# Patient Record
Sex: Female | Born: 1945 | Race: Black or African American | Hispanic: Yes
Health system: Midwestern US, Community
[De-identification: ages and names within clinical notes are randomized; demographics above are authoritative.]

## PROBLEM LIST (undated history)

## (undated) DIAGNOSIS — I10 Essential (primary) hypertension: Secondary | ICD-10-CM

## (undated) DIAGNOSIS — R5381 Other malaise: Secondary | ICD-10-CM

## (undated) DIAGNOSIS — Z0000 Encounter for general adult medical examination without abnormal findings: Secondary | ICD-10-CM

## (undated) DIAGNOSIS — K219 Gastro-esophageal reflux disease without esophagitis: Secondary | ICD-10-CM

## (undated) DIAGNOSIS — T7840XA Allergy, unspecified, initial encounter: Secondary | ICD-10-CM

## (undated) DIAGNOSIS — N186 End stage renal disease: Secondary | ICD-10-CM

## (undated) DIAGNOSIS — I639 Cerebral infarction, unspecified: Secondary | ICD-10-CM

## (undated) DIAGNOSIS — Z9289 Personal history of other medical treatment: Secondary | ICD-10-CM

## (undated) DIAGNOSIS — I252 Old myocardial infarction: Secondary | ICD-10-CM

## (undated) DIAGNOSIS — E119 Type 2 diabetes mellitus without complications: Secondary | ICD-10-CM

## (undated) DIAGNOSIS — F0390 Unspecified dementia without behavioral disturbance: Secondary | ICD-10-CM

## (undated) DIAGNOSIS — E079 Disorder of thyroid, unspecified: Secondary | ICD-10-CM

## (undated) DIAGNOSIS — I509 Heart failure, unspecified: Secondary | ICD-10-CM

## (undated) DIAGNOSIS — N289 Disorder of kidney and ureter, unspecified: Secondary | ICD-10-CM

## (undated) DIAGNOSIS — N19 Unspecified kidney failure: Secondary | ICD-10-CM

## (undated) DIAGNOSIS — M1990 Unspecified osteoarthritis, unspecified site: Secondary | ICD-10-CM

## (undated) DIAGNOSIS — Z992 Dependence on renal dialysis: Secondary | ICD-10-CM

## (undated) DIAGNOSIS — Z931 Gastrostomy status: Secondary | ICD-10-CM

## (undated) DIAGNOSIS — D649 Anemia, unspecified: Secondary | ICD-10-CM

## (undated) DIAGNOSIS — M810 Age-related osteoporosis without current pathological fracture: Secondary | ICD-10-CM

## (undated) DIAGNOSIS — Z8673 Personal history of transient ischemic attack (TIA), and cerebral infarction without residual deficits: Secondary | ICD-10-CM

## (undated) HISTORY — DX: Heart failure, unspecified: I50.9

## (undated) HISTORY — DX: Type 2 diabetes mellitus without complications: E11.9

## (undated) HISTORY — DX: Unspecified kidney failure: N19

## (undated) HISTORY — DX: Personal history of transient ischemic attack (TIA), and cerebral infarction without residual deficits: Z86.73

## (undated) HISTORY — DX: Unspecified osteoarthritis, unspecified site: M19.90

## (undated) HISTORY — PX: ABDOMINAL HYSTERECTOMY: SHX81

## (undated) HISTORY — PX: GALLBLADDER SURGERY: SHX652

## (undated) HISTORY — DX: Anemia, unspecified: D64.9

## (undated) HISTORY — DX: Old myocardial infarction: I25.2

## (undated) HISTORY — DX: Allergy, unspecified, initial encounter: T78.40XA

## (undated) HISTORY — DX: Personal history of other medical treatment: Z92.89

## (undated) HISTORY — DX: Age-related osteoporosis without current pathological fracture: M81.0

---

## 2013-09-20 ENCOUNTER — Encounter (HOSPITAL_COMMUNITY): Payer: Self-pay | Admitting: Emergency Medicine

## 2013-09-20 ENCOUNTER — Emergency Department (HOSPITAL_COMMUNITY)
Admission: EM | Admit: 2013-09-20 | Discharge: 2013-09-20 | Disposition: A | Discharge status: Home / Self Care | Payer: Medicare Other | Attending: Emergency Medicine | Admitting: Emergency Medicine

## 2013-09-20 DIAGNOSIS — Z79899 Other long term (current) drug therapy: Secondary | ICD-10-CM | POA: Insufficient documentation

## 2013-09-20 DIAGNOSIS — E162 Hypoglycemia, unspecified: Secondary | ICD-10-CM

## 2013-09-20 DIAGNOSIS — E1169 Type 2 diabetes mellitus with other specified complication: Secondary | ICD-10-CM | POA: Insufficient documentation

## 2013-09-20 DIAGNOSIS — Z794 Long term (current) use of insulin: Secondary | ICD-10-CM | POA: Insufficient documentation

## 2013-09-20 HISTORY — DX: Type 2 diabetes mellitus without complications: E11.9

## 2013-09-20 LAB — GLUCOSE, CAPILLARY: Glucose-Capillary: 291 mg/dL — ABNORMAL HIGH (ref 70–99)

## 2013-09-20 NOTE — Discharge Instructions (Signed)

## 2013-09-20 NOTE — ED Notes (Signed)
Prior charting by Jorja Loaim , RN

## 2013-09-20 NOTE — ED Notes (Signed)
Her daughter found her mother's blood sugar to be low--called EMS.  They found her cbg to be 27 initially.  They gave her D50 IV and had her eat a peanut butter sandwich, which she was able to eat without issue.  She is awake, alert and orienteed x 4 and in no distress.

## 2013-09-20 NOTE — ED Notes (Signed)
Notified RN, Isaias CowmanAllan pt. CBG [phone].

## 2013-09-20 NOTE — ED Notes (Signed)
Bed: WA14 Expected date:  Expected time:  Means of arrival:  Comments: EMS 

## 2013-09-20 NOTE — ED Provider Notes (Signed)
CSN: [id number]     Arrival date & time 09/20/13  1859 History   First MD Initiated Contact with Patient 09/20/13 1921     Chief Complaint  Patient presents with  . Hypoglycemia   (Consider location/radiation/quality/duration/timing/severity/associated sxs/prior Treatment) HPI  Patient presents to the ER with complaints of hypoglycemia. She recently had her insulin increased because her sugars had been higher than normal. She is supposed to take 12 units before lunch and then 7 units before dinner. She took all 19 at once but did not eat very much today. The daughter came home and saw the she was sleepy and called EMS. When EMS arrived her glucose was was 27. She was given 1 amp of D50 and a peanut butter sandwich which she ate with no difficulties. She is alert and oriented without any complaints at this time.  Past Medical History  Diagnosis Date  . Diabetes mellitus without complication    History reviewed. No pertinent past surgical history. History reviewed. No pertinent family history. History  Substance Use Topics  . Smoking status: Never Smoker   . Smokeless tobacco: Not on file  . Alcohol Use: Not on file   OB History   Grav Para Term Preterm Abortions TAB SAB Ect Mult Living                 Review of Systems The patient denies anorexia, fever, weight loss,, vision loss, decreased hearing, hoarseness, chest pain, syncope, dyspnea on exertion, peripheral edema, balance deficits, hemoptysis, abdominal pain, melena, hematochezia, severe indigestion/heartburn, hematuria, incontinence, genital sores, muscle weakness, suspicious skin lesions, transient blindness, difficulty walking, depression, unusual weight change, abnormal bleeding, enlarged lymph nodes, angioedema, and breast masses.  Allergies  Review of patient's allergies indicates no known allergies.  Home Medications   Current Outpatient Rx  Name  Route  Sig  Dispense  Refill  . amLODipine (NORVASC) 10 MG tablet  Oral   Take 10-15.5 mg by mouth 2 (two) times daily. One in the morning and 1 and half tablets in the evening         . bisacodyl (DULCOLAX) 5 MG EC tablet   Oral   Take 5 mg by mouth daily as needed for moderate constipation.         . carvedilol (COREG) 25 MG tablet   Oral   Take 25 mg by mouth 2 (two) times daily with a meal.         . cholecalciferol (VITAMIN D) 1000 UNITS tablet   Oral   Take 1,000 Units by mouth daily.         Marland Kitchen. Cod Liver Oil 1000 MG CAPS   Oral   Take 1 capsule by mouth daily.         . insulin glargine (LANTUS) 100 UNIT/ML injection   Subcutaneous   Inject 25 Units into the skin at bedtime.         . insulin lispro (HUMALOG) 100 UNIT/ML injection   Subcutaneous   Inject 19 Units into the skin 2 (two) times daily. 12 units before lunch and 7 units before supper         . losartan-hydrochlorothiazide (HYZAAR) 100-25 MG per tablet   Oral   Take 1 tablet by mouth daily.          There were no vitals taken for this visit. Physical Exam  Nursing note and vitals reviewed. Constitutional: She is oriented to person, place, and time. She appears well-developed and well-nourished. No distress.  HENT:  Head: Normocephalic and atraumatic.  Eyes: Pupils are equal, round, and reactive to light.  Neck: Normal range of motion. Neck supple.  Cardiovascular: Normal rate and regular rhythm.   Pulmonary/Chest: Effort normal.  Abdominal: Soft.  Musculoskeletal: Normal range of motion.  Neurological: She is alert and oriented to person, place, and time. She has normal strength.  Skin: Skin is warm and dry.    ED Course  Procedures (including critical care time) Labs Review Labs Reviewed  GLUCOSE, CAPILLARY - Abnormal; Notable for the following:    Glucose-Capillary 291 (*)    All other components within normal limits   Imaging Review No results found.  EKG Interpretation   None       MDM   1. Hypoglycemia     Discussed case with  DR. Romeo Apple, we have a clear cause of why patient became hypoglycemic. She took a newer increased dose of insulin and did not eat today. She responded appropriately to therapy and now feels 100% fine with no complaints. Daughter and patient are both comfortable with patient going home. She has been monitored in the ED for 2 hours.  68 y.o.Angell Weninger's evaluation in the Emergency Department is complete. It has been determined that no acute conditions requiring further emergency intervention are present at this time. The patient/guardian have been advised of the diagnosis and plan. We have discussed signs and symptoms that warrant return to the ED, such as changes or worsening in symptoms.  Vital signs are stable at discharge. There were no vitals filed for this visit.  Patient/guardian has voiced understanding and agreed to follow-up with the PCP or specialist.     Dorthula Matas, PA-C 09/20/13 2041

## 2013-09-21 NOTE — ED Provider Notes (Signed)
Medical screening examination/treatment/procedure(s) were performed by non-physician practitioner and as supervising physician I was immediately available for consultation/collaboration.  EKG Interpretation   None         Junius ArgyleForrest S Marieelena Bartko, MD 09/21/13 1212

## 2013-11-08 ENCOUNTER — Emergency Department (HOSPITAL_COMMUNITY)
Admission: EM | Admit: 2013-11-08 | Discharge: 2013-11-08 | Disposition: A | Discharge status: Home / Self Care | Payer: Medicare Other | Attending: Emergency Medicine | Admitting: Emergency Medicine

## 2013-11-08 ENCOUNTER — Emergency Department (HOSPITAL_COMMUNITY): Payer: Medicare Other

## 2013-11-08 ENCOUNTER — Encounter (HOSPITAL_COMMUNITY): Payer: Self-pay | Admitting: Emergency Medicine

## 2013-11-08 DIAGNOSIS — M25539 Pain in unspecified wrist: Secondary | ICD-10-CM | POA: Insufficient documentation

## 2013-11-08 DIAGNOSIS — Z794 Long term (current) use of insulin: Secondary | ICD-10-CM | POA: Insufficient documentation

## 2013-11-08 DIAGNOSIS — J029 Acute pharyngitis, unspecified: Secondary | ICD-10-CM | POA: Insufficient documentation

## 2013-11-08 DIAGNOSIS — H9209 Otalgia, unspecified ear: Secondary | ICD-10-CM | POA: Insufficient documentation

## 2013-11-08 DIAGNOSIS — Z79899 Other long term (current) drug therapy: Secondary | ICD-10-CM | POA: Insufficient documentation

## 2013-11-08 DIAGNOSIS — E119 Type 2 diabetes mellitus without complications: Secondary | ICD-10-CM | POA: Insufficient documentation

## 2013-11-08 LAB — RAPID STREP SCREEN (MED CTR MEBANE ONLY): Streptococcus, Group A Screen (Direct): NEGATIVE

## 2013-11-08 MED ORDER — TRAMADOL HCL 50 MG PO TABS: 50.0000 mg | ORAL_TABLET | Freq: Four times a day (QID) | ORAL | Status: DC | PRN

## 2013-11-08 MED ORDER — TRAMADOL HCL 50 MG PO TABS
50.0000 mg | ORAL_TABLET | Freq: Once | ORAL | Status: AC
  Administered 2013-11-08: 50 mg via ORAL
  Filled 2013-11-08: qty 1

## 2013-11-08 NOTE — ED Provider Notes (Signed)
CSN: [id number]     Arrival date & time 11/08/13  1825 History  This chart was scribed for non-physician practitioner, Junious Silk, PA-C working with Laray Anger, DO by Luisa Dago, ED scribe. This patient was seen in room WTR8/WTR8 and the patient's care was started at 8:59 PM.      Chief Complaint  Patient presents with  . Wrist Pain    The history is provided by the patient. No language interpreter was used.   HPI Comments: Samantha [name] is a 68 y.o. female who presents to the Emergency Department complaining of right wrist pain that started 2 weeks ago. She describes the pain as achy and exacerbated with movement. Pt is currently in a splint that she bought herself. She denies any recent injury. Reports taking some arthritis medication (OTC-Equate). She is unsure which type of medication this is. Denies any similar episodes. Pt states that she thought that it may be arthritis. Denies any fever, chills, diaphoresis, abdominal pain, nausea, or emesis.  Pt is from [facility] but is in  during the week because she is taking care of her granddaughter.   Pt is requesting a strep test. She says that recently her granddaughter was diagnosed with Strep. She is complaining of a mild sore throat and mild left ear pain which started today. No fevers, abdominal pain, headache. Denies taking any OTC medication to reliever her symptoms.      Past Medical History  Diagnosis Date  . Diabetes mellitus without complication    No past surgical history on file. No family history on file. History  Substance Use Topics  . Smoking status: Never Smoker   . Smokeless tobacco: Not on file  . Alcohol Use: Not on file   OB History   Grav Para Term Preterm Abortions TAB SAB Ect Mult Living                 Review of Systems  Constitutional: Negative for fever, chills and diaphoresis.  HENT: Positive for ear pain and sore throat.   Respiratory: Negative for cough and shortness of breath.    Gastrointestinal: Negative for nausea, vomiting and abdominal pain.  Musculoskeletal: Positive for arthralgias (right wrist).  All other systems reviewed and are negative.      Allergies  Review of patient's allergies indicates no known allergies.  Home Medications   Current Outpatient Rx  Name  Route  Sig  Dispense  Refill  . amLODipine (NORVASC) 10 MG tablet   Oral   Take 10-15.5 mg by mouth 2 (two) times daily. One in the morning and 1 and half tablets in the evening         . bisacodyl (DULCOLAX) 5 MG EC tablet   Oral   Take 5 mg by mouth daily as needed for moderate constipation.         . carvedilol (COREG) 25 MG tablet   Oral   Take 25 mg by mouth 2 (two) times daily with a meal.         . cholecalciferol (VITAMIN D) 1000 UNITS tablet   Oral   Take 1,000 Units by mouth daily.         Marland Kitchen Cod Liver Oil 1000 MG CAPS   Oral   Take 1 capsule by mouth daily.         . insulin glargine (LANTUS) 100 UNIT/ML injection   Subcutaneous   Inject 25 Units into the skin at bedtime.         Marland Kitchen  insulin lispro (HUMALOG) 100 UNIT/ML injection   Subcutaneous   Inject 19 Units into the skin 2 (two) times daily. 12 units before lunch and 7 units before supper         . losartan-hydrochlorothiazide (HYZAAR) 100-25 MG per tablet   Oral   Take 1 tablet by mouth daily.          BP 191/100  Pulse 75  Temp(Src) 98.7 F (37.1 C) (Oral)  Resp 16  SpO2 97%  Physical Exam  Nursing note and vitals reviewed. Constitutional: She is oriented to person, place, and time. She appears well-developed and well-nourished. No distress.  HENT:  Head: Normocephalic and atraumatic.  Right Ear: Tympanic membrane, external ear and ear canal normal.  Left Ear: Tympanic membrane, external ear and ear canal normal.  Nose: Nose normal.  Mouth/Throat: Uvula is midline, oropharynx is clear and moist and mucous membranes are normal. No trismus in the jaw.  Eyes: Conjunctivae are  normal.  Neck: Normal range of motion.  Cardiovascular: Normal rate, regular rhythm and normal heart sounds.   Pulmonary/Chest: Effort normal and breath sounds normal. No stridor. No respiratory distress. She has no wheezes. She has no rales.  Abdominal: Soft. She exhibits no distension.  Musculoskeletal: Normal range of motion.  Tender to palpation over right distal radius. No snuff box tenderness.  Neurological: She is alert and oriented to person, place, and time. She has normal strength. No sensory deficit.  Reflex Scores:      Brachioradialis reflexes are 2+ on the right side and 2+ on the left side. Sensation intact to all fingers of the right hand. Grip strength 5/5 bilaterally.  Skin: Skin is warm and dry. She is not diaphoretic. No erythema.  Psychiatric: She has a normal mood and affect. Her behavior is normal.    ED Course  Procedures (including critical care time)  DIAGNOSTIC STUDIES: Oxygen Saturation is 97% on RA, normal by my interpretation.    COORDINATION OF CARE: 9:05 PM- Will give pt a referral to orthopedic.Pt advised of plan for treatment and pt agrees.  Labs Review Labs Reviewed  RAPID STREP SCREEN  CULTURE, GROUP A STREP   Imaging Review Dg Wrist Complete Right  11/08/2013   CLINICAL DATA:  Right wrist pain without known injury.  EXAM: RIGHT WRIST - COMPLETE 3+ VIEW  COMPARISON:  None.  FINDINGS: Spurring at the first carpometacarpal junction noted. Well corticated fragmented spur along the medial margin of the joint - the loose fragment within the joint not excluded.  Degenerative findings at the first metacarpophalangeal joint with some slight ulnar subluxation of the proximal phalanx with respect to the first metacarpal.  IMPRESSION: 1. Degenerative spurring at the first carpometacarpal joint, with a chronic appearing fragmented osteophyte medially which might be loose within the joint. 2. Degenerative findings at the first metacarpophalangeal joint.    Electronically Signed   By: Herbie Baltimore M.D.   On: 11/08/2013 19:24     EKG Interpretation None      MDM   Final diagnoses:  Wrist pain   Patient presents to ED with wrist pain. XR shows degenerative spurring at the first carpometacarpal joint, with chronic appearing fragmented osteophyte medially which might be loose within the joint. Also degenerative findings at the first metacarpophalangeal joint. This is likely contributing to her pain. Discussed importance of PCP follow up. Patient given a referral to hand.  Compartment soft. Neurovascularly intact. Return instructions given. Vital signs stable for discharge. Patient / Family /  Caregiver informed of clinical course, understand medical decision-making process, and agree with plan.  I personally performed the services described in this documentation, which was scribed in my presence. The recorded information has been reviewed and is accurate.    Mora BellmanHannah S Adison Reifsteck, PA-C 11/09/13 1523

## 2013-11-08 NOTE — Discharge Instructions (Signed)
Wrist Pain °A wrist sprain happens when the bands of tissue that hold the wrist joints together (ligament) stretch too much or tear. A wrist strain happens when muscles or bands of tissue that connect muscles to bones (tendons) are stretched or pulled. °HOME CARE °· Put ice on the injured area. °· Put ice in a plastic bag. °· Place a towel between your skin and the bag. °· Leave the ice on for 15-20 minutes, 03-04 times a day, for the first 2 days. °· Raise (elevate) the injured wrist to lessen puffiness (swelling). °· Rest the injured wrist for at least 48 hours or as told by your doctor. °· Wear a splint, cast, or an elastic wrap as told by your doctor. °· Only take medicine as told by your doctor. °· Follow up with your doctor as told. This is important. °GET HELP RIGHT AWAY IF:  °· The fingers are puffy, very red, white, or cold and blue. °· The fingers lose feeling (numb) or tingle. °· The pain gets worse. °· It is hard to move the fingers. °MAKE SURE YOU:  °· Understand these instructions. °· Will watch your condition. °· Will get help right away if you are not doing well or get worse. °Document Released: 01/27/2008 Document Revised: 11/02/2011 Document Reviewed: 10/01/2010 °Exit[employer]® Patient Information ©2014 [employer]. ° °

## 2013-11-08 NOTE — ED Notes (Signed)
Pt c/o rt wrist pain x 2 wks.  Denies injury.  Also wants to be checked for strep throat because her throat is starting to hurt and her grandchild was dx with strep.

## 2013-11-10 LAB — CULTURE, GROUP A STREP

## 2013-11-10 NOTE — ED Provider Notes (Signed)
Medical screening examination/treatment/procedure(s) were performed by non-physician practitioner and as supervising physician I was immediately available for consultation/collaboration.   EKG Interpretation None        Kunta Hilleary M Shivan Hodes, DO 11/10/13 1433 

## 2014-04-27 IMAGING — CR DG WRIST COMPLETE 3+V*R*
4 series · 4 of 4 positions shown · non-contrast
Comparison: None.

CLINICAL DATA: Right wrist pain without known injury.

EXAM:
RIGHT WRIST - COMPLETE 3+ VIEW

[x wrist pa right]
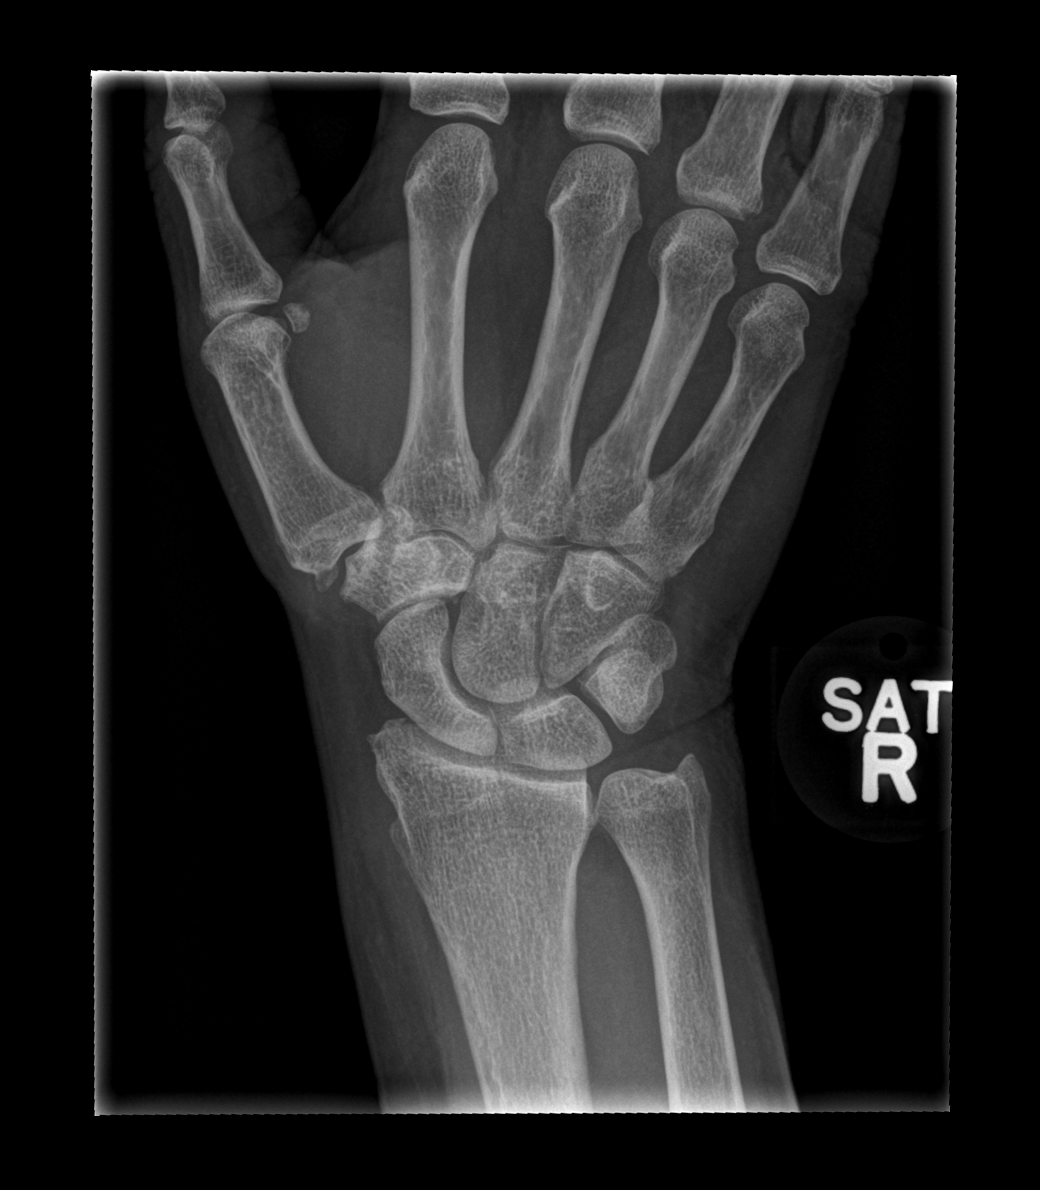

[x wrist obl right]
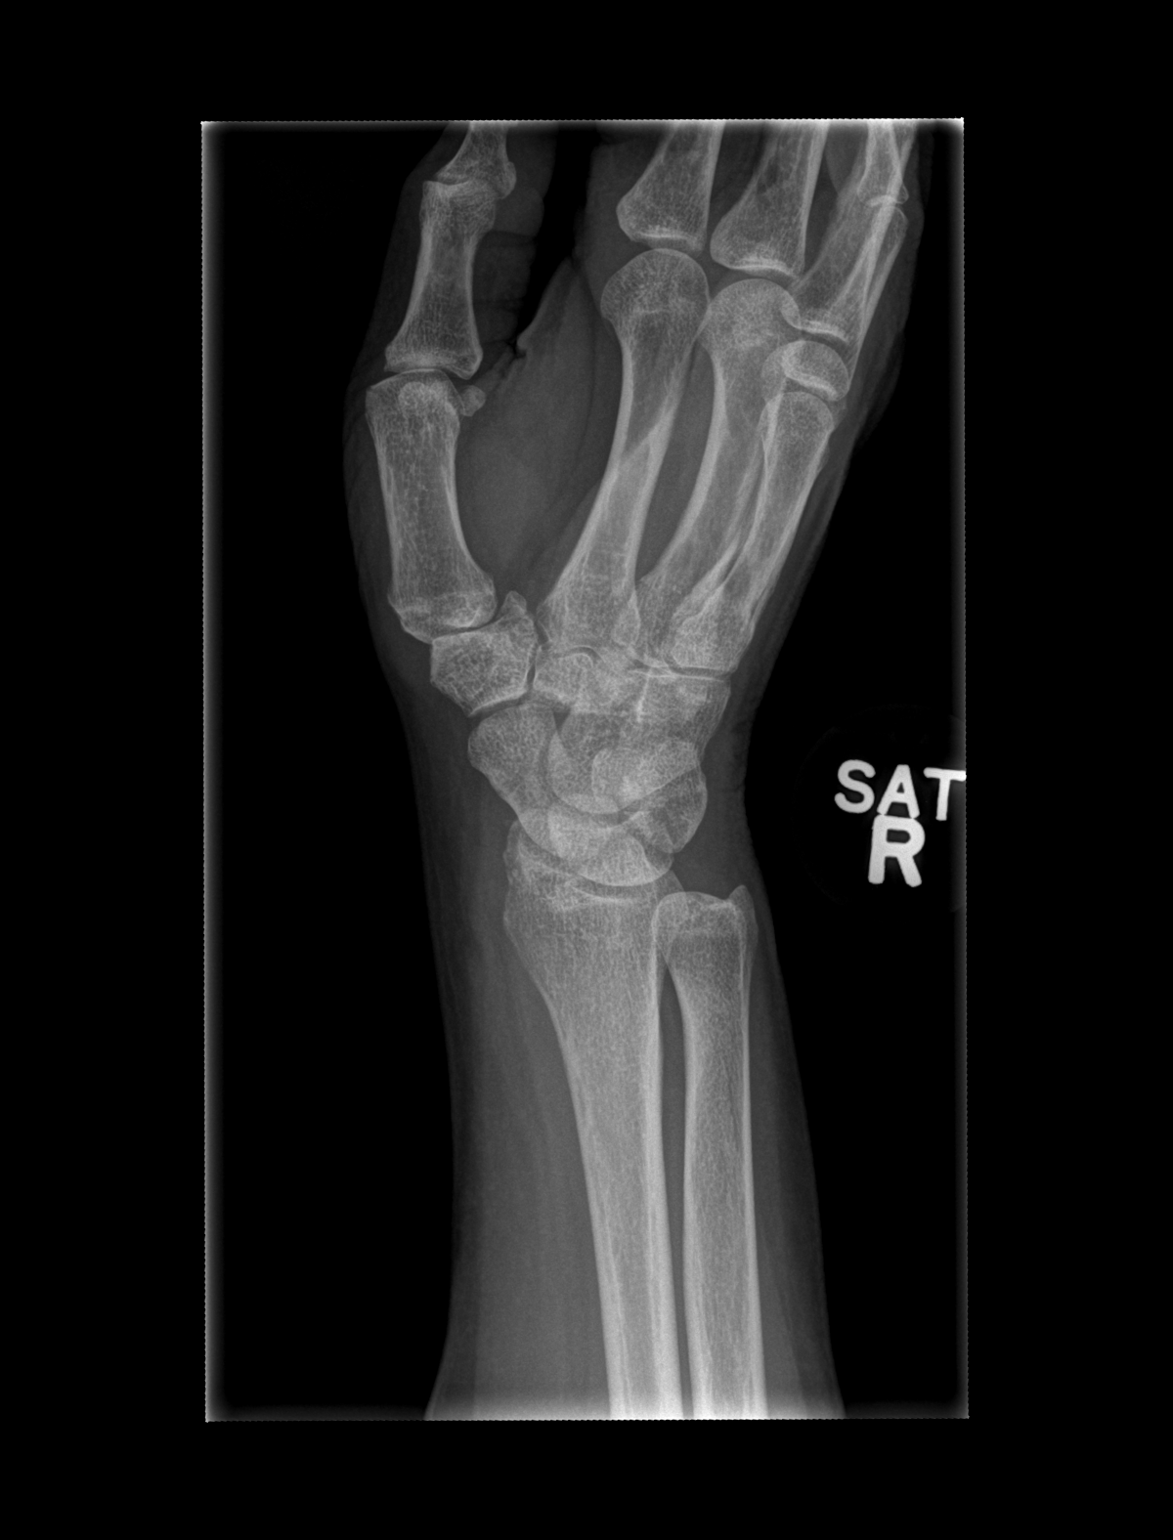

[x wrist lat right]
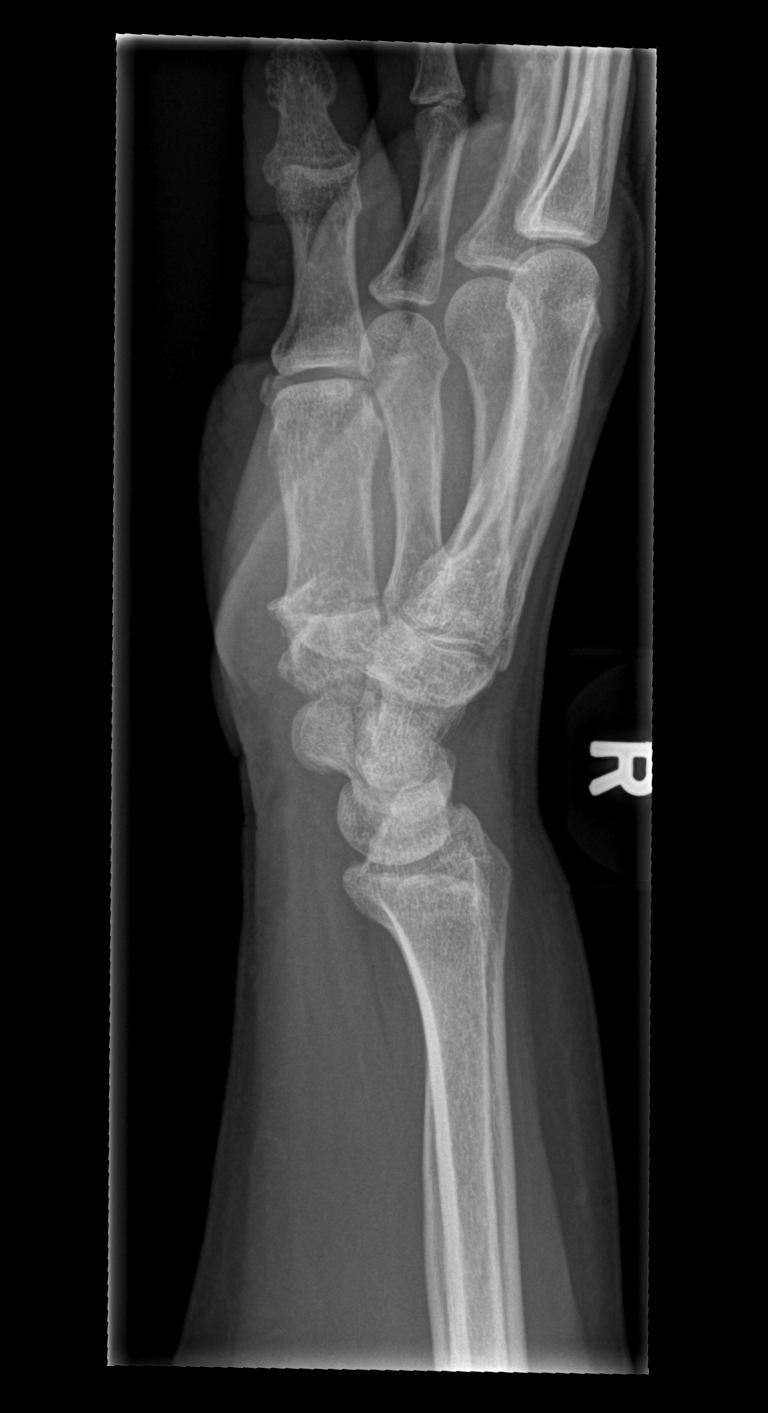

[x wrist navicular view right]
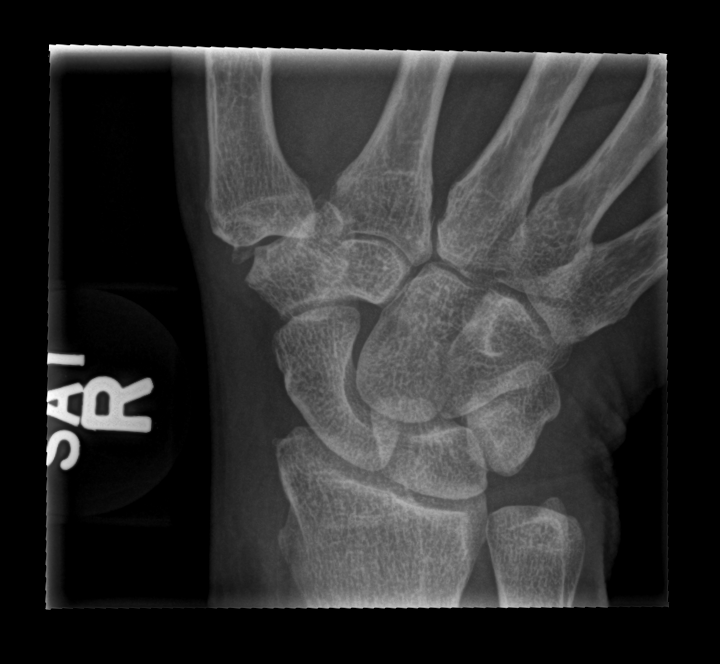

[4 of 4 positions shown; findings below may reference images not displayed]

FINDINGS: Spurring at the first carpometacarpal junction noted. Well
corticated fragmented spur along the medial margin of the joint -
the loose fragment within the joint not excluded.

Degenerative findings at the first metacarpophalangeal joint with
some slight ulnar subluxation of the proximal phalanx with respect
to the first metacarpal.
IMPRESSION: 1. Degenerative spurring at the first carpometacarpal joint, with a
chronic appearing fragmented osteophyte medially which might be
loose within the joint.
2. Degenerative findings at the first metacarpophalangeal joint.

## 2017-05-14 NOTE — Progress Notes (Signed)
L/M on Ashley Drake's phone to ask if she were new to us.  I am trying to work on a PA for her, not able to because I  Don't see any past history.  cbg

## 2017-05-18 ENCOUNTER — Ambulatory Visit: Admit: 2017-05-18 | Discharge: 2017-05-18 | Payer: MEDICARE | Attending: Family Medicine | Primary: Family Medicine

## 2017-05-18 DIAGNOSIS — I10 Essential (primary) hypertension: Secondary | ICD-10-CM

## 2017-05-18 LAB — AMB POC URINE, MICROALBUMIN, SEMIQUANT (3 RESULTS)
ALBUMIN, URINE POC: 150 mg/L
CREATININE, URINE POC: 100 mg/dL
Microalbumin/creat ratio (POC): >300 MG/G (ref <30)

## 2017-05-18 MED ORDER — ISOSORBIDE MONONITRATE SR 60 MG 24 HR TAB
60 mg | ORAL_TABLET | ORAL | 0 refills | Status: DC
Start: 2017-05-18 — End: 2017-10-04

## 2017-05-18 MED ORDER — CARVEDILOL 25 MG TAB
25 mg | ORAL_TABLET | Freq: Two times a day (BID) | ORAL | 0 refills | Status: DC
Start: 2017-05-18 — End: 2017-07-09

## 2017-05-18 MED ORDER — AMLODIPINE 10 MG TAB
10 mg | ORAL_TABLET | Freq: Every day | ORAL | 0 refills | Status: DC
Start: 2017-05-18 — End: 2017-07-27

## 2017-05-18 MED ORDER — PANTOPRAZOLE 40 MG TAB, DELAYED RELEASE
40 mg | ORAL_TABLET | Freq: Every day | ORAL | 0 refills | Status: DC
Start: 2017-05-18 — End: 2018-02-01

## 2017-05-18 NOTE — Progress Notes (Signed)
Subjective:      Ashley Drake is a 71 y.o. female with history of hypertension, DM, TTP, CVA who presents for initial visit.   Comes in with her daughter's today.   Issues discussed today     Hypertension   Is currently on Amlodipine 10 mg and Coreg 25 mg daily  Does measure BP at home - does not bring in log today.   Diet  Does try and follow a low salt diet   Exercise - does try and walk daily   Tolerating medications well, has not noticed any side effects.   Sees Dr Noland Fordyce, Cardiology       DM   Is currently on Novolog sliding scale   Doe not recall her last A1c   Is not on any oral medication for DM.     TTP   Followed by Dr Carmell Austria, Hematology   Is currently on Prednisone.   Last appointment - Sept 21st      [facility] discharged from [facility].   Had been admitted to [facility] for CVA.           Review of Systems   Constitutional: Negative for chills and fever.   HENT: Negative for ear discharge and ear pain.    Respiratory: Negative for cough and shortness of breath.    Cardiovascular: Negative for chest pain and palpitations.   Gastrointestinal: Negative for abdominal pain and vomiting.   Genitourinary: Negative for dysuria and frequency.   Skin: Negative for itching and rash.   Neurological: Negative for dizziness and headaches.         PMHx:  Past Medical History:   Diagnosis Date   ??? Diabetes (HCC)    ??? Hypertension    ??? T.T.P. syndrome (HCC)        Meds:       Allergies:   Not on File    Smoker:  History   Smoking Status   ??? Never Smoker   Smokeless Tobacco   ??? Never Used       ETOH:   History   Alcohol Use No       FH: No family history on file.      Objective:     Visit Vitals   ??? BP 115/66 (BP 1 Location: Left arm, BP Patient Position: Sitting)   ??? Pulse 75   ??? Temp 98.4 ??F (36.9 ??C) (Oral)   ??? Resp 18   ??? Ht  (1.499 m)   ??? Wt 108 lb 12.8 oz (49.4 kg)   ??? SpO2 99%   ??? BMI 21.97 kg/m2       Physical Examination:   GEN: No apparent distress. Alert and oriented and responds to all  questions appropriately. In wheelchair  OROPHYARYNX: No oral lesions or exudates.  NECK:  Supple; no masses; thyroid normal           LUNGS: Respirations unlabored; clear to auscultation bilaterally  CARDIOVASCULAR: Regular, rate, and rhythm without murmurs, gallops or rubs   ABDOMEN: Soft; nontender; nondistended;  SKIN: No obvious rashes.      Assessment:       ICD-10-CM ICD-9-CM    1. Essential hypertension I10 401.9 amLODIPine (NORVASC) 10 mg tablet      carvedilol (COREG) 25 mg tablet      CBC W/O DIFF      METABOLIC PANEL, COMPREHENSIVE      HEMOGLOBIN A1C WITH EAG      AMB POC URINE, MICROALBUMIN,  SEMIQUANT (3 RESULTS)      CANCELED: LIPID PANEL   2. Type 2 diabetes mellitus with complication, with long-term current use of insulin (HCC) E11.8 250.90 HEMOGLOBIN A1C WITH EAG    Z79.4 V58.67    3. Gastroesophageal reflux disease without esophagitis K21.9 530.81 pantoprazole (PROTONIX) 40 mg tablet      isosorbide mononitrate ER (IMDUR) 60 mg CR tablet           Plan:       - Follow up labs as above   - Continue Amlodipine and Coreg for BP   - Counseled on DASH diet and exercise   - Will obtain records from prior provider and recent hospital stay   - Continue sliding scale for DM - will make adjustment based on labs   - Continue Protonix, counseled on side effects of medication       Patient is counseled to return to the office if symptoms do not improve as expected.  Urgent consultation with the nearest Emergency Department is strongly recommended if condition worsens.  Patient is counseled to follow up as recommended and to inform the office if any changes in treatment are recommended.            Signed By:  Leanora Ivanoff, MD    Family Medicine Resident

## 2017-05-18 NOTE — Progress Notes (Signed)
I reviewed with the resident the medical history and the resident's findings on the physical examination.  I discussed with the resident the patient's diagnosis and concur with the plan.

## 2017-05-18 NOTE — Telephone Encounter (Signed)
As patient was leaving office today she and her daughter asked if their was any alternative to sticking her finger multiple times a day for her blood sugars. She states that patient used to wear a wrist band? that checked her sugars so her fingers wouldn't get stuck constantly. I told her I would have to ask Dr. Raleigh CallasSharma. Please let me know if I need to contact patient with anymore information.

## 2017-05-18 NOTE — Progress Notes (Signed)
Ashley Drake is a 71 y.o. female  Chief Complaint   Patient presents with   ??? Establish Care     states previously seeing a provider in [place]   ??? Medication Refill     Visit Vitals   ??? BP 115/66 (BP 1 Location: Left arm, BP Patient Position: Sitting)   ??? Pulse 75   ??? Temp 98.4 ??F (36.9 ??C) (Oral)   ??? Resp 18   ??? Ht 4\' 11"  (1.499 m)   ??? Wt 108 lb 12.8 oz (49.4 kg)   ??? SpO2 99%   ??? BMI 21.97 kg/m2     1. Have you been to the ER, urgent care clinic since your last visit?  Hospitalized since your last visit? No    2. Have you seen or consulted any other health care providers outside of the [facility] since your last visit?  Include any pap smears or colon screening. Seeing doctor for TTP.  Health Maintenance Due   Topic Date Due   ??? Hepatitis C Screening  12/22/1945   ??? DTaP/Tdap/Td series (1 - Tdap) 09/05/1967   ??? Shingrix Vaccine Age 62> (1 of 2) 09/04/1996   ??? BREAST CANCER SCRN MAMMOGRAM  09/04/1996   ??? FOBT Q 1 YEAR AGE 38-75  09/04/1996   ??? GLAUCOMA SCREENING Q2Y  09/05/2011   ??? Bone Densitometry (Dexa) Screening  09/05/2011   ??? Pneumococcal 65+ Low/Medium Risk (1 of 2 - PCV13) 09/05/2011   ??? Influenza Age 78 to Adult  03/24/2017

## 2017-05-19 LAB — METABOLIC PANEL, COMPREHENSIVE
A-G Ratio: 1.7 (ref 1.2–2.2)
ALT (SGPT): 12 IU/L (ref 0–32)
AST (SGOT): 27 IU/L (ref 0–40)
Albumin: 3.8 g/dL (ref 3.5–4.8)
Alk. phosphatase: 53 IU/L (ref 39–117)
BUN/Creatinine ratio: 16 (ref 12–28)
BUN: 39 mg/dL — ABNORMAL HIGH (ref 8–27)
Bilirubin, total: <0.2 mg/dL (ref 0.0–1.2)
CO2: 26 mmol/L (ref 20–29)
Calcium: 9.5 mg/dL (ref 8.7–10.3)
Chloride: 105 mmol/L (ref 96–106)
Creatinine: 2.43 mg/dL — ABNORMAL HIGH (ref 0.57–1.00)
GFR est AA: 23 mL/min/{1.73_m2} — ABNORMAL LOW (ref >59)
GFR est non-AA: 20 mL/min/{1.73_m2} — ABNORMAL LOW (ref >59)
GLOBULIN, TOTAL: 2.2 g/dL (ref 1.5–4.5)
Glucose: 123 mg/dL — ABNORMAL HIGH (ref 65–99)
Potassium: 4.4 mmol/L (ref 3.5–5.2)
Protein, total: 6 g/dL (ref 6.0–8.5)
Sodium: 145 mmol/L — ABNORMAL HIGH (ref 134–144)

## 2017-05-19 LAB — CBC W/O DIFF
HCT: 35.3 % (ref 34.0–46.6)
HGB: 11.2 g/dL (ref 11.1–15.9)
MCH: 26.4 pg — ABNORMAL LOW (ref 26.6–33.0)
MCHC: 31.7 g/dL (ref 31.5–35.7)
MCV: 83 fL (ref 79–97)
PLATELET: 152 10*3/uL (ref 150–379)
RBC: 4.24 x10E6/uL (ref 3.77–5.28)
RDW: 15.8 % — ABNORMAL HIGH (ref 12.3–15.4)
WBC: 4.8 10*3/uL (ref 3.4–10.8)

## 2017-05-19 LAB — CKD REPORT

## 2017-05-19 LAB — HEMOGLOBIN A1C WITH EAG
Estimated average glucose: 120 mg/dL
Hemoglobin A1c: 5.8 % — ABNORMAL HIGH (ref 4.8–5.6)

## 2017-05-19 LAB — DIABETES PATIENT EDUCATION

## 2017-05-23 NOTE — Telephone Encounter (Signed)
Daughter called in to say Mother BG reading was 434 in which she was given 18 units of insulin (Novolog Flex pen) rechecked an hour after down to 371 and rechecked again after two hours 178. Daughter Burman Blacksmith) then states that patient didn't have a prescription for her Novolog Pen, Reli-on Strips and needles, folic acid, Magoxid 400 mg twice daily, and was unable to get Isosorbide 60 mg. Called the pharmacy and Isosorbide is ready for pick up now.

## 2017-05-24 ENCOUNTER — Telehealth

## 2017-05-24 MED ORDER — MAGNESIUM OXIDE 400 MG TAB
400 mg | ORAL_TABLET | Freq: Two times a day (BID) | ORAL | 0 refills | Status: DC
Start: 2017-05-24 — End: 2017-10-04

## 2017-05-24 MED ORDER — VITAMIN B COMPLEX #3-FOLIC ACID-VIT C-BIOTIN 1 MG-60 MG-300 MCG TAB
1-60-300 mg-mg-mcg | ORAL_TABLET | Freq: Every day | ORAL | 0 refills | Status: AC
Start: 2017-05-24 — End: ?

## 2017-05-24 MED ORDER — NOVOLOG FLEXPEN U-100 INSULIN ASPART 100 UNIT/ML (3 ML) SUBCUTANEOUS
100 unit/mL (3 mL) | SUBCUTANEOUS | 1 refills | Status: DC
Start: 2017-05-24 — End: 2017-05-25

## 2017-05-24 MED ORDER — BLOOD SUGAR DIAGNOSTIC TEST STRIPS
ORAL_STRIP | 2 refills | Status: DC
Start: 2017-05-24 — End: 2017-05-25

## 2017-05-24 NOTE — Telephone Encounter (Signed)
05/19/2017 effective: 05/19/2017 expires: 05/18/2018] Carson Myrtle [phone]

## 2017-05-24 NOTE — Telephone Encounter (Signed)
-----   [facility] sent at 05/24/2017 12:12 PM EDT -----  Regarding: Dr. Charlynn GrimesSharma/Telephone  Pt daughter Renelda MomDori Kasper [phone] says that the pt [facility] for PT, ST, OT. She says that [facility] that she [facility] this for the new month.

## 2017-05-24 NOTE — Telephone Encounter (Signed)
Hi Dr. Raleigh Callas,  The insurance company that authorizes her Procrit has been rejected.  Please fax to [facility] something comparable.  Thx,  Dorean Daniello

## 2017-05-26 MED ORDER — NOVOLOG FLEXPEN U-100 INSULIN ASPART 100 UNIT/ML (3 ML) SUBCUTANEOUS
100 unit/mL (3 mL) | SUBCUTANEOUS | 1 refills | Status: DC
Start: 2017-05-26 — End: 2017-06-24

## 2017-05-26 MED ORDER — BLOOD SUGAR DIAGNOSTIC TEST STRIPS
ORAL_STRIP | 2 refills | Status: AC
Start: 2017-05-26 — End: ?

## 2017-05-26 NOTE — Telephone Encounter (Signed)
Called and discussed labs with daughter.   Will refer to Dr Westley Chandler, Nephrology for CKD. Daughter will call to make an appointment.   Follow up as scheduled.

## 2017-05-26 NOTE — Progress Notes (Signed)
Elevated Cr  A1c-5.8

## 2017-05-27 NOTE — Telephone Encounter (Signed)
The insurance company that authorizes her Procrit has been rejected.  [facility].  Thx,  Caelin Rayl   ??

## 2017-06-02 ENCOUNTER — Ambulatory Visit: Admit: 2017-06-02 | Discharge: 2017-06-02 | Payer: MEDICARE | Attending: Family Medicine | Primary: Family Medicine

## 2017-06-02 DIAGNOSIS — IMO0001 Reserved for inherently not codable concepts without codable children: Secondary | ICD-10-CM

## 2017-06-02 NOTE — Progress Notes (Signed)
I reviewed with the resident the medical history and the resident's findings on the physical examination.  I discussed with the resident the patient's diagnosis and concur with the plan.

## 2017-06-02 NOTE — Patient Instructions (Signed)
Learning About Diabetes Food Guidelines  Your Care Instructions    Meal planning is important to manage diabetes. It helps keep your blood sugar at a target level (which you set with your doctor). You don't have to eat special foods. You can eat what your family eats, including sweets once in a while. But you do have to pay attention to how often you eat and how much you eat of certain foods.  You may want to work with a dietitian or a certified diabetes educator (CDE) to help you plan meals and snacks. A dietitian or CDE can also help you lose weight if that is one of your goals.  What should you know about eating carbs?  Managing the amount of carbohydrate (carbs) you eat is an important part of healthy meals when you have diabetes. Carbohydrate is found in many foods.  ?? Learn which foods have carbs. And learn the amounts of carbs in different foods.  ?? Bread, cereal, pasta, and rice have about 15 grams of carbs in a serving. A serving is 1 slice of bread (1 ounce), ?? cup of cooked cereal, or 1/3 cup of cooked pasta or rice.  ?? Fruits have 15 grams of carbs in a serving. A serving is 1 small fresh fruit, such as an apple or orange; ?? of a banana; ?? cup of cooked or canned fruit; ?? cup of fruit juice; 1 cup of melon or raspberries; or 2 tablespoons of dried fruit.  ?? Milk and no-sugar-added yogurt have 15 grams of carbs in a serving. A serving is 1 cup of milk or 2/3 cup of no-sugar-added yogurt.  ?? Starchy vegetables have 15 grams of carbs in a serving. A serving is ?? cup of mashed potatoes or sweet potato; 1 cup winter squash; ?? of a small baked potato; ?? cup of cooked beans; or ?? cup cooked corn or green peas.  ?? Learn how much carbs to eat each day and at each meal. A dietitian or CDE can teach you how to keep track of the amount of carbs you eat. This is called carbohydrate counting.  ?? If you are not sure how to count carbohydrate grams, use the Plate  Method to plan meals. It is a good, quick way to make sure that you have a balanced meal. It also helps you spread carbs throughout the day.  ?? Divide your plate by types of foods. Put non-starchy vegetables on half the plate, meat or other protein food on one-quarter of the plate, and a grain or starchy vegetable in the final quarter of the plate. To this you can add a small piece of fruit and 1 cup of milk or yogurt, depending on how many carbs you are supposed to eat at a meal.  ?? Try to eat about the same amount of carbs at each meal. Do not "save up" your daily allowance of carbs to eat at one meal.  ?? Proteins have very little or no carbs per serving. Examples of proteins are beef, chicken, turkey, fish, eggs, tofu, cheese, cottage cheese, and peanut butter. A serving size of meat is 3 ounces, which is about the size of a deck of cards. Examples of meat substitute serving sizes (equal to 1 ounce of meat) are 1/4 cup of cottage cheese, 1 egg, 1 tablespoon of peanut butter, and ?? cup of tofu.  How can you eat out and still eat healthy?  ?? Learn to estimate the serving sizes of foods   that have carbohydrate. If you measure food at home, it will be easier to estimate the amount in a serving of restaurant food.  ?? If the meal you order has too much carbohydrate (such as potatoes, corn, or baked beans), ask to have a low-carbohydrate food instead. Ask for a salad or green vegetables.  ?? If you use insulin, check your blood sugar before and after eating out to help you plan how much to eat in the future.  ?? If you eat more carbohydrate at a meal than you had planned, take a walk or do other exercise. This will help lower your blood sugar.  What else should you know?  ?? Limit saturated fat, such as the fat from meat and dairy products. This is a healthy choice because people who have diabetes are at higher risk of heart disease. So choose lean cuts of meat and nonfat or low-fat dairy  products. Use olive or canola oil instead of butter or shortening when cooking.  ?? Don't skip meals. Your blood sugar may drop too low if you skip meals and take insulin or certain medicines for diabetes.  ?? Check with your doctor before you drink alcohol. Alcohol can cause your blood sugar to drop too low. Alcohol can also cause a bad reaction if you take certain diabetes medicines.  Follow-up care is a key part of your treatment and safety. Be sure to make and go to all appointments, and call your doctor if you are having problems. It's also a good idea to know your test results and keep a list of the medicines you take.  Where can you learn more?  Go to [employer].  Enter [phone] in the search box to learn more about "Learning About Diabetes Food Guidelines."  Current as of: July 30, 2016  Content Version: 11.8  ?? 2006-2018 [employer]. Care instructions adapted under license by [employer] (which disclaims liability or warranty for this information). If you have questions about a medical condition or this instruction, always ask your healthcare professional. [employer], Inc[employer] disclaims any warranty or liability for your use of this information.       Diabetes Blood Sugar Emergencies: Your Action Plan  How can you prevent a blood sugar emergency?  An important part of living with diabetes is keeping your blood sugar in your target range. You'll need to know what to do if it's too high or too low. Managing your blood sugar levels helps you avoid emergencies. This care sheet will teach you about the signs of high and low blood sugar. It will help you make an action plan with your doctor for when these signs occur.  Low blood sugar is more likely to happen if you take certain medicines for diabetes. It can also happen if you skip a meal, drink alcohol, or exercise more than usual.   You may get high blood sugar if you eat differently than you normally do. One example is eating more carbohydrate than usual. Having a cold, the flu, or other sudden illness can also cause high blood sugar levels. Levels can also rise if you miss a dose of medicine.  Any change in how you take your medicine may affect your blood sugar level. So it's important to work with your doctor before you make any changes.  Check your blood sugar  Work with your doctor to fill in the blank spaces below that apply to you.  Track your levels, know your target range, and write  down ways you can get your blood sugar back in your target range. A log book can help you track your levels. Take the book to all of your medical appointments.  ?? Check your blood sugar _____ times a day, at these times:________________________________________________.  (For example: Before meals, at bedtime, before exercise, during exercise, other.)  ?? Your blood sugar target range before a meal is ___________________. Your blood sugar target range after a meal is _______________________.  ?? Do this???___________________________________________________???to get your blood sugar back within your safe range if your blood sugar results are _________________________________________. (For example: Less than 70 or above 250 mg/dL.)  Call your doctor when your blood sugar results are ___________________________________.  (For example: Less than 70 or above 250 mg/dL.)  What are the symptoms of low and high blood sugar?  Common symptoms of low blood sugar are sweating and feeling shaky, weak, hungry, or confused. Symptoms can start quickly.  Common symptoms of high blood sugar are feeling very thirsty or very hungry. You may also pass urine more often than usual. You may have blurry vision and may lose weight without trying.  But some people may have high or low blood sugar without having any symptoms. That's a good reason to check your blood sugar on a regular  schedule.  What should you do if you have symptoms?  Work with your doctor to fill in the blank spaces below that apply to you.  Low blood sugar  If you have symptoms of low blood sugar, check your blood sugar. If it's below _____ ( for example, below 70), eat or drink a quick-sugar food that has about 15 grams of carbohydrate. Your goal is to get your level back to your safe range. Check your blood sugar again 15 minutes later. If it's still not in your target range, take another 15 grams of carbohydrate and check your blood sugar again in 15 minutes. Repeat this until you reach your target. Then go back to your regular testing schedule.  When you have low blood sugar, it's best to stop or reduce any physical activity until your blood sugar is back in your target range and is stable. If you must stay active, eat or drink 30 grams of carbohydrate. Then check your blood sugar again in 15 minutes. If it's not in your target range, take another 30 grams of carbohydrates. Check your blood sugar again in 15 minutes. Keep doing this until you reach your target. You can then go back to your regular testing schedule.  If your symptoms or blood sugar levels are getting worse or have not improved after 15 minutes, seek medical care right away.  Here are some examples of quick-sugar foods with 15 grams of carbohydrate:  ?? 3 or 4 glucose tablets  ?? 1 tube of glucose gel  ?? Hard candy (such as 3 Jolly Ranchers or 5 to [employer])  ?? ?? cup to ?? cup (4 to 6 ounces) of fruit juice or regular (not diet) soda  High blood sugar  If you have symptoms of high blood sugar, check your blood sugar. Your goal is to get your level back to your target range. If it's above ______ ( for example, above 250), follow these steps:  ?? If you missed a dose of your diabetes medicine, take it now. Take only the amount of medicine that you have been prescribed. Do not take more or less medicine.   ?? Give yourself insulin if your doctor has  prescribed it for high blood sugar.  ?? Test for ketones, if the doctor told you to do so. If the results of the ketone test show a moderate-to-large amount of ketones, call the doctor for advice.  ?? Wait 30 minutes after you take the extra insulin or the missed medicine. Check your blood sugar again.  If your symptoms or blood sugar levels are getting worse or have not improved after taking these steps, seek medical care right away.  Follow-up care is a key part of your treatment and safety. Be sure to make and go to all appointments, and call your doctor if you are having problems. It's also a good idea to know your test results and keep a list of the medicines you take.  Where can you learn more?  Go to [employer].  Enter [phone] in the search box to learn more about "Diabetes Blood Sugar Emergencies: Your Action Plan."  Current as of: July 30, 2016  Content Version: 11.8  ?? 2006-2018 [employer]. Care instructions adapted under license by [employer] (which disclaims liability or warranty for this information). If you have questions about a medical condition or this instruction, always ask your healthcare professional. [employer], [employer] disclaims any warranty or liability for your use of this information.

## 2017-06-02 NOTE — Progress Notes (Signed)
Chief Complaint   Patient presents with   ??? Follow-up     Diabetes

## 2017-06-02 NOTE — Progress Notes (Signed)
Subjective:      Ashley Drake is a 71 y.o. female who presents for follow up of diabetes.. Comes in with both daughter's today.  Is currently on sliding scale insulin. Was prescribed on discharge from hospital.   Not on any other medications for DM.   Does not bring in home glucose log today, has not had any hypoglycemic episodes.   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   Sess Dr Carmell AustriaSchunn, Hematology.   A1c- 5.8.    Due for eye exam.   CKD Stage 4 -[facility].      Review of Systems   Constitutional: Negative for chills and fever.   Eyes: Negative for blurred vision and double vision.   Respiratory: Negative for cough and shortness of breath.    Cardiovascular: Negative for chest pain and palpitations.   Gastrointestinal: Negative for nausea and vomiting.   Skin: Negative for itching and rash.   Neurological: Negative for dizziness and headaches.         PMHx:  Past Medical History:   Diagnosis Date   ??? Diabetes (HCC)    ??? Hypertension    ??? T.T.P. syndrome (HCC)        Meds:   Current Outpatient Prescriptions   Medication Sig Dispense Refill   ??? fluconazole (DIFLUCAN) 50 mg tablet      ??? predniSONE (DELTASONE) 5 mg tablet      ??? predniSONE (DELTASONE) 10 mg tablet      ??? NOVOLOG FLEXPEN U-100 INSULIN 100 unit/mL inpn Please use as instructed 3 Adjustable Dose Pre-filled Pen Syringe 1   ??? glucose blood VI test strips (RELION PRIME TEST STRIPS) strip Please check blood sugars 4 times a day 200 Strip 2   ??? magnesium oxide (MAG-OX) 400 mg (241.3 mg magnesium) tablet Take 1 Tab by mouth two (2) times a day. 180 Tab 0   ??? vit B Cmplx 3-FA-Vit C-Biotin (NEPHRO VITE RX) 1-60-300 mg-mg-mcg tablet Take 1 Tab by mouth daily. 90 Tab 0   ??? amLODIPine (NORVASC) 10 mg tablet Take 1 Tab by mouth daily. 90 Tab 0   ??? pantoprazole (PROTONIX) 40 mg tablet Take 1 Tab by mouth daily. 90 Tab 0   ??? carvedilol (COREG) 25 mg tablet Take 1 Tab by mouth two (2) times daily  (with meals). 90 Tab 0   ??? isosorbide mononitrate ER (IMDUR) 60 mg CR tablet Take 1 Tab by mouth every morning. 90 Tab 0   ??? bumetanide (BUMEX) 1 mg tablet      ??? bumetanide (BUMEX) 0.5 mg tablet          Allergies:   Not on File    Smoker:  History   Smoking Status   ??? Never Smoker   Smokeless Tobacco   ??? Never Used       ETOH:   History   Alcohol Use No       FH: No family history on file.      Objective:     Visit Vitals   ??? BP 133/77   ??? Pulse 70   ??? Ht 4\' 11"  (1.499 m)   ??? Wt 108 lb (49 kg)   ??? SpO2 99%   ??? BMI 21.81 kg/m2     Physical Exam   Constitutional: She is oriented to person, place, and time and well-developed, well-nourished, and in no distress. No distress.   HENT:   Head: Normocephalic  and atraumatic.   Cardiovascular: Normal rate, regular rhythm and normal heart sounds.    Pulmonary/Chest: Effort normal and breath sounds normal. No respiratory distress. She has no wheezes.   Neurological: She is alert and oriented to person, place, and time.   Skin: She is not diaphoretic.         Assessment:       ICD-10-CM ICD-9-CM    1. 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 Debility R53.81 799.3 REFERRAL TO PHYSICAL THERAPY      REFERRAL TO OCCUPATIONAL THERAPY      REFERRAL TO SPEECH THERAPY   3. Encounter for immunization Z23 V03.89 INFLUENZA VACCINE INACTIVATED (IIV), SUBUNIT, ADJUVANTED, IM      ADMIN INFLUENZA VIRUS VAC   4. Screening for breast cancer Z12.31 V76.10 MAM MAMMO BI SCREENING INCL CAD   5. Stage 4 chronic kidney disease (HCC) N18.4 585.4          Plan:     - Continue sliding scale insulin while on Prednisone   - Instructed to keep glucose log and bring to next vist   - Follow up for annual eye exam   - Referral to Dr Westley Chandler, Nephrology for CKD has appointment next week   - Follow up with Physical Therapy, order placed today   - Referral for screening mammogram   - Follow up for medicare physical in 2 weeks      Patient is counseled to return to the office if symptoms do not improve as expected.  Urgent consultation with the nearest Emergency Department is strongly recommended if condition worsens.  Patient is counseled to follow up as recommended and to inform the office if any changes in treatment are recommended.            Signed By:  Leanora Ivanoff, MD    Family Medicine Resident

## 2017-06-08 NOTE — Telephone Encounter (Signed)
Hi Becky,  Can you check if she has [employer], if so can you give me the [facility].  I'm working on a PA for her [employer].  Elsworth Ledin

## 2017-06-10 ENCOUNTER — Ambulatory Visit: Payer: MEDICARE | Primary: Family Medicine

## 2017-06-10 ENCOUNTER — Inpatient Hospital Stay: Payer: MEDICARE | Attending: Family Medicine | Primary: Family Medicine

## 2017-06-11 NOTE — Progress Notes (Signed)
[facility] placed on Dr. Ewell PoeAnderson's desk.

## 2017-06-24 ENCOUNTER — Ambulatory Visit: Admit: 2017-06-24 | Discharge: 2017-06-24 | Payer: MEDICARE | Attending: Family Medicine | Primary: Family Medicine

## 2017-06-24 DIAGNOSIS — Z0000 Encounter for general adult medical examination without abnormal findings: Secondary | ICD-10-CM

## 2017-06-24 MED ORDER — VARICELLA-ZOSTER GLYCOE VACC-AS01B ADJ(PF) 50 MCG/0.5 ML IM SUSPENSION
50 mcg/0.5 mL | INTRAMUSCULAR | 1 refills | Status: DC
Start: 2017-06-24 — End: 2017-10-04

## 2017-06-24 MED ORDER — NOVOLOG FLEXPEN U-100 INSULIN ASPART 100 UNIT/ML (3 ML) SUBCUTANEOUS
100 unit/mL (3 mL) | SUBCUTANEOUS | 1 refills | Status: AC
Start: 2017-06-24 — End: ?

## 2017-06-24 MED ORDER — PNEUMOCOCCAL 13-VAL CONJ VACCINE-DIP CRM (PF) 0.5 ML IM SYRINGE
0.5 mL | Freq: Once | INTRAMUSCULAR | 0 refills | Status: AC
Start: 2017-06-24 — End: 2017-06-24

## 2017-06-24 NOTE — Progress Notes (Signed)
Identified Patient with two Patient identifiers (Name and DOB). Two Patient Identifiers confirmed. Reviewed record in preparation for visit and have obtained necessary documentation.    Chief Complaint   Patient presents with   ??? Annual Wellness Visit     and follow up from 06/02/17       Visit Vitals  BP 129/74 (BP 1 Location: Left arm, BP Patient Position: Sitting)   Pulse 86   Temp 97.9 ??F (36.6 ??C) (Oral)   Resp 16   Ht 4\' 11"  (1.499 m)   Wt 107 lb (48.5 kg)   SpO2 99%   BMI 21.61 kg/m??       1. Have you been to the ER, urgent care clinic since your last visit?  Hospitalized since your last visit?No    2. Have you seen or consulted any other health care providers outside of the [facility] since your last visit?  Include any pap smears or colon screening. No

## 2017-06-24 NOTE — Telephone Encounter (Signed)
Spoke with Dr Raleigh CallasSharma and he stated max daily dose on sliding scale insulin is 30 units. Let pharmacist know this daily max.

## 2017-06-24 NOTE — Patient Instructions (Addendum)
Medicare Wellness Visit, Female     The best way to live healthy is to have a lifestyle where you eat a well-balanced diet, exercise regularly, limit alcohol use, and quit all forms of tobacco/nicotine, if applicable.   Regular preventive services are another way to keep healthy. Preventive services (vaccines, screening tests, monitoring & exams) can help personalize your care plan, which helps you manage your own care. Screening tests can find health problems at the earliest stages, when they are easiest to treat.   [name] follows the current, evidence-based guidelines published by the [place] [employer] (USPSTF) when recommending preventive services for our patients. Because we follow these guidelines, sometimes recommendations change over time as research supports it. (For example, mammograms used to be recommended annually. Even though Medicare will still pay for an annual mammogram, the newer guidelines recommend a mammogram every two years for women of average risk.)  Of course, you and your doctor may decide to screen more often for some diseases, based on your risk and your health status.   Preventive services for you include:  - Medicare offers their members a free annual wellness visit, which is time for you and your primary care provider to discuss and plan for your preventive service needs. Take advantage of this benefit every year!  -All adults over the age of 58 should receive the recommended pneumonia vaccines. Current USPSTF guidelines recommend a series of two vaccines for the best pneumonia protection.   -All adults should have a flu vaccine yearly and a tetanus vaccine every 10 years. All adults age 15 and older should receive a shingles vaccine once in their lifetime.    -A bone mass density test is recommended when a woman turns 65 to screen for osteoporosis. This test is only recommended one time, as a screening.  Some providers will use this same test as a disease monitoring tool if you already have osteoporosis.  -All adults age 44-70 who are overweight should have a diabetes screening test once every three years.   -Other screening tests and preventive services for persons with diabetes include: an eye exam to screen for diabetic retinopathy, a kidney function test, a foot exam, and stricter control over your cholesterol.   -Cardiovascular screening for adults with routine risk involves an electrocardiogram (ECG) at intervals determined by your doctor.   -Colorectal cancer screenings should be done for adults age 26-75 with no increased risk factors for colorectal cancer.  There are a number of acceptable methods of screening for this type of cancer. Each test has its own benefits and drawbacks. Discuss with your doctor what is most appropriate for you during your annual wellness visit. The different tests include: colonoscopy (considered the best screening method), a fecal occult blood test, a fecal DNA test, and sigmoidoscopy.  -Breast cancer screenings are recommended every other year for women of normal risk, age 52-74.  -Cervical cancer screenings for women over age 58 are only recommended with certain risk factors.   -All adults born between [birth year] and 1964 should be screened once for Hepatitis C.     Here is a list of your current Health Maintenance items (your personalized list of preventive services) with a due date:  Health Maintenance Due   Topic Date Due   ??? Hepatitis C Test  August 18, 1946   ??? DTaP/Tdap/Td  (1 - Tdap) 09/05/1967   ??? Shingles Vaccine (1 of 2) 09/04/1996   ??? Breast Cancer Screening  09/04/1996   ???  Stool testing for trace blood  09/04/1996   ??? Glaucoma Screening   09/05/2011   ??? Bone Mineral Density   09/05/2011   ??? Pneumococcal Vaccine (1 of 2 - PCV13) 09/05/2011   ??? Annual Well Visit  05/18/2017       Ashley Ablesamy V. Eid, MD  [facility]  [address]  [place], [place] [id number]   Office: [phone]    Ian MalkinAlfred Lee, MD  [facility]  [address]  [place], [place] [id number]  Phone: [phone]  Fax: [phone]

## 2017-06-24 NOTE — Progress Notes (Signed)
71 year old female here for Medicare Wellness Visit    I reviewed with the resident the medical history and the resident's findings on the physical examination.  I discussed with the resident the patient's diagnosis and concur with the plan.

## 2017-06-24 NOTE — Progress Notes (Signed)
Date of visit: 06/24/2017       I have reviewed the patient's medical history in detail and updated the computerized patient record.     Ashley Drake is a 71 y.o. female   History obtained from: the patient and daughter    Concerns today   (Patient understands that medical problems addressed today may incur additional cost as this is a preventive visit)  Would like refill of insulin pen.     History     Patient Active Problem List   Diagnosis Code   ??? Type 2 diabetes mellitus (HCC) E11.9   ??? Hypertension I10   ??? 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 GERD (gastroesophageal reflux disease) K21.9   ??? History of CVA (cerebrovascular accident) 24Z86.73   ??? 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 Diabetes (HCC)    ??? Hypertension    ??? T.T.P. syndrome (HCC)       No past surgical history on file.  No Known Allergies  Current Outpatient Medications   Medication Sig Dispense Refill   ??? pneumococcal 13 val conj dip (PREVNAR 13, PF,) 0.5 mL syrg injection 0.5 mL by IntraMUSCular route once for 1 dose. 0.5 mL 0   ??? varicella-zoster recombinant, PF, (SHINGRIX, PF,) 50 mcg/0.5 mL susr injection 0.285mL by IntraMUSCular route once now and then repeat in 2-6 months 0.5 mL 1   ??? NOVOLOG FLEXPEN U-100 INSULIN 100 unit/mL inpn Please use as instructed 3 Adjustable Dose Pre-filled Pen Syringe 1   ??? fluconazole (DIFLUCAN) 50 mg tablet      ??? predniSONE (DELTASONE) 5 mg tablet      ??? predniSONE (DELTASONE) 10 mg tablet      ??? glucose blood VI test strips (RELION PRIME TEST STRIPS) strip Please check blood sugars 4 times a day 200 Strip 2   ??? magnesium oxide (MAG-OX) 400 mg (241.3 mg magnesium) tablet Take 1 Tab by mouth two (2) times a day. 180 Tab 0   ??? vit B Cmplx 3-FA-Vit C-Biotin (NEPHRO VITE RX) 1-60-300 mg-mg-mcg tablet Take 1 Tab by mouth daily. 90 Tab 0   ??? amLODIPine (NORVASC) 10 mg tablet Take 1 Tab by mouth daily. 90 Tab 0    ??? pantoprazole (PROTONIX) 40 mg tablet Take 1 Tab by mouth daily. 90 Tab 0   ??? carvedilol (COREG) 25 mg tablet Take 1 Tab by mouth two (2) times daily (with meals). 90 Tab 0   ??? isosorbide mononitrate ER (IMDUR) 60 mg CR tablet Take 1 Tab by mouth every morning. 90 Tab 0   ??? bumetanide (BUMEX) 1 mg tablet      ??? bumetanide (BUMEX) 0.5 mg tablet        No family history on file.  Social History     Tobacco Use   ??? Smoking status: Never Smoker   ??? Smokeless tobacco: Never Used   Substance Use Topics   ??? Alcohol use: No       Specialists/Care Team   Alizeh D. Heying has established care with the following healthcare providers:  No care team member to display  Dr Noland FordyceLentz, Cardiology   Dr Westley ChandlerAcker, Nephrology   Dr Carmell AustriaSchunn,  Hematology   Dr Raleigh CallasSharma, PCP     Health Risk Assessment     Demographics   female  71 y.o.    General Health Questions   -During the past 4  weeks:   -how would you rate your health in general? Fair   -how often have you been bothered by feeling dizzy when standing up? occasionally   -how much have you been bothered by bodily pain? not   -Have you noticed any hearing difficulties? no   -has your physical and emotional health limited your social activities with family or friends? no    Emotional Health Questions   -Do you have a history of depression, anxiety, or emotional problems? no  -Over the past 2 weeks, have you felt down, depressed or hopeless? no  -Over the past 2 weeks, have you felt little interest or pleasure in doing things? no    Health Habits   Please describe your diet habits: Eat three meals a day plus snacks. High protein low carb diet.   Do you get 5 servings of fruits or vegetables daily? no  Do you exercise regularly? No  Works with PT     Activities of Daily Living and Functional Status   -Do you need help with eating, walking, dressing, bathing, toileting, the phone, transportation, shopping, preparing meals, housework, laundry, medications or managing money? yes   -In the past four weeks, was someone available to help you if you needed and wanted help with anything? yes  -Are you confident are you that you can control and manage most of your health problems? yes  -Have you been given information to help you keep track of your medications? yes  -How often do you have trouble taking your medications as prescribed? never    Fall Risk and Home Safety   Have you fallen 2 or more times in the past year? yes  Does your home have rugs in the hallway, lack grab bars in the bathroom, lack handrails on the stairs or have poor lighting? no  Do you have smoke detectors and check them regularly? yes  Do you have difficulties driving a car? no and NA  Do you always fasten your seat belt when you are in a car? yes    Review of Systems (if indicated for problems addressed today)   NA    Physical Examination     Vitals:    06/24/17 0822   BP: 129/74   Pulse: 86   Resp: 16   Temp: 97.9 ??F (36.6 ??C)   TempSrc: Oral   SpO2: 99%   Weight: 107 lb (48.5 kg)   Height: 4\' 11"  (1.499 m)     Body mass index is 21.61 kg/m??.   No exam data present      Evaluation of Cognitive Function   Mood/affect:  neutral  Orientation: Person, Place, Time and Situation  Appearance: age appropriate  Family member/caregiver input:Daughter reports that she is doing better.     Additional exam if indicated for problems addressed today:    Advice/Referrals/Counseling (as indicated)   Education and counseling provided for any problems identified above:     Preventive Services     Health Maintenance   Topic Date Due   ??? Hepatitis C Screening  03-Nov-1945   ??? DTaP/Tdap/Td series (1 - Tdap) 09/05/1967   ??? Shingrix Vaccine Age 55> (1 of 2) 09/04/1996   ??? BREAST CANCER SCRN MAMMOGRAM  09/04/1996   ??? FOBT Q 1 YEAR AGE 59-75  09/04/1996   ??? GLAUCOMA SCREENING Q2Y  09/05/2011   ??? Bone Densitometry (Dexa) Screening  09/05/2011   ??? Pneumococcal 65+ Low/Medium Risk (1 of 2 - PCV13) 09/05/2011   ???  MEDICARE YEARLY EXAM  05/18/2017    ??? Influenza Age 26 to Adult  Completed       (Preventive care checklist to be included in patient instructions)  Discussed today Done Previously Not Needed    X   Pneumococcal vaccines    X  Flu vaccine      Hepatitis B vaccine (if at risk)   X   Shingles vaccine   X   TDAP vaccine   x   Digital rectal exam     X PSA   X ~ 5 years ago in [facility], does not have reports    Colorectal cancer screening     X Low-dose CT for lung cancer screening   X   Bone density test    X - checked last week   Glaucoma screening    x  Cholesterol test    x  Diabetes screening test       Diabetes self-management class      Nutritionist referral for diabetes or renal disease     Discussion of Advance Directive   Discussed with Saniya D. Renwick her ability to prepare and advance directive in the case that an injury or illness causes her to be unable to make health care decisions.   Full code    Assessment/Plan       ICD-10-CM ICD-9-CM    1. Medicare annual wellness visit, subsequent Z00.00 V70.0 DEXA BONE DENSITY STUDY AXIAL      REFERRAL TO GASTROENTEROLOGY   2. Screening for depression Z13.31 V79.0 DEPRESSION SCREEN ANNUAL   3. Screen for colon cancer Z12.11 V76.51    4. Encounter for screening mammogram for malignant neoplasm of breast Z12.31 V76.12 MAM MAMMO BI SCREENING INCL CAD   5. Encounter for immunization Z23 V03.89 pneumococcal 13 val conj dip (PREVNAR 13, PF,) 0.5 mL syrg injection      TETANUS, DIPHTHERIA TOXOIDS AND ACELLULAR PERTUSSIS VACCINE (TDAP), IN INDIVIDS. >=7, IM   6. 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 Depression Screen Annual   ??? Bilateral Digital Screening Mammography    ??? Dexa Bone Density Study Axial ([id number])   ??? Tetanus, Diphtheria Toxoids and Acellular Pertussis Vaccine (TDAP)   ??? [facility]   ??? pneumococcal 13 val conj dip (PREVNAR 13, PF,) 0.5 mL syrg injection   ??? varicella-zoster recombinant, PF, (SHINGRIX, PF,) 50 mcg/0.5 mL susr injection   ??? NOVOLOG FLEXPEN U-100 INSULIN 100 unit/mL inpn       Follow-up Disposition: Not on File    Leanora Ivanoff, MD

## 2017-06-24 NOTE — Telephone Encounter (Signed)
[facility]    They need to know max daily unit per day for    inpn 3 Adjustable Dose Pre-filled Pen Syringe 1 06/24/2017     Sig: Please use as instructed    Sent to pharmacy as: NOVOLOG FLEXPEN U-100 INSULIN 100 unit/mL inpn    E-Prescribing Status: Receipt confirmed by pharmacy (06/24/2017 ??9:14 AM EDT)      Nurse Fleet Contrasachel D took call

## 2017-07-06 ENCOUNTER — Encounter

## 2017-07-07 ENCOUNTER — Inpatient Hospital Stay: Admit: 2017-07-07 | Payer: MEDICARE | Attending: Family Medicine | Primary: Family Medicine

## 2017-07-07 DIAGNOSIS — Z1231 Encounter for screening mammogram for malignant neoplasm of breast: Secondary | ICD-10-CM

## 2017-07-07 NOTE — Progress Notes (Signed)
Reviewed.

## 2017-07-09 ENCOUNTER — Encounter

## 2017-07-10 MED ORDER — CARVEDILOL 25 MG TAB
25 mg | ORAL_TABLET | ORAL | 1 refills | Status: DC
Start: 2017-07-10 — End: 2017-10-04

## 2017-07-27 ENCOUNTER — Encounter

## 2017-07-28 NOTE — Telephone Encounter (Signed)
Natalia LeatherwoodKatherine nurse with Dr. Radford PaxGisa Schunn/Oncology office calling, to speak with nurse about abnormal blood test and Rx management.  She states she will fax over labs in which she's referencing,    Call [phone]

## 2017-07-30 MED ORDER — AMLODIPINE 10 MG TAB
10 mg | ORAL_TABLET | ORAL | 0 refills | Status: DC
Start: 2017-07-30 — End: 2017-10-04

## 2017-10-04 ENCOUNTER — Encounter: Attending: Family Medicine | Primary: Family Medicine

## 2017-10-04 ENCOUNTER — Ambulatory Visit: Admit: 2017-10-04 | Discharge: 2017-10-04 | Payer: MEDICARE | Attending: Family Medicine | Primary: Family Medicine

## 2017-10-04 DIAGNOSIS — R232 Flushing: Secondary | ICD-10-CM

## 2017-10-04 MED ORDER — VENLAFAXINE SR 37.5 MG 24 HR CAP
37.5 mg | ORAL_CAPSULE | Freq: Every day | ORAL | 1 refills | Status: AC
Start: 2017-10-04 — End: ?

## 2017-10-04 MED ORDER — AMLODIPINE 10 MG TAB
10 mg | ORAL_TABLET | ORAL | 1 refills | Status: AC
Start: 2017-10-04 — End: ?

## 2017-10-04 MED ORDER — FLUCONAZOLE 50 MG TAB
50 mg | ORAL_TABLET | Freq: Every day | ORAL | 0 refills | Status: AC
Start: 2017-10-04 — End: ?

## 2017-10-04 MED ORDER — CARVEDILOL 25 MG TAB
25 mg | ORAL_TABLET | ORAL | 1 refills | Status: AC
Start: 2017-10-04 — End: ?

## 2017-10-04 NOTE — Progress Notes (Signed)
Lipid panel ok, LDL 71 almost at goal given DMII. Not currently on statin.   10 year ASCVD Risk 27.7%  CBC ok   Cr 2.56 (previously 2.43) - has stage 4 CKD, sees Dr. Westley ChandlerAcker     Has f/u visit on 2/25 with Dr. Raleigh CallasSharma (PCP)   - Consider statin medication vs lifestyle modifications (due to elevated ASCVD Risk even though lipid panel itself appears excellent)   - Will confirm that patient is still seeing nephrology for CKD4    Ashley ShieldsBeverly Marquette Piontek, Ashley Drake  10/07/17  3:02 PM

## 2017-10-04 NOTE — Patient Instructions (Addendum)
Call GI to schedule an appointment for the colonoscopy  Follow up with heme/onc and endocrinologist   High Blood Pressure: Care Instructions  Overview    It's normal for blood pressure to go up and down throughout the day. But if it stays up, you have high blood pressure. Another name for high blood pressure is hypertension.  Despite what a lot of people think, high blood pressure usually doesn't cause headaches or make you feel dizzy or lightheaded. It usually has no symptoms. But it does increase your risk of stroke, heart attack, and other problems. You and your doctor will talk about your risks of these problems based on your blood pressure.  Your doctor will give you a goal for your blood pressure. Your goal will be based on your health and your age.  Lifestyle changes, such as eating healthy and being active, are always important to help lower blood pressure. You might also take medicine to reach your blood pressure goal.  Follow-up care is a key part of your treatment and safety. Be sure to make and go to all appointments, and call your doctor if you are having problems. It's also a good idea to know your test results and keep a list of the medicines you take.  How can you care for yourself at home?  Medical treatment  ?? If you stop taking your medicine, your blood pressure will go back up. You may take one or more types of medicine to lower your blood pressure. Be safe with medicines. Take your medicine exactly as prescribed. Call your doctor if you think you are having a problem with your medicine.  ?? Talk to your doctor before you start taking aspirin every day. Aspirin can help certain people lower their risk of a heart attack or stroke. But taking aspirin isn't right for everyone, because it can cause serious bleeding.  ?? See your doctor regularly. You may need to see the doctor more often at first or until your blood pressure comes down.   ?? If you are taking blood pressure medicine, talk to your doctor before you take decongestants or anti-inflammatory medicine, such as ibuprofen. Some of these medicines can raise blood pressure.  ?? Learn how to check your blood pressure at home.  Lifestyle changes  ?? Stay at a healthy weight. This is especially important if you put on weight around the waist. Losing even 10 pounds can help you lower your blood pressure.  ?? If your doctor recommends it, get more exercise. Walking is a good choice. Bit by bit, increase the amount you walk every day. Try for at least 30 minutes on most days of the week. You also may want to swim, bike, or do other activities.  ?? Avoid or limit alcohol. Talk to your doctor about whether you can drink any alcohol.  ?? Try to limit how much sodium you eat to less than 2,300 milligrams (mg) a day. Your doctor may ask you to try to eat less than 1,500 mg a day.  ?? Eat plenty of fruits (such as bananas and oranges), vegetables, legumes, whole grains, and low-fat dairy products.  ?? Lower the amount of saturated fat in your diet. Saturated fat is found in animal products such as milk, cheese, and meat. Limiting these foods may help you lose weight and also lower your risk for heart disease.  ?? Do not smoke. Smoking increases your risk for heart attack and stroke. If you need help quitting, talk to  your doctor about stop-smoking programs and medicines. These can increase your chances of quitting for good.  When should you call for help?  Call 911 anytime you think you may need emergency care. This may mean having symptoms that suggest that your blood pressure is causing a serious heart or blood vessel problem. Your blood pressure may be over 180/120.  ??For example, call 911 if:  ?? ?? You have symptoms of a heart attack. These may include:  ? Chest pain or pressure, or a strange feeling in the chest.  ? Sweating.  ? Shortness of breath.  ? Nausea or vomiting.   ? Pain, pressure, or a strange feeling in the back, neck, jaw, or upper belly or in one or both shoulders or arms.  ? Lightheadedness or sudden weakness.  ? A fast or irregular heartbeat.   ?? ?? You have symptoms of a stroke. These may include:  ? Sudden numbness, tingling, weakness, or loss of movement in your face, arm, or leg, especially on only one side of your body.  ? Sudden vision changes.  ? Sudden trouble speaking.  ? Sudden confusion or trouble understanding simple statements.  ? Sudden problems with walking or balance.  ? A sudden, severe headache that is different from past headaches.   ?? ?? You have severe back or belly pain.   ??Do not wait until your blood pressure comes down on its own. Get help right away.  ??Call your doctor now or seek immediate care if:  ?? ?? Your blood pressure is much higher than normal (such as 180/120 or higher), but you don't have symptoms.   ?? ?? You think high blood pressure is causing symptoms, such as:  ? Severe headache.  ? Blurry vision.   ??Watch closely for changes in your health, and be sure to contact your doctor if:  ?? ?? Your blood pressure measures higher than your doctor recommends at least 2 times. That means the top number is higher or the bottom number is higher, or both.   ?? ?? You think you may be having side effects from your blood pressure medicine.   Where can you learn more?  Go to [employer].  Enter [phone] in the search box to learn more about "High Blood Pressure: Care Instructions."  Current as of: March 14, 2017  Content Version: 11.9  ?? 2006-2018 [employer]. Care instructions adapted under license by [employer] (which disclaims liability or warranty for this information). If you have questions about a medical condition or this instruction, always ask your healthcare professional. [employer] disclaims any warranty or liability for your use of this information.

## 2017-10-04 NOTE — Progress Notes (Signed)
Chief Complaint   Patient presents with   ??? Medication Refill

## 2017-10-04 NOTE — Progress Notes (Deleted)
Subjective:      Ashley Drake is a 72 y.o. female who presents for ***    ROS:  General/Constitutional:   No headache, fever, fatigue, weight loss or weight gain       Eyes:   No redness, pruritis, pain, visual changes, swelling, or discharge      Ears:    No pain, loss or changes in hearing     Neck:   No swelling, masses, stiffness, pain, or limited movement     Cardiac:    No chest pain      Respiratory:   No cough or shortness of breath     GI:   No nausea/vomiting, diarrhea, abdominal pain, bloody or dark stools       GU:   No dysuria or  hematuria    Neurological:   No loss of consciousness, dizziness, seizures, dysarthria, cognitive changes, memory changes,  problems with balance, or unilateral weakness     Skin: No rash     PMHx:  Past Medical History:   Diagnosis Date   ??? Diabetes (HCC)    ??? Hypertension    ??? T.T.P. syndrome (HCC)        Meds:   Current Outpatient Medications   Medication Sig Dispense Refill   ??? amLODIPine (NORVASC) 10 mg tablet TAKE 1 TABLET BY MOUTH ONCE DAILY 90 Tab 0   ??? carvedilol (COREG) 25 mg tablet TAKE 1 TABLET BY MOUTH TWICE DAILY WITH FOOD 180 Tab 1   ??? varicella-zoster recombinant, PF, (SHINGRIX, PF,) 50 mcg/0.5 mL susr injection 0.51mL by IntraMUSCular route once now and then repeat in 2-6 months 0.5 mL 1   ??? NOVOLOG FLEXPEN U-100 INSULIN 100 unit/mL inpn Please use as instructed 3 Adjustable Dose Pre-filled Pen Syringe 1   ??? bumetanide (BUMEX) 1 mg tablet      ??? bumetanide (BUMEX) 0.5 mg tablet      ??? fluconazole (DIFLUCAN) 50 mg tablet      ??? predniSONE (DELTASONE) 5 mg tablet      ??? predniSONE (DELTASONE) 10 mg tablet      ??? glucose blood VI test strips (RELION PRIME TEST STRIPS) strip Please check blood sugars 4 times a day 200 Strip 2   ??? magnesium oxide (MAG-OX) 400 mg (241.3 mg magnesium) tablet Take 1 Tab by mouth two (2) times a day. 180 Tab 0   ??? vit B Cmplx 3-FA-Vit C-Biotin (NEPHRO VITE RX) 1-60-300 mg-mg-mcg tablet Take 1 Tab by mouth daily. 90 Tab 0    ??? pantoprazole (PROTONIX) 40 mg tablet Take 1 Tab by mouth daily. 90 Tab 0   ??? isosorbide mononitrate ER (IMDUR) 60 mg CR tablet Take 1 Tab by mouth every morning. 90 Tab 0       Allergies:   No Known Allergies    Smoker:  Social History     Tobacco Use   Smoking Status Never Smoker   Smokeless Tobacco Never Used       ETOH:   Social History     Substance and Sexual Activity   Alcohol Use No       FH: No family history on file.      Objective:     There were no vitals taken for this visit.    Physical Examination:   GEN: No apparent distress. Alert and oriented and responds to all questions appropriately.  EYES:  Conjunctiva clear; pupils round and reactive to light; extraocular movements are intact.  EAR: External ears are normal.  Tympanic membranes are clear and without effusion.  NOSE: Turbinates are within normal limits.  No drainage  OROPHYARYNX: No oral lesions or exudates.  NECK:  Supple; no masses; thyroid normal           LUNGS: Respirations unlabored; clear to auscultation bilaterally  CARDIOVASCULAR: Regular, rate, and rhythm without murmurs, gallops or rubs   ABDOMEN: Soft; nontender; nondistended; normoactive bowel sounds; no masses or organomegaly  NEUROLOGIC:  No focal neurologic deficits. Strength and sensation grossly intact. Coordination and gait grossly intact.   EXT: Well perfused. No edema.  SKIN: No obvious rashes.      Assessment:   {No Diagnosis Found}    ***    Plan:     ***    Patient is counseled to return to the office if symptoms do not improve as expected.  Urgent consultation with the nearest Emergency Department is strongly recommended if condition worsens.  Patient is counseled to follow up as recommended and to inform the office if any changes in treatment are recommended.      Patient *** with Dr. Marland Kitchen***       Signed By:  Leanora IvanoffSandeep Marquis Diles, MD    Family Medicine Resident

## 2017-10-04 NOTE — Progress Notes (Signed)
HPI       Chief Complaint: DMII, HTN follow up     Ashley Drake is a 72 y.o. female who presents for a "checkup." Pt with dementia, has an Aid with her but it is their first day together. Daughter was called over the phone and additional history obtained.     DMII - tradjenta 5mg  daily and sliding scale insulin. Daughter checked sugar this AM, was 95. Insulin is given twice a day. Caretaker forgot glucose check book at home. Reports her DMII is now managed by Endocrinolgoist, Dr. Verdie MosherLiu (seen 07/2017). Next appt in March.     HTN - coreg 25 BID, amlodipine 10mg  daily, which they need refills for. Caretakers do not check home BPs. No dose changes since 03/2017.     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 Daughter states at last visit Dr. Carmell AustriaSchunn wanted PCP to change her blood thinner from ELiquis to coumadin and was going to send something over to Dr. Raleigh CallasSharma (PCP). Next appt with Dr. Carmell AustriaSchunn is in a few weeks. She also prescribes fluconazole as ppx for thrush.     Hot flashes - taking ffexor 37.5, which she needs refills on     GERD - takes protonix PRN     After last visit [facility] they were unable to have the colonoscopy done. Daughter states they went and were told they needed additional information from PCP. Unclear what exactly was required as referral is in the system. They have not been contacted or called GI to reschedule.      PMHx:  Past Medical History:   Diagnosis Date   ??? Diabetes (HCC)    ??? Hypertension    ??? T.T.P. syndrome (HCC)      Meds:   Current Outpatient Medications   Medication Sig Dispense Refill   ??? venlafaxine-SR (EFFEXOR-XR) 37.5 mg capsule Take 1 Cap by mouth daily. 90 Cap 1   ??? fluconazole (DIFLUCAN) 50 mg tablet Take 1 Tab by mouth daily. 30 Tab 0   ??? amLODIPine (NORVASC) 10 mg tablet TAKE 1 TABLET BY MOUTH ONCE DAILY 90 Tab 1   ??? carvedilol (COREG) 25 mg tablet TAKE 1 TABLET BY MOUTH TWICE DAILY WITH FOOD 180 Tab 1   ??? ELIQUIS 2.5 mg tablet       ??? TRADJENTA 5 mg tablet      ??? NANO PEN NEEDLE 32 gauge x 5/32" ndle      ??? NOVOLOG MIX 70-30FLEXPEN U-100 100 unit/mL (70-30) inpn      ??? isosorbide mononitrate ER (IMDUR) 60 mg CR tablet      ??? NOVOLOG FLEXPEN U-100 INSULIN 100 unit/mL inpn Please use as instructed 3 Adjustable Dose Pre-filled Pen Syringe 1   ??? predniSONE (DELTASONE) 5 mg tablet      ??? predniSONE (DELTASONE) 10 mg tablet      ??? glucose blood VI test strips (RELION PRIME TEST STRIPS) strip Please check blood sugars 4 times a day 200 Strip 2   ??? vit B Cmplx 3-FA-Vit C-Biotin (NEPHRO VITE RX) 1-60-300 mg-mg-mcg tablet Take 1 Tab by mouth daily. 90 Tab 0   ??? pantoprazole (PROTONIX) 40 mg tablet Take 1 Tab by mouth daily. 90 Tab 0     Allergies:   No Known Allergies    Smoker:  Social History     Tobacco Use   Smoking Status Never Smoker   Smokeless Tobacco Never Used     ETOH:  Social History     Substance and Sexual Activity   Alcohol Use No     FH:   No family history on file.    ROS  Review of Systems   Constitutional: Positive for weight loss. Negative for chills and fever.        Report about an 8 lb weight loss over the past few months   HENT: Negative for congestion, sinus pain and sore throat.    Respiratory: Negative for cough, shortness of breath and wheezing.    Cardiovascular: Negative for chest pain and palpitations.   Gastrointestinal: Negative for abdominal pain, constipation, diarrhea, nausea and vomiting.   Musculoskeletal: Negative for falls.   Skin: Negative for itching and rash.   Neurological: Negative for dizziness, weakness and headaches.       Physical Exam:  Visit Vitals  BP 145/79   Pulse 80   Temp 98.1 ??F (36.7 ??C)   Ht 4\' 11"  (1.499 m)   Wt 105 lb (47.6 kg)   SpO2 100%   BMI 21.21 kg/m??       Wt Readings from Last 3 Encounters:   10/04/17 105 lb (47.6 kg)   06/24/17 107 lb (48.5 kg)   06/02/17 108 lb (49 kg)     BP Readings from Last 3 Encounters:   10/04/17 145/79   06/24/17 129/74   06/02/17 133/77      Physical Exam    Constitutional: She appears well-developed and well-nourished.   HENT:   Head: Normocephalic and atraumatic.   Eyes: EOM are normal.   Neck: Normal range of motion. Neck supple. No thyromegaly present.   Cardiovascular: Normal rate and regular rhythm.   No murmur heard.  Pulmonary/Chest: Effort normal and breath sounds normal. No respiratory distress. She has no wheezes. She has no rales.   Abdominal: Soft. Bowel sounds are normal. She exhibits no distension. There is no tenderness.   Skin: Skin is warm and dry.   Psychiatric: She has a normal mood and affect.   Vitals reviewed.    I personally reviewed the following lab results:   No results found for this or any previous visit (from the past 12 hour(s)).          Assessment     72 y.o. female with:    ICD-10-CM ICD-9-CM    1. Hot flashes R23.2 782.62 venlafaxine-SR (EFFEXOR-XR) 37.5 mg capsule   2. Essential hypertension I10 401.9 amLODIPine (NORVASC) 10 mg tablet      carvedilol (COREG) 25 mg tablet      LIPID PANEL      CBC W/O DIFF      METABOLIC PANEL, COMPREHENSIVE   3. History of thrush Z86.19 V12.09 fluconazole (DIFLUCAN) 50 mg tablet   4. Type 2 diabetes mellitus without complication, with long-term current use of insulin (HCC) E11.9 250.00     Z79.4 V58.67               Plan       Orders Placed This Encounter   ??? LIPID PANEL   ??? CBC W/O DIFF   ??? METABOLIC PANEL, COMPREHENSIVE   ??? CVD REPORT   ??? CKD REPORT   ??? venlafaxine-SR (EFFEXOR-XR) 37.5 mg capsule   ??? fluconazole (DIFLUCAN) 50 mg tablet   ??? amLODIPine (NORVASC) 10 mg tablet   ??? carvedilol (COREG) 25 mg tablet     DMII - followed by Endocrinology (Dr. Verdie Mosher)    HTN - Continue coreg 25 BID, amlodipine 10mg   daily. Refills provided.     TTP - Followed by Dr. Carmell Austria   - Fluconazole refilled x 1 month until they are able to see Dr. Carmell Austria in a few weeks    Hot flashes - Continue effexor 37.5. Refills provided.     GERD - Continue protonix PRN.     Health Maintenance   - Colonoscopy referral is in, provided office information again and asked them to call to schedule appt.   - Mammogram negative  - DEXA showing osteopenia    Follow up with PCP (Dr. Raleigh Callas) on 10/18/17  Patient discussed with Dr. Valarie Merino     I have discussed the diagnosis with the patient and the intended plan as seen in the above orders. The patient has received an after-visit summary and questions were answered concerning future plans.  I have discussed medication side effects and warnings with the patient as well.    Lynnda Shields, MD  Family Medicine Resident  PGY-2

## 2017-10-04 NOTE — Progress Notes (Signed)
I saw and evaluated the patient, performing the key elements of the service.  I discussed the findings, assessment and plan with the resident and agree with the resident's findings and plan as documented in the resident's note.

## 2017-10-05 LAB — CBC W/O DIFF
HCT: 36.8 % (ref 34.0–46.6)
HGB: 12.1 g/dL (ref 11.1–15.9)
MCH: 27.8 pg (ref 26.6–33.0)
MCHC: 32.9 g/dL (ref 31.5–35.7)
MCV: 84 fL (ref 79–97)
PLATELET: 170 10*3/uL (ref 150–379)
RBC: 4.36 x10E6/uL (ref 3.77–5.28)
RDW: 15 % (ref 12.3–15.4)
WBC: 7.4 10*3/uL (ref 3.4–10.8)

## 2017-10-05 LAB — METABOLIC PANEL, COMPREHENSIVE
A-G Ratio: 2.1 (ref 1.2–2.2)
ALT (SGPT): 33 IU/L — ABNORMAL HIGH (ref 0–32)
AST (SGOT): 28 IU/L (ref 0–40)
Albumin: 4.6 g/dL (ref 3.5–4.8)
Alk. phosphatase: 59 IU/L (ref 39–117)
BUN/Creatinine ratio: 18 (ref 12–28)
BUN: 47 mg/dL — ABNORMAL HIGH (ref 8–27)
Bilirubin, total: <0.2 mg/dL (ref 0.0–1.2)
CO2: 20 mmol/L (ref 20–29)
Calcium: 9.7 mg/dL (ref 8.7–10.3)
Chloride: 106 mmol/L (ref 96–106)
Creatinine: 2.56 mg/dL — ABNORMAL HIGH (ref 0.57–1.00)
GFR est AA: 21 mL/min/{1.73_m2} — ABNORMAL LOW (ref >59)
GFR est non-AA: 18 mL/min/{1.73_m2} — ABNORMAL LOW (ref >59)
GLOBULIN, TOTAL: 2.2 g/dL (ref 1.5–4.5)
Glucose: 110 mg/dL — ABNORMAL HIGH (ref 65–99)
Potassium: 4.8 mmol/L (ref 3.5–5.2)
Protein, total: 6.8 g/dL (ref 6.0–8.5)
Sodium: 143 mmol/L (ref 134–144)

## 2017-10-05 LAB — CVD REPORT

## 2017-10-05 LAB — LIPID PANEL
Cholesterol, total: 154 mg/dL (ref 100–199)
HDL Cholesterol: 64 mg/dL (ref >39)
LDL, calculated: 71 mg/dL (ref 0–99)
Triglyceride: 93 mg/dL (ref 0–149)
VLDL, calculated: 19 mg/dL (ref 5–40)

## 2017-10-05 LAB — CKD REPORT

## 2017-10-18 ENCOUNTER — Encounter: Attending: Family Medicine | Primary: Family Medicine

## 2017-10-18 NOTE — Telephone Encounter (Signed)
Dr.Sharma,     Would you be able to call and speak with the daughter. Dr.Huang had seen them and suggested that they follow up with you in regards to the new medication. They never got the voicemail that you were taken out of clinic this afternoon.  Please let me know what your thoughts are on this.     Thanks, Pattricia BossAnnie

## 2017-10-18 NOTE — Telephone Encounter (Signed)
Patient had an appointment this afternoon with Dr. Raleigh CallasSharma that was cancelled by the provider due to not being in the office.  Patient's daughter, Burman BlacksmithDori states that patient Oncologist would like to start patient on a new blood thinner medication. Appointment for today was made to address the new medication. According to patient's daughter, paperwork was faxed over by Oncologist last week.  Please forward to Dr. Raleigh CallasSharma and call patient's daughter with a follow up plan.     Daughter's contact info:  SLM CorporationDori Munn  [phone]

## 2017-11-04 ENCOUNTER — Telehealth

## 2017-11-04 NOTE — Telephone Encounter (Signed)
Patient's daughter on hippa [place](Angela James-all [phone]      Calling and states per [facility] they need an okay from you for continuation of therapy. Patient has appt on tomorrow.    Asked for number to [facility] and she didn't have. [facility] [address] [facility] [address]

## 2017-11-06 NOTE — Telephone Encounter (Signed)
Called and spoke with daughter.   Will place referral for continuation of PT.     Leanora IvanoffSandeep Jessalynn Mccowan, MD

## 2017-11-11 ENCOUNTER — Ambulatory Visit: Admit: 2017-11-11 | Discharge: 2017-11-11 | Payer: MEDICARE | Attending: Family Medicine | Primary: Family Medicine

## 2017-11-11 DIAGNOSIS — F32A Depression, unspecified: Secondary | ICD-10-CM

## 2017-11-11 MED ORDER — CALCIUM CARBONATE 600 MG-VITAMIN D3 10 MCG (400 UNIT) CAPSULE
600 mg-10 mcg (400 unit) | ORAL_CAPSULE | Freq: Every day | ORAL | 1 refills | Status: AC
Start: 2017-11-11 — End: ?

## 2017-11-11 NOTE — Progress Notes (Signed)
Chief Complaint   Patient presents with   ??? Depression

## 2017-11-11 NOTE — Progress Notes (Signed)
Subjective:      Ashley Drake is a 72 y.o. female who presents for evaluation of depression. Comes in with daughter today.   Reports that she has had decreased appetite over past two months.   Also reports that her mood has been decreased and has loss of interest in regular activities over this time.    Ongoing symptoms include depressed mood, anhedonia, weight loss and insomnia.  She denies feelings of worthlessness/guilt, difficulty concentrating, recurrent thoughts of death and suicidal thoughts without plan.   She experiences the following side effects from the treatment: none.  Symptoms are  gradually worsening.  She denies current suicidal and homicidal plan or intent.    She is currently on Effexor 37.5mg  for hot flashes.       S leep changes: Yes   I nterest (loss): Yes   G uilt (worthless):  No  E nergy (lack):Yes  C ognition/C oncentration: No  A ppetite (wt. Loss): Yes  P sychomotor: agitation: No  S uicide/death preocp: No      3 most recent PHQ Screens 11/11/2017   Little interest or pleasure in doing things More than half the days   Feeling down, depressed, irritable, or hopeless Nearly every day   Total Score PHQ 2 5   Trouble falling or staying asleep, or sleeping too much Not at all   Feeling tired or having little energy Not at all   Poor appetite, weight loss, or overeating Nearly every day   Feeling bad about yourself - or that you are a failure or have let yourself or your family down Not at all   Trouble concentrating on things such as school, work, reading, or watching TV Not at all   Moving or speaking so slowly that other people could have noticed; or the opposite being so fidgety that others notice More than half the days   Thoughts of being better off dead, or hurting yourself in some way Not at all   PHQ 9 Score 10   How difficult have these problems made it for you to do your work, take care of your home and get along with others Somewhat difficult           Review of Systems    Constitutional: Negative for chills and fever.   Respiratory: Negative for cough and shortness of breath.    Cardiovascular: Negative for chest pain and palpitations.   Psychiatric/Behavioral: Positive for depression. Negative for suicidal ideas. The patient has insomnia. The patient is not nervous/anxious.          PMHx:  Past Medical History:   Diagnosis Date   ??? Diabetes (HCC)    ??? Hypertension    ??? T.T.P. syndrome (HCC)        Meds:   Current Outpatient Medications   Medication Sig Dispense Refill   ??? Calcium-Cholecalciferol, D3, (CALCIUM 600 WITH VITAMIN D3) 600 mg(1,500mg ) -400 unit cap Take 1 Tab by mouth daily. 90 Cap 1   ??? ELIQUIS 2.5 mg tablet      ??? TRADJENTA 5 mg tablet      ??? NANO PEN NEEDLE 32 gauge x 5/32" ndle      ??? NOVOLOG MIX 70-30FLEXPEN U-100 100 unit/mL (70-30) inpn      ??? isosorbide mononitrate ER (IMDUR) 60 mg CR tablet      ??? venlafaxine-SR (EFFEXOR-XR) 37.5 mg capsule Take 1 Cap by mouth daily. 90 Cap 1   ??? fluconazole (DIFLUCAN) 50 mg tablet  Take 1 Tab by mouth daily. 30 Tab 0   ??? amLODIPine (NORVASC) 10 mg tablet TAKE 1 TABLET BY MOUTH ONCE DAILY 90 Tab 1   ??? carvedilol (COREG) 25 mg tablet TAKE 1 TABLET BY MOUTH TWICE DAILY WITH FOOD 180 Tab 1   ??? NOVOLOG FLEXPEN U-100 INSULIN 100 unit/mL inpn Please use as instructed 3 Adjustable Dose Pre-filled Pen Syringe 1   ??? predniSONE (DELTASONE) 5 mg tablet      ??? predniSONE (DELTASONE) 10 mg tablet      ??? glucose blood VI test strips (RELION PRIME TEST STRIPS) strip Please check blood sugars 4 times a day 200 Strip 2   ??? vit B Cmplx 3-FA-Vit C-Biotin (NEPHRO VITE RX) 1-60-300 mg-mg-mcg tablet Take 1 Tab by mouth daily. 90 Tab 0   ??? pantoprazole (PROTONIX) 40 mg tablet Take 1 Tab by mouth daily. 90 Tab 0       Allergies:   No Known Allergies    Smoker:  Social History     Tobacco Use   Smoking Status Never Smoker   Smokeless Tobacco Never Used       ETOH:   Social History     Substance and Sexual Activity   Alcohol Use No        FH: No family history on file.      Objective:     Visit Vitals  BP 140/80   Pulse 85   Temp 98.3 ??F (36.8 ??C)   Ht 4\' 11"  (1.499 m)   Wt 102 lb (46.3 kg)   SpO2 100%   BMI 20.60 kg/m??       GEN: No apparent distress. Ambulates with walker.   LUNGS: Respirations unlabored; clear to auscultation bilaterally  CARDIOVASCULAR: Regular, ratewithout murmurs, gallops or rubs   ABDOMEN: Soft; nontender; nondistended;                           Coordination and gait grossly intact.   PSYCH: alert, oriented to person, place, and time, depressed mood    Assessment:       ICD-10-CM ICD-9-CM    1. Depression, unspecified depression type F32.9 311      PHQ9-10  GAD7-0  Plan:     - Increase Effexor to 75mg  daily  - Will have patient return to the clinic in 2 week  - Referral to Psychology for cognitive therapy   - Instructed patient to contact office or on-call physician promptly should condition worsen or any new symptoms appear and provided on-call telephone numbers. IF THE PATIENT HAS ANY SUICIDAL OR HOMICIDAL IDEATION, CALL THE OFFICE, DISCUSS WITH A SUPPORT MEMBER OR GO TO THE ER IMMEDIATELY. Patient was agreeable with this plan    Patient is counseled to return to the office if symptoms do not improve as expected.  Urgent consultation with the nearest Emergency Department is strongly recommended if condition worsens.  Patient is counseled to follow up as recommended and to inform the office if any changes in treatment are recommended.            Signed By:  Leanora IvanoffSandeep Marigene Erler, MD    Family Medicine Resident            Signed By:  Leanora IvanoffSandeep Marjo Grosvenor, MD    Family Medicine Resident

## 2017-11-11 NOTE — Patient Instructions (Signed)
[place] [facility]:[address] [place] [address]  24 hour crisis line: [phone]  Daytime number: [phone]  Psychiatry, counseling, case management, drug/alcohol addiction    [facility] (Dr. Saleem): [email or web address]  [address], [place] [address]  Phone: [phone]  Fax: [phone]  Office Hours:  Mon: 10:00 am to 4:00 pm  Tue: 8:00 am to 6:00 pm  Wed-Thur: 8:00 am to 7:00 pm    [facility]: [email or web address]  [place] ([phone]),  [place] ([phone])  [place] ([phone])  [place] ([phone])  [facility]  Addiction/Substance abuse   [place]: [phone]  [place]: [phone]    [facility]: [email or web address]  [address], [place] [address]  [address] [place] [address], [place] [address]  Ph. [phone] Fax [phone]    [facility]: [email or web address]  [address] ([phone])  [place] ([phone])    [facility]  [address]  [address], [address]  [place] [address]  Phone: [phone]  Fax: [phone]    [facility]  [address]   Medical Office Building, [address]  [place] [address]  Phone: [phone]  Fax: [phone]    Adam Kaul, M.D.  [place], [place]  [phone]    Martin Buxton, M.D.  [facility]  [phone]    [facility]  [address], [address]  [place], [place]  [phone]  [phone]

## 2017-11-11 NOTE — Progress Notes (Signed)
I reviewed with the resident the medical history and the resident's findings on the physical examination.  I discussed with the resident the patient's diagnosis and concur with the plan.

## 2017-11-19 ENCOUNTER — Encounter: Attending: Family Medicine | Primary: Family Medicine

## 2017-11-25 ENCOUNTER — Encounter: Attending: Family Medicine | Primary: Family Medicine

## 2017-12-15 ENCOUNTER — Encounter: Admit: 2017-12-15 | Primary: Family Medicine

## 2017-12-15 ENCOUNTER — Ambulatory Visit: Admit: 2017-12-15 | Payer: MEDICARE | Attending: Internal Medicine | Primary: Family Medicine

## 2017-12-15 DIAGNOSIS — M79672 Pain in left foot: Secondary | ICD-10-CM

## 2017-12-15 NOTE — Progress Notes (Signed)
Ashley Drake is a 72 y.o. female who presents with a chief complaint of left foot swelling since yesterday.  Patient notes that it stated up yesterday on the inside of the foot without any clear precipitating injury.  Patient walks with a walker, and notes that she's been more ambulatory than usual over the last few weeks.  Patient reports some pain on the inside part of the foot.  Denies any swelling up the calf, or pain above the area of the ankle.  Patient reports that she doesn't have a history of ankle issues.  Appointment was initially made for left hand swelling, although that went away without issue spontaneously.  No other joint pains.     Meds:    Current Outpatient Medications:   ???  Calcium-Cholecalciferol, D3, (CALCIUM 600 WITH VITAMIN D3) 600 mg(1,500mg ) -400 unit cap, Take 1 Tab by mouth daily., Disp: 90 Cap, Rfl: 1  ???  ELIQUIS 2.5 mg tablet, , Disp: , Rfl:   ???  TRADJENTA 5 mg tablet, , Disp: , Rfl:   ???  NANO PEN NEEDLE 32 gauge x 5/32" ndle, , Disp: , Rfl:   ???  NOVOLOG MIX 70-30FLEXPEN U-100 100 unit/mL (70-30) inpn, , Disp: , Rfl:   ???  isosorbide mononitrate ER (IMDUR) 60 mg CR tablet, , Disp: , Rfl:   ???  venlafaxine-SR (EFFEXOR-XR) 37.5 mg capsule, Take 1 Cap by mouth daily., Disp: 90 Cap, Rfl: 1  ???  fluconazole (DIFLUCAN) 50 mg tablet, Take 1 Tab by mouth daily., Disp: 30 Tab, Rfl: 0  ???  amLODIPine (NORVASC) 10 mg tablet, TAKE 1 TABLET BY MOUTH ONCE DAILY, Disp: 90 Tab, Rfl: 1  ???  carvedilol (COREG) 25 mg tablet, TAKE 1 TABLET BY MOUTH TWICE DAILY WITH FOOD, Disp: 180 Tab, Rfl: 1  ???  NOVOLOG FLEXPEN U-100 INSULIN 100 unit/mL inpn, Please use as instructed, Disp: 3 Adjustable Dose Pre-filled Pen Syringe, Rfl: 1  ???  predniSONE (DELTASONE) 5 mg tablet, , Disp: , Rfl:   ???  predniSONE (DELTASONE) 10 mg tablet, , Disp: , Rfl:   ???  glucose blood VI test strips (RELION PRIME TEST STRIPS) strip, Please check blood sugars 4 times a day, Disp: 200 Strip, Rfl: 2   ???  vit B Cmplx 3-FA-Vit C-Biotin (NEPHRO VITE RX) 1-60-300 mg-mg-mcg tablet, Take 1 Tab by mouth daily., Disp: 90 Tab, Rfl: 0  ???  pantoprazole (PROTONIX) 40 mg tablet, Take 1 Tab by mouth daily., Disp: 90 Tab, Rfl: 0   Allergies:  No Known Allergies  Smoker:   Social History     Tobacco Use   Smoking Status Never Smoker   Smokeless Tobacco Never Used     ETOH:   Social History     Substance and Sexual Activity   Alcohol Use No       FH:    No family history on file.    ROS:  General/Constitutional:  No headache, fever, fatigue  Eyes:  No redness, pruritis, pain, visual changes  Ears:  No pain, loss or changes in hearing  Neck:  No swelling, masses, stiffness, pain, or limited movement  Cardiac:   No chest pain, palpitations  Respiratory:  No cough or shortness of breath    GI:  No nausea/vomiting, diarrhea, abdominal pain, bloody or dark stools             Physical Exam:  Visit Vitals  BP 137/80   Pulse 83   Temp 98 ??F (36.7 ??C)  Resp 18   Ht 4\' 11"  (1.499 m)   Wt 104 lb (47.2 kg)   SpO2 98%   BMI 21.01 kg/m??       Ankle/Foot: left  Ankle Effusion: None  Great Toe MTP (normal/valgus/hallux rigidis/varus): Normal  No edema or calf pain noted.     ROM:  Full ROM    Dynamic Test:  Gait: With walker  Thompson test: Normal    Palpation:  ATFL tenderness: None  CFL tenderness: None  PTFL tenderness: None  Deltoid tenderness: None  Achilles insertion tenderness: None   Achilles tendon tenderness: None   Great Toe IP joint tenderness: None  Great Toe MTP join tenderness t: None  Lisfranc Joint tenderness: None  Medial Malleolus tenderness: None  Lateral Malleolus tenderness: None  5th Metatarsal tenderness: None  1st Metatarsal tenderness: Mild  FHL tenderness: Mild  Navicular tenderness: None  Plantar fascia heel tenderness: None    Instability:  Anterior Drawer: Normal  Talar Tilt: Normal  Syndesmosis Tenderness: None  External Rotation Test: Normal  Squeeze Test: Normal  Peroneal Subluxation: Normal     Strength (0-5/5):   Plantarflexion: 5/5  Dorsiflexion: 5/5  Inversion: 5/5  Eversion: 5/5  FHL: 5/5  EHL: 5/5    X-rays of the left foot were performed and personally reviewed by me, with the following notations: Some mild degenerative changes, particularly at the medial cuneform/1st metatarsal joint.  No fracture noted        Assessment/Plan:    ICD-10-CM ICD-9-CM    1. Left foot pain M79.672 729.5 XR FOOT LT MIN 3 V     Patient with a pain in the left foot, with some mild swelling around the base of the 1st metatarsal.  Likely related to the degenerative changes/arthritis noted on XR.  Discussed with patient/daughter tylenol for treatment, as well as RICE.  Patient and daughter comfortable with this.

## 2017-12-15 NOTE — Progress Notes (Signed)
I reviewed the patient's medical history, the resident's findings on physical examination, the patient's diagnoses, and treatment plan as documented in the resident note.  I concur with the treatment plan as documented.  Additional suggestions noted.

## 2017-12-15 NOTE — Progress Notes (Signed)
Chief Complaint   Patient presents with   ??? Foot Swelling     Left foot swelling since earlier today; appt was initially made due to left hand swelling but swelling has decreased

## 2018-02-01 ENCOUNTER — Encounter

## 2018-02-05 MED ORDER — PANTOPRAZOLE 40 MG TAB, DELAYED RELEASE
40 mg | ORAL_TABLET | ORAL | 0 refills | Status: AC
Start: 2018-02-05 — End: ?

## 2019-07-12 DIAGNOSIS — E1142 Type 2 diabetes mellitus with diabetic polyneuropathy: Secondary | ICD-10-CM | POA: Insufficient documentation

## 2019-07-12 DIAGNOSIS — I6350 Cerebral infarction due to unspecified occlusion or stenosis of unspecified cerebral artery: Secondary | ICD-10-CM | POA: Insufficient documentation

## 2019-07-12 DIAGNOSIS — F0280 Dementia in other diseases classified elsewhere without behavioral disturbance: Secondary | ICD-10-CM | POA: Insufficient documentation

## 2019-07-12 HISTORY — DX: Cerebral infarction due to unspecified occlusion or stenosis of unspecified cerebral artery: I63.50

## 2022-12-12 ENCOUNTER — Emergency Department (HOSPITAL_COMMUNITY): Payer: 59

## 2022-12-12 ENCOUNTER — Inpatient Hospital Stay (HOSPITAL_COMMUNITY)
Admission: EM | Admit: 2022-12-12 | Discharge: 2022-12-25 | DRG: 444 | Disposition: A | Discharge status: Skilled Nursing Facility | Payer: 59 | Attending: Internal Medicine | Admitting: Internal Medicine

## 2022-12-12 ENCOUNTER — Other Ambulatory Visit: Payer: Self-pay

## 2022-12-12 ENCOUNTER — Encounter (HOSPITAL_COMMUNITY): Payer: Self-pay | Admitting: Emergency Medicine

## 2022-12-12 DIAGNOSIS — D696 Thrombocytopenia, unspecified: Secondary | ICD-10-CM | POA: Diagnosis not present

## 2022-12-12 DIAGNOSIS — Z931 Gastrostomy status: Secondary | ICD-10-CM

## 2022-12-12 DIAGNOSIS — I2510 Atherosclerotic heart disease of native coronary artery without angina pectoris: Secondary | ICD-10-CM | POA: Diagnosis present

## 2022-12-12 DIAGNOSIS — I517 Cardiomegaly: Secondary | ICD-10-CM

## 2022-12-12 DIAGNOSIS — Z7401 Bed confinement status: Secondary | ICD-10-CM | POA: Diagnosis not present

## 2022-12-12 DIAGNOSIS — N250 Renal osteodystrophy: Secondary | ICD-10-CM | POA: Diagnosis not present

## 2022-12-12 DIAGNOSIS — R0609 Other forms of dyspnea: Secondary | ICD-10-CM | POA: Diagnosis not present

## 2022-12-12 DIAGNOSIS — I639 Cerebral infarction, unspecified: Secondary | ICD-10-CM | POA: Diagnosis not present

## 2022-12-12 DIAGNOSIS — N186 End stage renal disease: Secondary | ICD-10-CM | POA: Diagnosis not present

## 2022-12-12 DIAGNOSIS — I5022 Chronic systolic (congestive) heart failure: Secondary | ICD-10-CM | POA: Diagnosis present

## 2022-12-12 DIAGNOSIS — E871 Hypo-osmolality and hyponatremia: Secondary | ICD-10-CM | POA: Diagnosis not present

## 2022-12-12 DIAGNOSIS — Z9049 Acquired absence of other specified parts of digestive tract: Secondary | ICD-10-CM | POA: Diagnosis not present

## 2022-12-12 DIAGNOSIS — Z79899 Other long term (current) drug therapy: Secondary | ICD-10-CM

## 2022-12-12 DIAGNOSIS — I10 Essential (primary) hypertension: Secondary | ICD-10-CM | POA: Diagnosis present

## 2022-12-12 DIAGNOSIS — R0902 Hypoxemia: Secondary | ICD-10-CM | POA: Diagnosis not present

## 2022-12-12 DIAGNOSIS — E11649 Type 2 diabetes mellitus with hypoglycemia without coma: Secondary | ICD-10-CM | POA: Diagnosis not present

## 2022-12-12 DIAGNOSIS — Z6827 Body mass index (BMI) 27.0-27.9, adult: Secondary | ICD-10-CM

## 2022-12-12 DIAGNOSIS — Z993 Dependence on wheelchair: Secondary | ICD-10-CM

## 2022-12-12 DIAGNOSIS — Z515 Encounter for palliative care: Secondary | ICD-10-CM | POA: Diagnosis not present

## 2022-12-12 DIAGNOSIS — N2581 Secondary hyperparathyroidism of renal origin: Secondary | ICD-10-CM | POA: Diagnosis present

## 2022-12-12 DIAGNOSIS — E119 Type 2 diabetes mellitus without complications: Secondary | ICD-10-CM

## 2022-12-12 DIAGNOSIS — I132 Hypertensive heart and chronic kidney disease with heart failure and with stage 5 chronic kidney disease, or end stage renal disease: Secondary | ICD-10-CM | POA: Diagnosis present

## 2022-12-12 DIAGNOSIS — R0602 Shortness of breath: Secondary | ICD-10-CM | POA: Diagnosis present

## 2022-12-12 DIAGNOSIS — R627 Adult failure to thrive: Secondary | ICD-10-CM | POA: Diagnosis not present

## 2022-12-12 DIAGNOSIS — I953 Hypotension of hemodialysis: Secondary | ICD-10-CM | POA: Diagnosis not present

## 2022-12-12 DIAGNOSIS — R1013 Epigastric pain: Secondary | ICD-10-CM | POA: Diagnosis present

## 2022-12-12 DIAGNOSIS — K8001 Calculus of gallbladder with acute cholecystitis with obstruction: Principal | ICD-10-CM | POA: Diagnosis present

## 2022-12-12 DIAGNOSIS — Z95828 Presence of other vascular implants and grafts: Secondary | ICD-10-CM | POA: Diagnosis not present

## 2022-12-12 DIAGNOSIS — Z992 Dependence on renal dialysis: Secondary | ICD-10-CM | POA: Diagnosis not present

## 2022-12-12 DIAGNOSIS — R1310 Dysphagia, unspecified: Secondary | ICD-10-CM | POA: Diagnosis present

## 2022-12-12 DIAGNOSIS — D631 Anemia in chronic kidney disease: Secondary | ICD-10-CM | POA: Diagnosis not present

## 2022-12-12 DIAGNOSIS — I69354 Hemiplegia and hemiparesis following cerebral infarction affecting left non-dominant side: Secondary | ICD-10-CM

## 2022-12-12 DIAGNOSIS — K810 Acute cholecystitis: Secondary | ICD-10-CM | POA: Diagnosis not present

## 2022-12-12 DIAGNOSIS — F0390 Unspecified dementia without behavioral disturbance: Secondary | ICD-10-CM | POA: Diagnosis present

## 2022-12-12 DIAGNOSIS — E1122 Type 2 diabetes mellitus with diabetic chronic kidney disease: Secondary | ICD-10-CM | POA: Diagnosis present

## 2022-12-12 DIAGNOSIS — Z86718 Personal history of other venous thrombosis and embolism: Secondary | ICD-10-CM

## 2022-12-12 DIAGNOSIS — E876 Hypokalemia: Secondary | ICD-10-CM | POA: Diagnosis not present

## 2022-12-12 DIAGNOSIS — E118 Type 2 diabetes mellitus with unspecified complications: Secondary | ICD-10-CM | POA: Diagnosis not present

## 2022-12-12 DIAGNOSIS — I69921 Dysphasia following unspecified cerebrovascular disease: Secondary | ICD-10-CM | POA: Diagnosis not present

## 2022-12-12 DIAGNOSIS — Z7189 Other specified counseling: Secondary | ICD-10-CM | POA: Diagnosis not present

## 2022-12-12 DIAGNOSIS — I82729 Chronic embolism and thrombosis of deep veins of unspecified upper extremity: Secondary | ICD-10-CM | POA: Diagnosis not present

## 2022-12-12 DIAGNOSIS — Z743 Need for continuous supervision: Secondary | ICD-10-CM | POA: Diagnosis not present

## 2022-12-12 DIAGNOSIS — Z7901 Long term (current) use of anticoagulants: Secondary | ICD-10-CM

## 2022-12-12 DIAGNOSIS — R410 Disorientation, unspecified: Secondary | ICD-10-CM | POA: Diagnosis not present

## 2022-12-12 DIAGNOSIS — R109 Unspecified abdominal pain: Secondary | ICD-10-CM | POA: Diagnosis not present

## 2022-12-12 DIAGNOSIS — I120 Hypertensive chronic kidney disease with stage 5 chronic kidney disease or end stage renal disease: Secondary | ICD-10-CM | POA: Diagnosis not present

## 2022-12-12 DIAGNOSIS — Z794 Long term (current) use of insulin: Secondary | ICD-10-CM

## 2022-12-12 HISTORY — DX: Dependence on renal dialysis: Z99.2

## 2022-12-12 HISTORY — DX: Heart failure, unspecified: I50.9

## 2022-12-12 HISTORY — DX: Unspecified dementia, unspecified severity, without behavioral disturbance, psychotic disturbance, mood disturbance, and anxiety: F03.90

## 2022-12-12 HISTORY — DX: Disorder of kidney and ureter, unspecified: N28.9

## 2022-12-12 HISTORY — DX: Essential (primary) hypertension: I10

## 2022-12-12 HISTORY — DX: End stage renal disease: N18.6

## 2022-12-12 HISTORY — DX: Gastrostomy status: Z93.1

## 2022-12-12 HISTORY — DX: Disorder of thyroid, unspecified: E07.9

## 2022-12-12 HISTORY — DX: Cerebral infarction, unspecified: I63.9

## 2022-12-12 LAB — CBC WITH DIFFERENTIAL/PLATELET
Abs Immature Granulocytes: 0.13 10*3/uL — ABNORMAL HIGH (ref 0.00–0.07)
Basophils Absolute: 0 10*3/uL (ref 0.0–0.1)
Basophils Relative: 0 %
Eosinophils Absolute: 0.2 10*3/uL (ref 0.0–0.5)
Eosinophils Relative: 2 %
HCT: 35.1 % — ABNORMAL LOW (ref 36.0–46.0)
Hemoglobin: 11.1 g/dL — ABNORMAL LOW (ref 12.0–15.0)
Immature Granulocytes: 1 %
Lymphocytes Relative: 28 %
Lymphs Abs: 2.8 10*3/uL (ref 0.7–4.0)
MCH: 29.4 pg (ref 26.0–34.0)
MCHC: 31.6 g/dL (ref 30.0–36.0)
MCV: 92.9 fL (ref 80.0–100.0)
Monocytes Absolute: 0.9 10*3/uL (ref 0.1–1.0)
Monocytes Relative: 8 %
Neutro Abs: 6.1 10*3/uL (ref 1.7–7.7)
Neutrophils Relative %: 61 %
Platelets: 176 10*3/uL (ref 150–400)
RBC: 3.78 MIL/uL — ABNORMAL LOW (ref 3.87–5.11)
RDW: 17.9 % — ABNORMAL HIGH (ref 11.5–15.5)
WBC: 10.1 10*3/uL (ref 4.0–10.5)
nRBC: 0.2 % (ref 0.0–0.2)

## 2022-12-12 LAB — MAGNESIUM: Magnesium: 2.4 mg/dL (ref 1.7–2.4)

## 2022-12-12 LAB — COMPREHENSIVE METABOLIC PANEL
ALT: 26 U/L (ref 0–44)
AST: 27 U/L (ref 15–41)
Albumin: 4.2 g/dL (ref 3.5–5.0)
Alkaline Phosphatase: 63 U/L (ref 38–126)
Anion gap: 16 — ABNORMAL HIGH (ref 5–15)
BUN: 39 mg/dL — ABNORMAL HIGH (ref 8–23)
CO2: 23 mmol/L (ref 22–32)
Calcium: 9.7 mg/dL (ref 8.9–10.3)
Chloride: 95 mmol/L — ABNORMAL LOW (ref 98–111)
Creatinine, Ser: 2.6 mg/dL — ABNORMAL HIGH (ref 0.44–1.00)
GFR, Estimated: 19 mL/min — ABNORMAL LOW (ref >60)
Glucose, Bld: 166 mg/dL — ABNORMAL HIGH (ref 70–99)
Potassium: 3.6 mmol/L (ref 3.5–5.1)
Sodium: 134 mmol/L — ABNORMAL LOW (ref 135–145)
Total Bilirubin: 0.5 mg/dL (ref 0.3–1.2)
Total Protein: 8.1 g/dL (ref 6.5–8.1)

## 2022-12-12 LAB — CBG MONITORING, ED: Glucose-Capillary: 134 mg/dL — ABNORMAL HIGH (ref 70–99)

## 2022-12-12 MED ORDER — MELATONIN 3 MG PO TABS
3.0000 mg | ORAL_TABLET | Freq: Every evening | ORAL | Status: DC | PRN
  Administered 2022-12-13 – 2022-12-24 (×5): 3 mg via ORAL
  Filled 2022-12-12 (×6): qty 1

## 2022-12-12 MED ORDER — ACETAMINOPHEN 650 MG RE SUPP: 650.0000 mg | Freq: Four times a day (QID) | RECTAL | Status: DC | PRN

## 2022-12-12 MED ORDER — ACETAMINOPHEN 325 MG PO TABS
650.0000 mg | ORAL_TABLET | Freq: Four times a day (QID) | ORAL | Status: DC | PRN
  Administered 2022-12-13 – 2022-12-20 (×7): 650 mg via ORAL
  Filled 2022-12-12 (×8): qty 2

## 2022-12-12 MED ORDER — ONDANSETRON HCL 4 MG/2ML IJ SOLN
4.0000 mg | Freq: Four times a day (QID) | INTRAMUSCULAR | Status: DC | PRN
  Administered 2022-12-15 (×2): 4 mg via INTRAVENOUS
  Filled 2022-12-12 (×2): qty 2

## 2022-12-12 MED ORDER — FENTANYL CITRATE PF 50 MCG/ML IJ SOSY: 25.0000 ug | PREFILLED_SYRINGE | INTRAMUSCULAR | Status: DC | PRN

## 2022-12-12 MED ORDER — NALOXONE HCL 0.4 MG/ML IJ SOLN: 0.4000 mg | INTRAMUSCULAR | Status: DC | PRN

## 2022-12-12 NOTE — ED Provider Triage Note (Signed)
Emergency Medicine Provider Triage Evaluation Note  Samantha [name] , a 77 y.o. female  was evaluated in triage.  Pt complains of shortness of breath. She states that that same began when she woke up this morning at her home in [place]. She then flew down here and during the flight her shortness of breath resolved, however when she got here she told family she had been short of breath they insisted she come to the ER for evaluation. Patient is ESRD on hemodialysis and was last dialyzed yesterday. She currently denies any symptoms. States when she was symptomatic she did not have chest pain or cough, no nausea or vomiting. She denies leg pain or leg swelling.   Review of Systems  Positive:  Negative:   Physical Exam  BP (!) 135/53 (BP Location: Left Arm)   Pulse 90   Temp 99 F (37.2 C) (Oral)   Resp 20   Ht  (1.499 m)   Wt 63.5 kg   SpO2 99%   BMI 28.28 kg/m  Gen:   Awake, no distress   Resp:  Normal effort  MSK:   Moves extremities without difficulty  Other:    Medical Decision Making  Medically screening exam initiated at 11:47 AM.  Appropriate orders placed.  Rekisha Cabanilla was informed that the remainder of the evaluation will be completed by another provider, this initial triage assessment does not replace that evaluation, and the importance of remaining in the ED until their evaluation is complete.     Silva Bandy, PA-C 12/12/22 1150

## 2022-12-12 NOTE — ED Notes (Signed)
Two IV attempts on only available arm unsuccessful, consulting another RN

## 2022-12-12 NOTE — ED Provider Notes (Signed)
[place] EMERGENCY DEPARTMENT AT [facility] Provider Note   CSN: [id number] Arrival date & time: 12/12/22  1128     History  Chief Complaint  Patient presents with   Shortness of Breath    Samantha [name] is a 77 y.o. female with past medical history hypertension, diabetes, ESRD on HD, previous CVA with left-sided residual weakness, dementia, heart failure who presents to the ED for evaluation.  Per patient and daughters at bedside, patient is here for abdominal pain.  Patient tells me she has had this abdominal pain for the last week and notes it is in the upper abdomen.  She does have a history of PEG tube placement due to failure to thrive following previous stroke.  She takes minimal food by mouth but is sustained with nutrition through the PEG tube.  Patient denies recent nausea, vomiting, diarrhea, or fever.  Daughters state that patient was evaluated for her abdominal pain in [place] but it has been sometime ago and was not within the last week since this episode began.  Patient has not had any previous abdominal surgeries.  Patient denies chest pain or shortness of breath.  Patient and daughters confirmed that patient last dialyzed yesterday.  Her typical schedule is Monday Wednesday Friday.  She has arranged for nephrology follow-up locally due to now moving to the area.      Home Medications Prior to Admission medications   Medication Sig Start Date End Date Taking? Authorizing Provider  amLODipine (NORVASC) 10 MG tablet Take 10-15.5 mg by mouth 2 (two) times daily. One in the morning and 1 and half tablets in the evening    [provider]  bisacodyl (DULCOLAX) 5 MG EC tablet Take 5 mg by mouth daily as needed for moderate constipation.    [provider]  carvedilol (COREG) 25 MG tablet Take 25 mg by mouth 2 (two) times daily with a meal.    [provider]  cholecalciferol (VITAMIN D) 1000 UNITS tablet Take 1,000 Units by mouth daily.     [provider]  Cod Liver Oil 1000 MG CAPS Take 1 capsule by mouth daily.    [provider]  insulin glargine (LANTUS) 100 UNIT/ML injection Inject 25 Units into the skin at bedtime.    [provider]  insulin lispro (HUMALOG) 100 UNIT/ML injection Inject 19 Units into the skin 2 (two) times daily. 12 units before lunch and 7 units before supper    [provider]  losartan-hydrochlorothiazide (HYZAAR) 100-25 MG per tablet Take 1 tablet by mouth daily.    [provider]  traMADol (ULTRAM) 50 MG tablet Take 1 tablet (50 mg total) by mouth every 6 (six) hours as needed. 11/08/13   Junious Silk, PA-C      Allergies    Patient has no known allergies.    Review of Systems   Review of Systems  Unable to perform ROS: Dementia    Physical Exam Updated Vital Signs BP (!) 135/53 (BP Location: Left Arm)   Pulse 90   Temp 99 F (37.2 C) (Oral)   Resp 20   Ht 4\' 11"  (1.499 m)   Wt 63.5 kg   SpO2 99%   BMI 28.28 kg/m  Physical Exam Vitals and nursing note reviewed.  Constitutional:      General: She is not in acute distress.    Appearance: She is not ill-appearing, toxic-appearing or diaphoretic.     Interventions: She is not intubated. HENT:  Head: Normocephalic and atraumatic.     Mouth/Throat:     Mouth: Mucous membranes are moist.     Pharynx: Oropharynx is clear.  Eyes:     Extraocular Movements: Extraocular movements intact.     Pupils: Pupils are equal, round, and reactive to light.  Cardiovascular:     Rate and Rhythm: Normal rate and regular rhythm.  Pulmonary:     Effort: Pulmonary effort is normal. No tachypnea, bradypnea, accessory muscle usage or respiratory distress. She is not intubated.     Breath sounds: Normal breath sounds. No stridor. No decreased breath sounds, wheezing, rhonchi or rales.  Chest:     Chest wall: No mass, deformity, tenderness, crepitus or edema.  Abdominal:     Palpations: Abdomen is soft.  There is no shifting dullness, fluid wave, mass or pulsatile mass.     Tenderness: There is generalized abdominal tenderness. There is no right CVA tenderness, left CVA tenderness, guarding or rebound. Negative signs include Murphy's sign, Rovsing's sign and McBurney's sign.     Comments: PEG tube in place, no surrounding signs of infection  Musculoskeletal:     Cervical back: Normal range of motion and neck supple.     Right lower leg: No tenderness. No edema.     Left lower leg: No tenderness. No edema.  Skin:    General: Skin is warm and dry.     Capillary Refill: Capillary refill takes less than 2 seconds.     Findings: No rash.  Neurological:     Mental Status: She is alert. Mental status is at baseline.     GCS: GCS eye subscore is 4. GCS verbal subscore is 5. GCS motor subscore is 6.  Psychiatric:        Mood and Affect: Mood normal.        Behavior: Behavior normal.     ED Results / Procedures / Treatments   Labs (all labs ordered are listed, but only abnormal results are displayed) Labs Reviewed  COMPREHENSIVE METABOLIC PANEL  URINALYSIS, ROUTINE W REFLEX MICROSCOPIC  LACTIC ACID, PLASMA  LACTIC ACID, PLASMA  MAGNESIUM  CBC  CBC WITH DIFFERENTIAL/PLATELET  COMPREHENSIVE METABOLIC PANEL    EKG EKG Interpretation  Date/Time:  Saturday December 12 2022 11:42:31 EDT Ventricular Rate:  82 PR Interval:  174 QRS Duration: 86 QT Interval:  418 QTC Calculation: 488 R Axis:   243 Text Interpretation: Normal sinus rhythm Right superior axis deviation Abnormal ECG No previous ECGs available Confirmed by Kristine Royal [id number]) on 12/12/2022 12:13:15 PM  Radiology DG Chest 2 View  Result Date: 12/12/2022 CLINICAL DATA:  Shortness of breath. EXAM: CHEST - 2 VIEW COMPARISON:  None Available. FINDINGS: Heart is enlarged. A right subclavian dialysis catheter is in place. The tip turns medially and may be near the mitral valve. No edema or effusion is present. No focal airspace  disease is present. The lung volumes are low. IVC filter is noted. Degenerative changes are present in thoracic spine. Surgical clips are present in the left axilla. IMPRESSION: 1. Cardiomegaly without failure. 2. No acute cardiopulmonary disease. 3. Right subclavian dialysis catheter is in place. Electronically Signed   By: Marin Roberts M.D.   On: 12/12/2022 12:18    Procedures Procedures    Medications Ordered in ED Medications - No data to display  ED Course/ Medical Decision Making/ A&P  Medical Decision Making Amount and/or Complexity of Data Reviewed Labs: ordered. Decision-making details documented in ED Course. Radiology: ordered. Decision-making details documented in ED Course. ECG/medicine tests: ordered. Decision-making details documented in ED Course.   Medical Decision Making:   Samantha [name] is a 77 y.o. female who presented to the ED today with abdominal pain detailed above.    Additional history discussed with patient's family/caregivers.  Patient's presentation is complicated by their history of CVA, diabetes, ESRD, heart failure, hypertension, dementia.  Patient placed on continuous vitals and telemetry monitoring while in ED which was reviewed periodically.  Complete initial physical exam performed, notably the patient  was in no acute distress.  Someone answering questions appropriately but does have a history of dementia so history was verified with daughters.   Nonfocal neuroexam.  Abdomen soft, nontender, nondistended.  PEG tube in place.  No surrounding signs of infection.  No CVA tenderness.  Lungs clear to auscultation.  No significant lower extremity edema. Reviewed and confirmed nursing documentation for past medical history, family history, social history.    Initial Assessment:   With the patient's presentation of abdominal pain, differential diagnosis includes but is not limited to AAA, mesenteric ischemia, appendicitis,  diverticulitis, DKA, gastritis, gastroenteritis, AMI, nephrolithiasis, pancreatitis, peritonitis, adrenal insufficiency, intestinal ischemia, constipation, UTI, SBO/LBO, splenic rupture, biliary disease, IBD, IBS, PUD, hepatitis, STD, ovarian/testicular torsion, electrolyte disturbance, DKA, dehydration, acute kidney injury, renal failure, cholecystitis, cholelithiasis, choledocholithiasis, abdominal pain of  unknown etiology.    Initial Plan:  Screening labs including CBC and Metabolic panel to evaluate for infectious or metabolic etiology of disease.  Lipase to evaluate for pancreatitis Urinalysis with reflex culture ordered to evaluate for UTI or relevant urologic/nephrologic pathology.  CT abd/pelvis to evaluate for intra-abdominal pathology EKG to evaluate for cardiac pathology Symptomatic management Objective evaluation as reviewed   Initial Study Results:   Laboratory  Pending  EKG EKG was reviewed independently. ST segments without concerns for elevations.   EKG: normal sinus rhythm.   Radiology:  All images reviewed independently. Agree with radiology report at this time.   DG Chest 2 View  Result Date: 12/12/2022 CLINICAL DATA:  Shortness of breath. EXAM: CHEST - 2 VIEW COMPARISON:  None Available. FINDINGS: Heart is enlarged. A right subclavian dialysis catheter is in place. The tip turns medially and may be near the mitral valve. No edema or effusion is present. No focal airspace disease is present. The lung volumes are low. IVC filter is noted. Degenerative changes are present in thoracic spine. Surgical clips are present in the left axilla. IMPRESSION: 1. Cardiomegaly without failure. 2. No acute cardiopulmonary disease. 3. Right subclavian dialysis catheter is in place. Electronically Signed   By: Marin Roberts M.D.   On: 12/12/2022 12:18    Final Assessment and Plan:   77 year old female presents to the ED with daughters for evaluation of abdominal pain.  No  associated nausea, vomiting, diarrhea, or fever.  Patient denies chest pain or shortness of breath.  Daughters confirm that patient is here for evaluation of abdominal pain.  Patient reports evaluation in [place].  Unable to see these results through care everywhere and doctors also unaware of results.  Patient denies previous imaging.  History ESRD on HD with last session Friday.  No recent missed sessions.  On exam, patient does not appear volume overloaded.  No previous abdominal surgeries.  Patient does have PEG tube in place which she uses to supplement nutrition.  No surrounding signs of infection.  Mild diffuse abdominal tenderness.  No peritoneal signs.  Does not appear to be consistent with acute abdomen but difficult to assess in the setting of patient's dementia.  Workup obtained as above for further assessment.  Unfortunately, unable to obtain blood work with multiple nursing attempts including IV team consult.  At this time, we will proceed with CT abdomen pelvis without contrast for further evaluation.  Will have phlebotomy continued to attempt to obtain blood work.  Patient care transitioned at shift change to oncoming PA-C, Melton Alar, pending remainder of workup and disposition.  Suspect that if scan is negative and blood work reassuring that patient can be discharged home as family seems reliable and patient has established outpatient care locally.   Clinical Impression:  1. Generalized abdominal pain   2. ESRD (end stage renal disease) on dialysis   3. Cardiomegaly      Data Unavailable           Final Clinical Impression(s) / ED Diagnoses Final diagnoses:  Generalized abdominal pain  ESRD (end stage renal disease) on dialysis  Cardiomegaly    Rx / DC Orders ED Discharge Orders     None         Richardson Dopp 12/12/22 1535    Wynetta Fines, MD 12/12/22 1545

## 2022-12-12 NOTE — ED Triage Notes (Signed)
Per [facility] pt coming from air port- had dialysis yesterday back home. When landed began feeling short of breath. States symptoms have resolved however family still wanting patient to be evaluated.

## 2022-12-12 NOTE — ED Provider Notes (Signed)
Accepted handoff at shift change from [facility], PA-C. Please see prior provider note for more detail.   Briefly: Patient is 77 y.o. with history of ESRD on HD, CVA, dementia, CHF presents to ED complaining of abdominal pain.  Patient has had upper abdominal pain for the past week.  She has PEG tube in place for nutrition.  Daughters at bedside state patient has been evaluated in [place] previously for abdominal pain, but not most recent episode.  Patient is a M/W/F dialysis patient, was dialyzed yesterday.  She just moved down to [place], and has arranged for nephrology follow-up and dialysis in the area.     Results for orders placed or performed during the hospital encounter of 12/12/22  Magnesium  Result Value Ref Range   Magnesium 2.4 1.7 - 2.4 mg/dL  CBC with Differential  Result Value Ref Range   WBC 10.1 4.0 - 10.5 K/uL   RBC 3.78 (L) 3.87 - 5.11 MIL/uL   Hemoglobin 11.1 (L) 12.0 - 15.0 g/dL   HCT 62.1 (L) 30.8 - 65.7 %   MCV 92.9 80.0 - 100.0 fL   MCH 29.4 26.0 - 34.0 pg   MCHC 31.6 30.0 - 36.0 g/dL   RDW 84.6 (H) 96.2 - 95.2 %   Platelets 176 150 - 400 K/uL   nRBC 0.2 0.0 - 0.2 %   Neutrophils Relative % 61 %   Neutro Abs 6.1 1.7 - 7.7 K/uL   Lymphocytes Relative 28 %   Lymphs Abs 2.8 0.7 - 4.0 K/uL   Monocytes Relative 8 %   Monocytes Absolute 0.9 0.1 - 1.0 K/uL   Eosinophils Relative 2 %   Eosinophils Absolute 0.2 0.0 - 0.5 K/uL   Basophils Relative 0 %   Basophils Absolute 0.0 0.0 - 0.1 K/uL   Immature Granulocytes 1 %   Abs Immature Granulocytes 0.13 (H) 0.00 - 0.07 K/uL  Comprehensive metabolic panel  Result Value Ref Range   Sodium 134 (L) 135 - 145 mmol/L   Potassium 3.6 3.5 - 5.1 mmol/L   Chloride 95 (L) 98 - 111 mmol/L   CO2 23 22 - 32 mmol/L   Glucose, Bld 166 (H) 70 - 99 mg/dL   BUN 39 (H) 8 - 23 mg/dL   Creatinine, Ser 8.41 (H) 0.44 - 1.00 mg/dL   Calcium 9.7 8.9 - 32.4 mg/dL   Total Protein 8.1 6.5 - 8.1 g/dL   Albumin 4.2 3.5 - 5.0 g/dL   AST 27  15 - 41 U/L   ALT 26 0 - 44 U/L   Alkaline Phosphatase 63 38 - 126 U/L   Total Bilirubin 0.5 0.3 - 1.2 mg/dL   GFR, Estimated 19 (L) >60 mL/min   Anion gap 16 (H) 5 - 15   US Abdomen Limited RUQ (LIVER/GB)  Result Date: 12/12/2022 CLINICAL DATA:  Cholelithiasis. EXAM: ULTRASOUND ABDOMEN LIMITED RIGHT UPPER QUADRANT COMPARISON:  Same day CT abdomen pelvis. FINDINGS: Gallbladder: Gallstones are noted, measuring up to 2.7 cm in diameter. No gallbladder wall thickening visualized. A positive sonographic Eulah Pont sign was noted by sonographer. Common bile duct: Diameter: 4 mm Liver: No focal lesion identified. Within normal limits in parenchymal echogenicity. Portal vein is patent on color Doppler imaging with normal direction of blood flow towards the liver. Other: A 2.0 cm cyst is seen in the upper pole the right kidney. IMPRESSION: Cholelithiasis with a positive sonographic Murphy sign, but no gallbladder wall thickening or pericholecystic fluid. Findings are suggestive of acute cholecystitis.  Electronically Signed   By: Romona Curls M.D.   On: 12/12/2022 19:15   CT ABDOMEN PELVIS WO CONTRAST  Result Date: 12/12/2022 CLINICAL DATA:  Nonlocalized abdominal pain EXAM: CT ABDOMEN AND PELVIS WITHOUT CONTRAST TECHNIQUE: Multidetector CT imaging of the abdomen and pelvis was performed following the standard protocol without IV contrast. RADIATION DOSE REDUCTION: This exam was performed according to the departmental dose-optimization program which includes automated exposure control, adjustment of the mA and/or kV according to patient size and/or use of iterative reconstruction technique. COMPARISON:  None Available. FINDINGS: Lower chest: Heart is slightly enlarged. Large-bore catheter seen along the right side of the heart. Breathing motion at the lung bases. Mild basilar scar or atelectasis. Hepatobiliary: Dilated gallbladder with stones including a stone which may be impacted in the neck of the gallbladder.  Please correlate for other signs of acute cholecystitis recommend further evaluation such as ultrasound or HIDA scan. Liver is otherwise grossly preserved on this non IV contrast exam. Pancreas: Unremarkable. No pancreatic ductal dilatation or surrounding inflammatory changes. Spleen: Normal in size without focal abnormality. Adrenals/Urinary Tract: The adrenal glands are preserved. Mild bilateral renal atrophy. No abnormal calcification is seen within either kidney nor along the course of either ureter. Preserved contours of the urinary bladder. There is a cystic lesion along the upper pole of the right kidney measuring Hounsfield unit of less than 0 and diameter of 2.4 cm. Smaller focus more lateral as well in the right kidney measuring also Hounsfield unit of less than 0 and diameter approaching 1.5 cm. Likely both benign cysts. Specific imaging follow-up. Stomach/Bowel: Large bowel has a normal course and caliber with scattered stool. Few left-sided colonic diverticula. Normal retrocecal appendix. Small bowel is nondilated. G-tube in the stomach. Vascular/Lymphatic: IVC filter in place. Normal caliber aorta with slight atherosclerotic calcifications along the aorta and branch vessels. No specific abnormal lymph node enlargement seen in the abdomen and pelvis. Reproductive: Status post hysterectomy. No adnexal masses. Other: No free air or free fluid. Musculoskeletal: Scattered degenerative changes of the spine and pelvis. Trace retrolisthesis at T11 on T12 and anterolisthesis of L3 on L4. Degenerative changes of the pelvis. IMPRESSION: Dilated gallbladder with stones including a stone which may be impacted in the neck of the gallbladder. Acute cholecystitis is possible. Recommend further workup such as HIDA scan or ultrasound. Scattered colonic diverticula. Normal appendix. No bowel obstruction. IVC filter. Mild bilateral renal atrophy. No obstructing stones. Benign-appearing Bosniak 1 renal cysts. Enlarged  heart with a large-bore catheter. Electronically Signed   By: Karen Kays M.D.   On: 12/12/2022 16:17   DG Chest 2 View  Result Date: 12/12/2022 CLINICAL DATA:  Shortness of breath. EXAM: CHEST - 2 VIEW COMPARISON:  None Available. FINDINGS: Heart is enlarged. A right subclavian dialysis catheter is in place. The tip turns medially and may be near the mitral valve. No edema or effusion is present. No focal airspace disease is present. The lung volumes are low. IVC filter is noted. Degenerative changes are present in thoracic spine. Surgical clips are present in the left axilla. IMPRESSION: 1. Cardiomegaly without failure. 2. No acute cardiopulmonary disease. 3. Right subclavian dialysis catheter is in place. Electronically Signed   By: Marin Roberts M.D.   On: 12/12/2022 12:18    Physical Exam  BP (!) 135/53 (BP Location: Left Arm)   Pulse 90   Temp 99 F (37.2 C) (Oral)   Resp 20   Ht 4\' 11"  (1.499 m)   Wt 63.5  kg   SpO2 99%   BMI 28.28 kg/m   Physical Exam  Procedures  Procedures  ED Course / MDM   Clinical Course as of 12/12/22 2019  Sat Dec 12, 2022  1847 Attempted L bicep [place] IV but difficult w/ contracture of LUE. Cannot use RUE given dialysis access. Multiple failed attempts by MD, RNs, and IV team. D/w daughters possibility of central line, who state that patient has had to have IV access in groin before.  [HN]  1945 IV obtained 22g in L arm by RN [HN]    Clinical Course User Index [HN] Loetta Rough, MD   Medical Decision Making Amount and/or Complexity of Data Reviewed Labs: ordered. Radiology: ordered.  Risk Decision regarding hospitalization.   This patient presents to the ED with chief complaint(s) of generalized abdominal pain with pertinent past medical history of ESRD on dialysis, PEG tube, CVA, dementia, diabetes, hypertension, heart failure.  The complaint involves an extensive differential diagnosis and also carries with it a high risk of  complications and morbidity.    The differential diagnosis includes acute cholecystitis, appendicitis, gastritis, gastroenteritis, pancreatitis, peritonitis, SBO/LBO, biliary disease, PUD.  This list is not exhaustive.     The plan is to obtain IV access, abdominal pain labs, and CT of abdomen/pelvis  Additional history obtained: Additional history obtained from family, daughter at bedside who state that patient has had similar abdominal pain in the past.  Patient has not had her abdominal pain evaluated since it began 1 week ago.    Assessment:   Patient is resting comfortably in bed with family at bedside.  She is not in acute distress.  She has no tenderness to palpation of the abdomen.    Independent ECG/labs interpretation:  The following labs were independently interpreted:  CBC with anemia.  No leukocytosis.  Metabolic panel with elevated creatinine, patient is ESRD on hemodialysis.  No major electrolyte disturbances.  Mildly elevated anion gap.  Magnesium normal.   Independent visualization and interpretation of imaging: I independently visualized the following imaging with scope of interpretation limited to determining acute life threatening conditions related to emergency care: CT abdomen/pelvis, which revealed gallstones and findings suggestive of acute cholecystitis.  I agree with radiologist interpretation.    RUQ [place] was ordered for further evaluation.  Patient with positive Murphy sign on [place] and evidence of acute cholecystitis.    Treatment and Reassessment: Patient has been comfortable and not currently complaining of pain at time of reassessment.  Patient is hungry and requesting food.  Will allow patient to eat and drink.   Consultations Obtained:   I requested consultation with on-call general surgeon and spoke with Kris Mouton.  General surgery will consult for acute cholecystitis with plan to remove gallbladder next week.  Advised to hold Eliquis.   Patient is  hemodialysis M/W/F.  Requested consultation with on-call nephrologist and spoke with Delano Metz who will arrange for patient to receive dialysis while admitted.   I requested consultation with on-call hospitalist provider and spoke with Newton Pigg who agreed with hospital admission.    Disposition:   Patient to be admitted to [facility] for acute cholecystitis.          Melton Alar R, PA-C 12/12/22 2148    Loetta Rough, MD 12/15/22 [phone]

## 2022-12-12 NOTE — Consult Note (Addendum)
Reason for Consult/Chief Complaint: acute cholecystitis Consultant: Chestine Spore, PA  Samantha [name] is an 77 y.o. female.   HPI: 11F with LUQ abdominal pain that began last week, relapsing and remitting since then. Denies nausea or vomiting. Last BM 4/19, described as normal. Reports prior colonoscopy, unsure. ESRD on MWF-HD, last full session 4/19. On a DOAC for "blood clot at her port (permcath)" in Feb 2024, last dose 4/19 PM, BID dosing. PEG placed in 2021, currently using.   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   Dementia    Diabetes mellitus without complication    ESRD (end stage renal disease) on dialysis    Heart failure    HTN (hypertension)    PEG (percutaneous endoscopic gastrostomy) status    Renal disorder    Thyroid disease     Past Surgical History:  Procedure Laterality Date   ABDOMINAL HYSTERECTOMY      History reviewed. No pertinent family history.  Social History:  reports that she has never smoked. She does not have any smokeless tobacco history on file. She reports that she does not currently use alcohol. She reports that she does not use drugs.  Allergies: No Known Allergies  Medications: I have reviewed the patient's current medications.  Results for orders placed or performed during the hospital encounter of 12/12/22 (from the past 48 hour(s))  Magnesium     Status: None   Collection Time: 12/12/22  7:17 PM  Result Value Ref Range   Magnesium 2.4 1.7 - 2.4 mg/dL    Comment: Performed at [facility], [address], [place], [place] [address]  CBC with Differential     Status: Abnormal   Collection Time: 12/12/22  7:17 PM  Result Value Ref Range   WBC 10.1 4.0 - 10.5 K/uL   RBC 3.78 (L) 3.87 - 5.11 MIL/uL   Hemoglobin 11.1 (L) 12.0 - 15.0 g/dL   HCT 19.1 (L) 47.8 - 29.5 %   MCV 92.9 80.0 - 100.0 fL   MCH 29.4 26.0 - 34.0 pg   MCHC 31.6 30.0 - 36.0 g/dL   RDW 62.1 (H)  30.8 - 15.5 %   Platelets 176 150 - 400 K/uL   nRBC 0.2 0.0 - 0.2 %   Neutrophils Relative % 61 %   Neutro Abs 6.1 1.7 - 7.7 K/uL   Lymphocytes Relative 28 %   Lymphs Abs 2.8 0.7 - 4.0 K/uL   Monocytes Relative 8 %   Monocytes Absolute 0.9 0.1 - 1.0 K/uL   Eosinophils Relative 2 %   Eosinophils Absolute 0.2 0.0 - 0.5 K/uL   Basophils Relative 0 %   Basophils Absolute 0.0 0.0 - 0.1 K/uL   Immature Granulocytes 1 %   Abs Immature Granulocytes 0.13 (H) 0.00 - 0.07 K/uL    Comment: Performed at [facility], [address], [place], [place] [address]  Comprehensive metabolic panel     Status: Abnormal   Collection Time: 12/12/22  7:17 PM  Result Value Ref Range   Sodium 134 (L) 135 - 145 mmol/L   Potassium 3.6 3.5 - 5.1 mmol/L   Chloride 95 (L) 98 - 111 mmol/L   CO2 23 22 - 32 mmol/L   Glucose, Bld 166 (H) 70 - 99 mg/dL    Comment: Glucose reference range applies only to samples taken after fasting for at least 8 hours.   BUN 39 (  H) 8 - 23 mg/dL   Creatinine, Ser 1.61 (H) 0.44 - 1.00 mg/dL   Calcium 9.7 8.9 - 09.6 mg/dL   Total Protein 8.1 6.5 - 8.1 g/dL   Albumin 4.2 3.5 - 5.0 g/dL   AST 27 15 - 41 U/L   ALT 26 0 - 44 U/L   Alkaline Phosphatase 63 38 - 126 U/L   Total Bilirubin 0.5 0.3 - 1.2 mg/dL   GFR, Estimated 19 (L) >60 mL/min    Comment: (NOTE) Calculated using the CKD-EPI Creatinine Equation (2021)    Anion gap 16 (H) 5 - 15    Comment: Performed at [facility], [address] [place], [place] [address]    US Abdomen Limited RUQ (LIVER/GB)  Result Date: 12/12/2022 CLINICAL DATA:  Cholelithiasis. EXAM: ULTRASOUND ABDOMEN LIMITED RIGHT UPPER QUADRANT COMPARISON:  Same day CT abdomen pelvis. FINDINGS: Gallbladder: Gallstones are noted, measuring up to 2.7 cm in diameter. No gallbladder wall thickening visualized. A positive sonographic Eulah Pont sign was noted by sonographer. Common bile duct: Diameter: 4 mm Liver: No focal lesion identified. Within normal  limits in parenchymal echogenicity. Portal vein is patent on color Doppler imaging with normal direction of blood flow towards the liver. Other: A 2.0 cm cyst is seen in the upper pole the right kidney. IMPRESSION: Cholelithiasis with a positive sonographic Murphy sign, but no gallbladder wall thickening or pericholecystic fluid. Findings are suggestive of acute cholecystitis. Electronically Signed   By: Romona Curls M.D.   On: 12/12/2022 19:15   CT ABDOMEN PELVIS WO CONTRAST  Result Date: 12/12/2022 CLINICAL DATA:  Nonlocalized abdominal pain EXAM: CT ABDOMEN AND PELVIS WITHOUT CONTRAST TECHNIQUE: Multidetector CT imaging of the abdomen and pelvis was performed following the standard protocol without IV contrast. RADIATION DOSE REDUCTION: This exam was performed according to the departmental dose-optimization program which includes automated exposure control, adjustment of the mA and/or kV according to patient size and/or use of iterative reconstruction technique. COMPARISON:  None Available. FINDINGS: Lower chest: Heart is slightly enlarged. Large-bore catheter seen along the right side of the heart. Breathing motion at the lung bases. Mild basilar scar or atelectasis. Hepatobiliary: Dilated gallbladder with stones including a stone which may be impacted in the neck of the gallbladder. Please correlate for other signs of acute cholecystitis recommend further evaluation such as ultrasound or HIDA scan. Liver is otherwise grossly preserved on this non IV contrast exam. Pancreas: Unremarkable. No pancreatic ductal dilatation or surrounding inflammatory changes. Spleen: Normal in size without focal abnormality. Adrenals/Urinary Tract: The adrenal glands are preserved. Mild bilateral renal atrophy. No abnormal calcification is seen within either kidney nor along the course of either ureter. Preserved contours of the urinary bladder. There is a cystic lesion along the upper pole of the right kidney measuring  Hounsfield unit of less than 0 and diameter of 2.4 cm. Smaller focus more lateral as well in the right kidney measuring also Hounsfield unit of less than 0 and diameter approaching 1.5 cm. Likely both benign cysts. Specific imaging follow-up. Stomach/Bowel: Large bowel has a normal course and caliber with scattered stool. Few left-sided colonic diverticula. Normal retrocecal appendix. Small bowel is nondilated. G-tube in the stomach. Vascular/Lymphatic: IVC filter in place. Normal caliber aorta with slight atherosclerotic calcifications along the aorta and branch vessels. No specific abnormal lymph node enlargement seen in the abdomen and pelvis. Reproductive: Status post hysterectomy. No adnexal masses. Other: No free air or free fluid. Musculoskeletal: Scattered degenerative changes of the spine and  pelvis. Trace retrolisthesis at T11 on T12 and anterolisthesis of L3 on L4. Degenerative changes of the pelvis. IMPRESSION: Dilated gallbladder with stones including a stone which may be impacted in the neck of the gallbladder. Acute cholecystitis is possible. Recommend further workup such as HIDA scan or ultrasound. Scattered colonic diverticula. Normal appendix. No bowel obstruction. IVC filter. Mild bilateral renal atrophy. No obstructing stones. Benign-appearing Bosniak 1 renal cysts. Enlarged heart with a large-bore catheter. Electronically Signed   By: Karen Kays M.D.   On: 12/12/2022 16:17   DG Chest 2 View  Result Date: 12/12/2022 CLINICAL DATA:  Shortness of breath. EXAM: CHEST - 2 VIEW COMPARISON:  None Available. FINDINGS: Heart is enlarged. A right subclavian dialysis catheter is in place. The tip turns medially and may be near the mitral valve. No edema or effusion is present. No focal airspace disease is present. The lung volumes are low. IVC filter is noted. Degenerative changes are present in thoracic spine. Surgical clips are present in the left axilla. IMPRESSION: 1. Cardiomegaly without  failure. 2. No acute cardiopulmonary disease. 3. Right subclavian dialysis catheter is in place. Electronically Signed   By: Marin Roberts M.D.   On: 12/12/2022 12:18    ROS 10 point review of systems is negative except as listed above in HPI.   Physical Exam Blood pressure 136/81, pulse 78, temperature 99 F (37.2 C), temperature source Oral, resp. rate 16, height  (1.499 m), weight 63.5 kg, SpO2 98 %. Constitutional: well-developed, well-nourished HEENT: pupils equal, round, reactive to light, 2mm b/l, moist conjunctiva, external inspection of ears and nose normal, hearing intact Oropharynx: normal oropharyngeal mucosa, poor dentition Neck: no thyromegaly, trachea midline, no midline cervical tenderness to palpation Chest: breath sounds equal bilaterally, normal respiratory effort, no midline or lateral chest wall tenderness to palpation/deformity, permcath R chest Abdomen: soft, obese, PEG in place, BUQ TTP, no bruising, no hepatosplenomegaly GU: normal female genitalia  Back: no wounds, no thoracic/lumbar spine tenderness to palpation, no thoracic/lumbar spine stepoffs Rectal: deferred Extremities: 2+ radial and pedal pulses bilaterally MSK: unable to assess gait/station Skin: warm, dry, no rashes Psych: normal memory, normal mood/affect     Assessment/Plan: 45F with equivocal [place] for cholecystitis. Recommend HIDA for further w/u. Continue to hold DOAC, okay for heparin drip if indicated. Okay for diet as tolerated as long as no interference with timing of HIDA.    Diamantina Monks, MD General and Trauma Surgery [facility]

## 2022-12-12 NOTE — H&P (Signed)
History and Physical      Samantha [name] WJX:[id number] DOB: May 07, 1946 DOA: 12/12/2022  PCP: Patient, No Pcp Per *** Patient coming from: home ***  I have personally briefly reviewed patient's old medical records in [facility]  Chief Complaint: ***  HPI: Samantha [name] is a 77 y.o. female with medical history significant for *** who is admitted to [facility] on 12/12/2022 with *** after presenting from home*** to [facility] ED complaining of ***.    ***       ***   ED Course:  Vital signs in the ED were notable for the following: ***  Labs were notable for the following: ***  Per my interpretation, EKG in ED demonstrated the following:  ***  Imaging and additional notable ED work-up: ***  While in the ED, the following were administered: ***  Subsequently, the patient was admitted  ***  ***red    Review of Systems: As per HPI otherwise 10 point review of systems negative.   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   Dementia    Diabetes mellitus without complication    ESRD (end stage renal disease) on dialysis    Heart failure    HTN (hypertension)    PEG (percutaneous endoscopic gastrostomy) status    Renal disorder    Thyroid disease     Past Surgical History:  Procedure Laterality Date   ABDOMINAL HYSTERECTOMY      Social History:  reports that she has never smoked. She does not have any smokeless tobacco history on file. No history on file for alcohol use and drug use.   No Known Allergies  History reviewed. No pertinent family history.  Family history reviewed and not pertinent ***   Prior to Admission medications   Medication Sig Start Date End Date Taking? Authorizing Provider  amLODipine (NORVASC) 10 MG tablet Take 10-15.5 mg by mouth 2 (two) times daily. One in the morning and 1 and half tablets in the evening    [provider]  bisacodyl  (DULCOLAX) 5 MG EC tablet Take 5 mg by mouth daily as needed for moderate constipation.    [provider]  carvedilol (COREG) 25 MG tablet Take 25 mg by mouth 2 (two) times daily with a meal.    [provider]  cholecalciferol (VITAMIN D) 1000 UNITS tablet Take 1,000 Units by mouth daily.    [provider]  Cod Liver Oil 1000 MG CAPS Take 1 capsule by mouth daily.    [provider]  insulin glargine (LANTUS) 100 UNIT/ML injection Inject 25 Units into the skin at bedtime.    [provider]  insulin lispro (HUMALOG) 100 UNIT/ML injection Inject 19 Units into the skin 2 (two) times daily. 12 units before lunch and 7 units before supper    [provider]  losartan-hydrochlorothiazide (HYZAAR) 100-25 MG per tablet Take 1 tablet by mouth daily.    [provider]  traMADol (ULTRAM) 50 MG tablet Take 1 tablet (50 mg total) by mouth every 6 (six) hours as needed. 11/08/13   Junious Silk, PA-C     Objective    Physical Exam: Vitals:   12/12/22 1936 12/12/22 2000 12/12/22 2030 12/12/22 2200  BP: 134/63 139/73 121/80 126/65  Pulse: 76 78 76 77  Resp: 20 (!) Temp:      TempSrc:      SpO2: 100%  99% 99% 98%  Weight:      Height:        General: appears to be stated age; alert, oriented Skin: warm, dry, no rash Head:  AT/[place] Mouth:  Oral mucosa membranes appear moist, normal dentition Neck: supple; trachea midline Heart:  RRR; did not appreciate any M/R/G Lungs: CTAB, did not appreciate any wheezes, rales, or rhonchi Abdomen: + BS; soft, ND, NT Vascular: 2+ pedal pulses b/l; 2+ radial pulses b/l Extremities: no peripheral edema, no muscle wasting Neuro: strength and sensation intact in upper and lower extremities b/l ***   *** Neuro: 5/5 strength of the proximal and distal flexors and extensors of the upper and lower extremities bilaterally; sensation intact in upper and lower extremities b/l; cranial nerves II  through XII grossly intact; no pronator drift; no evidence suggestive of slurred speech, dysarthria, or facial droop; Normal muscle tone. No tremors.  *** Neuro: In the setting of the patient's current mental status and associated inability to follow instructions, unable to perform full neurologic exam at this time.  As such, assessment of strength, sensation, and cranial nerves is limited at this time. Patient noted to spontaneously move all 4 extremities. No tremors.  ***    Labs on Admission: I have personally reviewed following labs and imaging studies  CBC: Recent Labs  Lab 12/12/22 1917  WBC 10.1  NEUTROABS 6.1  HGB 11.1*  HCT 35.1*  MCV 92.9  PLT 176   Basic Metabolic Panel: Recent Labs  Lab 12/12/22 1917  NA 134*  K 3.6  CL 95*  CO2 23  GLUCOSE 166*  BUN 39*  CREATININE 2.60*  CALCIUM 9.7  MG 2.4   GFR: Estimated Creatinine Clearance: 14.9 mL/min (A) (by C-G formula based on SCr of 2.6 mg/dL (H)). Liver Function Tests: Recent Labs  Lab 12/12/22 1917  AST 27  ALT 26  ALKPHOS 63  BILITOT 0.5  PROT 8.1  ALBUMIN 4.2   No results for input(s): "LIPASE", "AMYLASE" in the last 168 hours. No results for input(s): "AMMONIA" in the last 168 hours. Coagulation Profile: No results for input(s): "INR", "PROTIME" in the last 168 hours. Cardiac Enzymes: No results for input(s): "CKTOTAL", "CKMB", "CKMBINDEX", "TROPONINI" in the last 168 hours. BNP (last 3 results) No results for input(s): "PROBNP" in the last 8760 hours. HbA1C: No results for input(s): "HGBA1C" in the last 72 hours. CBG: No results for input(s): "GLUCAP" in the last 168 hours. Lipid Profile: No results for input(s): "CHOL", "HDL", "LDLCALC", "TRIG", "CHOLHDL", "LDLDIRECT" in the last 72 hours. Thyroid Function Tests: No results for input(s): "TSH", "T4TOTAL", "FREET4", "T3FREE", "THYROIDAB" in the last 72 hours. Anemia Panel: No results for input(s): "VITAMINB12", "FOLATE", "FERRITIN",  "TIBC", "IRON", "RETICCTPCT" in the last 72 hours. Urine analysis: No results found for: "COLORURINE", "APPEARANCEUR", "LABSPEC", "PHURINE", "GLUCOSEU", "HGBUR", "BILIRUBINUR", "KETONESUR", "PROTEINUR", "UROBILINOGEN", "NITRITE", "LEUKOCYTESUR"  Radiological Exams on Admission: US Abdomen Limited RUQ (LIVER/GB)  Result Date: 12/12/2022 CLINICAL DATA:  Cholelithiasis. EXAM: ULTRASOUND ABDOMEN LIMITED RIGHT UPPER QUADRANT COMPARISON:  Same day CT abdomen pelvis. FINDINGS: Gallbladder: Gallstones are noted, measuring up to 2.7 cm in diameter. No gallbladder wall thickening visualized. A positive sonographic Eulah Pont sign was noted by sonographer. Common bile duct: Diameter: 4 mm Liver: No focal lesion identified. Within normal limits in parenchymal echogenicity. Portal vein is patent on color Doppler imaging with normal direction of blood flow towards the liver. Other: A 2.0 cm cyst is seen in the upper pole the right kidney. IMPRESSION: Cholelithiasis with a  positive sonographic Murphy sign, but no gallbladder wall thickening or pericholecystic fluid. Findings are suggestive of acute cholecystitis. Electronically Signed   By: Romona Curls M.D.   On: 12/12/2022 19:15   CT ABDOMEN PELVIS WO CONTRAST  Result Date: 12/12/2022 CLINICAL DATA:  Nonlocalized abdominal pain EXAM: CT ABDOMEN AND PELVIS WITHOUT CONTRAST TECHNIQUE: Multidetector CT imaging of the abdomen and pelvis was performed following the standard protocol without IV contrast. RADIATION DOSE REDUCTION: This exam was performed according to the departmental dose-optimization program which includes automated exposure control, adjustment of the mA and/or kV according to patient size and/or use of iterative reconstruction technique. COMPARISON:  None Available. FINDINGS: Lower chest: Heart is slightly enlarged. Large-bore catheter seen along the right side of the heart. Breathing motion at the lung bases. Mild basilar scar or atelectasis. Hepatobiliary:  Dilated gallbladder with stones including a stone which may be impacted in the neck of the gallbladder. Please correlate for other signs of acute cholecystitis recommend further evaluation such as ultrasound or HIDA scan. Liver is otherwise grossly preserved on this non IV contrast exam. Pancreas: Unremarkable. No pancreatic ductal dilatation or surrounding inflammatory changes. Spleen: Normal in size without focal abnormality. Adrenals/Urinary Tract: The adrenal glands are preserved. Mild bilateral renal atrophy. No abnormal calcification is seen within either kidney nor along the course of either ureter. Preserved contours of the urinary bladder. There is a cystic lesion along the upper pole of the right kidney measuring Hounsfield unit of less than 0 and diameter of 2.4 cm. Smaller focus more lateral as well in the right kidney measuring also Hounsfield unit of less than 0 and diameter approaching 1.5 cm. Likely both benign cysts. Specific imaging follow-up. Stomach/Bowel: Large bowel has a normal course and caliber with scattered stool. Few left-sided colonic diverticula. Normal retrocecal appendix. Small bowel is nondilated. G-tube in the stomach. Vascular/Lymphatic: IVC filter in place. Normal caliber aorta with slight atherosclerotic calcifications along the aorta and branch vessels. No specific abnormal lymph node enlargement seen in the abdomen and pelvis. Reproductive: Status post hysterectomy. No adnexal masses. Other: No free air or free fluid. Musculoskeletal: Scattered degenerative changes of the spine and pelvis. Trace retrolisthesis at T11 on T12 and anterolisthesis of L3 on L4. Degenerative changes of the pelvis. IMPRESSION: Dilated gallbladder with stones including a stone which may be impacted in the neck of the gallbladder. Acute cholecystitis is possible. Recommend further workup such as HIDA scan or ultrasound. Scattered colonic diverticula. Normal appendix. No bowel obstruction. IVC filter.  Mild bilateral renal atrophy. No obstructing stones. Benign-appearing Bosniak 1 renal cysts. Enlarged heart with a large-bore catheter. Electronically Signed   By: Karen Kays M.D.   On: 12/12/2022 16:17   DG Chest 2 View  Result Date: 12/12/2022 CLINICAL DATA:  Shortness of breath. EXAM: CHEST - 2 VIEW COMPARISON:  None Available. FINDINGS: Heart is enlarged. A right subclavian dialysis catheter is in place. The tip turns medially and may be near the mitral valve. No edema or effusion is present. No focal airspace disease is present. The lung volumes are low. IVC filter is noted. Degenerative changes are present in thoracic spine. Surgical clips are present in the left axilla. IMPRESSION: 1. Cardiomegaly without failure. 2. No acute cardiopulmonary disease. 3. Right subclavian dialysis catheter is in place. Electronically Signed   By: Marin Roberts M.D.   On: 12/12/2022 12:18      Assessment/Plan   Principal Problem:   Acute cholecystitis Active Problems:   CVA (cerebral vascular accident)   ***       ***            ***             ***            ***            ***            ***             ***           ***           ***           ***           ***          ***          ***  DVT prophylaxis: SCD's ***  Code Status: Full code*** Family Communication: none*** Disposition Plan: Per Rounding Team Consults called: none***;  Admission status: ***     I SPENT GREATER THAN 75 *** MINUTES IN CLINICAL CARE TIME/MEDICAL DECISION-MAKING IN COMPLETING THIS ADMISSION.      Chaney Born Crystalynn Mcinerney DO [facility]  From 7PM - 7AM   12/12/2022, 10:09 PM   ***

## 2022-12-12 NOTE — ED Notes (Signed)
IV team unable to get blood or working line ED phlebotomist unable to get blood with butterfly No labs available at this time

## 2022-12-13 DIAGNOSIS — N186 End stage renal disease: Secondary | ICD-10-CM | POA: Diagnosis not present

## 2022-12-13 DIAGNOSIS — E119 Type 2 diabetes mellitus without complications: Secondary | ICD-10-CM

## 2022-12-13 DIAGNOSIS — Z86718 Personal history of other venous thrombosis and embolism: Secondary | ICD-10-CM

## 2022-12-13 DIAGNOSIS — R627 Adult failure to thrive: Secondary | ICD-10-CM | POA: Diagnosis present

## 2022-12-13 DIAGNOSIS — I10 Essential (primary) hypertension: Secondary | ICD-10-CM | POA: Diagnosis not present

## 2022-12-13 DIAGNOSIS — R1013 Epigastric pain: Secondary | ICD-10-CM | POA: Diagnosis present

## 2022-12-13 DIAGNOSIS — Z992 Dependence on renal dialysis: Secondary | ICD-10-CM

## 2022-12-13 LAB — URINALYSIS, ROUTINE W REFLEX MICROSCOPIC
Bilirubin Urine: NEGATIVE
Glucose, UA: 150 mg/dL — AB
Ketones, ur: NEGATIVE mg/dL
Leukocytes,Ua: NEGATIVE
Nitrite: NEGATIVE
Protein, ur: 100 mg/dL — AB
Specific Gravity, Urine: 1.011 (ref 1.005–1.030)
pH: 5 (ref 5.0–8.0)

## 2022-12-13 LAB — DIFFERENTIAL
Abs Immature Granulocytes: 0.09 10*3/uL — ABNORMAL HIGH (ref 0.00–0.07)
Basophils Absolute: 0.1 10*3/uL (ref 0.0–0.1)
Basophils Relative: 1 %
Eosinophils Absolute: 0.2 10*3/uL (ref 0.0–0.5)
Eosinophils Relative: 2 %
Immature Granulocytes: 1 %
Lymphocytes Relative: 29 %
Lymphs Abs: 2.6 10*3/uL (ref 0.7–4.0)
Monocytes Absolute: 0.8 10*3/uL (ref 0.1–1.0)
Monocytes Relative: 9 %
Neutro Abs: 5.2 10*3/uL (ref 1.7–7.7)
Neutrophils Relative %: 58 %

## 2022-12-13 LAB — APTT
aPTT: 118 seconds — ABNORMAL HIGH (ref 24–36)
aPTT: 60 seconds — ABNORMAL HIGH (ref 24–36)
aPTT: 141 seconds — ABNORMAL HIGH (ref 24–36)

## 2022-12-13 LAB — HEMOGLOBIN A1C
Hgb A1c MFr Bld: 7.3 % — ABNORMAL HIGH (ref 4.8–5.6)
Mean Plasma Glucose: 162.81 mg/dL

## 2022-12-13 LAB — HEPARIN LEVEL (UNFRACTIONATED)
Heparin Unfractionated: >1.1 IU/mL — ABNORMAL HIGH (ref 0.30–0.70)
Heparin Unfractionated: >1.1 IU/mL — ABNORMAL HIGH (ref 0.30–0.70)

## 2022-12-13 LAB — COMPREHENSIVE METABOLIC PANEL
ALT: 24 U/L (ref 0–44)
AST: 25 U/L (ref 15–41)
Albumin: 3.7 g/dL (ref 3.5–5.0)
Alkaline Phosphatase: 54 U/L (ref 38–126)
Anion gap: 12 (ref 5–15)
BUN: 43 mg/dL — ABNORMAL HIGH (ref 8–23)
CO2: 24 mmol/L (ref 22–32)
Calcium: 9.1 mg/dL (ref 8.9–10.3)
Chloride: 95 mmol/L — ABNORMAL LOW (ref 98–111)
Creatinine, Ser: 2.76 mg/dL — ABNORMAL HIGH (ref 0.44–1.00)
GFR, Estimated: 17 mL/min — ABNORMAL LOW (ref >60)
Glucose, Bld: 136 mg/dL — ABNORMAL HIGH (ref 70–99)
Potassium: 3.3 mmol/L — ABNORMAL LOW (ref 3.5–5.1)
Sodium: 131 mmol/L — ABNORMAL LOW (ref 135–145)
Total Bilirubin: 0.4 mg/dL (ref 0.3–1.2)
Total Protein: 7.1 g/dL (ref 6.5–8.1)

## 2022-12-13 LAB — CBC
HCT: 30.2 % — ABNORMAL LOW (ref 36.0–46.0)
Hemoglobin: 10.1 g/dL — ABNORMAL LOW (ref 12.0–15.0)
MCH: 30.2 pg (ref 26.0–34.0)
MCHC: 33.4 g/dL (ref 30.0–36.0)
MCV: 90.4 fL (ref 80.0–100.0)
Platelets: 147 10*3/uL — ABNORMAL LOW (ref 150–400)
RBC: 3.34 MIL/uL — ABNORMAL LOW (ref 3.87–5.11)
RDW: 18 % — ABNORMAL HIGH (ref 11.5–15.5)
WBC: 8.8 10*3/uL (ref 4.0–10.5)
nRBC: 0 % (ref 0.0–0.2)

## 2022-12-13 LAB — PROTIME-INR
INR: 1.5 — ABNORMAL HIGH (ref 0.8–1.2)
Prothrombin Time: 18.1 seconds — ABNORMAL HIGH (ref 11.4–15.2)

## 2022-12-13 LAB — CBG MONITORING, ED
Glucose-Capillary: 100 mg/dL — ABNORMAL HIGH (ref 70–99)
Glucose-Capillary: 130 mg/dL — ABNORMAL HIGH (ref 70–99)
Glucose-Capillary: 116 mg/dL — ABNORMAL HIGH (ref 70–99)

## 2022-12-13 LAB — MAGNESIUM: Magnesium: 2.2 mg/dL (ref 1.7–2.4)

## 2022-12-13 LAB — PHOSPHORUS: Phosphorus: 2.1 mg/dL — ABNORMAL LOW (ref 2.5–4.6)

## 2022-12-13 LAB — HEPATITIS B SURFACE ANTIGEN: Hepatitis B Surface Ag: NONREACTIVE

## 2022-12-13 LAB — LACTIC ACID, PLASMA: Lactic Acid, Venous: 1 mmol/L (ref 0.5–1.9)

## 2022-12-13 MED ORDER — CHLORHEXIDINE GLUCONATE CLOTH 2 % EX PADS
6.0000 | MEDICATED_PAD | Freq: Every day | CUTANEOUS | Status: DC
  Administered 2022-12-14 – 2022-12-24 (×10): 6 via TOPICAL

## 2022-12-13 MED ORDER — SODIUM CHLORIDE 0.9 % IV SOLN: INTRAVENOUS | Status: DC

## 2022-12-13 MED ORDER — METRONIDAZOLE 500 MG/100ML IV SOLN
500.0000 mg | Freq: Two times a day (BID) | INTRAVENOUS | Status: AC
  Administered 2022-12-13 – 2022-12-19 (×13): 500 mg via INTRAVENOUS
  Filled 2022-12-13 (×13): qty 100

## 2022-12-13 MED ORDER — HEPARIN (PORCINE) 25000 UT/250ML-% IV SOLN
850.0000 [IU]/h | INTRAVENOUS | Status: DC
  Administered 2022-12-13: 750 [IU]/h via INTRAVENOUS
  Filled 2022-12-13: qty 250

## 2022-12-13 MED ORDER — INSULIN GLARGINE-YFGN 100 UNIT/ML ~~LOC~~ SOLN
10.0000 [IU] | Freq: Every day | SUBCUTANEOUS | Status: DC
  Administered 2022-12-13 – 2022-12-17 (×5): 10 [IU] via SUBCUTANEOUS
  Filled 2022-12-13 (×6): qty 0.1

## 2022-12-13 MED ORDER — NEPRO/CARBSTEADY PO LIQD
1000.0000 mL | ORAL | Status: DC
  Administered 2022-12-13: 1000 mL
  Filled 2022-12-13: qty 1000

## 2022-12-13 MED ORDER — INSULIN ASPART 100 UNIT/ML IJ SOLN
0.0000 [IU] | Freq: Three times a day (TID) | INTRAMUSCULAR | Status: DC
  Administered 2022-12-14 – 2022-12-16 (×3): 1 [IU] via SUBCUTANEOUS
  Administered 2022-12-18: 5 [IU] via SUBCUTANEOUS

## 2022-12-13 MED ORDER — FREE WATER
50.0000 mL | Status: AC
  Administered 2022-12-13 – 2022-12-14 (×4): 50 mL

## 2022-12-13 MED ORDER — SODIUM CHLORIDE 0.9 % IV SOLN
2.0000 g | INTRAVENOUS | Status: AC
  Administered 2022-12-13 – 2022-12-19 (×6): 2 g via INTRAVENOUS
  Filled 2022-12-13 (×6): qty 20

## 2022-12-13 NOTE — Consult Note (Signed)
Renal Service Consult Note [facility]  Samantha [name] 12/13/2022 [name], MD Requesting Physician: Dr. Hanley Ben  Reason for Consult: ESRD pt with abdominal pain HPI: The patient is a 77 y.o. 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 Pain was epigastric/ RUQ, worse w/ attempting to eat. No f/c/s. No CP or SOB. Pt recently moved to [place] from [place]. In ED temp 99, HR 70s, SBP 130, RR 18, not hypoxic. Labs showed wbc 10.1, Hb 11, LA 1.0. CXR was negative. CT showed dilated GB w/ stones, no wall thickening of pericholecystic fluid. GB US showed +murphy's sign and again no wall edema or peri-fluid. Gen surg consulted and rec's HIDA scan. IV abx have been started. We are asked to see for ESRD.    Pt seen in ED room. No family available. Pt is very poor historian. She is alert and follows simple commands. She knows her dtr takes her to HD 3 x per week. She was they moved to [facility] from [place] recently. She says she had 3 HD sessions in [place] this past week. She doesn't know what her medical conditions are other than dialysis. She needs help at home. She is here mostly for stomach pains. She lives w/ her family. She denies any present SOB, orthopnea.   In ED CXR negative.   ROS - denies CP, no joint pain, no HA, no blurry vision, no rash, no diarrhea, no nausea/ vomiting, no dysuria, no difficulty voiding   Past Medical History  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   Dementia    Diabetes mellitus without complication    ESRD (end stage renal disease) on dialysis    Heart failure    HTN (hypertension)    PEG (percutaneous endoscopic gastrostomy) status    Renal disorder    Thyroid disease    Past Surgical History  Past Surgical History:  Procedure Laterality Date   ABDOMINAL HYSTERECTOMY     Family  History History reviewed. No pertinent family history. Social History  reports that she has never smoked. She does not have any smokeless tobacco history on file. She reports that she does not currently use alcohol. She reports that she does not use drugs. Allergies No Known Allergies Home medications Prior to Admission medications   Medication Sig Start Date End Date Taking? Authorizing Provider  amLODipine (NORVASC) 10 MG tablet Take 10 mg by mouth daily.   Yes [provider]  bisacodyl (DULCOLAX) 5 MG EC tablet Take 5 mg by mouth daily as needed for moderate constipation.   Yes [provider]  carvedilol (COREG) 25 MG tablet Take 25 mg by mouth 2 (two) times daily with a meal.   Yes [provider]  cholecalciferol (VITAMIN D) 1000 UNITS tablet Take 1,000 Units by mouth daily.   Yes [provider]  Cod Liver Oil 1000 MG CAPS Take 1 capsule by mouth daily.   Yes [provider]  insulin glargine (LANTUS) 100 UNIT/ML injection Inject 25 Units into the skin at bedtime.   Yes [provider]  insulin lispro (HUMALOG) 100 UNIT/ML injection Inject 19 Units into the skin 2 (two) times daily. 12 units before lunch and 7 units before supper   Yes [provider]  losartan-hydrochlorothiazide Mauri Reading)  100-25 MG per tablet Take 1 tablet by mouth daily.   Yes [provider]  traMADol (ULTRAM) 50 MG tablet Take 1 tablet (50 mg total) by mouth every 6 (six) hours as needed. 11/08/13  Yes Junious Silk, PA-C     Vitals:   12/13/22 0815 12/13/22 1145 12/13/22 1253 12/13/22 1300  BP:  128/69  (!) 138/42  Pulse:  76  80  Resp:  15  13  Temp: 98.1 F (36.7 C)  98.2 F (36.8 C)   TempSrc: Oral  Oral   SpO2:  99%  99%  Weight:      Height:       Exam Gen alert, no distress, pleasant, on RA No rash, cyanosis or gangrene Sclera anicteric, throat clear  No jvd or bruits Chest clear bilat to bases, no rales/ wheezing RRR no  RG Abd soft obese ntnd no mass or ascites +bs, PEG tube L side GU defer MS no joint effusions or deformity Ext no LE or UE edema, no wounds or ulcers Neuro is alert , nf, moves all extremities    RIJ TDC in place    Home meds include - norvasc 10, coreg 25 bid, insulin glargine/ lispro, losartan- hctz 100-25 qd, tramadol, prns     OP HD: SW MWF 3h   350/ 600   64kg  3K/2.5Ca bath  TDC   Heparin 1500 then - hectorol ? dose - no other meds ordered yet - no recent HD sessions are recorded  Assessment/ Plan: Abdominal pain - w/ some abnormal findings on GB imaging. HIDA ordered. IV abx per pmd.  ESRD - on HD MWF. The patient reported moved from [place] to [place] recently and there are orders in our App for her to be dialyzing at SW unit, but there aren't any records of HD last week. Pt is a poor historian, and I was unable to reach family, but pmd talked w/ family this am who said her last HD was this past Friday. Plan next HD tomorrow.  HTN/ volume - BP's wnl, taking HTN meds at home, holding here w/ acute GI issues. No vol excess on exam, at dry wt. Keep even. Anemia esrd - Hb 10-11, no esa needs MBD ckd - CCa in range and phos is low. Hold binders for now.  S/p CVA - w/ left sided hemiparesis Nutrition - has PEG tube, per pmd. Renal diet.  DM - on insulin      Vinson Moselle  MD [facility] 12/13/2022, 3:20 PM  Recent Labs  Lab 12/12/22 1917 12/13/22 0146  HGB 11.1* 10.1*  ALBUMIN 4.2 3.7  CALCIUM 9.7 9.1  PHOS  --  2.1*  CREATININE 2.60* 2.76*  K 3.6 3.3*   Inpatient medications:  insulin aspart  0-6 Units Subcutaneous TID WC   insulin glargine-yfgn  10 Units Subcutaneous QHS    sodium chloride 75 mL/hr at 12/13/22 1244   cefTRIAXone (ROCEPHIN)  IV Stopped (12/13/22 1324)   heparin 850 Units/hr (12/13/22 1324)   metronidazole 500 mg (12/13/22 1253)   acetaminophen **OR** acetaminophen, fentaNYL (SUBLIMAZE) injection, melatonin, naLOXone (NARCAN)  injection,  ondansetron (ZOFRAN) IV

## 2022-12-13 NOTE — Progress Notes (Signed)
PROGRESS NOTE    Samantha [name]  QQV:[id number] DOB: Nov 28, 1945 DOA: 12/12/2022 PCP: Patient, No Pcp Per   Brief Narrative:  77 year old female with history of end-stage renal disease on hemodialysis, failure to thrive on PEG tube feeds, ischemic CVA with residual left-sided weakness, diabetes mellitus type 2, DVT on chronic anticoagulation with Eliquis presented with worsening abdominal pain.  On presentation, LFTs were normal, WBC of 2100, lactic acid of 1.  Chest x-ray showed no acute cardiopulmonary process.  CT of abdomen/pelvis showed dilated gallbladder with stones including a stone which may be impacted in the neck of the gallbladder; acute cholecystitis is possible.  Right upper quadrant ultrasound showed findings suggestive of acute cholecystitis.  General surgery was consulted  Assessment & Plan:   Abdominal pain with concern for acute calculus cholecystitis -Imaging as above.  General surgery following and not that was convinced that patient has acute cholecystitis.  HIDA scan Pending.  General surgery is okay for diet to be resumed. -Start Rocephin and Flagyl.  End-stage renal disease on hemodialysis -Nephrology has been consulted.  Follow-up recommendations.  Failure to thrive History of ischemic CVA with residual left-sided weakness Dysphagia requiring PEG tube feedings Physical deconditioning -Dietitian consulted to resume PEG tube feeding.  Patient still is able to eat by mouth but oral intake is poor. -Consult palliative care for goals of care discussion -Mostly bedbound.  Family is hopeful that patient can work with physical therapy during the hospitalization.  Will consult physical therapy to work with her from tomorrow onwards.  History of DVT with chronic anticoagulation -Eliquis on hold.  Currently on heparin drip.  Diabetes mellitus type 2  -Continue long-acting insulin along with CBGs with SSI  Hyponatremia -mild.  Managed by  hemodialysis.  Hypokalemia -Mild.  Monitor  Anemia of chronic disease -From chronic renal failure.  Hemoglobin currently stable.  Monitor intermittently  Thrombocytopenia -Mild.  Monitor intermittently.  No signs of bleeding  Essential hypertension -Blood pressure on the lower side.  Amlodipine, Coreg, losartan, hydrochlorothiazide on hold    DVT prophylaxis: Heparin drip Code Status: Full Family Communication: Daughter is at bedside Disposition Plan: Status is: Inpatient Remains inpatient appropriate because: Of severity of illness    Consultants: General surgery.  Consult palliative  care  Procedures: None  Antimicrobials: None currently   Subjective: Patient seen and examined at bedside.  Extremely slow to respond.  Denies any current abdominal pain.  No fever, vomiting, seizures or agitation reported.  Daughters present at bedside.  Objective: Vitals:   12/13/22 0615 12/13/22 0630 12/13/22 0645 12/13/22 0700  BP:  (!) 117/56  109/60  Pulse: 76 75 74 77  Resp: Temp:      TempSrc:      SpO2: 100% 99% 100% 100%  Weight:      Height:        Intake/Output Summary (Last 24 hours) at 12/13/2022 0743 Last data filed at 12/13/2022 0718 Gross per 24 hour  Intake 48.77 ml  Output --  Net 48.77 ml   Filed Weights   12/12/22 1134  Weight: 63.5 kg    Examination:  General exam: Appears calm and comfortable.  Looks chronically ill and deconditioned.  Currently on room air. Respiratory system: Bilateral decreased breath sounds at bases with scattered crackles Cardiovascular system: S1 & S2 heard, Rate controlled Gastrointestinal system: Abdomen is distended, soft and mildly tender in the upper quadrant.  Normal bowel sounds heard.  PEG tube present. Extremities: No cyanosis, clubbing,  edema  Central nervous system: Awake, slow to respond.  Poor historian.  Left sided weakness present.   Skin: No rashes, lesions or ulcers Psychiatry: Flat affect.   Not agitated.    Data Reviewed: I have personally reviewed following labs and imaging studies  CBC: Recent Labs  Lab 12/12/22 1917 12/13/22 0146  WBC 10.1 8.8  NEUTROABS 6.1 5.2  HGB 11.1* 10.1*  HCT 35.1* 30.2*  MCV 92.9 90.4  PLT 176 147*   Basic Metabolic Panel: Recent Labs  Lab 12/12/22 1917 12/13/22 0146  NA 134* 131*  K 3.6 3.3*  CL 95* 95*  CO2 23 24  GLUCOSE 166* 136*  BUN 39* 43*  CREATININE 2.60* 2.76*  CALCIUM 9.7 9.1  MG 2.4 2.2  PHOS  --  2.1*   GFR: Estimated Creatinine Clearance: 14 mL/min (A) (by C-G formula based on SCr of 2.76 mg/dL (H)). Liver Function Tests: Recent Labs  Lab 12/12/22 1917 12/13/22 0146  AST 27 25  ALT 26 24  ALKPHOS 63 54  BILITOT 0.5 0.4  PROT 8.1 7.1  ALBUMIN 4.2 3.7   No results for input(s): "LIPASE", "AMYLASE" in the last 168 hours. No results for input(s): "AMMONIA" in the last 168 hours. Coagulation Profile: Recent Labs  Lab 12/13/22 0146  INR 1.5*   Cardiac Enzymes: No results for input(s): "CKTOTAL", "CKMB", "CKMBINDEX", "TROPONINI" in the last 168 hours. BNP (last 3 results) No results for input(s): "PROBNP" in the last 8760 hours. HbA1C: No results for input(s): "HGBA1C" in the last 72 hours. CBG: Recent Labs  Lab 12/12/22 2338 12/13/22 0726  GLUCAP 134* 100*   Lipid Profile: No results for input(s): "CHOL", "HDL", "LDLCALC", "TRIG", "CHOLHDL", "LDLDIRECT" in the last 72 hours. Thyroid Function Tests: No results for input(s): "TSH", "T4TOTAL", "FREET4", "T3FREE", "THYROIDAB" in the last 72 hours. Anemia Panel: No results for input(s): "VITAMINB12", "FOLATE", "FERRITIN", "TIBC", "IRON", "RETICCTPCT" in the last 72 hours. Sepsis Labs: Recent Labs  Lab 12/13/22 0146  LATICACIDVEN 1.0    No results found for this or any previous visit (from the past 240 hour(s)).       Radiology Studies: US Abdomen Limited RUQ (LIVER/GB)  Result Date: 12/12/2022 CLINICAL DATA:  Cholelithiasis. EXAM:  ULTRASOUND ABDOMEN LIMITED RIGHT UPPER QUADRANT COMPARISON:  Same day CT abdomen pelvis. FINDINGS: Gallbladder: Gallstones are noted, measuring up to 2.7 cm in diameter. No gallbladder wall thickening visualized. A positive sonographic Eulah Pont sign was noted by sonographer. Common bile duct: Diameter: 4 mm Liver: No focal lesion identified. Within normal limits in parenchymal echogenicity. Portal vein is patent on color Doppler imaging with normal direction of blood flow towards the liver. Other: A 2.0 cm cyst is seen in the upper pole the right kidney. IMPRESSION: Cholelithiasis with a positive sonographic Murphy sign, but no gallbladder wall thickening or pericholecystic fluid. Findings are suggestive of acute cholecystitis. Electronically Signed   By: Romona Curls M.D.   On: 12/12/2022 19:15   CT ABDOMEN PELVIS WO CONTRAST  Result Date: 12/12/2022 CLINICAL DATA:  Nonlocalized abdominal pain EXAM: CT ABDOMEN AND PELVIS WITHOUT CONTRAST TECHNIQUE: Multidetector CT imaging of the abdomen and pelvis was performed following the standard protocol without IV contrast. RADIATION DOSE REDUCTION: This exam was performed according to the departmental dose-optimization program which includes automated exposure control, adjustment of the mA and/or kV according to patient size and/or use of iterative reconstruction technique. COMPARISON:  None Available. FINDINGS: Lower chest: Heart is slightly enlarged. Large-bore catheter seen along the right side of  the heart. Breathing motion at the lung bases. Mild basilar scar or atelectasis. Hepatobiliary: Dilated gallbladder with stones including a stone which may be impacted in the neck of the gallbladder. Please correlate for other signs of acute cholecystitis recommend further evaluation such as ultrasound or HIDA scan. Liver is otherwise grossly preserved on this non IV contrast exam. Pancreas: Unremarkable. No pancreatic ductal dilatation or surrounding inflammatory changes.  Spleen: Normal in size without focal abnormality. Adrenals/Urinary Tract: The adrenal glands are preserved. Mild bilateral renal atrophy. No abnormal calcification is seen within either kidney nor along the course of either ureter. Preserved contours of the urinary bladder. There is a cystic lesion along the upper pole of the right kidney measuring Hounsfield unit of less than 0 and diameter of 2.4 cm. Smaller focus more lateral as well in the right kidney measuring also Hounsfield unit of less than 0 and diameter approaching 1.5 cm. Likely both benign cysts. Specific imaging follow-up. Stomach/Bowel: Large bowel has a normal course and caliber with scattered stool. Few left-sided colonic diverticula. Normal retrocecal appendix. Small bowel is nondilated. G-tube in the stomach. Vascular/Lymphatic: IVC filter in place. Normal caliber aorta with slight atherosclerotic calcifications along the aorta and branch vessels. No specific abnormal lymph node enlargement seen in the abdomen and pelvis. Reproductive: Status post hysterectomy. No adnexal masses. Other: No free air or free fluid. Musculoskeletal: Scattered degenerative changes of the spine and pelvis. Trace retrolisthesis at T11 on T12 and anterolisthesis of L3 on L4. Degenerative changes of the pelvis. IMPRESSION: Dilated gallbladder with stones including a stone which may be impacted in the neck of the gallbladder. Acute cholecystitis is possible. Recommend further workup such as HIDA scan or ultrasound. Scattered colonic diverticula. Normal appendix. No bowel obstruction. IVC filter. Mild bilateral renal atrophy. No obstructing stones. Benign-appearing Bosniak 1 renal cysts. Enlarged heart with a large-bore catheter. Electronically Signed   By: Karen Kays M.D.   On: 12/12/2022 16:17   DG Chest 2 View  Result Date: 12/12/2022 CLINICAL DATA:  Shortness of breath. EXAM: CHEST - 2 VIEW COMPARISON:  None Available. FINDINGS: Heart is enlarged. A right  subclavian dialysis catheter is in place. The tip turns medially and may be near the mitral valve. No edema or effusion is present. No focal airspace disease is present. The lung volumes are low. IVC filter is noted. Degenerative changes are present in thoracic spine. Surgical clips are present in the left axilla. IMPRESSION: 1. Cardiomegaly without failure. 2. No acute cardiopulmonary disease. 3. Right subclavian dialysis catheter is in place. Electronically Signed   By: Marin Roberts M.D.   On: 12/12/2022 12:18        Scheduled Meds:  insulin aspart  0-6 Units Subcutaneous TID WC   insulin glargine-yfgn  10 Units Subcutaneous QHS   Continuous Infusions:  heparin 750 Units/hr (12/13/22 0718)          Glade Lloyd, MD [facility] 12/13/2022, 7:43 AM

## 2022-12-13 NOTE — ED Notes (Signed)
IV team at bedside 

## 2022-12-13 NOTE — ED Notes (Signed)
Pt was found to be saturated in foul-smelling urine. Performed full linen change and bedside cleaning and applied purewick due to pt's bedbound status and chronic illness. RN assisted pt in setting up for breakfast, and pt states she is able to feed herself. No additional needs or concerns identified and pt is currently seated in high fowler's for meal.

## 2022-12-13 NOTE — ED Notes (Signed)
ED TO INPATIENT HANDOFF REPORT  ED Nurse Name and Phone #: Bambie Pizzolato  S Name/Age/Gender Samantha [name] 77 y.o. female Room/Bed: 020C/020C  Code Status   Code Status: Full Code  Home/SNF/Other Home Patient oriented to: self, place, time, and situation Is this baseline? Yes   Triage Complete: Triage complete  Chief Complaint Acute cholecystitis [K81.0]  Triage Note Per [facility] pt coming from air port- had dialysis yesterday back home. When landed began feeling short of breath. States symptoms have resolved however family still wanting patient to be evaluated.    Allergies No Known Allergies  Level of Care/Admitting Diagnosis ED Disposition     ED Disposition  Admit   Condition  --   Comment  Hospital Area: [facility] [[id number]]  Level of Care: Progressive [102]  Admit to Progressive based on following criteria: MULTISYSTEM THREATS such as stable sepsis, metabolic/electrolyte imbalance with or without encephalopathy that is responding to early treatment.  May admit patient to [facility] or [facility] if equivalent level of care is available:: No  Covid Evaluation: Asymptomatic - no recent exposure (last 10 days) testing not required  Diagnosis: Acute cholecystitis [575.0.ICD-9-CM]  Admitting Physician: Angie Fava [[id number]]  Attending Physician: Angie Fava [[id number]]  Certification:: I certify this patient will need inpatient services for at least 2 midnights  Estimated Length of Stay: 2          B Medical/Surgery History 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   Dementia    Diabetes mellitus without complication    ESRD (end stage renal disease) on dialysis    Heart failure    HTN (hypertension)    PEG (percutaneous endoscopic gastrostomy) status    Renal disorder    Thyroid disease    Past Surgical History:  Procedure Laterality Date   ABDOMINAL  HYSTERECTOMY       A IV Location/Drains/Wounds Patient Lines/Drains/Airways Status     Active Line/Drains/Airways     Name Placement date Placement time Site Days   Peripheral IV 12/12/22 22 G Anterior;Left;Upper Arm 12/12/22  1857  Arm  1            Intake/Output Last 24 hours No intake or output data in the 24 hours ending 12/13/22 0320  Labs/Imaging Results for orders placed or performed during the hospital encounter of 12/12/22 (from the past 48 hour(s))  Magnesium     Status: None   Collection Time: 12/12/22  7:17 PM  Result Value Ref Range   Magnesium 2.4 1.7 - 2.4 mg/dL    Comment: Performed at [facility], [address], [place], [place] [address]  CBC with Differential     Status: Abnormal   Collection Time: 12/12/22  7:17 PM  Result Value Ref Range   WBC 10.1 4.0 - 10.5 K/uL   RBC 3.78 (L) 3.87 - 5.11 MIL/uL   Hemoglobin 11.1 (L) 12.0 - 15.0 g/dL   HCT 95.2 (L) 84.1 - 32.4 %   MCV 92.9 80.0 - 100.0 fL   MCH 29.4 26.0 - 34.0 pg   MCHC 31.6 30.0 - 36.0 g/dL   RDW 40.1 (H) 02.7 - 25.3 %   Platelets 176 150 - 400 K/uL   nRBC 0.2 0.0 - 0.2 %   Neutrophils Relative % 61 %   Neutro Abs 6.1 1.7 - 7.7 K/uL   Lymphocytes Relative 28 %   Lymphs Abs 2.8  0.7 - 4.0 K/uL   Monocytes Relative 8 %   Monocytes Absolute 0.9 0.1 - 1.0 K/uL   Eosinophils Relative 2 %   Eosinophils Absolute 0.2 0.0 - 0.5 K/uL   Basophils Relative 0 %   Basophils Absolute 0.0 0.0 - 0.1 K/uL   Immature Granulocytes 1 %   Abs Immature Granulocytes 0.13 (H) 0.00 - 0.07 K/uL    Comment: Performed at [facility], [address], [place], [place] [address]  Comprehensive metabolic panel     Status: Abnormal   Collection Time: 12/12/22  7:17 PM  Result Value Ref Range   Sodium 134 (L) 135 - 145 mmol/L   Potassium 3.6 3.5 - 5.1 mmol/L   Chloride 95 (L) 98 - 111 mmol/L   CO2 23 22 - 32 mmol/L   Glucose, Bld 166 (H) 70 - 99 mg/dL    Comment: Glucose reference range applies only  to samples taken after fasting for at least 8 hours.   BUN 39 (H) 8 - 23 mg/dL   Creatinine, Ser 2.95 (H) 0.44 - 1.00 mg/dL   Calcium 9.7 8.9 - 62.1 mg/dL   Total Protein 8.1 6.5 - 8.1 g/dL   Albumin 4.2 3.5 - 5.0 g/dL   AST 27 15 - 41 U/L   ALT 26 0 - 44 U/L   Alkaline Phosphatase 63 38 - 126 U/L   Total Bilirubin 0.5 0.3 - 1.2 mg/dL   GFR, Estimated 19 (L) >60 mL/min    Comment: (NOTE) Calculated using the CKD-EPI Creatinine Equation (2021)    Anion gap 16 (H) 5 - 15    Comment: Performed at [facility], [address], [place], [place] [address]  CBG monitoring, ED     Status: Abnormal   Collection Time: 12/12/22 11:38 PM  Result Value Ref Range   Glucose-Capillary 134 (H) 70 - 99 mg/dL    Comment: Glucose reference range applies only to samples taken after fasting for at least 8 hours.   Comment 1 Notify RN    Comment 2 Document in Chart   Comprehensive metabolic panel     Status: Abnormal   Collection Time: 12/13/22  1:46 AM  Result Value Ref Range   Sodium 131 (L) 135 - 145 mmol/L   Potassium 3.3 (L) 3.5 - 5.1 mmol/L   Chloride 95 (L) 98 - 111 mmol/L   CO2 24 22 - 32 mmol/L   Glucose, Bld 136 (H) 70 - 99 mg/dL    Comment: Glucose reference range applies only to samples taken after fasting for at least 8 hours.   BUN 43 (H) 8 - 23 mg/dL   Creatinine, Ser 7.84 (H) 0.44 - 1.00 mg/dL   Calcium 9.1 8.9 - 69.6 mg/dL   Total Protein 7.1 6.5 - 8.1 g/dL   Albumin 3.7 3.5 - 5.0 g/dL   AST 25 15 - 41 U/L   ALT 24 0 - 44 U/L   Alkaline Phosphatase 54 38 - 126 U/L   Total Bilirubin 0.4 0.3 - 1.2 mg/dL   GFR, Estimated 17 (L) >60 mL/min    Comment: (NOTE) Calculated using the CKD-EPI Creatinine Equation (2021)    Anion gap 12 5 - 15    Comment: Performed at [facility], [address] [place], [place] [address]  Lactic acid, plasma     Status: None   Collection Time: 12/13/22  1:46 AM  Result Value Ref Range   Lactic Acid, Venous 1.0  0.5 - 1.9 mmol/L     Comment: Performed at [facility], [address] [place], [place] [address]  CBC     Status: Abnormal   Collection Time: 12/13/22  1:46 AM  Result Value Ref Range   WBC 8.8 4.0 - 10.5 K/uL   RBC 3.34 (L) 3.87 - 5.11 MIL/uL   Hemoglobin 10.1 (L) 12.0 - 15.0 g/dL   HCT 11.9 (L) 14.7 - 82.9 %   MCV 90.4 80.0 - 100.0 fL   MCH 30.2 26.0 - 34.0 pg   MCHC 33.4 30.0 - 36.0 g/dL   RDW 56.2 (H) 13.0 - 86.5 %   Platelets 147 (L) 150 - 400 K/uL   nRBC 0.0 0.0 - 0.2 %    Comment: Performed at [facility], [address] [place], [place] [address]  Protime-INR     Status: Abnormal   Collection Time: 12/13/22  1:46 AM  Result Value Ref Range   Prothrombin Time 18.1 (H) 11.4 - 15.2 seconds   INR 1.5 (H) 0.8 - 1.2    Comment: (NOTE) INR goal varies based on device and disease states. Performed at [facility], [address] [place], [place] [address]   Magnesium     Status: None   Collection Time: 12/13/22  1:46 AM  Result Value Ref Range   Magnesium 2.2 1.7 - 2.4 mg/dL    Comment: Performed at [facility], [address], [place], [place] [address]  Phosphorus     Status: Abnormal   Collection Time: 12/13/22  1:46 AM  Result Value Ref Range   Phosphorus 2.1 (L) 2.5 - 4.6 mg/dL    Comment: Performed at [facility], [address] [place], [place] [address]  Differential     Status: Abnormal   Collection Time: 12/13/22  1:46 AM  Result Value Ref Range   Neutrophils Relative % 58 %   Neutro Abs 5.2 1.7 - 7.7 K/uL   Lymphocytes Relative 29 %   Lymphs Abs 2.6 0.7 - 4.0 K/uL   Monocytes Relative 9 %   Monocytes Absolute 0.8 0.1 - 1.0 K/uL   Eosinophils Relative 2 %   Eosinophils Absolute 0.2 0.0 - 0.5 K/uL   Basophils Relative 1 %   Basophils Absolute 0.1 0.0 - 0.1 K/uL   Immature Granulocytes 1 %   Abs Immature Granulocytes 0.09 (H) 0.00 - 0.07 K/uL    Comment: Performed at [facility], [address] [place], [place] [address]   US  Abdomen Limited RUQ (LIVER/GB)  Result Date: 12/12/2022 CLINICAL DATA:  Cholelithiasis. EXAM: ULTRASOUND ABDOMEN LIMITED RIGHT UPPER QUADRANT COMPARISON:  Same day CT abdomen pelvis. FINDINGS: Gallbladder: Gallstones are noted, measuring up to 2.7 cm in diameter. No gallbladder wall thickening visualized. A positive sonographic Eulah Pont sign was noted by sonographer. Common bile duct: Diameter: 4 mm Liver: No focal lesion identified. Within normal limits in parenchymal echogenicity. Portal vein is patent on color Doppler imaging with normal direction of blood flow towards the liver. Other: A 2.0 cm cyst is seen in the upper pole the right kidney. IMPRESSION: Cholelithiasis with a positive sonographic Murphy sign, but no gallbladder wall thickening or pericholecystic fluid. Findings are suggestive of acute cholecystitis. Electronically Signed   By: Romona Curls M.D.   On: 12/12/2022 19:15   CT ABDOMEN PELVIS WO CONTRAST  Result Date: 12/12/2022 CLINICAL DATA:  Nonlocalized abdominal pain EXAM: CT ABDOMEN AND PELVIS WITHOUT CONTRAST  TECHNIQUE: Multidetector CT imaging of the abdomen and pelvis was performed following the standard protocol without IV contrast. RADIATION DOSE REDUCTION: This exam was performed according to the departmental dose-optimization program which includes automated exposure control, adjustment of the mA and/or kV according to patient size and/or use of iterative reconstruction technique. COMPARISON:  None Available. FINDINGS: Lower chest: Heart is slightly enlarged. Large-bore catheter seen along the right side of the heart. Breathing motion at the lung bases. Mild basilar scar or atelectasis. Hepatobiliary: Dilated gallbladder with stones including a stone which may be impacted in the neck of the gallbladder. Please correlate for other signs of acute cholecystitis recommend further evaluation such as ultrasound or HIDA scan. Liver is otherwise grossly preserved on this non IV contrast  exam. Pancreas: Unremarkable. No pancreatic ductal dilatation or surrounding inflammatory changes. Spleen: Normal in size without focal abnormality. Adrenals/Urinary Tract: The adrenal glands are preserved. Mild bilateral renal atrophy. No abnormal calcification is seen within either kidney nor along the course of either ureter. Preserved contours of the urinary bladder. There is a cystic lesion along the upper pole of the right kidney measuring Hounsfield unit of less than 0 and diameter of 2.4 cm. Smaller focus more lateral as well in the right kidney measuring also Hounsfield unit of less than 0 and diameter approaching 1.5 cm. Likely both benign cysts. Specific imaging follow-up. Stomach/Bowel: Large bowel has a normal course and caliber with scattered stool. Few left-sided colonic diverticula. Normal retrocecal appendix. Small bowel is nondilated. G-tube in the stomach. Vascular/Lymphatic: IVC filter in place. Normal caliber aorta with slight atherosclerotic calcifications along the aorta and branch vessels. No specific abnormal lymph node enlargement seen in the abdomen and pelvis. Reproductive: Status post hysterectomy. No adnexal masses. Other: No free air or free fluid. Musculoskeletal: Scattered degenerative changes of the spine and pelvis. Trace retrolisthesis at T11 on T12 and anterolisthesis of L3 on L4. Degenerative changes of the pelvis. IMPRESSION: Dilated gallbladder with stones including a stone which may be impacted in the neck of the gallbladder. Acute cholecystitis is possible. Recommend further workup such as HIDA scan or ultrasound. Scattered colonic diverticula. Normal appendix. No bowel obstruction. IVC filter. Mild bilateral renal atrophy. No obstructing stones. Benign-appearing Bosniak 1 renal cysts. Enlarged heart with a large-bore catheter. Electronically Signed   By: Karen Kays M.D.   On: 12/12/2022 16:17   DG Chest 2 View  Result Date: 12/12/2022 CLINICAL DATA:  Shortness of  breath. EXAM: CHEST - 2 VIEW COMPARISON:  None Available. FINDINGS: Heart is enlarged. A right subclavian dialysis catheter is in place. The tip turns medially and may be near the mitral valve. No edema or effusion is present. No focal airspace disease is present. The lung volumes are low. IVC filter is noted. Degenerative changes are present in thoracic spine. Surgical clips are present in the left axilla. IMPRESSION: 1. Cardiomegaly without failure. 2. No acute cardiopulmonary disease. 3. Right subclavian dialysis catheter is in place. Electronically Signed   By: Marin Roberts M.D.   On: 12/12/2022 12:18    Pending Labs Unresulted Labs (From admission, onward)     Start     Ordered   12/14/22 0500  Heparin level (unfractionated)  Daily,   R      12/13/22 0004   12/14/22 0500  APTT  Daily,   R      12/13/22 0004   12/13/22 0900  Heparin level (unfractionated)  Once-Timed,   TIMED  12/13/22 0004   12/13/22 0900  APTT  Once-Timed,   TIMED        12/13/22 0004   12/13/22 0500  CBC with Differential/Platelet  Tomorrow morning,   R        12/12/22 2115   12/13/22 0200  CBC with Differential/Platelet  Once,   R        12/13/22 0200   12/12/22 1239  Lactic acid, plasma  Now then every 2 hours,   R      12/12/22 1238   12/12/22 1238  Urinalysis, Routine w reflex microscopic -Urine, Clean Catch  Once,   URGENT       Question:  Specimen Source  Answer:  Urine, Clean Catch   12/12/22 1238            Vitals/Pain Today's Vitals   12/13/22 0030 12/13/22 0200 12/13/22 0300 12/13/22 0319  BP: 122/70 106/85 (!) 99/52   Pulse: 79 84 80   Resp: 11 15 15    Temp:    98.8 F (37.1 C)  TempSrc:    Oral  SpO2: 100% 97% 99%   Weight:      Height:      PainSc:        Isolation Precautions No active isolations  Medications Medications  acetaminophen (TYLENOL) tablet 650 mg (has no administration in time range)    Or  acetaminophen (TYLENOL) suppository 650 mg (has no  administration in time range)  melatonin tablet 3 mg (has no administration in time range)  ondansetron (ZOFRAN) injection 4 mg (has no administration in time range)  naloxone (NARCAN) injection 0.4 mg (has no administration in time range)  fentaNYL (SUBLIMAZE) injection 25 mcg (has no administration in time range)  heparin ADULT infusion 100 units/mL (25000 units/239mL) (750 Units/hr Intravenous New Bag/Given 12/13/22 0044)    Mobility non-ambulatory     Focused Assessments Renal Assessment Handoff:  Hemodialysis Schedule: Hemodialysis Schedule: Monday/Wednesday/Friday Last Hemodialysis date and time: 12/12/22   Restricted appendage:    R Recommendations: See Admitting Provider Note  Report given to:   Additional Notes:

## 2022-12-13 NOTE — ED Notes (Signed)
Placed order for IV team consult to initiate 2nd IV due to heparin infusion with multiple abx ordered and RUE limb restriction.

## 2022-12-13 NOTE — Progress Notes (Signed)
ANTICOAGULATION CONSULT NOTE - Follow-up Consult  Pharmacy Consult for IV heparin Indication: atrial fibrillation  No Known Allergies  Patient Measurements: Height:  (149.9 cm) Weight: 63.5 kg (140 lb) IBW/kg (Calculated) : 43.2 Heparin Dosing Weight: 56 kg  Vital Signs: Temp: 98.2 F (36.8 C) (04/21 1253) Temp Source: Oral (04/21 1253) BP: 128/69 (04/21 1145) Pulse Rate: 76 (04/21 1145)  Labs: Recent Labs    12/12/22 1917 12/13/22 0146 12/13/22 0845 12/13/22 1103  HGB 11.1* 10.1*  --   --   HCT 35.1* 30.2*  --   --   PLT 176 147*  --   --   APTT  --   --  118* 60*  LABPROT  --  18.1*  --   --   INR  --  1.5*  --   --   HEPARINUNFRC  --   --  >1.10*  --   CREATININE 2.60* 2.76*  --   --      Estimated Creatinine Clearance: 14 mL/min (A) (by C-G formula based on SCr of 2.76 mg/dL (H)).   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  Last dose taken 4/19 in the PM per patient report.  Pharmacy asked to start IV heparin while Eliquis on hold for possible surgical need.   aPTT elevated at 118 on current rate of 750 units/hr. No bolus was given. HL elevated in setting of recent Eliquis. Heparin drawn from same site as infusion so suspect effecting level.   Re-draw aPTT slightly subtherapeutic at 60 seconds  Goal of Therapy:  Heparin level  0.3-0.7 units/ml aPTT 66-102 seconds Monitor platelets by anticoagulation protocol: Yes   Plan:  Increase heparin gtt to 850 units/hr F/u 8 hour aPTT/HL  [name], PharmD, [facility] Clinical Pharmacist ED Pharmacist Phone # [phone] 12/13/2022 1:11 PM

## 2022-12-13 NOTE — Progress Notes (Signed)
ANTICOAGULATION CONSULT NOTE - Initial Consult  Pharmacy Consult for IV heparin Indication: atrial fibrillation  No Known Allergies  Patient Measurements: Height:  (149.9 cm) Weight: 63.5 kg (140 lb) IBW/kg (Calculated) : 43.2 Heparin Dosing Weight: 56 kg  Vital Signs: Temp: 99 F (37.2 C) (04/20 1537) Temp Source: Oral (04/20 1537) BP: 144/82 (04/20 2330) Pulse Rate: 81 (04/20 2330)  Labs: Recent Labs    12/12/22 1917  HGB 11.1*  HCT 35.1*  PLT 176  CREATININE 2.60*    Estimated Creatinine Clearance: 14.9 mL/min (A) (by C-G formula based on SCr of 2.6 mg/dL (H)).   Medical History: 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   Dementia    Diabetes mellitus without complication    ESRD (end stage renal disease) on dialysis    Heart failure    HTN (hypertension)    PEG (percutaneous endoscopic gastrostomy) status    Renal disorder    Thyroid disease     Medications:  Infusions:   heparin      Assessment: 77 yo female on chronic Eliquis for "clot in port".  Last dose taken 4/19 in the PM per patient report.  Pharmacy asked to start IV heparin while Eliquis on hold for   Goal of Therapy:  Heparin level 0.3-0.7 units/ml Monitor platelets by anticoagulation protocol: Yes   Plan:  Start IV heparin without a bolus at 750 units/hr. Check heparin level 8 hrs after gtt starts Daily heparin level and CBC.  Reece Leader, Colon Flattery, [facility] Clinical Pharmacist  12/13/2022 12:07 AM   [facility] pharmacy phone numbers are listed on [email or web address]

## 2022-12-13 NOTE — ED Notes (Signed)
ED TO INPATIENT HANDOFF REPORT  ED Nurse Name and Phone #: Katheran James RN  S Name/Age/Gender Samantha [name] 77 y.o. female Room/Bed: 020C/020C  Code Status   Code Status: Full Code  Home/SNF/Other Home Patient oriented to: self, place, time, and situation Is this baseline? Yes   Triage Complete: Triage complete  Chief Complaint Acute cholecystitis [K81.0]  Triage Note Per [facility] pt coming from air port- had dialysis yesterday back home. When landed began feeling short of breath. States symptoms have resolved however family still wanting patient to be evaluated.    Allergies No Known Allergies  Level of Care/Admitting Diagnosis ED Disposition     ED Disposition  Admit   Condition  --   Comment  Hospital Area: [facility] [[id number]]  Level of Care: Progressive [102]  Admit to Progressive based on following criteria: MULTISYSTEM THREATS such as stable sepsis, metabolic/electrolyte imbalance with or without encephalopathy that is responding to early treatment.  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   Dementia    Diabetes mellitus without complication    ESRD (end stage renal disease) on dialysis    Heart failure    HTN (hypertension)    PEG (percutaneous endoscopic gastrostomy) status    Renal disorder    Thyroid disease    Past Surgical History:  Procedure Laterality Date   ABDOMINAL  HYSTERECTOMY       A IV Location/Drains/Wounds Patient Lines/Drains/Airways Status     Active Line/Drains/Airways     Name Placement date Placement time Site Days   Peripheral IV 12/12/22 22 G Anterior;Left;Upper Arm 12/12/22  1857  Arm  1   Peripheral IV 12/13/22 22 G 1" Anterior;Left Forearm 12/13/22  1238  Forearm  less than 1   External Urinary Catheter 12/13/22  0816  --  less than 1            Intake/Output Last 24 hours  Intake/Output Summary (Last 24 hours) at 12/13/2022 1712 Last data filed at 12/13/2022 1554 Gross per 24 hour  Intake 248.77 ml  Output --  Net 248.77 ml    Labs/Imaging Results for orders placed or performed during the hospital encounter of 12/12/22 (from the past 48 hour(s))  Magnesium     Status: None   Collection Time: 12/12/22  7:17 PM  Result Value Ref Range   Magnesium 2.4 1.7 - 2.4 mg/dL    Comment: Performed at [facility], [address], [place], [place] [address]  CBC with Differential     Status: Abnormal   Collection Time: 12/12/22  7:17 PM  Result Value Ref Range   WBC 10.1 4.0 - 10.5 K/uL   RBC 3.78 (L) 3.87 - 5.11 MIL/uL   Hemoglobin 11.1 (L) 12.0 - 15.0 g/dL   HCT 86.5 (L) 78.4 - 69.6 %   MCV 92.9 80.0 - 100.0 fL   MCH 29.4 26.0 - 34.0 pg   MCHC 31.6 30.0 -  36.0 g/dL   RDW 09.8 (H) 11.9 - 14.7 %   Platelets 176 150 - 400 K/uL   nRBC 0.2 0.0 - 0.2 %   Neutrophils Relative % 61 %   Neutro Abs 6.1 1.7 - 7.7 K/uL   Lymphocytes Relative 28 %   Lymphs Abs 2.8 0.7 - 4.0 K/uL   Monocytes Relative 8 %   Monocytes Absolute 0.9 0.1 - 1.0 K/uL   Eosinophils Relative 2 %   Eosinophils Absolute 0.2 0.0 - 0.5 K/uL   Basophils Relative 0 %   Basophils Absolute 0.0 0.0 - 0.1 K/uL   Immature Granulocytes 1 %   Abs Immature Granulocytes 0.13 (H) 0.00 - 0.07 K/uL    Comment: Performed at [facility], [address] [place], [place] [address]  Comprehensive metabolic panel     Status: Abnormal   Collection Time:  12/12/22  7:17 PM  Result Value Ref Range   Sodium 134 (L) 135 - 145 mmol/L   Potassium 3.6 3.5 - 5.1 mmol/L   Chloride 95 (L) 98 - 111 mmol/L   CO2 23 22 - 32 mmol/L   Glucose, Bld 166 (H) 70 - 99 mg/dL    Comment: Glucose reference range applies only to samples taken after fasting for at least 8 hours.   BUN 39 (H) 8 - 23 mg/dL   Creatinine, Ser 2.13 (H) 0.44 - 1.00 mg/dL   Calcium 9.7 8.9 - 08.6 mg/dL   Total Protein 8.1 6.5 - 8.1 g/dL   Albumin 4.2 3.5 - 5.0 g/dL   AST 27 15 - 41 U/L   ALT 26 0 - 44 U/L   Alkaline Phosphatase 63 38 - 126 U/L   Total Bilirubin 0.5 0.3 - 1.2 mg/dL   GFR, Estimated 19 (L) >60 mL/min    Comment: (NOTE) Calculated using the CKD-EPI Creatinine Equation (2021)    Anion gap 16 (H) 5 - 15    Comment: Performed at [facility], [address], [place], [place] [address]  CBG monitoring, ED     Status: Abnormal   Collection Time: 12/12/22 11:38 PM  Result Value Ref Range   Glucose-Capillary 134 (H) 70 - 99 mg/dL    Comment: Glucose reference range applies only to samples taken after fasting for at least 8 hours.   Comment 1 Notify RN    Comment 2 Document in Chart   Comprehensive metabolic panel     Status: Abnormal   Collection Time: 12/13/22  1:46 AM  Result Value Ref Range   Sodium 131 (L) 135 - 145 mmol/L   Potassium 3.3 (L) 3.5 - 5.1 mmol/L   Chloride 95 (L) 98 - 111 mmol/L   CO2 24 22 - 32 mmol/L   Glucose, Bld 136 (H) 70 - 99 mg/dL    Comment: Glucose reference range applies only to samples taken after fasting for at least 8 hours.   BUN 43 (H) 8 - 23 mg/dL   Creatinine, Ser 9.62 (H) 0.44 - 1.00 mg/dL   Calcium 9.1 8.9 - 95.2 mg/dL   Total Protein 7.1 6.5 - 8.1 g/dL   Albumin 3.7 3.5 - 5.0 g/dL   AST 25 15 - 41 U/L   ALT 24 0 - 44 U/L   Alkaline Phosphatase 54 38 - 126 U/L   Total Bilirubin 0.4 0.3 - 1.2 mg/dL   GFR, Estimated 17 (L) >60 mL/min    Comment: (NOTE) Calculated using the CKD-EPI Creatinine Equation (2021)  Anion gap 12 5 - 15    Comment: Performed at [facility], [address] [place], [place] [address]  Lactic acid, plasma     Status: None   Collection Time: 12/13/22  1:46 AM  Result Value Ref Range   Lactic Acid, Venous 1.0 0.5 - 1.9 mmol/L    Comment: Performed at [facility], [address] [place], [place] [address]  CBC     Status: Abnormal   Collection Time: 12/13/22  1:46 AM  Result Value Ref Range   WBC 8.8 4.0 - 10.5 K/uL   RBC 3.34 (L) 3.87 - 5.11 MIL/uL   Hemoglobin 10.1 (L) 12.0 - 15.0 g/dL   HCT 09.8 (L) 11.9 - 14.7 %   MCV 90.4 80.0 - 100.0 fL   MCH 30.2 26.0 - 34.0 pg   MCHC 33.4 30.0 - 36.0 g/dL   RDW 82.9 (H) 56.2 - 13.0 %   Platelets 147 (L) 150 - 400 K/uL   nRBC 0.0 0.0 - 0.2 %    Comment: Performed at [facility], [address] [place], [place] [address]  Protime-INR     Status: Abnormal   Collection Time: 12/13/22  1:46 AM  Result Value Ref Range   Prothrombin Time 18.1 (H) 11.4 - 15.2 seconds   INR 1.5 (H) 0.8 - 1.2    Comment: (NOTE) INR goal varies based on device and disease states. Performed at [facility], [address], [place], [place] [address]   Magnesium     Status: None   Collection Time: 12/13/22  1:46 AM  Result Value Ref Range   Magnesium 2.2 1.7 - 2.4 mg/dL    Comment: Performed at [facility], [address], [place], [place] [address]  Phosphorus     Status: Abnormal   Collection Time: 12/13/22  1:46 AM  Result Value Ref Range   Phosphorus 2.1 (L) 2.5 - 4.6 mg/dL    Comment: Performed at [facility], [address], [place], [place] [address]  Differential     Status: Abnormal   Collection Time: 12/13/22  1:46 AM  Result Value Ref Range   Neutrophils Relative % 58 %   Neutro Abs 5.2 1.7 - 7.7 K/uL   Lymphocytes Relative 29 %   Lymphs Abs 2.6 0.7 - 4.0 K/uL   Monocytes Relative 9 %   Monocytes Absolute 0.8 0.1 - 1.0 K/uL   Eosinophils Relative 2 %   Eosinophils Absolute 0.2 0.0 -  0.5 K/uL   Basophils Relative 1 %   Basophils Absolute 0.1 0.0 - 0.1 K/uL   Immature Granulocytes 1 %   Abs Immature Granulocytes 0.09 (H) 0.00 - 0.07 K/uL    Comment: Performed at [facility], [address], [place], [place] [address]  CBG monitoring, ED     Status: Abnormal   Collection Time: 12/13/22  7:26 AM  Result Value Ref Range   Glucose-Capillary 100 (H) 70 - 99 mg/dL    Comment: Glucose reference range applies only to samples taken after fasting for at least 8 hours.  Heparin level (unfractionated)     Status: Abnormal   Collection Time: 12/13/22  8:45 AM  Result Value Ref Range   Heparin Unfractionated >1.10 (H) 0.30 - 0.70 IU/mL    Comment: (NOTE) The clinical reportable range upper limit is being lowered to >1.10 to align with the FDA approved guidance for the current laboratory assay.  If heparin results are below expected values, and patient dosage has  been confirmed, suggest follow up testing of antithrombin III levels. Performed at [facility], [address], [place], [place] [address]   APTT     Status: Abnormal   Collection Time: 12/13/22  8:45 AM  Result Value Ref Range   aPTT 118 (H) 24 - 36 seconds    Comment:        IF BASELINE aPTT IS ELEVATED, SUGGEST PATIENT RISK ASSESSMENT BE USED TO DETERMINE APPROPRIATE ANTICOAGULANT THERAPY. Performed at [facility], [address] [place], [place] [address]   APTT     Status: Abnormal   Collection Time: 12/13/22 11:03 AM  Result Value Ref Range   aPTT 60 (H) 24 - 36 seconds    Comment:        IF BASELINE aPTT IS ELEVATED, SUGGEST PATIENT RISK ASSESSMENT BE USED TO DETERMINE APPROPRIATE ANTICOAGULANT THERAPY. Performed at [facility], [address], [place], [place] [address]   Urinalysis, Routine w reflex microscopic -Urine, Clean Catch     Status: Abnormal   Collection Time: 12/13/22 11:04 AM  Result Value Ref Range   Color, Urine YELLOW YELLOW   APPearance HAZY (A)  CLEAR   Specific Gravity, Urine 1.011 1.005 - 1.030   pH 5.0 5.0 - 8.0   Glucose, UA 150 (A) NEGATIVE mg/dL   Hgb urine dipstick MODERATE (A) NEGATIVE   Bilirubin Urine NEGATIVE NEGATIVE   Ketones, ur NEGATIVE NEGATIVE mg/dL   Protein, ur 914 (A) NEGATIVE mg/dL   Nitrite NEGATIVE NEGATIVE   Leukocytes,Ua NEGATIVE NEGATIVE   RBC / HPF 6-10 0 - 5 RBC/hpf   WBC, UA 0-5 0 - 5 WBC/hpf   Bacteria, UA RARE (A) NONE SEEN   Squamous Epithelial / HPF 0-5 0 - 5 /HPF   Mucus PRESENT     Comment: Performed at [facility], [address], [place], [place] [address]  CBG monitoring, ED     Status: Abnormal   Collection Time: 12/13/22 12:14 PM  Result Value Ref Range   Glucose-Capillary 130 (H) 70 - 99 mg/dL    Comment: Glucose reference range applies only to samples taken after fasting for at least 8 hours.   US Abdomen Limited RUQ (LIVER/GB)  Result Date: 12/12/2022 CLINICAL DATA:  Cholelithiasis. EXAM: ULTRASOUND ABDOMEN LIMITED RIGHT UPPER QUADRANT COMPARISON:  Same day CT abdomen pelvis. FINDINGS: Gallbladder: Gallstones are noted, measuring up to 2.7 cm in diameter. No gallbladder wall thickening visualized. A positive sonographic Eulah Pont sign was noted by sonographer. Common bile duct: Diameter: 4 mm Liver: No focal lesion identified. Within normal limits in parenchymal echogenicity. Portal vein is patent on color Doppler imaging with normal direction of blood flow towards the liver. Other: A 2.0 cm cyst is seen in the upper pole the right kidney. IMPRESSION: Cholelithiasis with a positive sonographic Murphy sign, but no gallbladder wall thickening or pericholecystic fluid. Findings are suggestive of acute cholecystitis. Electronically Signed   By: Romona Curls M.D.   On: 12/12/2022 19:15   CT ABDOMEN PELVIS WO CONTRAST  Result Date: 12/12/2022 CLINICAL DATA:  Nonlocalized abdominal pain EXAM: CT ABDOMEN AND PELVIS WITHOUT CONTRAST TECHNIQUE: Multidetector CT imaging of the abdomen and  pelvis was performed following the standard protocol without IV contrast. RADIATION DOSE REDUCTION: This exam was performed according to the departmental dose-optimization program which includes automated exposure control, adjustment of the mA and/or kV according to patient size  and/or use of iterative reconstruction technique. COMPARISON:  None Available. FINDINGS: Lower chest: Heart is slightly enlarged. Large-bore catheter seen along the right side of the heart. Breathing motion at the lung bases. Mild basilar scar or atelectasis. Hepatobiliary: Dilated gallbladder with stones including a stone which may be impacted in the neck of the gallbladder. Please correlate for other signs of acute cholecystitis recommend further evaluation such as ultrasound or HIDA scan. Liver is otherwise grossly preserved on this non IV contrast exam. Pancreas: Unremarkable. No pancreatic ductal dilatation or surrounding inflammatory changes. Spleen: Normal in size without focal abnormality. Adrenals/Urinary Tract: The adrenal glands are preserved. Mild bilateral renal atrophy. No abnormal calcification is seen within either kidney nor along the course of either ureter. Preserved contours of the urinary bladder. There is a cystic lesion along the upper pole of the right kidney measuring Hounsfield unit of less than 0 and diameter of 2.4 cm. Smaller focus more lateral as well in the right kidney measuring also Hounsfield unit of less than 0 and diameter approaching 1.5 cm. Likely both benign cysts. Specific imaging follow-up. Stomach/Bowel: Large bowel has a normal course and caliber with scattered stool. Few left-sided colonic diverticula. Normal retrocecal appendix. Small bowel is nondilated. G-tube in the stomach. Vascular/Lymphatic: IVC filter in place. Normal caliber aorta with slight atherosclerotic calcifications along the aorta and branch vessels. No specific abnormal lymph node enlargement seen in the abdomen and pelvis.  Reproductive: Status post hysterectomy. No adnexal masses. Other: No free air or free fluid. Musculoskeletal: Scattered degenerative changes of the spine and pelvis. Trace retrolisthesis at T11 on T12 and anterolisthesis of L3 on L4. Degenerative changes of the pelvis. IMPRESSION: Dilated gallbladder with stones including a stone which may be impacted in the neck of the gallbladder. Acute cholecystitis is possible. Recommend further workup such as HIDA scan or ultrasound. Scattered colonic diverticula. Normal appendix. No bowel obstruction. IVC filter. Mild bilateral renal atrophy. No obstructing stones. Benign-appearing Bosniak 1 renal cysts. Enlarged heart with a large-bore catheter. Electronically Signed   By: Karen Kays M.D.   On: 12/12/2022 16:17   DG Chest 2 View  Result Date: 12/12/2022 CLINICAL DATA:  Shortness of breath. EXAM: CHEST - 2 VIEW COMPARISON:  None Available. FINDINGS: Heart is enlarged. A right subclavian dialysis catheter is in place. The tip turns medially and may be near the mitral valve. No edema or effusion is present. No focal airspace disease is present. The lung volumes are low. IVC filter is noted. Degenerative changes are present in thoracic spine. Surgical clips are present in the left axilla. IMPRESSION: 1. Cardiomegaly without failure. 2. No acute cardiopulmonary disease. 3. Right subclavian dialysis catheter is in place. Electronically Signed   By: Marin Roberts M.D.   On: 12/12/2022 12:18    Pending Labs Unresulted Labs (From admission, onward)     Start     Ordered   12/14/22 0500  Heparin level (unfractionated)  Daily,   R      12/13/22 0004   12/14/22 0500  APTT  Daily,   R      12/13/22 0004   12/14/22 0500  Comprehensive metabolic panel  Tomorrow morning,   R        12/13/22 1105   12/14/22 0500  Magnesium  Tomorrow morning,   R        12/13/22 1105   12/13/22 2100  APTT  Once-Timed,   TIMED       See Hyperspace for full Linked Orders  Report.    12/13/22 1314   12/13/22 2100  Heparin level (unfractionated)  Once-Timed,   TIMED       See Hyperspace for full Linked Orders Report.   12/13/22 1314   12/13/22 0610  Hemoglobin A1c  Add-on,   AD       Comments: To assess prior glycemic control    12/13/22 0609   12/13/22 0500  CBC with Differential/Platelet  Tomorrow morning,   R        12/12/22 2115   12/13/22 0200  CBC with Differential/Platelet  Once,   R        12/13/22 0200   12/12/22 1239  Lactic acid, plasma  Now then every 2 hours,   R (with STAT occurrences)      12/12/22 1238            Vitals/Pain Today's Vitals   12/13/22 1145 12/13/22 1253 12/13/22 1300 12/13/22 1600  BP: 128/69  (!) 138/42 (!) 152/50  Pulse: 76  80 70  Resp: 15  13 15   Temp:  98.2 F (36.8 C)    TempSrc:  Oral    SpO2: 99%  99% 100%  Weight:      Height:      PainSc:  Asleep      Isolation Precautions No active isolations  Medications Medications  acetaminophen (TYLENOL) tablet 650 mg (has no administration in time range)    Or  acetaminophen (TYLENOL) suppository 650 mg (has no administration in time range)  melatonin tablet 3 mg (has no administration in time range)  ondansetron (ZOFRAN) injection 4 mg (has no administration in time range)  naloxone (NARCAN) injection 0.4 mg (has no administration in time range)  fentaNYL (SUBLIMAZE) injection 25 mcg (has no administration in time range)  heparin ADULT infusion 100 units/mL (25000 units/274mL) (850 Units/hr Intravenous Rate/Dose Change 12/13/22 1324)  insulin glargine-yfgn (SEMGLEE) injection 10 Units (has no administration in time range)  insulin aspart (novoLOG) injection 0-6 Units ( Subcutaneous Not Given 12/13/22 1218)  cefTRIAXone (ROCEPHIN) 2 g in sodium chloride 0.9 % 100 mL IVPB (0 g Intravenous Stopped 12/13/22 1324)  metroNIDAZOLE (FLAGYL) IVPB 500 mg (0 mg Intravenous Stopped 12/13/22 1554)  0.9 %  sodium chloride infusion ( Intravenous New Bag/Given 12/13/22 1244)     Mobility non-ambulatory     Focused Assessments Hemodialysis pt with RUQ abdominal pain found to have cholecystitis. RUE restricted, BP cuff currently on left lower leg . A/O x 4, uses hoyer lift at home and non-ambulatory.    R Recommendations: See Admitting Provider Note  Report given to:   Additional Notes: Hemodialysis pt with RUQ abdominal pain found to have cholecystitis. RUE restricted, BP cuff currently on left lower leg. A/O x 4, uses hoyer lift at home and non-ambulatory. Pt does make urine but is incontinent.

## 2022-12-14 DIAGNOSIS — N186 End stage renal disease: Secondary | ICD-10-CM | POA: Diagnosis not present

## 2022-12-14 DIAGNOSIS — K810 Acute cholecystitis: Secondary | ICD-10-CM | POA: Diagnosis not present

## 2022-12-14 DIAGNOSIS — Z86718 Personal history of other venous thrombosis and embolism: Secondary | ICD-10-CM | POA: Diagnosis not present

## 2022-12-14 DIAGNOSIS — Z992 Dependence on renal dialysis: Secondary | ICD-10-CM | POA: Diagnosis not present

## 2022-12-14 LAB — COMPREHENSIVE METABOLIC PANEL
ALT: 23 U/L (ref 0–44)
AST: 27 U/L (ref 15–41)
Albumin: 3.2 g/dL — ABNORMAL LOW (ref 3.5–5.0)
Alkaline Phosphatase: 47 U/L (ref 38–126)
Anion gap: 10 (ref 5–15)
BUN: 50 mg/dL — ABNORMAL HIGH (ref 8–23)
CO2: 22 mmol/L (ref 22–32)
Calcium: 8.4 mg/dL — ABNORMAL LOW (ref 8.9–10.3)
Chloride: 103 mmol/L (ref 98–111)
Creatinine, Ser: 3.05 mg/dL — ABNORMAL HIGH (ref 0.44–1.00)
GFR, Estimated: 15 mL/min — ABNORMAL LOW (ref >60)
Glucose, Bld: 134 mg/dL — ABNORMAL HIGH (ref 70–99)
Potassium: 3.4 mmol/L — ABNORMAL LOW (ref 3.5–5.1)
Sodium: 135 mmol/L (ref 135–145)
Total Bilirubin: 0.3 mg/dL (ref 0.3–1.2)
Total Protein: 6.2 g/dL — ABNORMAL LOW (ref 6.5–8.1)

## 2022-12-14 LAB — APTT
aPTT: >200 seconds (ref 24–36)
aPTT: 29 seconds (ref 24–36)
aPTT: 196 seconds (ref 24–36)

## 2022-12-14 LAB — CBC
HCT: 30.1 % — ABNORMAL LOW (ref 36.0–46.0)
Hemoglobin: 9.6 g/dL — ABNORMAL LOW (ref 12.0–15.0)
MCH: 30 pg (ref 26.0–34.0)
MCHC: 31.9 g/dL (ref 30.0–36.0)
MCV: 94.1 fL (ref 80.0–100.0)
Platelets: 148 10*3/uL — ABNORMAL LOW (ref 150–400)
RBC: 3.2 MIL/uL — ABNORMAL LOW (ref 3.87–5.11)
RDW: 18.2 % — ABNORMAL HIGH (ref 11.5–15.5)
WBC: 6.7 10*3/uL (ref 4.0–10.5)
nRBC: 0 % (ref 0.0–0.2)

## 2022-12-14 LAB — GLUCOSE, CAPILLARY
Glucose-Capillary: 124 mg/dL — ABNORMAL HIGH (ref 70–99)
Glucose-Capillary: 138 mg/dL — ABNORMAL HIGH (ref 70–99)
Glucose-Capillary: 160 mg/dL — ABNORMAL HIGH (ref 70–99)
Glucose-Capillary: 305 mg/dL — ABNORMAL HIGH (ref 70–99)
Glucose-Capillary: 80 mg/dL (ref 70–99)

## 2022-12-14 LAB — HEPARIN LEVEL (UNFRACTIONATED): Heparin Unfractionated: >1.1 IU/mL — ABNORMAL HIGH (ref 0.30–0.70)

## 2022-12-14 LAB — MAGNESIUM: Magnesium: 2.2 mg/dL (ref 1.7–2.4)

## 2022-12-14 MED ORDER — RENA-VITE PO TABS
1.0000 | ORAL_TABLET | Freq: Every day | ORAL | Status: DC
  Administered 2022-12-14 – 2022-12-24 (×11): 1 via ORAL
  Filled 2022-12-14 (×11): qty 1

## 2022-12-14 MED ORDER — DARBEPOETIN ALFA 60 MCG/0.3ML IJ SOSY
60.0000 ug | PREFILLED_SYRINGE | INTRAMUSCULAR | Status: DC
  Administered 2022-12-16 – 2022-12-23 (×2): 60 ug via SUBCUTANEOUS
  Filled 2022-12-14 (×2): qty 0.3

## 2022-12-14 MED ORDER — HEPARIN (PORCINE) 25000 UT/250ML-% IV SOLN
700.0000 [IU]/h | INTRAVENOUS | Status: DC
  Administered 2022-12-14 (×2): 700 [IU]/h via INTRAVENOUS
  Filled 2022-12-14 (×2): qty 250

## 2022-12-14 MED ORDER — OSMOLITE 1.5 CAL PO LIQD
1000.0000 mL | ORAL | Status: DC
  Administered 2022-12-14 – 2022-12-17 (×4): 1000 mL
  Administered 2022-12-18: 700 mL
  Administered 2022-12-18: 1000 mL
  Filled 2022-12-14 (×8): qty 1000

## 2022-12-14 MED ORDER — HEPARIN (PORCINE) 25000 UT/250ML-% IV SOLN
800.0000 [IU]/h | INTRAVENOUS | Status: DC
  Administered 2022-12-14: 500 [IU]/h via INTRAVENOUS

## 2022-12-14 MED ORDER — HEPARIN SODIUM (PORCINE) 1000 UNIT/ML DIALYSIS
1500.0000 [IU] | Freq: Once | INTRAMUSCULAR | Status: AC
  Administered 2022-12-14: 1500 [IU] via INTRAVENOUS_CENTRAL

## 2022-12-14 MED ORDER — VITAL HIGH PROTEIN PO LIQD
1000.0000 mL | ORAL | Status: DC
  Filled 2022-12-14: qty 1000

## 2022-12-14 MED ORDER — HEPARIN SODIUM (PORCINE) 1000 UNIT/ML DIALYSIS: 750.0000 [IU] | INTRAMUSCULAR | Status: DC | PRN

## 2022-12-14 MED ORDER — FREE WATER
40.0000 mL | Status: AC
  Administered 2022-12-15 (×2): 40 mL

## 2022-12-14 NOTE — Consult Note (Signed)
Consultation Note Date: 12/14/2022   Patient Name: Samantha [name]  DOB: 08/20/1946  MRN: [id number]  Age / Sex: 77 y.o., female  PCP: Patient, No Pcp Per Referring Physician: Starleen Arms, MD  Reason for Consultation: Establishing goals of care  HPI/Patient Profile: 77 y.o. 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 CT of abdomen/pelvis showed dilated gallbladder with stones including a stone which may be impacted in the neck of the gallbladder; acute cholecystitis is possible. Right upper quadrant ultrasound showed findings suggestive of acute cholecystitis. General surgery was consulted. Awaiting HIDA scan.  Family face treatment option decisions, advanced care planning decisions, and anticipatory care needs.  Clinical Assessment and Goals of Care:  Met with the patient and her daughters [place] and [name]. They are hopeful the HIDA will be completed tomorrow. We will have a family meeting tomorrow with all four children. Two children will be calling by phone.  I reviewed medical records, received report from team, assessed the patient and then meet at the patient's bedside to discuss the concept of Palliative Care as a specialized medical care for people and their families living with serious illness.  If focuses on providing relief from the symptoms and stress of a serious illness. The goal is to improve quality of life for both the patient and the family.   The children state they had advanced care directives when they were in [place].  NEXT OF KIN. Patient has four children.    SUMMARY OF RECOMMENDATIONS   Family meeting with all children tomorrow  12/15/22.  Code Status/Advance Care Planning: Full code  Prognosis:  Unable to determine  Discharge Planning: To Be Determined      Primary Diagnoses: Present on Admission:  Epigastric pain  Failure to thrive in adult  Essential hypertension   I have reviewed the medical record, interviewed the patient and family, and examined the patient. The following aspects are pertinent.  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   Dementia    Diabetes mellitus without complication    ESRD (end stage renal disease) on dialysis    Heart failure    HTN (hypertension)    PEG (percutaneous endoscopic gastrostomy) status    Renal disorder    Thyroid disease    Social History   Socioeconomic History   Marital status: Married    Spouse name: Not on file   Number of children: Not on file   Years of education: Not on file   Highest education level: Not on file  Occupational History   Not on file  Tobacco Use   Smoking status: Never   Smokeless tobacco: Not on file  Substance and Sexual Activity   Alcohol use: Not Currently   Drug use: Never  Sexual activity: Not on file  Other Topics Concern   Not on file  Social History Narrative   Not on file   Social Determinants of Health   Financial Resource Strain: Not on file  Food Insecurity: No Food Insecurity (12/12/2022)   Hunger Vital Sign    Worried About Running Out of Food in the Last Year: Never true    Ran Out of Food in the Last Year: Never true  Transportation Needs: No Transportation Needs (12/12/2022)   PRAPARE - Administrator, Civil Service (Medical): No    Lack of Transportation (Non-Medical): No  Physical Activity: Not on file  Stress: Not on file  Social Connections: Not on file   History reviewed. No pertinent family history. Scheduled Meds:  Chlorhexidine Gluconate Cloth  6 each Topical Q0600   [START ON 12/16/2022] darbepoetin  (ARANESP) injection - DIALYSIS  60 mcg Subcutaneous Q Wed-1800   insulin aspart  0-6 Units Subcutaneous TID WC   insulin glargine-yfgn  10 Units Subcutaneous QHS   Continuous Infusions:  cefTRIAXone (ROCEPHIN)  IV 2 g (12/14/22 1238)   feeding supplement (NEPRO CARB STEADY) 1,000 mL (12/13/22 2356)   heparin 700 Units/hr (12/14/22 1259)   metronidazole 500 mg (12/14/22 1229)   PRN Meds:.acetaminophen **OR** acetaminophen, fentaNYL (SUBLIMAZE) injection, melatonin, naLOXone (NARCAN)  injection, ondansetron (ZOFRAN) IV Medications Prior to Admission:  Prior to Admission medications   Medication Sig Start Date End Date Taking? Authorizing Provider  amLODipine (NORVASC) 10 MG tablet Take 10 mg by mouth daily.   Yes [provider]  bisacodyl (DULCOLAX) 5 MG EC tablet Take 5 mg by mouth daily as needed for moderate constipation.   Yes [provider]  carvedilol (COREG) 25 MG tablet Take 25 mg by mouth 2 (two) times daily with a meal.   Yes [provider]  cholecalciferol (VITAMIN D) 1000 UNITS tablet Take 1,000 Units by mouth daily.   Yes [provider]  Cod Liver Oil 1000 MG CAPS Take 1 capsule by mouth daily.   Yes [provider]  insulin glargine (LANTUS) 100 UNIT/ML injection Inject 25 Units into the skin at bedtime.   Yes [provider]  insulin lispro (HUMALOG) 100 UNIT/ML injection Inject 19 Units into the skin 2 (two) times daily. 12 units before lunch and 7 units before supper   Yes [provider]  losartan-hydrochlorothiazide (HYZAAR) 100-25 MG per tablet Take 1 tablet by mouth daily.   Yes [provider]  traMADol (ULTRAM) 50 MG tablet Take 1 tablet (50 mg total) by mouth every 6 (six) hours as needed. 11/08/13  Yes Junious Silk, PA-C   No Known Allergies Review of Systems  Unable to perform ROS   Physical Exam Pulmonary:     Effort: Pulmonary effort is normal.  Skin:    General: Skin is warm and  dry.  Neurological:     Mental Status: She is alert. She is disoriented.     Comments: Disoriented to situation  Psychiatric:        Mood and Affect: Mood normal.     Vital Signs: BP (!) 159/61 (BP Location: Left Leg)   Pulse 90   Temp 98.2 F (36.8 C) (Oral)   Resp 18   Ht  (1.499 m)   Wt 63.5 kg   SpO2 99%   BMI 28.27 kg/m  Pain Scale: 0-10   Pain Score: 0-No pain   SpO2: SpO2: 99 % O2 Device:SpO2: 99 % O2  Flow Rate: .   IO: Intake/output summary:  Intake/Output Summary (Last 24 hours) at 12/14/2022 1526 Last data filed at 12/14/2022 1155 Gross per 24 hour  Intake 100 ml  Output 0 ml  Net 100 ml    LBM: Last BM Date : 12/12/22 Baseline Weight: Weight: 63.5 kg Most recent weight: Weight: 63.5 kg     Palliative Assessment/Data:    Care plan was discussed with Rajita the bedside RN   Time  Signed by: Sherryll Burger, NP   Please contact Palliative Medicine Team phone at [phone] for questions and concerns.  For individual provider: See Loretha Stapler

## 2022-12-14 NOTE — Progress Notes (Signed)
Subjective:  Pt seen on HD-  she has actually never had HD at [facility]-  dont know where she received HD last week-  I guess at a hospital ?  She cannot answer any questions about this-  says her belly pain is better  Objective Vital signs in last 24 hours: Vitals:   12/14/22 1012 12/14/22 1042 12/14/22 1112 12/14/22 1142  BP: (!) 158/54 (!) 157/62 (!) 176/59   Pulse: 89 91 96   Resp: Temp:      TempSrc:      SpO2: 100% 99% 98%   Weight:      Height:       Weight change:   Intake/Output Summary (Last 24 hours) at 12/14/2022 1150 Last data filed at 12/13/2022 1554 Gross per 24 hour  Intake 200 ml  Output --  Net 200 ml   OP orders that have been entered for [facility]- 3 hours 15 minutes-  BFR 350 TDC-  heparin 1500 units then 750 mid run   Assessment/ Plan: Pt is a 77 y.o. yo female with many medical issues-  ESRD, s/p CVA, on tube feeds, recently relocated here but cannot provide any history who was admitted on 12/12/2022 with abd pain  Assessment/Plan: 1. Abd pain-  surgery involved-  has a PEG-  work up for cholecystitis is ongoing, HIDA pending-  likely a poor candidate for surgery 2. ESRD -  dont know how long has been on HD-  using TDC-  moved from [place]- has not officially started HD at [facility] but was slated to begin this week -  3 hours and 15 minutes will likely not give the clearance that she needs-  not sure why she never had an AVF placed ? Need to determine EDW-- per note-  mostly bed bound-  not sure how she was getting to HD ? I agree with palliative care discussion 3. Anemia-  initial hgb 11.1- now 9.6-  will check iron stores and use ESA as needed 4. Secondary hyperparathyroidism-  phos and calc low-  no binders or vit D noted-  phos low-  will check PTH 5. HTN/volume- no BP meds given here-  according to lost is on 4 meds including HCTZ-  will simplify upon discharge-   first order of business is to get to EDW then she may not need BP meds 6. Nutrition-  alb is not  low-  is a little lower of late -  has PEG-  not sure if has resdual swallowing issue from CVA ?  Pt did not know that she had  Cecille Aver    Labs: Basic Metabolic Panel: Recent Labs  Lab 12/12/22 1917 12/13/22 0146 12/14/22 0501  NA 134* 131* 135  K 3.6 3.3* 3.4*  CL 95* 95* 103  CO2 GLUCOSE 166* 136* 134*  BUN 39* 43* 50*  CREATININE 2.60* 2.76* 3.05*  CALCIUM 9.7 9.1 8.4*  PHOS  --  2.1*  --    Liver Function Tests: Recent Labs  Lab 12/12/22 1917 12/13/22 0146 12/14/22 0501  AST ALT ALKPHOS 63 54 47  BILITOT 0.5 0.4 0.3  PROT 8.1 7.1 6.2*  ALBUMIN 4.2 3.7 3.2*   No results for input(s): "LIPASE", "AMYLASE" in the last 168 hours. No results for input(s): "AMMONIA" in the last 168 hours. CBC: Recent Labs  Lab 12/12/22 1917 12/13/22 0146 12/14/22 0501  WBC 10.1  8.8 6.7  NEUTROABS 6.1 5.2  --   HGB 11.1* 10.1* 9.6*  HCT 35.1* 30.2* 30.1*  MCV 92.9 90.4 94.1  PLT 176 147* 148*   Cardiac Enzymes: No results for input(s): "CKTOTAL", "CKMB", "CKMBINDEX", "TROPONINI" in the last 168 hours. CBG: Recent Labs  Lab 12/13/22 0726 12/13/22 1214 12/13/22 1721 12/14/22 0016 12/14/22 0813  GLUCAP 100* 130* 116* 80 160*    Iron Studies: No results for input(s): "IRON", "TIBC", "TRANSFERRIN", "FERRITIN" in the last 72 hours. Studies/Results: US Abdomen Limited RUQ (LIVER/GB)  Result Date: 12/12/2022 CLINICAL DATA:  Cholelithiasis. EXAM: ULTRASOUND ABDOMEN LIMITED RIGHT UPPER QUADRANT COMPARISON:  Same day CT abdomen pelvis. FINDINGS: Gallbladder: Gallstones are noted, measuring up to 2.7 cm in diameter. No gallbladder wall thickening visualized. A positive sonographic Eulah Pont sign was noted by sonographer. Common bile duct: Diameter: 4 mm Liver: No focal lesion identified. Within normal limits in parenchymal echogenicity. Portal vein is patent on color Doppler imaging with normal direction of blood flow towards the liver.  Other: A 2.0 cm cyst is seen in the upper pole the right kidney. IMPRESSION: Cholelithiasis with a positive sonographic Murphy sign, but no gallbladder wall thickening or pericholecystic fluid. Findings are suggestive of acute cholecystitis. Electronically Signed   By: Romona Curls M.D.   On: 12/12/2022 19:15   CT ABDOMEN PELVIS WO CONTRAST  Result Date: 12/12/2022 CLINICAL DATA:  Nonlocalized abdominal pain EXAM: CT ABDOMEN AND PELVIS WITHOUT CONTRAST TECHNIQUE: Multidetector CT imaging of the abdomen and pelvis was performed following the standard protocol without IV contrast. RADIATION DOSE REDUCTION: This exam was performed according to the departmental dose-optimization program which includes automated exposure control, adjustment of the mA and/or kV according to patient size and/or use of iterative reconstruction technique. COMPARISON:  None Available. FINDINGS: Lower chest: Heart is slightly enlarged. Large-bore catheter seen along the right side of the heart. Breathing motion at the lung bases. Mild basilar scar or atelectasis. Hepatobiliary: Dilated gallbladder with stones including a stone which may be impacted in the neck of the gallbladder. Please correlate for other signs of acute cholecystitis recommend further evaluation such as ultrasound or HIDA scan. Liver is otherwise grossly preserved on this non IV contrast exam. Pancreas: Unremarkable. No pancreatic ductal dilatation or surrounding inflammatory changes. Spleen: Normal in size without focal abnormality. Adrenals/Urinary Tract: The adrenal glands are preserved. Mild bilateral renal atrophy. No abnormal calcification is seen within either kidney nor along the course of either ureter. Preserved contours of the urinary bladder. There is a cystic lesion along the upper pole of the right kidney measuring Hounsfield unit of less than 0 and diameter of 2.4 cm. Smaller focus more lateral as well in the right kidney measuring also Hounsfield unit of  less than 0 and diameter approaching 1.5 cm. Likely both benign cysts. Specific imaging follow-up. Stomach/Bowel: Large bowel has a normal course and caliber with scattered stool. Few left-sided colonic diverticula. Normal retrocecal appendix. Small bowel is nondilated. G-tube in the stomach. Vascular/Lymphatic: IVC filter in place. Normal caliber aorta with slight atherosclerotic calcifications along the aorta and branch vessels. No specific abnormal lymph node enlargement seen in the abdomen and pelvis. Reproductive: Status post hysterectomy. No adnexal masses. Other: No free air or free fluid. Musculoskeletal: Scattered degenerative changes of the spine and pelvis. Trace retrolisthesis at T11 on T12 and anterolisthesis of L3 on L4. Degenerative changes of the pelvis. IMPRESSION: Dilated gallbladder with stones including a stone which may be impacted in the neck of the gallbladder. Acute  cholecystitis is possible. Recommend further workup such as HIDA scan or ultrasound. Scattered colonic diverticula. Normal appendix. No bowel obstruction. IVC filter. Mild bilateral renal atrophy. No obstructing stones. Benign-appearing Bosniak 1 renal cysts. Enlarged heart with a large-bore catheter. Electronically Signed   By: Karen Kays M.D.   On: 12/12/2022 16:17   DG Chest 2 View  Result Date: 12/12/2022 CLINICAL DATA:  Shortness of breath. EXAM: CHEST - 2 VIEW COMPARISON:  None Available. FINDINGS: Heart is enlarged. A right subclavian dialysis catheter is in place. The tip turns medially and may be near the mitral valve. No edema or effusion is present. No focal airspace disease is present. The lung volumes are low. IVC filter is noted. Degenerative changes are present in thoracic spine. Surgical clips are present in the left axilla. IMPRESSION: 1. Cardiomegaly without failure. 2. No acute cardiopulmonary disease. 3. Right subclavian dialysis catheter is in place. Electronically Signed   By: Marin Roberts M.D.    On: 12/12/2022 12:18   Medications: Infusions:  sodium chloride 40 mL/hr at 12/14/22 0730   cefTRIAXone (ROCEPHIN)  IV Stopped (12/13/22 1324)   feeding supplement (NEPRO CARB STEADY) 1,000 mL (12/13/22 2356)   heparin 700 Units/hr (12/14/22 0728)   metronidazole 500 mg (12/13/22 2056)    Scheduled Medications:  Chlorhexidine Gluconate Cloth  6 each Topical Q0600   insulin aspart  0-6 Units Subcutaneous TID WC   insulin glargine-yfgn  10 Units Subcutaneous QHS    have reviewed scheduled and prn medications.  Physical Exam: General: friendly-  nad-  cannot answer simple questions-  denied having a PEG tube Heart: RRR Lungs: mostly clear Abdomen: obese-  PEG tube in left upper quadrant-   Extremities: min edema  Dialysis Access: TDC-  no evidence of previous AVF    12/14/2022,11:50 AM  LOS: 2 days

## 2022-12-14 NOTE — Progress Notes (Signed)
Initial Nutrition Assessment  DOCUMENTATION CODES:   Not applicable  INTERVENTION:   Enteral nutrition via PEG tube: Osmolite 1.5 at 83 mL/hr x 12 hours from 1800 to 0600 Tube feeds at goal provided 1500 kcal, 63 gm protein, and 762 mL free water daily.  Renal Multivitamin w/ minerals daily Meal ordering with assist Assistance with setting up meal tray   NUTRITION DIAGNOSIS:   Increased nutrient needs related to acute illness as evidenced by estimated needs.  GOAL:   Patient will meet greater than or equal to 90% of their needs  MONITOR:   PO intake, Labs, TF tolerance, I & O's  REASON FOR ASSESSMENT:   Consult Enteral/tube feeding initiation and management, Assessment of nutrition requirement/status  ASSESSMENT:   77 y.o. female presented to the ED with abdominal pain. PMH includes ESRD on HD (MWF), FTT w/ PEG tube, CVA w/ L-side residual weakness, T2DM, HTN, and DVT. Pt admitted with abdominal pain and possible cholecystitis.  Pt laying in bed at time of RD visit. Lunch tray had just arrived, RD assisted pt in setting up tray. Pt reports that she typically has three meals per day and will snack on chips as well. Will miss a meal on dialysis days as well. Does not drink any oral nutrition supplement. Reports that she does not use her PEG and is unsure how long she has had it for. No family at bedside and unable to reach daughter at [phone] when RD attempted to call.   Pt unsure of exact dry weight, reports "one hundred something." Denies any changes in weight status.   MD ok with RD adjusting tube feeds as needed.   Medications reviewed and include: NovoLog 0-6 units TID, Semglee, IV antibiotics  Labs reviewed: Sodium 135, Potassium 3.4, Magnesium 2.2, Hgb A1c 7.3%, 24 hr CBGs 80-160  HD on 4/22 EDW: 64 kg Net UF: 0 mL  NUTRITION - FOCUSED PHYSICAL EXAM:  Deferred to follow-up due to pt eating lunch.  Diet Order:   Diet Order             Diet regular  Room service appropriate? Yes; Fluid consistency: Thin  Diet effective now                   EDUCATION NEEDS:   Not appropriate for education at this time  Skin:  Skin Assessment: Reviewed RN Assessment  Last BM:  4/20  Height:  Ht Readings from Last 1 Encounters:  12/12/22  (1.499 m)   Weight:  Wt Readings from Last 1 Encounters:  12/14/22 63.5 kg   BMI:  Body mass index is 28.27 kg/m.  Estimated Nutritional Needs:  Kcal:  1700-1900 Protein:  85-100 grams Fluid:  1L + UOP   Kirby Crigler RD, LDN Clinical Dietitian See [facility] for contact information.

## 2022-12-14 NOTE — Progress Notes (Signed)
  Transition of Care (TOC) Screening Note   Patient Details  Name: Samantha [name] Date of Birth: Apr 12, 1946   Transition of Care [facility]) CM/SW Contact:    Mearl Latin, LCSW Phone Number: 12/14/2022, 8:52 AM    Transition of Care Department [facility]) has reviewed patient from home with chronic PEG feedings and dialysis. We will continue to monitor patient advancement through interdisciplinary progression rounds. If new patient transition needs arise, please place a TOC consult.

## 2022-12-14 NOTE — Plan of Care (Signed)

## 2022-12-14 NOTE — Progress Notes (Signed)
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  On presentation, LFTs were normal, WBC of 2100, lactic acid of 1.  Chest x-ray showed no acute cardiopulmonary process.  CT of abdomen/pelvis showed dilated gallbladder with stones including a stone which may be impacted in the neck of the gallbladder; acute cholecystitis is possible.  Right upper quadrant ultrasound showed findings suggestive of acute cholecystitis.  General surgery was consulted  Assessment & Plan:   Abdominal pain with concern for acute calculus cholecystitis -Imaging as above.  General surgery following and not that was convinced that patient has acute cholecystitis. -HIDA scan still pending further evaluation of acute cholecystitis diagnosis. -Continue with diet per general surgery recommendation -Start Rocephin and Flagyl.  End-stage renal disease on hemodialysis -Nephrology has been consulted.  HD per renal  Failure to thrive History of ischemic CVA with residual left-sided weakness Dysphagia requiring PEG tube feedings Physical deconditioning -Dietitian consulted to resume PEG tube feeding.  Patient still is able to eat by mouth but oral intake is poor. -Consult palliative care for goals of care discussion -PT/OT consulted  History of DVT with chronic anticoagulation -Eliquis on hold.  Currently on heparin drip.  Diabetes mellitus type 2  -Continue long-acting insulin along with CBGs with SSI  Hyponatremia -mild.  Managed by hemodialysis.  Hypokalemia -Mild.  Monitor  Anemia of chronic disease -From chronic renal failure.  Hemoglobin currently stable.  Monitor  intermittently  Thrombocytopenia -Mild.  Monitor intermittently.  No signs of bleeding  Essential hypertension -Blood pressure on the lower side.  Amlodipine, Coreg, losartan, hydrochlorothiazide on hold    DVT prophylaxis: Heparin drip Code Status: Full Family Communication: none at bedside Disposition Plan: Status is: Inpatient Remains inpatient appropriate because: Of severity of illness    Consultants: General surgery.  Renal,  palliative  care  Procedures: None  Antimicrobials: None currently   Subjective:  No significant events overnight as discussed with staff, she denies any complaints this morning.  Objective: Vitals:   12/14/22 1153 12/14/22 1155 12/14/22 1157 12/14/22 1250  BP:  (!) 164/52 (!) 160/61 (!) 159/61  Pulse:  83 83 90  Resp:  18 18 18   Temp:  98.5 F (36.9 C)  98.2 F (36.8 C)  TempSrc:  Oral  Oral  SpO2:  99% 99% 99%  Weight: 63.5 kg     Height:        Intake/Output Summary (Last 24 hours) at 12/14/2022 1446 Last data filed at 12/14/2022 1155 Gross per 24 hour  Intake 100 ml  Output 0 ml  Net 100 ml   Filed Weights   12/12/22 1134 12/14/22 0841 12/14/22 1153  Weight: 63.5 kg 63.5 kg 63.5 kg    Examination:  Awake Alert, pleasant, no apparent distress, chronically ill-appearing, deconditioned Symmetrical Chest wall movement, Good air movement bilaterally, CTAB RRR,No Gallops,Rubs or new Murmurs, No Parasternal Heave +ve B.Sounds, Abd Soft, No tenderness PEG present No Cyanosis, Clubbing or edema, No new Rash or bruise, chronic left-sided weakness    Data Reviewed: I have personally reviewed following labs and imaging studies  CBC: Recent Labs  Lab 12/12/22 1917 12/13/22 0146 12/14/22 0501  WBC 10.1 8.8 6.7  NEUTROABS 6.1  5.2  --   HGB 11.1* 10.1* 9.6*  HCT 35.1* 30.2* 30.1*  MCV 92.9 90.4 94.1  PLT 176 147* 148*   Basic Metabolic Panel: Recent Labs  Lab 12/12/22 1917 12/13/22 0146 12/14/22 0501  NA 134* 131*  135  K 3.6 3.3* 3.4*  CL 95* 95* 103  CO2 GLUCOSE 166* 136* 134*  BUN 39* 43* 50*  CREATININE 2.60* 2.76* 3.05*  CALCIUM 9.7 9.1 8.4*  MG 2.4 2.2 2.2  PHOS  --  2.1*  --    GFR: Estimated Creatinine Clearance: 12.7 mL/min (A) (by C-G formula based on SCr of 3.05 mg/dL (H)). Liver Function Tests: Recent Labs  Lab 12/12/22 1917 12/13/22 0146 12/14/22 0501  AST ALT ALKPHOS 63 54 47  BILITOT 0.5 0.4 0.3  PROT 8.1 7.1 6.2*  ALBUMIN 4.2 3.7 3.2*   No results for input(s): "LIPASE", "AMYLASE" in the last 168 hours. No results for input(s): "AMMONIA" in the last 168 hours. Coagulation Profile: Recent Labs  Lab 12/13/22 0146  INR 1.5*   Cardiac Enzymes: No results for input(s): "CKTOTAL", "CKMB", "CKMBINDEX", "TROPONINI" in the last 168 hours. BNP (last 3 results) No results for input(s): "PROBNP" in the last 8760 hours. HbA1C: Recent Labs    12/13/22 1922  HGBA1C 7.3*   CBG: Recent Labs  Lab 12/13/22 1214 12/13/22 1721 12/14/22 0016 12/14/22 0813 12/14/22 1240  GLUCAP 130* 116* 80 160* 138*   Lipid Profile: No results for input(s): "CHOL", "HDL", "LDLCALC", "TRIG", "CHOLHDL", "LDLDIRECT" in the last 72 hours. Thyroid Function Tests: No results for input(s): "TSH", "T4TOTAL", "FREET4", "T3FREE", "THYROIDAB" in the last 72 hours. Anemia Panel: No results for input(s): "VITAMINB12", "FOLATE", "FERRITIN", "TIBC", "IRON", "RETICCTPCT" in the last 72 hours. Sepsis Labs: Recent Labs  Lab 12/13/22 0146  LATICACIDVEN 1.0    No results found for this or any previous visit (from the past 240 hour(s)).       Radiology Studies: US Abdomen Limited RUQ (LIVER/GB)  Result Date: 12/12/2022 CLINICAL DATA:  Cholelithiasis. EXAM: ULTRASOUND ABDOMEN LIMITED RIGHT UPPER QUADRANT COMPARISON:  Same day CT abdomen pelvis. FINDINGS: Gallbladder: Gallstones are noted, measuring up to 2.7 cm in diameter. No gallbladder wall thickening visualized.  A positive sonographic Eulah Pont sign was noted by sonographer. Common bile duct: Diameter: 4 mm Liver: No focal lesion identified. Within normal limits in parenchymal echogenicity. Portal vein is patent on color Doppler imaging with normal direction of blood flow towards the liver. Other: A 2.0 cm cyst is seen in the upper pole the right kidney. IMPRESSION: Cholelithiasis with a positive sonographic Murphy sign, but no gallbladder wall thickening or pericholecystic fluid. Findings are suggestive of acute cholecystitis. Electronically Signed   By: Romona Curls M.D.   On: 12/12/2022 19:15   CT ABDOMEN PELVIS WO CONTRAST  Result Date: 12/12/2022 CLINICAL DATA:  Nonlocalized abdominal pain EXAM: CT ABDOMEN AND PELVIS WITHOUT CONTRAST TECHNIQUE: Multidetector CT imaging of the abdomen and pelvis was performed following the standard protocol without IV contrast. RADIATION DOSE REDUCTION: This exam was performed according to the departmental dose-optimization program which includes automated exposure control, adjustment of the mA and/or kV according to patient size and/or use of iterative reconstruction technique. COMPARISON:  None Available. FINDINGS: Lower chest: Heart is slightly enlarged. Large-bore catheter seen along the right side of the heart. Breathing motion at the lung bases. Mild basilar scar or atelectasis. Hepatobiliary: Dilated gallbladder with stones including a stone which  may be impacted in the neck of the gallbladder. Please correlate for other signs of acute cholecystitis recommend further evaluation such as ultrasound or HIDA scan. Liver is otherwise grossly preserved on this non IV contrast exam. Pancreas: Unremarkable. No pancreatic ductal dilatation or surrounding inflammatory changes. Spleen: Normal in size without focal abnormality. Adrenals/Urinary Tract: The adrenal glands are preserved. Mild bilateral renal atrophy. No abnormal calcification is seen within either kidney nor along the course  of either ureter. Preserved contours of the urinary bladder. There is a cystic lesion along the upper pole of the right kidney measuring Hounsfield unit of less than 0 and diameter of 2.4 cm. Smaller focus more lateral as well in the right kidney measuring also Hounsfield unit of less than 0 and diameter approaching 1.5 cm. Likely both benign cysts. Specific imaging follow-up. Stomach/Bowel: Large bowel has a normal course and caliber with scattered stool. Few left-sided colonic diverticula. Normal retrocecal appendix. Small bowel is nondilated. G-tube in the stomach. Vascular/Lymphatic: IVC filter in place. Normal caliber aorta with slight atherosclerotic calcifications along the aorta and branch vessels. No specific abnormal lymph node enlargement seen in the abdomen and pelvis. Reproductive: Status post hysterectomy. No adnexal masses. Other: No free air or free fluid. Musculoskeletal: Scattered degenerative changes of the spine and pelvis. Trace retrolisthesis at T11 on T12 and anterolisthesis of L3 on L4. Degenerative changes of the pelvis. IMPRESSION: Dilated gallbladder with stones including a stone which may be impacted in the neck of the gallbladder. Acute cholecystitis is possible. Recommend further workup such as HIDA scan or ultrasound. Scattered colonic diverticula. Normal appendix. No bowel obstruction. IVC filter. Mild bilateral renal atrophy. No obstructing stones. Benign-appearing Bosniak 1 renal cysts. Enlarged heart with a large-bore catheter. Electronically Signed   By: Karen Kays M.D.   On: 12/12/2022 16:17        Scheduled Meds:  Chlorhexidine Gluconate Cloth  6 each Topical Q0600   [START ON 12/16/2022] darbepoetin (ARANESP) injection - DIALYSIS  60 mcg Subcutaneous Q Wed-1800   insulin aspart  0-6 Units Subcutaneous TID WC   insulin glargine-yfgn  10 Units Subcutaneous QHS   Continuous Infusions:  sodium chloride 40 mL/hr at 12/14/22 0730   cefTRIAXone (ROCEPHIN)  IV 2 g  (12/14/22 1238)   feeding supplement (NEPRO CARB STEADY) 1,000 mL (12/13/22 2356)   heparin 700 Units/hr (12/14/22 1259)   metronidazole 500 mg (12/14/22 1229)          Huey Bienenstock, MD [facility] 12/14/2022, 2:46 PM

## 2022-12-14 NOTE — Progress Notes (Signed)
   12/14/22 1440  Provider Notification  Date Provider Notified 12/14/22  Time Provider Notified 1440  Method of Notification Face-to-face  Notification Reason Critical Result  Test performed and critical result [id number]  Date Critical Result Received 12/14/22  Time Critical Result Received 1435  Provider response See new orders  Date of Provider Response 12/14/22  Time of Provider Response 1440

## 2022-12-14 NOTE — Progress Notes (Signed)
ANTICOAGULATION CONSULT NOTE - Follow-up Consult  Pharmacy Consult for IV heparin Indication: atrial fibrillation  No Known Allergies  Patient Measurements: Height:  (149.9 cm) Weight: 63.5 kg (139 lb 15.9 oz) IBW/kg (Calculated) : 43.2 Heparin Dosing Weight: 56 kg  Vital Signs: Temp: 98.2 F (36.8 C) (04/22 1250) Temp Source: Oral (04/22 1250) BP: 159/61 (04/22 1250) Pulse Rate: 90 (04/22 1250)  Labs: Recent Labs    12/12/22 1917 12/13/22 0146 12/13/22 0845 12/13/22 1103 12/13/22 2100 12/13/22 2319 12/14/22 0501 12/14/22 1435  HGB 11.1* 10.1*  --   --   --   --  9.6*  --   HCT 35.1* 30.2*  --   --   --   --  30.1*  --   PLT 176 147*  --   --   --   --  148*  --   APTT  --   --  118*   < > 141* >200* 29 196*  LABPROT  --  18.1*  --   --   --   --   --   --   INR  --  1.5*  --   --   --   --   --   --   HEPARINUNFRC  --   --  >1.10*  --  >1.10*  --  >1.10*  --   CREATININE 2.60* 2.76*  --   --   --   --  3.05*  --    < > = values in this interval not displayed.     Estimated Creatinine Clearance: 12.7 mL/min (A) (by C-G formula based on SCr of 3.05 mg/dL (H)).   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  Last dose taken 4/19 in the PM per patient report.  Pharmacy asked to start IV heparin while Eliquis on hold for possible surgical need.   APTT elevated from footstick to >  200.  Confirmatory level took several hours to draw, now down to 29 off of heparin.  No overt bleeding or complications noted.  4/22 PM update: aPTT: 196 seconds- confirmed footstick with phlebotomist No signs of bleeding or issues with the heparin infusion per bedside nurse  Goal of Therapy:  Heparin level 0.3-0.7 units/ml aPTT 66-102 seconds Monitor platelets by anticoagulation protocol: Yes   Plan:  Hold heparin infusion x1 hour Restart IV heparin at 500 units/hr at 1700 Check aPTT in 8 hrs - would continue to collect via footstick. Daily heparin level and CBC.  Greta Doom BS, PharmD, BCPS Clinical Pharmacist 12/14/2022 3:45 PM  Contact: [phone] after 3 PM  "Be curious, not judgmental..." -Debbora Dus

## 2022-12-14 NOTE — Progress Notes (Signed)
Received patient in bed to unit.  Alert and oriented.  Informed consent signed and in chart.   TX duration: 3hrs  Patient tolerated well.  Transported back to the room. Alert, without acute distress.  Hand-off given to patient's nurse.   Access used: R IJ Access issues: None  Total UF removed: 0ml Medication(s) given: None  Post HD weight: 63.5kg   12/14/22 1155  Vitals  Temp 98.5 F (36.9 C)  Temp Source Oral  BP (!) 164/52  MAP (mmHg) 83  BP Location Left Leg  BP Method Automatic  Patient Position (if appropriate) Lying  Pulse Rate 83  Pulse Rate Source Monitor  ECG Heart Rate 83  Resp 18  Oxygen Therapy  SpO2 99 %  O2 Device Room Air  During Treatment Monitoring  Intra-Hemodialysis Comments Tx completed;Tolerated well  Post Treatment  Dialyzer Clearance Lightly streaked  Duration of HD Treatment -hour(s) 3 hour(s)  Liters Processed 63  Fluid Removed (mL) 0 mL  Tolerated HD Treatment Yes  Hemodialysis Catheter Right Internal jugular  No placement date or time found.   Placed prior to admission: Yes  Orientation: Right  Access Location: Internal jugular  Site Condition No complications  Blue Lumen Status Heparin locked;Dead end cap in place  Red Lumen Status Dead end cap in place;Heparin locked  Purple Lumen Status N/A  Catheter fill solution Heparin 1000 units/ml  Catheter fill volume (Arterial) 2.2 cc  Catheter fill volume (Venous) 2.3  Dressing Type Transparent  Dressing Status Antimicrobial disc in place;Clean, Dry, Intact  Interventions New dressing  Drainage Description None  Dressing Change Due 12/21/22  Post treatment catheter status Capped and Clamped    [facility]

## 2022-12-14 NOTE — Progress Notes (Signed)
ANTICOAGULATION CONSULT NOTE - Follow-up Consult  Pharmacy Consult for IV heparin Indication: atrial fibrillation  No Known Allergies  Patient Measurements: Height:  (149.9 cm) Weight: 63.5 kg (140 lb) IBW/kg (Calculated) : 43.2 Heparin Dosing Weight: 56 kg  Vital Signs: Temp: 97.6 F (36.4 C) (04/22 0400) Temp Source: Oral (04/22 0400) BP: 167/49 (04/22 0400) Pulse Rate: 77 (04/22 0400)  Labs: Recent Labs    12/12/22 1917 12/13/22 0146 12/13/22 0845 12/13/22 1103 12/13/22 2100 12/13/22 2319 12/14/22 0501  HGB 11.1* 10.1*  --   --   --   --   --   HCT 35.1* 30.2*  --   --   --   --   --   PLT 176 147*  --   --   --   --   --   APTT  --   --  118*   < > 141* >200* 29  LABPROT  --  18.1*  --   --   --   --   --   INR  --  1.5*  --   --   --   --   --   HEPARINUNFRC  --   --  >1.10*  --  >1.10*  --  >1.10*  CREATININE 2.60* 2.76*  --   --   --   --  3.05*   < > = values in this interval not displayed.     Estimated Creatinine Clearance: 12.7 mL/min (A) (by C-G formula based on SCr of 3.05 mg/dL (H)).   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  Last dose taken 4/19 in the PM per patient report.  Pharmacy asked to start IV heparin while Eliquis on hold for possible surgical need.   APTT  elevated from footstick to > 200.  Confirmatory level took several hours to draw, now down to 29 off of heparin.  No overt bleeding or complications noted.   Goal of Therapy:  Heparin level 0.3-0.7 units/ml aPTT 66-102 seconds Monitor platelets by anticoagulation protocol: Yes   Plan:  Resume IV heparin at 700 units/hr Check aPTT in 8 hrs - would continue to collect via footstick. Daily heparin level and CBC.  Reece Leader, Loura Back, BCPS, BCCP Clinical Pharmacist  12/14/2022 6:12 AM   [facility] pharmacy phone numbers are listed on [email or web address]

## 2022-12-15 ENCOUNTER — Inpatient Hospital Stay (HOSPITAL_COMMUNITY): Payer: 59

## 2022-12-15 DIAGNOSIS — Z86718 Personal history of other venous thrombosis and embolism: Secondary | ICD-10-CM | POA: Diagnosis not present

## 2022-12-15 DIAGNOSIS — Z992 Dependence on renal dialysis: Secondary | ICD-10-CM | POA: Diagnosis not present

## 2022-12-15 DIAGNOSIS — Z7189 Other specified counseling: Secondary | ICD-10-CM

## 2022-12-15 DIAGNOSIS — N186 End stage renal disease: Secondary | ICD-10-CM | POA: Diagnosis not present

## 2022-12-15 DIAGNOSIS — K810 Acute cholecystitis: Secondary | ICD-10-CM | POA: Diagnosis not present

## 2022-12-15 LAB — CBC
HCT: 30.6 % — ABNORMAL LOW (ref 36.0–46.0)
Hemoglobin: 10.3 g/dL — ABNORMAL LOW (ref 12.0–15.0)
MCH: 30.7 pg (ref 26.0–34.0)
MCHC: 33.7 g/dL (ref 30.0–36.0)
MCV: 91.3 fL (ref 80.0–100.0)
Platelets: 163 10*3/uL (ref 150–400)
RBC: 3.35 MIL/uL — ABNORMAL LOW (ref 3.87–5.11)
RDW: 18.6 % — ABNORMAL HIGH (ref 11.5–15.5)
WBC: 9.2 10*3/uL (ref 4.0–10.5)
nRBC: 0 % (ref 0.0–0.2)

## 2022-12-15 LAB — GLUCOSE, CAPILLARY
Glucose-Capillary: 148 mg/dL — ABNORMAL HIGH (ref 70–99)
Glucose-Capillary: 150 mg/dL — ABNORMAL HIGH (ref 70–99)
Glucose-Capillary: 169 mg/dL — ABNORMAL HIGH (ref 70–99)
Glucose-Capillary: 214 mg/dL — ABNORMAL HIGH (ref 70–99)
Glucose-Capillary: 158 mg/dL — ABNORMAL HIGH (ref 70–99)

## 2022-12-15 LAB — PHOSPHORUS: Phosphorus: <1 mg/dL — CL (ref 2.5–4.6)

## 2022-12-15 LAB — HEPARIN LEVEL (UNFRACTIONATED)
Heparin Unfractionated: 0.9 IU/mL — ABNORMAL HIGH (ref 0.30–0.70)
Heparin Unfractionated: 0.79 IU/mL — ABNORMAL HIGH (ref 0.30–0.70)

## 2022-12-15 LAB — MAGNESIUM: Magnesium: 1.8 mg/dL (ref 1.7–2.4)

## 2022-12-15 LAB — FERRITIN: Ferritin: 3281 ng/mL — ABNORMAL HIGH (ref 11–307)

## 2022-12-15 LAB — IRON AND TIBC
Iron: 177 ug/dL — ABNORMAL HIGH (ref 28–170)
Saturation Ratios: 90 % — ABNORMAL HIGH (ref 10.4–31.8)
TIBC: 197 ug/dL — ABNORMAL LOW (ref 250–450)
UIBC: 20 ug/dL

## 2022-12-15 LAB — RENAL FUNCTION PANEL
Albumin: 3.6 g/dL (ref 3.5–5.0)
Anion gap: 12 (ref 5–15)
BUN: 20 mg/dL (ref 8–23)
CO2: 25 mmol/L (ref 22–32)
Calcium: 8.6 mg/dL — ABNORMAL LOW (ref 8.9–10.3)
Chloride: 102 mmol/L (ref 98–111)
Creatinine, Ser: 2.05 mg/dL — ABNORMAL HIGH (ref 0.44–1.00)
GFR, Estimated: 25 mL/min — ABNORMAL LOW (ref >60)
Glucose, Bld: 147 mg/dL — ABNORMAL HIGH (ref 70–99)
Phosphorus: 3.1 mg/dL (ref 2.5–4.6)
Potassium: 3.6 mmol/L (ref 3.5–5.1)
Sodium: 139 mmol/L (ref 135–145)

## 2022-12-15 LAB — HEPATITIS B SURFACE ANTIBODY, QUANTITATIVE: Hep B S AB Quant (Post): 43.5 m[IU]/mL (ref >9.9)

## 2022-12-15 LAB — APTT
aPTT: 40 seconds — ABNORMAL HIGH (ref 24–36)
aPTT: 53 seconds — ABNORMAL HIGH (ref 24–36)
aPTT: 66 seconds — ABNORMAL HIGH (ref 24–36)

## 2022-12-15 MED ORDER — POTASSIUM PHOSPHATES 15 MMOLE/5ML IV SOLN
30.0000 mmol | INTRAVENOUS | Status: DC
  Administered 2022-12-15: 30 mmol via INTRAVENOUS
  Filled 2022-12-15 (×2): qty 10

## 2022-12-15 MED ORDER — SODIUM PHOSPHATES 45 MMOLE/15ML IV SOLN: 30.0000 mmol | Freq: Once | INTRAVENOUS | Status: DC

## 2022-12-15 MED ORDER — K PHOS MONO-SOD PHOS DI & MONO 155-852-130 MG PO TABS
250.0000 mg | ORAL_TABLET | Freq: Once | ORAL | Status: AC
  Administered 2022-12-15: 250 mg
  Filled 2022-12-15: qty 1

## 2022-12-15 MED ORDER — TECHNETIUM TC 99M MEBROFENIN IV KIT
5.4000 | PACK | Freq: Once | INTRAVENOUS | Status: AC | PRN
  Administered 2022-12-15: 5.4 via INTRAVENOUS

## 2022-12-15 MED ORDER — MORPHINE SULFATE (PF) 4 MG/ML IV SOLN
2.5000 mg | Freq: Once | INTRAVENOUS | Status: AC
  Administered 2022-12-15: 2.5 mg via INTRAVENOUS
  Filled 2022-12-15: qty 1

## 2022-12-15 NOTE — Progress Notes (Signed)
Got verbal order with read back for Morphine 2.5mg  IV one time dose for HIDA scan from Dr. Pecolia Ades.Cosign required

## 2022-12-15 NOTE — Progress Notes (Signed)
Daily Progress Note   Patient Name: Edgar Goettl       Date: 12/15/2022 DOB: 11-Dec-1945  Age: 77 y.o. MRN#: [id number] Attending Physician: Starleen Arms, MD Primary Care Physician: Patient, No Pcp Per Admit Date: 12/12/2022  Reason for Consultation/Follow-up: Establishing goals of care  Subjective: Patient was sitting in bed watching television. She says she feels good except a "medium pain" in her stomach.    Length of Stay: 3  Current Medications: Scheduled Meds:  . Chlorhexidine Gluconate Cloth  6 each Topical Q0600  . [START ON 12/16/2022] darbepoetin (ARANESP) injection - DIALYSIS  60 mcg Subcutaneous Q Wed-1800  . feeding supplement (OSMOLITE 1.5 CAL)  1,000 mL Per Tube Q24H  . free water  40 mL Per Tube Q2H  . insulin aspart  0-6 Units Subcutaneous TID WC  . insulin glargine-yfgn  10 Units Subcutaneous QHS  . multivitamin  1 tablet Oral QHS    Continuous Infusions: . cefTRIAXone (ROCEPHIN)  IV 2 g (12/15/22 1201)  . heparin 750 Units/hr (12/15/22 1358)  . metronidazole 500 mg (12/15/22 0719)    PRN Meds: acetaminophen **OR** acetaminophen, fentaNYL (SUBLIMAZE) injection, melatonin, naLOXone (NARCAN)  injection, ondansetron (ZOFRAN) IV  Physical Exam Pulmonary:     Effort: Pulmonary effort is normal.  Neurological:     Mental Status: She is alert.  Psychiatric:        Mood and Affect: Mood normal.             Vital Signs: BP (!) 159/66 (BP Location: Left Leg)   Pulse 92   Temp 97.9 F (36.6 C) (Oral)   Resp 18   Ht 4\' 11"  (1.499 m)   Wt 63.3 kg   SpO2 94%   BMI 28.19 kg/m  SpO2: SpO2: 94 % O2 Device: O2 Device: Room Air O2 Flow Rate:    Intake/output summary:  Intake/Output Summary (Last 24 hours) at 12/15/2022 1411 Last data filed at 12/15/2022  0500 Gross per 24 hour  Intake 50 ml  Output 600 ml  Net -550 ml   LBM: Last BM Date : 12/15/22 Baseline Weight: Weight: 63.5 kg Most recent weight: Weight: 63.3 kg       Palliative Assessment/Data: 30%      Patient Active Problem List   Diagnosis Date Noted  . Epigastric pain 12/13/2022  .  End-stage renal disease on hemodialysis 12/13/2022  . Failure to thrive in adult 12/13/2022  . DM2 (diabetes mellitus, type 2) 12/13/2022  . Essential hypertension 12/13/2022  . History of DVT (deep vein thrombosis) 12/13/2022  . CVA (cerebral vascular accident) 12/12/2022  . Acute cholecystitis 12/12/2022    Palliative Care Assessment & Plan   Patient Profile: 77 y.o. 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 CT of abdomen/pelvis showed dilated gallbladder with stones including a stone which may be impacted in the neck of the gallbladder; acute cholecystitis is possible. Right upper quadrant ultrasound showed findings suggestive of acute cholecystitis. General surgery was consulted. HIDA scan completed today awaiting results.  Assessment: Family meeting by phone with all 4 of the patient's daughters to discuss goals of care.   Social History: Patient just moved to [place] the day she was admitted to the hospital to live with her daughter [name] and be closer to family.   Functional and Nutritional State: The patient is bed bound. The family uses a hoyer lift to move her at home. She is able to move her arms and legs a little. In [place] she was receiving PT and OT in a facility. Her daughters said she participated in "bed therapy." The patient has a PEG tube. The patients daughter states she eats a little by mouth as well.  Advanced Directives: The patient  had advanced directives in another state. The daughters work together to make decisions for their mother with her goals in mind.  Code Status: Confirmed that the patient is a FULL Code.  Discussion: The patient's daughters are very concerned with anticipatory care needs. They are hopeful that their mother will be able to walk again. They believe she could attain this goal with intensive therapy. We discussed hoping for the best but preparing for the worst. We are unsure the patient would be eligible for rehab. We discussed potential discharge barriers such as the patient's chronic deconditioning and immobility. The patient  currently has a dialysis chair but does not have transportation to dialysis.   Discussed the importance of continued conversation with family and the medical providers regarding overall plan of care and treatment options, ensuring decisions are within the context of the patient's values and GOC. Family will need continued support as they navigate this difficult situation.  Questions and concerns were addressed. The family was encouraged to call with questions or concerns.  Recommendations/Plan: FULL Code Full Scope of Care Continued conversation with family around goals of care' Referral for PT, OT, and SLP evaluation    Code Status:    Code Status Orders  (From admission, onward)           Start     Ordered   12/12/22 2115  Full code  Continuous       Question:  By:  Answer:  Consent: discussion documented in EHR   12/12/22 2115           Code Status History     This patient has a current code status but no historical code status.       Prognosis:  Unable to determine  Discharge Planning: To Be Determined  Time 75 min  Care plan was discussed with Dr. Randol Kern, Matthew Folks, and bedside RN  Thank you for  allowing the Palliative Medicine Team to assist in the care of this patient.   Sherryll Burger, NP  Please contact Palliative Medicine  Team phone at [phone] for questions and concerns.

## 2022-12-15 NOTE — Evaluation (Signed)
Physical Therapy Evaluation Patient Details Name: Samantha [name] MRN: [id number] DOB: 1945-09-02 Today's Date: 12/15/2022  History of Present Illness  77 y.o. female admitted on 4/20 with abdominal pain and concern for acute cholecystitis.   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  Clinical Impression  Pt presents today with impaired functional mobility, limited by cognition, strength, and balance. Pt is a poor historian, difficult to fully know her PLOF as no family present during session, pt changing her story between walking to use of wheelchair to not being out of bed in a while. Pt required maxAx2 for all mobility today, performing rolling and supine<>sit, with maxA to maintain balance while seated EOB. Acute PT will follow up with pt to progress balance, strength, and overall mobility, recommend subacute PT upon discharge for full time care. Acute PT will follow as appropriate during admission.        Recommendations for follow up therapy are one component of a multi-disciplinary discharge planning process, led by the attending physician.  Recommendations may be updated based on patient status, additional functional criteria and insurance authorization.  Follow Up Recommendations Can patient physically be transported by private vehicle: No     Assistance Recommended at Discharge Frequent or constant Supervision/Assistance  Patient can return home with the following  Two people to help with walking and/or transfers;Assist for transportation;A lot of help with bathing/dressing/bathroom    Equipment Recommendations Other (comment) (defer to next level)  Recommendations for Other Services       Functional Status Assessment Patient has had a recent decline in their functional status and/or demonstrates limited ability to make significant improvements in function  in a reasonable and predictable amount of time     Precautions / Restrictions Precautions Precautions: Fall Restrictions Weight Bearing Restrictions: No      Mobility  Bed Mobility Overal bed mobility: Needs Assistance Bed Mobility: Rolling, Supine to Sit, Sit to Supine Rolling: Max assist, +2 for physical assistance   Supine to sit: Max assist, +2 for physical assistance, HOB elevated Sit to supine: Max assist, +2 for physical assistance, HOB elevated   General bed mobility comments: maxAx2 for rolling, increased practice due to pt soiled and for peri-care. MaxAx2 for bed mobility for trunk and BLE management    Transfers                   General transfer comment: deferred, pt requiring maxA to balance EOB    Ambulation/Gait               General Gait Details: deferred  Stairs            Wheelchair Mobility    Modified Rankin (Stroke Patients Only)       Balance Overall balance assessment: Needs assistance Sitting-balance support: Bilateral upper extremity supported, Feet supported Sitting balance-Leahy Scale: Poor Sitting balance - Comments: requiring external support for sitting balance Postural control: Right lateral lean                                   Pertinent Vitals/Pain Pain Assessment Pain Assessment: Faces Faces Pain Scale: No hurt    Home Living Family/patient expects to be discharged to:: Private residence Living Arrangements: Alone Available Help at Discharge: Family;Available PRN/intermittently Type of Home: Apartment Home Access: Elevator  Home Layout: One level Home Equipment: Agricultural consultant (2 wheels);Cane - single point;BSC/3in1;Shower seat;Wheelchair - manual Additional Comments: Pt is a questionable historian, home living is per pt report as no family able to verify or provide history at this time    Prior Function Prior Level of Function : Patient poor historian/Family not available              Mobility Comments: pt initially reports ambulating with RW in her home and SPC outside, later states she uses a wheelchair, then later reports not standing "in a while". Pt does report she drives and states she may have had a few falls       Hand Dominance   Dominant Hand: Left    Extremity/Trunk Assessment   Upper Extremity Assessment Upper Extremity Assessment: Defer to OT evaluation    Lower Extremity Assessment Lower Extremity Assessment: Generalized weakness (difficult achieving full movement against gravity while seated EOB for hip flexion and knee extension, unable to achieve neutral ankle DF on BLE with PROM)    Cervical / Trunk Assessment Cervical / Trunk Assessment: Kyphotic (right cervical rotation)  Communication   Communication: No difficulties  Cognition Arousal/Alertness: Awake/alert Behavior During Therapy: WFL for tasks assessed/performed Overall Cognitive Status: No family/caregiver present to determine baseline cognitive functioning                                 General Comments: Pt oriented to self and time, reports place as salon but able to [facility] with choices. Pt requiring increased time and cueing for command following        General Comments General comments (skin integrity, edema, etc.): VSS on room air    Exercises     Assessment/Plan    PT Assessment Patient needs continued PT services  PT Problem List Decreased strength;Decreased range of motion;Decreased activity tolerance;Decreased balance;Decreased mobility;Decreased coordination;Decreased cognition;Decreased knowledge of use of DME;Decreased safety awareness;Decreased knowledge of precautions       PT Treatment Interventions DME instruction;Gait training;Functional mobility training;Therapeutic activities;Therapeutic exercise;Balance training;Neuromuscular re-education;Cognitive remediation;Patient/family education;Wheelchair mobility training    PT  Goals (Current goals can be found in the Care Plan section)  Acute Rehab PT Goals Patient Stated Goal: get better PT Goal Formulation: With patient Time For Goal Achievement: 12/29/22 Potential to Achieve Goals: Fair    Frequency Min 2X/week     Co-evaluation               AM-PAC PT "6 Clicks" Mobility  Outcome Measure Help needed turning from your back to your side while in a flat bed without using bedrails?: Total Help needed moving from lying on your back to sitting on the side of a flat bed without using bedrails?: Total Help needed moving to and from a bed to a chair (including a wheelchair)?: Total Help needed standing up from a chair using your arms (e.g., wheelchair or bedside chair)?: Total Help needed to walk in hospital room?: Total Help needed climbing 3-5 steps with a railing? : Total 6 Click Score: 6    End of Session   Activity Tolerance: Patient tolerated treatment well Patient left: in bed;with call bell/phone within reach;with bed alarm set Nurse Communication: Mobility status PT Visit Diagnosis: Other abnormalities of gait and mobility (R26.89);Muscle weakness (generalized) (M62.81)    Time: 8119-1478 PT Time Calculation (min) (ACUTE ONLY): 33 min   Charges:   PT Evaluation $PT Eval Moderate Complexity: 1 Mod  Samantha [name], PT DPT Acute Rehabilitation Services Office [phone]   Samantha [name] 12/15/2022, 3:54 PM

## 2022-12-15 NOTE — Progress Notes (Signed)
ANTICOAGULATION CONSULT NOTE - Follow-up Consult  Pharmacy Consult for IV heparin Indication: atrial fibrillation  No Known Allergies  Patient Measurements: Height:  (149.9 cm) Weight: 63.5 kg (139 lb 15.9 oz) IBW/kg (Calculated) : 43.2 Heparin Dosing Weight: 56 kg  Vital Signs: Temp: 99 F (37.2 C) (04/23 0000) Temp Source: Oral (04/22 2201) BP: 151/47 (04/23 0000) Pulse Rate: 90 (04/23 0000)  Labs: Recent Labs    12/12/22 1917 12/13/22 0146 12/13/22 0845 12/13/22 2100 12/13/22 2319 12/14/22 0501 12/14/22 1435 12/15/22 0124  HGB 11.1* 10.1*  --   --   --  9.6*  --   --   HCT 35.1* 30.2*  --   --   --  30.1*  --   --   PLT 176 147*  --   --   --  148*  --   --   APTT  --   --    < > 141*   < > 29 196* 40*  LABPROT  --  18.1*  --   --   --   --   --   --   INR  --  1.5*  --   --   --   --   --   --   HEPARINUNFRC  --   --    < > >1.10*  --  >1.10*  --  0.90*  CREATININE 2.60* 2.76*  --   --   --  3.05*  --   --    < > = values in this interval not displayed.     Estimated Creatinine Clearance: 12.7 mL/min (A) (by C-G formula based on SCr of 3.05 mg/dL (H)).   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  Last dose taken 4/19 in the PM per patient report.  Pharmacy asked to start IV heparin while Eliquis on hold for  possible surgical need.   APTT elevated from footstick to > 200.  Confirmatory level took several hours to draw, now down to 29 off of heparin.  No overt bleeding or complications noted.  4/23 AM update:  aPTT low at 40  Goal of Therapy:  Heparin level 0.3-0.7 units/ml aPTT 66-102 seconds Monitor platelets by anticoagulation protocol: Yes   Plan:  Inc heparin to 600 units/hr 1200 heparin level and aPTT  Abran Duke, PharmD, BCPS Clinical Pharmacist Phone: [phone]

## 2022-12-15 NOTE — Evaluation (Signed)
Occupational Therapy Evaluation Patient Details Name: Samantha [name] MRN: [id number] DOB: September 09, 1945 Today's Date: 12/15/2022   History of Present Illness 77 y.o. female admitted on 4/20 with abdominal pain and concern for acute cholecystitis.   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   Clinical Impression   Pt seen today for acute skilled OT evaluation. Pt agreeable to participation and pleasant throughout session. Pt is a poor historian and it is difficult to determine her baseline PLOF function with ADLs, functional transfers, and functional mobility. No family/caregiver available this day for additional information regarding PLOF. Pt presents with B general UE weakness, L side coordination deficits (L side coordination deficits may be at baseline per chart review), impaired cognition, and the need for Mod to Max assist for UB ADLs and Total assist for LB ADLs. Pt currently requires Max assist +2 for bed mobility and Max assist to maintain balance sitting EOB. Pt will benefit from acute skilled OT services to address deficits and increase safety and independence with ADLs. Post acute discharge, pt will benefit from intensive inpatient skilled OT services <3 hours daily.    Recommendations for follow up therapy are one component of a multi-disciplinary discharge planning process, led by the attending physician.  Recommendations may be updated based on patient status, additional functional criteria and insurance authorization.   Assistance Recommended at Discharge Frequent or constant Supervision/Assistance  Patient can return home with the following Two people to help with walking and/or transfers;Two people to help with bathing/dressing/bathroom;Assistance with cooking/housework;Assistance with feeding;Direct supervision/assist for medications management;Direct  supervision/assist for financial management;Assist for transportation;Help with stairs or ramp for entrance    Functional Status Assessment  Patient has had a recent decline in their functional status and demonstrates the ability to make significant improvements in function in a reasonable and predictable amount of time.  Equipment Recommendations  Hospital bed;BSC/3in1    Recommendations for Other Services       Precautions / Restrictions Precautions Precautions: Fall Restrictions Weight Bearing Restrictions: No      Mobility Bed Mobility Overal bed mobility: Needs Assistance Bed Mobility: Rolling, Supine to Sit, Sit to Supine Rolling: Max assist, +2 for physical assistance   Supine to sit: Max assist, +2 for physical assistance, HOB elevated Sit to supine: Max assist, +2 for physical assistance, HOB elevated   General bed mobility comments: maxAx2 for rolling, increased practice due to pt soiled and for peri-care. MaxAx2 for bed mobility for trunk and BLE management    Transfers                   General transfer comment: deferred, pt requiring maxA to balance EOB      Balance Overall balance assessment: Needs assistance Sitting-balance support: Bilateral upper extremity supported, Feet supported Sitting balance-Leahy Scale: Poor Sitting balance - Comments: requiring external support for sitting balance Postural control: Right lateral lean                                 ADL either performed or assessed with clinical judgement   ADL Overall ADL's : Needs assistance/impaired   Eating/Feeding Details (indicate cue type and reason): Pt with PEG tube Grooming: Moderate assistance;Cueing for sequencing;Bed level   Upper Body Bathing: Maximal assistance;Cueing for sequencing;Bed level   Lower Body Bathing: Total assistance;+2 for physical assistance;Bed  level   Upper Body Dressing : Maximal assistance;Bed level   Lower Body Dressing: Total  assistance;+2 for physical assistance;Bed level     Toilet Transfer Details (indicate cue type and reason): Deferred, pt requiring Max assist to sit EOB Toileting- Clothing Manipulation and Hygiene: Total assistance;+2 for physical assistance;Bed level               Vision Baseline Vision/History: 0 No visual deficits (No change from baseline per pt report. Poor spatial awareness.) Ability to See in Adequate Light: 0 Adequate Patient Visual Report: No change from baseline (per pt report) Additional Comments: Pt vision at baseline per pt report. Vision difficult to assess secondary to pt cognitive status.     Perception Perception Perception Tested?: Yes Perception Deficits: Spatial orientation Spatial deficits: Overshoots targets when reaching to grasp an object, such as bed rail for bed mobility.   Praxis Praxis Praxis tested?: Deficits Deficits: Initiation;Motor Impersistence;Organization    Pertinent Vitals/Pain Pain Assessment Pain Assessment: Faces Faces Pain Scale: No hurt     Hand Dominance Left   Extremity/Trunk Assessment Upper Extremity Assessment Upper Extremity Assessment: Generalized weakness;RUE deficits/detail;LUE deficits/detail RUE Deficits / Details: Impaired shoulder flexion (Pt reports ROM is at baseline PLOF.) RUE Coordination: WNL LUE Deficits / Details: Impaired shoulder flexion (Pt reports ROM is at baseline PLOF.) LUE Coordination: decreased fine motor;decreased gross motor (Pt reports at baseline. Per chart review, pt has baseline L side deficits secondary to prior CVA.)   Lower Extremity Assessment Lower Extremity Assessment: Defer to PT evaluation   Cervical / Trunk Assessment Cervical / Trunk Assessment: Kyphotic   Communication Communication Communication: No difficulties   Cognition Arousal/Alertness: Awake/alert Behavior During Therapy: WFL for tasks assessed/performed Overall Cognitive Status: Impaired/Different from baseline  (Impaired, however, may be at baseline. No family/caregiver present to determine baseline cognitive functioning) Area of Impairment: Orientation, Attention, Memory, Following commands, Safety/judgement, Awareness, Problem solving                 Orientation Level: Disoriented to, Place, Situation Current Attention Level: Focused Memory: Decreased short-term memory Following Commands: Follows one step commands consistently, Follows one step commands with increased time (with multimodal cues) Safety/Judgement: Decreased awareness of safety, Decreased awareness of deficits Awareness: Intellectual Problem Solving: Slow processing, Decreased initiation, Difficulty sequencing, Requires verbal cues, Requires tactile cues General Comments: Pt oriented to self and time, reports place as salon but able to [facility] hospital with choices. Pt requiring increased time and min multimodal cueing for following 1 step instructions     General Comments  VSS on room air    Exercises     Shoulder Instructions      Home Living Family/patient expects to be discharged to:: Private residence Living Arrangements: Alone Available Help at Discharge: Family;Available PRN/intermittently Type of Home: Apartment Home Access: Elevator     Home Layout: One level     Bathroom Shower/Tub: Tub/shower unit         Home Equipment: Agricultural consultant (2 wheels);Cane - single point;BSC/3in1;Shower seat;Wheelchair - manual   Additional Comments: Pt is a questionable historian, home living is per pt report as no family able to verify or provide history at this time      Prior Functioning/Environment Prior Level of Function : Patient poor historian/Family not available             Mobility Comments: Pt initially reports ambulating with RW in her home and SPC outside, later states she uses a wheelchair, then later reports not standing "in  a while". Pt does report she drives and states she may have had a few  falls. ADLs Comments: Pt initially reports completing ADLs Independently then reports requiring assistance for LB ADLs. Prior assistance level is unclear. No family/caregiver available to confirm PLOF.        OT Problem List: Decreased strength;Decreased activity tolerance;Decreased coordination;Decreased cognition;Decreased safety awareness;Impaired UE functional use;Impaired balance (sitting and/or standing);Decreased knowledge of use of DME or AE;Decreased knowledge of precautions;Impaired vision/perception      OT Treatment/Interventions: Self-care/ADL training;Therapeutic exercise;Therapeutic activities;Cognitive remediation/compensation;Visual/perceptual remediation/compensation;Patient/family education;Balance training;Neuromuscular education;DME and/or AE instruction    OT Goals(Current goals can be found in the care plan section) Acute Rehab OT Goals Patient Stated Goal: Pt [facility] to return home [facility]. OT Goal Formulation: With patient Time For Goal Achievement: 12/23/22 Potential to Achieve Goals: Fair ADL Goals Pt Will Perform Grooming: with supervision;bed level Pt Will Perform Upper Body Bathing: with mod assist;sitting Pt Will Perform Upper Body Dressing: with min assist;sitting Additional ADL Goal #1: Pt will participate in 5 minutes of functional or therapeutic activity sitting edge of bed demonstrating Selective attention and Fair balance with Min guard assist to increase safety and independence with ADLs.  OT Frequency: Min 2X/week    Co-evaluation PT/OT/SLP Co-Evaluation/Treatment: Yes Reason for Co-Treatment: For patient/therapist safety;To address functional/ADL transfers   OT goals addressed during session: ADL's and self-care      AM-PAC OT "6 Clicks" Daily Activity     Outcome Measure Help from another person eating meals?: Total (Pt with PEG tube) Help from another person taking care of personal grooming?: A Lot Help from another person toileting, which  includes using toliet, bedpan, or urinal?: Total Help from another person bathing (including washing, rinsing, drying)?: Total Help from another person to put on and taking off regular upper body clothing?: A Lot Help from another person to put on and taking off regular lower body clothing?: Total 6 Click Score: 8   End of Session Nurse Communication: Mobility status  Activity Tolerance: Patient tolerated treatment well Patient left: in bed;with call bell/phone within reach;with bed alarm set  OT Visit Diagnosis: Muscle weakness (generalized) (M62.81);Hemiplegia and hemiparesis;Other symptoms and signs involving cognitive function;Apraxia (R48.2);Ataxia, unspecified (R27.0) Hemiplegia - Right/Left: Left Hemiplegia - dominant/non-dominant: Dominant Hemiplegia - caused by: Cerebral infarction (Prior CVA)                Time: 4696-2952 OT Time Calculation (min): 34 min Charges:  OT General Charges $OT Visit: 1 Visit OT Evaluation $OT Eval Moderate Complexity: 1 Mod OT Treatments $Self Care/Home Management : 8-22 mins  Samantha [name] "Orson Eva., OTR/L, MA Acute Rehab [phone]   Lendon Colonel 12/15/2022, 4:55 PM

## 2022-12-15 NOTE — Progress Notes (Signed)
ANTICOAGULATION CONSULT NOTE - Follow-up Consult  Pharmacy Consult for IV heparin Indication: atrial fibrillation  No Known Allergies  Patient Measurements: Height:  (149.9 cm) Weight: 63.3 kg (139 lb 8.8 oz) IBW/kg (Calculated) : 43.2 Heparin Dosing Weight: 56 kg  Vital Signs: Temp: 97.9 F (36.6 C) (04/23 1224) Temp Source: Oral (04/23 1224) BP: 159/66 (04/23 1224) Pulse Rate: 92 (04/23 1224)  Labs: Recent Labs    12/13/22 0146 12/13/22 0845 12/14/22 0501 12/14/22 1435 12/15/22 0124 12/15/22 1204 12/15/22 1233  HGB 10.1*  --  9.6*  --   --  10.3*  --   HCT 30.2*  --  30.1*  --   --  30.6*  --   PLT 147*  --  148*  --   --  163  --   APTT  --    < > 29 196* 40*  --  53*  LABPROT 18.1*  --   --   --   --   --   --   INR 1.5*  --   --   --   --   --   --   HEPARINUNFRC  --    < > >1.10*  --  0.90*  --  0.79*  CREATININE 2.76*  --  3.05*  --   --  2.05*  --    < > = values in this interval not displayed.     Estimated Creatinine Clearance: 18.9 mL/min (A) (by C-G formula based on SCr of 2.05 mg/dL (H)).   Medical History: 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   Dementia    Diabetes mellitus without complication    ESRD (end stage renal disease) on dialysis    Heart failure    HTN (hypertension)    PEG (percutaneous endoscopic gastrostomy) status    Renal disorder    Thyroid disease     Medications:  Infusions:   cefTRIAXone (ROCEPHIN)  IV 2 g (12/15/22 1201)   heparin 600 Units/hr (12/15/22 0435)   metronidazole 500 mg (12/15/22 0719)    Assessment: 77 yo female admitted with acute cholecystitis on chronic Eliquis for "clot in port" in Feb 2024.  Last dose taken 4/19 in the PM per patient report.  Pharmacy asked to start IV heparin while Eliquis on hold for possible surgical need.   4/23 PM update: aPTT: 53 seconds- Heparin level 0.79-not correlating No signs of bleeding or  issues with the heparin infusion   Goal of Therapy:  Heparin level 0.3-0.7 units/ml aPTT 66-102 seconds Monitor platelets by anticoagulation protocol: Yes   Plan:  Increase IV heparin at 750 units/hr at 1400 Check aPTT in 8 hrs - would continue to collect via footstick. Daily heparin level and CBC.  Nelda Luckey A. Jeanella Craze, PharmD, BCPS, FNKF Clinical Pharmacist [place] Please utilize Amion for appropriate phone number to reach the unit pharmacist [facility])

## 2022-12-15 NOTE — Progress Notes (Signed)
Contacted [facility] to inquire about pt's out-pt HD schedule. Awaiting a response.   Olivia Canter Renal Navigator [phone]

## 2022-12-15 NOTE — Progress Notes (Signed)
ANTICOAGULATION CONSULT NOTE - Follow-up Consult  Pharmacy Consult for IV heparin Indication: atrial fibrillation  No Known Allergies  Patient Measurements: Height:  (149.9 cm) Weight: 63.3 kg (139 lb 8.8 oz) IBW/kg (Calculated) : 43.2 Heparin Dosing Weight: 56 kg  Vital Signs: Temp: 97.6 F (36.4 C) (04/23 1940) Temp Source: Oral (04/23 1940) BP: 132/50 (04/23 1940) Pulse Rate: 81 (04/23 1940)  Labs: Recent Labs    12/13/22 0146 12/13/22 0845 12/14/22 0501 12/14/22 1435 12/15/22 0124 12/15/22 1204 12/15/22 1233 12/15/22 2135  HGB 10.1*  --  9.6*  --   --  10.3*  --   --   HCT 30.2*  --  30.1*  --   --  30.6*  --   --   PLT 147*  --  148*  --   --  163  --   --   APTT  --    < > 29   < > 40*  --  53* 66*  LABPROT 18.1*  --   --   --   --   --   --   --   INR 1.5*  --   --   --   --   --   --   --   HEPARINUNFRC  --    < > >1.10*  --  0.90*  --  0.79*  --   CREATININE 2.76*  --  3.05*  --   --  2.05*  --   --    < > = values in this interval not displayed.     Estimated Creatinine Clearance: 18.9 mL/min (A) (by C-G formula based on SCr of 2.05 mg/dL (H)).   Medical History: 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   Dementia    Diabetes mellitus without complication    ESRD (end stage renal disease) on dialysis    Heart failure    HTN (hypertension)    PEG (percutaneous endoscopic gastrostomy) status    Renal disorder    Thyroid disease     Medications:  Infusions:   cefTRIAXone (ROCEPHIN)  IV 2 g (12/15/22 1201)   heparin 750 Units/hr (12/15/22 1358)   metronidazole 500 mg (12/15/22 0719)    Assessment: 77 yo female admitted with acute cholecystitis on chronic Eliquis for "clot in port" in Feb 2024.  Last dose taken 4/19 in the PM per patient report.  Pharmacy asked to start IV heparin while Eliquis on hold for possible surgical need.   aPTT low end therapeutic s/p rate  increase to 750 units/hr  Goal of Therapy:  Heparin level 0.3-0.7 units/ml aPTT 66-102 seconds Monitor platelets by anticoagulation protocol: Yes   Plan:  Increase heparin gtt to 800 units/hr F/u aPTT with AM labs Daily heparin level, aPTT, CBC, s/s bleeding  [name], PharmD, [facility] Clinical Pharmacist ED Pharmacist Phone # [phone] 12/15/2022 10:32 PM

## 2022-12-15 NOTE — Progress Notes (Signed)
CSW received request to contact patient's daughter to answer questions about long term care. CSW left voicemail for Joclyn. Palliative meeting today.   Joaquin Courts, MSW, [facility]

## 2022-12-15 NOTE — Progress Notes (Signed)
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  On presentation, LFTs were normal, WBC of 2100, lactic acid of 1.  Chest x-ray showed no acute cardiopulmonary process.  CT of abdomen/pelvis showed dilated gallbladder with stones including a stone which may be impacted in the neck of the gallbladder; acute cholecystitis is possible.  Right upper quadrant ultrasound showed findings suggestive of acute cholecystitis.  General surgery was consulted  Assessment & Plan:   Abdominal pain with concern for acute calculus cholecystitis -Imaging as above.  General surgery following and not that was convinced that patient has acute cholecystitis. -HIDA scan still pending further evaluation of acute cholecystitis diagnosis.  The scan has been done, reading is still pending -Continue with diet per general surgery recommendation -Continue with Rocephin and Flagyl.  End-stage renal disease on hemodialysis -Nephrology has been consulted.  HD per renal  Failure to thrive History of ischemic CVA with residual left-sided weakness Dysphagia requiring PEG tube feedings Physical deconditioning -Dietitian consulted to resume PEG tube feeding.  Patient still is able to eat by mouth but oral intake is poor. -Consult palliative care for goals of care discussion -PT/OT consulted  History of DVT with chronic anticoagulation Daughter report patient had DVT at previous [facility] site which has been discontinued by February -Eliquis on hold.  Currently on heparin drip.  She will need total anticoagulation for 3 months.  Diabetes mellitus type 2  -Continue long-acting insulin  along with CBGs with SSI  Hyponatremia -mild.  Managed by hemodialysis.  Hypokalemia -Mild.  Monitor  Anemia of chronic disease -From chronic renal failure.  Hemoglobin currently stable.  Monitor intermittently  Thrombocytopenia -Mild.  Monitor intermittently.  No signs of bleeding  Essential hypertension -Blood pressure on the lower side.  Amlodipine, Coreg, losartan, hydrochlorothiazide on hold    DVT prophylaxis: Heparin drip Code Status: Full Family Communication: Cussed with both daughter at bedside/22, and daughter by phone on 4/22. Disposition Plan: Status is: Inpatient Remains inpatient appropriate because: Of severity of illness    Consultants: General surgery.  Renal,  palliative  care  Procedures: None  Antimicrobials: None currently   Subjective:  Significant events overnight as discussed with staff, she denies any nausea, vomiting.  Objective: Vitals:   12/15/22 0500 12/15/22 0510 12/15/22 0523 12/15/22 1224  BP:    (!) 159/66  Pulse:  (!) 161  92  Resp:  17  18  Temp:   99.3 F (37.4 C) 97.9 F (36.6 C)  TempSrc:   Oral Oral  SpO2:  100%  94%  Weight: 63.3 kg     Height:        Intake/Output Summary (Last 24 hours) at 12/15/2022 1354 Last data filed at 12/15/2022 0500 Gross per 24 hour  Intake 50 ml  Output 600 ml  Net -550 ml   Filed Weights   12/14/22 0841 12/14/22 1153 12/15/22 0500  Weight: 63.5 kg 63.5 kg 63.3 kg    Examination:  Awake Alert, pleasant, no apparent distress, chronically ill-appearing, deconditioned Symmetrical Chest wall movement, Good air movement bilaterally, CTAB RRR,No Gallops,Rubs or new Murmurs, No Parasternal Heave +ve B.Sounds, Abd Soft, PEG + no Cyanosis, Clubbing or edema, No new Rash  or bruise       Data Reviewed: I have personally reviewed following labs and imaging studies  CBC: Recent Labs  Lab 12/12/22 1917 12/13/22 0146 12/14/22 0501 12/15/22 1204  WBC 10.1 8.8 6.7 9.2  NEUTROABS  6.1 5.2  --   --   HGB 11.1* 10.1* 9.6* 10.3*  HCT 35.1* 30.2* 30.1* 30.6*  MCV 92.9 90.4 94.1 91.3  PLT 176 147* 148* 163   Basic Metabolic Panel: Recent Labs  Lab 12/12/22 1917 12/13/22 0146 12/14/22 0501 12/15/22 0124 12/15/22 1204  NA 134* 131* 135  --  139  K 3.6 3.3* 3.4*  --  3.6  CL 95* 95* 103  --  102  CO2 --  25  GLUCOSE 166* 136* 134*  --  147*  BUN 39* 43* 50*  --  20  CREATININE 2.60* 2.76* 3.05*  --  2.05*  CALCIUM 9.7 9.1 8.4*  --  8.6*  MG 2.4 2.2 2.2 1.8  --   PHOS  --  2.1*  --  <1.0* 3.1   GFR: Estimated Creatinine Clearance: 18.9 mL/min (A) (by C-G formula based on SCr of 2.05 mg/dL (H)). Liver Function Tests: Recent Labs  Lab 12/12/22 1917 12/13/22 0146 12/14/22 0501 12/15/22 1204  AST --   ALT --   ALKPHOS 63 54 47  --   BILITOT 0.5 0.4 0.3  --   PROT 8.1 7.1 6.2*  --   ALBUMIN 4.2 3.7 3.2* 3.6   No results for input(s): "LIPASE", "AMYLASE" in the last 168 hours. No results for input(s): "AMMONIA" in the last 168 hours. Coagulation Profile: Recent Labs  Lab 12/13/22 0146  INR 1.5*   Cardiac Enzymes: No results for input(s): "CKTOTAL", "CKMB", "CKMBINDEX", "TROPONINI" in the last 168 hours. BNP (last 3 results) No results for input(s): "PROBNP" in the last 8760 hours. HbA1C: Recent Labs    12/13/22 1922  HGBA1C 7.3*   CBG: Recent Labs  Lab 12/14/22 1240 12/14/22 1806 12/14/22 2248 12/15/22 0322 12/15/22 1222  GLUCAP 138* 124* 305* 148* 150*   Lipid Profile: No results for input(s): "CHOL", "HDL", "LDLCALC", "TRIG", "CHOLHDL", "LDLDIRECT" in the last 72 hours. Thyroid Function Tests: No results for input(s): "TSH", "T4TOTAL", "FREET4", "T3FREE", "THYROIDAB" in the last 72 hours. Anemia Panel: Recent Labs    12/15/22 0124  FERRITIN 3,281*  TIBC 197*  IRON 177*   Sepsis Labs: Recent Labs  Lab 12/13/22 0146  LATICACIDVEN 1.0    No results found for this or any previous visit (from  the past 240 hour(s)).       Radiology Studies: No results found.      Scheduled Meds:  Chlorhexidine Gluconate Cloth  6 each Topical Q0600   [START ON 12/16/2022] darbepoetin (ARANESP) injection - DIALYSIS  60 mcg Subcutaneous Q Wed-1800   feeding supplement (OSMOLITE 1.5 CAL)  1,000 mL Per Tube Q24H   free water  40 mL Per Tube Q2H   insulin aspart  0-6 Units Subcutaneous TID WC   insulin glargine-yfgn  10 Units Subcutaneous QHS   multivitamin  1 tablet Oral QHS   Continuous Infusions:  cefTRIAXone (ROCEPHIN)  IV 2 g (12/15/22 1201)   heparin 600 Units/hr (12/15/22 0435)   metronidazole 500 mg (12/15/22 0719)          Huey Bienenstock, MD [facility] 12/15/2022, 1:54 PM

## 2022-12-15 NOTE — Evaluation (Signed)
Clinical/Bedside Swallow Evaluation Patient Details  Name: Samantha [name] MRN: [id number] Date of Birth: 23-Jan-1946  Today's Date: 12/15/2022 Time: SLP Start Time (ACUTE ONLY): 1340 SLP Stop Time (ACUTE ONLY): 1401 SLP Time Calculation (min) (ACUTE ONLY): 21 min  Past Medical History:  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   Dementia    Diabetes mellitus without complication    ESRD (end stage renal disease) on dialysis    Heart failure    HTN (hypertension)    PEG (percutaneous endoscopic gastrostomy) status    Renal disorder    Thyroid disease    Past Surgical History:  Past Surgical History:  Procedure Laterality Date   ABDOMINAL HYSTERECTOMY     HPI:  77 y.o. female admitted on 12/12/2022 with abdominal pain and concern for acute cholecystitis. 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 Per RD notes, pt does not use PEG; eats three meals/day.    Assessment / Plan / Recommendation  Clinical Impression  Pt participated in clinical swallow assessment.  Oral mechanism exam - mild right asymmetry tongue; no other focal CN deficits.  Pt presents with thorough but slow mastication, no oral residue post-swallow; drinks thin liquids with no s/s of aspiration.  There is no indication of an ongoing dysphagia.  Recommend continuing current diet (regular solids/thin liquids).  Pt/family meeting with Palliative care today.  No further SLP f/u is needed. SLP Visit Diagnosis: Dysphagia, unspecified (R13.10)    Aspiration Risk  No limitations    Diet Recommendation   Regular solids, thin liquids  Medication Administration: Whole meds with liquid    Other  Recommendations Oral Care Recommendations: Oral care BID    Recommendations for follow  up therapy are one component of a multi-disciplinary discharge planning process, led by the attending physician.  Recommendations may be updated based on patient status, additional functional criteria and insurance authorization.  Follow up Recommendations No SLP follow up        Swallow Study   General HPI: 77 y.o. female admitted on 12/12/2022 with abdominal pain and concern for acute cholecystitis. 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 Per RD notes, pt does not use PEG; eats three meals/day. Type of Study: Bedside Swallow Evaluation Previous Swallow Assessment: none per EMR Diet Prior to this Study: Regular;Thin liquids (Level 0) Temperature Spikes Noted: No Respiratory Status: Room air History of Recent Intubation: No Behavior/Cognition: Alert;Cooperative Oral Cavity Assessment: Within Functional Limits Oral Care Completed by SLP: No Oral Cavity - Dentition: Missing dentition Vision: Functional for self-feeding Self-Feeding Abilities: Able to feed self Patient Positioning: Upright in bed Baseline Vocal Quality: Hoarse Volitional Cough: Strong Volitional Swallow: Able to elicit    Oral/Motor/Sensory Function Overall Oral Motor/Sensory Function: Mild impairment Facial ROM: Within Functional Limits Facial Symmetry: Within Functional Limits Facial Sensation: Within Functional Limits Lingual Symmetry: Abnormal symmetry right   Ice Chips Ice chips: Within functional limits   Thin Liquid Thin Liquid: Within functional limits    Nectar Thick Nectar Thick Liquid: Not tested   Honey Thick Honey Thick Liquid: Not tested   Puree Puree: Within functional limits  Solid     Solid: Within functional limits      Samantha [name] 12/15/2022,2:03 PM Samantha Folks L. [name], MA CCC/SLP Clinical  Specialist - Acute Care SLP Acute Rehabilitation Services Office number [phone]

## 2022-12-15 NOTE — Progress Notes (Addendum)
Subjective: Seen in Rm just arrived back from nuclear med study, no current complaints said tolerated dialysis yesterday.  Now is going to be working with PT/OT-  pleasant- no c/o's     Objective Vital signs in last 24 hours: Vitals:   12/15/22 0000 12/15/22 0500 12/15/22 0510 12/15/22 0523  BP: (!) 151/47     Pulse: 90  (!) 161   Resp:   17   Temp: 99 F (37.2 C)   99.3 F (37.4 C)  TempSrc:    Oral  SpO2: 97%  100%   Weight:  63.3 kg    Height:       Weight change:   Physical Exam: General: Alert elderly female NAD pleasant Heart: RRR no MRG Lungs: CTA anteriorly nonlabored breathing Abdomen: NABS, soft NTND PEG tube left quadrant Extremities: No pedal edema  dialysis Access: TDC cath dry intact  OP HD = orders that were entered for Adm  Farm/SW=3 hours 15 minutes- BFR 350 TDC- heparin 1500 units then 750 mid run   Problem/Plan: Abdominal pain workup for cholestasis= surgery involved, has PEG , just back from HIDA scan-  results pending ESRD -HD in hospital MWF (moved from [place]- has not officially started HD at [facility]) prior 3 hours 15 minutes as outpatient order will use 3.5 hrs  for now follow-up labs/noted palliative care seeing with family meeting with 4 children today  appreciate help /?not sure why she never had an AVF placed hold any scheduling until further palliative talks  HTN/volume -mild volume on exam, no BP meds given here- according to list  on 4 meds including HCTZ- to simplify upon discharge- first order of business is to get to EDW then she may not need BP meds, hopefully can have standing weights at dialysis, attempt 2 to 2.5 L UF tomorrow HD Anemia -Hgb 9.6<11.1  /Sat. Ratio 90/Ferritin 3281 / Aranesp 60,mcg  given Mon 4/22  Secondary hyperparathyroidism -phosphorus was less than 1 getting supplement, calcium corrected stable, PTH pending Nutrition-ALB 3.2, on Osmolite supplement /and noted on regular diet needs fluid restriction with ESRD, also has PEG  with history of CVA?  Swallowing issue/plan per admit  Lenny Pastel, PA-C [facility] Beeper [phone] 12/15/2022,11:55 AM  LOS: 3 days   Patient seen and examined, agree with above note with above modifications. Terribly debilitated woman it seems that moved here from [place]-  had been on HD there but not really managed well-  short HD time-  too many BP meds that likely aren't needed-  no permanent access-  need to get to know her here-  appreciatge palliative so we can see what the family perceptions are and try to tune her up as best we can-  HD tomorrow  Annie Sable, MD 12/15/2022    Labs: Basic Metabolic Panel: Recent Labs  Lab 12/12/22 1917 12/13/22 0146 12/14/22 0501 12/15/22 0124  NA 134* 131* 135  --   K 3.6 3.3* 3.4*  --   CL 95* 95* 103  --   CO2 --   GLUCOSE 166* 136* 134*  --   BUN 39* 43* 50*  --   CREATININE 2.60* 2.76* 3.05*  --   CALCIUM 9.7 9.1 8.4*  --   PHOS  --  2.1*  --  <1.0*   Liver Function Tests: Recent Labs  Lab 12/12/22 1917 12/13/22 0146 12/14/22 0501  AST ALT ALKPHOS 63 54 47  BILITOT 0.5 0.4 0.3  PROT 8.1 7.1 6.2*  ALBUMIN 4.2 3.7 3.2*   No results for input(s): "LIPASE", "AMYLASE" in the last 168 hours. No results for input(s): "AMMONIA" in the last 168 hours. CBC: Recent Labs  Lab 12/12/22 1917 12/13/22 0146 12/14/22 0501  WBC 10.1 8.8 6.7  NEUTROABS 6.1 5.2  --   HGB 11.1* 10.1* 9.6*  HCT 35.1* 30.2* 30.1*  MCV 92.9 90.4 94.1  PLT 176 147* 148*   Cardiac Enzymes: No results for input(s): "CKTOTAL", "CKMB", "CKMBINDEX", "TROPONINI" in the last 168 hours. CBG: Recent Labs  Lab 12/14/22 0813 12/14/22 1240 12/14/22 1806 12/14/22 2248 12/15/22 0322  GLUCAP 160* 138* 124* 305* 148*    Studies/Results: No results found. Medications:  cefTRIAXone (ROCEPHIN)  IV 2 g (12/14/22 1238)   heparin 600 Units/hr (12/15/22 0435)   metronidazole 500 mg (12/15/22 0719)     Chlorhexidine Gluconate Cloth  6 each Topical Q0600   [START ON 12/16/2022] darbepoetin (ARANESP) injection - DIALYSIS  60 mcg Subcutaneous Q Wed-1800   feeding supplement (OSMOLITE 1.5 CAL)  1,000 mL Per Tube Q24H   free water  40 mL Per Tube Q2H   insulin aspart  0-6 Units Subcutaneous TID WC   insulin glargine-yfgn  10 Units Subcutaneous QHS   multivitamin  1 tablet Oral QHS   phosphorus  250 mg Per Tube Once

## 2022-12-15 NOTE — Care Management Important Message (Signed)
Important Message  Patient Details  Name: Samantha [name] MRN: [id number] Date of Birth: 1945-10-27   Medicare Important Message Given:  Yes     Shreeya Recendiz Stefan Church 12/15/2022, 8:48 AM

## 2022-12-16 ENCOUNTER — Inpatient Hospital Stay (HOSPITAL_COMMUNITY): Payer: 59

## 2022-12-16 DIAGNOSIS — R1013 Epigastric pain: Secondary | ICD-10-CM | POA: Diagnosis not present

## 2022-12-16 DIAGNOSIS — K810 Acute cholecystitis: Secondary | ICD-10-CM | POA: Diagnosis not present

## 2022-12-16 DIAGNOSIS — N186 End stage renal disease: Secondary | ICD-10-CM | POA: Diagnosis not present

## 2022-12-16 DIAGNOSIS — E119 Type 2 diabetes mellitus without complications: Secondary | ICD-10-CM | POA: Diagnosis not present

## 2022-12-16 HISTORY — PX: IR PERC CHOLECYSTOSTOMY: IMG2326

## 2022-12-16 LAB — GLUCOSE, CAPILLARY
Glucose-Capillary: 139 mg/dL — ABNORMAL HIGH (ref 70–99)
Glucose-Capillary: 154 mg/dL — ABNORMAL HIGH (ref 70–99)
Glucose-Capillary: 156 mg/dL — ABNORMAL HIGH (ref 70–99)
Glucose-Capillary: 168 mg/dL — ABNORMAL HIGH (ref 70–99)

## 2022-12-16 LAB — APTT: aPTT: 97 seconds — ABNORMAL HIGH (ref 24–36)

## 2022-12-16 LAB — PTH, INTACT AND CALCIUM
Calcium, Total (PTH): 8.7 mg/dL (ref 8.7–10.3)
PTH: 321 pg/mL — ABNORMAL HIGH (ref 15–65)

## 2022-12-16 LAB — HEPARIN LEVEL (UNFRACTIONATED): Heparin Unfractionated: 0.84 IU/mL — ABNORMAL HIGH (ref 0.30–0.70)

## 2022-12-16 LAB — AEROBIC/ANAEROBIC CULTURE W GRAM STAIN (SURGICAL/DEEP WOUND): Gram Stain: NONE SEEN

## 2022-12-16 MED ORDER — MIDAZOLAM HCL 2 MG/2ML IJ SOLN
INTRAMUSCULAR | Status: AC | PRN
  Administered 2022-12-16: 1 mg via INTRAVENOUS

## 2022-12-16 MED ORDER — IOHEXOL 300 MG/ML  SOLN
50.0000 mL | Freq: Once | INTRAMUSCULAR | Status: AC | PRN
  Administered 2022-12-16: 10 mL

## 2022-12-16 MED ORDER — HEPARIN SODIUM (PORCINE) 1000 UNIT/ML IJ SOLN
INTRAMUSCULAR | Status: AC
  Administered 2022-12-16: 1000 [IU]
  Filled 2022-12-16: qty 5

## 2022-12-16 MED ORDER — FENTANYL CITRATE (PF) 100 MCG/2ML IJ SOLN
INTRAMUSCULAR | Status: AC | PRN
  Administered 2022-12-16: 50 ug via INTRAVENOUS
  Administered 2022-12-16: 25 ug via INTRAVENOUS

## 2022-12-16 MED ORDER — SODIUM CHLORIDE 0.9 % IV SOLN
INTRAVENOUS | Status: AC | PRN
  Administered 2022-12-16: 10 mL/h via INTRAVENOUS

## 2022-12-16 MED ORDER — HEPARIN (PORCINE) 25000 UT/250ML-% IV SOLN
800.0000 [IU]/h | INTRAVENOUS | Status: DC
  Administered 2022-12-19: 800 [IU]/h via INTRAVENOUS
  Filled 2022-12-16 (×2): qty 250

## 2022-12-16 MED ORDER — SODIUM CHLORIDE 0.9 % IV SOLN
2.0000 g | Freq: Once | INTRAVENOUS | Status: AC
  Administered 2022-12-16: 2 g via INTRAVENOUS
  Filled 2022-12-16: qty 2

## 2022-12-16 MED ORDER — HEPARIN SODIUM (PORCINE) 1000 UNIT/ML IJ SOLN
INTRAMUSCULAR | Status: AC
  Administered 2022-12-16: 1000 [IU]
  Filled 2022-12-16: qty 2

## 2022-12-16 MED ORDER — LIDOCAINE HCL 1 % IJ SOLN
INTRAMUSCULAR | Status: AC
  Filled 2022-12-16: qty 20

## 2022-12-16 MED ORDER — MIDAZOLAM HCL 2 MG/2ML IJ SOLN
INTRAMUSCULAR | Status: AC
  Filled 2022-12-16: qty 2

## 2022-12-16 MED ORDER — SODIUM CHLORIDE 0.9% FLUSH
5.0000 mL | Freq: Three times a day (TID) | INTRAVENOUS | Status: DC
  Administered 2022-12-16 – 2022-12-24 (×22): 5 mL

## 2022-12-16 MED ORDER — FENTANYL CITRATE (PF) 100 MCG/2ML IJ SOLN
INTRAMUSCULAR | Status: AC
  Filled 2022-12-16: qty 2

## 2022-12-16 NOTE — Progress Notes (Signed)
Patient daughter asking to fill FL2 form in order to add this patient to medicaid.

## 2022-12-16 NOTE — Progress Notes (Signed)
PROGRESS NOTE        PATIENT DETAILS Name: Samantha [name] Age: 77 y.o. Sex: female Date of Birth: 1946-02-05 Admit Date: 12/12/2022 Admitting Physician Angie Fava, DO ZOX:[id number], No Pcp Per  Brief Summary: Patient is a 77 y.o.  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  Significant events: 4/20>> admit to [facility]  Significant studies: 4/20>> CXR: No acute cardiopulmonary disease. 4/20>> CT abdomen/pelvis: Dilated gallbladder-with stones-including a stone which may be impacted in the neck of the gallbladder.  IVC filter in place. 4/20>> RUQ ultrasound: Cholelithiasis with positive Murphy sign. 4/23>> HIDA scan: Nonvisualization of gallbladder consistent with cystic duct obstruction.  Significant microbiology data: None  Procedures: None  Consults: General Surgery  Subjective: Lying comfortably in bed-continues to complain of some RUQ pain.  Seems like this is better than the past few days.  Objective: Vitals: Blood pressure (!) 152/82, pulse 98, temperature 98.3 F (36.8 C), temperature source Oral, resp. rate 16, height  (1.499 m), weight 63.3 kg, SpO2 100 %.   Exam: Gen Exam:Alert awake-not in any distress HEENT:atraumatic, normocephalic Chest: B/L clear to auscultation anteriorly CVS:S1S2 regular Abdomen: Soft-mildly tender in the RUQ area. Extremities:no edema Neurology: Left-sided hemiparesis at baseline. Skin: no rash  Pertinent Labs/Radiology:    Latest Ref Rng & Units 12/15/2022   12:04 PM 12/14/2022    5:01 AM 12/13/2022    1:46 AM  CBC  WBC 4.0 - 10.5 K/uL 9.2  6.7  8.8   Hemoglobin 12.0 - 15.0 g/dL 09.8  9.6  11.9   Hematocrit 36.0 - 46.0 % 30.6  30.1  30.2   Platelets 150 - 400 K/uL 163  148  147     Lab Results  Component  Value Date   NA 139 12/15/2022   K 3.6 12/15/2022   CL 102 12/15/2022   CO2 25 12/15/2022      Assessment/Plan: Acute calculus cholecystitis Initial imaging studies were equivocal-however HIDA scan in 4/23 is +ve Keep n.p.o.-hold tube feeds-hold IV heparin Continue Rocephin/Flagyl Spoke with [facility] team this morning-await recommendations.    ESRD on HD MWF Nephrology following.  Normocytic anemia Secondary to ESRD Iron/Aranesp defer to nephrology service  DVT Per CT-IVC filter in place Eliquis on hold-on IV heparin For prior chart review-DVT had previous TDC site-this past February.  Plan anticoagulation x 3 months.  History of CVA with left residual hemiparesis Dysphagia with PEG tube in place Failure to thrive syndrome Tube feeds on hold for possible cholecystectomy/cholecystostomy Supportive care PT/OT/palliative care/nutrition services all following.  HTN BP relatively stable without any antihypertensives Amlodipine/Coreg/losartan/HCTZ on hold  DM-2 (A1c 7.3 on 4/21) CBG stable Semglee 10 units daily-SSI  Recent Labs    12/15/22 1941 12/15/22 2322 12/16/22 0333  GLUCAP 214* 158* 154*    Nutrition Status: Nutrition Problem: Increased nutrient needs Etiology: acute illness Signs/Symptoms: estimated needs Interventions: MVI, Tube feeding  BMI: Estimated body mass index is 28.19 kg/m as calculated from the following:   Height as of this encounter:  (1.499 m).   Weight as of this encounter: 63.3 kg.   Code status:   Code Status: Full Code   DVT Prophylaxis: SCDs Start: 12/12/22 2115   Family  Communication: None at bedside   Disposition Plan: Status is: Inpatient Remains inpatient appropriate because: Severity of illness   Planned Discharge Destination:Home   Diet: Diet Order             Diet NPO time specified  Diet effective now                     Antimicrobial agents: Anti-infectives (From admission, onward)    Start      Dose/Rate Route Frequency Ordered Stop   12/13/22 0800  cefTRIAXone (ROCEPHIN) 2 g in sodium chloride 0.9 % 100 mL IVPB        2 g 200 mL/hr over 30 Minutes Intravenous Every 24 hours 12/13/22 0753     12/13/22 0800  metroNIDAZOLE (FLAGYL) IVPB 500 mg        500 mg 100 mL/hr over 60 Minutes Intravenous Every 12 hours 12/13/22 0753          MEDICATIONS: Scheduled Meds:  Chlorhexidine Gluconate Cloth  6 each Topical Q0600   darbepoetin (ARANESP) injection - DIALYSIS  60 mcg Subcutaneous Q Wed-1800   feeding supplement (OSMOLITE 1.5 CAL)  1,000 mL Per Tube Q24H   insulin aspart  0-6 Units Subcutaneous TID WC   insulin glargine-yfgn  10 Units Subcutaneous QHS   multivitamin  1 tablet Oral QHS   Continuous Infusions:  cefTRIAXone (ROCEPHIN)  IV 2 g (12/15/22 1201)   heparin Stopped (12/16/22 0754)   metronidazole 500 mg (12/15/22 2247)   PRN Meds:.acetaminophen **OR** acetaminophen, fentaNYL (SUBLIMAZE) injection, melatonin, naLOXone (NARCAN)  injection, ondansetron (ZOFRAN) IV   I have personally reviewed following labs and imaging studies  LABORATORY DATA: CBC: Recent Labs  Lab 12/12/22 1917 12/13/22 0146 12/14/22 0501 12/15/22 1204  WBC 10.1 8.8 6.7 9.2  NEUTROABS 6.1 5.2  --   --   HGB 11.1* 10.1* 9.6* 10.3*  HCT 35.1* 30.2* 30.1* 30.6*  MCV 92.9 90.4 94.1 91.3  PLT 176 147* 148* 163    Basic Metabolic Panel: Recent Labs  Lab 12/12/22 1917 12/13/22 0146 12/14/22 0501 12/15/22 0124 12/15/22 1204  NA 134* 131* 135  --  139  K 3.6 3.3* 3.4*  --  3.6  CL 95* 95* 103  --  102  CO2 --  25  GLUCOSE 166* 136* 134*  --  147*  BUN 39* 43* 50*  --  20  CREATININE 2.60* 2.76* 3.05*  --  2.05*  CALCIUM 9.7 9.1 8.4*  --  8.6*  MG 2.4 2.2 2.2 1.8  --   PHOS  --  2.1*  --  <1.0* 3.1    GFR: Estimated Creatinine Clearance: 18.9 mL/min (A) (by C-G formula based on SCr of 2.05 mg/dL (H)).  Liver Function Tests: Recent Labs  Lab 12/12/22 1917  12/13/22 0146 12/14/22 0501 12/15/22 1204  AST --   ALT --   ALKPHOS 63 54 47  --   BILITOT 0.5 0.4 0.3  --   PROT 8.1 7.1 6.2*  --   ALBUMIN 4.2 3.7 3.2* 3.6   No results for input(s): "LIPASE", "AMYLASE" in the last 168 hours. No results for input(s): "AMMONIA" in the last 168 hours.  Coagulation Profile: Recent Labs  Lab 12/13/22 0146  INR 1.5*    Cardiac Enzymes: No results for input(s): "CKTOTAL", "CKMB", "CKMBINDEX", "TROPONINI" in the last 168 hours.  BNP (last 3 results) No results for input(s): "PROBNP" in  the last 8760 hours.  Lipid Profile: No results for input(s): "CHOL", "HDL", "LDLCALC", "TRIG", "CHOLHDL", "LDLDIRECT" in the last 72 hours.  Thyroid Function Tests: No results for input(s): "TSH", "T4TOTAL", "FREET4", "T3FREE", "THYROIDAB" in the last 72 hours.  Anemia Panel: Recent Labs    12/15/22 0124  FERRITIN 3,281*  TIBC 197*  IRON 177*    Urine analysis:    Component Value Date/Time   COLORURINE YELLOW 12/13/2022 1104   APPEARANCEUR HAZY (A) 12/13/2022 1104   LABSPEC 1.011 12/13/2022 1104   PHURINE 5.0 12/13/2022 1104   GLUCOSEU 150 (A) 12/13/2022 1104   HGBUR MODERATE (A) 12/13/2022 1104   BILIRUBINUR NEGATIVE 12/13/2022 1104   KETONESUR NEGATIVE 12/13/2022 1104   PROTEINUR 100 (A) 12/13/2022 1104   NITRITE NEGATIVE 12/13/2022 1104   LEUKOCYTESUR NEGATIVE 12/13/2022 1104    Sepsis Labs: Lactic Acid, Venous    Component Value Date/Time   LATICACIDVEN 1.0 12/13/2022 0146    MICROBIOLOGY: No results found for this or any previous visit (from the past 240 hour(s)).  RADIOLOGY STUDIES/RESULTS: NM Hepatobiliary Liver Func  Result Date: 12/15/2022 CLINICAL DATA:  Right upper quadrant abdominal pain. Abnormal ultrasound. EXAM: NUCLEAR MEDICINE HEPATOBILIARY IMAGING TECHNIQUE: Sequential images of the abdomen were obtained out to 60 minutes following intravenous administration of radiopharmaceutical.  RADIOPHARMACEUTICALS:  5.4 mCi Tc-73m  Choletec IV COMPARISON:  CT and ultrasound examinations 12/12/2022 FINDINGS: Prompt symmetric uptake in the liver and prompt excretion into the biliary tree. Activity is seen in the small early during the study. No activity was seen in the gallbladder after 1 hour of imaging. Patient received 2.5 mg morphine sulfate IV and additional 1 hour imaging was performed. The gallbladder was not visualized. Findings consistent with cystic duct obstruction. IMPRESSION: Nonvisualization of the gallbladder consistent with cystic duct obstruction. Electronically Signed   By: Rudie Meyer M.D.   On: 12/15/2022 13:21     LOS: 4 days   Jeoffrey Massed, MD  [facility]    To contact the attending provider between 7A-7P or the covering provider during after hours 7P-7A, please log into the web site [email or web address] and access using universal [place] password for that web site. If you do not have the password, please call the hospital operator.  12/16/2022, 8:05 AM

## 2022-12-16 NOTE — [place] FL2 (Signed)
[place] MEDICAID [id number] LEVEL OF CARE FORM     IDENTIFICATION  Patient Name: Samantha [name] Birthdate: 21-May-1946 Sex: female Admission Date (Current Location): 12/12/2022  [facility] and [place] Number:  Producer, television/film/video and Address:  [facility] [place], [address], [place], [place] [address]      Provider Number: [id number]  Attending Physician Name and Address:  Maretta Bees, MD  Relative Name and Phone Number:       Current Level of Care: Hospital Recommended Level of Care: Skilled Nursing Facility Prior Approval Number:    Date Approved/Denied:   PASRR Number: [id number]  Discharge Plan: SNF    Current Diagnoses: Patient Active Problem List   Diagnosis Date Noted   Epigastric pain 12/13/2022   End-stage renal disease on hemodialysis 12/13/2022   Failure to thrive in adult 12/13/2022   DM2 (diabetes mellitus, type 2) 12/13/2022   Essential hypertension 12/13/2022   History of DVT (deep vein thrombosis) 12/13/2022   CVA (cerebral vascular accident) 12/12/2022   Acute cholecystitis 12/12/2022    Orientation RESPIRATION BLADDER Height & Weight     Self, Situation  Normal Incontinent, External catheter Weight: 139 lb 8.8 oz (63.3 kg) Height:   (149.9 cm)  BEHAVIORAL SYMPTOMS/MOOD NEUROLOGICAL BOWEL NUTRITION STATUS      Incontinent Feeding tube (OSMOLITE (1.5 CAL) liquid 1,000 mL)  AMBULATORY STATUS COMMUNICATION OF NEEDS Skin   Extensive Assist Verbally Normal                       Personal Care Assistance Level of Assistance  Bathing, Feeding, Dressing Bathing Assistance: Maximum assistance Feeding assistance: Maximum assistance Dressing Assistance: Maximum assistance     Functional Limitations Info             SPECIAL CARE FACTORS FREQUENCY                       Contractures Contractures Info: Not present    Additional Factors Info  Code Status, Allergies, Insulin Sliding Scale Code Status  Info: Full Allergies Info: NKA   Insulin Sliding Scale Info: See dc summary       Current Medications (12/16/2022):  This is the current hospital active medication list Current Facility-Administered Medications  Medication Dose Route Frequency Provider Last Rate Last Admin   acetaminophen (TYLENOL) tablet 650 mg  650 mg Oral Q6H PRN Howerter, Justin B, DO   650 mg at 12/15/22 2248   Or   acetaminophen (TYLENOL) suppository 650 mg  650 mg Rectal Q6H PRN Howerter, Justin B, DO       cefTRIAXone (ROCEPHIN) 2 g in sodium chloride 0.9 % 100 mL IVPB  2 g Intravenous Q24H Alekh, Kshitiz, MD 200 mL/hr at 12/15/22 1201 2 g at 12/15/22 1201   Chlorhexidine Gluconate Cloth 2 % PADS 6 each  6 each Topical Q0600 Delano Metz, MD   6 each at 12/16/22 5621   Darbepoetin Alfa (ARANESP) injection 60 mcg  60 mcg Subcutaneous Q Wed-1800 Annie Sable, MD       feeding supplement (OSMOLITE 1.5 CAL) liquid 1,000 mL  1,000 mL Per Tube Q24H Elgergawy, Leana Roe, MD   1,000 mL at 12/15/22 1745   fentaNYL (SUBLIMAZE) injection 25 mcg  25 mcg Intravenous Q2H PRN Howerter, Justin B, DO       heparin ADULT infusion 100 units/mL (25000 units/225mL)  800 Units/hr Intravenous Continuous [name], RPH   Stopped at 12/16/22  0754   insulin aspart (novoLOG) injection 0-6 Units  0-6 Units Subcutaneous TID WC Howerter, Justin B, DO   1 Units at 12/15/22 1730   insulin glargine-yfgn (SEMGLEE) injection 10 Units  10 Units Subcutaneous QHS Howerter, Justin B, DO   10 Units at 12/15/22 2247   melatonin tablet 3 mg  3 mg Oral QHS PRN Howerter, Justin B, DO   3 mg at 12/15/22 2248   metroNIDAZOLE (FLAGYL) IVPB 500 mg  500 mg Intravenous Q12H Alekh, Kshitiz, MD 100 mL/hr at 12/15/22 2247 500 mg at 12/15/22 2247   multivitamin (RENA-VIT) tablet 1 tablet  1 tablet Oral QHS Elgergawy, Leana Roe, MD   1 tablet at 12/15/22 2248   naloxone [facility]) injection 0.4 mg  0.4 mg Intravenous PRN Howerter, Justin B, DO        ondansetron (ZOFRAN) injection 4 mg  4 mg Intravenous Q6H PRN Howerter, Justin B, DO   4 mg at 12/15/22 1851     Discharge Medications: Please see discharge summary for a list of discharge medications.  Relevant Imaging Results:  Relevant Lab Results:   Additional Information SSN: [id number]. Requires outpatient transport to Dialysis. Currently set up at [facility]). Days TBD. Will need hoyer lift for transfers.   Mearl Latin, LCSW

## 2022-12-16 NOTE — Consult Note (Signed)
Chief Complaint: Patient was seen in consultation today for percutaneous cholecystostomy drain placement Chief Complaint  Patient presents with   Shortness of Breath   at the request of CCS  Supervising Physician: Gilmer Mor  Patient Status: [facility] - In-pt  History of Present Illness: Samantha [name] is a 77 y.o. female   Abd pain Nausea ESRD on HD MWF DVT s/p IVC filter on DOAC at home  Hx CVA with L hemiparesis, dysphagia, and failure to thrive  DM2  HTN Normocytic anemia  Secondary hyperparathyroidis  Code status: full code  CT 4/20: IMPRESSION: Dilated gallbladder with stones including a stone which may be impacted in the neck of the gallbladder. Acute cholecystitis is possible.  Hida yesterday:  IMPRESSION: Nonvisualization of the gallbladder consistent with cystic duct obstruction.  CCS note today:  Acute cholecystitis  Discussed plan of care with attending MD, recommend perc chole tube given multiple co-morbid conditions listed below.   Imaging reviewed with Dr Loreta Ave: He approves procedure--percutaneous cholecystostomy drain placement   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   Dementia    Diabetes mellitus without complication    ESRD (end stage renal disease) on dialysis    Heart failure    HTN (hypertension)    PEG (percutaneous endoscopic gastrostomy) status    Renal disorder    Thyroid disease     Past Surgical History:  Procedure Laterality Date   ABDOMINAL HYSTERECTOMY      Allergies: Patient has no known allergies.  Medications: Prior to Admission medications   Medication Sig Start Date End Date Taking? Authorizing Provider  amLODipine (NORVASC) 10 MG tablet Take 10 mg by mouth daily.   Yes [provider]  bisacodyl (DULCOLAX) 5 MG EC tablet Take 5 mg by mouth daily as needed for moderate constipation.   Yes [provider]  carvedilol (COREG)  25 MG tablet Take 25 mg by mouth 2 (two) times daily with a meal.   Yes [provider]  cholecalciferol (VITAMIN D) 1000 UNITS tablet Take 1,000 Units by mouth daily.   Yes [provider]  Cod Liver Oil 1000 MG CAPS Take 1 capsule by mouth daily.   Yes [provider]  insulin glargine (LANTUS) 100 UNIT/ML injection Inject 25 Units into the skin at bedtime.   Yes [provider]  insulin lispro (HUMALOG) 100 UNIT/ML injection Inject 19 Units into the skin 2 (two) times daily. 12 units before lunch and 7 units before supper   Yes [provider]  losartan-hydrochlorothiazide (HYZAAR) 100-25 MG per tablet Take 1 tablet by mouth daily.   Yes [provider]  traMADol (ULTRAM) 50 MG tablet Take 1 tablet (50 mg total) by mouth every 6 (six) hours as needed. 11/08/13  Yes Junious Silk, PA-C     History reviewed. No pertinent family history.  Social History   Socioeconomic History   Marital status: Married    Spouse name: Not on file   Number of children: Not on file   Years of education: Not on file   Highest education level: Not on file  Occupational History   Not on file  Tobacco Use   Smoking status: Never   Smokeless tobacco: Not on file  Substance and Sexual Activity   Alcohol use: Not Currently   Drug use: Never   Sexual activity: Not on file  Other Topics Concern   Not on file  Social  History Narrative   Not on file   Social Determinants of Health   Financial Resource Strain: Not on file  Food Insecurity: No Food Insecurity (12/12/2022)   Hunger Vital Sign    Worried About Running Out of Food in the Last Year: Never true    Ran Out of Food in the Last Year: Never true  Transportation Needs: No Transportation Needs (12/12/2022)   PRAPARE - Administrator, Civil Service (Medical): No    Lack of Transportation (Non-Medical): No  Physical Activity: Not on file  Stress: Not on file  Social Connections: Not on  file    Review of Systems: A 12 point ROS discussed and pertinent positives are indicated in the HPI above.  All other systems are negative.    Vital Signs: BP 131/68   Pulse 96   Temp 98.4 F (36.9 C)   Resp (!) 22   Ht  (1.499 m)   Wt 137 lb 2 oz (62.2 kg)   SpO2 98%   BMI 27.70 kg/m     Physical Exam Vitals reviewed.  Constitutional:      Appearance: She is obese. She is ill-appearing.  Cardiovascular:     Rate and Rhythm: Normal rate and regular rhythm.  Pulmonary:     Effort: Pulmonary effort is normal.     Breath sounds: Wheezing present.  Abdominal:     Palpations: Abdomen is soft.     Tenderness: There is abdominal tenderness.  Skin:    General: Skin is warm.  Neurological:     Comments: Spoke to [employer]--- she consents to prcedure  Psychiatric:        Behavior: Behavior normal.     Imaging: NM Hepatobiliary Liver Func  Result Date: 12/15/2022 CLINICAL DATA:  Right upper quadrant abdominal pain. Abnormal ultrasound. EXAM: NUCLEAR MEDICINE HEPATOBILIARY IMAGING TECHNIQUE: Sequential images of the abdomen were obtained out to 60 minutes following intravenous administration of radiopharmaceutical. RADIOPHARMACEUTICALS:  5.4 mCi Tc-35m  Choletec IV COMPARISON:  CT and ultrasound examinations 12/12/2022 FINDINGS: Prompt symmetric uptake in the liver and prompt excretion into the biliary tree. Activity is seen in the small early during the study. No activity was seen in the gallbladder after 1 hour of imaging. Patient received 2.5 mg morphine sulfate IV and additional 1 hour imaging was performed. The gallbladder was not visualized. Findings consistent with cystic duct obstruction. IMPRESSION: Nonvisualization of the gallbladder consistent with cystic duct obstruction. Electronically Signed   By: Rudie Meyer M.D.   On: 12/15/2022 13:21   US Abdomen Limited RUQ (LIVER/GB)  Result Date: 12/12/2022 CLINICAL DATA:  Cholelithiasis. EXAM: ULTRASOUND ABDOMEN  LIMITED RIGHT UPPER QUADRANT COMPARISON:  Same day CT abdomen pelvis. FINDINGS: Gallbladder: Gallstones are noted, measuring up to 2.7 cm in diameter. No gallbladder wall thickening visualized. A positive sonographic Eulah Pont sign was noted by sonographer. Common bile duct: Diameter: 4 mm Liver: No focal lesion identified. Within normal limits in parenchymal echogenicity. Portal vein is patent on color Doppler imaging with normal direction of blood flow towards the liver. Other: A 2.0 cm cyst is seen in the upper pole the right kidney. IMPRESSION: Cholelithiasis with a positive sonographic Murphy sign, but no gallbladder wall thickening or pericholecystic fluid. Findings are suggestive of acute cholecystitis. Electronically Signed   By: Romona Curls M.D.   On: 12/12/2022 19:15   CT ABDOMEN PELVIS WO CONTRAST  Result Date: 12/12/2022 CLINICAL DATA:  Nonlocalized abdominal pain EXAM: CT ABDOMEN AND PELVIS WITHOUT CONTRAST  TECHNIQUE: Multidetector CT imaging of the abdomen and pelvis was performed following the standard protocol without IV contrast. RADIATION DOSE REDUCTION: This exam was performed according to the departmental dose-optimization program which includes automated exposure control, adjustment of the mA and/or kV according to patient size and/or use of iterative reconstruction technique. COMPARISON:  None Available. FINDINGS: Lower chest: Heart is slightly enlarged. Large-bore catheter seen along the right side of the heart. Breathing motion at the lung bases. Mild basilar scar or atelectasis. Hepatobiliary: Dilated gallbladder with stones including a stone which may be impacted in the neck of the gallbladder. Please correlate for other signs of acute cholecystitis recommend further evaluation such as ultrasound or HIDA scan. Liver is otherwise grossly preserved on this non IV contrast exam. Pancreas: Unremarkable. No pancreatic ductal dilatation or surrounding inflammatory changes. Spleen: Normal in  size without focal abnormality. Adrenals/Urinary Tract: The adrenal glands are preserved. Mild bilateral renal atrophy. No abnormal calcification is seen within either kidney nor along the course of either ureter. Preserved contours of the urinary bladder. There is a cystic lesion along the upper pole of the right kidney measuring Hounsfield unit of less than 0 and diameter of 2.4 cm. Smaller focus more lateral as well in the right kidney measuring also Hounsfield unit of less than 0 and diameter approaching 1.5 cm. Likely both benign cysts. Specific imaging follow-up. Stomach/Bowel: Large bowel has a normal course and caliber with scattered stool. Few left-sided colonic diverticula. Normal retrocecal appendix. Small bowel is nondilated. G-tube in the stomach. Vascular/Lymphatic: IVC filter in place. Normal caliber aorta with slight atherosclerotic calcifications along the aorta and branch vessels. No specific abnormal lymph node enlargement seen in the abdomen and pelvis. Reproductive: Status post hysterectomy. No adnexal masses. Other: No free air or free fluid. Musculoskeletal: Scattered degenerative changes of the spine and pelvis. Trace retrolisthesis at T11 on T12 and anterolisthesis of L3 on L4. Degenerative changes of the pelvis. IMPRESSION: Dilated gallbladder with stones including a stone which may be impacted in the neck of the gallbladder. Acute cholecystitis is possible. Recommend further workup such as HIDA scan or ultrasound. Scattered colonic diverticula. Normal appendix. No bowel obstruction. IVC filter. Mild bilateral renal atrophy. No obstructing stones. Benign-appearing Bosniak 1 renal cysts. Enlarged heart with a large-bore catheter. Electronically Signed   By: Karen Kays M.D.   On: 12/12/2022 16:17   DG Chest 2 View  Result Date: 12/12/2022 CLINICAL DATA:  Shortness of breath. EXAM: CHEST - 2 VIEW COMPARISON:  None Available. FINDINGS: Heart is enlarged. A right subclavian dialysis  catheter is in place. The tip turns medially and may be near the mitral valve. No edema or effusion is present. No focal airspace disease is present. The lung volumes are low. IVC filter is noted. Degenerative changes are present in thoracic spine. Surgical clips are present in the left axilla. IMPRESSION: 1. Cardiomegaly without failure. 2. No acute cardiopulmonary disease. 3. Right subclavian dialysis catheter is in place. Electronically Signed   By: Marin Roberts M.D.   On: 12/12/2022 12:18    Labs:  CBC: Recent Labs    12/12/22 1917 12/13/22 0146 12/14/22 0501 12/15/22 1204  WBC 10.1 8.8 6.7 9.2  HGB 11.1* 10.1* 9.6* 10.3*  HCT 35.1* 30.2* 30.1* 30.6*  PLT 176 147* 148* 163    COAGS: Recent Labs    12/13/22 0146 12/13/22 0845 12/15/22 0124 12/15/22 1233 12/15/22 2135 12/16/22 0249  INR 1.5*  --   --   --   --   --  APTT  --    < > 40* 53* 66* 97*   < > = values in this interval not displayed.    BMP: Recent Labs    12/12/22 1917 12/13/22 0146 12/14/22 0501 12/15/22 0124 12/15/22 1204  NA 134* 131* 135  --  139  K 3.6 3.3* 3.4*  --  3.6  CL 95* 95* 103  --  102  CO2 23 24 22   --  25  GLUCOSE 166* 136* 134*  --  147*  BUN 39* 43* 50*  --  20  CALCIUM 9.7 9.1 8.4* 8.7 8.6*  CREATININE 2.60* 2.76* 3.05*  --  2.05*  GFRNONAA 19* 17* 15*  --  25*    LIVER FUNCTION TESTS: Recent Labs    12/12/22 1917 12/13/22 0146 12/14/22 0501 12/15/22 1204  BILITOT 0.5 0.4 0.3  --   AST 27 25 27   --   ALT 26 24 23   --   ALKPHOS 63 54 47  --   PROT 8.1 7.1 6.2*  --   ALBUMIN 4.2 3.7 3.2* 3.6    TUMOR MARKERS: No results for input(s): "AFPTM", "CEA", "CA199", "CHROMGRNA" in the last 8760 hours.  Assessment and Plan:  Scheduled for percutaneous cholecystomy drain placement in IR Risks and benefits discussed with the patient's daughter [name] via phone including, but not limited to bleeding, infection, gallbladder perforation, bile leak, sepsis or even  death.  All of the patient's questions were answered, patient is agreeable to proceed. Consent signed and in chart.  Thank you for this interesting consult.  I greatly enjoyed meeting Samantha [name] and look forward to participating in their care.  A copy of this report was sent to the requesting provider on this date.  Electronically Signed: Robet Leu, PA-C 12/16/2022, 1:10 PM   I spent a total of 20 Minutes    in face to face in clinical consultation, greater than 50% of which was counseling/coordinating care for perc chole drain

## 2022-12-16 NOTE — Progress Notes (Signed)
Subjective: Seen in dialysis -  no c/o's-  BP has dropped so UF had to be modified   Objective Vital signs in last 24 hours: Vitals:   12/16/22 0900 12/16/22 0930 12/16/22 0957 12/16/22 1035  BP: (!) 143/56 (!) 99/57 116/82 (!) 76/52  Pulse:  81 94 86  Resp: 16 (!) 23 (!) 21 15  Temp:      TempSrc:      SpO2: 98%  98% (!) 88%  Weight:      Height:       Weight change:   Physical Exam: General: Alert elderly female NAD pleasant Heart: RRR no MRG Lungs: CTA anteriorly nonlabored breathing Abdomen: NABS, soft NTND PEG tube left quadrant Extremities: No pedal edema  dialysis Access: TDC cath dry intact  OP HD = orders that were entered for Adm  Farm/SW=3 hours 15 minutes- BFR 350 TDC- heparin 1500 units then 750 mid run   Problem/Plan: Abdominal pain workup for cholestasis= surgery involved, has PEG , HIDA positive-  awaiting surgical plan ESRD -HD in hospital MWF (moved from [place]- has not officially started HD at AF/SW) prior 3 hours 15 minutes as outpatient order will use 3.5 hrs  for now follow-up labs/noted palliative care seeing with family meeting with 4 children today  appreciate help /?not sure why she never had an AVF placed hold any scheduling until further palliative talks.  So family optimistic even though she was bedbound and in SNF in [place]-  will need to talk to them about access-  if we are going to do this we need to do it right  -  when sending out would do 3:45 HTN/volume -mild volume on exam, no BP meds given here- according to list  on 4 meds including HCTZ- to simplify upon discharge- first order of business is to get to EDW then she may not need BP meds, hopefully can have standing weights at dialysis, attempt 2 to 2.5 L UF -  BP dropped so likely have hit EDW Anemia -Hgb 9.6<11.1  /Sat. Ratio 90/Ferritin 3281 / Aranesp 60,mcg  given Mon 4/22  Secondary hyperparathyroidism -phosphorus was less than 1 getting supplement, calcium corrected stable, PTH  pending Nutrition-ALB 3.2, on Osmolite supplement /and noted on regular diet needs fluid restriction with ESRD, also has PEG with history of CVA?  Swallowing issue/plan per admit  Annie Sable, MD 12/16/2022    Labs: Basic Metabolic Panel: Recent Labs  Lab 12/13/22 0146 12/14/22 0501 12/15/22 0124 12/15/22 1204  NA 131* 135  --  139  K 3.3* 3.4*  --  3.6  CL 95* 103  --  102  CO2 24 22  --  25  GLUCOSE 136* 134*  --  147*  BUN 43* 50*  --  20  CREATININE 2.76* 3.05*  --  2.05*  CALCIUM 9.1 8.4*  --  8.6*  PHOS 2.1*  --  <1.0* 3.1   Liver Function Tests: Recent Labs  Lab 12/12/22 1917 12/13/22 0146 12/14/22 0501 12/15/22 1204  AST --   ALT --   ALKPHOS 63 54 47  --   BILITOT 0.5 0.4 0.3  --   PROT 8.1 7.1 6.2*  --   ALBUMIN 4.2 3.7 3.2* 3.6   No results for input(s): "LIPASE", "AMYLASE" in the last 168 hours. No results for input(s): "AMMONIA" in the last 168 hours. CBC: Recent Labs  Lab 12/12/22 1917 12/13/22 0146 12/14/22 0501 12/15/22 1204  WBC 10.1 8.8 6.7 9.2  NEUTROABS 6.1 5.2  --   --   HGB 11.1* 10.1* 9.6* 10.3*  HCT 35.1* 30.2* 30.1* 30.6*  MCV 92.9 90.4 94.1 91.3  PLT 176 147* 148* 163   Cardiac Enzymes: No results for input(s): "CKTOTAL", "CKMB", "CKMBINDEX", "TROPONINI" in the last 168 hours. CBG: Recent Labs  Lab 12/15/22 1222 12/15/22 1729 12/15/22 1941 12/15/22 2322 12/16/22 0333  GLUCAP 150* 169* 214* 158* 154*    Studies/Results: NM Hepatobiliary Liver Func  Result Date: 12/15/2022 CLINICAL DATA:  Right upper quadrant abdominal pain. Abnormal ultrasound. EXAM: NUCLEAR MEDICINE HEPATOBILIARY IMAGING TECHNIQUE: Sequential images of the abdomen were obtained out to 60 minutes following intravenous administration of radiopharmaceutical. RADIOPHARMACEUTICALS:  5.4 mCi Tc-11m  Choletec IV COMPARISON:  CT and ultrasound examinations 12/12/2022 FINDINGS: Prompt symmetric uptake in the liver and prompt excretion  into the biliary tree. Activity is seen in the small early during the study. No activity was seen in the gallbladder after 1 hour of imaging. Patient received 2.5 mg morphine sulfate IV and additional 1 hour imaging was performed. The gallbladder was not visualized. Findings consistent with cystic duct obstruction. IMPRESSION: Nonvisualization of the gallbladder consistent with cystic duct obstruction. Electronically Signed   By: Rudie Meyer M.D.   On: 12/15/2022 13:21   Medications:  cefTRIAXone (ROCEPHIN)  IV 2 g (12/15/22 1201)   heparin Stopped (12/16/22 0754)   metronidazole 500 mg (12/15/22 2247)    Chlorhexidine Gluconate Cloth  6 each Topical Q0600   darbepoetin (ARANESP) injection - DIALYSIS  60 mcg Subcutaneous Q Wed-1800   feeding supplement (OSMOLITE 1.5 CAL)  1,000 mL Per Tube Q24H   insulin aspart  0-6 Units Subcutaneous TID WC   insulin glargine-yfgn  10 Units Subcutaneous QHS   multivitamin  1 tablet Oral QHS

## 2022-12-16 NOTE — Procedures (Signed)
Patient was seen on dialysis and the procedure was supervised.  BFR 400  Via TDC BP is  103/61.   Patient appears to be tolerating treatment well  Cecille Aver 12/16/2022

## 2022-12-16 NOTE — Progress Notes (Signed)
Per MD order, feeding started at rate 20 ml/h, will increase by 10 ml/h every shift until it reach the goal.

## 2022-12-16 NOTE — Procedures (Addendum)
Interventional Radiology Procedure Note  Procedure: Image guided drain placement, perc chole.  72F pigtail drain.  Complications: None  EBL: None Sample: Culture sent  Recommendations: - Routine drain care, record output - follow up Cx - routine wound care - ok to advance diet per primary order - ok to restart AC as needed  Signed,  Yvone Neu. Loreta Ave, DO

## 2022-12-16 NOTE — Progress Notes (Signed)
[facility] Progress Note     Subjective: CC:  Seen in HD. Denies abdominal pain currently. Denies emesis. Denies a history of abdominal surgery.  Objective: Vital signs in last 24 hours: Temp:  [97.6 F (36.4 C)-98.3 F (36.8 C)] 98.3 F (36.8 C) (04/24 0745) Pulse Rate:  [79-100] 90 (04/24 1135) Resp:  [15-23] 21 (04/24 1135) BP: (76-159)/(45-105) 105/45 (04/24 1135) SpO2:  [88 %-100 %] 88 % (04/24 1035) Last BM Date : 12/15/22  Intake/Output from previous day: No intake/output data recorded. Intake/Output this shift: No intake/output data recorded.  PE: Gen:  Alert, NAD, pleasant Card:  Regular rate and rhythm, pedal pulses 2+ BL Pulm:  Normal effort, clear to auscultation bilaterally Abd: Soft, non-tender, mild distention, no palpable hernias. Skin: warm and dry, no rashes  Psych: A&Ox2   Lab Results:  Recent Labs    12/14/22 0501 12/15/22 1204  WBC 6.7 9.2  HGB 9.6* 10.3*  HCT 30.1* 30.6*  PLT 148* 163   BMET Recent Labs    12/14/22 0501 12/15/22 1204  NA 135 139  K 3.4* 3.6  CL 103 102  CO2 22 25  GLUCOSE 134* 147*  BUN 50* 20  CREATININE 3.05* 2.05*  CALCIUM 8.4* 8.6*   PT/INR No results for input(s): "LABPROT", "INR" in the last 72 hours. CMP     Component Value Date/Time   NA 139 12/15/2022 1204   K 3.6 12/15/2022 1204   CL 102 12/15/2022 1204   CO2 25 12/15/2022 1204   GLUCOSE 147 (H) 12/15/2022 1204   BUN 20 12/15/2022 1204   CREATININE 2.05 (H) 12/15/2022 1204   CALCIUM 8.6 (L) 12/15/2022 1204   PROT 6.2 (L) 12/14/2022 0501   ALBUMIN 3.6 12/15/2022 1204   AST 27 12/14/2022 0501   ALT 23 12/14/2022 0501   ALKPHOS 47 12/14/2022 0501   BILITOT 0.3 12/14/2022 0501   GFRNONAA 25 (L) 12/15/2022 1204   Lipase  No results found for: "LIPASE"     Studies/Results: NM Hepatobiliary Liver Func  Result Date: 12/15/2022 CLINICAL DATA:  Right upper quadrant abdominal pain. Abnormal ultrasound. EXAM: NUCLEAR MEDICINE  HEPATOBILIARY IMAGING TECHNIQUE: Sequential images of the abdomen were obtained out to 60 minutes following intravenous administration of radiopharmaceutical. RADIOPHARMACEUTICALS:  5.4 mCi Tc-84m  Choletec IV COMPARISON:  CT and ultrasound examinations 12/12/2022 FINDINGS: Prompt symmetric uptake in the liver and prompt excretion into the biliary tree. Activity is seen in the small early during the study. No activity was seen in the gallbladder after 1 hour of imaging. Patient received 2.5 mg morphine sulfate IV and additional 1 hour imaging was performed. The gallbladder was not visualized. Findings consistent with cystic duct obstruction. IMPRESSION: Nonvisualization of the gallbladder consistent with cystic duct obstruction. Electronically Signed   By: Rudie Meyer M.D.   On: 12/15/2022 13:21    Anti-infectives: Anti-infectives (From admission, onward)    Start     Dose/Rate Route Frequency Ordered Stop   12/13/22 0800  cefTRIAXone (ROCEPHIN) 2 g in sodium chloride 0.9 % 100 mL IVPB        2 g 200 mL/hr over 30 Minutes Intravenous Every 24 hours 12/13/22 0753     12/13/22 0800  metroNIDAZOLE (FLAGYL) IVPB 500 mg        500 mg 100 mL/hr over 60 Minutes Intravenous Every 12 hours 12/13/22 0753          Assessment/Plan Acute cholecystitis  Discussed plan of care with attending MD, recommend perc chole tube  given multiple co-morbid conditions listed below.    ESRD on HD MWF DVT s/p IVC filter on DOAC at home  Hx CVA with L hemiparesis, dysphagia, and failure to thrive  DM2  HTN Normocytic anemia  Secondary hyperparathyroidis  Code status: full code   LOS: 4 days   I reviewed nursing notes, Consultant nephrology, palliative notes, hospitalist notes, last 24 h vitals and pain scores, last 48 h intake and output, last 24 h labs and trends, and last 24 h imaging results.  This care required moderate level of medical decision making.   Hosie Spangle, PA-C [facility] Please see Amion for pager number during day hours 7:00am-4:30pm

## 2022-12-16 NOTE — Progress Notes (Signed)
   12/16/22 1255  Vitals  Temp 98.4 F (36.9 C)  Pulse Rate 96  Resp (!) 22  BP 131/68  SpO2 98 %  O2 Device Room Air  Weight 62.2 kg  Type of Weight Post-Dialysis  During Treatment Monitoring  Intra-Hemodialysis Comments Tx completed   Received patient in bed to unit.  Alert and oriented.  Informed consent signed and in chart.   TX duration:  Patient tolerated well.  Transported back to the room  Alert, without acute distress.  Hand-off given to patient's nurse.   Access used: Yes Access issues: No  Total UF removed: 1300 Medication(s) given: Heparin 1500 units, and Heparin 4500 units per HD catheter Post HD VS: See Above Grid Post HD weight: 62.2 kg   [facility]

## 2022-12-16 NOTE — Progress Notes (Signed)
ANTICOAGULATION CONSULT NOTE - Follow Up Consult  Pharmacy Consult for Heparin Indication: atrial fibrillation, hx DVT at previous [facility] site in Feb 2024  No Known Allergies  Patient Measurements: Height:  (149.9 cm) Weight: 62.2 kg (137 lb 2 oz) IBW/kg (Calculated) : 43.2 Heparin Dosing Weight: 56 kg  Vital Signs: Temp: 98.4 F (36.9 C) (04/24 1255) Temp Source: Oral (04/24 0745) BP: 131/68 (04/24 1255) Pulse Rate: 96 (04/24 1255)  Labs: Recent Labs    12/14/22 0501 12/14/22 1435 12/15/22 0124 12/15/22 1204 12/15/22 1233 12/15/22 2135 12/16/22 0249  HGB 9.6*  --   --  10.3*  --   --   --   HCT 30.1*  --   --  30.6*  --   --   --   PLT 148*  --   --  163  --   --   --   APTT 29   < > 40*  --  53* 66* 97*  HEPARINUNFRC >1.10*  --  0.90*  --  0.79*  --  0.84*  CREATININE 3.05*  --   --  2.05*  --   --   --    < > = values in this interval not displayed.    Estimated Creatinine Clearance: 18.7 mL/min (A) (by C-G formula based on SCr of 2.05 mg/dL (H)).   Assessment: 77 yo female admitted with acute cholecystitis on chronic Eliquis for hx DVT in previous [facility] site in Feb 2024. Last dose taken 4/19 in the PM per patient report. Pharmacy asked to start IV heparin while Eliquis on hold for possible need for surgery/procedure.  aPTT therapeutic (97 seconds) at ~3am on 800 units/hr.  Heparin level remains falsely elevated (0.84) due to recent Eliquis doses. Heparin drip held at ~8am today with plans for percutaneous chole tube placement.  No CBC today.  Goal of Therapy:  Heparin level 0.3-0.7 units/ml aPTT 66-102 seconds Monitor platelets by anticoagulation protocol: Yes   Plan:  Heparin drip at 800 units/hr held ~8am today for procedure. Will follow up post-procedure for anticoagulation plans. Daily aPTT, heparin level and CBC while on heparin. Eliquis on hold.  Dennie Fetters, RPh 12/16/2022,1:17 PM

## 2022-12-16 NOTE — TOC Initial Note (Signed)
Transition of Care [facility]) - Initial/Assessment Note    Patient Details  Name: Samantha [name] MRN: [id number] Date of Birth: 10-08-1945  Transition of Care [facility]) CM/SW Contact:    Mearl Latin, LCSW Phone Number: 12/16/2022, 8:56 AM  Clinical Narrative:                 CSW received return call from patient's daughter. She reported that they just brought patient to [place] over the weekend and she ended up at the hospital right away. She is requesting facility placement as patient was in a facility in [place]. She is in touch with a Medicaid worker who explained she will need to apply for [place] Medicaid in order for it to get switched over from [place]. CSW discussed insurance authorization process and will provide Medicare SNF ratings list. CSW will send out referrals for review and provide bed offers as available though barriers include need for long term care and dialysis transportation. If insurance does not approve rehab for patient, family will need to pay privately until her [place] Medicaid is approved.   Skilled Nursing Rehab Facilities-   [email or web address]   Ratings out of 5 stars (5 the highest)   Name Address  Phone # Quality Care Staffing Health Inspection Overall  [facility] [address] [place] [phone] 5 2 3   [facility]  [address], [place] [phone] 4 2 5 5   [facility] [address] [place], [place] [phone] 1 3 1 1   [facility] [address] [phone] 2 2 4 4   [facility] [address], [place] [phone] 2 1 2 1   [facility] [address], [place] [phone] 3 3 4 4   [facility] [address], [place] [phone] 4 1 3 2   [facility] [address], [place] [phone] 4 1 3 2   [address] ([facility]) [address], [place] [phone] 3 1 2 1   [facility] [phone] [address], [place] Otto [phone] 3 1 1 1    [facility] [address] [facility] [phone] 3 2 2 2   [facility] [address], [place] [phone] 3 1 1 1   [facility]igh[facility] [address] [place] Shi [phone] 4 2 4 4           [facility] [address] [place] [phone]      [facility] [address] [place] [phone] 4 1 3 2   [facility] [address] [place] Ditto [phone] 3 1 5 4   [facility], [place] [address], [place] [phone] 1 1 2 1   [facility] [address], [employer] [phone] 2 2 4 4           [address] (no [facility]) [address], [place] [phone] 5 5 5 5   [facility] (No Hum[employer]) [address] [place] [address] [place] [phone] 4 1 4 3   [facility] (No UHC) [address], [place] [phone] 5 5 5 5   [facility] [address], [employer] [phone] 2 3 5 5   [facility] [address], [place] [phone] 1 1 2 1   [facility] [address] [place] [phone] 3 1 1 1   [place] [address] Liliane Shi [phone] 5 2 5 5   [facility]  [address] [place] [phone] 2 2 1 1   [facility] [address] [place] [phone] 3 2 1 1   [facility] [address], [place] [phone] [facility] [address] [place] [phone] [facility] [address] Evlyn Clines  [phone] [facility] [address], [place]al[place] Levan [phone] [facility] [address] [place] [phone] [facility] (No Humana) [address] [place] [phone] [facility]) [address], [place]alita Levan [phone] [facility] [address] [place], [place] [phone] [facility])  [address] [place] [phone] [facility]) [address] [place] [phone] [facility] [address], [place] [phone] [address] [place], [place] [phone] [facility]) [address] [place] [phone] Expected Discharge Plan: Skilled Nursing Facility Barriers to Discharge: Continued Medical Work up, SNF Pending bed offer, Insurance Authorization (Does not have [place] Medicaid yet and likely needs long term)   Patient Goals and CMS Choice Patient states their goals for this hospitalization and ongoing recovery are:: Facility placement CMS Medicare.gov Compare Post Acute Care list provided to:: Patient Represenative (must comment) Choice offered to / list presented to : Adult Children [place] ownership interest in [facility].provided to:: Adult Children    Expected Discharge Plan and Services In-house Referral: Clinical Social Work   Post Acute Care Choice: Skilled Nursing Facility Living arrangements for the past 2 months: Skilled Nursing Facility                                      Prior Living Arrangements/Services Living arrangements for the past 2 months: Skilled Nursing Facility Lives with:: Facility Resident Patient language and need for interpreter reviewed:: Yes Do you feel safe going back to the place where you live?: Yes      Need for Family Participation in Patient Care: Yes (Comment) Care giver support system in place?: Yes (comment)   Criminal Activity/Legal Involvement Pertinent to Current Situation/Hospitalization: No - Comment as needed  Activities of Daily Living Home Assistive Devices/Equipment: Wheelchair ADL Screening (condition at time of admission) Patient's cognitive ability adequate to safely complete daily activities?: Yes Is the patient deaf or have difficulty hearing?: No Does the patient have difficulty seeing, even when wearing glasses/contacts?: No Does the  patient have difficulty concentrating, remembering, or making decisions?: No Patient able to express need for assistance with ADLs?: Yes Does the patient have difficulty dressing or bathing?: Yes Independently performs ADLs?: No Communication: Independent Dressing (OT): Needs assistance Is this a change from baseline?: Pre-admission baseline Grooming: Needs assistance Is this a change from baseline?: Pre-admission baseline Feeding: Independent Bathing: Needs assistance Is this a change from baseline?: Pre-admission baseline Toileting: Needs assistance Is this a change from baseline?: Pre-admission baseline In/Out Bed: Needs assistance Is this a change from baseline?: Pre-admission baseline Walks in Home: Needs assistance Is this a change from baseline?: Pre-admission baseline Does the patient have difficulty walking or climbing stairs?: Yes Weakness of Legs: None Weakness of Arms/Hands: None  Permission Sought/Granted Permission sought to  share information with : Facility Medical sales representative, Family Supports Permission granted to share information with : No  Share Information with NAME: Joclyn  Permission granted to share info w AGENCY: SNFs  Permission granted to share info w Relationship: Daughter  Permission granted to share info w Contact Information: [phone]  Emotional Assessment Appearance:: Appears stated age Attitude/Demeanor/Rapport: Unable to Assess Affect (typically observed): Unable to Assess Orientation: : Oriented to Self, Oriented to Place Alcohol / Substance Use: Not Applicable Psych Involvement: No (comment)  Admission diagnosis:  Cardiomegaly [I51.7] Acute cholecystitis [K81.0] ESRD (end stage renal disease) on dialysis [N18.6, Z99.2] Patient Active Problem List   Diagnosis Date Noted   Epigastric pain 12/13/2022   End-stage renal disease on hemodialysis 12/13/2022   Failure to thrive in adult 12/13/2022   DM2 (diabetes mellitus, type 2)  12/13/2022   Essential hypertension 12/13/2022   History of DVT (deep vein thrombosis) 12/13/2022   CVA (cerebral vascular accident) 12/12/2022   Acute cholecystitis 12/12/2022   PCP:  Patient, No Pcp Per Pharmacy:   [facility] [address], [place] - [address] [place] [address] Phone: [phone] Fax: [phone]     Social Determinants of Health (SDOH) Social History: SDOH Screenings   Food Insecurity: No Food Insecurity (12/12/2022)  Housing: Low Risk  (12/12/2022)  Transportation Needs: No Transportation Needs (12/12/2022)  Utilities: Not At Risk (12/12/2022)  Tobacco Use: Unknown (12/12/2022)   SDOH Interventions:     Readmission Risk Interventions     No data to display

## 2022-12-17 DIAGNOSIS — N186 End stage renal disease: Secondary | ICD-10-CM | POA: Diagnosis not present

## 2022-12-17 DIAGNOSIS — R1013 Epigastric pain: Secondary | ICD-10-CM | POA: Diagnosis not present

## 2022-12-17 DIAGNOSIS — K810 Acute cholecystitis: Secondary | ICD-10-CM | POA: Diagnosis not present

## 2022-12-17 DIAGNOSIS — E119 Type 2 diabetes mellitus without complications: Secondary | ICD-10-CM | POA: Diagnosis not present

## 2022-12-17 LAB — RENAL FUNCTION PANEL
Albumin: 3.1 g/dL — ABNORMAL LOW (ref 3.5–5.0)
Anion gap: 12 (ref 5–15)
BUN: 16 mg/dL (ref 8–23)
CO2: 28 mmol/L (ref 22–32)
Calcium: 8.5 mg/dL — ABNORMAL LOW (ref 8.9–10.3)
Chloride: 97 mmol/L — ABNORMAL LOW (ref 98–111)
Creatinine, Ser: 2.34 mg/dL — ABNORMAL HIGH (ref 0.44–1.00)
GFR, Estimated: 21 mL/min — ABNORMAL LOW (ref >60)
Glucose, Bld: 141 mg/dL — ABNORMAL HIGH (ref 70–99)
Phosphorus: 2.5 mg/dL (ref 2.5–4.6)
Potassium: 3.8 mmol/L (ref 3.5–5.1)
Sodium: 137 mmol/L (ref 135–145)

## 2022-12-17 LAB — CBC
HCT: 26.1 % — ABNORMAL LOW (ref 36.0–46.0)
Hemoglobin: 8.6 g/dL — ABNORMAL LOW (ref 12.0–15.0)
MCH: 30.8 pg (ref 26.0–34.0)
MCHC: 33 g/dL (ref 30.0–36.0)
MCV: 93.5 fL (ref 80.0–100.0)
Platelets: 133 10*3/uL — ABNORMAL LOW (ref 150–400)
RBC: 2.79 MIL/uL — ABNORMAL LOW (ref 3.87–5.11)
RDW: 19.4 % — ABNORMAL HIGH (ref 11.5–15.5)
WBC: 9.6 10*3/uL (ref 4.0–10.5)
nRBC: 0.3 % — ABNORMAL HIGH (ref 0.0–0.2)

## 2022-12-17 LAB — GLUCOSE, CAPILLARY
Glucose-Capillary: 143 mg/dL — ABNORMAL HIGH (ref 70–99)
Glucose-Capillary: 132 mg/dL — ABNORMAL HIGH (ref 70–99)
Glucose-Capillary: 120 mg/dL — ABNORMAL HIGH (ref 70–99)
Glucose-Capillary: 112 mg/dL — ABNORMAL HIGH (ref 70–99)
Glucose-Capillary: 209 mg/dL — ABNORMAL HIGH (ref 70–99)

## 2022-12-17 LAB — APTT: aPTT: 69 seconds — ABNORMAL HIGH (ref 24–36)

## 2022-12-17 LAB — HEPARIN LEVEL (UNFRACTIONATED): Heparin Unfractionated: 0.51 IU/mL (ref 0.30–0.70)

## 2022-12-17 NOTE — Progress Notes (Signed)
Referring Physician(s): Hosie Spangle  Supervising Physician: Pernell Dupre  Patient Status:  [facility] - In[facility]pt  Chief Complaint:  1 day s/p perc chole drain placement   Subjective:  Pt resting in bed. She states she is feeling better today. She denies abd pain, N/V. She is eating/drinking normally. She endorses mild tenderness to insertion site.   Allergies: Patient has no known allergies.  Medications: Prior to Admission medications   Medication Sig Start Date End Date Taking? Authorizing Provider  amLODipine (NORVASC) 10 MG tablet Take 10 mg by mouth daily.   Yes [provider]  bisacodyl (DULCOLAX) 5 MG EC tablet Take 5 mg by mouth daily as needed for moderate constipation.   Yes [provider]  carvedilol (COREG) 25 MG tablet Take 25 mg by mouth 2 (two) times daily with a meal.   Yes [provider]  cholecalciferol (VITAMIN D) 1000 UNITS tablet Take 1,000 Units by mouth daily.   Yes [provider]  Cod Liver Oil 1000 MG CAPS Take 1 capsule by mouth daily.   Yes [provider]  insulin glargine (LANTUS) 100 UNIT/ML injection Inject 25 Units into the skin at bedtime.   Yes [provider]  insulin lispro (HUMALOG) 100 UNIT/ML injection Inject 19 Units into the skin 2 (two) times daily. 12 units before lunch and 7 units before supper   Yes [provider]  losartan-hydrochlorothiazide (HYZAAR) 100-25 MG per tablet Take 1 tablet by mouth daily.   Yes [provider]  traMADol (ULTRAM) 50 MG tablet Take 1 tablet (50 mg total) by mouth every 6 (six) hours as needed. 11/08/13  Yes Junious Silk, PA-C     Vital Signs: BP (!) 132/48 (BP Location: Left Leg)   Pulse 100   Temp 99.6 F (37.6 C) (Oral)   Resp 18   Ht  (1.499 m)   Wt 144 lb 6.4 oz (65.5 kg)   SpO2 100%   BMI 29.17 kg/m   Physical Exam Vitals reviewed.  Constitutional:      General: She is not in acute distress.     Appearance: She is ill-appearing.  HENT:     Head: Normocephalic and atraumatic.  Pulmonary:     Effort: Pulmonary effort is normal. No respiratory distress.  Abdominal:     Comments: Drain flushes/aspirates easily. Site unremarkable with sutures/statlock in place. ~ 100 cc clear,yellow OP in gravity bag. Dressing C/D/I.    Skin:    General: Skin is warm and dry.  Neurological:     Mental Status: She is alert.  Psychiatric:        Mood and Affect: Mood normal.        Behavior: Behavior normal.        Thought Content: Thought content normal.        Judgment: Judgment normal.     Imaging: IR Perc Cholecystostomy  Result Date: 12/16/2022 INDICATION: 77 year old female presents for cholecystostomy EXAM: CHOLECYSTOSTOMY MEDICATIONS: 2 g Mefoxin; The antibiotic was administered within an appropriate time frame prior to the initiation of the procedure. ANESTHESIA/SEDATION: Moderate (conscious) sedation was employed during this procedure. A total of Versed 0.0 mg and Fentanyl 70 mcg was administered intravenously. Moderate Sedation Time: 10 minutes. The patient's level of consciousness and vital signs were monitored continuously by radiology nursing throughout the procedure under my direct supervision. FLUOROSCOPY TIME:  Fluoroscopy Time: 0 minutes 6 seconds (4 mGy). COMPLICATIONS: None PROCEDURE: Informed written consent was obtained from  the patient and the patient's family after a thorough discussion of the procedural risks, benefits and alternatives. All questions were addressed. Maximal Sterile Barrier Technique was utilized including caps, mask, sterile gowns, sterile gloves, sterile drape, hand hygiene and skin antiseptic. A timeout was performed prior to the initiation of the procedure. Ultrasound survey of the right upper quadrant was performed for planning purposes. Once the patient is prepped and draped in the usual sterile fashion, the skin and subcutaneous tissues overlying the  gallbladder were generously infiltrated 1% lidocaine for local anesthesia. A coaxial needle was advanced under ultrasound guidance through the skin subcutaneous tissues and a small segment of liver into the gallbladder lumen. With removal of the stylet, spontaneous dark bile drainage occurred. Using modified Seldinger technique, a 10 French drain was placed into the gallbladder fossa, with aspiration of the sample for the lab. Contrast injection confirmed position of the tube within the gallbladder lumen. Drainage catheter was attached to gravity drain with a suture retention placed. Patient tolerated the procedure well and remained hemodynamically stable throughout. No complications were encountered and no significant blood loss encountered. IMPRESSION: Status post image guided percutaneous cholecystostomy Signed, Yvone Neu. Miachel Roux, RPVI [facility] Electronically Signed   By: Gilmer Mor D.O.   On: 12/16/2022 17:29   NM Hepatobiliary Liver Func  Result Date: 12/15/2022 CLINICAL DATA:  Right upper quadrant abdominal pain. Abnormal ultrasound. EXAM: NUCLEAR MEDICINE HEPATOBILIARY IMAGING TECHNIQUE: Sequential images of the abdomen were obtained out to 60 minutes following intravenous administration of radiopharmaceutical. RADIOPHARMACEUTICALS:  5.4 mCi Tc-9m  Choletec IV COMPARISON:  CT and ultrasound examinations 12/12/2022 FINDINGS: Prompt symmetric uptake in the liver and prompt excretion into the biliary tree. Activity is seen in the small early during the study. No activity was seen in the gallbladder after 1 hour of imaging. Patient received 2.5 mg morphine sulfate IV and additional 1 hour imaging was performed. The gallbladder was not visualized. Findings consistent with cystic duct obstruction. IMPRESSION: Nonvisualization of the gallbladder consistent with cystic duct obstruction. Electronically Signed   By: Rudie Meyer M.D.   On:  12/15/2022 13:21    Labs:  CBC: Recent Labs    12/13/22 0146 12/14/22 0501 12/15/22 1204 12/17/22 0428  WBC 8.8 6.7 9.2 9.6  HGB 10.1* 9.6* 10.3* 8.6*  HCT 30.2* 30.1* 30.6* 26.1*  PLT 147* 148* 163 133*    COAGS: Recent Labs    12/13/22 0146 12/13/22 0845 12/15/22 1233 12/15/22 2135 12/16/22 0249 12/17/22 0428  INR 1.5*  --   --   --   --   --   APTT  --    < > 53* 66* 97* 69*   < > = values in this interval not displayed.    BMP: Recent Labs    12/13/22 0146 12/14/22 0501 12/15/22 0124 12/15/22 1204 12/17/22 0428  NA 131* 135  --  139 137  K 3.3* 3.4*  --  3.6 3.8  CL 95* 103  --  102 97*  CO2 24 22  --  25 28  GLUCOSE 136* 134*  --  147* 141*  BUN 43* 50*  --  20 16  CALCIUM 9.1 8.4* 8.7 8.6* 8.5*  CREATININE 2.76* 3.05*  --  2.05* 2.34*  GFRNONAA 17* 15*  --  25* 21*    LIVER FUNCTION TESTS: Recent Labs    12/12/22 1917 12/13/22 0146 12/14/22 0501 12/15/22 1204 12/17/22 0428  BILITOT 0.5 0.4 0.3  --   --  AST 27 25 27   --   --   ALT 26 24 23   --   --   ALKPHOS 63 54 47  --   --   PROT 8.1 7.1 6.2*  --   --   ALBUMIN 4.2 3.7 3.2* 3.6 3.1*    Assessment and Plan:  Pt 1 day s/p perc chole drain placement with Dr. Loreta Ave, IR.  Pt resting in bed. She is A&O, calm and pleasant.  She is in no distress.    WBC WNL  VSS, Tmax 99.6  Culture pending   Continue TID flushes with 5 cc NS. Record output Q shift. Dressing changes QD or PRN if soiled.  Call IR APP or on call IR MD if difficulty flushing or sudden change in drain output.  Repeat imaging/possible drain injection once output < 10 mL/QD (excluding flush material.)   Discharge planning: Please contact IR APP or on call IR MD prior to patient d/c to ensure appropriate follow up plans are in place. Typically patient will follow up with IR clinic 10-14 days post d/c for repeat imaging/possible drain injection. IR scheduler will contact patient with date/time of appointment. Patient will  need to flush drain QD with 5 cc NS, record output QD, dressing changes every 2-3 days or earlier if soiled.    IR will continue to follow - please call with questions or concerns.     Electronically Signed: Shon Hough, NP 12/17/2022, 2:46 PM   I spent a total of 15 Minutes at the the patient's bedside AND on the patient's hospital floor or unit, greater than 50% of which was counseling/coordinating care for acute cholecystitis.

## 2022-12-17 NOTE — Progress Notes (Addendum)
Nutrition Follow-up  DOCUMENTATION CODES:   Not applicable  INTERVENTION:   Enteral nutrition via PEG tube: Osmolite 1.5 at 83 mL/hr x 12 hours from 1800 to 0600 Tube feeds at goal provided 1500 kcal, 63 gm protein, and 762 mL free water daily.  Renal Multivitamin w/ minerals daily Meal ordering with assist Assistance with setting up meal tray  Liberalize pt diet to regular due to pt on standard TF formula and electrolytes within normal range.  Continue 1200 mL fluid restriction Continue to monitor labs   NUTRITION DIAGNOSIS:   Increased nutrient needs related to acute illness as evidenced by estimated needs. - Ongoing  GOAL:   Patient will meet greater than or equal to 90% of their needs - Ongoing  MONITOR:   PO intake, Labs, TF tolerance, I & O's  REASON FOR ASSESSMENT:   Consult Enteral/tube feeding initiation and management, Assessment of nutrition requirement/status  ASSESSMENT:   77 y.o. female presented to the ED with abdominal pain. PMH includes ESRD on HD (MWF), FTT w/ PEG tube, CVA w/ L-side residual weakness, T2DM, HTN, and DVT. Pt admitted with abdominal pain and possible cholecystitis.  4/24 - NPO; cholecystostomy drain placed; diet advanced to full liquids 4/25 - diet advanced to renal with 1200 mL FR  Pt laying in bed, no family at bedside. Pt reports that she is eating well. Denies any nausea or vomiting. Pt told me she had dialysis today.  Per notes, pt has been bedbound for > 2 years.   MD was able to speak with family after RD visit. Home tube feeds includes Nepro overnight. MD would like to continue with nocturnal feeds. Was able to receive feeds last night.   Pt continues on standard TF formula and electrolytes remain within normal range.   Medications reviewed and include: NovoLog 0-6 units TID, Semglee, Rena-vit, IV antibiotics  Labs reviewed: Sodium 137, Potassium 3.8, Phosphorus 2.5, Magnesium 2.2, 24 hr CBGs 120-168  HD on 4/24 EDW: 64  kg Net UF: 1300 mL  Diet Order:   Diet Order             Diet regular Room service appropriate? Yes with Assist; Fluid consistency: Thin; Fluid restriction: 1200 mL Fluid  Diet effective now                   EDUCATION NEEDS:   Not appropriate for education at this time  Skin:  Skin Assessment: Reviewed RN Assessment  Last BM:  4/23  Height:  Ht Readings from Last 1 Encounters:  12/12/22  (1.499 m)   Weight:  Wt Readings from Last 1 Encounters:  12/17/22 65.5 kg   BMI:  Body mass index is 29.17 kg/m.  Estimated Nutritional Needs:  Kcal:  1700-1900 Protein:  85-100 grams Fluid:  1L + UOP   Kirby Crigler RD, LDN Clinical Dietitian See [facility] for contact information.

## 2022-12-17 NOTE — Progress Notes (Signed)
ANTICOAGULATION CONSULT NOTE - Follow Up Consult  Pharmacy Consult for Heparin Indication: atrial fibrillation, hx DVT at previous [facility] site in Feb 2024  No Known Allergies  Patient Measurements: Height:  (149.9 cm) Weight: 65.5 kg (144 lb 6.4 oz) IBW/kg (Calculated) : 43.2 Heparin Dosing Weight: 56 kg  Vital Signs: Temp: 99.6 F (37.6 C) (04/25 1139) Temp Source: Oral (04/25 1139) BP: 132/48 (04/25 1139) Pulse Rate: 100 (04/25 1139)  Labs: Recent Labs    12/15/22 1204 12/15/22 1233 12/15/22 2135 12/16/22 0249 12/17/22 0428  HGB 10.3*  --   --   --  8.6*  HCT 30.6*  --   --   --  26.1*  PLT 163  --   --   --  133*  APTT  --  53* 66* 97* 69*  HEPARINUNFRC  --  0.79*  --  0.84* 0.51  CREATININE 2.05*  --   --   --  2.34*     Estimated Creatinine Clearance: 16.8 mL/min (A) (by C-G formula based on SCr of 2.34 mg/dL (H)).   Assessment: 77 yo female admitted with acute cholecystitis on chronic Eliquis for hx DVT in previous [facility] site in Feb 2024. Last dose taken 4/19 in the PM per patient report. Pharmacy asked to start IV heparin while Eliquis on hold for possible need for surgery/procedure.  Heparin drip held at 4/24 am for percutaneous chole tube placement and resumed in pm ~5 hours post-procedure.  aPTT is therapeutic (69 seconds) and heparin level now also therapeutic (0.51) on 800 units/hr. Heparin levels previously falsely elevated due to recent Eliquis doses.  CBC has trended down. No bleeding reported. Using [facility] for hemodialysis. Permanent access pending.  Goal of Therapy:  Heparin level 0.3-0.7 units/ml aPTT 66-102 seconds Monitor platelets by anticoagulation protocol: Yes   Plan:  Continue Heparin drip at 800 units/hr. Daily aPTT and heparin level one more day. Daily CBC while on heparin. Monitor for signs/symptoms of bleeding. Eliquis on hold until all procedures completed.  Dennie Fetters, RPh 12/17/2022,1:26 PM

## 2022-12-17 NOTE — Progress Notes (Signed)
Physical Therapy Treatment Patient Details Name: Samantha [name] MRN: [id number] DOB: 01/21/1946 Today's Date: 12/17/2022   History of Present Illness 77 y.o. female admitted on 4/20 with abdominal pain and concern for acute cholecystitis.   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    PT Comments    Pt tolerated today's session well but remains limited by activity tolerance, balance, and strength. Pt's balance improved while seated EOB, utilizing back of straight leg chair for BUE support, able to maintain balance with close guard. Pt also able to maintain sitting with 1UE support while reaching for targets with BUE, returning to center prior to reaching again. Pt only tolerating ~3 minutes of EOB balance before reporting fatigue and requesting to return to supine. Pt tolerating brief BLE TherEx with AAROM required for LLE. Pt will continue to benefit from skilled acute PT to address deficits and progress mobility, discharge recommendations remain appropriate.     Recommendations for follow up therapy are one component of a multi-disciplinary discharge planning process, led by the attending physician.  Recommendations may be updated based on patient status, additional functional criteria and insurance authorization.  Follow Up Recommendations  Can patient physically be transported by private vehicle: No    Assistance Recommended at Discharge Frequent or constant Supervision/Assistance  Patient can return home with the following Two people to help with walking and/or transfers;Assist for transportation;A lot of help with bathing/dressing/bathroom   Equipment Recommendations  Other (comment) (defer to next level)    Recommendations for Other Services       Precautions / Restrictions Precautions Precautions: Fall Restrictions Weight Bearing Restrictions: No      Mobility  Bed Mobility Overal bed mobility: Needs Assistance Bed Mobility: Supine to Sit, Sit to Supine     Supine to sit: Max assist, HOB elevated Sit to supine: Max assist, HOB elevated   General bed mobility comments: maxA for BLE management and to pull into sitting, assist for trunk and BLE to return to supine and reposition. Assist required to pivot hips to EOB with bed pad    Transfers                   General transfer comment: pt fatiguing while seated EOB    Ambulation/Gait               General Gait Details: unable at this time   Stairs             Wheelchair Mobility    Modified Rankin (Stroke Patients Only)       Balance Overall balance assessment: Needs assistance Sitting-balance support: Bilateral upper extremity supported, Feet supported Sitting balance-Leahy Scale: Poor Sitting balance - Comments: reliant on 1-2 UE support to maintain sitting balance, utilizing back of chair. Pt able to release 1UE to perform reaching but unable to maintain sitting balance with UE support                                    Cognition Arousal/Alertness: Awake/alert Behavior During Therapy: WFL for tasks assessed/performed Overall Cognitive Status: No family/caregiver present to determine baseline cognitive functioning                                 General  Comments: pt with increased time for command following, pleasant throughout session but cueing needed for mobility and exercises        Exercises General Exercises - Lower Extremity Ankle Circles/Pumps: AROM, Right, AAROM, Left, 5 reps, Supine Short Arc Quad: AROM, Right, AAROM, Left, 5 reps, Supine Hip ABduction/ADduction: AROM, Right, AAROM, Left, 5 reps, Supine    General Comments General comments (skin integrity, edema, etc.): VSS on room air      Pertinent Vitals/Pain Pain Assessment Pain Assessment: Faces Faces Pain Scale: No hurt    Home Living                           Prior Function            PT Goals (current goals can now be found in the care plan section) Acute Rehab PT Goals Patient Stated Goal: get better PT Goal Formulation: With patient Time For Goal Achievement: 12/29/22 Potential to Achieve Goals: Fair Progress towards PT goals: Progressing toward goals    Frequency    Min 2X/week      PT Plan Current plan remains appropriate    Co-evaluation              AM-PAC PT "6 Clicks" Mobility   Outcome Measure  Help needed turning from your back to your side while in a flat bed without using bedrails?: A Lot Help needed moving from lying on your back to sitting on the side of a flat bed without using bedrails?: A Lot Help needed moving to and from a bed to a chair (including a wheelchair)?: Total Help needed standing up from a chair using your arms (e.g., wheelchair or bedside chair)?: Total Help needed to walk in hospital room?: Total Help needed climbing 3-5 steps with a railing? : Total 6 Click Score: 8    End of Session   Activity Tolerance: Patient limited by fatigue Patient left: in bed;with call bell/phone within reach;with bed alarm set Nurse Communication: Mobility status PT Visit Diagnosis: Other abnormalities of gait and mobility (R26.89);Muscle weakness (generalized) (M62.81)     Time: 1610-9604 PT Time Calculation (min) (ACUTE ONLY): 13 min  Charges:  $Therapeutic Activity: 8-22 mins                     Lindalou Hose, PT DPT Acute Rehabilitation Services Office [phone]    Leonie Man 12/17/2022, 1:54 PM

## 2022-12-17 NOTE — Progress Notes (Addendum)
Subjective: No complaints or current needs, discussed with her need for HD in recliner tomorrow she agrees.  Was able to talk to her daughter.  Patient has been on dialysis for 3 years.  She has been bedbound for 2-1/2 years.  She was living in a facility but the ultimate hope would be if she could live with family here.  She has had a fistula in the distant past that failed but they are willing to do it again  Objective Vital signs in last 24 hours: Vitals:   12/17/22 0418 12/17/22 0751 12/17/22 0800 12/17/22 1139  BP:   (!) 148/79 (!) 132/48  Pulse:  100  100  Resp:   15 18  Temp:    99.6 F (37.6 C)  TempSrc:  Oral  Oral  SpO2:    100%  Weight: 65.5 kg     Height:       Weight change:   Physical Exam: General: Alert elderly female NAD pleasant Heart: RRR no MRG Lungs: CTA anteriorly nonlabored breathing Abdomen: NABS, soft NTND PEG tube left quadrant Extremities: No pedal edema  dialysis Access: [facility] cath dry intact   OP HD = orders that were entered for Adm  Farm/SW=3 hours 15 minutes- BFR 350 TDC- heparin 1500 units then 750 mid run    Problem/Plan: Abdominal pain workup for cholestasis= surgery involved, status post cystostomy tube placement by IR /has PEG ,  ESRD -HD in hospital MWF (moved from [place]- has not officially started HD at [facility]) prior 3 hours 15 minutes as outpatient order will use 3.5 hrs  for now follow-up labs/ /?not sure why she never had an AVF placed hold any scheduling until further palliative talks.  So family optimistic even though she was bedbound and in SNF in [place]-  will need to talk to them about access-  if we are going to do this we need to do it right  -  when sending out would do 3:45.  Will consult [facility] tomorrow to look for options as far as permanent access placement HTN/volume -mild volume on exam, no BP meds given here- according to list  on 4 meds including HCTZ- to simplify upon discharge- first order of business is to get to EDW then she may  not need BP meds, hopefully can have standing weights at dialysis, attempt 2 to 2.5 L UF -  BP dropped so likely have hit EDW Anemia -Hgb 8.6 <9.6<11.1  /Sat. Ratio 90/Ferritin 3281 / Aranesp 60,mcg  given Mon 4/22  Secondary hyperparathyroidism -low phosphorus corrected with supplement to 2.5  calcium corrected stable, PTH 321 .  No vitamin D currently Nutrition-ALB 3.1, on Osmolite supplement /renal diet  Deconditioning/history of CVA= for nursing home placement/needing recliner HD tomorrow//noted palliative care seeing with family meeting with 4 children  appreciate help discussing goals of care full code currently   Lenny Pastel, PA-C [facility] Beeper [phone] 12/17/2022,12:04 PM  LOS: 5 days   Patient seen and examined, agree with above note with above modifications.  Patient is pleasant and without complaint.  As above I was able to talk with her daughter.  Even though patient has been bedbound for 2-1/2 years and was chronically living in a facility in [place] they are hoping that they may be eventually can take her home.  They are willing to place permanent access so will call [facility] tomorrow.  Otherwise continue with appropriate titration of dialysis related medications Annie Sable, MD 12/17/2022  Labs: Basic Metabolic Panel: Recent Labs  Lab 12/14/22 0501 12/15/22 0124 12/15/22 1204 12/17/22 0428  NA 135  --  139 137  K 3.4*  --  3.6 3.8  CL 103  --  102 97*  CO2 22  --  25 28  GLUCOSE 134*  --  147* 141*  BUN 50*  --  20 16  CREATININE 3.05*  --  2.05* 2.34*  CALCIUM 8.4* 8.7 8.6* 8.5*  PHOS  --  <1.0* 3.1 2.5   Liver Function Tests: Recent Labs  Lab 12/12/22 1917 12/13/22 0146 12/14/22 0501 12/15/22 1204 12/17/22 0428  AST --   --   ALT --   --   ALKPHOS 63 54 47  --   --   BILITOT 0.5 0.4 0.3  --   --   PROT 8.1 7.1 6.2*  --   --   ALBUMIN 4.2 3.7 3.2* 3.6 3.1*   No results for input(s): "LIPASE",  "AMYLASE" in the last 168 hours. No results for input(s): "AMMONIA" in the last 168 hours. CBC: Recent Labs  Lab 12/12/22 1917 12/13/22 0146 12/14/22 0501 12/15/22 1204 12/17/22 0428  WBC 10.1 8.8 6.7 9.2 9.6  NEUTROABS 6.1 5.2  --   --   --   HGB 11.1* 10.1* 9.6* 10.3* 8.6*  HCT 35.1* 30.2* 30.1* 30.6* 26.1*  MCV 92.9 90.4 94.1 91.3 93.5  PLT 176 147* 148* 163 133*   Cardiac Enzymes: No results for input(s): "CKTOTAL", "CKMB", "CKMBINDEX", "TROPONINI" in the last 168 hours. CBG: Recent Labs  Lab 12/16/22 1939 12/16/22 2352 12/17/22 0416 12/17/22 0758 12/17/22 1138  GLUCAP 139* 156* 143* 132* 120*    cefTRIAXone (ROCEPHIN)  IV 2 g (12/17/22 0929)   heparin 800 Units/hr (12/16/22 2022)   metronidazole 500 mg (12/17/22 1009)    Chlorhexidine Gluconate Cloth  6 each Topical Q0600   darbepoetin (ARANESP) injection - DIALYSIS  60 mcg Subcutaneous Q Wed-1800   feeding supplement (OSMOLITE 1.5 CAL)  1,000 mL Per Tube Q24H   insulin aspart  0-6 Units Subcutaneous TID WC   insulin glargine-yfgn  10 Units Subcutaneous QHS   multivitamin  1 tablet Oral QHS   sodium chloride flush  5 mL Intracatheter Q8H

## 2022-12-17 NOTE — Progress Notes (Signed)
Pt has been set-up to receive out-pt HD at [facility] SW on MWF 1:00 chair time. Pt will need to arrive at 12:30 for first appt. Pt did not start at clinic prior to hospital admission. Pt's 1st treatment will be post hospital d/c. Clinic info provided to CSW for snf placement. Contacted nephrologist and renal PA to inquire if there are concerns for pt being able to sit for HD and if a trial of HD in chair is needed prior to d/c. Nephrologist confirms pt will need to trial HD in chair prior to d/c. Will assist as needed.   Olivia Canter Renal Navigator [phone]

## 2022-12-17 NOTE — TOC Progression Note (Addendum)
Transition of Care [facility]) - Progression Note    Patient Details  Name: Samantha [name] MRN: [id number] Date of Birth: 1946/05/26  [facility] CM/SW Contact  Mearl Latin, LCSW Phone Number: 12/17/2022, 3:45 PM  Clinical Narrative:    CSW spoke with patient's daughter, Kennith Maes. CSW shared SNF bed offers with her. She stated that family has been working with [facility] in [place] when patient was at [facility] in [place] to try to get her transferred there. CSW went over potential private pay costs if insurance denies rehab. Daughter stated that the [facility] worker is requesting an FL2. CSW requested her contact info to follow up on how to send it to DSS.   CSW contacted [facility] and they can accept patient if she can be switched to a local dialysis center. Renal Navigator assisting in finding out if [facility] is in network with patient's insurance.   FL2 faxed to DSS Medicaid worker, Judson Roch (Case# [id number], fax # [phone]).   Expected Discharge Plan: Skilled Nursing Facility Barriers to Discharge: Continued Medical Work up, SNF Pending bed offer, English as a second language teacher (Does not have [place] Medicaid yet and likely needs long term)  Expected Discharge Plan and Services In-house Referral: Clinical Social Work   Post Acute Care Choice: Skilled Nursing Facility Living arrangements for the past 2 months: Skilled Nursing Facility                                       Social Determinants of Health (SDOH) Interventions SDOH Screenings   Food Insecurity: No Food Insecurity (12/12/2022)  Housing: Low Risk  (12/12/2022)  Transportation Needs: No Transportation Needs (12/12/2022)  Utilities: Not At Risk (12/12/2022)  Tobacco Use: Unknown (12/16/2022)    Readmission Risk Interventions     No data to display

## 2022-12-17 NOTE — Progress Notes (Signed)
PROGRESS NOTE        PATIENT DETAILS Name: Samantha [name] Age: 77 y.o. Sex: female Date of Birth: 03/26/1946 Admit Date: 12/12/2022 Admitting Physician Angie Fava, DO BMW:[id number], No Pcp Per  Brief Summary: Patient is a 77 y.o.  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  Significant events: 4/20>> admit to [facility] 4/23>> HIDA scan positive 4/24>> surgery reevaluation-cholecystostomy drain recommended  Significant studies: 4/20>> CXR: No acute cardiopulmonary disease. 4/20>> CT abdomen/pelvis: Dilated gallbladder-with stones-including a stone which may be impacted in the neck of the gallbladder.  IVC filter in place. 4/20>> RUQ ultrasound: Cholelithiasis with positive Murphy sign. 4/23>> HIDA scan: Nonvisualization of gallbladder consistent with cystic duct obstruction.  Significant microbiology data: None  Procedures: 4/24>> cholecystostomy drain placement by IR  Consults: General Surgery IR  Subjective: Lying comfortably bed.  RUQ pain is better.  Objective: Vitals: Blood pressure (!) 148/79, pulse 100, temperature 98.7 F (37.1 C), temperature source Oral, resp. rate 15, height 4\' 11"  (1.499 m), weight 65.5 kg, SpO2 94 %.   Exam: Gen Exam:Alert awake-not in any distress HEENT:atraumatic, normocephalic Chest: B/L clear to auscultation anteriorly CVS:S1S2 regular Abdomen:soft non tender, non distended.  PEG tube in place. Extremities:no edema Neurology: Chronic left hemiparesis at baseline Skin: no rash  Pertinent Labs/Radiology:    Latest Ref Rng & Units 12/17/2022    4:28 AM 12/15/2022   12:04 PM 12/14/2022    5:01 AM  CBC  WBC 4.0 - 10.5 K/uL 9.6  9.2  6.7   Hemoglobin 12.0 - 15.0 g/dL 8.6  10.2  9.6   Hematocrit 36.0 - 46.0 % 26.1  30.6   30.1   Platelets 150 - 400 K/uL 133  163  148     Lab Results  Component Value Date   NA 137 12/17/2022   K 3.8 12/17/2022   CL 97 (L) 12/17/2022   CO2 28 12/17/2022      Assessment/Plan: Acute calculus cholecystitis Initial imaging studies were equivocal-however HIDA scan in 4/23 wass +ve Reevaluated general surgery on 4/24-and underwent cholecystostomy tube placement Continue antibiotics x 1 week Advance diet Resume tube feeds as previous IR/CCS following  ESRD on HD MWF Nephrology following. Arrangements being made for outpatient HD by nephrology team  Normocytic anemia Secondary to ESRD Iron/Aranesp defer to nephrology service  DVT Per CT-IVC filter in place If no further procedures including permanent HD access is planned-we can resume Eliquis-for now continue with IV heparin.   For prior chart review-DVT had previous TDC site-this past February.  Plan anticoagulation x 3 months.  History of CVA with left residual hemiparesis Dysphagia with PEG tube in place Failure to thrive syndrome Unclear what her baseline is-per patient-she apparently was able to get around with the help of a walker-no family at bedside to corroborate-tried calling [employer] forces training and education officer.  PT/OT/palliative care/nutrition services all following.  HTN BP relatively stable without the use of any antihypertensive-however creeping up-will need to follow closely and resume some of her antihypertensives when able.  Amlodipine/Coreg/losartan/HCTZ remain on hold  DM-2 (A1c 7.3 on 4/21) CBG stable Semglee 10 units daily-SSI  Recent Labs    12/16/22 2352 12/17/22 0416 12/17/22 0758  GLUCAP 156* 143* 132*  Nutrition Status: Nutrition Problem: Increased nutrient needs Etiology: acute illness Signs/Symptoms: estimated needs Interventions: MVI, Tube feeding  BMI: Estimated body mass index is 29.17 kg/m as calculated from the following:   Height as of this encounter:  (1.499  m).   Weight as of this encounter: 65.5 kg.   Code status:   Code Status: Full Code   DVT Prophylaxis: SCDs Start: 12/12/22 2115   Family Communication: Called daughter-Joclyn-[phone]-left voicemail 4/25.   Disposition Plan: Status is: Inpatient Remains inpatient appropriate because: Severity of illness   Planned Discharge Destination:Home versus SNF-TOC team following.   Diet: Diet Order             Diet full liquid Room service appropriate? Yes; Fluid consistency: Thin  Diet effective now                     Antimicrobial agents: Anti-infectives (From admission, onward)    Start     Dose/Rate Route Frequency Ordered Stop   12/16/22 1500  cefOXitin (MEFOXIN) 2 g in sodium chloride 0.9 % 100 mL IVPB        2 g 200 mL/hr over 30 Minutes Intravenous  Once 12/16/22 1421 12/16/22 1536   12/13/22 0800  cefTRIAXone (ROCEPHIN) 2 g in sodium chloride 0.9 % 100 mL IVPB        2 g 200 mL/hr over 30 Minutes Intravenous Every 24 hours 12/13/22 0753     12/13/22 0800  metroNIDAZOLE (FLAGYL) IVPB 500 mg        500 mg 100 mL/hr over 60 Minutes Intravenous Every 12 hours 12/13/22 0753          MEDICATIONS: Scheduled Meds:  Chlorhexidine Gluconate Cloth  6 each Topical Q0600   darbepoetin (ARANESP) injection - DIALYSIS  60 mcg Subcutaneous Q Wed-1800   feeding supplement (OSMOLITE 1.5 CAL)  1,000 mL Per Tube Q24H   insulin aspart  0-6 Units Subcutaneous TID WC   insulin glargine-yfgn  10 Units Subcutaneous QHS   multivitamin  1 tablet Oral QHS   sodium chloride flush  5 mL Intracatheter Q8H   Continuous Infusions:  cefTRIAXone (ROCEPHIN)  IV 2 g (12/17/22 0929)   heparin 800 Units/hr (12/16/22 2022)   metronidazole 500 mg (12/17/22 1009)   PRN Meds:.acetaminophen **OR** acetaminophen, fentaNYL (SUBLIMAZE) injection, melatonin, naLOXone (NARCAN)  injection, ondansetron (ZOFRAN) IV   I have personally reviewed following labs and imaging studies  LABORATORY  DATA: CBC: Recent Labs  Lab 12/12/22 1917 12/13/22 0146 12/14/22 0501 12/15/22 1204 12/17/22 0428  WBC 10.1 8.8 6.7 9.2 9.6  NEUTROABS 6.1 5.2  --   --   --   HGB 11.1* 10.1* 9.6* 10.3* 8.6*  HCT 35.1* 30.2* 30.1* 30.6* 26.1*  MCV 92.9 90.4 94.1 91.3 93.5  PLT 176 147* 148* 163 133*     Basic Metabolic Panel: Recent Labs  Lab 12/12/22 1917 12/13/22 0146 12/14/22 0501 12/15/22 0124 12/15/22 1204 12/17/22 0428  NA 134* 131* 135  --  139 137  K 3.6 3.3* 3.4*  --  3.6 3.8  CL 95* 95* 103  --  102 97*  CO2 --  25 28  GLUCOSE 166* 136* 134*  --  147* 141*  BUN 39* 43* 50*  --  20 16  CREATININE 2.60* 2.76* 3.05*  --  2.05* 2.34*  CALCIUM 9.7 9.1 8.4* 8.7 8.6* 8.5*  MG 2.4 2.2 2.2 1.8  --   --   PHOS  --  2.1*  --  <1.0* 3.1 2.5     GFR: Estimated Creatinine Clearance: 16.8 mL/min (A) (by C-G formula based on SCr of 2.34 mg/dL (H)).  Liver Function Tests: Recent Labs  Lab 12/12/22 1917 12/13/22 0146 12/14/22 0501 12/15/22 1204 12/17/22 0428  AST --   --   ALT --   --   ALKPHOS 63 54 47  --   --   BILITOT 0.5 0.4 0.3  --   --   PROT 8.1 7.1 6.2*  --   --   ALBUMIN 4.2 3.7 3.2* 3.6 3.1*    No results for input(s): "LIPASE", "AMYLASE" in the last 168 hours. No results for input(s): "AMMONIA" in the last 168 hours.  Coagulation Profile: Recent Labs  Lab 12/13/22 0146  INR 1.5*     Cardiac Enzymes: No results for input(s): "CKTOTAL", "CKMB", "CKMBINDEX", "TROPONINI" in the last 168 hours.  BNP (last 3 results) No results for input(s): "PROBNP" in the last 8760 hours.  Lipid Profile: No results for input(s): "CHOL", "HDL", "LDLCALC", "TRIG", "CHOLHDL", "LDLDIRECT" in the last 72 hours.  Thyroid Function Tests: No results for input(s): "TSH", "T4TOTAL", "FREET4", "T3FREE", "THYROIDAB" in the last 72 hours.  Anemia Panel: Recent Labs    12/15/22 0124  FERRITIN 3,281*  TIBC 197*  IRON 177*     Urine analysis:     Component Value Date/Time   COLORURINE YELLOW 12/13/2022 1104   APPEARANCEUR HAZY (A) 12/13/2022 1104   LABSPEC 1.011 12/13/2022 1104   PHURINE 5.0 12/13/2022 1104   GLUCOSEU 150 (A) 12/13/2022 1104   HGBUR MODERATE (A) 12/13/2022 1104   BILIRUBINUR NEGATIVE 12/13/2022 1104   KETONESUR NEGATIVE 12/13/2022 1104   PROTEINUR 100 (A) 12/13/2022 1104   NITRITE NEGATIVE 12/13/2022 1104   LEUKOCYTESUR NEGATIVE 12/13/2022 1104    Sepsis Labs: Lactic Acid, Venous    Component Value Date/Time   LATICACIDVEN 1.0 12/13/2022 0146    MICROBIOLOGY: Recent Results (from the past 240 hour(s))  Aerobic/Anaerobic Culture w Gram Stain (surgical/deep wound)     Status: None (Preliminary result)   Collection Time: 12/16/22  3:19 PM   Specimen: Gallbladder; Abscess  Result Value Ref Range Status   Specimen Description GALL BLADDER  Final   Special Requests NONE  Final   Gram Stain NO WBC SEEN NO ORGANISMS SEEN   Final   Culture   Final    NO GROWTH < 24 HOURS Performed at [facility], [address] [place], [place] [address]    Report Status PENDING  Incomplete    RADIOLOGY STUDIES/RESULTS: IR Perc Cholecystostomy  Result Date: 12/16/2022 INDICATION: 77 year old female presents for cholecystostomy EXAM: CHOLECYSTOSTOMY MEDICATIONS: 2 g Mefoxin; The antibiotic was administered within an appropriate time frame prior to the initiation of the procedure. ANESTHESIA/SEDATION: Moderate (conscious) sedation was employed during this procedure. A total of Versed 0.0 mg and Fentanyl 70 mcg was administered intravenously. Moderate Sedation Time: 10 minutes. The patient's level of consciousness and vital signs were monitored continuously by radiology nursing throughout the procedure under my direct supervision. FLUOROSCOPY TIME:  Fluoroscopy Time: 0 minutes 6 seconds (4 mGy). COMPLICATIONS: None PROCEDURE: Informed written consent was obtained from the patient and the patient's family after a  thorough discussion of the procedural risks, benefits and alternatives. All questions were addressed. Maximal Sterile Barrier Technique was utilized including caps, mask, sterile gowns, sterile gloves, sterile drape, hand hygiene and skin antiseptic. A timeout was  performed prior to the initiation of the procedure. Ultrasound survey of the right upper quadrant was performed for planning purposes. Once the patient is prepped and draped in the usual sterile fashion, the skin and subcutaneous tissues overlying the gallbladder were generously infiltrated 1% lidocaine for local anesthesia. A coaxial needle was advanced under ultrasound guidance through the skin subcutaneous tissues and a small segment of liver into the gallbladder lumen. With removal of the stylet, spontaneous dark bile drainage occurred. Using modified Seldinger technique, a 10 French drain was placed into the gallbladder fossa, with aspiration of the sample for the lab. Contrast injection confirmed position of the tube within the gallbladder lumen. Drainage catheter was attached to gravity drain with a suture retention placed. Patient tolerated the procedure well and remained hemodynamically stable throughout. No complications were encountered and no significant blood loss encountered. IMPRESSION: Status post image guided percutaneous cholecystostomy Signed, Yvone Neu. Miachel Roux, RPVI [facility] Radiology Electronically Signed   By: Gilmer Mor D.O.   On: 12/16/2022 17:29   NM Hepatobiliary Liver Func  Result Date: 12/15/2022 CLINICAL DATA:  Right upper quadrant abdominal pain. Abnormal ultrasound. EXAM: NUCLEAR MEDICINE HEPATOBILIARY IMAGING TECHNIQUE: Sequential images of the abdomen were obtained out to 60 minutes following intravenous administration of radiopharmaceutical. RADIOPHARMACEUTICALS:  5.4 mCi Tc-25m  Choletec IV COMPARISON:  CT and ultrasound examinations 12/12/2022 FINDINGS:  Prompt symmetric uptake in the liver and prompt excretion into the biliary tree. Activity is seen in the small early during the study. No activity was seen in the gallbladder after 1 hour of imaging. Patient received 2.5 mg morphine sulfate IV and additional 1 hour imaging was performed. The gallbladder was not visualized. Findings consistent with cystic duct obstruction. IMPRESSION: Nonvisualization of the gallbladder consistent with cystic duct obstruction. Electronically Signed   By: Rudie Meyer M.D.   On: 12/15/2022 13:21     LOS: 5 days   Jeoffrey Massed, MD  [facility]    To contact the attending provider between 7A-7P or the covering provider during after hours 7P-7A, please log into the web site [email or web address] and access using universal Kimberly password for that web site. If you do not have the password, please call the hospital operator.  12/17/2022, 10:14 AM

## 2022-12-18 ENCOUNTER — Inpatient Hospital Stay (HOSPITAL_COMMUNITY): Payer: 59

## 2022-12-18 ENCOUNTER — Other Ambulatory Visit (HOSPITAL_COMMUNITY): Payer: Medicare Other

## 2022-12-18 DIAGNOSIS — I10 Essential (primary) hypertension: Secondary | ICD-10-CM | POA: Diagnosis not present

## 2022-12-18 DIAGNOSIS — K810 Acute cholecystitis: Secondary | ICD-10-CM | POA: Diagnosis not present

## 2022-12-18 DIAGNOSIS — R1013 Epigastric pain: Secondary | ICD-10-CM | POA: Diagnosis not present

## 2022-12-18 DIAGNOSIS — E119 Type 2 diabetes mellitus without complications: Secondary | ICD-10-CM | POA: Diagnosis not present

## 2022-12-18 LAB — GLUCOSE, CAPILLARY
Glucose-Capillary: 144 mg/dL — ABNORMAL HIGH (ref 70–99)
Glucose-Capillary: 196 mg/dL — ABNORMAL HIGH (ref 70–99)
Glucose-Capillary: 254 mg/dL — ABNORMAL HIGH (ref 70–99)
Glucose-Capillary: 289 mg/dL — ABNORMAL HIGH (ref 70–99)
Glucose-Capillary: 299 mg/dL — ABNORMAL HIGH (ref 70–99)
Glucose-Capillary: 375 mg/dL — ABNORMAL HIGH (ref 70–99)

## 2022-12-18 LAB — APTT: aPTT: 65 seconds — ABNORMAL HIGH (ref 24–36)

## 2022-12-18 LAB — CBC
HCT: 27.3 % — ABNORMAL LOW (ref 36.0–46.0)
Hemoglobin: 8.7 g/dL — ABNORMAL LOW (ref 12.0–15.0)
MCH: 30.4 pg (ref 26.0–34.0)
MCHC: 31.9 g/dL (ref 30.0–36.0)
MCV: 95.5 fL (ref 80.0–100.0)
Platelets: 129 10*3/uL — ABNORMAL LOW (ref 150–400)
RBC: 2.86 MIL/uL — ABNORMAL LOW (ref 3.87–5.11)
RDW: 19.9 % — ABNORMAL HIGH (ref 11.5–15.5)
WBC: 9 10*3/uL (ref 4.0–10.5)
nRBC: 0.2 % (ref 0.0–0.2)

## 2022-12-18 LAB — RENAL FUNCTION PANEL
Albumin: 3 g/dL — ABNORMAL LOW (ref 3.5–5.0)
Anion gap: 10 (ref 5–15)
BUN: 29 mg/dL — ABNORMAL HIGH (ref 8–23)
CO2: 27 mmol/L (ref 22–32)
Calcium: 8.4 mg/dL — ABNORMAL LOW (ref 8.9–10.3)
Chloride: 99 mmol/L (ref 98–111)
Creatinine, Ser: 3.11 mg/dL — ABNORMAL HIGH (ref 0.44–1.00)
GFR, Estimated: 15 mL/min — ABNORMAL LOW (ref >60)
Glucose, Bld: 260 mg/dL — ABNORMAL HIGH (ref 70–99)
Phosphorus: 2.7 mg/dL (ref 2.5–4.6)
Potassium: 4.3 mmol/L (ref 3.5–5.1)
Sodium: 136 mmol/L (ref 135–145)

## 2022-12-18 LAB — HEPATITIS B CORE ANTIBODY, TOTAL: Hep B Core Total Ab: NONREACTIVE

## 2022-12-18 LAB — HEPARIN LEVEL (UNFRACTIONATED): Heparin Unfractionated: 0.47 IU/mL (ref 0.30–0.70)

## 2022-12-18 LAB — AEROBIC/ANAEROBIC CULTURE W GRAM STAIN (SURGICAL/DEEP WOUND)

## 2022-12-18 MED ORDER — INSULIN GLARGINE-YFGN 100 UNIT/ML ~~LOC~~ SOLN
14.0000 [IU] | Freq: Every day | SUBCUTANEOUS | Status: DC
  Administered 2022-12-18: 14 [IU] via SUBCUTANEOUS
  Filled 2022-12-18 (×2): qty 0.14

## 2022-12-18 MED ORDER — CARVEDILOL 6.25 MG PO TABS
6.2500 mg | ORAL_TABLET | Freq: Two times a day (BID) | ORAL | Status: DC
  Administered 2022-12-19 – 2022-12-23 (×9): 6.25 mg via ORAL
  Filled 2022-12-18 (×8): qty 1

## 2022-12-18 MED ORDER — HEPARIN SODIUM (PORCINE) 1000 UNIT/ML IJ SOLN
INTRAMUSCULAR | Status: AC
  Filled 2022-12-18: qty 5

## 2022-12-18 MED ORDER — INSULIN ASPART 100 UNIT/ML IJ SOLN
0.0000 [IU] | Freq: Three times a day (TID) | INTRAMUSCULAR | Status: DC
  Administered 2022-12-18: 1 [IU] via SUBCUTANEOUS
  Administered 2022-12-18: 5 [IU] via SUBCUTANEOUS
  Administered 2022-12-19: 1 [IU] via SUBCUTANEOUS
  Administered 2022-12-19: 3 [IU] via SUBCUTANEOUS
  Administered 2022-12-20: 1 [IU] via SUBCUTANEOUS
  Administered 2022-12-20: 3 [IU] via SUBCUTANEOUS
  Administered 2022-12-21: 1 [IU] via SUBCUTANEOUS
  Administered 2022-12-21 – 2022-12-22 (×2): 2 [IU] via SUBCUTANEOUS
  Administered 2022-12-22: 1 [IU] via SUBCUTANEOUS
  Administered 2022-12-24: 3 [IU] via SUBCUTANEOUS
  Administered 2022-12-24: 2 [IU] via SUBCUTANEOUS
  Administered 2022-12-24: 5 [IU] via SUBCUTANEOUS

## 2022-12-18 NOTE — Progress Notes (Signed)
Patient to hemo via chair per md request for patient to get hemo up in the chair.

## 2022-12-18 NOTE — Procedures (Signed)
HD Note:  Some information was entered later than the data was gathered due to patient care needs. The stated time with the data is accurate.  Received patient in bed to unit.  Alert and responsive. Oriented to self and cooperative. Did not answer some of the questions asked of her.  Informed consent signed and in chart.   TX duration: 3 hours  Patient had a sudden drop in BP, was placed with her head lower than her feet in the dialysis chair and given 200 ml bolus of NS.  She was diaphoretic and not responsive for the first 3 min.  After bolus, change of position, and stimulation; she became responsive and her BP stabilized.  Another sudden drop occurred and another 200 ml bolus of NS was given.  [name], PA notified. No new orders.  Transported back to the room  Alert, without acute distress.  Hand-off given to patient's nurse.   Access used: Right upper chest HD catheter Access issues: None  Total UF removed: 1400 ml   [facility]

## 2022-12-18 NOTE — Progress Notes (Signed)
ANTICOAGULATION CONSULT NOTE - Follow Up Consult  Pharmacy Consult for Heparin Indication: atrial fibrillation, hx DVT at previous [facility] site in Feb 2024  No Known Allergies  Patient Measurements: Height: 4\' 11"  (149.9 cm) Weight: 71.4 kg (157 lb 6.5 oz) IBW/kg (Calculated) : 43.2 Heparin Dosing Weight: 56 kg  Vital Signs: Temp: 97.9 F (36.6 C) (04/26 0740) Temp Source: Oral (04/26 0740) BP: 145/64 (04/26 1200) Pulse Rate: 96 (04/26 1200)  Labs: Recent Labs    12/16/22 0249 12/17/22 0428 12/18/22 0602  HGB  --  8.6* 8.7*  HCT  --  26.1* 27.3*  PLT  --  133* 129*  APTT 97* 69* 65*  HEPARINUNFRC 0.84* 0.51 0.47  CREATININE  --  2.34*  --      Estimated Creatinine Clearance: 17.6 mL/min (A) (by C-G formula based on SCr of 2.34 mg/dL (H)).   Assessment: 77 yo female admitted with acute cholecystitis on chronic Eliquis for hx DVT in previous [facility] site in Feb 2024. Last dose taken 4/19 in the PM per patient report. Pharmacy asked to start IV heparin while Eliquis on hold for possible need for surgery/procedure.  Heparin drip held at 4/24 am for percutaneous chole tube placement and resumed in pm ~5 hours post-procedure.  aPTT is just below target range (65 seconds) and heparin level therapeutic (0.47) on 800 units/hr. Heparin levels previously falsely elevated due to recent Eliquis doses, now correlating with aPTTs. CBC has trended down. No bleeding reported. Using [facility] for hemodialysis. Permanent access pending.  Goal of Therapy:  Heparin level 0.3-0.7 units/ml aPTT 66-102 seconds Monitor platelets by anticoagulation protocol: Yes   Plan:  Continue Heparin drip at 800 units/hr. Cancel daily aPTTs Daily heparin level and CBC while on heparin. Monitor for signs/symptoms of bleeding. Eliquis on hold until all procedures completed.  Dennie Fetters, RPh 12/18/2022,1:16 PM

## 2022-12-18 NOTE — Progress Notes (Signed)
Upper extremity venous mapping attempted. Per RN, patient going to hemodialysis within the hour, requested to hold off on exam until after. Will attempt again as schedule and patient availability permits.   Jean Rosenthal, RDMS, RVT

## 2022-12-18 NOTE — Progress Notes (Signed)
Patient remains of the floor in hemo.

## 2022-12-18 NOTE — Progress Notes (Addendum)
[facility] Progress Note   Subjective:   Patient seen and examined at bedside.  Vomiting x1 this AM after getting antibiotics.  Per nurse it looked like Osmolite tube feeds.  States she has not eaten anything. No additional vomiting since.  Denies abdominal pain and nausea.  Plan for dialysis today in the chair - patient agreed.  Denies CP and SOB. A little sleepy this AM-  not eating-  got high dose TF overnight-  sugar is up  Objective Vitals:   12/18/22 0300 12/18/22 0400 12/18/22 0449 12/18/22 0740  BP:  (!) 147/54  (!) 156/68  Pulse:    99  Resp: 17 17    Temp:  97.7 F (36.5 C)  97.9 F (36.6 C)  TempSrc:  Oral  Oral  SpO2:  96%    Weight:   71.4 kg   Height:       Physical Exam General:chronically ill appearing female in NAD Heart:RRR, no mrg Lungs:CTAB, nml WOB on RA Abdomen:soft, mildly distended, +PEG, +RUQ drain Extremities:no LE edema  Dialysis Access: [facility] in place   [facility] Weights   12/16/22 1255 12/17/22 0418 12/18/22 0449  Weight: 62.2 kg 65.5 kg 71.4 kg    Intake/Output Summary (Last 24 hours) at 12/18/2022 1047 Last data filed at 12/18/2022 0500 Gross per 24 hour  Intake 1612.95 ml  Output 100 ml  Net 1512.95 ml    Additional Objective Labs: Basic Metabolic Panel: Recent Labs  Lab 12/14/22 0501 12/15/22 0124 12/15/22 1204 12/17/22 0428  NA 135  --  139 137  K 3.4*  --  3.6 3.8  CL 103  --  102 97*  CO2 22  --  25 28  GLUCOSE 134*  --  147* 141*  BUN 50*  --  20 16  CREATININE 3.05*  --  2.05* 2.34*  CALCIUM 8.4* 8.7 8.6* 8.5*  PHOS  --  <1.0* 3.1 2.5   Liver Function Tests: Recent Labs  Lab 12/12/22 1917 12/13/22 0146 12/14/22 0501 12/15/22 1204 12/17/22 0428  AST 27 25 27   --   --   ALT 26 24 23   --   --   ALKPHOS 63 54 47  --   --   BILITOT 0.5 0.4 0.3  --   --   PROT 8.1 7.1 6.2*  --   --   ALBUMIN 4.2 3.7 3.2* 3.6 3.1*   CBC: Recent Labs  Lab 12/12/22 1917 12/13/22 0146 12/14/22 0501 12/15/22 1204  12/17/22 0428 12/18/22 0602  WBC 10.1 8.8 6.7 9.2 9.6 9.0  NEUTROABS 6.1 5.2  --   --   --   --   HGB 11.1* 10.1* 9.6* 10.3* 8.6* 8.7*  HCT 35.1* 30.2* 30.1* 30.6* 26.1* 27.3*  MCV 92.9 90.4 94.1 91.3 93.5 95.5  PLT 176 147* 148* 163 133* 129*   CBG: Recent Labs  Lab 12/17/22 1600 12/17/22 2048 12/18/22 0008 12/18/22 0451 12/18/22 0743  GLUCAP 112* 209* 254* 299* 375*   Studies/Results: IR Perc Cholecystostomy  Result Date: 12/16/2022 INDICATION: 77 year old female presents for cholecystostomy EXAM: CHOLECYSTOSTOMY MEDICATIONS: 2 g Mefoxin; The antibiotic was administered within an appropriate time frame prior to the initiation of the procedure. ANESTHESIA/SEDATION: Moderate (conscious) sedation was employed during this procedure. A total of Versed 0.0 mg and Fentanyl 70 mcg was administered intravenously. Moderate Sedation Time: 10 minutes. The patient's level of consciousness and vital signs were monitored continuously by radiology nursing throughout the procedure under my direct supervision.  FLUOROSCOPY TIME:  Fluoroscopy Time: 0 minutes 6 seconds (4 mGy). COMPLICATIONS: None PROCEDURE: Informed written consent was obtained from the patient and the patient's family after a thorough discussion of the procedural risks, benefits and alternatives. All questions were addressed. Maximal Sterile Barrier Technique was utilized including caps, mask, sterile gowns, sterile gloves, sterile drape, hand hygiene and skin antiseptic. A timeout was performed prior to the initiation of the procedure. Ultrasound survey of the right upper quadrant was performed for planning purposes. Once the patient is prepped and draped in the usual sterile fashion, the skin and subcutaneous tissues overlying the gallbladder were generously infiltrated 1% lidocaine for local anesthesia. A coaxial needle was advanced under ultrasound guidance through the skin subcutaneous tissues and a small segment of liver into the  gallbladder lumen. With removal of the stylet, spontaneous dark bile drainage occurred. Using modified Seldinger technique, a 10 French drain was placed into the gallbladder fossa, with aspiration of the sample for the lab. Contrast injection confirmed position of the tube within the gallbladder lumen. Drainage catheter was attached to gravity drain with a suture retention placed. Patient tolerated the procedure well and remained hemodynamically stable throughout. No complications were encountered and no significant blood loss encountered. IMPRESSION: Status post image guided percutaneous cholecystostomy Signed, Yvone Neu. Miachel Roux, RPVI [facility] Radiology Electronically Signed   By: Gilmer Mor D.O.   On: 12/16/2022 17:29    Medications:  cefTRIAXone (ROCEPHIN)  IV 2 g (12/18/22 1610)   heparin 800 Units/hr (12/16/22 2022)   metronidazole 500 mg (12/18/22 1037)    carvedilol  6.25 mg Oral BID WC   Chlorhexidine Gluconate Cloth  6 each Topical Q0600   darbepoetin (ARANESP) injection - DIALYSIS  60 mcg Subcutaneous Q Wed-1800   feeding supplement (OSMOLITE 1.5 CAL)  1,000 mL Per Tube Q24H   insulin aspart  0-9 Units Subcutaneous TID WC   insulin glargine-yfgn  14 Units Subcutaneous QHS   multivitamin  1 tablet Oral QHS   sodium chloride flush  5 mL Intracatheter Q8H    Dialysis Orders: OP HD = orders that were entered for Adm  Farm/SW=3 hours 15 minutes- BFR 350 TDC- heparin 1500 units then 750 mid run    Problem/Plan: Abdominal pain workup for cholestasis- surgery involved, status post cystostomy tube placement by IR /has PEG ,  ESRD -HD in hospital MWF (moved from [place]- has not officially started HD at AF/SW) prior 3 hours 15 minutes as outpatient order will use 3.5 hrs  for now, likely needs to be 3h66min on d/c.  Agreed to permanent access placement - VVS consulted.  Appreciate their assistance.  Renal navigator working on  outpatient HD placement near SNF. HTN/volume - BP variable, elevated today. On coreg 6.25mg  BID only- apparently on 4 meds PTA including HCTZ at home - does not appear to require these medications now that close to dry. Does not appear grossly overloaded.  Dry weight likely around 63kg per weights  UF as tolerated.  Anemia - Hgb trending down. tsat 90%/Ferritin 3281 - no IV iron.Continue Aranesp qwk, started 4/22.  Secondary hyperparathyroidism -low phosphorus corrected with supplement to 2.5  calcium corrected stable, PTH 321 .  No vitamin D currently Nutrition-ALB 3.1, on Osmolite supplement /renal diet  Deconditioning/history of CVA- for nursing home placement/needing recliner HD tomorrow//noted palliative care seeing with family meeting with 4 children  appreciate help discussing goals of care full code currently  Liz [employer], PA-C  [facility] 12/18/2022,10:47 AM  LOS: 6 days   Patient seen and examined, agree with above note with above modifications. Pt pleasant but not aware-  got overnight TF at a high rate-  may need to readress as I felt like they were to supplement her eating-  HD today in a chair-  working on dispo-  likely to SNF-  appreciate VVS to help with permanent access in the future  Annie Sable, MD 12/18/2022

## 2022-12-18 NOTE — Progress Notes (Addendum)
PROGRESS NOTE        PATIENT DETAILS Name: Samantha [name] Age: 77 y.o. Sex: female Date of Birth: [birth year] Admit Date: 12/12/2022 Admitting Physician Angie Fava, DO ZOX:[id number], No Pcp Per  Brief Summary: Patient is a 77 y.o.  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  Significant events: 4/20>> admit to [facility] 4/23>> HIDA scan positive 4/24>> surgery reevaluation-cholecystostomy drain recommended  Significant studies: 4/20>> CXR: No acute cardiopulmonary disease. 4/20>> CT abdomen/pelvis: Dilated gallbladder-with stones-including a stone which may be impacted in the neck of the gallbladder.  IVC filter in place. 4/20>> RUQ ultrasound: Cholelithiasis with positive Murphy sign. 4/23>> HIDA scan: Nonvisualization of gallbladder consistent with cystic duct obstruction.  Significant microbiology data: None  Procedures: 4/24>> cholecystostomy drain placement by IR  Consults: General Surgery IR  Subjective: No further acute pain.  Lying comfortably in bed.  No major issues overnight.  Objective: Vitals: Blood pressure (!) 156/68, pulse 99, temperature 97.9 F (36.6 C), temperature source Oral, resp. rate 17, height 4\' 11"  (1.499 m), weight 71.4 kg, SpO2 96 %.   Exam: Gen Exam:Alert awake-not in any distress HEENT:atraumatic, normocephalic Chest: B/L clear to auscultation anteriorly CVS:S1S2 regular Abdomen:soft non tender, non distended.  PEG tube in place. Extremities:no edema Neurology: Chronic left hemiparesis-stable Skin: no rash  Pertinent Labs/Radiology:    Latest Ref Rng & Units 12/18/2022    6:02 AM 12/17/2022    4:28 AM 12/15/2022   12:04 PM  CBC  WBC 4.0 - 10.5 K/uL 9.0  9.6  9.2   Hemoglobin 12.0 - 15.0 g/dL 8.7  8.6  09.8   Hematocrit  36.0 - 46.0 % 27.3  26.1  30.6   Platelets 150 - 400 K/uL 129  133  163     Lab Results  Component Value Date   NA 137 12/17/2022   K 3.8 12/17/2022   CL 97 (L) 12/17/2022   CO2 28 12/17/2022      Assessment/Plan: Acute calculus cholecystitis Initial imaging studies were equivocal-however HIDA scan in 4/23 wass +ve Reevaluated general surgery on 4/24-and underwent cholecystostomy tube placement Continue antibiotics x 1 week Tolerating advancement in diet and resumption of tube feeds. CCS recommending outpatient follow-up with Dr. Festus Barren weeks IR following  ESRD on HD MWF Nephrology following. Arrangements being made for outpatient HD by nephrology team  Normocytic anemia Secondary to ESRD Iron/Aranesp defer to nephrology service  DVT Per CT-IVC filter in place Continue IV heparin-permanent HD access being contemplated by nephrology service-once all procedures are complete-we will transition back to Eliquis.    For prior chart review-DVT had previous TDC site-this past February.  Plan anticoagulation x 3 months.  History of CVA with left residual hemiparesis Dysphagia with PEG tube in place Failure to thrive syndrome Discussed with patient's daughter-Samantha [name] on 4/25-at baseline patient is bedbound to wheelchair-bound.  She gets nocturnal feeds via PEG tube.    PT/OT/palliative care/nutrition services all following.  SNF planned.  HTN BP creeping up resume Coreg Amlodipine/losartan/HCTZ on hold.    DM-2 (A1c 7.3 on 4/21) CBG creeping up Increase Semglee to 14 units, change SSI to sensitive scale.  Recent Labs    12/18/22 0008 12/18/22 0451 12/18/22 0743  GLUCAP  254* 299* 375*     Nutrition Status: Nutrition Problem: Increased nutrient needs Etiology: acute illness Signs/Symptoms: estimated needs Interventions: MVI, Tube feeding  BMI: Estimated body mass index is 31.79 kg/m as calculated from the following:   Height as of this encounter: 4\' 11"  (1.499  m).   Weight as of this encounter: 71.4 kg.   Code status:   Code Status: Full Code   DVT Prophylaxis: SCDs Start: 12/12/22 2115   Family Communication: Called daughter-Joclyn-[phone]-left voicemail 4/25.   Disposition Plan: Status is: Inpatient Remains inpatient appropriate because: Severity of illness   Planned Discharge Destination:Home versus SNF-TOC team following.   Diet: Diet Order             Diet regular Room service appropriate? Yes with Assist; Fluid consistency: Thin; Fluid restriction: 1200 mL Fluid  Diet effective now                     Antimicrobial agents: Anti-infectives (From admission, onward)    Start     Dose/Rate Route Frequency Ordered Stop   12/16/22 1500  cefOXitin (MEFOXIN) 2 g in sodium chloride 0.9 % 100 mL IVPB        2 g 200 mL/hr over 30 Minutes Intravenous  Once 12/16/22 1421 12/16/22 1536   12/13/22 0800  cefTRIAXone (ROCEPHIN) 2 g in sodium chloride 0.9 % 100 mL IVPB        2 g 200 mL/hr over 30 Minutes Intravenous Every 24 hours 12/13/22 0753 12/19/22 1200   12/13/22 0800  metroNIDAZOLE (FLAGYL) IVPB 500 mg        500 mg 100 mL/hr over 60 Minutes Intravenous Every 12 hours 12/13/22 0753 12/19/22 2300        MEDICATIONS: Scheduled Meds:  Chlorhexidine Gluconate Cloth  6 each Topical Q0600   darbepoetin (ARANESP) injection - DIALYSIS  60 mcg Subcutaneous Q Wed-1800   feeding supplement (OSMOLITE 1.5 CAL)  1,000 mL Per Tube Q24H   insulin aspart  0-6 Units Subcutaneous TID WC   insulin glargine-yfgn  10 Units Subcutaneous QHS   multivitamin  1 tablet Oral QHS   sodium chloride flush  5 mL Intracatheter Q8H   Continuous Infusions:  cefTRIAXone (ROCEPHIN)  IV 2 g (12/18/22 0808)   heparin 800 Units/hr (12/16/22 2022)   metronidazole 500 mg (12/17/22 1946)   PRN Meds:.acetaminophen **OR** acetaminophen, fentaNYL (SUBLIMAZE) injection, melatonin, naLOXone (NARCAN)  injection, ondansetron (ZOFRAN) IV   I have  personally reviewed following labs and imaging studies  LABORATORY DATA: CBC: Recent Labs  Lab 12/12/22 1917 12/13/22 0146 12/14/22 0501 12/15/22 1204 12/17/22 0428 12/18/22 0602  WBC 10.1 8.8 6.7 9.2 9.6 9.0  NEUTROABS 6.1 5.2  --   --   --   --   HGB 11.1* 10.1* 9.6* 10.3* 8.6* 8.7*  HCT 35.1* 30.2* 30.1* 30.6* 26.1* 27.3*  MCV 92.9 90.4 94.1 91.3 93.5 95.5  PLT 176 147* 148* 163 133* 129*     Basic Metabolic Panel: Recent Labs  Lab 12/12/22 1917 12/13/22 0146 12/14/22 0501 12/15/22 0124 12/15/22 1204 12/17/22 0428  NA 134* 131* 135  --  139 137  K 3.6 3.3* 3.4*  --  3.6 3.8  CL 95* 95* 103  --  102 97*  CO2 23 24 22   --  25 28  GLUCOSE 166* 136* 134*  --  147* 141*  BUN 39* 43* 50*  --  20 16  CREATININE 2.60* 2.76* 3.05*  --  2.05* 2.34*  CALCIUM 9.7 9.1 8.4* 8.7 8.6* 8.5*  MG 2.4 2.2 2.2 1.8  --   --   PHOS  --  2.1*  --  <1.0* 3.1 2.5     GFR: Estimated Creatinine Clearance: 17.6 mL/min (A) (by C-G formula based on SCr of 2.34 mg/dL (H)).  Liver Function Tests: Recent Labs  Lab 12/12/22 1917 12/13/22 0146 12/14/22 0501 12/15/22 1204 12/17/22 0428  AST 27 25 27   --   --   ALT 26 24 23   --   --   ALKPHOS 63 54 47  --   --   BILITOT 0.5 0.4 0.3  --   --   PROT 8.1 7.1 6.2*  --   --   ALBUMIN 4.2 3.7 3.2* 3.6 3.1*    No results for input(s): "LIPASE", "AMYLASE" in the last 168 hours. No results for input(s): "AMMONIA" in the last 168 hours.  Coagulation Profile: Recent Labs  Lab 12/13/22 0146  INR 1.5*     Cardiac Enzymes: No results for input(s): "CKTOTAL", "CKMB", "CKMBINDEX", "TROPONINI" in the last 168 hours.  BNP (last 3 results) No results for input(s): "PROBNP" in the last 8760 hours.  Lipid Profile: No results for input(s): "CHOL", "HDL", "LDLCALC", "TRIG", "CHOLHDL", "LDLDIRECT" in the last 72 hours.  Thyroid Function Tests: No results for input(s): "TSH", "T4TOTAL", "FREET4", "T3FREE", "THYROIDAB" in the last 72  hours.  Anemia Panel: No results for input(s): "VITAMINB12", "FOLATE", "FERRITIN", "TIBC", "IRON", "RETICCTPCT" in the last 72 hours.   Urine analysis:    Component Value Date/Time   COLORURINE YELLOW 12/13/2022 1104   APPEARANCEUR HAZY (A) 12/13/2022 1104   LABSPEC 1.011 12/13/2022 1104   PHURINE 5.0 12/13/2022 1104   GLUCOSEU 150 (A) 12/13/2022 1104   HGBUR MODERATE (A) 12/13/2022 1104   BILIRUBINUR NEGATIVE 12/13/2022 1104   KETONESUR NEGATIVE 12/13/2022 1104   PROTEINUR 100 (A) 12/13/2022 1104   NITRITE NEGATIVE 12/13/2022 1104   LEUKOCYTESUR NEGATIVE 12/13/2022 1104    Sepsis Labs: Lactic Acid, Venous    Component Value Date/Time   LATICACIDVEN 1.0 12/13/2022 0146    MICROBIOLOGY: Recent Results (from the past 240 hour(s))  Aerobic/Anaerobic Culture w Gram Stain (surgical/deep wound)     Status: None (Preliminary result)   Collection Time: 12/16/22  3:19 PM   Specimen: Gallbladder; Abscess  Result Value Ref Range Status   Specimen Description GALL BLADDER  Final   Special Requests NONE  Final   Gram Stain NO WBC SEEN NO ORGANISMS SEEN   Final   Culture   Final    NO GROWTH < 24 HOURS Performed at [facility], [address] [place], [place] [address]    Report Status PENDING  Incomplete    RADIOLOGY STUDIES/RESULTS: IR Perc Cholecystostomy  Result Date: 12/16/2022 INDICATION: 77 year old female presents for cholecystostomy EXAM: CHOLECYSTOSTOMY MEDICATIONS: 2 g Mefoxin; The antibiotic was administered within an appropriate time frame prior to the initiation of the procedure. ANESTHESIA/SEDATION: Moderate (conscious) sedation was employed during this procedure. A total of Versed 0.0 mg and Fentanyl 70 mcg was administered intravenously. Moderate Sedation Time: 10 minutes. The patient's level of consciousness and vital signs were monitored continuously by radiology nursing throughout the procedure under my direct supervision. FLUOROSCOPY TIME:  Fluoroscopy  Time: 0 minutes 6 seconds (4 mGy). COMPLICATIONS: None PROCEDURE: Informed written consent was obtained from the patient and the patient's family after a thorough discussion of the procedural risks, benefits and alternatives. All questions were addressed. Maximal Sterile  Barrier Technique was utilized including caps, mask, sterile gowns, sterile gloves, sterile drape, hand hygiene and skin antiseptic. A timeout was performed prior to the initiation of the procedure. Ultrasound survey of the right upper quadrant was performed for planning purposes. Once the patient is prepped and draped in the usual sterile fashion, the skin and subcutaneous tissues overlying the gallbladder were generously infiltrated 1% lidocaine for local anesthesia. A coaxial needle was advanced under ultrasound guidance through the skin subcutaneous tissues and a small segment of liver into the gallbladder lumen. With removal of the stylet, spontaneous dark bile drainage occurred. Using modified Seldinger technique, a 10 French drain was placed into the gallbladder fossa, with aspiration of the sample for the lab. Contrast injection confirmed position of the tube within the gallbladder lumen. Drainage catheter was attached to gravity drain with a suture retention placed. Patient tolerated the procedure well and remained hemodynamically stable throughout. No complications were encountered and no significant blood loss encountered. IMPRESSION: Status post image guided percutaneous cholecystostomy Signed, Yvone Neu. Miachel Roux, RPVI [facility] Radiology Electronically Signed   By: Gilmer Mor D.O.   On: 12/16/2022 17:29     LOS: 6 days   Jeoffrey Massed, MD  [facility]    To contact the attending provider between 7A-7P or the covering provider during after hours 7P-7A, please log into the web site [email or web address] and access using universal [place] password for that web site.  If you do not have the password, please call the hospital operator.  12/18/2022, 10:27 AM

## 2022-12-18 NOTE — Progress Notes (Signed)
Pt's case discussed with CSW late yesterday afternoon. Pt for possible snf placement in [place]. Referral submitted to [facility] this morning for review for [facility] HD clinic. [facility] hopeful that they can work something out with pt's insurance in order to accept pt. Pt's insurance is out-of-network with clinic but staff state that if pt's family is willing and able to complete a [place] medicaid app then hopefully pt can be accepted. CSW states that family is in the process of completing medicaid application. Will assist as needed.   Olivia Canter Renal Navigator [phone]

## 2022-12-18 NOTE — Progress Notes (Signed)
OT Cancellation Note  Patient Details: Name: Samantha [name] MRN: [id number] DOB: 05/18/1946  Cancelled treatment: Patient at procedure or test/ unavailable; Pt off the floor for HD. OT will continue to follow and will attempt to see pt later this day as time allows.   Felicity Penix "Orson Eva., OTR/L, MA Acute Rehab [phone]  Lendon Colonel 12/18/2022, 1:57 PM

## 2022-12-18 NOTE — Progress Notes (Signed)
Tube feed rate decreased to 92ml/hr per family request. Family stated rate too high and patient will get to full and get nausea and vomiting.

## 2022-12-18 NOTE — Consult Note (Addendum)
Hospital Consult    Reason for Consult:  Permanent dialysis access Requesting Physician:  Otilio Saber MRN #:  [id number]  History of Present Illness: This is a 77 y.o. female with multiple medical co morbidities including ESRD on HD MWF who recently relocated from [facility] to [facility]. She previously has been residing at a SNF and has a right IJ TDC. Family not at beside and patient is not the best historian. Unsure as to why she has previously not had other access except for right IJ TDC. Nephrology has had discussion with family about recommendation for more permanent access. Vascular surgery has been consulted for evaluation of permanent dialysis access. She is left hand dominant. She has no prior access, central lines, or pacemaker. No upper extremity limitations  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   Dementia (HCC)    Diabetes mellitus without complication (HCC)    ESRD (end stage renal disease) on dialysis (HCC)    Heart failure (HCC)    HTN (hypertension)    PEG (percutaneous endoscopic gastrostomy) status (HCC)    Renal disorder    Thyroid disease     Past Surgical History:  Procedure Laterality Date   ABDOMINAL HYSTERECTOMY     IR PERC CHOLECYSTOSTOMY  12/16/2022    No Known Allergies  Prior to Admission medications   Medication Sig Start Date End Date Taking? Authorizing Provider  amLODipine (NORVASC) 10 MG tablet Take 10 mg by mouth daily.   Yes [provider]  bisacodyl (DULCOLAX) 5 MG EC tablet Take 5 mg by mouth daily as needed for moderate constipation.   Yes [provider]  carvedilol (COREG) 25 MG tablet Take 25 mg by mouth 2 (two) times daily with a meal.   Yes [provider]  cholecalciferol (VITAMIN D) 1000 UNITS tablet Take 1,000 Units by mouth daily.   Yes [provider]  Cod Liver Oil 1000 MG CAPS Take 1 capsule by mouth daily.   Yes  [provider]  insulin glargine (LANTUS) 100 UNIT/ML injection Inject 25 Units into the skin at bedtime.   Yes [provider]  insulin lispro (HUMALOG) 100 UNIT/ML injection Inject 19 Units into the skin 2 (two) times daily. 12 units before lunch and 7 units before supper   Yes [provider]  losartan-hydrochlorothiazide (HYZAAR) 100-25 MG per tablet Take 1 tablet by mouth daily.   Yes [provider]  traMADol (ULTRAM) 50 MG tablet Take 1 tablet (50 mg total) by mouth every 6 (six) hours as needed. 11/08/13  Yes Junious Silk, PA-C    Social History   Socioeconomic History   Marital status: Married    Spouse name: Not on file   Number of children: Not on file   Years of education: Not on file   Highest education level: Not on file  Occupational History   Not on file  Tobacco Use   Smoking status: Never   Smokeless tobacco: Not on file  Substance and Sexual Activity   Alcohol use: Not Currently   Drug use: Never   Sexual activity: Not on file  Other Topics Concern   Not on file  Social History Narrative   Not on file   Social Determinants of Health   Financial Resource Strain: Not on file  Food Insecurity: No Food Insecurity (12/12/2022)   Hunger Vital Sign    Worried About Running Out of Food  in the Last Year: Never true    Ran Out of Food in the Last Year: Never true  Transportation Needs: No Transportation Needs (12/12/2022)   PRAPARE - Administrator, Civil Service (Medical): No    Lack of Transportation (Non-Medical): No  Physical Activity: Not on file  Stress: Not on file  Social Connections: Not on file  Intimate Partner Violence: Not At Risk (12/12/2022)   Humiliation, Afraid, Rape, and Kick questionnaire    Fear of Current or Ex-Partner: No    Emotionally Abused: No    Physically Abused: No    Sexually Abused: No     History reviewed. No pertinent family history.  ROS: Otherwise negative unless mentioned  in HPI  Physical Examination  Vitals:   12/18/22 0400 12/18/22 0740  BP: (!) 147/54 (!) 156/68  Pulse:  99  Resp: 17   Temp: 97.7 F (36.5 C) 97.9 F (36.6 C)  SpO2: 96%    Body mass index is 31.79 kg/m.  General:  WDWN in NAD Gait: Not observed HENT: WNL, normocephalic Pulmonary: normal non-labored breathing, without wheezing Cardiac: regular rate and rhythm Vascular Exam/Pulses: 2+ radial pulses bilaterally Extremities: without ischemic changes, without Gangrene , without cellulitis; without open wounds;  Musculoskeletal: no muscle wasting or atrophy  Neurologic: A&O X 3;  No focal weakness or paresthesias are detected; speech is fluent/normal Psychiatric:  The pt has Normal affect.  CBC    Component Value Date/Time   WBC 9.0 12/18/2022 0602   RBC 2.86 (L) 12/18/2022 0602   HGB 8.7 (L) 12/18/2022 0602   HCT 27.3 (L) 12/18/2022 0602   PLT 129 (L) 12/18/2022 0602   MCV 95.5 12/18/2022 0602   MCH 30.4 12/18/2022 0602   MCHC 31.9 12/18/2022 0602   RDW 19.9 (H) 12/18/2022 0602   LYMPHSABS 2.6 12/13/2022 0146   MONOABS 0.8 12/13/2022 0146   EOSABS 0.2 12/13/2022 0146   BASOSABS 0.1 12/13/2022 0146    BMET    Component Value Date/Time   NA 137 12/17/2022 0428   K 3.8 12/17/2022 0428   CL 97 (L) 12/17/2022 0428   CO2 28 12/17/2022 0428   GLUCOSE 141 (H) 12/17/2022 0428   BUN 16 12/17/2022 0428   CREATININE 2.34 (H) 12/17/2022 0428   CALCIUM 8.5 (L) 12/17/2022 0428   CALCIUM 8.7 12/15/2022 0124   GFRNONAA 21 (L) 12/17/2022 0428    COAGS: Lab Results  Component Value Date   INR 1.5 (H) 12/13/2022     Non-Invasive Vascular Imaging:   Bilateral upper extremity vein mapping pending  Statin:  No. Beta Blocker:  Yes.   Aspirin:  No. ACEI:  No. ARB:  Yes.   CCB use:  Yes Other antiplatelets/anticoagulants:  Heparin gtt    ASSESSMENT/PLAN: This is a 77 y.o. female with multiple medical co morbidities including ESRD on HD MWF who recently relocated from  [facility] to [facility]. She previously has been residing at a SNF and has a right IJ TDC. She is left hand dominant. Bilateral upper extremity vein mapping order placed. I discussed AV Fistula vs AV graft with patient. She is agreeable to proceed. She has history of DVT on chronic anticoagulation with Eliquis now on IV heparin. Plan will be for left AVF vs AVG likely next week pending vein mapping. This can be arranged as an outpatient if patient is otherwise able to d/c. The on call vascular surgeon, Dr. Lenell Antu will see patient later today to provide further surgical management plans  Graceann Congress PA-C [facility] [phone] 12/18/2022  10:14 AM  VASCULAR STAFF ADDENDUM: I have independently interviewed and examined the patient. I agree with the above.  Awaiting vein mapping. Minimally interactive to me while on dialysis machine. Will re-evaluate tomorrow.  Rande Brunt. Lenell Antu, MD [facility] Phone Number: [phone] 12/18/2022 4:14 PM

## 2022-12-18 NOTE — Progress Notes (Signed)
[facility] Progress Note     Subjective: CC:  States her pain is better after the perc chole tube. Mild soreness around drain. Denies nausea or vomiting.   Objective: Vital signs in last 24 hours: Temp:  [97.7 F (36.5 C)-99.6 F (37.6 C)] 97.9 F (36.6 C) (04/26 0740) Pulse Rate:  [95-100] 99 (04/26 0740) Resp:  [11-31] 17 (04/26 0400) BP: (126-156)/(48-68) 156/68 (04/26 0740) SpO2:  [96 %-100 %] 96 % (04/26 0400) Weight:  [71.4 kg] 71.4 kg (04/26 0449) Last BM Date : 12/17/22  Intake/Output from previous day: 04/25 0701 - 04/26 0700 In: 1833 [P.O.:400; UX/LK:4401; IV Piggyback:200] Out: 100 [Drains:100] Intake/Output this shift: No intake/output data recorded.  PE: Gen:  Alert, NAD, pleasant Pulm:  Normal effort ORA Abd: Soft, non-tender, non-distended, RUQ drain to gravity, recently emptied. (176mL/24h documented) Skin: warm and dry, no rashes  Psych: A&Ox3   Lab Results:  Recent Labs    12/17/22 0428 12/18/22 0602  WBC 9.6 9.0  HGB 8.6* 8.7*  HCT 26.1* 27.3*  PLT 133* 129*   BMET Recent Labs    12/15/22 1204 12/17/22 0428  NA 139 137  K 3.6 3.8  CL 102 97*  CO2 25 28  GLUCOSE 147* 141*  BUN 20 16  CREATININE 2.05* 2.34*  CALCIUM 8.6* 8.5*   PT/INR No results for input(s): "LABPROT", "INR" in the last 72 hours. CMP     Component Value Date/Time   NA 137 12/17/2022 0428   K 3.8 12/17/2022 0428   CL 97 (L) 12/17/2022 0428   CO2 28 12/17/2022 0428   GLUCOSE 141 (H) 12/17/2022 0428   BUN 16 12/17/2022 0428   CREATININE 2.34 (H) 12/17/2022 0428   CALCIUM 8.5 (L) 12/17/2022 0428   CALCIUM 8.7 12/15/2022 0124   PROT 6.2 (L) 12/14/2022 0501   ALBUMIN 3.1 (L) 12/17/2022 0428   AST 27 12/14/2022 0501   ALT 23 12/14/2022 0501   ALKPHOS 47 12/14/2022 0501   BILITOT 0.3 12/14/2022 0501   GFRNONAA 21 (L) 12/17/2022 0428   Lipase  No results found for: "LIPASE"     Studies/Results: IR Perc Cholecystostomy  Result Date:  12/16/2022 INDICATION: 77 year old female presents for cholecystostomy EXAM: CHOLECYSTOSTOMY MEDICATIONS: 2 g Mefoxin; The antibiotic was administered within an appropriate time frame prior to the initiation of the procedure. ANESTHESIA/SEDATION: Moderate (conscious) sedation was employed during this procedure. A total of Versed 0.0 mg and Fentanyl 70 mcg was administered intravenously. Moderate Sedation Time: 10 minutes. The patient's level of consciousness and vital signs were monitored continuously by radiology nursing throughout the procedure under my direct supervision. FLUOROSCOPY TIME:  Fluoroscopy Time: 0 minutes 6 seconds (4 mGy). COMPLICATIONS: None PROCEDURE: Informed written consent was obtained from the patient and the patient's family after a thorough discussion of the procedural risks, benefits and alternatives. All questions were addressed. Maximal Sterile Barrier Technique was utilized including caps, mask, sterile gowns, sterile gloves, sterile drape, hand hygiene and skin antiseptic. A timeout was performed prior to the initiation of the procedure. Ultrasound survey of the right upper quadrant was performed for planning purposes. Once the patient is prepped and draped in the usual sterile fashion, the skin and subcutaneous tissues overlying the gallbladder were generously infiltrated 1% lidocaine for local anesthesia. A coaxial needle was advanced under ultrasound guidance through the skin subcutaneous tissues and a small segment of liver into the gallbladder lumen. With removal of the stylet, spontaneous dark bile drainage occurred. Using modified Seldinger  technique, a 10 French drain was placed into the gallbladder fossa, with aspiration of the sample for the lab. Contrast injection confirmed position of the tube within the gallbladder lumen. Drainage catheter was attached to gravity drain with a suture retention placed. Patient tolerated the procedure well and remained hemodynamically stable  throughout. No complications were encountered and no significant blood loss encountered. IMPRESSION: Status post image guided percutaneous cholecystostomy Signed, Yvone Neu. Miachel Roux, RPVI [facility] Radiology Electronically Signed   By: Gilmer Mor D.O.   On: 12/16/2022 17:29    Anti-infectives: Anti-infectives (From admission, onward)    Start     Dose/Rate Route Frequency Ordered Stop   12/16/22 1500  cefOXitin (MEFOXIN) 2 g in sodium chloride 0.9 % 100 mL IVPB        2 g 200 mL/hr over 30 Minutes Intravenous  Once 12/16/22 1421 12/16/22 1536   12/13/22 0800  cefTRIAXone (ROCEPHIN) 2 g in sodium chloride 0.9 % 100 mL IVPB        2 g 200 mL/hr over 30 Minutes Intravenous Every 24 hours 12/13/22 0753 12/19/22 1200   12/13/22 0800  metroNIDAZOLE (FLAGYL) IVPB 500 mg        500 mg 100 mL/hr over 60 Minutes Intravenous Every 12 hours 12/13/22 0753 12/19/22 2300        Assessment/Plan Acute cholecystitis S/p percutaneous cholecystostomy tube 12/16/22 Dr. Ardelle Anton, drain care per IR, outpatient follow up with IR  Cx NGTD  No acute surgical needs. Surgery will sign off. Will arrange outpatient follow up with Dr. Cliffton Asters in 4-5 weeks to discuss appropriateness of interval cholecystectomy.      LOS: 6 days   I reviewed nursing notes, Consultant nephrology, IR notes, hospitalist notes, last 24 h vitals and pain scores, last 48 h intake and output, last 24 h labs and trends, and last 24 h imaging results.    Hosie Spangle, PA-C [facility] Please see Amion for pager number during day hours 7:00am-4:30pm

## 2022-12-18 NOTE — Progress Notes (Signed)
Patient back from hemo and cleaned up daughters at bedside.

## 2022-12-19 ENCOUNTER — Inpatient Hospital Stay (HOSPITAL_COMMUNITY): Payer: 59

## 2022-12-19 DIAGNOSIS — K810 Acute cholecystitis: Secondary | ICD-10-CM | POA: Diagnosis not present

## 2022-12-19 DIAGNOSIS — I10 Essential (primary) hypertension: Secondary | ICD-10-CM | POA: Diagnosis not present

## 2022-12-19 DIAGNOSIS — R0609 Other forms of dyspnea: Secondary | ICD-10-CM

## 2022-12-19 DIAGNOSIS — N186 End stage renal disease: Secondary | ICD-10-CM

## 2022-12-19 DIAGNOSIS — R1013 Epigastric pain: Secondary | ICD-10-CM | POA: Diagnosis not present

## 2022-12-19 DIAGNOSIS — E119 Type 2 diabetes mellitus without complications: Secondary | ICD-10-CM | POA: Diagnosis not present

## 2022-12-19 LAB — GLUCOSE, CAPILLARY
Glucose-Capillary: 122 mg/dL — ABNORMAL HIGH (ref 70–99)
Glucose-Capillary: 142 mg/dL — ABNORMAL HIGH (ref 70–99)
Glucose-Capillary: 219 mg/dL — ABNORMAL HIGH (ref 70–99)
Glucose-Capillary: 251 mg/dL — ABNORMAL HIGH (ref 70–99)
Glucose-Capillary: 300 mg/dL — ABNORMAL HIGH (ref 70–99)
Glucose-Capillary: 91 mg/dL (ref 70–99)

## 2022-12-19 LAB — ECHOCARDIOGRAM COMPLETE
Area-P 1/2: 6.27 cm2
Calc EF: 45.2 %
Height: 59 in
S' Lateral: 4 cm
Single Plane A2C EF: 50.2 %
Single Plane A4C EF: 39.9 %
Weight: 2405.66 oz

## 2022-12-19 LAB — HEPARIN LEVEL (UNFRACTIONATED): Heparin Unfractionated: 0.52 IU/mL (ref 0.30–0.70)

## 2022-12-19 LAB — CBC
HCT: 28 % — ABNORMAL LOW (ref 36.0–46.0)
Hemoglobin: 8.7 g/dL — ABNORMAL LOW (ref 12.0–15.0)
MCH: 30.4 pg (ref 26.0–34.0)
MCHC: 31.1 g/dL (ref 30.0–36.0)
MCV: 97.9 fL (ref 80.0–100.0)
Platelets: 130 10*3/uL — ABNORMAL LOW (ref 150–400)
RBC: 2.86 MIL/uL — ABNORMAL LOW (ref 3.87–5.11)
RDW: 20 % — ABNORMAL HIGH (ref 11.5–15.5)
WBC: 11.2 10*3/uL — ABNORMAL HIGH (ref 4.0–10.5)
nRBC: 0.4 % — ABNORMAL HIGH (ref 0.0–0.2)

## 2022-12-19 LAB — AEROBIC/ANAEROBIC CULTURE W GRAM STAIN (SURGICAL/DEEP WOUND)

## 2022-12-19 MED ORDER — INSULIN GLARGINE-YFGN 100 UNIT/ML ~~LOC~~ SOLN
18.0000 [IU] | Freq: Every day | SUBCUTANEOUS | Status: DC
  Administered 2022-12-19 – 2022-12-20 (×2): 18 [IU] via SUBCUTANEOUS
  Filled 2022-12-19 (×3): qty 0.18

## 2022-12-19 MED ORDER — PERFLUTREN LIPID MICROSPHERE
1.0000 mL | INTRAVENOUS | Status: AC | PRN
  Administered 2022-12-19: 4 mL via INTRAVENOUS

## 2022-12-19 MED ORDER — OSMOLITE 1.5 CAL PO LIQD
480.0000 mL | ORAL | Status: DC
  Administered 2022-12-19 – 2022-12-24 (×6): 480 mL
  Filled 2022-12-19 (×3): qty 711
  Filled 2022-12-19 (×2): qty 1000
  Filled 2022-12-19 (×5): qty 711

## 2022-12-19 NOTE — Progress Notes (Signed)
[facility] Progress Note   Subjective:   Patient seen and examined at bedside.  No family present.  Eating breakfast - states she has had a few bites.  Denies n/v/d, abdominal pain, CP and SOB.  Symptomatic hypotension in HD yesterday limiting UF.    Objective Vitals:   12/18/22 2019 12/18/22 2353 12/19/22 0300 12/19/22 0745  BP: (!) 145/60 (!) 156/70  (!) 146/44  Pulse: (!) 104 (!) 113  100  Resp:    19  Temp: 97.9 F (36.6 C)   97.6 F (36.4 C)  TempSrc:    Axillary  SpO2:      Weight:   68.2 kg   Height:       Physical Exam General:chronically ill appearing female in NAD Heart:RRR, no mRG Lungs:mostly CTAB, nml WOb on RA Abdomen:soft, NTND, +PEG, +perchole drain Extremities:trace LE edema Dialysis Access: [facility]   Filed Weights   12/17/22 0418 12/18/22 0449 12/19/22 0300  Weight: 65.5 kg 71.4 kg 68.2 kg    Intake/Output Summary (Last 24 hours) at 12/19/2022 0933 Last data filed at 12/19/2022 0419 Gross per 24 hour  Intake 949.02 ml  Output 1646 ml  Net -696.98 ml    Additional Objective Labs: Basic Metabolic Panel: Recent Labs  Lab 12/15/22 1204 12/17/22 0428 12/18/22 1337  NA 139 137 136  K 3.6 3.8 4.3  CL 102 97* 99  CO2 25 28 27   GLUCOSE 147* 141* 260*  BUN 20 16 29*  CREATININE 2.05* 2.34* 3.11*  CALCIUM 8.6* 8.5* 8.4*  PHOS 3.1 2.5 2.7   Liver Function Tests: Recent Labs  Lab 12/12/22 1917 12/13/22 0146 12/14/22 0501 12/15/22 1204 12/17/22 0428 12/18/22 1337  AST 27 25 27   --   --   --   ALT 26 24 23   --   --   --   ALKPHOS 63 54 47  --   --   --   BILITOT 0.5 0.4 0.3  --   --   --   PROT 8.1 7.1 6.2*  --   --   --   ALBUMIN 4.2 3.7 3.2* 3.6 3.1* 3.0*   CBC: Recent Labs  Lab 12/12/22 1917 12/13/22 0146 12/14/22 0501 12/15/22 1204 12/17/22 0428 12/18/22 0602 12/19/22 0247  WBC 10.1 8.8 6.7 9.2 9.6 9.0 11.2*  NEUTROABS 6.1 5.2  --   --   --   --   --   HGB 11.1* 10.1* 9.6* 10.3* 8.6* 8.7* 8.7*  HCT 35.1* 30.2*  30.1* 30.6* 26.1* 27.3* 28.0*  MCV 92.9 90.4 94.1 91.3 93.5 95.5 97.9  PLT 176 147* 148* 163 133* 129* 130*   Blood Culture    Component Value Date/Time   SDES GALL BLADDER 12/16/2022 1519   SPECREQUEST NONE 12/16/2022 1519   CULT  12/16/2022 1519    NO GROWTH 2 DAYS Performed at [facility], [address], [place], [place] [address]    REPTSTATUS PENDING 12/16/2022 1519    CBG: Recent Labs  Lab 12/18/22 1804 12/18/22 2029 12/19/22 0007 12/19/22 0335 12/19/22 0746  GLUCAP 144* 196* 251* 300* 219*    Medications:  cefTRIAXone (ROCEPHIN)  IV 2 g (12/19/22 0906)   heparin 800 Units/hr (12/16/22 2022)   metronidazole 500 mg (12/18/22 2043)    carvedilol  6.25 mg Oral BID WC   Chlorhexidine Gluconate Cloth  6 each Topical Q0600   darbepoetin (ARANESP) injection - DIALYSIS  60 mcg Subcutaneous Q Wed-1800   feeding supplement (OSMOLITE  1.5 CAL)  1,000 mL Per Tube Q24H   insulin aspart  0-9 Units Subcutaneous TID WC   insulin glargine-yfgn  14 Units Subcutaneous QHS   multivitamin  1 tablet Oral QHS   sodium chloride flush  5 mL Intracatheter Q8H    Dialysis Orders: OP HD = orders that were entered for Adm  Farm/SW=3 hours 15 minutes- BFR 350 TDC- heparin 1500 units then 750 mid run    Problem/Plan: Abdominal pain workup for cholestasis- surgery involved, status post cystostomy tube placement by IR /has PEG ,  ESRD -HD in hospital MWF (moved from [place]- has not officially started HD at AF/SW) prior 3 hours 15 minutes as outpatient order will use 3.5 hrs  for now, likely needs to be 3h26min on d/c.  Agreed to permanent access placement - VVS consulted, considered marginal surgical candidate, may be better suited with perm cath per notes.  Appreciate their assistance.  Renal navigator working on outpatient HD placement near SNF. HTN/volume - BP variable, a little better today, hypotension on HD. On coreg 6.25mg  BID only- apparently on 4 meds PTA including HCTZ at home  - does not appear to require these medications now that close to dry. Does not appear grossly overloaded.  Dry weight likely around 63kg per weights  UF as tolerated.  Anemia - Hgb trending down, now stablized. tsat 90%/Ferritin 3281 - no IV iron.Continue Aranesp qwk, started 4/22.  Secondary hyperparathyroidism -low phosphorus corrected with supplement to 2.5  calcium corrected stable, PTH 321 .  No vitamin D currently Nutrition-ALB 3.1, on Osmolite supplement /renal diet  Deconditioning/history of CVA- for nursing home placement/needing recliner HD tomorrow//noted palliative care seeing with family meeting with 4 children  appreciate help discussing goals of care full code currently    Virgina Norfolk, PA-C [facility] 12/19/2022,9:33 AM  LOS: 7 days

## 2022-12-19 NOTE — Progress Notes (Signed)
ANTICOAGULATION CONSULT NOTE - Follow Up Consult  Pharmacy Consult for Heparin Indication: atrial fibrillation, hx DVT at previous [facility] site in Feb 2024  No Known Allergies  Patient Measurements: Height: 4\' 11"  (149.9 cm) Weight: 68.2 kg (150 lb 5.7 oz) IBW/kg (Calculated) : 43.2 Heparin Dosing Weight: 56 kg  Vital Signs: Temp: 97.9 F (36.6 C) (04/26 2019) BP: 156/70 (04/26 2353) Pulse Rate: 113 (04/26 2353)  Labs: Recent Labs    12/17/22 0428 12/18/22 0602 12/18/22 1337 12/19/22 0247  HGB 8.6* 8.7*  --  8.7*  HCT 26.1* 27.3*  --  28.0*  PLT 133* 129*  --  130*  APTT 69* 65*  --   --   HEPARINUNFRC 0.51 0.47  --  0.52  CREATININE 2.34*  --  3.11*  --      Estimated Creatinine Clearance: 12.9 mL/min (A) (by C-G formula based on SCr of 3.11 mg/dL (H)).   Assessment: 77 yo female admitted with acute cholecystitis on chronic Eliquis for hx DVT in previous [facility] site in Feb 2024. Last dose taken 4/19 in the PM per patient report. Pharmacy asked to start IV heparin while Eliquis on hold for possible need for surgery/procedure.  4/27 AM update: HL 0.52 No signs of bleeding No issues with heparin gtt  Goal of Therapy:  Heparin level 0.3-0.7 units/ml aPTT 66-102 seconds Monitor platelets by anticoagulation protocol: Yes   Plan:  Continue Heparin drip at 800 units/hr. Daily heparin level and CBC while on heparin. Monitor for signs/symptoms of bleeding. Eliquis on hold until all procedures completed.  Greta Doom BS, PharmD, BCPS Clinical Pharmacist 12/19/2022 7:22 AM  Contact: [phone] after 3 PM  "Be curious, not judgmental..." -Debbora Dus

## 2022-12-19 NOTE — Progress Notes (Signed)
PROGRESS NOTE        PATIENT DETAILS Name: Samantha [name] Age: 77 y.o. Sex: female Date of Birth: [birth year] Admit Date: 12/12/2022 Admitting Physician Angie Fava, DO ZOX:[id number], No Pcp Per  Brief Summary: Patient is a 77 y.o.  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  Significant events: 4/20>> admit to [facility] 4/23>> HIDA scan positive 4/24>> surgery reevaluation-cholecystostomy drain recommended  Significant studies: 4/20>> CXR: No acute cardiopulmonary disease. 4/20>> CT abdomen/pelvis: Dilated gallbladder-with stones-including a stone which may be impacted in the neck of the gallbladder.  IVC filter in place. 4/20>> RUQ ultrasound: Cholelithiasis with positive Murphy sign. 4/23>> HIDA scan: Nonvisualization of gallbladder consistent with cystic duct obstruction.  Significant microbiology data: None  Procedures: 4/24>> cholecystostomy drain placement by IR  Consults: General Surgery IR  Subjective: No major issues overnight-lying comfortably in bed.  RUQ pain has essentially resolved.  Objective: Vitals: Blood pressure (!) 146/44, pulse 100, temperature 97.6 F (36.4 C), temperature source Axillary, resp. rate 19, height 4\' 11"  (1.499 m), weight 68.2 kg, SpO2 98 %.   Exam: Gen Exam:Alert awake-not in any distress HEENT:atraumatic, normocephalic Chest: B/L clear to auscultation anteriorly CVS:S1S2 regular Abdomen:soft non tender, non distended-PEG tube in place. Extremities:no edema Neurology: Chronic left-sided hemiparesis. Skin: no rash  Pertinent Labs/Radiology:    Latest Ref Rng & Units 12/19/2022    2:47 AM 12/18/2022    6:02 AM 12/17/2022    4:28 AM  CBC  WBC 4.0 - 10.5 K/uL 11.2  9.0  9.6   Hemoglobin 12.0 - 15.0 g/dL 8.7  8.7  8.6    Hematocrit 36.0 - 46.0 % 28.0  27.3  26.1   Platelets 150 - 400 K/uL 130  129  133     Lab Results  Component Value Date   NA 136 12/18/2022   K 4.3 12/18/2022   CL 99 12/18/2022   CO2 27 12/18/2022      Assessment/Plan: Acute calculus cholecystitis Initial imaging studies were equivocal-however HIDA scan in 4/23 wass +ve Reevaluated general surgery on 4/24-and underwent cholecystostomy tube placement Completed Rocephin/Flagyl x 1 week on 4/27 Tolerating advancement in diet and resumption of tube feeds. CCS recommending outpatient follow-up with Dr. Festus Barren weeks IR following  ESRD on HD MWF Nephrology following. Arrangements being made for outpatient HD by nephrology team  Normocytic anemia Secondary to ESRD Iron/Aranesp defer to nephrology service  DVT Per CT-IVC filter in place Continue IV heparin-permanent HD access being contemplated by nephrology service-once all procedures are complete-we will transition back to Eliquis.    For prior chart review-DVT had previous TDC site-this past February.  Plan anticoagulation x 3 months.  History of CVA with left residual hemiparesis Dysphagia with PEG tube in place Failure to thrive syndrome Discussed with patient's daughter-Samantha [name] on 4/25-at baseline patient is bedbound to wheelchair-bound.  She gets nocturnal feeds via PEG tube.    PT/OT/palliative care/nutrition services all following.  SNF planned.  HTN BP relatively stable with Coreg-this was resumed on 4/26-reassess over the next several days. Amlodipine/losartan/HCTZ on hold.    DM-2 (A1c 7.3 on 4/21) CBG continues to slowly creep up Increase Semglee to 18 units-continue SSI Follow/optimize  Recent Labs  12/19/22 0007 12/19/22 0335 12/19/22 0746  GLUCAP 251* 300* 219*     Nutrition Status: Nutrition Problem: Increased nutrient needs Etiology: acute illness Signs/Symptoms: estimated needs Interventions: MVI, Tube feeding  BMI: Estimated body  mass index is 30.37 kg/m as calculated from the following:   Height as of this encounter: 4\' 11"  (1.499 m).   Weight as of this encounter: 68.2 kg.   Code status:   Code Status: Full Code   DVT Prophylaxis: SCDs Start: 12/12/22 2115   Family Communication: Called daughter-Samantha [name]-[phone]-left voicemail 4/25.   Disposition Plan: Status is: Inpatient Remains inpatient appropriate because: Severity of illness   Planned Discharge Destination:Home versus SNF-TOC team following.   Diet: Diet Order             Diet regular Room service appropriate? Yes with Assist; Fluid consistency: Thin; Fluid restriction: 1200 mL Fluid  Diet effective now                     Antimicrobial agents: Anti-infectives (From admission, onward)    Start     Dose/Rate Route Frequency Ordered Stop   12/16/22 1500  cefOXitin (MEFOXIN) 2 g in sodium chloride 0.9 % 100 mL IVPB        2 g 200 mL/hr over 30 Minutes Intravenous  Once 12/16/22 1421 12/16/22 1536   12/13/22 0800  cefTRIAXone (ROCEPHIN) 2 g in sodium chloride 0.9 % 100 mL IVPB        2 g 200 mL/hr over 30 Minutes Intravenous Every 24 hours 12/13/22 0753 12/19/22 0942   12/13/22 0800  metroNIDAZOLE (FLAGYL) IVPB 500 mg        500 mg 100 mL/hr over 60 Minutes Intravenous Every 12 hours 12/13/22 0753 12/19/22 2300        MEDICATIONS: Scheduled Meds:  carvedilol  6.25 mg Oral BID WC   Chlorhexidine Gluconate Cloth  6 each Topical Q0600   darbepoetin (ARANESP) injection - DIALYSIS  60 mcg Subcutaneous Q Wed-1800   feeding supplement (OSMOLITE 1.5 CAL)  1,000 mL Per Tube Q24H   insulin aspart  0-9 Units Subcutaneous TID WC   insulin glargine-yfgn  14 Units Subcutaneous QHS   multivitamin  1 tablet Oral QHS   sodium chloride flush  5 mL Intracatheter Q8H   Continuous Infusions:  heparin 800 Units/hr (12/16/22 2022)   metronidazole 500 mg (12/19/22 0951)   PRN Meds:.acetaminophen **OR** acetaminophen, fentaNYL (SUBLIMAZE)  injection, melatonin, naLOXone (NARCAN)  injection, ondansetron (ZOFRAN) IV   I have personally reviewed following labs and imaging studies  LABORATORY DATA: CBC: Recent Labs  Lab 12/12/22 1917 12/13/22 0146 12/14/22 0501 12/15/22 1204 12/17/22 0428 12/18/22 0602 12/19/22 0247  WBC 10.1 8.8 6.7 9.2 9.6 9.0 11.2*  NEUTROABS 6.1 5.2  --   --   --   --   --   HGB 11.1* 10.1* 9.6* 10.3* 8.6* 8.7* 8.7*  HCT 35.1* 30.2* 30.1* 30.6* 26.1* 27.3* 28.0*  MCV 92.9 90.4 94.1 91.3 93.5 95.5 97.9  PLT 176 147* 148* 163 133* 129* 130*     Basic Metabolic Panel: Recent Labs  Lab 12/12/22 1917 12/13/22 0146 12/14/22 0501 12/15/22 0124 12/15/22 1204 12/17/22 0428 12/18/22 1337  NA 134* 131* 135  --  139 137 136  K 3.6 3.3* 3.4*  --  3.6 3.8 4.3  CL 95* 95* 103  --  102 97* 99  CO2 23 24 22   --  25 28 27   GLUCOSE 166* 136* 134*  --  147* 141* 260*  BUN 39* 43* 50*  --  20 16 29*  CREATININE 2.60* 2.76* 3.05*  --  2.05* 2.34* 3.11*  CALCIUM 9.7 9.1 8.4* 8.7 8.6* 8.5* 8.4*  MG 2.4 2.2 2.2 1.8  --   --   --   PHOS  --  2.1*  --  <1.0* 3.1 2.5 2.7     GFR: Estimated Creatinine Clearance: 12.9 mL/min (A) (by C-G formula based on SCr of 3.11 mg/dL (H)).  Liver Function Tests: Recent Labs  Lab 12/12/22 1917 12/13/22 0146 12/14/22 0501 12/15/22 1204 12/17/22 0428 12/18/22 1337  AST 27 25 27   --   --   --   ALT 26 24 23   --   --   --   ALKPHOS 63 54 47  --   --   --   BILITOT 0.5 0.4 0.3  --   --   --   PROT 8.1 7.1 6.2*  --   --   --   ALBUMIN 4.2 3.7 3.2* 3.6 3.1* 3.0*    No results for input(s): "LIPASE", "AMYLASE" in the last 168 hours. No results for input(s): "AMMONIA" in the last 168 hours.  Coagulation Profile: Recent Labs  Lab 12/13/22 0146  INR 1.5*     Cardiac Enzymes: No results for input(s): "CKTOTAL", "CKMB", "CKMBINDEX", "TROPONINI" in the last 168 hours.  BNP (last 3 results) No results for input(s): "PROBNP" in the last 8760 hours.  Lipid  Profile: No results for input(s): "CHOL", "HDL", "LDLCALC", "TRIG", "CHOLHDL", "LDLDIRECT" in the last 72 hours.  Thyroid Function Tests: No results for input(s): "TSH", "T4TOTAL", "FREET4", "T3FREE", "THYROIDAB" in the last 72 hours.  Anemia Panel: No results for input(s): "VITAMINB12", "FOLATE", "FERRITIN", "TIBC", "IRON", "RETICCTPCT" in the last 72 hours.   Urine analysis:    Component Value Date/Time   COLORURINE YELLOW 12/13/2022 1104   APPEARANCEUR HAZY (A) 12/13/2022 1104   LABSPEC 1.011 12/13/2022 1104   PHURINE 5.0 12/13/2022 1104   GLUCOSEU 150 (A) 12/13/2022 1104   HGBUR MODERATE (A) 12/13/2022 1104   BILIRUBINUR NEGATIVE 12/13/2022 1104   KETONESUR NEGATIVE 12/13/2022 1104   PROTEINUR 100 (A) 12/13/2022 1104   NITRITE NEGATIVE 12/13/2022 1104   LEUKOCYTESUR NEGATIVE 12/13/2022 1104    Sepsis Labs: Lactic Acid, Venous    Component Value Date/Time   LATICACIDVEN 1.0 12/13/2022 0146    MICROBIOLOGY: Recent Results (from the past 240 hour(s))  Aerobic/Anaerobic Culture w Gram Stain (surgical/deep wound)     Status: None (Preliminary result)   Collection Time: 12/16/22  3:19 PM   Specimen: Gallbladder; Abscess  Result Value Ref Range Status   Specimen Description GALL BLADDER  Final   Special Requests NONE  Final   Gram Stain NO WBC SEEN NO ORGANISMS SEEN   Final   Culture   Final    NO GROWTH 2 DAYS Performed at [facility], [address], [place], [place] [address]    Report Status PENDING  Incomplete    RADIOLOGY STUDIES/RESULTS: No results found.   LOS: 7 days   Jeoffrey Massed, MD  [facility]    To contact the attending provider between 7A-7P or the covering provider during after hours 7P-7A, please log into the web site [email or web address] and access using universal [place] password for that web site. If you do not have the password, please call the hospital operator.  12/19/2022, 11:17 AM

## 2022-12-19 NOTE — Progress Notes (Signed)
Echocardiogram 2D Echocardiogram has been performed.  Toni Amend 12/19/2022, 8:45 AM

## 2022-12-19 NOTE — Progress Notes (Signed)
VASCULAR LAB    Upper extremity vein mapping has been performed.  See CV proc for preliminary results.   Christa Fasig, RVT 12/19/2022, 10:29 AM

## 2022-12-19 NOTE — Progress Notes (Signed)
Nutrition Brief Note   RD received secure chat from RN and MD about current TF regimen. Family concerned that pt is receiving too much and would like to go back to home regimen of 40 mL/hr. RN shared the family reports N/V and decreased PO intake secondary to tube feeds. Current TF regimen is Osmolite 1.5 at 83 mL/hr. Labs and medications reviewed.   New TF regimen: - Osmolite 1.5 at 40 mL/hr x 12 hours (480 mL per day) -Tube feed at goal provides 720 kcal, 30 gm protein, and 366 mL free water daily. (Meets 42% calorie and 35% protein of estimated needs)   RD will continue to follow during admission.   Kirby Crigler RD, LDN Clinical Dietitian See Loretha Stapler for contact information.

## 2022-12-19 NOTE — Progress Notes (Signed)
Patient ID: Samantha [name], female   DOB: 02-26-1946, 77 y.o.   MRN: [id number]  Seen on morning rounds.  Echocardiogram in progress.  Patient more interactive with me.  She reports she is left-handed.  She has left hemiplegia with some spasticity.  Overall she appears a marginal surgical candidate.  I encouraged a palliative care evaluation and goals of care discussion.  Catheter dependence may be best for this patient.  I will continue to follow along.  Rande Brunt. Lenell Antu, MD [facility] Phone Number: [phone] 12/19/2022 9:03 AM

## 2022-12-20 DIAGNOSIS — I10 Essential (primary) hypertension: Secondary | ICD-10-CM | POA: Diagnosis not present

## 2022-12-20 DIAGNOSIS — R1013 Epigastric pain: Secondary | ICD-10-CM | POA: Diagnosis not present

## 2022-12-20 DIAGNOSIS — K810 Acute cholecystitis: Secondary | ICD-10-CM | POA: Diagnosis not present

## 2022-12-20 DIAGNOSIS — E119 Type 2 diabetes mellitus without complications: Secondary | ICD-10-CM | POA: Diagnosis not present

## 2022-12-20 LAB — CBC
HCT: 28.4 % — ABNORMAL LOW (ref 36.0–46.0)
Hemoglobin: 9.3 g/dL — ABNORMAL LOW (ref 12.0–15.0)
MCH: 31.2 pg (ref 26.0–34.0)
MCHC: 32.7 g/dL (ref 30.0–36.0)
MCV: 95.3 fL (ref 80.0–100.0)
Platelets: 146 10*3/uL — ABNORMAL LOW (ref 150–400)
RBC: 2.98 MIL/uL — ABNORMAL LOW (ref 3.87–5.11)
RDW: 20.5 % — ABNORMAL HIGH (ref 11.5–15.5)
WBC: 8.4 10*3/uL (ref 4.0–10.5)
nRBC: 0.4 % — ABNORMAL HIGH (ref 0.0–0.2)

## 2022-12-20 LAB — HEPARIN LEVEL (UNFRACTIONATED): Heparin Unfractionated: 0.46 IU/mL (ref 0.30–0.70)

## 2022-12-20 LAB — GLUCOSE, CAPILLARY
Glucose-Capillary: 102 mg/dL — ABNORMAL HIGH (ref 70–99)
Glucose-Capillary: 143 mg/dL — ABNORMAL HIGH (ref 70–99)
Glucose-Capillary: 147 mg/dL — ABNORMAL HIGH (ref 70–99)
Glucose-Capillary: 232 mg/dL — ABNORMAL HIGH (ref 70–99)
Glucose-Capillary: 77 mg/dL (ref 70–99)
Glucose-Capillary: 88 mg/dL (ref 70–99)

## 2022-12-20 MED ORDER — CHLORHEXIDINE GLUCONATE CLOTH 2 % EX PADS
6.0000 | MEDICATED_PAD | Freq: Every day | CUTANEOUS | Status: DC
  Administered 2022-12-20 – 2022-12-24 (×5): 6 via TOPICAL

## 2022-12-20 MED ORDER — APIXABAN 5 MG PO TABS
5.0000 mg | ORAL_TABLET | Freq: Two times a day (BID) | ORAL | Status: DC
  Administered 2022-12-20 – 2022-12-25 (×10): 5 mg via ORAL
  Filled 2022-12-20 (×10): qty 1

## 2022-12-20 MED ORDER — LOSARTAN POTASSIUM 50 MG PO TABS
25.0000 mg | ORAL_TABLET | Freq: Every day | ORAL | Status: DC
  Administered 2022-12-20 – 2022-12-24 (×4): 25 mg via ORAL
  Filled 2022-12-20 (×4): qty 1

## 2022-12-20 NOTE — Progress Notes (Signed)
ANTICOAGULATION CONSULT NOTE - Follow Up Consult  Pharmacy Consult for Heparin Indication: atrial fibrillation, hx DVT at previous [facility] site in Feb 2024  No Known Allergies  Patient Measurements: Height: 4\' 11"  (149.9 cm) Weight: 64.4 kg (141 lb 15.6 oz) IBW/kg (Calculated) : 43.2 Heparin Dosing Weight: 56 kg  Vital Signs: Temp: 98.2 F (36.8 C) (04/28 0010) Temp Source: Oral (04/28 0010) BP: 166/76 (04/28 0010) Pulse Rate: 98 (04/28 0010)  Labs: Recent Labs    12/18/22 0602 12/18/22 1337 12/19/22 0247 12/20/22 0228  HGB 8.7*  --  8.7* 9.3*  HCT 27.3*  --  28.0* 28.4*  PLT 129*  --  130* 146*  APTT 65*  --   --   --   HEPARINUNFRC 0.47  --  0.52 0.46  CREATININE  --  3.11*  --   --      Estimated Creatinine Clearance: 12.6 mL/min (A) (by C-G formula based on SCr of 3.11 mg/dL (H)).   Assessment: 77 yo female admitted with acute cholecystitis on chronic Eliquis for hx DVT in previous [facility] site in Feb 2024. Last dose taken 4/19 in the PM per patient report. Pharmacy asked to start IV heparin while Eliquis on hold for possible need for surgery/procedure.  4/28 AM update: HL 0.46 No signs of bleeding No issues with heparin gtt  Goal of Therapy:  Heparin level 0.3-0.7 units/ml aPTT 66-102 seconds Monitor platelets by anticoagulation protocol: Yes   Plan:  Continue Heparin drip at 800 units/hr. Daily heparin level and CBC while on heparin. Monitor for signs/symptoms of bleeding. Eliquis on hold until all procedures completed.  Greta Doom BS, PharmD, BCPS Clinical Pharmacist 12/20/2022 6:55 AM  Contact: [phone] after 3 PM  "Be curious, not judgmental..." -Debbora Dus

## 2022-12-20 NOTE — Progress Notes (Signed)
Patient ID: Samantha [name], female   DOB: 07-22-1946, 77 y.o.   MRN: [id number]  Samantha [name] is a 77 y.o. female with ESRD dialyzing via right internal jugular tunneled dialysis catheter.  I had a long discussion with the patient's daughter via telephone today.  She reviewed her mother's clinical course over the past years.  She had a early postoperative infection from dialysis access surgery in her left arm at [facility].  She has been dialyzing successfully through a catheter since.  Her daughter and I are both concerned about her deconditioning and her ability to undergo another procedure.  I think it is best that we wait.  I will see her again in 6 weeks in the clinic to reevaluate.  At that time if she is feeling better, we can pursue a right arm arteriovenous graft creation.  Please call for any questions.  Rande Brunt. Lenell Antu, MD [facility] Phone Number: [phone] 12/20/2022 9:42 AM

## 2022-12-20 NOTE — Progress Notes (Signed)
PROGRESS NOTE        PATIENT DETAILS Name: Samantha [name] Age: 77 y.o. Sex: female Date of Birth: 03-28-1946 Admit Date: 12/12/2022 Admitting Physician Angie Fava, DO ZOX:[id number], No Pcp Per  Brief Summary: Patient is a 77 y.o.  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  Significant events: 4/20>> admit to [facility] 4/23>> HIDA scan positive 4/24>> surgery reevaluation-cholecystostomy drain recommended  Significant studies: 4/20>> CXR: No acute cardiopulmonary disease. 4/20>> CT abdomen/pelvis: Dilated gallbladder-with stones-including a stone which may be impacted in the neck of the gallbladder.  IVC filter in place. 4/20>> RUQ ultrasound: Cholelithiasis with positive Murphy sign. 4/23>> HIDA scan: Nonvisualization of gallbladder consistent with cystic duct obstruction. 4/27>> echo: EF 40-45%, regional wall motion abnormalities.  Significant microbiology data: None  Procedures: 4/24>> cholecystostomy drain placement by IR  Consults: General Surgery IR Vascular surgery.  Subjective: No major issues overnight.  Lying comfortably in bed.  Acknowledges she did eat some meals yesterday.  Objective: Vitals: Blood pressure (!) 157/59, pulse 100, temperature 98.3 F (36.8 C), temperature source Oral, resp. rate 15, height 4\' 11"  (1.499 m), weight 64.4 kg, SpO2 100 %.   Exam: Gen Exam:Alert awake-not in any distress HEENT:atraumatic, normocephalic Chest: B/L clear to auscultation anteriorly CVS:S1S2 regular Abdomen:soft non tender, non distended-PEG tube in place. Extremities:no edema Neurology: Chronic left-sided hemiparesis. Skin: no rash  Pertinent Labs/Radiology:    Latest Ref Rng & Units 12/20/2022    2:28 AM 12/19/2022    2:47 AM 12/18/2022    6:02  AM  CBC  WBC 4.0 - 10.5 K/uL 8.4  11.2  9.0   Hemoglobin 12.0 - 15.0 g/dL 9.3  8.7  8.7   Hematocrit 36.0 - 46.0 % 28.4  28.0  27.3   Platelets 150 - 400 K/uL 146  130  129     Lab Results  Component Value Date   NA 136 12/18/2022   K 4.3 12/18/2022   CL 99 12/18/2022   CO2 27 12/18/2022     Assessment/Plan: Acute calculus cholecystitis Initial imaging studies were equivocal-however HIDA scan in 4/23 wass +ve Reevaluated general surgery on 4/24-and underwent cholecystostomy tube placement Completed Rocephin/Flagyl x 1 week on 4/27 Tolerating advancement in diet and resumption of tube feeds. CCS recommending outpatient follow-up with Dr. Festus Barren weeks IR following  ESRD on HD MWF Nephrology following. Arrangements being made for outpatient HD by nephrology team Per vascular surgery-no plans to place permanent HD access this admission.  Normocytic anemia Secondary to ESRD Iron/Aranesp defer to nephrology service  DVT Per CT-IVC filter in place On IV heparin-since vascular surgery no longer planning on HD HD access in this admission-switch back to Eliquis 4/28.  For prior chart review-DVT had previous [facility] site-this past February.  Plan anticoagulation x 3 months.  History of CVA with left residual hemiparesis Dysphagia with PEG tube in place Failure to thrive syndrome Discussed with patient's daughter-[name] on 4/25-at baseline patient is bedbound to wheelchair-bound.  She gets nocturnal feeds via PEG tube.    PT/OT/palliative care/nutrition services all following.  SNF planned.  Chronic HFrEF Euvolemic Volume removal HD Coreg/losartan  History of CAD Per daughter-patient needs to follow-up with cardiology in the outpatient setting Echo  with slightly reduced EF and numerous wall motion abnormalities Currently without any anginal symptoms Will need to establish with cardiology in the outpatient setting.  HTN BP fluctuating-continue Coreg-add losartan.    DM-2  (A1c 7.3 on 4/21) CBG continues to slowly creep up Increase Semglee to 18 units-continue SSI Follow/optimize  Recent Labs    12/19/22 2042 12/20/22 0011 12/20/22 0833  GLUCAP 142* 143* 232*     Nutrition Status: Nutrition Problem: Increased nutrient needs Etiology: acute illness Signs/Symptoms: estimated needs Interventions: MVI, Tube feeding  BMI: Estimated body mass index is 28.68 kg/m as calculated from the following:   Height as of this encounter: 4\' 11"  (1.499 m).   Weight as of this encounter: 64.4 kg.   Code status:   Code Status: Full Code   DVT Prophylaxis: SCDs Start: 12/12/22 2115   Family Communication: Called daughter-[phone]-updated 4/28   Disposition Plan: Status is: Inpatient Remains inpatient appropriate because: Severity of illness   Planned Discharge Destination:Home versus SNF-TOC team following.   Diet: Diet Order             Diet regular Room service appropriate? Yes with Assist; Fluid consistency: Thin; Fluid restriction: 1200 mL Fluid  Diet effective now                     Antimicrobial agents: Anti-infectives (From admission, onward)    Start     Dose/Rate Route Frequency Ordered Stop   12/16/22 1500  cefOXitin (MEFOXIN) 2 g in sodium chloride 0.9 % 100 mL IVPB        2 g 200 mL/hr over 30 Minutes Intravenous  Once 12/16/22 1421 12/16/22 1536   12/13/22 0800  cefTRIAXone (ROCEPHIN) 2 g in sodium chloride 0.9 % 100 mL IVPB        2 g 200 mL/hr over 30 Minutes Intravenous Every 24 hours 12/13/22 0753 12/19/22 0942   12/13/22 0800  metroNIDAZOLE (FLAGYL) IVPB 500 mg        500 mg 100 mL/hr over 60 Minutes Intravenous Every 12 hours 12/13/22 0753 12/19/22 2221        MEDICATIONS: Scheduled Meds:  carvedilol  6.25 mg Oral BID WC   Chlorhexidine Gluconate Cloth  6 each Topical Q0600   Chlorhexidine Gluconate Cloth  6 each Topical Q0600   darbepoetin (ARANESP) injection - DIALYSIS  60 mcg Subcutaneous Q  Wed-1800   feeding supplement (OSMOLITE 1.5 CAL)  480 mL Per Tube Q24H   insulin aspart  0-9 Units Subcutaneous TID WC   insulin glargine-yfgn  18 Units Subcutaneous QHS   multivitamin  1 tablet Oral QHS   sodium chloride flush  5 mL Intracatheter Q8H   Continuous Infusions:  heparin 800 Units/hr (12/19/22 1813)   PRN Meds:.acetaminophen **OR** acetaminophen, fentaNYL (SUBLIMAZE) injection, melatonin, naLOXone (NARCAN)  injection, ondansetron (ZOFRAN) IV   I have personally reviewed following labs and imaging studies  LABORATORY DATA: CBC: Recent Labs  Lab 12/15/22 1204 12/17/22 0428 12/18/22 0602 12/19/22 0247 12/20/22 0228  WBC 9.2 9.6 9.0 11.2* 8.4  HGB 10.3* 8.6* 8.7* 8.7* 9.3*  HCT 30.6* 26.1* 27.3* 28.0* 28.4*  MCV 91.3 93.5 95.5 97.9 95.3  PLT 163 133* 129* 130* 146*     Basic Metabolic Panel: Recent Labs  Lab 12/14/22 0501 12/15/22 0124 12/15/22 1204 12/17/22 0428 12/18/22 1337  NA 135  --  139 137 136  K 3.4*  --  3.6 3.8 4.3  CL 103  --  102 97* 99  CO2  22  --  25 28 27   GLUCOSE 134*  --  147* 141* 260*  BUN 50*  --  20 16 29*  CREATININE 3.05*  --  2.05* 2.34* 3.11*  CALCIUM 8.4* 8.7 8.6* 8.5* 8.4*  MG 2.2 1.8  --   --   --   PHOS  --  <1.0* 3.1 2.5 2.7     GFR: Estimated Creatinine Clearance: 12.6 mL/min (A) (by C-G formula based on SCr of 3.11 mg/dL (H)).  Liver Function Tests: Recent Labs  Lab 12/14/22 0501 12/15/22 1204 12/17/22 0428 12/18/22 1337  AST 27  --   --   --   ALT 23  --   --   --   ALKPHOS 47  --   --   --   BILITOT 0.3  --   --   --   PROT 6.2*  --   --   --   ALBUMIN 3.2* 3.6 3.1* 3.0*    No results for input(s): "LIPASE", "AMYLASE" in the last 168 hours. No results for input(s): "AMMONIA" in the last 168 hours.  Coagulation Profile: No results for input(s): "INR", "PROTIME" in the last 168 hours.   Cardiac Enzymes: No results for input(s): "CKTOTAL", "CKMB", "CKMBINDEX", "TROPONINI" in the last 168  hours.  BNP (last 3 results) No results for input(s): "PROBNP" in the last 8760 hours.  Lipid Profile: No results for input(s): "CHOL", "HDL", "LDLCALC", "TRIG", "CHOLHDL", "LDLDIRECT" in the last 72 hours.  Thyroid Function Tests: No results for input(s): "TSH", "T4TOTAL", "FREET4", "T3FREE", "THYROIDAB" in the last 72 hours.  Anemia Panel: No results for input(s): "VITAMINB12", "FOLATE", "FERRITIN", "TIBC", "IRON", "RETICCTPCT" in the last 72 hours.   Urine analysis:    Component Value Date/Time   COLORURINE YELLOW 12/13/2022 1104   APPEARANCEUR HAZY (A) 12/13/2022 1104   LABSPEC 1.011 12/13/2022 1104   PHURINE 5.0 12/13/2022 1104   GLUCOSEU 150 (A) 12/13/2022 1104   HGBUR MODERATE (A) 12/13/2022 1104   BILIRUBINUR NEGATIVE 12/13/2022 1104   KETONESUR NEGATIVE 12/13/2022 1104   PROTEINUR 100 (A) 12/13/2022 1104   NITRITE NEGATIVE 12/13/2022 1104   LEUKOCYTESUR NEGATIVE 12/13/2022 1104    Sepsis Labs: Lactic Acid, Venous    Component Value Date/Time   LATICACIDVEN 1.0 12/13/2022 0146    MICROBIOLOGY: Recent Results (from the past 240 hour(s))  Aerobic/Anaerobic Culture w Gram Stain (surgical/deep wound)     Status: None (Preliminary result)   Collection Time: 12/16/22  3:19 PM   Specimen: Gallbladder; Abscess  Result Value Ref Range Status   Specimen Description GALL BLADDER  Final   Special Requests NONE  Final   Gram Stain NO WBC SEEN NO ORGANISMS SEEN   Final   Culture   Final    NO GROWTH 3 DAYS NO ANAEROBES ISOLATED; CULTURE IN PROGRESS FOR 5 DAYS Performed at [facility], [address] [place], [place] [address]    Report Status PENDING  Incomplete    RADIOLOGY STUDIES/RESULTS: VAS [place] UPPER EXT VEIN MAPPING (PRE-OP AVF)  Result Date: 12/19/2022 UPPER EXTREMITY VEIN MAPPING Patient Name:  THRESSA SHIFFER  Date of Exam:   12/19/2022 Medical Rec #: [id number]        Accession #:    [id number] Date of Birth: 1946-01-28        Patient Gender: F  Patient Age:   64 years Exam Location:  [facility] Procedure:      VAS [place] UPPER EXT VEIN MAPPING (PRE-OP AVF) Referring  Phys: Graceann Congress --------------------------------------------------------------------------------  Indications: Pre-access. Limitations: Multiple IVs and bandages in the left arm. Patient would not extend              left arm Comparison Study: No prior study on file Performing Technologist: Sherren Kerns RVS  Examination Guidelines: A complete evaluation includes B-mode imaging, spectral Doppler, color Doppler, and power Doppler as needed of all accessible portions of each vessel. Bilateral testing is considered an integral part of a complete examination. Limited examinations for reoccurring indications may be performed as noted. +-----------------+-------------+----------+---------+ Right Cephalic   Diameter (cm)Depth (cm)Findings  +-----------------+-------------+----------+---------+ Prox upper arm       0.24        0.83             +-----------------+-------------+----------+---------+ Mid upper arm        0.15        0.92             +-----------------+-------------+----------+---------+ Dist upper arm       0.23        0.44             +-----------------+-------------+----------+---------+ Antecubital fossa    0.26        0.26             +-----------------+-------------+----------+---------+ Prox forearm         0.16        0.50   branching +-----------------+-------------+----------+---------+ Mid forearm          0.16        0.21             +-----------------+-------------+----------+---------+ Dist forearm         0.16        0.16             +-----------------+-------------+----------+---------+ +-----------------+-------------+----------+--------+ Right Basilic    Diameter (cm)Depth (cm)Findings +-----------------+-------------+----------+--------+ Mid upper arm        0.37        0.24    origin   +-----------------+-------------+----------+--------+ Dist upper arm       0.39        2.02            +-----------------+-------------+----------+--------+ Antecubital fossa    0.35        1.83            +-----------------+-------------+----------+--------+ Prox forearm         0.33        0.97            +-----------------+-------------+----------+--------+ Mid forearm          0.19        0.74            +-----------------+-------------+----------+--------+ Distal forearm       0.16        0.68            +-----------------+-------------+----------+--------+ +-----------------+-------------+----------+------------------------------+ Left Cephalic    Diameter (cm)Depth (cm)           Findings            +-----------------+-------------+----------+------------------------------+ Prox upper arm       0.31        1.06                                  +-----------------+-------------+----------+------------------------------+ Mid upper arm        0.32        0.83                                  +-----------------+-------------+----------+------------------------------+  Dist upper arm                          not visualized and IV/bandages +-----------------+-------------+----------+------------------------------+ Antecubital fossa                       not visualized and IV/bandages +-----------------+-------------+----------+------------------------------+ Prox forearm                            not visualized and IV/bandages +-----------------+-------------+----------+------------------------------+ Mid forearm                             not visualized and IV/bandages +-----------------+-------------+----------+------------------------------+ Dist forearm                            not visualized and IV/bandages +-----------------+-------------+----------+------------------------------+ Wrist                                             IV/bandages           +-----------------+-------------+----------+------------------------------+ +-----------------+-------------+----------+------------------------------+ Left Basilic     Diameter (cm)Depth (cm)           Findings            +-----------------+-------------+----------+------------------------------+ Prox upper arm                          not visualized and IV/bandages +-----------------+-------------+----------+------------------------------+ Mid upper arm                           not visualized and IV/bandages +-----------------+-------------+----------+------------------------------+ Dist upper arm                          not visualized and IV/bandages +-----------------+-------------+----------+------------------------------+ Antecubital fossa                       not visualized and IV/bandages +-----------------+-------------+----------+------------------------------+ Prox forearm                            not visualized and IV/bandages +-----------------+-------------+----------+------------------------------+ Mid forearm                             not visualized and IV/bandages +-----------------+-------------+----------+------------------------------+ Distal forearm                          not visualized and IV/bandages +-----------------+-------------+----------+------------------------------+ Elbow                                            IV/bandages           +-----------------+-------------+----------+------------------------------+ Wrist                                   not visualized and IV/bandages +-----------------+-------------+----------+------------------------------+ Unable to visualize the basilic and the majority of the cephalic veins secondary to multiple IV's and the patient's  refusal to extend arm *See table(s) above for measurements and observations.  Diagnosing physician: Heath Lark Electronically signed by  Heath Lark on 12/19/2022 at 1:34:40 PM.    Final    ECHOCARDIOGRAM COMPLETE  Result Date: 12/19/2022    ECHOCARDIOGRAM REPORT   Patient Name:   EMMALEAH MERONEY Date of Exam: 12/19/2022 Medical Rec #:  [id number]       Height:       59.0 in Accession #:    [id number]      Weight:       150.4 lb Date of Birth:  07-Feb-1946       BSA:          1.634 m Patient Age:    77 years        BP:           150/57 mmHg Patient Gender: F               HR:           102 bpm. Exam Location:  Inpatient Procedure: 2D Echo, Cardiac Doppler, Color Doppler and Intracardiac            Opacification Agent Indications:    dyspnea  History:        Patient has no prior history of Echocardiogram examinations.                 Stroke; Risk Factors:Diabetes and Hypertension.  Sonographer:    Mike Gip Referring Phys: [id number] Werner Lean Aaliyah Gavel IMPRESSIONS  1. Left ventricular ejection fraction, by estimation, is 40 to 45%. The left ventricle has mildly decreased function. The left ventricle demonstrates regional wall motion abnormalities (see scoring diagram/findings for description). Indeterminate diastolic filling due to E-A fusion. There is moderate hypokinesis of the left ventricular, basal-mid lateral wall and inferolateral wall.  2. Right ventricular systolic function is normal. The right ventricular size is normal. Tricuspid regurgitation signal is inadequate for assessing PA pressure.  3. The mitral valve is normal in structure. Mild mitral valve regurgitation.  4. The aortic valve is tricuspid. Aortic valve regurgitation is not visualized. No aortic stenosis is present. FINDINGS  Left Ventricle: Left ventricular ejection fraction, by estimation, is 40 to 45%. The left ventricle has mildly decreased function. The left ventricle demonstrates regional wall motion abnormalities. Moderate hypokinesis of the left ventricular, basal-mid lateral wall and inferolateral wall. Definity contrast agent was given IV to delineate the left  ventricular endocardial borders. The left ventricular internal cavity size was normal in size. There is no left ventricular hypertrophy. Indeterminate diastolic filling due to E-A fusion.  LV Wall Scoring: The antero-lateral wall and posterior wall are hypokinetic. Right Ventricle: The right ventricular size is normal. No increase in right ventricular wall thickness. Right ventricular systolic function is normal. Tricuspid regurgitation signal is inadequate for assessing PA pressure. Left Atrium: Left atrial size was normal in size. Right Atrium: Right atrial size was normal in size. Pericardium: There is no evidence of pericardial effusion. Mitral Valve: The mitral valve is normal in structure. Mild mitral valve regurgitation, with centrally-directed jet. Tricuspid Valve: The tricuspid valve is normal in structure. Tricuspid valve regurgitation is not demonstrated. Aortic Valve: The aortic valve is tricuspid. Aortic valve regurgitation is not visualized. No aortic stenosis is present. Pulmonic Valve: The pulmonic valve was normal in structure. Pulmonic valve regurgitation is not visualized. Aorta: The aortic root and ascending aorta are structurally normal, with no evidence of dilitation. IAS/Shunts: The interatrial septum was not well visualized.  LEFT VENTRICLE PLAX 2D LVIDd:         4.80 cm      Diastology LVIDs:         4.00 cm      LV e' medial:    8.05 cm/s LV PW:         0.70 cm      LV E/e' medial:  14.9 LV IVS:        1.00 cm      LV e' lateral:   6.74 cm/s LVOT diam:     2.00 cm      LV E/e' lateral: 17.8 LV SV:         43 LV SV Index:   26 LVOT Area:     3.14 cm  LV Volumes (MOD) LV vol d, MOD A2C: 133.0 ml LV vol d, MOD A4C: 124.0 ml LV vol s, MOD A2C: 66.3 ml LV vol s, MOD A4C: 74.5 ml LV SV MOD A2C:     66.7 ml LV SV MOD A4C:     124.0 ml LV SV MOD BP:      58.0 ml RIGHT VENTRICLE RV Basal diam:  2.90 cm RV S prime:     6.53 cm/s LEFT ATRIUM             Index LA diam:        3.00 cm 1.84 cm/m LA Vol  (A2C):   35.0 ml 21.42 ml/m LA Vol (A4C):   34.0 ml 20.81 ml/m LA Biplane Vol: 34.6 ml 21.18 ml/m  AORTIC VALVE LVOT Vmax:   77.40 cm/s LVOT Vmean:  49.300 cm/s LVOT VTI:    0.136 m  AORTA Ao Root diam: 2.70 cm Ao Asc diam:  2.80 cm MITRAL VALVE MV Area (PHT): 6.27 cm     SHUNTS MV Decel Time: 121 msec     Systemic VTI:  0.14 m MV E velocity: 120.00 cm/s  Systemic Diam: 2.00 cm Mihai Croitoru MD Electronically signed by Thurmon Fair MD Signature Date/Time: 12/19/2022/11:38:14 AM    Final      LOS: 8 days   Jeoffrey Massed, MD  [facility]    To contact the attending provider between 7A-7P or the covering provider during after hours 7P-7A, please log into the web site [email or web address] and access using universal [place] password for that web site. If you do not have the password, please call the hospital operator.  12/20/2022, 10:03 AM

## 2022-12-20 NOTE — Plan of Care (Signed)

## 2022-12-20 NOTE — Progress Notes (Signed)
ANTICOAGULATION CONSULT NOTE - Follow Up Consult  Pharmacy Consult for Heparin transition to home apixaban dosing Indication: atrial fibrillation, hx DVT at previous [facility] site in Feb 2024  No Known Allergies  Patient Measurements: Height: 4\' 11"  (149.9 cm) Weight: 64.4 kg (141 lb 15.6 oz) IBW/kg (Calculated) : 43.2 Heparin Dosing Weight: 56 kg  Vital Signs: Temp: 98.3 F (36.8 C) (04/28 0827) Temp Source: Oral (04/28 0827) BP: 157/59 (04/28 0827) Pulse Rate: 100 (04/28 0827)  Labs: Recent Labs    12/18/22 0602 12/18/22 1337 12/19/22 0247 12/20/22 0228  HGB 8.7*  --  8.7* 9.3*  HCT 27.3*  --  28.0* 28.4*  PLT 129*  --  130* 146*  APTT 65*  --   --   --   HEPARINUNFRC 0.47  --  0.52 0.46  CREATININE  --  3.11*  --   --      Estimated Creatinine Clearance: 12.6 mL/min (A) (by C-G formula based on SCr of 3.11 mg/dL (H)).   Assessment: 77 yo female admitted with acute cholecystitis on chronic Eliquis for hx DVT in previous [facility] site in Feb 2024. Last dose taken 4/19 in the PM per patient report. Pharmacy asked to transition back to eliquis   Goal of Therapy:  Monitor platelets by anticoagulation protocol: Yes   Plan:  Discontinue Heparin drip  Start Eliquis 5 mg po bid Monitor for signs/symptoms of bleeding Pharmacy will sign off of consult but continue to monitor making recommendations prn   Greta Doom BS, PharmD, BCPS Clinical Pharmacist 12/20/2022 10:04 AM  Contact: [phone] after 3 PM  "Be curious, not judgmental..." -Debbora Dus

## 2022-12-20 NOTE — Progress Notes (Addendum)
[facility] Progress Note   Subjective:   Patient seen and examined at bedside in room.  Reports abdominal pain with nausea this AM.  Getting ready to eat breakfast, has tube feeds overnight.  Having a lot of secretions - feels like she has to spit. Nurse setting up suction for her.  Denies CP, SOB, vomiting and diarrhea.  Vein mapping completed yesterday.   Objective Vitals:   12/19/22 1800 12/19/22 2000 12/20/22 0010 12/20/22 0500  BP:  (!) 158/70 (!) 166/76   Pulse:   98   Resp: 16 16 17    Temp:  98.5 F (36.9 C) 98.2 F (36.8 C)   TempSrc:  Oral Oral   SpO2:   100%   Weight:    64.4 kg  Height:       Physical Exam General:chronically ill appearing female in NAD Heart:RRR, no mrg Lungs:CTAB, nml WOB on RA Abdomen:soft, +PEG Extremities:no LE edema Dialysis Access: [facility]   Filed Weights   12/18/22 0449 12/19/22 0300 12/20/22 0500  Weight: 71.4 kg 68.2 kg 64.4 kg    Intake/Output Summary (Last 24 hours) at 12/20/2022 0815 Last data filed at 12/20/2022 0500 Gross per 24 hour  Intake 1210.67 ml  Output 40 ml  Net 1170.67 ml    Additional Objective Labs: Basic Metabolic Panel: Recent Labs  Lab 12/15/22 1204 12/17/22 0428 12/18/22 1337  NA 139 137 136  K 3.6 3.8 4.3  CL 102 97* 99  CO2 25 28 27   GLUCOSE 147* 141* 260*  BUN 20 16 29*  CREATININE 2.05* 2.34* 3.11*  CALCIUM 8.6* 8.5* 8.4*  PHOS 3.1 2.5 2.7   Liver Function Tests: Recent Labs  Lab 12/14/22 0501 12/15/22 1204 12/17/22 0428 12/18/22 1337  AST 27  --   --   --   ALT 23  --   --   --   ALKPHOS 47  --   --   --   BILITOT 0.3  --   --   --   PROT 6.2*  --   --   --   ALBUMIN 3.2* 3.6 3.1* 3.0*   CBC: Recent Labs  Lab 12/15/22 1204 12/17/22 0428 12/18/22 0602 12/19/22 0247 12/20/22 0228  WBC 9.2 9.6 9.0 11.2* 8.4  HGB 10.3* 8.6* 8.7* 8.7* 9.3*  HCT 30.6* 26.1* 27.3* 28.0* 28.4*  MCV 91.3 93.5 95.5 97.9 95.3  PLT 163 133* 129* 130* 146*   Blood Culture    Component  Value Date/Time   SDES GALL BLADDER 12/16/2022 1519   SPECREQUEST NONE 12/16/2022 1519   CULT  12/16/2022 1519    NO GROWTH 3 DAYS NO ANAEROBES ISOLATED; CULTURE IN PROGRESS FOR 5 DAYS Performed at [facility], [address], [place], [place] [address]    REPTSTATUS PENDING 12/16/2022 1519    CBG: Recent Labs  Lab 12/19/22 0746 12/19/22 1128 12/19/22 1634 12/19/22 2042 12/20/22 0011  GLUCAP 219* 122* 91 142* 143*    Studies/Results: VAS [place] UPPER EXT VEIN MAPPING (PRE-OP AVF)  Result Date: 12/19/2022 UPPER EXTREMITY VEIN MAPPING Patient Name:  Samantha [name]  Date of Exam:   12/19/2022 Medical Rec #: [id number]        Accession #:    [id number] Date of Birth: [birth year]        Patient Gender: F Patient Age:   77 years Exam Location:  [facility] Procedure:      VAS [place] UPPER EXT VEIN MAPPING (PRE-OP AVF) Referring Phys: Graceann Congress --------------------------------------------------------------------------------  Indications: Pre-access. Limitations: Multiple IVs and bandages in the left arm. Patient would not extend              left arm Comparison Study: No prior study on file Performing Technologist: Sherren Kerns RVS  Examination Guidelines: A complete evaluation includes B-mode imaging, spectral Doppler, color Doppler, and power Doppler as needed of all accessible portions of each vessel. Bilateral testing is considered an integral part of a complete examination. Limited examinations for reoccurring indications may be performed as noted. +-----------------+-------------+----------+---------+ Right Cephalic   Diameter (cm)Depth (cm)Findings  +-----------------+-------------+----------+---------+ Prox upper arm       0.24        0.83             +-----------------+-------------+----------+---------+ Mid upper arm        0.15        0.92             +-----------------+-------------+----------+---------+ Dist upper arm       0.23        0.44              +-----------------+-------------+----------+---------+ Antecubital fossa    0.26        0.26             +-----------------+-------------+----------+---------+ Prox forearm         0.16        0.50   branching +-----------------+-------------+----------+---------+ Mid forearm          0.16        0.21             +-----------------+-------------+----------+---------+ Dist forearm         0.16        0.16             +-----------------+-------------+----------+---------+ +-----------------+-------------+----------+--------+ Right Basilic    Diameter (cm)Depth (cm)Findings +-----------------+-------------+----------+--------+ Mid upper arm        0.37        0.24    origin  +-----------------+-------------+----------+--------+ Dist upper arm       0.39        2.02            +-----------------+-------------+----------+--------+ Antecubital fossa    0.35        1.83            +-----------------+-------------+----------+--------+ Prox forearm         0.33        0.97            +-----------------+-------------+----------+--------+ Mid forearm          0.19        0.74            +-----------------+-------------+----------+--------+ Distal forearm       0.16        0.68            +-----------------+-------------+----------+--------+ +-----------------+-------------+----------+------------------------------+ Left Cephalic    Diameter (cm)Depth (cm)           Findings            +-----------------+-------------+----------+------------------------------+ Prox upper arm       0.31        1.06                                  +-----------------+-------------+----------+------------------------------+ Mid upper arm        0.32        0.83                                  +-----------------+-------------+----------+------------------------------+  Dist upper arm                          not visualized and IV/bandages  +-----------------+-------------+----------+------------------------------+ Antecubital fossa                       not visualized and IV/bandages +-----------------+-------------+----------+------------------------------+ Prox forearm                            not visualized and IV/bandages +-----------------+-------------+----------+------------------------------+ Mid forearm                             not visualized and IV/bandages +-----------------+-------------+----------+------------------------------+ Dist forearm                            not visualized and IV/bandages +-----------------+-------------+----------+------------------------------+ Wrist                                            IV/bandages           +-----------------+-------------+----------+------------------------------+ +-----------------+-------------+----------+------------------------------+ Left Basilic     Diameter (cm)Depth (cm)           Findings            +-----------------+-------------+----------+------------------------------+ Prox upper arm                          not visualized and IV/bandages +-----------------+-------------+----------+------------------------------+ Mid upper arm                           not visualized and IV/bandages +-----------------+-------------+----------+------------------------------+ Dist upper arm                          not visualized and IV/bandages +-----------------+-------------+----------+------------------------------+ Antecubital fossa                       not visualized and IV/bandages +-----------------+-------------+----------+------------------------------+ Prox forearm                            not visualized and IV/bandages +-----------------+-------------+----------+------------------------------+ Mid forearm                             not visualized and IV/bandages  +-----------------+-------------+----------+------------------------------+ Distal forearm                          not visualized and IV/bandages +-----------------+-------------+----------+------------------------------+ Elbow                                            IV/bandages           +-----------------+-------------+----------+------------------------------+ Wrist                                   not visualized and IV/bandages +-----------------+-------------+----------+------------------------------+ Unable to visualize the basilic and the majority of the cephalic veins secondary to multiple IV's and the  patient's refusal to extend arm *See table(s) above for measurements and observations.  Diagnosing physician: Heath Lark Electronically signed by Heath Lark on 12/19/2022 at 1:34:40 PM.    Final    ECHOCARDIOGRAM COMPLETE  Result Date: 12/19/2022    ECHOCARDIOGRAM REPORT   Patient Name:   Samantha [name] Date of Exam: 12/19/2022 Medical Rec #:  [id number]       Height:       59.0 in Accession #:    [id number]      Weight:       150.4 lb Date of Birth:  [birth year]       BSA:          1.634 m Patient Age:    77 years        BP:           150/57 mmHg Patient Gender: F               HR:           102 bpm. Exam Location:  Inpatient Procedure: 2D Echo, Cardiac Doppler, Color Doppler and Intracardiac            Opacification Agent Indications:    dyspnea  History:        Patient has no prior history of Echocardiogram examinations.                 Stroke; Risk Factors:Diabetes and Hypertension.  Sonographer:    Mike Gip Referring Phys: [id number] Lean GHIMIRE IMPRESSIONS  1. Left ventricular ejection fraction, by estimation, is 40 to 45%. The left ventricle has mildly decreased function. The left ventricle demonstrates regional wall motion abnormalities (see scoring diagram/findings for description). Indeterminate diastolic filling due to E-A fusion. There is moderate hypokinesis of the  left ventricular, basal-mid lateral wall and inferolateral wall.  2. Right ventricular systolic function is normal. The right ventricular size is normal. Tricuspid regurgitation signal is inadequate for assessing PA pressure.  3. The mitral valve is normal in structure. Mild mitral valve regurgitation.  4. The aortic valve is tricuspid. Aortic valve regurgitation is not visualized. No aortic stenosis is present. FINDINGS  Left Ventricle: Left ventricular ejection fraction, by estimation, is 40 to 45%. The left ventricle has mildly decreased function. The left ventricle demonstrates regional wall motion abnormalities. Moderate hypokinesis of the left ventricular, basal-mid lateral wall and inferolateral wall. Definity contrast agent was given IV to delineate the left ventricular endocardial borders. The left ventricular internal cavity size was normal in size. There is no left ventricular hypertrophy. Indeterminate diastolic filling due to E-A fusion.  LV Wall Scoring: The antero-lateral wall and posterior wall are hypokinetic. Right Ventricle: The right ventricular size is normal. No increase in right ventricular wall thickness. Right ventricular systolic function is normal. Tricuspid regurgitation signal is inadequate for assessing PA pressure. Left [facility]: Left atrial size was normal in size. Right [facility]: Right atrial size was normal in size. Pericardium: There is no evidence of pericardial effusion. Mitral Valve: The mitral valve is normal in structure. Mild mitral valve regurgitation, with centrally-directed jet. Tricuspid Valve: The tricuspid valve is normal in structure. Tricuspid valve regurgitation is not demonstrated. Aortic Valve: The aortic valve is tricuspid. Aortic valve regurgitation is not visualized. No aortic stenosis is present. Pulmonic Valve: The pulmonic valve was normal in structure. Pulmonic valve regurgitation is not visualized. Aorta: The aortic root and ascending aorta are structurally  normal, with no evidence of dilitation. IAS/Shunts: The interatrial septum was not well  visualized.  LEFT VENTRICLE PLAX 2D LVIDd:         4.80 cm      Diastology LVIDs:         4.00 cm      LV e' medial:    8.05 cm/s LV PW:         0.70 cm      LV E/e' medial:  14.9 LV IVS:        1.00 cm      LV e' lateral:   6.74 cm/s LVOT diam:     2.00 cm      LV E/e' lateral: 17.8 LV SV:         43 LV SV Index:   26 LVOT Area:     3.14 cm  LV Volumes (MOD) LV vol d, MOD A2C: 133.0 ml LV vol d, MOD A4C: 124.0 ml LV vol s, MOD A2C: 66.3 ml LV vol s, MOD A4C: 74.5 ml LV SV MOD A2C:     66.7 ml LV SV MOD A4C:     124.0 ml LV SV MOD BP:      58.0 ml RIGHT VENTRICLE RV Basal diam:  2.90 cm RV S prime:     6.53 cm/s LEFT ATRIUM             Index LA diam:        3.00 cm 1.84 cm/m LA Vol (A2C):   35.0 ml 21.42 ml/m LA Vol (A4C):   34.0 ml 20.81 ml/m LA Biplane Vol: 34.6 ml 21.18 ml/m  AORTIC VALVE LVOT Vmax:   77.40 cm/s LVOT Vmean:  49.300 cm/s LVOT VTI:    0.136 m  AORTA Ao Root diam: 2.70 cm Ao Asc diam:  2.80 cm MITRAL VALVE MV Area (PHT): 6.27 cm     SHUNTS MV Decel Time: 121 msec     Systemic VTI:  0.14 m MV E velocity: 120.00 cm/s  Systemic Diam: 2.00 cm Mihai Croitoru MD Electronically signed by Thurmon Fair MD Signature Date/Time: 12/19/2022/11:38:14 AM    Final     Medications:  heparin 800 Units/hr (12/19/22 1813)    carvedilol  6.25 mg Oral BID WC   Chlorhexidine Gluconate Cloth  6 each Topical Q0600   darbepoetin (ARANESP) injection - DIALYSIS  60 mcg Subcutaneous Q Wed-1800   feeding supplement (OSMOLITE 1.5 CAL)  480 mL Per Tube Q24H   insulin aspart  0-9 Units Subcutaneous TID WC   insulin glargine-yfgn  18 Units Subcutaneous QHS   multivitamin  1 tablet Oral QHS   sodium chloride flush  5 mL Intracatheter Q8H    Dialysis Orders: OP HD = orders that were entered for Adm  Farm/SW=3 hours 15 minutes- BFR 350 TDC- heparin 1500 units then 750 mid run    Problem/Plan: Abdominal pain 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 Completed ABX. Has PEG.  ESRD -HD in hospital MWF (moved from [place]- has not officially started HD at [facility]) prior 3 hours 15 minutes as outpatient order will use 3.5 hrs  for now, likely needs to be 3h27min on d/c.  Agreed to permanent access placement - [facility]VS consulted, considered marginal surgical candidate, may be better suited with perm cath per notes.  Appreciate their assistance.  Renal navigator working on outpatient HD placement near SNF. HTN/volume - BP variable, in goal today, hypotension on HD. On coreg 6.25mg  BID only- apparently on 4 meds including HCTZ at home - does  not appear to require these medications now that close to dry. Does not appear grossly overloaded.  Dry weight likely around 63-64kg per weights  UF as tolerated.  Anemia - Hgb improving, up to 9.3. tsat 90%/Ferritin 3281 - no IV iron. Continue Aranesp qwk, started 4/22.  Secondary hyperparathyroidism -low phosphorus corrected with supplement to 2.5  calcium corrected stable, PTH 321 .  No vitamin D currently Nutrition-ALB 3.1, on Osmolite supplement /renal diet  Deconditioning/history of CVA- for nursing home placement/needing recliner HD tomorrow//noted palliative care seeing with family meeting with 4 children  appreciate help discussing goals of care full code currently  Virgina Norfolk, PA-C [facility] 12/20/2022,8:15 AM  LOS: 8 days   Patient seen and examined, agree with above note with above modifications. Pleasant-  NAD- abdominal issues continue-  planning for HD tomorrow on schedule-  also cont with appropriate titration of HD related medications. Vein mapping has been done but VVS concerned about her candidacy for surgery-  I understand but family has possibly some unrealistic expectations-  hoping eventually to bring her home-  they dont see her QOL as possibly being substandard  Annie Sable,  MD 12/20/2022

## 2022-12-21 DIAGNOSIS — R1013 Epigastric pain: Secondary | ICD-10-CM | POA: Diagnosis not present

## 2022-12-21 DIAGNOSIS — I10 Essential (primary) hypertension: Secondary | ICD-10-CM | POA: Diagnosis not present

## 2022-12-21 DIAGNOSIS — K810 Acute cholecystitis: Secondary | ICD-10-CM | POA: Diagnosis not present

## 2022-12-21 DIAGNOSIS — E119 Type 2 diabetes mellitus without complications: Secondary | ICD-10-CM | POA: Diagnosis not present

## 2022-12-21 LAB — CBC
HCT: 26.1 % — ABNORMAL LOW (ref 36.0–46.0)
Hemoglobin: 8.2 g/dL — ABNORMAL LOW (ref 12.0–15.0)
MCH: 31.2 pg (ref 26.0–34.0)
MCHC: 31.4 g/dL (ref 30.0–36.0)
MCV: 99.2 fL (ref 80.0–100.0)
Platelets: 144 10*3/uL — ABNORMAL LOW (ref 150–400)
RBC: 2.63 MIL/uL — ABNORMAL LOW (ref 3.87–5.11)
RDW: 21.2 % — ABNORMAL HIGH (ref 11.5–15.5)
WBC: 7.2 10*3/uL (ref 4.0–10.5)
nRBC: 0.7 % — ABNORMAL HIGH (ref 0.0–0.2)

## 2022-12-21 LAB — RENAL FUNCTION PANEL
Albumin: 3 g/dL — ABNORMAL LOW (ref 3.5–5.0)
Anion gap: 10 (ref 5–15)
BUN: 31 mg/dL — ABNORMAL HIGH (ref 8–23)
CO2: 28 mmol/L (ref 22–32)
Calcium: 8.7 mg/dL — ABNORMAL LOW (ref 8.9–10.3)
Chloride: 99 mmol/L (ref 98–111)
Creatinine, Ser: 3.19 mg/dL — ABNORMAL HIGH (ref 0.44–1.00)
GFR, Estimated: 15 mL/min — ABNORMAL LOW (ref >60)
Glucose, Bld: 252 mg/dL — ABNORMAL HIGH (ref 70–99)
Phosphorus: 3.7 mg/dL (ref 2.5–4.6)
Potassium: 4.8 mmol/L (ref 3.5–5.1)
Sodium: 137 mmol/L (ref 135–145)

## 2022-12-21 LAB — AEROBIC/ANAEROBIC CULTURE W GRAM STAIN (SURGICAL/DEEP WOUND)

## 2022-12-21 LAB — GLUCOSE, CAPILLARY
Glucose-Capillary: 230 mg/dL — ABNORMAL HIGH (ref 70–99)
Glucose-Capillary: 145 mg/dL — ABNORMAL HIGH (ref 70–99)
Glucose-Capillary: 184 mg/dL — ABNORMAL HIGH (ref 70–99)
Glucose-Capillary: 169 mg/dL — ABNORMAL HIGH (ref 70–99)

## 2022-12-21 MED ORDER — PENTAFLUOROPROP-TETRAFLUOROETH EX AERO: 1.0000 | INHALATION_SPRAY | CUTANEOUS | Status: DC | PRN

## 2022-12-21 MED ORDER — HEPARIN SODIUM (PORCINE) 1000 UNIT/ML DIALYSIS: 1000.0000 [IU] | INTRAMUSCULAR | Status: DC | PRN

## 2022-12-21 MED ORDER — LIDOCAINE HCL (PF) 1 % IJ SOLN: 5.0000 mL | INTRAMUSCULAR | Status: DC | PRN

## 2022-12-21 MED ORDER — LIDOCAINE-PRILOCAINE 2.5-2.5 % EX CREA: 1.0000 | TOPICAL_CREAM | CUTANEOUS | Status: DC | PRN

## 2022-12-21 MED ORDER — INSULIN GLARGINE-YFGN 100 UNIT/ML ~~LOC~~ SOLN
16.0000 [IU] | Freq: Every day | SUBCUTANEOUS | Status: DC
  Administered 2022-12-21 – 2022-12-24 (×4): 16 [IU] via SUBCUTANEOUS
  Filled 2022-12-21 (×5): qty 0.16

## 2022-12-21 MED ORDER — HEPARIN SODIUM (PORCINE) 1000 UNIT/ML DIALYSIS
1500.0000 [IU] | INTRAMUSCULAR | Status: DC | PRN
  Administered 2022-12-21: 1500 [IU] via INTRAVENOUS_CENTRAL
  Filled 2022-12-21: qty 2

## 2022-12-21 MED ORDER — ALTEPLASE 2 MG IJ SOLR: 2.0000 mg | Freq: Once | INTRAMUSCULAR | Status: DC | PRN

## 2022-12-21 MED ORDER — ANTICOAGULANT SODIUM CITRATE 4% (200MG/5ML) IV SOLN: 5.0000 mL | Status: DC | PRN

## 2022-12-21 NOTE — Progress Notes (Signed)
Spoke to [employer] with [facility] this am. Pt's referral is still pending. Faxed Hep B total core antibody lab to [place] this am. Will assist as needed.   Olivia Canter Renal Navigator [phone]

## 2022-12-21 NOTE — Progress Notes (Signed)
[facility] Progress Note   Subjective:   Seen on dialysis. No concerns today, denies SOB, CP, dizziness, nausea.   Objective Vitals:   12/21/22 0613 12/21/22 0751 12/21/22 0817 12/21/22 0821  BP: (!) 142/53 (!) 146/66  (!) 153/60  Pulse: 90 91  90  Resp: 17 17  18   Temp: 98.1 F (36.7 C)   97.6 F (36.4 C)  TempSrc: Oral   Oral  SpO2:    97%  Weight:   64.2 kg   Height:       Physical Exam General: Alert, pleasant female. NAD Heart: RRR, no murmurs, rubs or gallops Lungs: CTA bilaterally, on RA Abdomen: Soft, non-distended, +BS Extremities: No edema b/l lower extremities Dialysis Access:  [facility] accessed  Additional Objective Labs: Basic Metabolic Panel: Recent Labs  Lab 12/15/22 1204 12/17/22 0428 12/18/22 1337  NA 139 137 136  K 3.6 3.8 4.3  CL 102 97* 99  CO2 25 28 27   GLUCOSE 147* 141* 260*  BUN 20 16 29*  CREATININE 2.05* 2.34* 3.11*  CALCIUM 8.6* 8.5* 8.4*  PHOS 3.1 2.5 2.7   Liver Function Tests: Recent Labs  Lab 12/15/22 1204 12/17/22 0428 12/18/22 1337  ALBUMIN 3.6 3.1* 3.0*   No results for input(s): "LIPASE", "AMYLASE" in the last 168 hours. CBC: Recent Labs  Lab 12/15/22 1204 12/17/22 0428 12/18/22 0602 12/19/22 0247 12/20/22 0228  WBC 9.2 9.6 9.0 11.2* 8.4  HGB 10.3* 8.6* 8.7* 8.7* 9.3*  HCT 30.6* 26.1* 27.3* 28.0* 28.4*  MCV 91.3 93.5 95.5 97.9 95.3  PLT 163 133* 129* 130* 146*   Blood Culture    Component Value Date/Time   SDES GALL BLADDER 12/16/2022 1519   SPECREQUEST NONE 12/16/2022 1519   CULT  12/16/2022 1519    NO GROWTH 4 DAYS NO ANAEROBES ISOLATED; CULTURE IN PROGRESS FOR 5 DAYS Performed at [facility], [address], [place], [place] [address]    REPTSTATUS PENDING 12/16/2022 1519    Cardiac Enzymes: No results for input(s): "CKTOTAL", "CKMB", "CKMBINDEX", "TROPONINI" in the last 168 hours. CBG: Recent Labs  Lab 12/20/22 1157 12/20/22 1604 12/20/22 2018 12/20/22 2130 12/21/22 0751   GLUCAP 147* 102* 77 88 230*   Iron Studies: No results for input(s): "IRON", "TIBC", "TRANSFERRIN", "FERRITIN" in the last 72 hours. @lablastinr3 @ Studies/Results: VAS [place] UPPER EXT VEIN MAPPING (PRE-OP AVF)  Result Date: 12/19/2022 UPPER EXTREMITY VEIN MAPPING Patient Name:  CARYNN FELLING  Date of Exam:   12/19/2022 Medical Rec #: [id number]        Accession #:    [id number] Date of Birth: 1946-06-10        Patient Gender: F Patient Age:   77 years Exam Location:  [facility] Procedure:      VAS [place] UPPER EXT VEIN MAPPING (PRE-OP AVF) Referring Phys: Graceann Congress --------------------------------------------------------------------------------  Indications: Pre-access. Limitations: Multiple IVs and bandages in the left arm. Patient would not extend              left arm Comparison Study: No prior study on file Performing Technologist: Sherren Kerns RVS  Examination Guidelines: A complete evaluation includes B-mode imaging, spectral Doppler, color Doppler, and power Doppler as needed of all accessible portions of each vessel. Bilateral testing is considered an integral part of a complete examination. Limited examinations for reoccurring indications may be performed as noted. +-----------------+-------------+----------+---------+ Right Cephalic   Diameter (cm)Depth (cm)Findings  +-----------------+-------------+----------+---------+ Prox upper arm       0.24  0.83             +-----------------+-------------+----------+---------+ Mid upper arm        0.15        0.92             +-----------------+-------------+----------+---------+ Dist upper arm       0.23        0.44             +-----------------+-------------+----------+---------+ Antecubital fossa    0.26        0.26             +-----------------+-------------+----------+---------+ Prox forearm         0.16        0.50   branching +-----------------+-------------+----------+---------+ Mid forearm           0.16        0.21             +-----------------+-------------+----------+---------+ Dist forearm         0.16        0.16             +-----------------+-------------+----------+---------+ +-----------------+-------------+----------+--------+ Right Basilic    Diameter (cm)Depth (cm)Findings +-----------------+-------------+----------+--------+ Mid upper arm        0.37        0.24    origin  +-----------------+-------------+----------+--------+ Dist upper arm       0.39        2.02            +-----------------+-------------+----------+--------+ Antecubital fossa    0.35        1.83            +-----------------+-------------+----------+--------+ Prox forearm         0.33        0.97            +-----------------+-------------+----------+--------+ Mid forearm          0.19        0.74            +-----------------+-------------+----------+--------+ Distal forearm       0.16        0.68            +-----------------+-------------+----------+--------+ +-----------------+-------------+----------+------------------------------+ Left Cephalic    Diameter (cm)Depth (cm)           Findings            +-----------------+-------------+----------+------------------------------+ Prox upper arm       0.31        1.06                                  +-----------------+-------------+----------+------------------------------+ Mid upper arm        0.32        0.83                                  +-----------------+-------------+----------+------------------------------+ Dist upper arm                          not visualized and IV/bandages +-----------------+-------------+----------+------------------------------+ Antecubital fossa                       not visualized and IV/bandages +-----------------+-------------+----------+------------------------------+ Prox forearm  not visualized and IV/bandages  +-----------------+-------------+----------+------------------------------+ Mid forearm                             not visualized and IV/bandages +-----------------+-------------+----------+------------------------------+ Dist forearm                            not visualized and IV/bandages +-----------------+-------------+----------+------------------------------+ Wrist                                            IV/bandages           +-----------------+-------------+----------+------------------------------+ +-----------------+-------------+----------+------------------------------+ Left Basilic     Diameter (cm)Depth (cm)           Findings            +-----------------+-------------+----------+------------------------------+ Prox upper arm                          not visualized and IV/bandages +-----------------+-------------+----------+------------------------------+ Mid upper arm                           not visualized and IV/bandages +-----------------+-------------+----------+------------------------------+ Dist upper arm                          not visualized and IV/bandages +-----------------+-------------+----------+------------------------------+ Antecubital fossa                       not visualized and IV/bandages +-----------------+-------------+----------+------------------------------+ Prox forearm                            not visualized and IV/bandages +-----------------+-------------+----------+------------------------------+ Mid forearm                             not visualized and IV/bandages +-----------------+-------------+----------+------------------------------+ Distal forearm                          not visualized and IV/bandages +-----------------+-------------+----------+------------------------------+ Elbow                                            IV/bandages            +-----------------+-------------+----------+------------------------------+ Wrist                                   not visualized and IV/bandages +-----------------+-------------+----------+------------------------------+ Unable to visualize the basilic and the majority of the cephalic veins secondary to multiple IV's and the patient's refusal to extend arm *See table(s) above for measurements and observations.  Diagnosing physician: Heath Lark Electronically signed by Heath Lark on 12/19/2022 at 1:34:40 PM.    Final    ECHOCARDIOGRAM COMPLETE  Result Date: 12/19/2022    ECHOCARDIOGRAM REPORT   Patient Name:   Samantha [name] Date of Exam: 12/19/2022 Medical Rec #:  [id number]       Height:       59.0 in Accession #:    [id number]      Weight:  150.4 lb Date of Birth:  10/31/1945       BSA:          1.634 m Patient Age:    77 years        BP:           150/57 mmHg Patient Gender: F               HR:           102 bpm. Exam Location:  Inpatient Procedure: 2D Echo, Cardiac Doppler, Color Doppler and Intracardiac            Opacification Agent Indications:    dyspnea  History:        Patient has no prior history of Echocardiogram examinations.                 Stroke; Risk Factors:Diabetes and Hypertension.  Sonographer:    Mike Gip Referring Phys: [id number] Werner Lean GHIMIRE IMPRESSIONS  1. Left ventricular ejection fraction, by estimation, is 40 to 45%. The left ventricle has mildly decreased function. The left ventricle demonstrates regional wall motion abnormalities (see scoring diagram/findings for description). Indeterminate diastolic filling due to E-A fusion. There is moderate hypokinesis of the left ventricular, basal-mid lateral wall and inferolateral wall.  2. Right ventricular systolic function is normal. The right ventricular size is normal. Tricuspid regurgitation signal is inadequate for assessing PA pressure.  3. The mitral valve is normal in structure. Mild mitral valve regurgitation.   4. The aortic valve is tricuspid. Aortic valve regurgitation is not visualized. No aortic stenosis is present. FINDINGS  Left Ventricle: Left ventricular ejection fraction, by estimation, is 40 to 45%. The left ventricle has mildly decreased function. The left ventricle demonstrates regional wall motion abnormalities. Moderate hypokinesis of the left ventricular, basal-mid lateral wall and inferolateral wall. Definity contrast agent was given IV to delineate the left ventricular endocardial borders. The left ventricular internal cavity size was normal in size. There is no left ventricular hypertrophy. Indeterminate diastolic filling due to E-A fusion.  LV Wall Scoring: The antero-lateral wall and posterior wall are hypokinetic. Right Ventricle: The right ventricular size is normal. No increase in right ventricular wall thickness. Right ventricular systolic function is normal. Tricuspid regurgitation signal is inadequate for assessing PA pressure. Left Atrium: Left atrial size was normal in size. Right Atrium: Right atrial size was normal in size. Pericardium: There is no evidence of pericardial effusion. Mitral Valve: The mitral valve is normal in structure. Mild mitral valve regurgitation, with centrally-directed jet. Tricuspid Valve: The tricuspid valve is normal in structure. Tricuspid valve regurgitation is not demonstrated. Aortic Valve: The aortic valve is tricuspid. Aortic valve regurgitation is not visualized. No aortic stenosis is present. Pulmonic Valve: The pulmonic valve was normal in structure. Pulmonic valve regurgitation is not visualized. Aorta: The aortic root and ascending aorta are structurally normal, with no evidence of dilitation. IAS/Shunts: The interatrial septum was not well visualized.  LEFT VENTRICLE PLAX 2D LVIDd:         4.80 cm      Diastology LVIDs:         4.00 cm      LV e' medial:    8.05 cm/s LV PW:         0.70 cm      LV E/e' medial:  14.9 LV IVS:        1.00 cm      LV e'  lateral:   6.74 cm/s LVOT diam:  2.00 cm      LV E/e' lateral: 17.8 LV SV:         43 LV SV Index:   26 LVOT Area:     3.14 cm  LV Volumes (MOD) LV vol d, MOD A2C: 133.0 ml LV vol d, MOD A4C: 124.0 ml LV vol s, MOD A2C: 66.3 ml LV vol s, MOD A4C: 74.5 ml LV SV MOD A2C:     66.7 ml LV SV MOD A4C:     124.0 ml LV SV MOD BP:      58.0 ml RIGHT VENTRICLE RV Basal diam:  2.90 cm RV S prime:     6.53 cm/s LEFT ATRIUM             Index LA diam:        3.00 cm 1.84 cm/m LA Vol (A2C):   35.0 ml 21.42 ml/m LA Vol (A4C):   34.0 ml 20.81 ml/m LA Biplane Vol: 34.6 ml 21.18 ml/m  AORTIC VALVE LVOT Vmax:   77.40 cm/s LVOT Vmean:  49.300 cm/s LVOT VTI:    0.136 m  AORTA Ao Root diam: 2.70 cm Ao Asc diam:  2.80 cm MITRAL VALVE MV Area (PHT): 6.27 cm     SHUNTS MV Decel Time: 121 msec     Systemic VTI:  0.14 m MV E velocity: 120.00 cm/s  Systemic Diam: 2.00 cm Mihai Croitoru MD Electronically signed by Thurmon Fair MD Signature Date/Time: 12/19/2022/11:38:14 AM    Final    Medications:  anticoagulant sodium citrate      apixaban  5 mg Oral BID   carvedilol  6.25 mg Oral BID WC   Chlorhexidine Gluconate Cloth  6 each Topical Q0600   Chlorhexidine Gluconate Cloth  6 each Topical Q0600   darbepoetin (ARANESP) injection - DIALYSIS  60 mcg Subcutaneous Q Wed-1800   feeding supplement (OSMOLITE 1.5 CAL)  480 mL Per Tube Q24H   insulin aspart  0-9 Units Subcutaneous TID WC   insulin glargine-yfgn  16 Units Subcutaneous QHS   losartan  25 mg Oral Daily   multivitamin  1 tablet Oral QHS   sodium chloride flush  5 mL Intracatheter Q8H    Dialysis Orders: OP HD = orders that were entered for [facility]=3 hours 15 minutes- BFR 350 TDC- heparin 1500 units then 750 mid run   Assessment/Plan: Abdominal pain workup for cholestasis- surgery involved, status post cholecystostomy tube placement by IR. Completed ABX. Has PEG.  ESRD -HD in hospital MWF (moved from [place]- has not officially started HD at [facility]). Likely  needs to be 3h42min on d/c.  Agreed to permanent access placement - VVS consulted, considered marginal surgical candidate, may be better suited with perm cath per notes.  Appreciate their assistance.  Renal navigator working on outpatient HD placement near SNF. HTN/volume - BP variable, elevated today but tends to be hypotension on HD. On coreg 6.25mg  BID only- apparently on 4 meds including HCTZ at home - does not appear to require these medications now that close to dry. Does not appear grossly overloaded.  Dry weight likely around 63-64kg per weights  UF as tolerated.  Anemia - Hgb improving, up to 9.3. tsat 90%/Ferritin 3281 - no IV iron. Continue Aranesp qwk, started 4/22.  Secondary hyperparathyroidism -low phosphorus corrected with supplement to 2.5  calcium corrected stable, PTH 321 .  No vitamin D currently Nutrition-ALB 3.1, on Osmolite supplement /renal diet  Deconditioning/history of CVA- for nursing home placement. She  is in bed today, will need to make sure she can tolerate HD in recliner before she discharges.Palliative care involved.   Rogers Blocker, PA-C 12/21/2022, 8:23 AM  [facility] Pager: [phone]

## 2022-12-21 NOTE — Progress Notes (Signed)
Referring Physician(s): Hosie Spangle  Supervising Physician: Pernell Dupre  Patient Status:  [name] - In-pt  Chief Complaint: CHolecystitis  Subjective:  Pt resting in bed. She states she is feeling better today. She denies abd pain, N/V. She is eating/drinking normally. She endorses mild tenderness to insertion site.   Allergies: Patient has no known allergies.  Medications: Prior to Admission medications   Medication Sig Start Date End Date Taking? Authorizing Provider  amLODipine (NORVASC) 10 MG tablet Take 10 mg by mouth daily.   Yes [provider]  bisacodyl (DULCOLAX) 5 MG EC tablet Take 5 mg by mouth daily as needed for moderate constipation.   Yes [provider]  carvedilol (COREG) 25 MG tablet Take 25 mg by mouth 2 (two) times daily with a meal.   Yes [provider]  cholecalciferol (VITAMIN D) 1000 UNITS tablet Take 1,000 Units by mouth daily.   Yes [provider]  Cod Liver Oil 1000 MG CAPS Take 1 capsule by mouth daily.   Yes [provider]  insulin glargine (LANTUS) 100 UNIT/ML injection Inject 25 Units into the skin at bedtime.   Yes [provider]  insulin lispro (HUMALOG) 100 UNIT/ML injection Inject 19 Units into the skin 2 (two) times daily. 12 units before lunch and 7 units before supper   Yes [provider]  losartan-hydrochlorothiazide (HYZAAR) 100-25 MG per tablet Take 1 tablet by mouth daily.   Yes [provider]  traMADol (ULTRAM) 50 MG tablet Take 1 tablet (50 mg total) by mouth every 6 (six) hours as needed. 11/08/13  Yes Junious Silk, PA-C     Vital Signs: BP (!) 143/65 (BP Location: Left Leg)   Pulse (!) 110   Temp 97.8 F (36.6 C) (Oral)   Resp 17   Ht 4\' 11"  (1.499 m)   Wt 138 lb 14.2 oz (63 kg)   SpO2 100%   BMI 28.05 kg/m   Physical Exam Vitals reviewed.  Constitutional:      General: She is not in acute distress.    Appearance: She is  ill-appearing.  HENT:     Head: Normocephalic and atraumatic.  Pulmonary:     Effort: Pulmonary effort is normal. No respiratory distress.  Abdominal:     Comments: Drain flushes/aspirates easily. Site unremarkable with sutures/statlock in place. ~ 100 cc clear,yellow OP in gravity bag. Dressing C/D/I.    Skin:    General: Skin is warm and dry.  Neurological:     Mental Status: She is alert.  Psychiatric:        Mood and Affect: Mood normal.        Behavior: Behavior normal.        Thought Content: Thought content normal.        Judgment: Judgment normal.     Imaging: VAS [place] UPPER EXT VEIN MAPPING (PRE-OP AVF)  Result Date: 12/19/2022 UPPER EXTREMITY VEIN MAPPING Patient Name:  Samantha [name]  Date of Exam:   12/19/2022 Medical Rec #: [id number]        Accession #:    [id number] Date of Birth: [birth year]        Patient Gender: F Patient Age:   77 years Exam Location:  [facility] Procedure:      VAS [place] UPPER EXT VEIN MAPPING (PRE-OP AVF) Referring Phys: Graceann Congress --------------------------------------------------------------------------------  Indications: Pre-access. Limitations: Multiple IVs and bandages in the left arm. Patient would not extend  left arm Comparison Study: No prior study on file Performing Technologist: Sherren Kerns RVS  Examination Guidelines: A complete evaluation includes B-mode imaging, spectral Doppler, color Doppler, and power Doppler as needed of all accessible portions of each vessel. Bilateral testing is considered an integral part of a complete examination. Limited examinations for reoccurring indications may be performed as noted. +-----------------+-------------+----------+---------+ Right Cephalic   Diameter (cm)Depth (cm)Findings  +-----------------+-------------+----------+---------+ Prox upper arm       0.24        0.83             +-----------------+-------------+----------+---------+ Mid upper arm        0.15         0.92             +-----------------+-------------+----------+---------+ Dist upper arm       0.23        0.44             +-----------------+-------------+----------+---------+ Antecubital fossa    0.26        0.26             +-----------------+-------------+----------+---------+ Prox forearm         0.16        0.50   branching +-----------------+-------------+----------+---------+ Mid forearm          0.16        0.21             +-----------------+-------------+----------+---------+ Dist forearm         0.16        0.16             +-----------------+-------------+----------+---------+ +-----------------+-------------+----------+--------+ Right Basilic    Diameter (cm)Depth (cm)Findings +-----------------+-------------+----------+--------+ Mid upper arm        0.37        0.24    origin  +-----------------+-------------+----------+--------+ Dist upper arm       0.39        2.02            +-----------------+-------------+----------+--------+ Antecubital fossa    0.35        1.83            +-----------------+-------------+----------+--------+ Prox forearm         0.33        0.97            +-----------------+-------------+----------+--------+ Mid forearm          0.19        0.74            +-----------------+-------------+----------+--------+ Distal forearm       0.16        0.68            +-----------------+-------------+----------+--------+ +-----------------+-------------+----------+------------------------------+ Left Cephalic    Diameter (cm)Depth (cm)           Findings            +-----------------+-------------+----------+------------------------------+ Prox upper arm       0.31        1.06                                  +-----------------+-------------+----------+------------------------------+ Mid upper arm        0.32        0.83                                   +-----------------+-------------+----------+------------------------------+  Dist upper arm                          not visualized and IV/bandages +-----------------+-------------+----------+------------------------------+ Antecubital fossa                       not visualized and IV/bandages +-----------------+-------------+----------+------------------------------+ Prox forearm                            not visualized and IV/bandages +-----------------+-------------+----------+------------------------------+ Mid forearm                             not visualized and IV/bandages +-----------------+-------------+----------+------------------------------+ Dist forearm                            not visualized and IV/bandages +-----------------+-------------+----------+------------------------------+ Wrist                                            IV/bandages           +-----------------+-------------+----------+------------------------------+ +-----------------+-------------+----------+------------------------------+ Left Basilic     Diameter (cm)Depth (cm)           Findings            +-----------------+-------------+----------+------------------------------+ Prox upper arm                          not visualized and IV/bandages +-----------------+-------------+----------+------------------------------+ Mid upper arm                           not visualized and IV/bandages +-----------------+-------------+----------+------------------------------+ Dist upper arm                          not visualized and IV/bandages +-----------------+-------------+----------+------------------------------+ Antecubital fossa                       not visualized and IV/bandages +-----------------+-------------+----------+------------------------------+ Prox forearm                            not visualized and IV/bandages  +-----------------+-------------+----------+------------------------------+ Mid forearm                             not visualized and IV/bandages +-----------------+-------------+----------+------------------------------+ Distal forearm                          not visualized and IV/bandages +-----------------+-------------+----------+------------------------------+ Elbow                                            IV/bandages           +-----------------+-------------+----------+------------------------------+ Wrist                                   not visualized and IV/bandages +-----------------+-------------+----------+------------------------------+ Unable to visualize the basilic and the majority of the cephalic veins secondary to multiple IV's and the patient's  refusal to extend arm *See table(s) above for measurements and observations.  Diagnosing physician: Heath Lark Electronically signed by Heath Lark on 12/19/2022 at 1:34:40 PM.    Final    ECHOCARDIOGRAM COMPLETE  Result Date: 12/19/2022    ECHOCARDIOGRAM REPORT   Patient Name:   Samantha [name] Date of Exam: 12/19/2022 Medical Rec #:  [id number]       Height:       59.0 in Accession #:    [id number]      Weight:       150.4 lb Date of Birth:  1946/05/12       BSA:          1.634 m Patient Age:    77 years        BP:           150/57 mmHg Patient Gender: F               HR:           102 bpm. Exam Location:  Inpatient Procedure: 2D Echo, Cardiac Doppler, Color Doppler and Intracardiac            Opacification Agent Indications:    dyspnea  History:        Patient has no prior history of Echocardiogram examinations.                 Stroke; Risk Factors:Diabetes and Hypertension.  Sonographer:    Mike Gip Referring Phys: [id number] Werner Lean GHIMIRE IMPRESSIONS  1. Left ventricular ejection fraction, by estimation, is 40 to 45%. The left ventricle has mildly decreased function. The left ventricle demonstrates regional wall  motion abnormalities (see scoring diagram/findings for description). Indeterminate diastolic filling due to E-A fusion. There is moderate hypokinesis of the left ventricular, basal-mid lateral wall and inferolateral wall.  2. Right ventricular systolic function is normal. The right ventricular size is normal. Tricuspid regurgitation signal is inadequate for assessing PA pressure.  3. The mitral valve is normal in structure. Mild mitral valve regurgitation.  4. The aortic valve is tricuspid. Aortic valve regurgitation is not visualized. No aortic stenosis is present. FINDINGS  Left Ventricle: Left ventricular ejection fraction, by estimation, is 40 to 45%. The left ventricle has mildly decreased function. The left ventricle demonstrates regional wall motion abnormalities. Moderate hypokinesis of the left ventricular, basal-mid lateral wall and inferolateral wall. Definity contrast agent was given IV to delineate the left ventricular endocardial borders. The left ventricular internal cavity size was normal in size. There is no left ventricular hypertrophy. Indeterminate diastolic filling due to E-A fusion.  LV Wall Scoring: The antero-lateral wall and posterior wall are hypokinetic. Right Ventricle: The right ventricular size is normal. No increase in right ventricular wall thickness. Right ventricular systolic function is normal. Tricuspid regurgitation signal is inadequate for assessing PA pressure. Left Atrium: Left atrial size was normal in size. Right Atrium: Right atrial size was normal in size. Pericardium: There is no evidence of pericardial effusion. Mitral Valve: The mitral valve is normal in structure. Mild mitral valve regurgitation, with centrally-directed jet. Tricuspid Valve: The tricuspid valve is normal in structure. Tricuspid valve regurgitation is not demonstrated. Aortic Valve: The aortic valve is tricuspid. Aortic valve regurgitation is not visualized. No aortic stenosis is present. Pulmonic  Valve: The pulmonic valve was normal in structure. Pulmonic valve regurgitation is not visualized. Aorta: The aortic root and ascending aorta are structurally normal, with no evidence of dilitation. IAS/Shunts: The interatrial septum was not well visualized.  LEFT VENTRICLE PLAX 2D LVIDd:         4.80 cm      Diastology LVIDs:         4.00 cm      LV e' medial:    8.05 cm/s LV PW:         0.70 cm      LV E/e' medial:  14.9 LV IVS:        1.00 cm      LV e' lateral:   6.74 cm/s LVOT diam:     2.00 cm      LV E/e' lateral: 17.8 LV SV:         43 LV SV Index:   26 LVOT Area:     3.14 cm  LV Volumes (MOD) LV vol d, MOD A2C: 133.0 ml LV vol d, MOD A4C: 124.0 ml LV vol s, MOD A2C: 66.3 ml LV vol s, MOD A4C: 74.5 ml LV SV MOD A2C:     66.7 ml LV SV MOD A4C:     124.0 ml LV SV MOD BP:      58.0 ml RIGHT VENTRICLE RV Basal diam:  2.90 cm RV S prime:     6.53 cm/s LEFT ATRIUM             Index LA diam:        3.00 cm 1.84 cm/m LA Vol (A2C):   35.0 ml 21.42 ml/m LA Vol (A4C):   34.0 ml 20.81 ml/m LA Biplane Vol: 34.6 ml 21.18 ml/m  AORTIC VALVE LVOT Vmax:   77.40 cm/s LVOT Vmean:  49.300 cm/s LVOT VTI:    0.136 m  AORTA Ao Root diam: 2.70 cm Ao Asc diam:  2.80 cm MITRAL VALVE MV Area (PHT): 6.27 cm     SHUNTS MV Decel Time: 121 msec     Systemic VTI:  0.14 m MV E velocity: 120.00 cm/s  Systemic Diam: 2.00 cm Thurmon Fair MD Electronically signed by Thurmon Fair MD Signature Date/Time: 12/19/2022/11:38:14 AM    Final     Labs:  CBC: Recent Labs    12/18/22 0602 12/19/22 0247 12/20/22 0228 12/21/22 0850  WBC 9.0 11.2* 8.4 7.2  HGB 8.7* 8.7* 9.3* 8.2*  HCT 27.3* 28.0* 28.4* 26.1*  PLT 129* 130* 146* 144*     COAGS: Recent Labs    12/13/22 0146 12/13/22 0845 12/15/22 2135 12/16/22 0249 12/17/22 0428 12/18/22 0602  INR 1.5*  --   --   --   --   --   APTT  --    < > 66* 97* 69* 65*   < > = values in this interval not displayed.     BMP: Recent Labs    12/15/22 1204 12/17/22 0428  12/18/22 1337 12/21/22 0850  NA 139 137 136 137  K 3.6 3.8 4.3 4.8  CL 102 97* 99 99  CO2 25 28 27 28   GLUCOSE 147* 141* 260* 252*  BUN 20 16 29* 31*  CALCIUM 8.6* 8.5* 8.4* 8.7*  CREATININE 2.05* 2.34* 3.11* 3.19*  GFRNONAA 25* 21* 15* 15*     LIVER FUNCTION TESTS: Recent Labs    12/12/22 1917 12/13/22 0146 12/14/22 0501 12/15/22 1204 12/17/22 0428 12/18/22 1337 12/21/22 0850  BILITOT 0.5 0.4 0.3  --   --   --   --   AST 27 25 27   --   --   --   --   ALT 26 24 23   --   --   --   --  ALKPHOS 63 54 47  --   --   --   --   PROT 8.1 7.1 6.2*  --   --   --   --   ALBUMIN 4.2 3.7 3.2* 3.6 3.1* 3.0* 3.0*     Assessment and Plan: Acute cholecystitis s/p drain placement 12/16/22 Pt resting in bed. She is A&O, calm and pleasant.  She is in no distress. Just completed HD and is tired.    Drain in place.  Flushes and aspirates well.   Continue TID flushes with 5 cc NS. Record output Q shift. Dressing changes QD or PRN if soiled.  Call IR APP or on call IR MD if difficulty flushing or sudden change in drain output.    Discharge planning: Please contact IR APP or on call IR MD prior to patient d/c to ensure appropriate follow up plans are in place. Typically patient will follow up with IR clinic in 6-8 weeks post d/c for repeat imaging/possible drain injection. IR scheduler will contact patient with date/time of appointment. Patient will need to flush drain QD with 5 cc NS, record output QD, dressing changes every 2-3 days or earlier if soiled.    IR will continue to follow - please call with questions or concerns.     Electronically Signed: Hoyt Koch, PA 12/21/2022, 3:58 PM   I spent a total of 15 Minutes at the the patient's bedside AND on the patient's hospital floor or unit, greater than 50% of which was counseling/coordinating care for acute cholecystitis.

## 2022-12-21 NOTE — Progress Notes (Signed)
Received patient in bed to unit.  Alert and oriented.  Informed consent signed and in chart.   TX duration: 3.5hrs  Patient tolerated well.  Alert, without acute distress.  Hand-off given to patient's nurse.   Access used: R IJ Access issues: None  Total UF removed: Medication(s) given: None  Post HD weight: 63kg   12/21/22 1206  Vitals  Temp 97.8 F (36.6 C)  Temp Source Oral  BP (!) 120/55  MAP (mmHg) 72  BP Location Left Leg  BP Method Automatic  Patient Position (if appropriate) Lying  Pulse Rate 100  Pulse Rate Source Monitor  ECG Heart Rate (!) 101  Resp 15  Oxygen Therapy  SpO2 100 %  O2 Device Room Air  Patient Activity (if Appropriate) In bed  Pulse Oximetry Type Continuous  During Treatment Monitoring  Intra-Hemodialysis Comments Tx completed;Tolerated well  Post Treatment  Dialyzer Clearance Lightly streaked  Duration of HD Treatment -hour(s) 3.5 hour(s)  Liters Processed 73.5  Fluid Removed (mL) 1200 mL  Tolerated HD Treatment Yes  Hemodialysis Catheter Right Internal jugular  No placement date or time found.   Placed prior to admission: Yes  Orientation: Right  Access Location: Internal jugular  Site Condition No complications  Blue Lumen Status Dead end cap in place;Heparin locked  Red Lumen Status Dead end cap in place;Heparin locked  Purple Lumen Status N/A  Catheter fill solution Heparin 1000 units/ml  Catheter fill volume (Arterial) 2.2 cc  Catheter fill volume (Venous) 2.3  Dressing Type Transparent  Dressing Status Antimicrobial disc in place;Clean, Dry, Intact  Interventions New dressing  Drainage Description None  Dressing Change Due 12/28/22  Post treatment catheter status Capped and Clamped     [facility]

## 2022-12-21 NOTE — Progress Notes (Signed)
PROGRESS NOTE        PATIENT DETAILS Name: Samantha [name] Age: 77 y.o. Sex: female Date of Birth: December 22, 1945 Admit Date: 12/12/2022 Admitting Physician Angie Fava, DO JXB:[id number], No Pcp Per  Brief Summary: Patient is a 77 y.o.  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  Significant events: 4/20>> admit to [facility] 4/23>> HIDA scan positive 4/24>> surgery reevaluation-cholecystostomy drain recommended  Significant studies: 4/20>> CXR: No acute cardiopulmonary disease. 4/20>> CT abdomen/pelvis: Dilated gallbladder-with stones-including a stone which may be impacted in the neck of the gallbladder.  IVC filter in place. 4/20>> RUQ ultrasound: Cholelithiasis with positive Murphy sign. 4/23>> HIDA scan: Nonvisualization of gallbladder consistent with cystic duct obstruction. 4/27>> echo: EF 40-45%, regional wall motion abnormalities.  Significant microbiology data: None  Procedures: 4/24>> cholecystostomy drain placement by IR  Consults: General Surgery IR Vascular surgery.  Subjective: Patient lying comfortably in bed-no major issues overnight.  No RUQ pain.  Objective: Vitals: Blood pressure (!) 146/66, pulse 91, temperature 98.1 F (36.7 C), temperature source Oral, resp. rate 17, height 4\' 11"  (1.499 m), weight 59.8 kg, SpO2 100 %.   Exam: Gen Exam:Alert awake-not in any distress HEENT:atraumatic, normocephalic Chest: B/L clear to auscultation anteriorly CVS:S1S2 regular Abdomen:soft non tender, non distended Extremities:no edema Neurology: Left-sided hemiparesis. Skin: no rash  Pertinent Labs/Radiology:    Latest Ref Rng & Units 12/20/2022    2:28 AM 12/19/2022    2:47 AM 12/18/2022    6:02 AM  CBC  WBC 4.0 - 10.5 K/uL 8.4  11.2  9.0    Hemoglobin 12.0 - 15.0 g/dL 9.3  8.7  8.7   Hematocrit 36.0 - 46.0 % 28.4  28.0  27.3   Platelets 150 - 400 K/uL 146  130  129     Lab Results  Component Value Date   NA 136 12/18/2022   K 4.3 12/18/2022   CL 99 12/18/2022   CO2 27 12/18/2022     Assessment/Plan: Acute calculus cholecystitis Initial imaging studies were equivocal-however HIDA scan in 4/23 wass +ve Reevaluated general surgery on 4/24-and underwent cholecystostomy tube placement Completed Rocephin/Flagyl x 1 week on 4/27 Tolerated regular diet-tolerating resumption of tube feeds.  [facility] recommending outpatient follow-up with Dr. Festus Barren weeks IR following  ESRD on HD MWF Nephrology following. Arrangements being made for outpatient HD by nephrology team Per vascular surgery-no plans to place permanent HD access this admission.  Normocytic anemia Secondary to ESRD Iron/Aranesp defer to nephrology service  DVT Per CT-IVC filter in place Initially on IV heparin-since no plans to place permanent access-has been switched back to Eliquis 4/28.  For prior chart review-DVT had previous TDC site-this past February.  Plan anticoagulation x 3 months.  History of CVA with left residual hemiparesis Dysphagia with PEG tube in place Failure to thrive syndrome Discussed with patient's daughter-[name] on 4/25-at baseline patient is bedbound to wheelchair-bound.  She gets nocturnal feeds via PEG tube.    PT/OT/palliative care/nutrition services all following.  SNF planned.  Chronic HFrEF Euvolemic Volume removal HD Continue with Coreg/losartan  History of CAD Per daughter-patient needs to follow-up with cardiology in the outpatient setting Echo with slightly reduced EF and numerous wall motion abnormalities Currently without  any anginal symptoms Will need to establish with cardiology in the outpatient setting.  HTN BP stable-Coreg/losartan  DM-2 (A1c 7.3 on 4/21) CBGs relatively stable-borderline hypoglycemic  episode yesterday evening-will decrease Semglee back to 16 units-continue SSI. Given her overall frailty-she is not a candidate for tight glycemic control.   Follow/optimize  Recent Labs    12/20/22 2018 12/20/22 2130 12/21/22 0751  GLUCAP 77 88 230*     Nutrition Status: Nutrition Problem: Increased nutrient needs Etiology: acute illness Signs/Symptoms: estimated needs Interventions: MVI, Tube feeding  BMI: Estimated body mass index is 26.63 kg/m as calculated from the following:   Height as of this encounter: 4\' 11"  (1.499 m).   Weight as of this encounter: 59.8 kg.   Code status:   Code Status: Full Code   DVT Prophylaxis: SCDs Start: 12/12/22 2115 apixaban (ELIQUIS) tablet 5 mg   Family Communication: Called daughter-[phone]-updated 4/28   Disposition Plan: Status is: Inpatient Remains inpatient appropriate because: Severity of illness   Planned Discharge Destination:Home versus SNF-TOC team following.   Diet: Diet Order             Diet regular Room service appropriate? Yes with Assist; Fluid consistency: Thin; Fluid restriction: 1200 mL Fluid  Diet effective now                     Antimicrobial agents: Anti-infectives (From admission, onward)    Start     Dose/Rate Route Frequency Ordered Stop   12/16/22 1500  cefOXitin (MEFOXIN) 2 g in sodium chloride 0.9 % 100 mL IVPB        2 g 200 mL/hr over 30 Minutes Intravenous  Once 12/16/22 1421 12/16/22 1536   12/13/22 0800  cefTRIAXone (ROCEPHIN) 2 g in sodium chloride 0.9 % 100 mL IVPB        2 g 200 mL/hr over 30 Minutes Intravenous Every 24 hours 12/13/22 0753 12/19/22 0942   12/13/22 0800  metroNIDAZOLE (FLAGYL) IVPB 500 mg        500 mg 100 mL/hr over 60 Minutes Intravenous Every 12 hours 12/13/22 0753 12/19/22 2221        MEDICATIONS: Scheduled Meds:  apixaban  5 mg Oral BID   carvedilol  6.25 mg Oral BID WC   Chlorhexidine Gluconate Cloth  6 each Topical Q0600    Chlorhexidine Gluconate Cloth  6 each Topical Q0600   darbepoetin (ARANESP) injection - DIALYSIS  60 mcg Subcutaneous Q Wed-1800   feeding supplement (OSMOLITE 1.5 CAL)  480 mL Per Tube Q24H   insulin aspart  0-9 Units Subcutaneous TID WC   insulin glargine-yfgn  18 Units Subcutaneous QHS   losartan  25 mg Oral Daily   multivitamin  1 tablet Oral QHS   sodium chloride flush  5 mL Intracatheter Q8H   Continuous Infusions:  anticoagulant sodium citrate     PRN Meds:.acetaminophen **OR** acetaminophen, alteplase, anticoagulant sodium citrate, fentaNYL (SUBLIMAZE) injection, heparin, heparin, lidocaine (PF), lidocaine-prilocaine, melatonin, naLOXone (NARCAN)  injection, ondansetron (ZOFRAN) IV, pentafluoroprop-tetrafluoroeth   I have personally reviewed following labs and imaging studies  LABORATORY DATA: CBC: Recent Labs  Lab 12/15/22 1204 12/17/22 0428 12/18/22 0602 12/19/22 0247 12/20/22 0228  WBC 9.2 9.6 9.0 11.2* 8.4  HGB 10.3* 8.6* 8.7* 8.7* 9.3*  HCT 30.6* 26.1* 27.3* 28.0* 28.4*  MCV 91.3 93.5 95.5 97.9 95.3  PLT 163 133* 129* 130* 146*     Basic Metabolic Panel: Recent Labs  Lab 12/15/22 0124 12/15/22 1204 12/17/22 0428 12/18/22  1337  NA  --  139 137 136  K  --  3.6 3.8 4.3  CL  --  102 97* 99  CO2  --  25 28 27   GLUCOSE  --  147* 141* 260*  BUN  --  20 16 29*  CREATININE  --  2.05* 2.34* 3.11*  CALCIUM 8.7 8.6* 8.5* 8.4*  MG 1.8  --   --   --   PHOS <1.0* 3.1 2.5 2.7     GFR: Estimated Creatinine Clearance: 12.1 mL/min (A) (by C-G formula based on SCr of 3.11 mg/dL (H)).  Liver Function Tests: Recent Labs  Lab 12/15/22 1204 12/17/22 0428 12/18/22 1337  ALBUMIN 3.6 3.1* 3.0*    No results for input(s): "LIPASE", "AMYLASE" in the last 168 hours. No results for input(s): "AMMONIA" in the last 168 hours.  Coagulation Profile: No results for input(s): "INR", "PROTIME" in the last 168 hours.   Cardiac Enzymes: No results for input(s):  "CKTOTAL", "CKMB", "CKMBINDEX", "TROPONINI" in the last 168 hours.  BNP (last 3 results) No results for input(s): "PROBNP" in the last 8760 hours.  Lipid Profile: No results for input(s): "CHOL", "HDL", "LDLCALC", "TRIG", "CHOLHDL", "LDLDIRECT" in the last 72 hours.  Thyroid Function Tests: No results for input(s): "TSH", "T4TOTAL", "FREET4", "T3FREE", "THYROIDAB" in the last 72 hours.  Anemia Panel: No results for input(s): "VITAMINB12", "FOLATE", "FERRITIN", "TIBC", "IRON", "RETICCTPCT" in the last 72 hours.   Urine analysis:    Component Value Date/Time   COLORURINE YELLOW 12/13/2022 1104   APPEARANCEUR HAZY (A) 12/13/2022 1104   LABSPEC 1.011 12/13/2022 1104   PHURINE 5.0 12/13/2022 1104   GLUCOSEU 150 (A) 12/13/2022 1104   HGBUR MODERATE (A) 12/13/2022 1104   BILIRUBINUR NEGATIVE 12/13/2022 1104   KETONESUR NEGATIVE 12/13/2022 1104   PROTEINUR 100 (A) 12/13/2022 1104   NITRITE NEGATIVE 12/13/2022 1104   LEUKOCYTESUR NEGATIVE 12/13/2022 1104    Sepsis Labs: Lactic Acid, Venous    Component Value Date/Time   LATICACIDVEN 1.0 12/13/2022 0146    MICROBIOLOGY: Recent Results (from the past 240 hour(s))  Aerobic/Anaerobic Culture w Gram Stain (surgical/deep wound)     Status: None (Preliminary result)   Collection Time: 12/16/22  3:19 PM   Specimen: Gallbladder; Abscess  Result Value Ref Range Status   Specimen Description GALL BLADDER  Final   Special Requests NONE  Final   Gram Stain NO WBC SEEN NO ORGANISMS SEEN   Final   Culture   Final    NO GROWTH 4 DAYS NO ANAEROBES ISOLATED; CULTURE IN PROGRESS FOR 5 DAYS Performed at [facility], [address] [place], [place] [address]    Report Status PENDING  Incomplete    RADIOLOGY STUDIES/RESULTS: VAS [place] UPPER EXT VEIN MAPPING (PRE-OP AVF)  Result Date: 12/19/2022 UPPER EXTREMITY VEIN MAPPING Patient Name:  ARMELLA STOGNER  Date of Exam:   12/19/2022 Medical Rec #: [id number]        Accession #:     [id number] Date of Birth: 04/03/1946        Patient Gender: F Patient Age:   51 years Exam Location:  [facility] Procedure:      VAS [place] UPPER EXT VEIN MAPPING (PRE-OP AVF) Referring Phys: Graceann Congress --------------------------------------------------------------------------------  Indications: Pre-access. Limitations: Multiple IVs and bandages in the left arm. Patient would not extend              left arm Comparison Study: No prior study on file Performing Technologist: Candace  Rosezetta Schlatter RVS  Examination Guidelines: A complete evaluation includes B-mode imaging, spectral Doppler, color Doppler, and power Doppler as needed of all accessible portions of each vessel. Bilateral testing is considered an integral part of a complete examination. Limited examinations for reoccurring indications may be performed as noted. +-----------------+-------------+----------+---------+ Right Cephalic   Diameter (cm)Depth (cm)Findings  +-----------------+-------------+----------+---------+ Prox upper arm       0.24        0.83             +-----------------+-------------+----------+---------+ Mid upper arm        0.15        0.92             +-----------------+-------------+----------+---------+ Dist upper arm       0.23        0.44             +-----------------+-------------+----------+---------+ Antecubital fossa    0.26        0.26             +-----------------+-------------+----------+---------+ Prox forearm         0.16        0.50   branching +-----------------+-------------+----------+---------+ Mid forearm          0.16        0.21             +-----------------+-------------+----------+---------+ Dist forearm         0.16        0.16             +-----------------+-------------+----------+---------+ +-----------------+-------------+----------+--------+ Right Basilic    Diameter (cm)Depth (cm)Findings +-----------------+-------------+----------+--------+ Mid upper arm         0.37        0.24    origin  +-----------------+-------------+----------+--------+ Dist upper arm       0.39        2.02            +-----------------+-------------+----------+--------+ Antecubital fossa    0.35        1.83            +-----------------+-------------+----------+--------+ Prox forearm         0.33        0.97            +-----------------+-------------+----------+--------+ Mid forearm          0.19        0.74            +-----------------+-------------+----------+--------+ Distal forearm       0.16        0.68            +-----------------+-------------+----------+--------+ +-----------------+-------------+----------+------------------------------+ Left Cephalic    Diameter (cm)Depth (cm)           Findings            +-----------------+-------------+----------+------------------------------+ Prox upper arm       0.31        1.06                                  +-----------------+-------------+----------+------------------------------+ Mid upper arm        0.32        0.83                                  +-----------------+-------------+----------+------------------------------+ Dist upper arm  not visualized and IV/bandages +-----------------+-------------+----------+------------------------------+ Antecubital fossa                       not visualized and IV/bandages +-----------------+-------------+----------+------------------------------+ Prox forearm                            not visualized and IV/bandages +-----------------+-------------+----------+------------------------------+ Mid forearm                             not visualized and IV/bandages +-----------------+-------------+----------+------------------------------+ Dist forearm                            not visualized and IV/bandages +-----------------+-------------+----------+------------------------------+ Wrist                                             IV/bandages           +-----------------+-------------+----------+------------------------------+ +-----------------+-------------+----------+------------------------------+ Left Basilic     Diameter (cm)Depth (cm)           Findings            +-----------------+-------------+----------+------------------------------+ Prox upper arm                          not visualized and IV/bandages +-----------------+-------------+----------+------------------------------+ Mid upper arm                           not visualized and IV/bandages +-----------------+-------------+----------+------------------------------+ Dist upper arm                          not visualized and IV/bandages +-----------------+-------------+----------+------------------------------+ Antecubital fossa                       not visualized and IV/bandages +-----------------+-------------+----------+------------------------------+ Prox forearm                            not visualized and IV/bandages +-----------------+-------------+----------+------------------------------+ Mid forearm                             not visualized and IV/bandages +-----------------+-------------+----------+------------------------------+ Distal forearm                          not visualized and IV/bandages +-----------------+-------------+----------+------------------------------+ Elbow                                            IV/bandages           +-----------------+-------------+----------+------------------------------+ Wrist                                   not visualized and IV/bandages +-----------------+-------------+----------+------------------------------+ Unable to visualize the basilic and the majority of the cephalic veins secondary to multiple IV's and the patient's refusal to extend arm *See table(s) above for measurements and observations.  Diagnosing physician: Heath Lark  Electronically signed by Heath Lark on 12/19/2022 at 1:34:40 PM.  Final    ECHOCARDIOGRAM COMPLETE  Result Date: 12/19/2022    ECHOCARDIOGRAM REPORT   Patient Name:   JULE WHITSEL Date of Exam: 12/19/2022 Medical Rec #:  [id number]       Height:       59.0 in Accession #:    [id number]      Weight:       150.4 lb Date of Birth:  1945/10/29       BSA:          1.634 m Patient Age:    77 years        BP:           150/57 mmHg Patient Gender: F               HR:           102 bpm. Exam Location:  Inpatient Procedure: 2D Echo, Cardiac Doppler, Color Doppler and Intracardiac            Opacification Agent Indications:    dyspnea  History:        Patient has no prior history of Echocardiogram examinations.                 Stroke; Risk Factors:Diabetes and Hypertension.  Sonographer:    Mike Gip Referring Phys: [id number] Werner Lean Jaaron Oleson IMPRESSIONS  1. Left ventricular ejection fraction, by estimation, is 40 to 45%. The left ventricle has mildly decreased function. The left ventricle demonstrates regional wall motion abnormalities (see scoring diagram/findings for description). Indeterminate diastolic filling due to E-A fusion. There is moderate hypokinesis of the left ventricular, basal-mid lateral wall and inferolateral wall.  2. Right ventricular systolic function is normal. The right ventricular size is normal. Tricuspid regurgitation signal is inadequate for assessing PA pressure.  3. The mitral valve is normal in structure. Mild mitral valve regurgitation.  4. The aortic valve is tricuspid. Aortic valve regurgitation is not visualized. No aortic stenosis is present. FINDINGS  Left Ventricle: Left ventricular ejection fraction, by estimation, is 40 to 45%. The left ventricle has mildly decreased function. The left ventricle demonstrates regional wall motion abnormalities. Moderate hypokinesis of the left ventricular, basal-mid lateral wall and inferolateral wall. Definity contrast agent was given IV to  delineate the left ventricular endocardial borders. The left ventricular internal cavity size was normal in size. There is no left ventricular hypertrophy. Indeterminate diastolic filling due to E-A fusion.  LV Wall Scoring: The antero-lateral wall and posterior wall are hypokinetic. Right Ventricle: The right ventricular size is normal. No increase in right ventricular wall thickness. Right ventricular systolic function is normal. Tricuspid regurgitation signal is inadequate for assessing PA pressure. Left Atrium: Left atrial size was normal in size. Right Atrium: Right atrial size was normal in size. Pericardium: There is no evidence of pericardial effusion. Mitral Valve: The mitral valve is normal in structure. Mild mitral valve regurgitation, with centrally-directed jet. Tricuspid Valve: The tricuspid valve is normal in structure. Tricuspid valve regurgitation is not demonstrated. Aortic Valve: The aortic valve is tricuspid. Aortic valve regurgitation is not visualized. No aortic stenosis is present. Pulmonic Valve: The pulmonic valve was normal in structure. Pulmonic valve regurgitation is not visualized. Aorta: The aortic root and ascending aorta are structurally normal, with no evidence of dilitation. IAS/Shunts: The interatrial septum was not well visualized.  LEFT VENTRICLE PLAX 2D LVIDd:         4.80 cm      Diastology LVIDs:  4.00 cm      LV e' medial:    8.05 cm/s LV PW:         0.70 cm      LV E/e' medial:  14.9 LV IVS:        1.00 cm      LV e' lateral:   6.74 cm/s LVOT diam:     2.00 cm      LV E/e' lateral: 17.8 LV SV:         43 LV SV Index:   26 LVOT Area:     3.14 cm  LV Volumes (MOD) LV vol d, MOD A2C: 133.0 ml LV vol d, MOD A4C: 124.0 ml LV vol s, MOD A2C: 66.3 ml LV vol s, MOD A4C: 74.5 ml LV SV MOD A2C:     66.7 ml LV SV MOD A4C:     124.0 ml LV SV MOD BP:      58.0 ml RIGHT VENTRICLE RV Basal diam:  2.90 cm RV S prime:     6.53 cm/s LEFT ATRIUM             Index LA diam:        3.00  cm 1.84 cm/m LA Vol (A2C):   35.0 ml 21.42 ml/m LA Vol (A4C):   34.0 ml 20.81 ml/m LA Biplane Vol: 34.6 ml 21.18 ml/m  AORTIC VALVE LVOT Vmax:   77.40 cm/s LVOT Vmean:  49.300 cm/s LVOT VTI:    0.136 m  AORTA Ao Root diam: 2.70 cm Ao Asc diam:  2.80 cm MITRAL VALVE MV Area (PHT): 6.27 cm     SHUNTS MV Decel Time: 121 msec     Systemic VTI:  0.14 m MV E velocity: 120.00 cm/s  Systemic Diam: 2.00 cm Mihai Croitoru MD Electronically signed by Thurmon Fair MD Signature Date/Time: 12/19/2022/11:38:14 AM    Final      LOS: 9 days   Jeoffrey Massed, MD  [facility]    To contact the attending provider between 7A-7P or the covering provider during after hours 7P-7A, please log into the web site [email or web address] and access using universal [employer] password for that web site. If you do not have the password, please call the hospital operator.  12/21/2022, 8:06 AM

## 2022-12-21 NOTE — Plan of Care (Signed)
Problem: Education: Goal: Ability to describe self-care measures that may prevent or decrease complications (Diabetes Survival Skills Education) will improve 12/21/2022 0852 by Rosalio Macadamia, RN Outcome: Progressing 12/20/2022 1956 by Rosalio Macadamia, RN Outcome: Progressing Goal: Individualized Educational Video(s) 12/21/2022 0852 by Rosalio Macadamia, RN Outcome: Progressing 12/20/2022 1956 by Rosalio Macadamia, RN Outcome: Progressing   Problem: Coping: Goal: Ability to adjust to condition or change in health will improve 12/21/2022 0852 by Rosalio Macadamia, RN Outcome: Progressing 12/20/2022 1956 by Rosalio Macadamia, RN Outcome: Progressing   Problem: Fluid Volume: Goal: Ability to maintain a balanced intake and output will improve 12/21/2022 0852 by Rosalio Macadamia, RN Outcome: Progressing 12/20/2022 1956 by Rosalio Macadamia, RN Outcome: Progressing   Problem: Health Behavior/Discharge Planning: Goal: Ability to identify and utilize available resources and services will improve 12/21/2022 0852 by Rosalio Macadamia, RN Outcome: Progressing 12/20/2022 1956 by Rosalio Macadamia, RN Outcome: Progressing Goal: Ability to manage health-related needs will improve 12/21/2022 0852 by Rosalio Macadamia, RN Outcome: Progressing 12/20/2022 1956 by Rosalio Macadamia, RN Outcome: Progressing   Problem: Metabolic: Goal: Ability to maintain appropriate glucose levels will improve 12/21/2022 0852 by Rosalio Macadamia, RN Outcome: Progressing 12/20/2022 1956 by Rosalio Macadamia, RN Outcome: Progressing   Problem: Nutritional: Goal: Maintenance of adequate nutrition will improve 12/21/2022 0852 by Rosalio Macadamia, RN Outcome: Progressing 12/20/2022 1956 by Rosalio Macadamia, RN Outcome: Progressing Goal: Progress toward achieving an optimal weight will improve 12/21/2022 0852 by Rosalio Macadamia, RN Outcome: Progressing 12/20/2022 1956 by Rosalio Macadamia, RN Outcome: Progressing   Problem: Skin  Integrity: Goal: Risk for impaired skin integrity will decrease 12/21/2022 0852 by Rosalio Macadamia, RN Outcome: Progressing 12/20/2022 1956 by Rosalio Macadamia, RN Outcome: Progressing   Problem: Tissue Perfusion: Goal: Adequacy of tissue perfusion will improve 12/21/2022 0852 by Rosalio Macadamia, RN Outcome: Progressing 12/20/2022 1956 by Rosalio Macadamia, RN Outcome: Progressing   Problem: Education: Goal: Knowledge of General Education information will improve Description: Including pain rating scale, medication(s)/side effects and non-pharmacologic comfort measures 12/21/2022 0852 by Rosalio Macadamia, RN Outcome: Progressing 12/20/2022 1956 by Rosalio Macadamia, RN Outcome: Progressing   Problem: Health Behavior/Discharge Planning: Goal: Ability to manage health-related needs will improve 12/21/2022 0852 by Rosalio Macadamia, RN Outcome: Progressing 12/20/2022 1956 by Rosalio Macadamia, RN Outcome: Progressing   Problem: Clinical Measurements: Goal: Ability to maintain clinical measurements within normal limits will improve 12/21/2022 0852 by Rosalio Macadamia, RN Outcome: Progressing 12/20/2022 1956 by Rosalio Macadamia, RN Outcome: Progressing Goal: Will remain free from infection 12/21/2022 0852 by Rosalio Macadamia, RN Outcome: Progressing 12/20/2022 1956 by Rosalio Macadamia, RN Outcome: Progressing Goal: Diagnostic test results will improve 12/21/2022 0852 by Rosalio Macadamia, RN Outcome: Progressing 12/20/2022 1956 by Rosalio Macadamia, RN Outcome: Progressing Goal: Respiratory complications will improve 12/21/2022 0852 by Rosalio Macadamia, RN Outcome: Progressing 12/20/2022 1956 by Rosalio Macadamia, RN Outcome: Progressing Goal: Cardiovascular complication will be avoided 12/21/2022 4098 by Rosalio Macadamia, RN Outcome: Progressing 12/20/2022 1956 by Rosalio Macadamia, RN Outcome: Progressing   Problem: Activity: Goal: Risk for activity intolerance will decrease 12/21/2022 0852 by Rosalio Macadamia, RN Outcome: Progressing 12/20/2022 1956 by Rosalio Macadamia, RN Outcome: Progressing   Problem: Nutrition: Goal: Adequate nutrition will be maintained 12/21/2022 0852 by Rosalio Macadamia, RN Outcome: Progressing 12/20/2022 1956 by Rosalio Macadamia, RN Outcome: Progressing  Problem: Coping: Goal: Level of anxiety will decrease 12/21/2022 0852 by Rosalio Macadamia, RN Outcome: Progressing 12/20/2022 1956 by Rosalio Macadamia, RN Outcome: Progressing   Problem: Elimination: Goal: Will not experience complications related to bowel motility 12/21/2022 0852 by Rosalio Macadamia, RN Outcome: Progressing 12/20/2022 1956 by Rosalio Macadamia, RN Outcome: Progressing Goal: Will not experience complications related to urinary retention 12/21/2022 0852 by Rosalio Macadamia, RN Outcome: Progressing 12/20/2022 1956 by Rosalio Macadamia, RN Outcome: Progressing   Problem: Pain Managment: Goal: General experience of comfort will improve 12/21/2022 0852 by Rosalio Macadamia, RN Outcome: Progressing 12/20/2022 1956 by Rosalio Macadamia, RN Outcome: Progressing   Problem: Safety: Goal: Ability to remain free from injury will improve 12/21/2022 0852 by Rosalio Macadamia, RN Outcome: Progressing 12/20/2022 1956 by Rosalio Macadamia, RN Outcome: Progressing   Problem: Skin Integrity: Goal: Risk for impaired skin integrity will decrease 12/21/2022 0852 by Rosalio Macadamia, RN Outcome: Progressing 12/20/2022 1956 by Rosalio Macadamia, RN Outcome: Progressing

## 2022-12-21 NOTE — TOC Progression Note (Signed)
Transition of Care [facility]) - Progression Note    Patient Details  Name: Samantha [name] MRN: [id number] Date of Birth: 1945/11/18  Transition of Care [facility]) CM/SW Contact  Mearl Latin, LCSW Phone Number: 12/21/2022, 4:17 PM  Clinical Narrative:    CSW continuing to follow. Hopeful for acceptance at HD in [facility] for SNF placement.    Expected Discharge Plan: Skilled Nursing Facility Barriers to Discharge: Continued Medical Work up, SNF Pending bed offer, English as a second language teacher (Does not have [place] Medicaid yet and likely needs long term)  Expected Discharge Plan and Services In-house Referral: Clinical Social Work   Post Acute Care Choice: Skilled Nursing Facility Living arrangements for the past 2 months: Skilled Nursing Facility                                       Social Determinants of Health (SDOH) Interventions SDOH Screenings   Food Insecurity: No Food Insecurity (12/12/2022)  Housing: Low Risk  (12/12/2022)  Transportation Needs: No Transportation Needs (12/12/2022)  Utilities: Not At Risk (12/12/2022)  Tobacco Use: Unknown (12/16/2022)    Readmission Risk Interventions     No data to display

## 2022-12-22 DIAGNOSIS — I10 Essential (primary) hypertension: Secondary | ICD-10-CM | POA: Diagnosis not present

## 2022-12-22 DIAGNOSIS — E119 Type 2 diabetes mellitus without complications: Secondary | ICD-10-CM | POA: Diagnosis not present

## 2022-12-22 DIAGNOSIS — K810 Acute cholecystitis: Secondary | ICD-10-CM | POA: Diagnosis not present

## 2022-12-22 DIAGNOSIS — R1013 Epigastric pain: Secondary | ICD-10-CM | POA: Diagnosis not present

## 2022-12-22 LAB — GLUCOSE, CAPILLARY
Glucose-Capillary: 170 mg/dL — ABNORMAL HIGH (ref 70–99)
Glucose-Capillary: 140 mg/dL — ABNORMAL HIGH (ref 70–99)
Glucose-Capillary: 113 mg/dL — ABNORMAL HIGH (ref 70–99)
Glucose-Capillary: 114 mg/dL — ABNORMAL HIGH (ref 70–99)

## 2022-12-22 MED ORDER — CHLORHEXIDINE GLUCONATE CLOTH 2 % EX PADS
6.0000 | MEDICATED_PAD | Freq: Every day | CUTANEOUS | Status: DC
  Administered 2022-12-23 – 2022-12-24 (×2): 6 via TOPICAL

## 2022-12-22 NOTE — Plan of Care (Signed)

## 2022-12-22 NOTE — Progress Notes (Signed)
Physical Therapy Treatment Patient Details Name: Samantha [name] MRN: [id number] DOB: [birth year] Today's Date: 12/22/2022   History of Present Illness 77 y.o. female admitted on 4/20 with abdominal pain and concern for acute cholecystitis.   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    PT Comments    Pt greeted up in chair post OT session demonstrating progress towards goals and increased activity tolerance with back to back sessions. Pt able to come to partial stand with max A x3 trials in steady frame and maintain for a few seconds each trial. Pt with fair tolerance for LE therex with cues for technique and assist on L as pt with residual L weakness. Pt agreeable to time up in chair at end of session and verbalizing understanding of importance of continued mobility and time up OOB. Pt continues to benefit from skilled PT services to progress toward functional mobility goals.    Recommendations for follow up therapy are one component of a multi-disciplinary discharge planning process, led by the attending physician.  Recommendations may be updated based on patient status, additional functional criteria and insurance authorization.  Follow Up Recommendations  Can patient physically be transported by private vehicle: No    Assistance Recommended at Discharge Frequent or constant Supervision/Assistance  Patient can return home with the following Two people to help with walking and/or transfers;Assist for transportation;A lot of help with bathing/dressing/bathroom   Equipment Recommendations  Other (comment) (defer to next level)    Recommendations for Other Services       Precautions / Restrictions Precautions Precautions: Fall Precaution Comments: PEG, R abdominal drain Restrictions Weight Bearing Restrictions: No     Mobility  Bed Mobility Overal bed  mobility: Needs Assistance             General bed mobility comments: pt up in chair on arrival    Transfers Overall transfer level: Needs assistance Equipment used: Ambulation equipment used Transfers: Sit to/from Stand Sit to Stand: Max assist           General transfer comment: max A to stand x3 attempts from EOB, pt limited by LUE pain, max A to maintain standing balance ~3 seconds each trial Transfer via Lift Equipment: Stedy  Ambulation/Gait                   Stairs             Wheelchair Mobility    Modified Rankin (Stroke Patients Only)       Balance Overall balance assessment: Needs assistance Sitting-balance support: Feet supported, Single extremity supported, Bilateral upper extremity supported Sitting balance-Leahy Scale: Poor Sitting balance - Comments: reliant on 1-2 UE support to maintain sitting balance,able to demo effort to correct when cued but unable to sustain Postural control: Right lateral lean Standing balance support: Bilateral upper extremity supported, During functional activity Standing balance-Leahy Scale: Poor                              Cognition Arousal/Alertness: Awake/alert Behavior During Therapy: WFL for tasks assessed/performed, Flat affect Overall Cognitive Status: No family/caregiver present to determine baseline cognitive functioning Area of Impairment: Attention, Memory, Following commands, Safety/judgement, Awareness, Problem solving                   Current Attention Level: Sustained Memory: Decreased  short-term memory Following Commands: Follows one step commands consistently, Follows one step commands with increased time Safety/Judgement: Decreased awareness of safety, Decreased awareness of deficits Awareness: Intellectual Problem Solving: Slow processing, Decreased initiation, Difficulty sequencing, Requires verbal cues, Requires tactile cues General Comments: pt with increased  time for command following, pleasant throughout session but cueing needed for sequencing.        Exercises General Exercises - Upper Extremity Shoulder Flexion: AROM, Right, Left, 10 reps, Seated Shoulder Extension: AROM, Right, Left, 10 reps, Seated General Exercises - Lower Extremity Ankle Circles/Pumps: AROM, Right, Left, 5 reps Long Arc Quad: AROM, AAROM, Right, Left, 10 reps, Seated Heel Slides: AROM, AAROM, Right, Left, 10 reps Hip ABduction/ADduction: AROM, AAROM, Right, Left, 10 reps Hip Flexion/Marching: AROM, AAROM, Left, Right, 10 reps    General Comments General comments (skin integrity, edema, etc.): VSS on RA      Pertinent Vitals/Pain Pain Assessment Pain Assessment: Faces Faces Pain Scale: Hurts a little bit Pain Location: L UE Pain Descriptors / Indicators: Grimacing, Guarding Pain Intervention(s): Monitored during session, Limited activity within patient's tolerance    Home Living                          Prior Function            PT Goals (current goals can now be found in the care plan section) Acute Rehab PT Goals PT Goal Formulation: With patient Time For Goal Achievement: 12/29/22 Progress towards PT goals: Progressing toward goals    Frequency    Min 2X/week      PT Plan Current plan remains appropriate    Co-evaluation              AM-PAC PT "6 Clicks" Mobility   Outcome Measure  Help needed turning from your back to your side while in a flat bed without using bedrails?: A Lot Help needed moving from lying on your back to sitting on the side of a flat bed without using bedrails?: A Lot Help needed moving to and from a bed to a chair (including a wheelchair)?: Total Help needed standing up from a chair using your arms (e.g., wheelchair or bedside chair)?: Total Help needed to walk in hospital room?: Total Help needed climbing 3-5 steps with a railing? : Total 6 Click Score: 8    End of Session   Activity  Tolerance: Patient limited by fatigue;Patient limited by pain Patient left: in chair;with call bell/phone within reach;with nursing/sitter in room Nurse Communication: Mobility status;Need for lift equipment PT Visit Diagnosis: Other abnormalities of gait and mobility (R26.89);Muscle weakness (generalized) (M62.81)     Time: 1610-9604 PT Time Calculation (min) (ACUTE ONLY): 19 min  Charges:  $Therapeutic Activity: 8-22 mins                     Khamora Karan R. PTA Acute Rehabilitation Services Office: [phone]    Catalina Antigua 12/22/2022, 11:19 AM

## 2022-12-22 NOTE — Progress Notes (Addendum)
PROGRESS NOTE        PATIENT DETAILS Name: Samantha [name] Age: 77 y.o. Sex: female Date of Birth: 26-Nov-1945 Admit Date: 12/12/2022 Admitting Physician Angie Fava, DO JXB:[id number], No Pcp Per  Brief Summary: Patient is a 77 y.o.  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  Significant events: 4/20>> admit to [facility] 4/23>> HIDA scan positive 4/24>> surgery reevaluation-cholecystostomy drain recommended  Significant studies: 4/20>> CXR: No acute cardiopulmonary disease. 4/20>> CT abdomen/pelvis: Dilated gallbladder-with stones-including a stone which may be impacted in the neck of the gallbladder.  IVC filter in place. 4/20>> RUQ ultrasound: Cholelithiasis with positive Murphy sign. 4/23>> HIDA scan: Nonvisualization of gallbladder consistent with cystic duct obstruction. 4/27>> echo: EF 40-45%, regional wall motion abnormalities.  Significant microbiology data: None  Procedures: 4/24>> cholecystostomy drain placement by IR  Consults: General Surgery IR Vascular surgery.  Subjective: No RUQ pain-no nausea or vomiting  Objective: Vitals: Blood pressure (!) 143/65, pulse (!) 105, temperature 98.5 F (36.9 C), temperature source Oral, resp. rate 16, height 4\' 11"  (1.499 m), weight 62.6 kg, SpO2 100 %.   Exam: Gen Exam:Alert awake-not in any distress HEENT:atraumatic, normocephalic Chest: B/L clear to auscultation anteriorly CVS:S1S2 regular Abdomen:soft non tender, non distended Extremities:no edema Neurology: Left-sided hemiparesis. Skin: no rash  Pertinent Labs/Radiology:    Latest Ref Rng & Units 12/21/2022    8:50 AM 12/20/2022    2:28 AM 12/19/2022    2:47 AM  CBC  WBC 4.0 - 10.5 K/uL 7.2  8.4  11.2   Hemoglobin 12.0 - 15.0 g/dL 8.2  9.3  8.7    Hematocrit 36.0 - 46.0 % 26.1  28.4  28.0   Platelets 150 - 400 K/uL 144  146  130     Lab Results  Component Value Date   NA 137 12/21/2022   K 4.8 12/21/2022   CL 99 12/21/2022   CO2 28 12/21/2022     Assessment/Plan: Acute calculus cholecystitis Initial imaging studies were equivocal-however HIDA scan in 4/23 wass +ve Reevaluated general surgery on 4/24-and underwent cholecystostomy tube placement Completed Rocephin/Flagyl x 1 week on 4/27 Tolerated regular diet-tolerating resumption of tube feeds.  CCS recommending outpatient follow-up with Dr. Festus Barren weeks IR following  ESRD on HD MWF Nephrology following. Arrangements being made for outpatient HD by nephrology team Per vascular surgery-no plans to place permanent HD access this admission.  Normocytic anemia Secondary to ESRD Iron/Aranesp defer to nephrology service  DVT Per CT-IVC filter in place Initially on IV heparin-since no plans to place permanent access-has been switched back to Eliquis 4/28.  For prior chart review-DVT had previous TDC site-this past February.  Plan anticoagulation x 3 months.  History of CVA with left residual hemiparesis Dysphagia with PEG tube in place Failure to thrive syndrome Discussed with patient's daughter-[name] on 4/25-at baseline patient is bedbound to wheelchair-bound.  She gets nocturnal feeds via PEG tube.    PT/OT/palliative care/nutrition services all following.  SNF planned.  Chronic HFrEF Euvolemic Volume removal HD Continue with Coreg/losartan  History of CAD Per daughter-patient needs to follow-up with cardiology in the outpatient setting Echo with slightly reduced EF and numerous wall motion abnormalities Currently without any anginal symptoms Will need  to establish with cardiology in the outpatient setting.  HTN BP stable-Coreg/losartan  DM-2 (A1c 7.3 on 4/21) CBGs relatively stable-borderline hypoglycemic episode yesterday evening-will decrease  Semglee back to 16 units-continue SSI. Given her overall frailty-she is not a candidate for tight glycemic control.   Follow/optimize  Recent Labs    12/21/22 1601 12/21/22 2227 12/22/22 0818  GLUCAP 184* 169* 170*     Nutrition Status: Nutrition Problem: Increased nutrient needs Etiology: acute illness Signs/Symptoms: estimated needs Interventions: MVI, Tube feeding  BMI: Estimated body mass index is 27.87 kg/m as calculated from the following:   Height as of this encounter: 4\' 11"  (1.499 m).   Weight as of this encounter: 62.6 kg.   Code status:   Code Status: Full Code   DVT Prophylaxis: SCDs Start: 12/12/22 2115 apixaban (ELIQUIS) tablet 5 mg    Family Communication: Called daughter-Joclyn-[phone]-updated 4/30   Disposition Plan: Status is: Inpatient Remains inpatient appropriate because: Severity of illness   Planned Discharge Destination:Home versus SNF-TOC team following.   Diet: Diet Order             Diet regular Room service appropriate? Yes with Assist; Fluid consistency: Thin; Fluid restriction: 1200 mL Fluid  Diet effective now                     Antimicrobial agents: Anti-infectives (From admission, onward)    Start     Dose/Rate Route Frequency Ordered Stop   12/16/22 1500  cefOXitin (MEFOXIN) 2 g in sodium chloride 0.9 % 100 mL IVPB        2 g 200 mL/hr over 30 Minutes Intravenous  Once 12/16/22 1421 12/16/22 1536   12/13/22 0800  cefTRIAXone (ROCEPHIN) 2 g in sodium chloride 0.9 % 100 mL IVPB        2 g 200 mL/hr over 30 Minutes Intravenous Every 24 hours 12/13/22 0753 12/19/22 0942   12/13/22 0800  metroNIDAZOLE (FLAGYL) IVPB 500 mg        500 mg 100 mL/hr over 60 Minutes Intravenous Every 12 hours 12/13/22 0753 12/19/22 2221        MEDICATIONS: Scheduled Meds:  apixaban  5 mg Oral BID   carvedilol  6.25 mg Oral BID WC   Chlorhexidine Gluconate Cloth  6 each Topical Q0600   Chlorhexidine Gluconate Cloth  6 each  Topical Q0600   darbepoetin (ARANESP) injection - DIALYSIS  60 mcg Subcutaneous Q Wed-1800   feeding supplement (OSMOLITE 1.5 CAL)  480 mL Per Tube Q24H   insulin aspart  0-9 Units Subcutaneous TID WC   insulin glargine-yfgn  16 Units Subcutaneous QHS   losartan  25 mg Oral Daily   multivitamin  1 tablet Oral QHS   sodium chloride flush  5 mL Intracatheter Q8H   Continuous Infusions:   PRN Meds:.acetaminophen **OR** acetaminophen, fentaNYL (SUBLIMAZE) injection, melatonin, naLOXone (NARCAN)  injection, ondansetron (ZOFRAN) IV   I have personally reviewed following labs and imaging studies  LABORATORY DATA: CBC: Recent Labs  Lab 12/17/22 0428 12/18/22 0602 12/19/22 0247 12/20/22 0228 12/21/22 0850  WBC 9.6 9.0 11.2* 8.4 7.2  HGB 8.6* 8.7* 8.7* 9.3* 8.2*  HCT 26.1* 27.3* 28.0* 28.4* 26.1*  MCV 93.5 95.5 97.9 95.3 99.2  PLT 133* 129* 130* 146* 144*     Basic Metabolic Panel: Recent Labs  Lab 12/17/22 0428 12/18/22 1337 12/21/22 0850  NA 137 136 137  K 3.8 4.3 4.8  CL 97* 99 99  CO2 28 27 28  GLUCOSE 141* 260* 252*  BUN 16 29* 31*  CREATININE 2.34* 3.11* 3.19*  CALCIUM 8.5* 8.4* 8.7*  PHOS 2.5 2.7 3.7     GFR: Estimated Creatinine Clearance: 12.1 mL/min (A) (by C-G formula based on SCr of 3.19 mg/dL (H)).  Liver Function Tests: Recent Labs  Lab 12/17/22 0428 12/18/22 1337 12/21/22 0850  ALBUMIN 3.1* 3.0* 3.0*    No results for input(s): "LIPASE", "AMYLASE" in the last 168 hours. No results for input(s): "AMMONIA" in the last 168 hours.  Coagulation Profile: No results for input(s): "INR", "PROTIME" in the last 168 hours.   Cardiac Enzymes: No results for input(s): "CKTOTAL", "CKMB", "CKMBINDEX", "TROPONINI" in the last 168 hours.  BNP (last 3 results) No results for input(s): "PROBNP" in the last 8760 hours.  Lipid Profile: No results for input(s): "CHOL", "HDL", "LDLCALC", "TRIG", "CHOLHDL", "LDLDIRECT" in the last 72 hours.  Thyroid  Function Tests: No results for input(s): "TSH", "T4TOTAL", "FREET4", "T3FREE", "THYROIDAB" in the last 72 hours.  Anemia Panel: No results for input(s): "VITAMINB12", "FOLATE", "FERRITIN", "TIBC", "IRON", "RETICCTPCT" in the last 72 hours.   Urine analysis:    Component Value Date/Time   COLORURINE YELLOW 12/13/2022 1104   APPEARANCEUR HAZY (A) 12/13/2022 1104   LABSPEC 1.011 12/13/2022 1104   PHURINE 5.0 12/13/2022 1104   GLUCOSEU 150 (A) 12/13/2022 1104   HGBUR MODERATE (A) 12/13/2022 1104   BILIRUBINUR NEGATIVE 12/13/2022 1104   KETONESUR NEGATIVE 12/13/2022 1104   PROTEINUR 100 (A) 12/13/2022 1104   NITRITE NEGATIVE 12/13/2022 1104   LEUKOCYTESUR NEGATIVE 12/13/2022 1104    Sepsis Labs: Lactic Acid, Venous    Component Value Date/Time   LATICACIDVEN 1.0 12/13/2022 0146    MICROBIOLOGY: Recent Results (from the past 240 hour(s))  Aerobic/Anaerobic Culture w Gram Stain (surgical/deep wound)     Status: None   Collection Time: 12/16/22  3:19 PM   Specimen: Gallbladder; Abscess  Result Value Ref Range Status   Specimen Description GALL BLADDER  Final   Special Requests NONE  Final   Gram Stain NO WBC SEEN NO ORGANISMS SEEN   Final   Culture   Final    No growth aerobically or anaerobically. Performed at [facility], [address] [place], [place] [address]    Report Status 12/21/2022 FINAL  Final    RADIOLOGY STUDIES/RESULTS: No results found.   LOS: 10 days   Jeoffrey Massed, MD  [facility]    To contact the attending provider between 7A-7P or the covering provider during after hours 7P-7A, please log into the web site [email or web address] and access using universal [facility] [place] password for that web site. If you do not have the password, please call the hospital operator.  12/22/2022, 12:07 PM

## 2022-12-22 NOTE — Progress Notes (Signed)
[facility] Progress Note   Subjective:   Feeling well. Denies SOB, CP, dizziness, abdominal pain and nausea.   Objective Vitals:   12/22/22 0033 12/22/22 0500 12/22/22 0522 12/22/22 0820  BP: 134/71  139/63 (!) 143/65  Pulse: (!) 114  (!) 105   Resp: 20  20 16   Temp: 98.6 F (37 C)  98.6 F (37 C) 98.5 F (36.9 C)  TempSrc: Oral  Oral Oral  SpO2:      Weight:  62.6 kg    Height:       Physical Exam General: Alert, pleasant female. NAD Heart: RRR, no murmurs, rubs or gallops Lungs: CTA bilaterally, on RA Abdomen: Soft, non-distended, +BS Extremities: No edema b/l lower extremities Dialysis Access:  [facility]     Additional Objective Labs: Basic Metabolic Panel: Recent Labs  Lab 12/17/22 0428 12/18/22 1337 12/21/22 0850  NA 137 136 137  K 3.8 4.3 4.8  CL 97* 99 99  CO2 28 27 28   GLUCOSE 141* 260* 252*  BUN 16 29* 31*  CREATININE 2.34* 3.11* 3.19*  CALCIUM 8.5* 8.4* 8.7*  PHOS 2.5 2.7 3.7   Liver Function Tests: Recent Labs  Lab 12/17/22 0428 12/18/22 1337 12/21/22 0850  ALBUMIN 3.1* 3.0* 3.0*    CBC: Recent Labs  Lab 12/17/22 0428 12/18/22 0602 12/19/22 0247 12/20/22 0228 12/21/22 0850  WBC 9.6 9.0 11.2* 8.4 7.2  HGB 8.6* 8.7* 8.7* 9.3* 8.2*  HCT 26.1* 27.3* 28.0* 28.4* 26.1*  MCV 93.5 95.5 97.9 95.3 99.2  PLT 133* 129* 130* 146* 144*   Blood Culture    Component Value Date/Time   SDES GALL BLADDER 12/16/2022 1519   SPECREQUEST NONE 12/16/2022 1519   CULT  12/16/2022 1519    No growth aerobically or anaerobically. Performed at [facility], [address] [place], [place] [address]    REPTSTATUS 12/21/2022 FINAL 12/16/2022 1519    CBG: Recent Labs  Lab 12/21/22 0751 12/21/22 1253 12/21/22 1601 12/21/22 2227 12/22/22 0818  GLUCAP 230* 145* 184* 169* 170*    Medications:   apixaban  5 mg Oral BID   carvedilol  6.25 mg Oral BID WC   Chlorhexidine Gluconate Cloth  6 each Topical Q0600   Chlorhexidine  Gluconate Cloth  6 each Topical Q0600   darbepoetin (ARANESP) injection - DIALYSIS  60 mcg Subcutaneous Q Wed-1800   feeding supplement (OSMOLITE 1.5 CAL)  480 mL Per Tube Q24H   insulin aspart  0-9 Units Subcutaneous TID WC   insulin glargine-yfgn  16 Units Subcutaneous QHS   losartan  25 mg Oral Daily   multivitamin  1 tablet Oral QHS   sodium chloride flush  5 mL Intracatheter Q8H    Dialysis Orders: OP HD = orders that were entered for [facility]=3 hours 15 minutes- BFR 350 TDC- heparin 1500 units then 750 mid run   Assessment/Plan: Abdominal pain workup for cholestasis- surgery involved, status post cholecystostomy tube placement by IR. Completed ABX. Has PEG.  ESRD -HD in hospital MWF (moved from [place]- has not officially started HD at [facility]). Likely needs to be 3h6min on d/c.  Agreed to permanent access placement - VVS consulted, considered marginal surgical candidate, may be better suited with perm cath per notes.  Appreciate their assistance.  Renal navigator working on outpatient HD placement near SNF. HTN/volume - BP variable, improved post HD. On coreg 6.25mg  BID only- apparently on 4 meds including HCTZ at home - does not appear to require these  medications now that close to dry. Does not appear grossly overloaded.  Dry weight likely around 63kg per weights here. UF as tolerated.  Anemia - Hgb 8.2. tsat 90%/Ferritin 3281 - no IV iron. Continue Aranesp qwk, started 4/22.  Secondary hyperparathyroidism -low phosphorus initially, corrected with supplementation. Corrected calcium at goal, PTH 321 .  No vitamin D currently Nutrition-ALB 3.0, on Osmolite supplement /renal diet  Deconditioning/history of CVA- for nursing home placement. She is in bed today, will need to make sure she can tolerate HD in recliner before she discharges.Palliative care involved.   Rogers Blocker, PA-C 12/22/2022, 9:27 AM  [facility] Pager: [phone]

## 2022-12-22 NOTE — Progress Notes (Addendum)
Received a call from Kim with [facility] out-pt HD. There is some financial information that needs to be discussed with pt's daughter if pt is to receive out-pt HD with [facility]. Pt's insurance is out-of-network with [facility] and staff need to discuss how that affects the pt. Message left for pt's daughter, Kennith Maes, requesting a return call so daughter can be provided the contact information for financial counselor with out-pt HD clinic. Will await a return call.   Olivia Canter Renal Navigator [phone]  Addendum at 12:47 pm: Spoke to pt's daughter via phone. Pt is supposed to have a new Brewing technologist with [facility] PPO starting tomorrow. Message left for Kim with [facility].

## 2022-12-22 NOTE — Progress Notes (Signed)
Occupational Therapy Treatment Patient Details Name: Samantha [name] MRN: [id number] DOB: 01-May-1946 Today's Date: 12/22/2022   History of present illness 77 y.o. female admitted on 4/20 with abdominal pain and concern for acute cholecystitis.   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   OT comments  Pt making gradual progress towards goals. Focused on EOB balance and postural control, as well as first progression OOB with use of Stedy at Mod A x 2. Lift pad placed under pt for maximove transfer back to bed. Cognitive deficits remain w/ cues needed for problem solving, call bell use and sequencing needed during session. Pt also continues to require Total A for LB ADLs bed level due to significant deconditioning. Continue to rec postacute rehab.   Recommendations for follow up therapy are one component of a multi-disciplinary discharge planning process, led by the attending physician.  Recommendations may be updated based on patient status, additional functional criteria and insurance authorization.    Assistance Recommended at Discharge Frequent or constant Supervision/Assistance  Patient can return home with the following  Two people to help with walking and/or transfers;Two people to help with bathing/dressing/bathroom;Assistance with cooking/housework;Assistance with feeding;Direct supervision/assist for medications management;Direct supervision/assist for financial management;Assist for transportation;Help with stairs or ramp for entrance   Equipment Recommendations  Hospital bed;BSC/3in1    Recommendations for Other Services      Precautions / Restrictions Precautions Precautions: Fall Precaution Comments: PEG, R abdominal drain Restrictions Weight Bearing Restrictions: No       Mobility Bed Mobility Overal bed mobility: Needs Assistance Bed Mobility:  Rolling, Sidelying to Sit Rolling: Mod assist Sidelying to sit: Max assist       General bed mobility comments: Mod A for rolling side to side for peri care; Max A to bring LE off of bed and lift trunk after cleanup. pt showing some effort in pushing up from elbow    Transfers Overall transfer level: Needs assistance Equipment used: Ambulation equipment used Transfers: Sit to/from Stand, Bed to chair/wheelchair/BSC Sit to Stand: Mod assist, +2 safety/equipment, +2 physical assistance           General transfer comment: Initially Max A x 2 for stand in [place]. after repositioning and providing cushion for shins, pt able to stand with Mod A x 2 and transferred to chair via Stedy. Lift pad placed in chair w/ recs to use maximove for transfer back Transfer via Lift Equipment: Stedy   Balance Overall balance assessment: Needs assistance Sitting-balance support: Feet supported, Single extremity supported, Bilateral upper extremity supported Sitting balance-Leahy Scale: Poor Sitting balance - Comments: reliant on 1-2 UE support to maintain sitting balance,able to demo effort to correct when cued but unable to sustain Postural control: Right lateral lean Standing balance support: Bilateral upper extremity supported, During functional activity Standing balance-Leahy Scale: Poor                             ADL either performed or assessed with clinical judgement   ADL Overall ADL's : Needs assistance/impaired                             Toileting- Clothing Manipulation and Hygiene: Total assistance;+2 for physical assistance;Bed level Toileting - Clothing Manipulation Details (indicate cue type and reason): after bowel and urinary incontinence  General ADL Comments: Focus on OOB efforts, sitting balance and postural correction    Extremity/Trunk Assessment Upper Extremity Assessment Upper Extremity Assessment: LUE deficits/detail;RUE deficits/detail RUE  Deficits / Details: Impaired shoulder flexion RUE Coordination: WNL LUE Deficits / Details: Impaired shoulder flexion LUE Coordination: decreased fine motor;decreased gross motor   Lower Extremity Assessment Lower Extremity Assessment: Defer to PT evaluation        Vision   Vision Assessment?: No apparent visual deficits   Perception     Praxis      Cognition Arousal/Alertness: Awake/alert Behavior During Therapy: WFL for tasks assessed/performed, Flat affect Overall Cognitive Status: No family/caregiver present to determine baseline cognitive functioning Area of Impairment: Attention, Memory, Following commands, Safety/judgement, Awareness, Problem solving                   Current Attention Level: Sustained Memory: Decreased short-term memory Following Commands: Follows one step commands consistently, Follows one step commands with increased time Safety/Judgement: Decreased awareness of safety, Decreased awareness of deficits Awareness: Intellectual Problem Solving: Slow processing, Decreased initiation, Difficulty sequencing, Requires verbal cues, Requires tactile cues General Comments: pt with increased time for command following, pleasant throughout session but cueing needed for sequencing. on entry, pt with soiled bed soaked through sheets but reported unsure how to use call bell and/or did not call out for help (educated on call bell use during session) - showing some decreased insight and problem solving to express needs        Exercises      Shoulder Instructions       General Comments      Pertinent Vitals/ Pain       Pain Assessment Pain Assessment: Faces Faces Pain Scale: Hurts a little bit Pain Location: generalized, bottom with peri care Pain Descriptors / Indicators: Grimacing, Guarding Pain Intervention(s): Monitored during session  Home Living                                          Prior Functioning/Environment               Frequency  Min 2X/week        Progress Toward Goals  OT Goals(current goals can now be found in the care plan section)  Progress towards OT goals: Progressing toward goals  Acute Rehab OT Goals Patient Stated Goal: get stronger, work on standing more OT Goal Formulation: With patient Time For Goal Achievement: 12/29/22 Potential to Achieve Goals: Fair ADL Goals Pt Will Perform Grooming: with supervision;bed level Pt Will Perform Upper Body Bathing: with mod assist;sitting Pt Will Perform Upper Body Dressing: with min assist;sitting Additional ADL Goal #1: Pt will participate in 5 minutes of functional or therapeutic activity sitting edge of bed demonstrating Selective attention and Fair balance with Min guard assist to increase safety and independence with ADLs.  Plan Discharge plan remains appropriate    Co-evaluation                 AM-PAC OT "6 Clicks" Daily Activity     Outcome Measure   Help from another person eating meals?: A Little Help from another person taking care of personal grooming?: A Lot Help from another person toileting, which includes using toliet, bedpan, or urinal?: Total Help from another person bathing (including washing, rinsing, drying)?: Total Help from another person to put on and taking off regular upper body clothing?:  A Lot Help from another person to put on and taking off regular lower body clothing?: Total 6 Click Score: 10    End of Session Equipment Utilized During Treatment: Gait belt  OT Visit Diagnosis: Muscle weakness (generalized) (M62.81);Hemiplegia and hemiparesis;Other symptoms and signs involving cognitive function;Apraxia (R48.2);Ataxia, unspecified (R27.0) Hemiplegia - Right/Left: Left Hemiplegia - dominant/non-dominant: Dominant Hemiplegia - caused by: Cerebral infarction   Activity Tolerance Patient tolerated treatment well   Patient Left in chair;with call bell/phone within reach;Other (comment) (chair  pad under pt though no chair alarm call box in room)   Nurse Communication Mobility status;Need for lift equipment        Time: 1308-6578 OT Time Calculation (min): 30 min  Charges: OT General Charges $OT Visit: 1 Visit OT Treatments $Self Care/Home Management : 8-22 mins $Therapeutic Activity: 8-22 mins  Bradd Canary, OTR/L Acute Rehab Services Office: [phone]   Lorre Munroe 12/22/2022, 10:57 AM

## 2022-12-23 DIAGNOSIS — R1013 Epigastric pain: Secondary | ICD-10-CM | POA: Diagnosis not present

## 2022-12-23 LAB — CBC
HCT: 28.1 % — ABNORMAL LOW (ref 36.0–46.0)
Hemoglobin: 8.7 g/dL — ABNORMAL LOW (ref 12.0–15.0)
MCH: 30.9 pg (ref 26.0–34.0)
MCHC: 31 g/dL (ref 30.0–36.0)
MCV: 99.6 fL (ref 80.0–100.0)
Platelets: 186 10*3/uL (ref 150–400)
RBC: 2.82 MIL/uL — ABNORMAL LOW (ref 3.87–5.11)
RDW: 21.1 % — ABNORMAL HIGH (ref 11.5–15.5)
WBC: 9.4 10*3/uL (ref 4.0–10.5)
nRBC: 0.6 % — ABNORMAL HIGH (ref 0.0–0.2)

## 2022-12-23 LAB — GLUCOSE, CAPILLARY
Glucose-Capillary: 89 mg/dL (ref 70–99)
Glucose-Capillary: 107 mg/dL — ABNORMAL HIGH (ref 70–99)
Glucose-Capillary: 201 mg/dL — ABNORMAL HIGH (ref 70–99)

## 2022-12-23 LAB — RENAL FUNCTION PANEL
Albumin: 3.2 g/dL — ABNORMAL LOW (ref 3.5–5.0)
Anion gap: 9 (ref 5–15)
BUN: 26 mg/dL — ABNORMAL HIGH (ref 8–23)
CO2: 27 mmol/L (ref 22–32)
Calcium: 9 mg/dL (ref 8.9–10.3)
Chloride: 100 mmol/L (ref 98–111)
Creatinine, Ser: 3.48 mg/dL — ABNORMAL HIGH (ref 0.44–1.00)
GFR, Estimated: 13 mL/min — ABNORMAL LOW (ref >60)
Glucose, Bld: 93 mg/dL (ref 70–99)
Phosphorus: 4.2 mg/dL (ref 2.5–4.6)
Potassium: 4 mmol/L (ref 3.5–5.1)
Sodium: 136 mmol/L (ref 135–145)

## 2022-12-23 MED ORDER — OSMOLITE 1.5 CAL PO LIQD
237.0000 mL | Freq: Three times a day (TID) | ORAL | Status: DC
  Administered 2022-12-23 – 2022-12-25 (×5): 237 mL
  Filled 2022-12-23 (×7): qty 237

## 2022-12-23 MED ORDER — HEPARIN SODIUM (PORCINE) 1000 UNIT/ML DIALYSIS
1000.0000 [IU] | INTRAMUSCULAR | Status: DC | PRN
  Administered 2022-12-23: 1000 [IU] via INTRAVENOUS_CENTRAL

## 2022-12-23 MED ORDER — HEPARIN SODIUM (PORCINE) 1000 UNIT/ML DIALYSIS
1000.0000 [IU] | INTRAMUSCULAR | Status: DC | PRN
  Administered 2022-12-23: 1000 [IU]
  Filled 2022-12-23 (×2): qty 1

## 2022-12-23 MED ORDER — ANTICOAGULANT SODIUM CITRATE 4% (200MG/5ML) IV SOLN: 5.0000 mL | Status: DC | PRN

## 2022-12-23 MED ORDER — ALTEPLASE 2 MG IJ SOLR: 2.0000 mg | Freq: Once | INTRAMUSCULAR | Status: DC | PRN

## 2022-12-23 NOTE — Plan of Care (Signed)

## 2022-12-23 NOTE — Progress Notes (Signed)
Daily Progress Note   Patient Name: Samantha [name]       Date: 12/23/2022 DOB: 1945-09-10  Age: 77 y.o. MRN#: [id number] Attending Physician: Maretta Bees, MD Primary Care Physician: Patient, No Pcp Per Admit Date: 12/12/2022  Reason for Consultation/Follow-up: Establishing goals of care  Subjective: Patient was sitting in bed watching television and drinking an Ensure. She says she is doing good. Two of her daughters were at bedside.  Length of Stay: 11  Current Medications: Scheduled Meds:   apixaban  5 mg Oral BID   carvedilol  6.25 mg Oral BID WC   Chlorhexidine Gluconate Cloth  6 each Topical Q0600   Chlorhexidine Gluconate Cloth  6 each Topical Q0600   Chlorhexidine Gluconate Cloth  6 each Topical Q0600   darbepoetin (ARANESP) injection - DIALYSIS  60 mcg Subcutaneous Q Wed-1800   feeding supplement (OSMOLITE 1.5 CAL)  480 mL Per Tube Q24H   insulin aspart  0-9 Units Subcutaneous TID WC   insulin glargine-yfgn  16 Units Subcutaneous QHS   losartan  25 mg Oral Daily   multivitamin  1 tablet Oral QHS   sodium chloride flush  5 mL Intracatheter Q8H    Continuous Infusions:   PRN Meds: acetaminophen **OR** acetaminophen, fentaNYL (SUBLIMAZE) injection, melatonin, naLOXone (NARCAN)  injection, ondansetron (ZOFRAN) IV  Physical Exam Vitals reviewed.  Pulmonary:     Effort: Pulmonary effort is normal.  Neurological:     Mental Status: She is alert.  Psychiatric:        Mood and Affect: Mood normal.        Behavior: Behavior normal.             Vital Signs: BP 95/61   Pulse 100   Temp 98 F (36.7 C) (Oral)   Resp 18   Ht 4\' 11"  (1.499 m)   Wt 61.8 kg   SpO2 99%   BMI 27.52 kg/m  SpO2: SpO2: 99 % O2 Device: O2 Device: Room Air O2 Flow Rate: O2 Flow Rate  (L/min): 2 L/min  Intake/output summary:  Intake/Output Summary (Last 24 hours) at 12/23/2022 1552 Last data filed at 12/23/2022 1144 Gross per 24 hour  Intake --  Output 165 ml  Net -165 ml   LBM: Last BM Date : 12/19/22 Baseline Weight: Weight: 63.5 kg Most recent  weight: Weight: 61.8 kg       Palliative Assessment/Data:      Patient Active Problem List   Diagnosis Date Noted   Epigastric pain 12/13/2022   End-stage renal disease on hemodialysis (HCC) 12/13/2022   Failure to thrive in adult 12/13/2022   DM2 (diabetes mellitus, type 2) (HCC) 12/13/2022   Essential hypertension 12/13/2022   History of DVT (deep vein thrombosis) 12/13/2022   CVA (cerebral vascular accident) (HCC) 12/12/2022   Acute cholecystitis 12/12/2022    Palliative Care Assessment & Plan   Patient Profile: 77 y.o. 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 CT of abdomen/pelvis showed dilated gallbladder with stones including a stone which may be impacted in the neck of the gallbladder; acute cholecystitis is possible. Right upper quadrant ultrasound showed findings suggestive of acute cholecystitis. General surgery was consulted and cholecystostomy drain placed in IR.  Assessment: Patient's daughters were at bedside. Offered PMT support. They report no needs at this time.  Recommendations/Plan: FULL Code Full Scope of Care  Goals of Care and Additional Recommendations: Limitations on Scope of Treatment: Full Scope Treatment  Code Status:    Code Status Orders  (From admission, onward)           Start     Ordered   12/12/22 2115  Full code  Continuous       Question:  By:  Answer:  Consent: discussion documented in EHR   12/12/22 2115           Code  Status History     This patient has a current code status but no historical code status.       Prognosis:  Unable to determine  Discharge Planning: To Be Determined  Time 25 min  Thank you for allowing the Palliative Medicine Team to assist in the care of this patient.    Sherryll Burger, NP  Please contact Palliative Medicine Team phone at [phone] for questions and concerns.

## 2022-12-23 NOTE — Progress Notes (Signed)
[facility] Progress Note   Subjective:   Seen prior to HD. Feeling better, no new concerns. Denies SOB, CP, dizziness, nausea.   Objective Vitals:   12/22/22 2000 12/22/22 2335 12/23/22 0442 12/23/22 0736  BP: (!) 104/59 126/79 (!) 145/83   Pulse:  91    Resp:  20    Temp:  97.9 F (36.6 C) 98.9 F (37.2 C) (!) (P) 97.5 F (36.4 C)  TempSrc:  Oral Oral (P) Oral  SpO2:  98%    Weight:      Height:       Physical Exam General: Alert female in NAD Heart:RRR, no murmurs, rubs or gallops Lungs: CTA bilaterally, respirations unlabored on RA Abdomen: Soft, non-distended, +BS Extremities: No edema b/l lower extremities Dialysis Access:  [facility]  Additional Objective Labs: Basic Metabolic Panel: Recent Labs  Lab 12/18/22 1337 12/21/22 0850 12/23/22 0710  NA 136 137 136  K 4.3 4.8 4.0  CL 99 99 100  CO2 27 28 27   GLUCOSE 260* 252* 93  BUN 29* 31* 26*  CREATININE 3.11* 3.19* 3.48*  CALCIUM 8.4* 8.7* 9.0  PHOS 2.7 3.7 4.2   Liver Function Tests: Recent Labs  Lab 12/18/22 1337 12/21/22 0850 12/23/22 0710  ALBUMIN 3.0* 3.0* 3.2*   No results for input(s): "LIPASE", "AMYLASE" in the last 168 hours. CBC: Recent Labs  Lab 12/18/22 0602 12/19/22 0247 12/20/22 0228 12/21/22 0850 12/23/22 0710  WBC 9.0 11.2* 8.4 7.2 9.4  HGB 8.7* 8.7* 9.3* 8.2* 8.7*  HCT 27.3* 28.0* 28.4* 26.1* 28.1*  MCV 95.5 97.9 95.3 99.2 99.6  PLT 129* 130* 146* 144* 186   Blood Culture    Component Value Date/Time   SDES GALL BLADDER 12/16/2022 1519   SPECREQUEST NONE 12/16/2022 1519   CULT  12/16/2022 1519    No growth aerobically or anaerobically. Performed at [facility], [address], [place], [place] [address]    REPTSTATUS 12/21/2022 FINAL 12/16/2022 1519    Cardiac Enzymes: No results for input(s): "CKTOTAL", "CKMB", "CKMBINDEX", "TROPONINI" in the last 168 hours. CBG: Recent Labs  Lab 12/21/22 2227 12/22/22 0818 12/22/22 1252 12/22/22 1553  12/22/22 1923  GLUCAP 169* 170* 140* 113* 114*   Iron Studies: No results for input(s): "IRON", "TIBC", "TRANSFERRIN", "FERRITIN" in the last 72 hours. @lablastinr3 @ Studies/Results: No results found. Medications:  anticoagulant sodium citrate      apixaban  5 mg Oral BID   carvedilol  6.25 mg Oral BID WC   Chlorhexidine Gluconate Cloth  6 each Topical Q0600   Chlorhexidine Gluconate Cloth  6 each Topical Q0600   Chlorhexidine Gluconate Cloth  6 each Topical Q0600   darbepoetin (ARANESP) injection - DIALYSIS  60 mcg Subcutaneous Q Wed-1800   feeding supplement (OSMOLITE 1.5 CAL)  480 mL Per Tube Q24H   insulin aspart  0-9 Units Subcutaneous TID WC   insulin glargine-yfgn  16 Units Subcutaneous QHS   losartan  25 mg Oral Daily   multivitamin  1 tablet Oral QHS   sodium chloride flush  5 mL Intracatheter Q8H    Dialysis Orders: OP HD = orders that were entered for [facility]=3 hours 15 minutes- BFR 350 TDC- heparin 1500 units then 750 mid run   Assessment/Plan: Abdominal pain workup for cholestasis- surgery involved, status post cholecystostomy tube placement by IR. Completed ABX. Has PEG.  ESRD -HD in hospital MWF (moved from [place]- has not officially started HD at [facility]). Likely needs to be 3h79min on  d/c.  Agreed to permanent access placement - VVS consulted, considered marginal surgical candidate, may be better suited with perm cath per notes.  Appreciate their assistance.  Renal navigator working on outpatient HD placement near SNF. HTN/volume - BP variable, improved post HD. On coreg 6.25mg  BID only- apparently on 4 meds including HCTZ at home - does not appear to require these medications now that close to dry. Does not appear grossly overloaded.  Dry weight likely around 63kg per weights here. UF as tolerated.  Anemia - Hgb 8.7- improved. tsat 90%/Ferritin 3281 - no IV iron. Continue Aranesp qwk, started 4/22.  Secondary hyperparathyroidism -low phosphorus initially,  corrected with supplementation. Corrected calcium at goal, PTH 321 .  No vitamin D currently Nutrition-ALB 3.2, on Osmolite supplement /renal diet  Deconditioning/history of CVA- for nursing home placement. She is in bed today, will try HD in recliner before she discharges. Palliative care involved.   Rogers Blocker, PA-C 12/23/2022, 8:27 AM   [facility] Pager: [phone]

## 2022-12-23 NOTE — Progress Notes (Signed)
POST HD TX NOTE  12/23/22 1144  Vitals  Temp 98.1 F (36.7 C)  Temp Source Oral  BP (!) 109/51  MAP (mmHg) 68  BP Location Left Arm  BP Method Automatic  Patient Position (if appropriate) Lying  Pulse Rate 91  Pulse Rate Source Monitor  ECG Heart Rate 91  Resp 14  Oxygen Therapy  SpO2 100 %  O2 Device Room Air  Pulse Oximetry Type Continuous  During Treatment Monitoring  Intra-Hemodialysis Comments (S)   (post HD tx VS check)  Post Treatment  Dialyzer Clearance Clear  Duration of HD Treatment -hour(s) 3.25 hour(s)  Hemodialysis Intake (mL) 300 mL (NS boluses)  Liters Processed 78  Fluid Removed (mL) 100 mL  Tolerated HD Treatment No (Comment) (low bp, went out but recovered after NS boluses and UF off and placed in trendelenberg)  Post-Hemodialysis Comments (S)  tx completed w/ bp issues throughout, low bp resolved w/ NS boluses x3 (special order for third) and UF off and placed in trendeleberg. once bp dropped the UF remained off the remainder of tx.UF goal not met, blood rinsed back. VSS. Medication Admin: Heparin 1000 units bolus, Heparin Dwell 4500 units  Hemodialysis Catheter Right Internal jugular  No placement date or time found.   Placed prior to admission: Yes  Orientation: Right  Access Location: Internal jugular  Site Condition No complications  Blue Lumen Status Heparin locked;Dead end cap in place  Red Lumen Status Heparin locked;Dead end cap in place  Purple Lumen Status N/A  Catheter fill solution Heparin 1000 units/ml  Catheter fill volume (Arterial) 2.2 cc  Catheter fill volume (Venous) 2.3  Dressing Type Transparent  Dressing Status Antimicrobial disc in place;Clean, Dry, Intact  Drainage Description None  Dressing Change Due 12/28/22  Post treatment catheter status Capped and Clamped

## 2022-12-23 NOTE — Progress Notes (Signed)
PROGRESS NOTE        PATIENT DETAILS Name: Samantha [name] Age: 77 y.o. Sex: female Date of Birth: 1945-10-29 Admit Date: 12/12/2022 Admitting Physician Angie Fava, DO ZOX:[id number], No Pcp Per  Brief Summary: Patient is a 77 y.o.  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  Significant events: 4/20>> admit to [facility] 4/23>> HIDA scan positive 4/24>> surgery reevaluation-cholecystostomy drain recommended  Significant studies: 4/20>> CXR: No acute cardiopulmonary disease. 4/20>> CT abdomen/pelvis: Dilated gallbladder-with stones-including a stone which may be impacted in the neck of the gallbladder.  IVC filter in place. 4/20>> RUQ ultrasound: Cholelithiasis with positive Murphy sign. 4/23>> HIDA scan: Nonvisualization of gallbladder consistent with cystic duct obstruction. 4/27>> echo: EF 40-45%, regional wall motion abnormalities.  Significant microbiology data: None  Procedures: 4/24>> cholecystostomy drain placement by IR  Consults: General Surgery IR Vascular surgery.  Subjective: No major issues-lying comfortably in bed.  Medically stable for discharge-awaiting SNF bed.  Objective: Vitals: Blood pressure 96/61, pulse 93, temperature 98.1 F (36.7 C), temperature source Oral, resp. rate 12, height 4\' 11"  (1.499 m), weight 61.8 kg, SpO2 99 %.   Exam: Gen Exam:Alert awake-not in any distress HEENT:atraumatic, normocephalic Chest: B/L clear to auscultation anteriorly CVS:S1S2 regular Abdomen:soft non tender, non distended Extremities:no edema Neurology: Left-sided hemiparesis. Skin: no rash  Pertinent Labs/Radiology:    Latest Ref Rng & Units 12/23/2022    7:10 AM 12/21/2022    8:50 AM 12/20/2022    2:28 AM  CBC  WBC 4.0 - 10.5 K/uL 9.4  7.2  8.4    Hemoglobin 12.0 - 15.0 g/dL 8.7  8.2  9.3   Hematocrit 36.0 - 46.0 % 28.1  26.1  28.4   Platelets 150 - 400 K/uL 186  144  146     Lab Results  Component Value Date   NA 136 12/23/2022   K 4.0 12/23/2022   CL 100 12/23/2022   CO2 27 12/23/2022     Assessment/Plan: Acute calculus cholecystitis Initial imaging studies were equivocal-however HIDA scan in 4/23 wass +ve Reevaluated general surgery on 4/24-and underwent cholecystostomy tube placement Completed Rocephin/Flagyl x 1 week on 4/27 Tolerated regular diet-tolerating resumption of tube feeds.  CCS recommending outpatient follow-up with Dr. Festus Barren weeks IR following  ESRD on HD MWF Nephrology following. Arrangements being made for outpatient HD by nephrology team Per vascular surgery-no plans to place permanent HD access this admission.  Normocytic anemia Secondary to ESRD Iron/Aranesp defer to nephrology service  DVT Per CT-IVC filter in place Initially on IV heparin-since no plans to place permanent access-has been switched back to Eliquis 4/28.  For prior chart review-DVT had previous TDC site-this past February.  Plan anticoagulation x 3 months.  History of CVA with left residual hemiparesis Dysphagia with PEG tube in place Failure to thrive syndrome Discussed with patient's daughter-[name] on 4/25-at baseline patient is bedbound to wheelchair-bound.  She gets nocturnal feeds via PEG tube.    PT/OT/palliative care/nutrition services all following.  SNF planned.  Chronic HFrEF Euvolemic Volume removal HD Continue with Coreg/losartan  History of CAD Per daughter-patient needs to follow-up with cardiology in the outpatient setting Echo with slightly reduced EF and numerous wall motion abnormalities Currently without  any anginal symptoms Will need to establish with cardiology in the outpatient setting.  HTN BP stable-Coreg/losartan  DM-2 (A1c 7.3 on 4/21) CBGs relatively stable-borderline hypoglycemic  episode yesterday evening-will decrease Semglee back to 16 units-continue SSI. Given her overall frailty-she is not a candidate for tight glycemic control.   Follow/optimize  Recent Labs    12/22/22 1252 12/22/22 1553 12/22/22 1923  GLUCAP 140* 113* 114*     Nutrition Status: Nutrition Problem: Increased nutrient needs Etiology: acute illness Signs/Symptoms: estimated needs Interventions: MVI, Tube feeding  BMI: Estimated body mass index is 27.52 kg/m as calculated from the following:   Height as of this encounter: 4\' 11"  (1.499 m).   Weight as of this encounter: 61.8 kg.   Code status:   Code Status: Full Code   DVT Prophylaxis: SCDs Start: 12/12/22 2115 apixaban (ELIQUIS) tablet 5 mg    Family Communication: Called daughter-Joclyn-[phone]-updated 4/30   Disposition Plan: Status is: Inpatient Remains inpatient appropriate because: Severity of illness   Planned Discharge Destination:SNF-TOC team following.  Medically stable for discharge-awaiting insurance authorization/bed.   Diet: Diet Order             Diet regular Room service appropriate? Yes with Assist; Fluid consistency: Thin; Fluid restriction: 1200 mL Fluid  Diet effective now                     Antimicrobial agents: Anti-infectives (From admission, onward)    Start     Dose/Rate Route Frequency Ordered Stop   12/16/22 1500  cefOXitin (MEFOXIN) 2 g in sodium chloride 0.9 % 100 mL IVPB        2 g 200 mL/hr over 30 Minutes Intravenous  Once 12/16/22 1421 12/16/22 1536   12/13/22 0800  cefTRIAXone (ROCEPHIN) 2 g in sodium chloride 0.9 % 100 mL IVPB        2 g 200 mL/hr over 30 Minutes Intravenous Every 24 hours 12/13/22 0753 12/19/22 0942   12/13/22 0800  metroNIDAZOLE (FLAGYL) IVPB 500 mg        500 mg 100 mL/hr over 60 Minutes Intravenous Every 12 hours 12/13/22 0753 12/19/22 2221        MEDICATIONS: Scheduled Meds:  apixaban  5 mg Oral BID   carvedilol  6.25 mg Oral BID WC    Chlorhexidine Gluconate Cloth  6 each Topical Q0600   Chlorhexidine Gluconate Cloth  6 each Topical Q0600   Chlorhexidine Gluconate Cloth  6 each Topical Q0600   darbepoetin (ARANESP) injection - DIALYSIS  60 mcg Subcutaneous Q Wed-1800   feeding supplement (OSMOLITE 1.5 CAL)  480 mL Per Tube Q24H   insulin aspart  0-9 Units Subcutaneous TID WC   insulin glargine-yfgn  16 Units Subcutaneous QHS   losartan  25 mg Oral Daily   multivitamin  1 tablet Oral QHS   sodium chloride flush  5 mL Intracatheter Q8H   Continuous Infusions:  anticoagulant sodium citrate      PRN Meds:.acetaminophen **OR** acetaminophen, alteplase, anticoagulant sodium citrate, fentaNYL (SUBLIMAZE) injection, heparin, heparin, melatonin, naLOXone (NARCAN)  injection, ondansetron (ZOFRAN) IV   I have personally reviewed following labs and imaging studies  LABORATORY DATA: CBC: Recent Labs  Lab 12/18/22 0602 12/19/22 0247 12/20/22 0228 12/21/22 0850 12/23/22 0710  WBC 9.0 11.2* 8.4 7.2 9.4  HGB 8.7* 8.7* 9.3* 8.2* 8.7*  HCT 27.3* 28.0* 28.4* 26.1* 28.1*  MCV 95.5 97.9 95.3 99.2 99.6  PLT 129* 130* 146* 144* 186     Basic  Metabolic Panel: Recent Labs  Lab 12/17/22 0428 12/18/22 1337 12/21/22 0850 12/23/22 0710  NA 137 136 137 136  K 3.8 4.3 4.8 4.0  CL 97* 99 99 100  CO2 28 27 28 27   GLUCOSE 141* 260* 252* 93  BUN 16 29* 31* 26*  CREATININE 2.34* 3.11* 3.19* 3.48*  CALCIUM 8.5* 8.4* 8.7* 9.0  PHOS 2.5 2.7 3.7 4.2     GFR: Estimated Creatinine Clearance: 11 mL/min (A) (by C-G formula based on SCr of 3.48 mg/dL (H)).  Liver Function Tests: Recent Labs  Lab 12/17/22 0428 12/18/22 1337 12/21/22 0850 12/23/22 0710  ALBUMIN 3.1* 3.0* 3.0* 3.2*    No results for input(s): "LIPASE", "AMYLASE" in the last 168 hours. No results for input(s): "AMMONIA" in the last 168 hours.  Coagulation Profile: No results for input(s): "INR", "PROTIME" in the last 168 hours.   Cardiac Enzymes: No  results for input(s): "CKTOTAL", "CKMB", "CKMBINDEX", "TROPONINI" in the last 168 hours.  BNP (last 3 results) No results for input(s): "PROBNP" in the last 8760 hours.  Lipid Profile: No results for input(s): "CHOL", "HDL", "LDLCALC", "TRIG", "CHOLHDL", "LDLDIRECT" in the last 72 hours.  Thyroid Function Tests: No results for input(s): "TSH", "T4TOTAL", "FREET4", "T3FREE", "THYROIDAB" in the last 72 hours.  Anemia Panel: No results for input(s): "VITAMINB12", "FOLATE", "FERRITIN", "TIBC", "IRON", "RETICCTPCT" in the last 72 hours.   Urine analysis:    Component Value Date/Time   COLORURINE YELLOW 12/13/2022 1104   APPEARANCEUR HAZY (A) 12/13/2022 1104   LABSPEC 1.011 12/13/2022 1104   PHURINE 5.0 12/13/2022 1104   GLUCOSEU 150 (A) 12/13/2022 1104   HGBUR MODERATE (A) 12/13/2022 1104   BILIRUBINUR NEGATIVE 12/13/2022 1104   KETONESUR NEGATIVE 12/13/2022 1104   PROTEINUR 100 (A) 12/13/2022 1104   NITRITE NEGATIVE 12/13/2022 1104   LEUKOCYTESUR NEGATIVE 12/13/2022 1104    Sepsis Labs: Lactic Acid, Venous    Component Value Date/Time   LATICACIDVEN 1.0 12/13/2022 0146    MICROBIOLOGY: Recent Results (from the past 240 hour(s))  Aerobic/Anaerobic Culture w Gram Stain (surgical/deep wound)     Status: None   Collection Time: 12/16/22  3:19 PM   Specimen: Gallbladder; Abscess  Result Value Ref Range Status   Specimen Description GALL BLADDER  Final   Special Requests NONE  Final   Gram Stain NO WBC SEEN NO ORGANISMS SEEN   Final   Culture   Final    No growth aerobically or anaerobically. Performed at [facility], [address], [place], [place] [address]    Report Status 12/21/2022 FINAL  Final    RADIOLOGY STUDIES/RESULTS: No results found.   LOS: 11 days   Jeoffrey Massed, MD  [facility]    To contact the attending provider between 7A-7P or the covering provider during after hours 7P-7A, please log into the web site [email or web address] and  access using universal [facility] password for that web site. If you do not have the password, please call the hospital operator.  12/23/2022, 11:59 AM

## 2022-12-23 NOTE — Progress Notes (Signed)
Nutrition Follow-up  DOCUMENTATION CODES:   Not applicable  INTERVENTION:   Enteral nutrition via PEG tube:  Osmolite 1.5 at 40 mL/hr x 12 hours from 1800 to 0600 (480 mL per day) Tube feed at goal provides 720 kcal, 30 gm protein, and 366 mL free water daily. (Meets 42% calorie and 35% protein of estimated needs)  If pt eats < 50% of her meal, bolus 1 carton of Osmolite 1.5.  Each carton contains 355 kcal and 15 gm protein.  Renal Multivitamin w/ minerals daily Meal ordering with assist Assistance with setting up meal tray  Continue liberalized diet of regular   Continue 1200 mL fluid restriction  NUTRITION DIAGNOSIS:  Increased nutrient needs related to acute illness as evidenced by estimated needs. - Ongoing, being addressed via TF  GOAL:  Patient will meet greater than or equal to 90% of their needs - Ongoing, being addressed via TF  MONITOR:   PO intake, Labs, TF tolerance, I & O's  REASON FOR ASSESSMENT:   Consult Enteral/tube feeding initiation and management, Assessment of nutrition requirement/status  ASSESSMENT:   77 y.o. female presented to the ED with abdominal pain. PMH includes ESRD on HD (MWF), FTT w/ PEG tube, CVA w/ L-side residual weakness, T2DM, HTN, and DVT. Pt admitted with abdominal pain and possible cholecystitis.  4/24 - NPO; cholecystostomy drain placed; diet advanced to full liquids 4/25 - diet advanced to renal with 1200 mL FR  Pt in bed, daughters and RN at bedside. Pt did not eat breakfast due to being at HD, at lunch she only two a few bites of her sandwich. Daughters expressed that they would like for her to eat. They share that she was on Nepro at 40 mL/hr for a total of 700 mL at her previous facility. Explained that there is a caloric difference between Nepro and current TF of Osmolite 1.5; discussed why alternative formula was used and that can work on transitioning to home formula as tolerated. Daughters expressed that they would also like  for her to lose weight and be able to move more. RD explained that she is not really eating anything by mouth at this time and weight loss is not desired in the acute setting. Discussed adding a bolus of enteral nutrition after a meal if pt eats <50% of her meals, daughters were agreeable with that. Pt denied any nausea or vomiting today.   Pt currently below her EDW.   Discussed with MD, MD ok with bolus after meals as needed.   Meal Intake  4/26-4/30: 25% x 5 meals  Medications reviewed and include: NovoLog 0-6 units TID, Semglee, Rena-vit  Labs reviewed: Sodium 136, Potassium 4.0, Phosphorus 4.2, 24 hr CBGs 89-140  HD on 4/24 EDW: 64 kg Net UF: 100 mL Post-HD weight: 61.8 kg  Diet Order:   Diet Order             Diet regular Room service appropriate? Yes with Assist; Fluid consistency: Thin; Fluid restriction: 1200 mL Fluid  Diet effective now                  EDUCATION NEEDS:   Not appropriate for education at this time  Skin:  Skin Assessment: Reviewed RN Assessment  Last BM:  4/27  Height:  Ht Readings from Last 1 Encounters:  12/12/22 4\' 11"  (1.499 m)   Weight:  Wt Readings from Last 1 Encounters:  12/23/22 61.8 kg   BMI:  Body mass index is 27.52 kg/m.  Estimated Nutritional Needs:  Kcal:  1700-1900 Protein:  85-100 grams Fluid:  1L + UOP   Kirby Crigler RD, LDN Clinical Dietitian See [facility] for contact information.

## 2022-12-23 NOTE — Progress Notes (Signed)
Spoke to [employer] with [facility]. Pt has been accepted at [facility] on MWF. Pt can start on Friday and clinic is asking that family come to first appt with pt to assist with paperwork. Pt will need to arrive at 11:30 for 11:45 chair time. Update provided to attending, nephrologist, renal PA, pt's RN, and CSW. Inquired if pt has been up to chair in the room or if pt will need to trial HD in chair prior to d/c as previously thought. Will assist as needed.   Olivia Canter Renal Navigator [phone]

## 2022-12-24 DIAGNOSIS — R1013 Epigastric pain: Secondary | ICD-10-CM | POA: Diagnosis not present

## 2022-12-24 DIAGNOSIS — R627 Adult failure to thrive: Secondary | ICD-10-CM | POA: Diagnosis not present

## 2022-12-24 DIAGNOSIS — Z515 Encounter for palliative care: Secondary | ICD-10-CM | POA: Diagnosis not present

## 2022-12-24 LAB — RENAL FUNCTION PANEL
Albumin: 3.2 g/dL — ABNORMAL LOW (ref 3.5–5.0)
Anion gap: 12 (ref 5–15)
BUN: 27 mg/dL — ABNORMAL HIGH (ref 8–23)
CO2: 23 mmol/L (ref 22–32)
Calcium: 9.1 mg/dL (ref 8.9–10.3)
Chloride: 99 mmol/L (ref 98–111)
Creatinine, Ser: 2.91 mg/dL — ABNORMAL HIGH (ref 0.44–1.00)
GFR, Estimated: 16 mL/min — ABNORMAL LOW (ref >60)
Glucose, Bld: 217 mg/dL — ABNORMAL HIGH (ref 70–99)
Phosphorus: 2.7 mg/dL (ref 2.5–4.6)
Potassium: 4.9 mmol/L (ref 3.5–5.1)
Sodium: 134 mmol/L — ABNORMAL LOW (ref 135–145)

## 2022-12-24 LAB — CBC
HCT: 29.8 % — ABNORMAL LOW (ref 36.0–46.0)
Hemoglobin: 9.4 g/dL — ABNORMAL LOW (ref 12.0–15.0)
MCH: 31.4 pg (ref 26.0–34.0)
MCHC: 31.5 g/dL (ref 30.0–36.0)
MCV: 99.7 fL (ref 80.0–100.0)
Platelets: 181 10*3/uL (ref 150–400)
RBC: 2.99 MIL/uL — ABNORMAL LOW (ref 3.87–5.11)
RDW: 21.3 % — ABNORMAL HIGH (ref 11.5–15.5)
WBC: 7.6 10*3/uL (ref 4.0–10.5)
nRBC: 1.1 % — ABNORMAL HIGH (ref 0.0–0.2)

## 2022-12-24 LAB — GLUCOSE, CAPILLARY
Glucose-Capillary: 245 mg/dL — ABNORMAL HIGH (ref 70–99)
Glucose-Capillary: 291 mg/dL — ABNORMAL HIGH (ref 70–99)
Glucose-Capillary: 186 mg/dL — ABNORMAL HIGH (ref 70–99)
Glucose-Capillary: 210 mg/dL — ABNORMAL HIGH (ref 70–99)
Glucose-Capillary: 94 mg/dL (ref 70–99)

## 2022-12-24 MED ORDER — CHLORHEXIDINE GLUCONATE CLOTH 2 % EX PADS
6.0000 | MEDICATED_PAD | Freq: Every day | CUTANEOUS | Status: DC
  Administered 2022-12-24: 6 via TOPICAL

## 2022-12-24 MED ORDER — CARVEDILOL 3.125 MG PO TABS
3.1250 mg | ORAL_TABLET | Freq: Two times a day (BID) | ORAL | Status: DC
  Administered 2022-12-24: 3.125 mg via ORAL
  Filled 2022-12-24: qty 1

## 2022-12-24 NOTE — Plan of Care (Signed)

## 2022-12-24 NOTE — Progress Notes (Addendum)
Contacted Su Hoff with [facility] to make her aware that pt will not start tomorrow at clinic but possibly Monday. Contacted pt's daughter to make her aware that pt was approved for [facility] and provided days/time. Advised daughter that clinic is requesting family to come to first appt to assist with paperwork and inquired if that is possible. Will assist as needed.   Olivia Canter Renal Navigator [phone]  Addendum at 3:10 pm: Daughter states family can assist with paperwork at first treatment. Pt for possible d/c tomorrow after HD if pt able to tolerate HD in the chair. Contacted inpt HD unit to request pt receive treatment first shift if possible. Contacted Kim with [facility] to make clinic aware pt has a PEG.

## 2022-12-24 NOTE — TOC Progression Note (Signed)
Transition of Care [facility]) - Progression Note    Patient Details  Name: Samantha [name] MRN: [id number] Date of Birth: [birth year]  Transition of Care [facility]) CM/SW Contact  Mearl Latin, LCSW Phone Number: 12/24/2022, 1:43 PM  Clinical Narrative:    CSW spoke with patient's daughter, Kennith Maes. She asked questions about any other SNF options that her siblings were asking about and CSW explained that unfortunately the patient has only remained in the hospital waiting on an outpatient dialysis set up at [facility], which is the facility she requested patient go to and therefore we are unable to start the process over at this stage as patient will hopefully be able to discharge tomorrow. She reported understanding and provided CSW with patient's new insurance coverage, effective 12/23/22, [employer] PPO [phone]).  CSW initiated insurance process and received approval for [facility], Ref# [id number], effective 12/25/2022-12/29/2022.   CSW provided info to [place] with [facility] along with patient's Dialysis Schedule and PEG feed formula info.   Patient will have dialysis in the morning in a chair and hopefully be able to discharge after that. Renal Navigator aware.    Expected Discharge Plan: Skilled Nursing Facility Barriers to Discharge: Continued Medical Work up, SNF Pending bed offer, English as a second language teacher (Does not have [place] Medicaid yet and likely needs long term)  Expected Discharge Plan and Services In-house Referral: Clinical Social Work   Post Acute Care Choice: Skilled Nursing Facility Living arrangements for the past 2 months: Skilled Nursing Facility                                       Social Determinants of Health (SDOH) Interventions SDOH Screenings   Food Insecurity: No Food Insecurity (12/12/2022)  Housing: Low Risk  (12/12/2022)  Transportation Needs: No Transportation Needs (12/12/2022)  Utilities: Not At Risk (12/12/2022)  Tobacco Use:  Unknown (12/16/2022)    Readmission Risk Interventions     No data to display

## 2022-12-24 NOTE — Progress Notes (Signed)
[facility] Progress Note   Subjective:   Pt seen in room. Initially very hard to wake up, RN and I attempted sternal rub, pt would raise eyebrows briefly but would not wake up fully. VS were stable, no hypoglycemia. Eventually she awoke to pain and was alert and oriented x3, no post-ictal symptoms, chronic L sided weakness but strength appears to be at baseline, CN II-XII grossly intact with no facial asymmetry. ?Possibly just a very deep sleeper.  She reports feeling well, denies SOB, CP, dizziness, nausea.   Objective Vitals:   12/23/22 2000 12/24/22 0000 12/24/22 0400 12/24/22 0748  BP: 112/67 (!) 116/57 119/60 113/73  Pulse: 100 94 98   Resp: 16 16 18    Temp: 98.3 F (36.8 C) 98.5 F (36.9 C) 98.3 F (36.8 C) 98.2 F (36.8 C)  TempSrc: Oral Oral Oral Oral  SpO2: 97% 99% 98%   Weight:      Height:       Physical Exam General: Alert female in NAD Heart:RRR, no murmurs, rubs or gallops Lungs: CTA bilaterally, respirations unlabored Abdomen: Soft, non-distended, +BS Extremities: No edema b/l lower extremities Dialysis Access: [facility] Neuro: initially snoring and unresponsive, eventually awoke to pain, alert and oriented x3, no post-ictal symptoms, chronic L sided weakness but strength appears to be at baseline, CN II-XII grossly intact with no facial asymmetry  Additional Objective Labs: Basic Metabolic Panel: Recent Labs  Lab 12/18/22 1337 12/21/22 0850 12/23/22 0710  NA 136 137 136  K 4.3 4.8 4.0  CL 99 99 100  CO2 27 28 27   GLUCOSE 260* 252* 93  BUN 29* 31* 26*  CREATININE 3.11* 3.19* 3.48*  CALCIUM 8.4* 8.7* 9.0  PHOS 2.7 3.7 4.2   Liver Function Tests: Recent Labs  Lab 12/18/22 1337 12/21/22 0850 12/23/22 0710  ALBUMIN 3.0* 3.0* 3.2*   No results for input(s): "LIPASE", "AMYLASE" in the last 168 hours. CBC: Recent Labs  Lab 12/18/22 0602 12/19/22 0247 12/20/22 0228 12/21/22 0850 12/23/22 0710  WBC 9.0 11.2* 8.4 7.2 9.4  HGB 8.7*  8.7* 9.3* 8.2* 8.7*  HCT 27.3* 28.0* 28.4* 26.1* 28.1*  MCV 95.5 97.9 95.3 99.2 99.6  PLT 129* 130* 146* 144* 186   Blood Culture    Component Value Date/Time   SDES GALL BLADDER 12/16/2022 1519   SPECREQUEST NONE 12/16/2022 1519   CULT  12/16/2022 1519    No growth aerobically or anaerobically. Performed at [facility], [address] [place], [place] [address]    REPTSTATUS 12/21/2022 FINAL 12/16/2022 1519    CBG: Recent Labs  Lab 12/23/22 1220 12/23/22 1549 12/23/22 2131 12/24/22 0747 12/24/22 0919  GLUCAP 89 107* 201* 245* 291*    Medications:   apixaban  5 mg Oral BID   carvedilol  3.125 mg Oral BID WC   Chlorhexidine Gluconate Cloth  6 each Topical Q0600   Chlorhexidine Gluconate Cloth  6 each Topical Q0600   Chlorhexidine Gluconate Cloth  6 each Topical Q0600   darbepoetin (ARANESP) injection - DIALYSIS  60 mcg Subcutaneous Q Wed-1800   feeding supplement (OSMOLITE 1.5 CAL)  237 mL Per Tube TID WC   feeding supplement (OSMOLITE 1.5 CAL)  480 mL Per Tube Q24H   insulin aspart  0-9 Units Subcutaneous TID WC   insulin glargine-yfgn  16 Units Subcutaneous QHS   losartan  25 mg Oral Daily   multivitamin  1 tablet Oral QHS   sodium chloride flush  5 mL Intracatheter Q8H  Dialysis Orders: OP HD = orders that were entered for [employer]/[facility]=3 hours 15 minutes- BFR 350 TDC- heparin 1500 units then 750 mid run   Assessment/Plan: Abdominal pain workup for cholestasis- surgery involved, status post cholecystostomy tube placement by IR. Completed ABX. Has PEG. Denies abdominal pain at present.  ESRD -HD in hospital MWF (moved from [place]- has not officially started HD at [facility]). Likely needs to be 3h82min on d/c.  Agreed to permanent access placement - VVS consulted, considered marginal surgical candidate, may be better suited with perm cath per notes.  Appreciate their assistance.   HTN/volume - BP variable, improved post HD. On coreg 6.25mg  BID and losartan,  apparently on 4 meds including HCTZ at home - does not appear to require these medications now that close to dry. Does not appear grossly overloaded.  Dry weight likely around 62kg.  Anemia - Hgb 8.7- improved. tsat 90%/Ferritin 3281 - no IV iron. Continue Aranesp qwk, started 4/22.  Secondary hyperparathyroidism -low phosphorus initially, corrected with supplementation. Corrected calcium at goal, PTH 321 .  No vitamin D currently Nutrition-ALB 3.2, on Osmolite supplement /renal diet  Deconditioning/history of CVA- for SNF placement. Accepted to [facility] dialysis clinic. Did had episode of hypotension last HD and has only been sitting for 2.5 hours at a time in her room. Will attempt dialysis in chair tomorrow to make sure she can tolerate it before d/c.     Rogers Blocker, PA-C 12/24/2022, 10:18 AM  [facility] Pager: [phone]

## 2022-12-24 NOTE — Progress Notes (Signed)
Referring Physician(s): Dr Windell Norfolk  Supervising Physician: Mir, Mauri Reading  Patient Status:  [facility] - In-pt  Chief Complaint:  Cholecystitis   Subjective:  Percutaneous cholecystostomy 12/16/22 in IR She is resting No complaints Chole drain intact  Allergies: Patient has no known allergies.  Medications: Prior to Admission medications   Medication Sig Start Date End Date Taking? Authorizing Provider  amLODipine (NORVASC) 10 MG tablet Take 10 mg by mouth daily.   Yes [provider]  bisacodyl (DULCOLAX) 5 MG EC tablet Take 5 mg by mouth daily as needed for moderate constipation.   Yes [provider]  carvedilol (COREG) 25 MG tablet Take 25 mg by mouth 2 (two) times daily with a meal.   Yes [provider]  cholecalciferol (VITAMIN D) 1000 UNITS tablet Take 1,000 Units by mouth daily.   Yes [provider]  Cod Liver Oil 1000 MG CAPS Take 1 capsule by mouth daily.   Yes [provider]  insulin glargine (LANTUS) 100 UNIT/ML injection Inject 25 Units into the skin at bedtime.   Yes [provider]  insulin lispro (HUMALOG) 100 UNIT/ML injection Inject 19 Units into the skin 2 (two) times daily. 12 units before lunch and 7 units before supper   Yes [provider]  losartan-hydrochlorothiazide (HYZAAR) 100-25 MG per tablet Take 1 tablet by mouth daily.   Yes [provider]  traMADol (ULTRAM) 50 MG tablet Take 1 tablet (50 mg total) by mouth every 6 (six) hours as needed. 11/08/13  Yes Junious Silk, PA-C     Vital Signs: BP 113/73 (BP Location: Left Leg)   Pulse 98   Temp 98.2 F (36.8 C) (Oral)   Resp 18   Ht 4\' 11"  (1.499 m)   Wt 136 lb 3.9 oz (61.8 kg)   SpO2 98%   BMI 27.52 kg/m   Physical Exam Skin:    General: Skin is warm.     Comments: Site is c/d/I NT no bleeding Flushes well; bilious OP      Imaging: No results found.  Labs:  CBC: Recent Labs    12/19/22 0247  12/20/22 0228 12/21/22 0850 12/23/22 0710  WBC 11.2* 8.4 7.2 9.4  HGB 8.7* 9.3* 8.2* 8.7*  HCT 28.0* 28.4* 26.1* 28.1*  PLT 130* 146* 144* 186    COAGS: Recent Labs    12/13/22 0146 12/13/22 0845 12/15/22 2135 12/16/22 0249 12/17/22 0428 12/18/22 0602  INR 1.5*  --   --   --   --   --   APTT  --    < > 66* 97* 69* 65*   < > = values in this interval not displayed.    BMP: Recent Labs    12/17/22 0428 12/18/22 1337 12/21/22 0850 12/23/22 0710  NA 137 136 137 136  K 3.8 4.3 4.8 4.0  CL 97* 99 99 100  CO2 28 27 28 27   GLUCOSE 141* 260* 252* 93  BUN 16 29* 31* 26*  CALCIUM 8.5* 8.4* 8.7* 9.0  CREATININE 2.34* 3.11* 3.19* 3.48*  GFRNONAA 21* 15* 15* 13*    LIVER FUNCTION TESTS: Recent Labs    12/12/22 1917 12/13/22 0146 12/14/22 0501 12/15/22 1204 12/17/22 0428 12/18/22 1337 12/21/22 0850 12/23/22 0710  BILITOT 0.5 0.4 0.3  --   --   --   --   --   AST 27 25 27   --   --   --   --   --  ALT 26 24 23   --   --   --   --   --   ALKPHOS 63 54 47  --   --   --   --   --   PROT 8.1 7.1 6.2*  --   --   --   --   --   ALBUMIN 4.2 3.7 3.2*   < > 3.1* 3.0* 3.0* 3.2*   < > = values in this interval not displayed.    Drain Location: RUQ Size: Fr size: 10 Fr Date of placement: 12/16/22  Currently to: Drain collection device: gravity 24 hour output:  Output by Drain (mL) 12/22/22 0701 - 12/22/22 1900 12/22/22 1901 - 12/23/22 0700 12/23/22 0701 - 12/23/22 1900 12/23/22 1901 - 12/24/22 0700 12/24/22 0701 - 12/24/22 0900  Biliary Tube Cook slip-coat 10.2 Fr. RUQ 60 5  40   Gastrostomy/Enterostomy LLQ         Current examination: Flushes/aspirates easily.  Insertion site unremarkable. Suture and stat lock in place. Dressed appropriately.   Plan: Continue TID flushes with 5 cc NS. Record output Q shift. Dressing changes QD or PRN if soiled.  IR will continue to follow - please call with questions or concerns.  Home with drain Plans per [facility]CS Will plan for 8  week follow up in IR--- order in place Flush drain daily --- please Rx flushes at DC Record OP  Electronically Signed: Robet Leu, PA-C 12/24/2022, 9:00 AM   I spent a total of 15 Minutes at the the patient's bedside AND on the patient's hospital floor or unit, greater than 50% of which was counseling/coordinating care for perc chole drain

## 2022-12-24 NOTE — Progress Notes (Signed)
Occupational Therapy Treatment Patient Details Name: Samantha [name] MRN: [id number] DOB: 03-04-1946 Today's Date: 12/24/2022   History of present illness 77 y.o. female admitted on 4/20 with abdominal pain and concern for acute cholecystitis.   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   OT comments  Pt progressing gradually towards OT goals, eager for OOB activities. NT present and assisting with bed level bathing prior to OOB transfer with Max A needed for UB ADL and Total A for LB ADLs. Pt able to assist with bed mobility more at Mod A though continues to require up to Max A x 2 to stand into [place]. Discussed siting tolerance for HD w/ nursing staff.    Recommendations for follow up therapy are one component of a multi-disciplinary discharge planning process, led by the attending physician.  Recommendations may be updated based on patient status, additional functional criteria and insurance authorization.    Assistance Recommended at Discharge Frequent or constant Supervision/Assistance  Patient can return home with the following  Two people to help with walking and/or transfers;Two people to help with bathing/dressing/bathroom;Assistance with cooking/housework;Assistance with feeding;Direct supervision/assist for medications management;Direct supervision/assist for financial management;Assist for transportation;Help with stairs or ramp for entrance   Equipment Recommendations  Hospital bed;BSC/3in1    Recommendations for Other Services      Precautions / Restrictions Precautions Precautions: Fall Precaution Comments: PEG, R abdominal drain Restrictions Weight Bearing Restrictions: No       Mobility Bed Mobility Overal bed mobility: Needs Assistance Bed Mobility: Rolling, Sidelying to Sit Rolling: Mod assist Sidelying to sit: Mod assist, +2 for  safety/equipment       General bed mobility comments: Improving kicking LE off of bed and pushing through elbow to sit up    Transfers Overall transfer level: Needs assistance Equipment used: Ambulation equipment used Transfers: Sit to/from Stand, Bed to chair/wheelchair/BSC Sit to Stand: Max assist, +2 physical assistance, +2 safety/equipment           General transfer comment: Max A x 2 to stand into [place] and transfer to chair. limited by back pain today Transfer via Lift Equipment: St[place]   Balance Overall balance assessment: Needs assistance Sitting-balance support: Feet supported, Single extremity supported, Bilateral upper extremity supported Sitting balance-Leahy Scale: Fair Sitting balance - Comments: able to statically sit without UE support today   Standing balance support: Bilateral upper extremity supported, During functional activity Standing balance-Leahy Scale: Poor                             ADL either performed or assessed with clinical judgement   ADL Overall ADL's : Needs assistance/impaired         Upper Body Bathing: Maximal assistance;Cueing for sequencing;Bed level   Lower Body Bathing: Total assistance;+2 for physical assistance;Bed level   Upper Body Dressing : Maximal assistance;Bed level                     General ADL Comments: NT present, assisting with bed bath prior to OOB transfer    Extremity/Trunk Assessment Upper Extremity Assessment Upper Extremity Assessment: RUE deficits/detail;LUE deficits/detail RUE Deficits / Details: Impaired shoulder flexion LUE Deficits / Details: Impaired shoulder flexion   Lower Extremity Assessment Lower Extremity Assessment: Defer to PT evaluation        Vision   Vision Assessment?:  No apparent visual deficits   Perception     Praxis      Cognition Arousal/Alertness: Awake/alert Behavior During Therapy: WFL for tasks assessed/performed, Flat affect Overall Cognitive  Status: No family/caregiver present to determine baseline cognitive functioning Area of Impairment: Attention, Memory, Following commands, Safety/judgement, Awareness, Problem solving                   Current Attention Level: Sustained Memory: Decreased short-term memory Following Commands: Follows one step commands consistently, Follows one step commands with increased time Safety/Judgement: Decreased awareness of safety, Decreased awareness of deficits Awareness: Intellectual Problem Solving: Slow processing, Decreased initiation, Difficulty sequencing, Requires verbal cues, Requires tactile cues General Comments: pt with increased time for command following, pleasant throughout session but cueing needed for sequencing. memory deficits as pt with no recall of how to call for assist or how call bell works        Exercises      Shoulder Instructions       General Comments      Pertinent Vitals/ Pain       Pain Assessment Pain Assessment: Faces Faces Pain Scale: Hurts little more Pain Location: back, abdomen Pain Descriptors / Indicators: Grimacing, Guarding Pain Intervention(s): Monitored during session, Limited activity within patient's tolerance  Home Living                                          Prior Functioning/Environment              Frequency  Min 2X/week        Progress Toward Goals  OT Goals(current goals can now be found in the care plan section)  Progress towards OT goals: Progressing toward goals  Acute Rehab OT Goals Patient Stated Goal: get in the chair OT Goal Formulation: With patient Time For Goal Achievement: 12/29/22 Potential to Achieve Goals: Fair ADL Goals Pt Will Perform Grooming: with supervision;bed level Pt Will Perform Upper Body Bathing: with mod assist;sitting Pt Will Perform Upper Body Dressing: with min assist;sitting Additional ADL Goal #1: Pt will participate in 5 minutes of functional or  therapeutic activity sitting edge of bed demonstrating Selective attention and Fair balance with Min guard assist to increase safety and independence with ADLs.  Plan Discharge plan remains appropriate    Co-evaluation                 AM-PAC OT "6 Clicks" Daily Activity     Outcome Measure   Help from another person eating meals?: A Little Help from another person taking care of personal grooming?: A Lot Help from another person toileting, which includes using toliet, bedpan, or urinal?: Total Help from another person bathing (including washing, rinsing, drying)?: Total Help from another person to put on and taking off regular upper body clothing?: A Lot Help from another person to put on and taking off regular lower body clothing?: Total 6 Click Score: 10    End of Session Equipment Utilized During Treatment: Gait belt  OT Visit Diagnosis: Muscle weakness (generalized) (M62.81);Hemiplegia and hemiparesis;Other symptoms and signs involving cognitive function;Apraxia (R48.2);Ataxia, unspecified (R27.0) Hemiplegia - Right/Left: Left Hemiplegia - dominant/non-dominant: Dominant Hemiplegia - caused by: Cerebral infarction   Activity Tolerance Patient tolerated treatment well   Patient Left in chair;with call bell/phone within reach   Nurse Communication Mobility status;Need for lift equipment  Time: 1610-9604 OT Time Calculation (min): 22 min  Charges: OT General Charges $OT Visit: 1 Visit OT Treatments $Self Care/Home Management : 8-22 mins  Bradd Canary, OTR/L Acute Rehab Services Office: [phone]   Samantha [name] 12/24/2022, 12:30 PM

## 2022-12-24 NOTE — Progress Notes (Signed)
PROGRESS NOTE        PATIENT DETAILS Name: Samantha [name] Age: 77 y.o. Sex: female Date of Birth: 11-Apr-1946 Admit Date: 12/12/2022 Admitting Physician Angie Fava, DO ZOX:[id number], No Pcp Per  Brief Summary: Patient is a 77 y.o.  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  Significant events: 4/20>> admit to [facility] 4/23>> HIDA scan positive 4/24>> surgery reevaluation-cholecystostomy drain recommended  Significant studies: 4/20>> CXR: No acute cardiopulmonary disease. 4/20>> CT abdomen/pelvis: Dilated gallbladder-with stones-including a stone which may be impacted in the neck of the gallbladder.  IVC filter in place. 4/20>> RUQ ultrasound: Cholelithiasis with positive Murphy sign. 4/23>> HIDA scan: Nonvisualization of gallbladder consistent with cystic duct obstruction. 4/27>> echo: EF 40-45%, regional wall motion abnormalities.  Significant microbiology data: None  Procedures: 4/24>> cholecystostomy drain placement by IR  Consults: General Surgery IR Vascular surgery.  Subjective: Awake/alert when I saw her earlier this morning-no RUQ pain.  No major issues overnight.  Claims she sat on the bedside chair for 2 hours yesterday.   Objective: Vitals: Blood pressure 113/73, pulse 98, temperature 98.2 F (36.8 C), temperature source Oral, resp. rate 18, height 4\' 11"  (1.499 m), weight 61.8 kg, SpO2 98 %.   Exam: Gen Exam:Alert awake-not in any distress HEENT:atraumatic, normocephalic Chest: B/L clear to auscultation anteriorly CVS:S1S2 regular Abdomen:soft non tender, non distended Extremities:no edema Neurology: Left-sided hemiparesis. Skin: no rash  Pertinent Labs/Radiology:    Latest Ref Rng & Units 12/23/2022    7:10 AM 12/21/2022    8:50 AM  12/20/2022    2:28 AM  CBC  WBC 4.0 - 10.5 K/uL 9.4  7.2  8.4   Hemoglobin 12.0 - 15.0 g/dL 8.7  8.2  9.3   Hematocrit 36.0 - 46.0 % 28.1  26.1  28.4   Platelets 150 - 400 K/uL 186  144  146     Lab Results  Component Value Date   NA 136 12/23/2022   K 4.0 12/23/2022   CL 100 12/23/2022   CO2 27 12/23/2022     Assessment/Plan: Acute calculus cholecystitis Initial imaging studies were equivocal-however HIDA scan in 4/23 wass +ve Reevaluated general surgery on 4/24-and underwent cholecystostomy tube placement Completed Rocephin/Flagyl x 1 week on 4/27 Tolerated regular diet-tolerating resumption of tube feeds.  CCS recommending outpatient follow-up with Dr. Festus Barren weeks IR following  ESRD on HD MWF Nephrology following. Arrangements being made for outpatient HD by nephrology team Per vascular surgery-no plans to place permanent HD access this admission.  Normocytic anemia Secondary to ESRD Iron/Aranesp defer to nephrology service  DVT Per CT-IVC filter in place Initially on IV heparin-since no plans to place permanent access-has been switched back to Eliquis 4/28.  For prior chart review-DVT had previous TDC site-this past February.  Plan anticoagulation x 3 months.  History of CVA with left residual hemiparesis Dysphagia with PEG tube in place Failure to thrive syndrome Discussed with patient's daughter-[name] on 4/25-at baseline patient is bedbound to wheelchair-bound.  She gets nocturnal feeds via PEG tube.    PT/OT/palliative care/nutrition services all following.  SNF planned.  Chronic HFrEF Euvolemic Volume removal HD Have decreased scheduled Coreg to 3.125-hold losartan-as patient having episodes of hypotension with dialysis.  History of CAD Per daughter-patient needs to follow-up with cardiology in the outpatient setting Echo with slightly reduced EF and numerous wall motion abnormalities Currently without any anginal symptoms Will need to establish  with cardiology in the outpatient setting.  HTN BP stable-soft on days of dialysis-allow permissive hypertension-requires IV fluid boluses/for hypotension during days of dialysis. Continue Coreg Stop losartan.  DM-2 (A1c 7.3 on 4/21) CBGs stable-continue Semglee 16 units daily, SSI.  Recent Labs    12/23/22 2131 12/24/22 0747 12/24/22 0919  GLUCAP 201* 245* 291*     Nutrition Status: Nutrition Problem: Increased nutrient needs Etiology: acute illness Signs/Symptoms: estimated needs Interventions: MVI, Tube feeding  BMI: Estimated body mass index is 27.52 kg/m as calculated from the following:   Height as of this encounter: 4\' 11"  (1.499 m).   Weight as of this encounter: 61.8 kg.   Code status:   Code Status: Full Code   DVT Prophylaxis: SCDs Start: 12/12/22 2115 apixaban (ELIQUIS) tablet 5 mg    Family Communication: Called daughter-Joclyn-[phone]-updated 4/30   Disposition Plan: Status is: Inpatient Remains inpatient appropriate because: Severity of illness   Planned Discharge Destination:SNF-TOC team following.  Medically stable for discharge-awaiting insurance authorization/bed.   Diet: Diet Order             Diet regular Room service appropriate? Yes with Assist; Fluid consistency: Thin; Fluid restriction: 1200 mL Fluid  Diet effective now                     Antimicrobial agents: Anti-infectives (From admission, onward)    Start     Dose/Rate Route Frequency Ordered Stop   12/16/22 1500  cefOXitin (MEFOXIN) 2 g in sodium chloride 0.9 % 100 mL IVPB        2 g 200 mL/hr over 30 Minutes Intravenous  Once 12/16/22 1421 12/16/22 1536   12/13/22 0800  cefTRIAXone (ROCEPHIN) 2 g in sodium chloride 0.9 % 100 mL IVPB        2 g 200 mL/hr over 30 Minutes Intravenous Every 24 hours 12/13/22 0753 12/19/22 0942   12/13/22 0800  metroNIDAZOLE (FLAGYL) IVPB 500 mg        500 mg 100 mL/hr over 60 Minutes Intravenous Every 12 hours 12/13/22 0753  12/19/22 2221        MEDICATIONS: Scheduled Meds:  apixaban  5 mg Oral BID   carvedilol  3.125 mg Oral BID WC   Chlorhexidine Gluconate Cloth  6 each Topical Q0600   Chlorhexidine Gluconate Cloth  6 each Topical Q0600   Chlorhexidine Gluconate Cloth  6 each Topical Q0600   darbepoetin (ARANESP) injection - DIALYSIS  60 mcg Subcutaneous Q Wed-1800   feeding supplement (OSMOLITE 1.5 CAL)  237 mL Per Tube TID WC   feeding supplement (OSMOLITE 1.5 CAL)  480 mL Per Tube Q24H   insulin aspart  0-9 Units Subcutaneous TID WC   insulin glargine-yfgn  16 Units Subcutaneous QHS   losartan  25 mg Oral Daily   multivitamin  1 tablet Oral QHS   sodium chloride flush  5 mL Intracatheter Q8H   Continuous Infusions:    PRN Meds:.acetaminophen **OR** acetaminophen, fentaNYL (SUBLIMAZE) injection, melatonin, naLOXone (NARCAN)  injection, ondansetron (ZOFRAN) IV   I have personally reviewed following labs and imaging studies  LABORATORY DATA: CBC: Recent Labs  Lab 12/18/22 0602 12/19/22 0247 12/20/22 0228 12/21/22 0850 12/23/22 0710  WBC 9.0 11.2* 8.4 7.2 9.4  HGB 8.7* 8.7* 9.3* 8.2* 8.7*  HCT  27.3* 28.0* 28.4* 26.1* 28.1*  MCV 95.5 97.9 95.3 99.2 99.6  PLT 129* 130* 146* 144* 186     Basic Metabolic Panel: Recent Labs  Lab 12/18/22 1337 12/21/22 0850 12/23/22 0710  NA 136 137 136  K 4.3 4.8 4.0  CL 99 99 100  CO2 27 28 27   GLUCOSE 260* 252* 93  BUN 29* 31* 26*  CREATININE 3.11* 3.19* 3.48*  CALCIUM 8.4* 8.7* 9.0  PHOS 2.7 3.7 4.2     GFR: Estimated Creatinine Clearance: 11 mL/min (A) (by C-G formula based on SCr of 3.48 mg/dL (H)).  Liver Function Tests: Recent Labs  Lab 12/18/22 1337 12/21/22 0850 12/23/22 0710  ALBUMIN 3.0* 3.0* 3.2*    No results for input(s): "LIPASE", "AMYLASE" in the last 168 hours. No results for input(s): "AMMONIA" in the last 168 hours.  Coagulation Profile: No results for input(s): "INR", "PROTIME" in the last 168  hours.   Cardiac Enzymes: No results for input(s): "CKTOTAL", "CKMB", "CKMBINDEX", "TROPONINI" in the last 168 hours.  BNP (last 3 results) No results for input(s): "PROBNP" in the last 8760 hours.  Lipid Profile: No results for input(s): "CHOL", "HDL", "LDLCALC", "TRIG", "CHOLHDL", "LDLDIRECT" in the last 72 hours.  Thyroid Function Tests: No results for input(s): "TSH", "T4TOTAL", "FREET4", "T3FREE", "THYROIDAB" in the last 72 hours.  Anemia Panel: No results for input(s): "VITAMINB12", "FOLATE", "FERRITIN", "TIBC", "IRON", "RETICCTPCT" in the last 72 hours.   Urine analysis:    Component Value Date/Time   COLORURINE YELLOW 12/13/2022 1104   APPEARANCEUR HAZY (A) 12/13/2022 1104   LABSPEC 1.011 12/13/2022 1104   PHURINE 5.0 12/13/2022 1104   GLUCOSEU 150 (A) 12/13/2022 1104   HGBUR MODERATE (A) 12/13/2022 1104   BILIRUBINUR NEGATIVE 12/13/2022 1104   KETONESUR NEGATIVE 12/13/2022 1104   PROTEINUR 100 (A) 12/13/2022 1104   NITRITE NEGATIVE 12/13/2022 1104   LEUKOCYTESUR NEGATIVE 12/13/2022 1104    Sepsis Labs: Lactic Acid, Venous    Component Value Date/Time   LATICACIDVEN 1.0 12/13/2022 0146    MICROBIOLOGY: Recent Results (from the past 240 hour(s))  Aerobic/Anaerobic Culture w Gram Stain (surgical/deep wound)     Status: None   Collection Time: 12/16/22  3:19 PM   Specimen: Gallbladder; Abscess  Result Value Ref Range Status   Specimen Description GALL BLADDER  Final   Special Requests NONE  Final   Gram Stain NO WBC SEEN NO ORGANISMS SEEN   Final   Culture   Final    No growth aerobically or anaerobically. Performed at [facility], [address], [place], [place] [address]    Report Status 12/21/2022 FINAL  Final    RADIOLOGY STUDIES/RESULTS: No results found.   LOS: 12 days   Jeoffrey Massed, MD  [facility]    To contact the attending provider between 7A-7P or the covering provider during after hours 7P-7A, please log into  the web site [email or web address] and access using universal [employer] password for that web site. If you do not have the password, please call the hospital operator.  12/24/2022, 10:00 AM

## 2022-12-25 DIAGNOSIS — D509 Iron deficiency anemia, unspecified: Secondary | ICD-10-CM | POA: Diagnosis not present

## 2022-12-25 DIAGNOSIS — E119 Type 2 diabetes mellitus without complications: Secondary | ICD-10-CM | POA: Diagnosis not present

## 2022-12-25 DIAGNOSIS — Z931 Gastrostomy status: Secondary | ICD-10-CM | POA: Diagnosis not present

## 2022-12-25 DIAGNOSIS — R109 Unspecified abdominal pain: Secondary | ICD-10-CM | POA: Diagnosis not present

## 2022-12-25 DIAGNOSIS — I639 Cerebral infarction, unspecified: Secondary | ICD-10-CM | POA: Diagnosis not present

## 2022-12-25 DIAGNOSIS — I132 Hypertensive heart and chronic kidney disease with heart failure and with stage 5 chronic kidney disease, or end stage renal disease: Secondary | ICD-10-CM | POA: Diagnosis not present

## 2022-12-25 DIAGNOSIS — I2510 Atherosclerotic heart disease of native coronary artery without angina pectoris: Secondary | ICD-10-CM | POA: Diagnosis not present

## 2022-12-25 DIAGNOSIS — Z23 Encounter for immunization: Secondary | ICD-10-CM | POA: Diagnosis not present

## 2022-12-25 DIAGNOSIS — R0902 Hypoxemia: Secondary | ICD-10-CM | POA: Diagnosis not present

## 2022-12-25 DIAGNOSIS — I120 Hypertensive chronic kidney disease with stage 5 chronic kidney disease or end stage renal disease: Secondary | ICD-10-CM | POA: Diagnosis not present

## 2022-12-25 DIAGNOSIS — I82729 Chronic embolism and thrombosis of deep veins of unspecified upper extremity: Secondary | ICD-10-CM | POA: Diagnosis not present

## 2022-12-25 DIAGNOSIS — Z992 Dependence on renal dialysis: Secondary | ICD-10-CM | POA: Diagnosis not present

## 2022-12-25 DIAGNOSIS — K810 Acute cholecystitis: Secondary | ICD-10-CM | POA: Diagnosis not present

## 2022-12-25 DIAGNOSIS — Z434 Encounter for attention to other artificial openings of digestive tract: Secondary | ICD-10-CM | POA: Diagnosis not present

## 2022-12-25 DIAGNOSIS — Z9049 Acquired absence of other specified parts of digestive tract: Secondary | ICD-10-CM | POA: Diagnosis not present

## 2022-12-25 DIAGNOSIS — E118 Type 2 diabetes mellitus with unspecified complications: Secondary | ICD-10-CM | POA: Diagnosis not present

## 2022-12-25 DIAGNOSIS — I69391 Dysphagia following cerebral infarction: Secondary | ICD-10-CM | POA: Diagnosis not present

## 2022-12-25 DIAGNOSIS — N250 Renal osteodystrophy: Secondary | ICD-10-CM | POA: Diagnosis not present

## 2022-12-25 DIAGNOSIS — Z743 Need for continuous supervision: Secondary | ICD-10-CM | POA: Diagnosis not present

## 2022-12-25 DIAGNOSIS — C220 Liver cell carcinoma: Secondary | ICD-10-CM | POA: Diagnosis not present

## 2022-12-25 DIAGNOSIS — E569 Vitamin deficiency, unspecified: Secondary | ICD-10-CM | POA: Diagnosis not present

## 2022-12-25 DIAGNOSIS — W19XXXD Unspecified fall, subsequent encounter: Secondary | ICD-10-CM | POA: Diagnosis not present

## 2022-12-25 DIAGNOSIS — K5900 Constipation, unspecified: Secondary | ICD-10-CM | POA: Diagnosis not present

## 2022-12-25 DIAGNOSIS — D631 Anemia in chronic kidney disease: Secondary | ICD-10-CM | POA: Diagnosis not present

## 2022-12-25 DIAGNOSIS — F0150 Vascular dementia without behavioral disturbance: Secondary | ICD-10-CM | POA: Diagnosis not present

## 2022-12-25 DIAGNOSIS — R5381 Other malaise: Secondary | ICD-10-CM | POA: Diagnosis not present

## 2022-12-25 DIAGNOSIS — Z7401 Bed confinement status: Secondary | ICD-10-CM | POA: Diagnosis not present

## 2022-12-25 DIAGNOSIS — R410 Disorientation, unspecified: Secondary | ICD-10-CM | POA: Diagnosis not present

## 2022-12-25 DIAGNOSIS — I69921 Dysphasia following unspecified cerebrovascular disease: Secondary | ICD-10-CM | POA: Diagnosis not present

## 2022-12-25 DIAGNOSIS — R1013 Epigastric pain: Secondary | ICD-10-CM | POA: Diagnosis not present

## 2022-12-25 DIAGNOSIS — N186 End stage renal disease: Secondary | ICD-10-CM | POA: Diagnosis not present

## 2022-12-25 DIAGNOSIS — R627 Adult failure to thrive: Secondary | ICD-10-CM | POA: Diagnosis not present

## 2022-12-25 DIAGNOSIS — I69354 Hemiplegia and hemiparesis following cerebral infarction affecting left non-dominant side: Secondary | ICD-10-CM | POA: Diagnosis not present

## 2022-12-25 DIAGNOSIS — N2581 Secondary hyperparathyroidism of renal origin: Secondary | ICD-10-CM | POA: Diagnosis not present

## 2022-12-25 DIAGNOSIS — R21 Rash and other nonspecific skin eruption: Secondary | ICD-10-CM | POA: Diagnosis not present

## 2022-12-25 DIAGNOSIS — F430 Acute stress reaction: Secondary | ICD-10-CM | POA: Diagnosis not present

## 2022-12-25 DIAGNOSIS — I10 Essential (primary) hypertension: Secondary | ICD-10-CM | POA: Diagnosis not present

## 2022-12-25 LAB — CBC
HCT: 27.8 % — ABNORMAL LOW (ref 36.0–46.0)
Hemoglobin: 8.2 g/dL — ABNORMAL LOW (ref 12.0–15.0)
MCH: 30.7 pg (ref 26.0–34.0)
MCHC: 29.5 g/dL — ABNORMAL LOW (ref 30.0–36.0)
MCV: 104.1 fL — ABNORMAL HIGH (ref 80.0–100.0)
Platelets: 186 10*3/uL (ref 150–400)
RBC: 2.67 MIL/uL — ABNORMAL LOW (ref 3.87–5.11)
RDW: 22 % — ABNORMAL HIGH (ref 11.5–15.5)
WBC: 7.1 10*3/uL (ref 4.0–10.5)
nRBC: 0.4 % — ABNORMAL HIGH (ref 0.0–0.2)

## 2022-12-25 LAB — RENAL FUNCTION PANEL
Albumin: 3 g/dL — ABNORMAL LOW (ref 3.5–5.0)
Anion gap: 11 (ref 5–15)
BUN: 26 mg/dL — ABNORMAL HIGH (ref 8–23)
CO2: 29 mmol/L (ref 22–32)
Calcium: 9 mg/dL (ref 8.9–10.3)
Chloride: 95 mmol/L — ABNORMAL LOW (ref 98–111)
Creatinine, Ser: 3.88 mg/dL — ABNORMAL HIGH (ref 0.44–1.00)
GFR, Estimated: 11 mL/min — ABNORMAL LOW (ref >60)
Glucose, Bld: 235 mg/dL — ABNORMAL HIGH (ref 70–99)
Phosphorus: 4.1 mg/dL (ref 2.5–4.6)
Potassium: 4.4 mmol/L (ref 3.5–5.1)
Sodium: 135 mmol/L (ref 135–145)

## 2022-12-25 LAB — GLUCOSE, CAPILLARY
Glucose-Capillary: 112 mg/dL — ABNORMAL HIGH (ref 70–99)
Glucose-Capillary: 209 mg/dL — ABNORMAL HIGH (ref 70–99)

## 2022-12-25 MED ORDER — INSULIN GLARGINE 100 UNIT/ML ~~LOC~~ SOLN: 16.0000 [IU] | Freq: Every day | SUBCUTANEOUS | 11 refills | Status: DC

## 2022-12-25 MED ORDER — INSULIN ASPART 100 UNIT/ML IJ SOLN: INTRAMUSCULAR | 11 refills | Status: AC

## 2022-12-25 MED ORDER — OSMOLITE 1.5 CAL PO LIQD: 237.0000 mL | Freq: Three times a day (TID) | ORAL | 0 refills | Status: DC

## 2022-12-25 MED ORDER — RENA-VITE PO TABS: 1.0000 | ORAL_TABLET | Freq: Every day | ORAL | 0 refills | Status: DC

## 2022-12-25 MED ORDER — ACETAMINOPHEN 325 MG PO TABS: 650.0000 mg | ORAL_TABLET | Freq: Four times a day (QID) | ORAL | Status: DC | PRN

## 2022-12-25 MED ORDER — OSMOLITE 1.5 CAL PO LIQD: 480.0000 mL | ORAL | 0 refills | Status: DC

## 2022-12-25 MED ORDER — APIXABAN 5 MG PO TABS: 5.0000 mg | ORAL_TABLET | Freq: Two times a day (BID) | ORAL | Status: DC

## 2022-12-25 MED ORDER — CARVEDILOL 3.125 MG PO TABS: 25.0000 mg | ORAL_TABLET | Freq: Two times a day (BID) | ORAL | Status: DC

## 2022-12-25 MED ORDER — CARVEDILOL 3.125 MG PO TABS: 3.1250 mg | ORAL_TABLET | Freq: Two times a day (BID) | ORAL | Status: DC

## 2022-12-25 NOTE — Progress Notes (Signed)
Pt receiving HD in chair today and tolerating per renal NP. Pt for d/c today. Contacted Kim with [facility] to advise her that pt will d/c today and start on Monday. Selena Batten also advised that daughter states that family can assist with paperwork on first day. D/C summary and last renal note faxed to [facility] for continuation of care. Arrangements added to AVS.   Olivia Canter Renal Navigator [phone]

## 2022-12-25 NOTE — Progress Notes (Signed)
[facility] Progress Note   Subjective: HD in chair, tolerating well. Has SNF placment, DC orders in place.   Objective Vitals:   12/25/22 0845 12/25/22 0900 12/25/22 0930 12/25/22 1000  BP: (!) 108/57 (!) 118/56 (!) 126/55 119/65  Pulse: 95 89 88 93  Resp: 11 12 14 17   Temp: 98.5 F (36.9 C)     TempSrc:      SpO2: 98% 100% 100%   Weight:      Height:       Physical Exam General: Pleasant elderly female in NAD Heart: S1,S2 RRR No M/R/G Lungs: CTAB anteriorly. No WOB Abdomen: NABS, soft Extremities: No LE edema Neuro: Oriented X 3, follows commands Dialysis Access: [facility]  Additional Objective Labs: Basic Metabolic Panel: Recent Labs  Lab 12/23/22 0710 12/24/22 1136 12/25/22 0900  NA 136 134* 135  K 4.0 4.9 4.4  CL 100 99 95*  CO2 27 23 29   GLUCOSE 93 217* 235*  BUN 26* 27* 26*  CREATININE 3.48* 2.91* 3.88*  CALCIUM 9.0 9.1 9.0  PHOS 4.2 2.7 4.1   Liver Function Tests: Recent Labs  Lab 12/23/22 0710 12/24/22 1136 12/25/22 0900  ALBUMIN 3.2* 3.2* 3.0*   No results for input(s): "LIPASE", "AMYLASE" in the last 168 hours. CBC: Recent Labs  Lab 12/20/22 0228 12/21/22 0850 12/23/22 0710 12/24/22 1136 12/25/22 0908  WBC 8.4 7.2 9.4 7.6 7.1  HGB 9.3* 8.2* 8.7* 9.4* 8.2*  HCT 28.4* 26.1* 28.1* 29.8* 27.8*  MCV 95.3 99.2 99.6 99.7 104.1*  PLT 146* 144* 186 181 186   Blood Culture    Component Value Date/Time   SDES GALL BLADDER 12/16/2022 1519   SPECREQUEST NONE 12/16/2022 1519   CULT  12/16/2022 1519    No growth aerobically or anaerobically. Performed at [facility], [address] [place], [place] [address]    REPTSTATUS 12/21/2022 FINAL 12/16/2022 1519    Cardiac Enzymes: No results for input(s): "CKTOTAL", "CKMB", "CKMBINDEX", "TROPONINI" in the last 168 hours. CBG: Recent Labs  Lab 12/24/22 0747 12/24/22 0919 12/24/22 1202 12/24/22 1621 12/24/22 2147  GLUCAP 245* 291* 186* 210* 94   Iron Studies: No results  for input(s): "IRON", "TIBC", "TRANSFERRIN", "FERRITIN" in the last 72 hours. @lablastinr3 @ Studies/Results: No results found. Medications:   apixaban  5 mg Oral BID   carvedilol  3.125 mg Oral BID WC   Chlorhexidine Gluconate Cloth  6 each Topical Q0600   darbepoetin (ARANESP) injection - DIALYSIS  60 mcg Subcutaneous Q Wed-1800   feeding supplement (OSMOLITE 1.5 CAL)  237 mL Per Tube TID WC   feeding supplement (OSMOLITE 1.5 CAL)  480 mL Per Tube Q24H   insulin aspart  0-9 Units Subcutaneous TID WC   insulin glargine-yfgn  16 Units Subcutaneous QHS   multivitamin  1 tablet Oral QHS   sodium chloride flush  5 mL Intracatheter Q8H     Dialysis Orders: OP HD = orders that were entered for [facility]/SW=3 hours 15 minutes- BFR 350 TDC- heparin 1500 units then 750 mid run    Assessment/Plan: Abdominal pain workup for cholestasis- surgery involved, status post cholecystostomy tube placement by IR. Completed ABX. Has PEG. Denies abdominal pain at present.  ESRD -HD in hospital MWF (moved from [place]- has not officially started HD at [facility]). Likely needs to be 3h16min on d/c.  Agreed to permanent access placement - VVS consulted, considered marginal surgical candidate, may be better suited with perm cath per notes.  Appreciate their  assistance.   HTN/volume - BP variable, improved post HD. On coreg 6.25mg  BID and losartan, apparently on 4 meds including HCTZ at home - does not appear to require these medications now that close to dry. Does not appear grossly overloaded.  Dry weight likely around 62kg.  Anemia - Hgb 8.7- improved. tsat 90%/Ferritin 3281 - no IV iron. Continue Aranesp qwk, started 4/22.  Secondary hyperparathyroidism -low phosphorus initially, corrected with supplementation. Corrected calcium at goal, PTH 321 .  No vitamin D currently Nutrition-ALB 3.2, on Osmolite supplement /renal diet  Deconditioning/history of CVA- for SNF placement. Accepted to [facility]  dialysis clinic. Did had episode of hypotension last HD and has only been sitting for 2.5 hours at a time in her room. Will attempt dialysis in chair today to make sure she can tolerate it before d/c.   Shelia Magallon H. Clarine Elrod NP-C 12/25/2022, 10:24 AM  [employer] [phone]

## 2022-12-25 NOTE — Progress Notes (Signed)
PT Cancellation Note  Patient Details Name: Samantha [name] MRN: [id number] DOB: 01-May-1946   Cancelled Treatment:    Reason Eval/Treat Not Completed: Patient at procedure or test/unavailable. Pt currently off unit at dialysis. PT will hold and follow up as available/appropriate. Thank you.    Leonie Man 12/25/2022, 8:48 AM

## 2022-12-25 NOTE — Discharge Summary (Signed)
PATIENT DETAILS Name: Samantha [name] Age: 77 y.o. Sex: female Date of Birth: 1946/06/16 MRN: [id number]. Admitting Physician: Angie Fava, DO XBM:[id number], No Pcp Per  Admit Date: 12/12/2022 Discharge date: 12/25/2022  Recommendations for Outpatient Follow-up:  Follow up with PCP in 1-2 weeks Please obtain CMP/CBC in one week Please ensure follow-up with general surgery, interventional radiology, nephrology/dialysis center and cardiology.  Admitted From:  Home  Disposition: Skilled nursing facility   Discharge Condition: fair  CODE STATUS:   Code Status: Full Code   Diet recommendation:  Diet Order             Diet - low sodium heart healthy           Diet Carb Modified           Diet regular Room service appropriate? Yes with Assist; Fluid consistency: Thin; Fluid restriction: 1200 mL Fluid  Diet effective now                    Brief Summary: Patient is a 77 y.o.  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    Note-this patient just moved from [place] to [place] to be with family, and was brought to the ED straight from the airport when she had RUQ pain.   Significant events: 4/20>> admit to [facility] 4/23>> HIDA scan positive 4/24>> surgery reevaluation-cholecystostomy drain recommended   Significant studies: 4/20>> CXR: No acute cardiopulmonary disease. 4/20>> CT abdomen/pelvis: Dilated gallbladder-with stones-including a stone which may be impacted in the neck of the gallbladder.  IVC filter in place. 4/20>> RUQ ultrasound: Cholelithiasis with positive Murphy sign. 4/23>> HIDA scan: Nonvisualization of gallbladder consistent with cystic duct obstruction. 4/27>> echo: EF 40-45%, regional wall motion abnormalities.   Significant microbiology data: None   Procedures: 4/24>>  cholecystostomy drain placement by IR   Consults: General Surgery IR Vascular surgery.  Brief Hospital Course: Acute calculus cholecystitis Initial imaging studies were equivocal-however HIDA scan in 4/23 wass +ve Reevaluated general surgery on 4/24-and underwent cholecystostomy tube placement Completed Rocephin/Flagyl x 1 week on 4/27 Tolerated regular diet-tolerating resumption of tube feeds.  CCS recommending outpatient follow-up with Dr. Festus Barren weeks IR following   ESRD on HD [facility] Nephrology followed closely. Arrangements for outpatient HD made per social worker. Per vascular surgery-no plans to place permanent HD access this admission.  Please ensure outpatient follow-up with vascular surgery.   Normocytic anemia Secondary to ESRD Iron/Aranesp defer to nephrology service   DVT Per CT-IVC filter in place Initially on IV heparin-since no plans to place permanent access-has been switched back to Eliquis 4/28.  For prior chart review-DVT had previous TDC site-this past February.  Plan anticoagulation x 3 months.   History of CVA with left residual hemiparesis Dysphagia with PEG tube in place Failure to thrive syndrome Discussed with patient's daughter-[name] on 4/25-at baseline patient is bedbound to wheelchair-bound.  She gets nocturnal feeds via PEG tube.    PT/OT/palliative care/nutrition services all following.  SNF planned.   Chronic HFrEF Euvolemic Volume removal HD Has had issues with transient hypotension-especially on dialysis days-discontinued losartan-continue low-dose Coreg.    History of CAD Per daughter-patient needs to follow-up with cardiology in the outpatient setting Echo with slightly reduced EF and numerous wall motion abnormalities Currently without any anginal symptoms Will need to establish with cardiology in the  outpatient setting.   HTN BP stable-soft on days of dialysis-allow permissive hypertension-requires IV fluid boluses/for  hypotension during days of dialysis. Continue Coreg No longer on losartan.   DM-2 (A1c 7.3 on 4/21) CBGs stable-continue Semglee 16 units daily, SSI.  Nutrition Status: Nutrition Problem: Increased nutrient needs Etiology: acute illness Signs/Symptoms: estimated needs Interventions: MVI, Tube feeding   BMI: Estimated body mass index is 27.52 kg/m as calculated from the following:   Height as of this encounter: 4\' 11"  (1.499 m).   Weight as of this encounter: 61.8 kg  Discharge Diagnoses:  Principal Problem:   Epigastric pain Active Problems:   End-stage renal disease on hemodialysis (HCC)   Failure to thrive in adult   DM2 (diabetes mellitus, type 2) (HCC)   Essential hypertension   History of DVT (deep vein thrombosis)   Discharge Instructions:  Activity:  As tolerated with Full fall precautions use walker/cane & assistance as needed  Discharge Instructions     Diet - low sodium heart healthy   Complete by: As directed    Diet Carb Modified   Complete by: As directed    Discharge instructions   Complete by: As directed    Follow with Primary MD   in 1-2 weeks  Please follow-up with cardiology, nephrology and vascular surgery.  Please get a complete blood count and chemistry panel checked by your Primary MD at your next visit, and again as instructed by your Primary MD.  Get Medicines reviewed and adjusted: Please take all your medications with you for your next visit with your Primary MD  Laboratory/radiological data: Please request your Primary MD to go over all hospital tests and procedure/radiological results at the follow up, please ask your Primary MD to get all Hospital records sent to his/her office.  In some cases, they will be blood work, cultures and biopsy results pending at the time of your discharge. Please request that your primary care M.D. follows up on these results.  Also Note the following: If you experience worsening of your admission  symptoms, develop shortness of breath, life threatening emergency, suicidal or homicidal thoughts you must seek medical attention immediately by calling 911 or calling your MD immediately  if symptoms less severe.  You must read complete instructions/literature along with all the possible adverse reactions/side effects for all the Medicines you take and that have been prescribed to you. Take any new Medicines after you have completely understood and accpet all the possible adverse reactions/side effects.   Do not drive when taking Pain medications or sleeping medications (Benzodaizepines)  Do not take more than prescribed Pain, Sleep and Anxiety Medications. It is not advisable to combine anxiety,sleep and pain medications without talking with your primary care practitioner  Special Instructions: If you have smoked or chewed Tobacco  in the last 2 yrs please stop smoking, stop any regular Alcohol  and or any Recreational drug use.  Wear Seat belts while driving.  Please note: You were cared for by a hospitalist during your hospital stay. Once you are discharged, your primary care physician will handle any further medical issues. Please note that NO REFILLS for any discharge medications will be authorized once you are discharged, as it is imperative that you return to your primary care physician (or establish a relationship with a primary care physician if you do not have one) for your post hospital discharge needs so that they can reassess your need for medications and monitor your lab values.   Increase activity  slowly   Complete by: As directed    No wound care   Complete by: As directed       Allergies as of 12/25/2022   No Known Allergies      Medication List     STOP taking these medications    amLODipine 10 MG tablet Commonly known as: NORVASC   cholecalciferol 1000 units tablet Commonly known as: VITAMIN D   Cod Liver Oil 1000 MG Caps   insulin lispro 100 UNIT/ML  injection Commonly known as: HUMALOG   losartan-hydrochlorothiazide 100-25 MG tablet Commonly known as: HYZAAR   traMADol 50 MG tablet Commonly known as: ULTRAM       TAKE these medications    acetaminophen 325 MG tablet Commonly known as: TYLENOL Take 2 tablets (650 mg total) by mouth every 6 (six) hours as needed for mild pain (or Fever >/= 101).   apixaban 5 MG Tabs tablet Commonly known as: ELIQUIS Take 1 tablet (5 mg total) by mouth 2 (two) times daily.   bisacodyl 5 MG EC tablet Commonly known as: DULCOLAX Take 5 mg by mouth daily as needed for moderate constipation.   carvedilol 3.125 MG tablet Commonly known as: COREG Take 1 tablet (3.125 mg total) by mouth 2 (two) times daily with a meal. What changed:  medication strength how much to take   feeding supplement (OSMOLITE 1.5 CAL) Liqd Place 237 mLs into feeding tube 3 (three) times daily with meals.   feeding supplement (OSMOLITE 1.5 CAL) Liqd Place 480 mLs into feeding tube daily. Osmolite 1.5 at 40 mL/hr x 12 hours-start at 6 pm   insulin aspart 100 UNIT/ML injection Commonly known as: novoLOG 0-9 Units, Subcutaneous, 3 times daily with meals, CBG < 70: Implement Hypoglycemia measures CBG 70 - 120: 0 units CBG 121 - 150: 1 unit CBG 151 - 200: 2 units CBG 201 - 250: 3 units CBG 251 - 300: 5 units CBG 301 - 350: 7 units CBG 351 - 400: 9 units CBG > 400: call MD   insulin glargine 100 UNIT/ML injection Commonly known as: LANTUS Inject 0.16 mLs (16 Units total) into the skin at bedtime. What changed: how much to take   multivitamin Tabs tablet Take 1 tablet by mouth at bedtime.        Contact information for follow-up providers     Andria Meuse, MD. Schedule an appointment as soon as possible for a visit in 5 week(s).   Specialties: General Surgery, Colon and Rectal Surgery Why: for follow up of recent gallbladder drain. Contact information: [address] [place]  [address] [phone]         Gilmer Mor, DO Follow up.   Specialties: Interventional Radiology, Radiology Why: pt will hear from IR OP Scheduling for appt 8 weeks with Dr Loreta Ave; call [phone] if any questions Contact information: [address] [place] [address] [phone]         Center, [facility]. Go on 12/28/2022.   Why: Schedule is Monday/Wednesday/Friday. Please arrive at 11:30 for 11:45 chair time. Contact information: [address] [place] [address] [phone]         [facility] [address] Office. Schedule an appointment as soon as possible for a visit in 1 week(s).   Specialty: Cardiology Contact information: [address], [address] [place] [address] [phone]             Contact information for  after-discharge care     Destination     [facility] .   Service: Skilled Nursing Contact information: [address] [place] [address] [phone]                    No Known Allergies   Other Procedures/Studies: VAS [place] UPPER EXT VEIN MAPPING (PRE-OP AVF)  Result Date: 12/19/2022 UPPER EXTREMITY VEIN MAPPING Patient Name:  CHRYSTINA PARGA  Date of Exam:   12/19/2022 Medical Rec #: [id number]        Accession #:    [id number] Date of Birth: [birth year]        Patient Gender: F Patient Age:   17 years Exam Location:  [facility] Procedure:      VAS [place] UPPER EXT VEIN MAPPING (PRE-OP AVF) Referring Phys: Graceann Congress --------------------------------------------------------------------------------  Indications: Pre-access. Limitations: Multiple IVs and bandages in the left arm. Patient would not extend              left arm Comparison Study: No prior study on file Performing Technologist: Sherren Kerns RVS  Examination Guidelines: A complete evaluation includes B-mode imaging, spectral Doppler, color  Doppler, and power Doppler as needed of all accessible portions of each vessel. Bilateral testing is considered an integral part of a complete examination. Limited examinations for reoccurring indications may be performed as noted. +-----------------+-------------+----------+---------+ Right Cephalic   Diameter (cm)Depth (cm)Findings  +-----------------+-------------+----------+---------+ Prox upper arm       0.24        0.83             +-----------------+-------------+----------+---------+ Mid upper arm        0.15        0.92             +-----------------+-------------+----------+---------+ Dist upper arm       0.23        0.44             +-----------------+-------------+----------+---------+ Antecubital fossa    0.26        0.26             +-----------------+-------------+----------+---------+ Prox forearm         0.16        0.50   branching +-----------------+-------------+----------+---------+ Mid forearm          0.16        0.21             +-----------------+-------------+----------+---------+ Dist forearm         0.16        0.16             +-----------------+-------------+----------+---------+ +-----------------+-------------+----------+--------+ Right Basilic    Diameter (cm)Depth (cm)Findings +-----------------+-------------+----------+--------+ Mid upper arm        0.37        0.24    origin  +-----------------+-------------+----------+--------+ Dist upper arm       0.39        2.02            +-----------------+-------------+----------+--------+ Antecubital fossa    0.35        1.83            +-----------------+-------------+----------+--------+ Prox forearm         0.33        0.97            +-----------------+-------------+----------+--------+ Mid forearm          0.19  0.74            +-----------------+-------------+----------+--------+ Distal forearm       0.16        0.68             +-----------------+-------------+----------+--------+ +-----------------+-------------+----------+------------------------------+ Left Cephalic    Diameter (cm)Depth (cm)           Findings            +-----------------+-------------+----------+------------------------------+ Prox upper arm       0.31        1.06                                  +-----------------+-------------+----------+------------------------------+ Mid upper arm        0.32        0.83                                  +-----------------+-------------+----------+------------------------------+ Dist upper arm                          not visualized and IV/bandages +-----------------+-------------+----------+------------------------------+ Antecubital fossa                       not visualized and IV/bandages +-----------------+-------------+----------+------------------------------+ Prox forearm                            not visualized and IV/bandages +-----------------+-------------+----------+------------------------------+ Mid forearm                             not visualized and IV/bandages +-----------------+-------------+----------+------------------------------+ Dist forearm                            not visualized and IV/bandages +-----------------+-------------+----------+------------------------------+ Wrist                                            IV/bandages           +-----------------+-------------+----------+------------------------------+ +-----------------+-------------+----------+------------------------------+ Left Basilic     Diameter (cm)Depth (cm)           Findings            +-----------------+-------------+----------+------------------------------+ Prox upper arm                          not visualized and IV/bandages +-----------------+-------------+----------+------------------------------+ Mid upper arm                           not visualized and  IV/bandages +-----------------+-------------+----------+------------------------------+ Dist upper arm                          not visualized and IV/bandages +-----------------+-------------+----------+------------------------------+ Antecubital fossa                       not visualized and IV/bandages +-----------------+-------------+----------+------------------------------+ Prox forearm                            not visualized and IV/bandages +-----------------+-------------+----------+------------------------------+ Mid forearm  not visualized and IV/bandages +-----------------+-------------+----------+------------------------------+ Distal forearm                          not visualized and IV/bandages +-----------------+-------------+----------+------------------------------+ Elbow                                            IV/bandages           +-----------------+-------------+----------+------------------------------+ Wrist                                   not visualized and IV/bandages +-----------------+-------------+----------+------------------------------+ Unable to visualize the basilic and the majority of the cephalic veins secondary to multiple IV's and the patient's refusal to extend arm *See table(s) above for measurements and observations.  Diagnosing physician: Heath Lark Electronically signed by Heath Lark on 12/19/2022 at 1:34:40 PM.    Final    ECHOCARDIOGRAM COMPLETE  Result Date: 12/19/2022    ECHOCARDIOGRAM REPORT   Patient Name:   JADEN BURBRIDGE Date of Exam: 12/19/2022 Medical Rec #:  [id number]       Height:       59.0 in Accession #:    [id number]      Weight:       150.4 lb Date of Birth:  [birth year]       BSA:          1.634 m Patient Age:    77 years        BP:           150/57 mmHg Patient Gender: F               HR:           102 bpm. Exam Location:  Inpatient Procedure: 2D Echo, Cardiac Doppler, Color  Doppler and Intracardiac            Opacification Agent Indications:    dyspnea  History:        Patient has no prior history of Echocardiogram examinations.                 Stroke; Risk Factors:Diabetes and Hypertension.  Sonographer:    Mike Gip Referring Phys: [id number] [place] IMPRESSIONS  1. Left ventricular ejection fraction, by estimation, is 40 to 45%. The left ventricle has mildly decreased function. The left ventricle demonstrates regional wall motion abnormalities (see scoring diagram/findings for description). Indeterminate diastolic filling due to E-A fusion. There is moderate hypokinesis of the left ventricular, basal-mid lateral wall and inferolateral wall.  2. Right ventricular systolic function is normal. The right ventricular size is normal. Tricuspid regurgitation signal is inadequate for assessing PA pressure.  3. The mitral valve is normal in structure. Mild mitral valve regurgitation.  4. The aortic valve is tricuspid. Aortic valve regurgitation is not visualized. No aortic stenosis is present. FINDINGS  Left Ventricle: Left ventricular ejection fraction, by estimation, is 40 to 45%. The left ventricle has mildly decreased function. The left ventricle demonstrates regional wall motion abnormalities. Moderate hypokinesis of the left ventricular, basal-mid lateral wall and inferolateral wall. Definity contrast agent was given IV to delineate the left ventricular endocardial borders. The left ventricular internal cavity size was normal in size. There is no left ventricular hypertrophy. Indeterminate diastolic filling due to E-A fusion.  LV Wall Scoring:  The antero-lateral wall and posterior wall are hypokinetic. Right Ventricle: The right ventricular size is normal. No increase in right ventricular wall thickness. Right ventricular systolic function is normal. Tricuspid regurgitation signal is inadequate for assessing PA pressure. Left Atrium: Left atrial size was normal in size. Right  Atrium: Right atrial size was normal in size. Pericardium: There is no evidence of pericardial effusion. Mitral Valve: The mitral valve is normal in structure. Mild mitral valve regurgitation, with centrally-directed jet. Tricuspid Valve: The tricuspid valve is normal in structure. Tricuspid valve regurgitation is not demonstrated. Aortic Valve: The aortic valve is tricuspid. Aortic valve regurgitation is not visualized. No aortic stenosis is present. Pulmonic Valve: The pulmonic valve was normal in structure. Pulmonic valve regurgitation is not visualized. Aorta: The aortic root and ascending aorta are structurally normal, with no evidence of dilitation. IAS/Shunts: The interatrial septum was not well visualized.  LEFT VENTRICLE PLAX 2D LVIDd:         4.80 cm      Diastology LVIDs:         4.00 cm      LV e' medial:    8.05 cm/s LV PW:         0.70 cm      LV E/e' medial:  14.9 LV IVS:        1.00 cm      LV e' lateral:   6.74 cm/s LVOT diam:     2.00 cm      LV E/e' lateral: 17.8 LV SV:         43 LV SV Index:   26 LVOT Area:     3.14 cm  LV Volumes (MOD) LV vol d, MOD A2C: 133.0 ml LV vol d, MOD A4C: 124.0 ml LV vol s, MOD A2C: 66.3 ml LV vol s, MOD A4C: 74.5 ml LV SV MOD A2C:     66.7 ml LV SV MOD A4C:     124.0 ml LV SV MOD BP:      58.0 ml RIGHT VENTRICLE RV Basal diam:  2.90 cm RV S prime:     6.53 cm/s LEFT ATRIUM             Index LA diam:        3.00 cm 1.84 cm/m LA Vol (A2C):   35.0 ml 21.42 ml/m LA Vol (A4C):   34.0 ml 20.81 ml/m LA Biplane Vol: 34.6 ml 21.18 ml/m  AORTIC VALVE LVOT Vmax:   77.40 cm/s LVOT Vmean:  49.300 cm/s LVOT VTI:    0.136 m  AORTA Ao Root diam: 2.70 cm Ao Asc diam:  2.80 cm MITRAL VALVE MV Area (PHT): 6.27 cm     SHUNTS MV Decel Time: 121 msec     Systemic VTI:  0.14 m MV E velocity: 120.00 cm/s  Systemic Diam: 2.00 cm Rachelle Hora Croitoru MD Electronically signed by Thurmon Fair MD Signature Date/Time: 12/19/2022/11:38:14 AM    Final    IR Perc Cholecystostomy  Result Date:  12/16/2022 INDICATION: 77 year old female presents for cholecystostomy EXAM: CHOLECYSTOSTOMY MEDICATIONS: 2 g Mefoxin; The antibiotic was administered within an appropriate time frame prior to the initiation of the procedure. ANESTHESIA/SEDATION: Moderate (conscious) sedation was employed during this procedure. A total of Versed 0.0 mg and Fentanyl 70 mcg was administered intravenously. Moderate Sedation Time: 10 minutes. The patient's level of consciousness and vital signs were monitored continuously by radiology nursing throughout the procedure under my direct supervision. FLUOROSCOPY TIME:  Fluoroscopy Time: 0  minutes 6 seconds (4 mGy). COMPLICATIONS: None PROCEDURE: Informed written consent was obtained from the patient and the patient's family after a thorough discussion of the procedural risks, benefits and alternatives. All questions were addressed. Maximal Sterile Barrier Technique was utilized including caps, mask, sterile gowns, sterile gloves, sterile drape, hand hygiene and skin antiseptic. A timeout was performed prior to the initiation of the procedure. Ultrasound survey of the right upper quadrant was performed for planning purposes. Once the patient is prepped and draped in the usual sterile fashion, the skin and subcutaneous tissues overlying the gallbladder were generously infiltrated 1% lidocaine for local anesthesia. A coaxial needle was advanced under ultrasound guidance through the skin subcutaneous tissues and a small segment of liver into the gallbladder lumen. With removal of the stylet, spontaneous dark bile drainage occurred. Using modified Seldinger technique, a 10 French drain was placed into the gallbladder fossa, with aspiration of the sample for the lab. Contrast injection confirmed position of the tube within the gallbladder lumen. Drainage catheter was attached to gravity drain with a suture retention placed. Patient tolerated the procedure well and remained hemodynamically stable  throughout. No complications were encountered and no significant blood loss encountered. IMPRESSION: Status post image guided percutaneous cholecystostomy Signed, Yvone Neu. Miachel Roux, RPVI [facility] Radiology Electronically Signed   By: Gilmer Mor D.O.   On: 12/16/2022 17:29   NM Hepatobiliary Liver Func  Result Date: 12/15/2022 CLINICAL DATA:  Right upper quadrant abdominal pain. Abnormal ultrasound. EXAM: NUCLEAR MEDICINE HEPATOBILIARY IMAGING TECHNIQUE: Sequential images of the abdomen were obtained out to 60 minutes following intravenous administration of radiopharmaceutical. RADIOPHARMACEUTICALS:  5.4 mCi Tc-65m  Choletec IV COMPARISON:  CT and ultrasound examinations 12/12/2022 FINDINGS: Prompt symmetric uptake in the liver and prompt excretion into the biliary tree. Activity is seen in the small early during the study. No activity was seen in the gallbladder after 1 hour of imaging. Patient received 2.5 mg morphine sulfate IV and additional 1 hour imaging was performed. The gallbladder was not visualized. Findings consistent with cystic duct obstruction. IMPRESSION: Nonvisualization of the gallbladder consistent with cystic duct obstruction. Electronically Signed   By: Rudie Meyer M.D.   On: 12/15/2022 13:21   US Abdomen Limited RUQ (LIVER/GB)  Result Date: 12/12/2022 CLINICAL DATA:  Cholelithiasis. EXAM: ULTRASOUND ABDOMEN LIMITED RIGHT UPPER QUADRANT COMPARISON:  Same day CT abdomen pelvis. FINDINGS: Gallbladder: Gallstones are noted, measuring up to 2.7 cm in diameter. No gallbladder wall thickening visualized. A positive sonographic Eulah Pont sign was noted by sonographer. Common bile duct: Diameter: 4 mm Liver: No focal lesion identified. Within normal limits in parenchymal echogenicity. Portal vein is patent on color Doppler imaging with normal direction of blood flow towards the liver. Other: A 2.0 cm cyst is seen in the upper pole the  right kidney. IMPRESSION: Cholelithiasis with a positive sonographic Murphy sign, but no gallbladder wall thickening or pericholecystic fluid. Findings are suggestive of acute cholecystitis. Electronically Signed   By: Romona Curls M.D.   On: 12/12/2022 19:15   CT ABDOMEN PELVIS WO CONTRAST  Result Date: 12/12/2022 CLINICAL DATA:  Nonlocalized abdominal pain EXAM: CT ABDOMEN AND PELVIS WITHOUT CONTRAST TECHNIQUE: Multidetector CT imaging of the abdomen and pelvis was performed following the standard protocol without IV contrast. RADIATION DOSE REDUCTION: This exam was performed according to the departmental dose-optimization program which includes automated exposure control, adjustment of the mA and/or kV according to patient size and/or use of iterative reconstruction technique. COMPARISON:  None Available. FINDINGS: Lower chest: Heart is slightly enlarged. Large-bore catheter seen along the right side of the heart. Breathing motion at the lung bases. Mild basilar scar or atelectasis. Hepatobiliary: Dilated gallbladder with stones including a stone which may be impacted in the neck of the gallbladder. Please correlate for other signs of acute cholecystitis recommend further evaluation such as ultrasound or HIDA scan. Liver is otherwise grossly preserved on this non IV contrast exam. Pancreas: Unremarkable. No pancreatic ductal dilatation or surrounding inflammatory changes. Spleen: Normal in size without focal abnormality. Adrenals/Urinary Tract: The adrenal glands are preserved. Mild bilateral renal atrophy. No abnormal calcification is seen within either kidney nor along the course of either ureter. Preserved contours of the urinary bladder. There is a cystic lesion along the upper pole of the right kidney measuring Hounsfield unit of less than 0 and diameter of 2.4 cm. Smaller focus more lateral as well in the right kidney measuring also Hounsfield unit of less than 0 and diameter approaching 1.5 cm. Likely  both benign cysts. Specific imaging follow-up. Stomach/Bowel: Large bowel has a normal course and caliber with scattered stool. Few left-sided colonic diverticula. Normal retrocecal appendix. Small bowel is nondilated. G-tube in the stomach. Vascular/Lymphatic: IVC filter in place. Normal caliber aorta with slight atherosclerotic calcifications along the aorta and branch vessels. No specific abnormal lymph node enlargement seen in the abdomen and pelvis. Reproductive: Status post hysterectomy. No adnexal masses. Other: No free air or free fluid. Musculoskeletal: Scattered degenerative changes of the spine and pelvis. Trace retrolisthesis at T11 on T12 and anterolisthesis of L3 on L4. Degenerative changes of the pelvis. IMPRESSION: Dilated gallbladder with stones including a stone which may be impacted in the neck of the gallbladder. Acute cholecystitis is possible. Recommend further workup such as HIDA scan or ultrasound. Scattered colonic diverticula. Normal appendix. No bowel obstruction. IVC filter. Mild bilateral renal atrophy. No obstructing stones. Benign-appearing Bosniak 1 renal cysts. Enlarged heart with a large-bore catheter. Electronically Signed   By: Karen Kays M.D.   On: 12/12/2022 16:17   DG Chest 2 View  Result Date: 12/12/2022 CLINICAL DATA:  Shortness of breath. EXAM: CHEST - 2 VIEW COMPARISON:  None Available. FINDINGS: Heart is enlarged. A right subclavian dialysis catheter is in place. The tip turns medially and may be near the mitral valve. No edema or effusion is present. No focal airspace disease is present. The lung volumes are low. IVC filter is noted. Degenerative changes are present in thoracic spine. Surgical clips are present in the left axilla. IMPRESSION: 1. Cardiomegaly without failure. 2. No acute cardiopulmonary disease. 3. Right subclavian dialysis catheter is in place. Electronically Signed   By: Marin Roberts M.D.   On: 12/12/2022 12:18     TODAY-DAY OF  DISCHARGE:  Subjective:   Samantha [name] today has no headache,no chest abdominal pain,no new weakness tingling or numbness, feels much better wants to go home today.   Objective:   Blood pressure 111/64, pulse 96, temperature 98.2 F (36.8 C), temperature source Oral, resp. rate 18, height 4\' 11"  (1.499 m), weight 61.8 kg, SpO2 98 %. No intake or output data in the 24 hours ending 12/25/22 0919 Filed Weights   12/22/22 0500 12/23/22 0759 12/23/22 1144  Weight: 62.6 kg 61.8 kg 61.8 kg    Exam: Awake Alert, Oriented *3, No new F.N deficits, Normal affect [place].AT,PERRAL Supple Neck,No JVD, No cervical lymphadenopathy appriciated.  Symmetrical Chest wall movement, Good air movement bilaterally, CTAB RRR,No Gallops,Rubs or new Murmurs,  No Parasternal Heave +ve B.Sounds, Abd Soft, Non tender, No organomegaly appriciated, No rebound -guarding or rigidity. No Cyanosis, Clubbing or edema, No new Rash or bruise   PERTINENT RADIOLOGIC STUDIES: No results found.   PERTINENT LAB RESULTS: CBC: Recent Labs    12/23/22 0710 12/24/22 1136  WBC 9.4 7.6  HGB 8.7* 9.4*  HCT 28.1* 29.8*  PLT 186 181   CMET CMP     Component Value Date/Time   NA 134 (L) 12/24/2022 1136   K 4.9 12/24/2022 1136   CL 99 12/24/2022 1136   CO2 23 12/24/2022 1136   GLUCOSE 217 (H) 12/24/2022 1136   BUN 27 (H) 12/24/2022 1136   CREATININE 2.91 (H) 12/24/2022 1136   CALCIUM 9.1 12/24/2022 1136   CALCIUM 8.7 12/15/2022 0124   PROT 6.2 (L) 12/14/2022 0501   ALBUMIN 3.2 (L) 12/24/2022 1136   AST 27 12/14/2022 0501   ALT 23 12/14/2022 0501   ALKPHOS 47 12/14/2022 0501   BILITOT 0.3 12/14/2022 0501   GFRNONAA 16 (L) 12/24/2022 1136    GFR Estimated Creatinine Clearance: 13.1 mL/min (A) (by C-G formula based on SCr of 2.91 mg/dL (H)). No results for input(s): "LIPASE", "AMYLASE" in the last 72 hours. No results for input(s): "CKTOTAL", "CKMB", "CKMBINDEX", "TROPONINI" in the last 72 hours. Invalid  input(s): "POCBNP" No results for input(s): "DDIMER" in the last 72 hours. No results for input(s): "HGBA1C" in the last 72 hours. No results for input(s): "CHOL", "HDL", "LDLCALC", "TRIG", "CHOLHDL", "LDLDIRECT" in the last 72 hours. No results for input(s): "TSH", "T4TOTAL", "T3FREE", "THYROIDAB" in the last 72 hours.  Invalid input(s): "FREET3" No results for input(s): "VITAMINB12", "FOLATE", "FERRITIN", "TIBC", "IRON", "RETICCTPCT" in the last 72 hours. Coags: No results for input(s): "INR" in the last 72 hours.  Invalid input(s): "PT" Microbiology: Recent Results (from the past 240 hour(s))  Aerobic/Anaerobic Culture w Gram Stain (surgical/deep wound)     Status: None   Collection Time: 12/16/22  3:19 PM   Specimen: Gallbladder; Abscess  Result Value Ref Range Status   Specimen Description GALL BLADDER  Final   Special Requests NONE  Final   Gram Stain NO WBC SEEN NO ORGANISMS SEEN   Final   Culture   Final    No growth aerobically or anaerobically. Performed at [facility], [address], [place], [place] [address]    Report Status 12/21/2022 FINAL  Final    FURTHER DISCHARGE INSTRUCTIONS:  Get Medicines reviewed and adjusted: Please take all your medications with you for your next visit with your Primary MD  Laboratory/radiological data: Please request your Primary MD to go over all hospital tests and procedure/radiological results at the follow up, please ask your Primary MD to get all Hospital records sent to his/her office.  In some cases, they will be blood work, cultures and biopsy results pending at the time of your discharge. Please request that your primary care M.D. goes through all the records of your hospital data and follows up on these results.  Also Note the following: If you experience worsening of your admission symptoms, develop shortness of breath, life threatening emergency, suicidal or homicidal thoughts you must seek medical attention  immediately by calling 911 or calling your MD immediately  if symptoms less severe.  You must read complete instructions/literature along with all the possible adverse reactions/side effects for all the Medicines you take and that have been prescribed to you. Take any new Medicines after you have completely understood and  accpet all the possible adverse reactions/side effects.   Do not drive when taking Pain medications or sleeping medications (Benzodaizepines)  Do not take more than prescribed Pain, Sleep and Anxiety Medications. It is not advisable to combine anxiety,sleep and pain medications without talking with your primary care practitioner  Special Instructions: If you have smoked or chewed Tobacco  in the last 2 yrs please stop smoking, stop any regular Alcohol  and or any Recreational drug use.  Wear Seat belts while driving.  Please note: You were cared for by a hospitalist during your hospital stay. Once you are discharged, your primary care physician will handle any further medical issues. Please note that NO REFILLS for any discharge medications will be authorized once you are discharged, as it is imperative that you return to your primary care physician (or establish a relationship with a primary care physician if you do not have one) for your post hospital discharge needs so that they can reassess your need for medications and monitor your lab values.  Total Time spent coordinating discharge including counseling, education and face to face time equals greater than 30 minutes.  SignedJeoffrey Massed 12/25/2022 9:19 AM

## 2022-12-25 NOTE — TOC Transition Note (Signed)
Transition of Care [facility]) - CM/SW Discharge Note   Patient Details  Name: Samantha [name] MRN: [id number] Date of Birth: [birth year]  [facility] CM/SW Contact:  Mearl Latin, LCSW Phone Number: 12/25/2022, 2:31 PM   Clinical Narrative:    Patient will DC to: [facility] Anticipated DC date: 12/25/22 Family notified: Daughter, Garment/textile technologist by: Sharin Mons   Per MD patient ready for DC to [facility]. RN to call report prior to discharge [phone] room 215A). RN, patient, patient's family, and facility notified of DC. Discharge Summary and FL2 sent to facility and CSW confirmed tube feeds and dialysis schedule. DC packet on chart. Ambulance transport requested for patient.   CSW will sign off for now as social work intervention is no longer needed. Please consult [place] again if new needs arise.     Final next level of care: Skilled Nursing Facility Barriers to Discharge: Barriers Resolved   Patient Goals and CMS Choice CMS Medicare.gov Compare Post Acute Care list provided to:: Patient Represenative (must comment) Choice offered to / list presented to : Adult Children  Discharge Placement     Existing PASRR number confirmed : 12/25/22          Patient chooses bed at:  [facility]) Patient to be transferred to facility by: [facility] Name of family member notified: Daughter Patient and family notified of of transfer: 12/25/22  Discharge Plan and Services Additional resources added to the After Visit Summary for   In-house Referral: Clinical Social Work   Post Acute Care Choice: Skilled Nursing Facility                               Social Determinants of Health (SDOH) Interventions SDOH Screenings   Food Insecurity: No Food Insecurity (12/12/2022)  Housing: Low Risk  (12/12/2022)  Transportation Needs: No Transportation Needs (12/12/2022)  Utilities: Not At Risk (12/12/2022)  Tobacco Use: Unknown (12/16/2022)     Readmission Risk  Interventions     No data to display

## 2022-12-28 ENCOUNTER — Telehealth: Payer: Self-pay | Admitting: Vascular Surgery

## 2022-12-28 DIAGNOSIS — N186 End stage renal disease: Secondary | ICD-10-CM | POA: Diagnosis not present

## 2022-12-28 DIAGNOSIS — Z23 Encounter for immunization: Secondary | ICD-10-CM | POA: Diagnosis not present

## 2022-12-28 DIAGNOSIS — D509 Iron deficiency anemia, unspecified: Secondary | ICD-10-CM | POA: Diagnosis not present

## 2022-12-28 DIAGNOSIS — N2581 Secondary hyperparathyroidism of renal origin: Secondary | ICD-10-CM | POA: Diagnosis not present

## 2022-12-28 DIAGNOSIS — D631 Anemia in chronic kidney disease: Secondary | ICD-10-CM | POA: Diagnosis not present

## 2022-12-28 NOTE — Telephone Encounter (Signed)
-----   Message from [name], MD sent at 12/20/2022  9:42 AM EDT ----- Follow up with me in 6 weeks to discuss dialysis access surgery. No studies needed. Thanks. Samantha [name]

## 2022-12-29 DIAGNOSIS — Z992 Dependence on renal dialysis: Secondary | ICD-10-CM | POA: Diagnosis not present

## 2022-12-29 DIAGNOSIS — I69354 Hemiplegia and hemiparesis following cerebral infarction affecting left non-dominant side: Secondary | ICD-10-CM | POA: Diagnosis not present

## 2022-12-29 DIAGNOSIS — N186 End stage renal disease: Secondary | ICD-10-CM | POA: Diagnosis not present

## 2022-12-29 DIAGNOSIS — Z434 Encounter for attention to other artificial openings of digestive tract: Secondary | ICD-10-CM | POA: Diagnosis not present

## 2022-12-29 DIAGNOSIS — R627 Adult failure to thrive: Secondary | ICD-10-CM | POA: Diagnosis not present

## 2022-12-29 DIAGNOSIS — I69391 Dysphagia following cerebral infarction: Secondary | ICD-10-CM | POA: Diagnosis not present

## 2022-12-29 DIAGNOSIS — K810 Acute cholecystitis: Secondary | ICD-10-CM | POA: Diagnosis not present

## 2022-12-29 DIAGNOSIS — Z931 Gastrostomy status: Secondary | ICD-10-CM | POA: Diagnosis not present

## 2022-12-30 DIAGNOSIS — Z992 Dependence on renal dialysis: Secondary | ICD-10-CM | POA: Diagnosis not present

## 2022-12-30 DIAGNOSIS — I639 Cerebral infarction, unspecified: Secondary | ICD-10-CM | POA: Diagnosis not present

## 2022-12-30 DIAGNOSIS — I132 Hypertensive heart and chronic kidney disease with heart failure and with stage 5 chronic kidney disease, or end stage renal disease: Secondary | ICD-10-CM | POA: Diagnosis not present

## 2022-12-30 DIAGNOSIS — I82729 Chronic embolism and thrombosis of deep veins of unspecified upper extremity: Secondary | ICD-10-CM | POA: Diagnosis not present

## 2022-12-30 DIAGNOSIS — N186 End stage renal disease: Secondary | ICD-10-CM | POA: Diagnosis not present

## 2022-12-30 DIAGNOSIS — D509 Iron deficiency anemia, unspecified: Secondary | ICD-10-CM | POA: Diagnosis not present

## 2022-12-30 DIAGNOSIS — I69354 Hemiplegia and hemiparesis following cerebral infarction affecting left non-dominant side: Secondary | ICD-10-CM | POA: Diagnosis not present

## 2022-12-30 DIAGNOSIS — Z23 Encounter for immunization: Secondary | ICD-10-CM | POA: Diagnosis not present

## 2022-12-30 DIAGNOSIS — D631 Anemia in chronic kidney disease: Secondary | ICD-10-CM | POA: Diagnosis not present

## 2022-12-30 DIAGNOSIS — E118 Type 2 diabetes mellitus with unspecified complications: Secondary | ICD-10-CM | POA: Diagnosis not present

## 2022-12-30 DIAGNOSIS — N2581 Secondary hyperparathyroidism of renal origin: Secondary | ICD-10-CM | POA: Diagnosis not present

## 2022-12-30 DIAGNOSIS — I69921 Dysphasia following unspecified cerebrovascular disease: Secondary | ICD-10-CM | POA: Diagnosis not present

## 2022-12-31 DIAGNOSIS — E118 Type 2 diabetes mellitus with unspecified complications: Secondary | ICD-10-CM | POA: Diagnosis not present

## 2022-12-31 DIAGNOSIS — I10 Essential (primary) hypertension: Secondary | ICD-10-CM | POA: Diagnosis not present

## 2022-12-31 DIAGNOSIS — W19XXXD Unspecified fall, subsequent encounter: Secondary | ICD-10-CM | POA: Diagnosis not present

## 2022-12-31 DIAGNOSIS — K5900 Constipation, unspecified: Secondary | ICD-10-CM | POA: Diagnosis not present

## 2022-12-31 DIAGNOSIS — I69354 Hemiplegia and hemiparesis following cerebral infarction affecting left non-dominant side: Secondary | ICD-10-CM | POA: Diagnosis not present

## 2023-01-01 DIAGNOSIS — D631 Anemia in chronic kidney disease: Secondary | ICD-10-CM | POA: Diagnosis not present

## 2023-01-01 DIAGNOSIS — Z23 Encounter for immunization: Secondary | ICD-10-CM | POA: Diagnosis not present

## 2023-01-01 DIAGNOSIS — D509 Iron deficiency anemia, unspecified: Secondary | ICD-10-CM | POA: Diagnosis not present

## 2023-01-01 DIAGNOSIS — N186 End stage renal disease: Secondary | ICD-10-CM | POA: Diagnosis not present

## 2023-01-01 DIAGNOSIS — N2581 Secondary hyperparathyroidism of renal origin: Secondary | ICD-10-CM | POA: Diagnosis not present

## 2023-01-04 DIAGNOSIS — N2581 Secondary hyperparathyroidism of renal origin: Secondary | ICD-10-CM | POA: Diagnosis not present

## 2023-01-04 DIAGNOSIS — D631 Anemia in chronic kidney disease: Secondary | ICD-10-CM | POA: Diagnosis not present

## 2023-01-04 DIAGNOSIS — Z23 Encounter for immunization: Secondary | ICD-10-CM | POA: Diagnosis not present

## 2023-01-04 DIAGNOSIS — D509 Iron deficiency anemia, unspecified: Secondary | ICD-10-CM | POA: Diagnosis not present

## 2023-01-04 DIAGNOSIS — N186 End stage renal disease: Secondary | ICD-10-CM | POA: Diagnosis not present

## 2023-01-05 DIAGNOSIS — Z931 Gastrostomy status: Secondary | ICD-10-CM | POA: Diagnosis not present

## 2023-01-05 DIAGNOSIS — I69354 Hemiplegia and hemiparesis following cerebral infarction affecting left non-dominant side: Secondary | ICD-10-CM | POA: Diagnosis not present

## 2023-01-05 DIAGNOSIS — Z992 Dependence on renal dialysis: Secondary | ICD-10-CM | POA: Diagnosis not present

## 2023-01-05 DIAGNOSIS — I69391 Dysphagia following cerebral infarction: Secondary | ICD-10-CM | POA: Diagnosis not present

## 2023-01-05 DIAGNOSIS — K810 Acute cholecystitis: Secondary | ICD-10-CM | POA: Diagnosis not present

## 2023-01-05 DIAGNOSIS — I2510 Atherosclerotic heart disease of native coronary artery without angina pectoris: Secondary | ICD-10-CM | POA: Diagnosis not present

## 2023-01-05 DIAGNOSIS — N186 End stage renal disease: Secondary | ICD-10-CM | POA: Diagnosis not present

## 2023-01-06 DIAGNOSIS — Z23 Encounter for immunization: Secondary | ICD-10-CM | POA: Diagnosis not present

## 2023-01-06 DIAGNOSIS — N186 End stage renal disease: Secondary | ICD-10-CM | POA: Diagnosis not present

## 2023-01-06 DIAGNOSIS — D631 Anemia in chronic kidney disease: Secondary | ICD-10-CM | POA: Diagnosis not present

## 2023-01-06 DIAGNOSIS — D509 Iron deficiency anemia, unspecified: Secondary | ICD-10-CM | POA: Diagnosis not present

## 2023-01-06 DIAGNOSIS — N2581 Secondary hyperparathyroidism of renal origin: Secondary | ICD-10-CM | POA: Diagnosis not present

## 2023-01-07 DIAGNOSIS — R21 Rash and other nonspecific skin eruption: Secondary | ICD-10-CM | POA: Diagnosis not present

## 2023-01-07 DIAGNOSIS — D509 Iron deficiency anemia, unspecified: Secondary | ICD-10-CM | POA: Diagnosis not present

## 2023-01-07 DIAGNOSIS — I10 Essential (primary) hypertension: Secondary | ICD-10-CM | POA: Diagnosis not present

## 2023-01-07 DIAGNOSIS — R5381 Other malaise: Secondary | ICD-10-CM | POA: Diagnosis not present

## 2023-01-07 DIAGNOSIS — N186 End stage renal disease: Secondary | ICD-10-CM | POA: Diagnosis not present

## 2023-01-07 DIAGNOSIS — R627 Adult failure to thrive: Secondary | ICD-10-CM | POA: Diagnosis not present

## 2023-01-07 DIAGNOSIS — Z992 Dependence on renal dialysis: Secondary | ICD-10-CM | POA: Diagnosis not present

## 2023-01-07 DIAGNOSIS — I69354 Hemiplegia and hemiparesis following cerebral infarction affecting left non-dominant side: Secondary | ICD-10-CM | POA: Diagnosis not present

## 2023-01-08 DIAGNOSIS — F0150 Vascular dementia without behavioral disturbance: Secondary | ICD-10-CM | POA: Diagnosis not present

## 2023-01-08 DIAGNOSIS — N186 End stage renal disease: Secondary | ICD-10-CM | POA: Diagnosis not present

## 2023-01-08 DIAGNOSIS — N2581 Secondary hyperparathyroidism of renal origin: Secondary | ICD-10-CM | POA: Diagnosis not present

## 2023-01-08 DIAGNOSIS — Z23 Encounter for immunization: Secondary | ICD-10-CM | POA: Diagnosis not present

## 2023-01-08 DIAGNOSIS — E569 Vitamin deficiency, unspecified: Secondary | ICD-10-CM | POA: Diagnosis not present

## 2023-01-08 DIAGNOSIS — R5381 Other malaise: Secondary | ICD-10-CM | POA: Diagnosis not present

## 2023-01-08 DIAGNOSIS — D631 Anemia in chronic kidney disease: Secondary | ICD-10-CM | POA: Diagnosis not present

## 2023-01-08 DIAGNOSIS — W19XXXD Unspecified fall, subsequent encounter: Secondary | ICD-10-CM | POA: Diagnosis not present

## 2023-01-08 DIAGNOSIS — D509 Iron deficiency anemia, unspecified: Secondary | ICD-10-CM | POA: Diagnosis not present

## 2023-01-11 ENCOUNTER — Other Ambulatory Visit (HOSPITAL_COMMUNITY): Payer: Self-pay | Admitting: Radiology

## 2023-01-11 DIAGNOSIS — Z23 Encounter for immunization: Secondary | ICD-10-CM | POA: Diagnosis not present

## 2023-01-11 DIAGNOSIS — N2581 Secondary hyperparathyroidism of renal origin: Secondary | ICD-10-CM | POA: Diagnosis not present

## 2023-01-11 DIAGNOSIS — N186 End stage renal disease: Secondary | ICD-10-CM | POA: Diagnosis not present

## 2023-01-11 DIAGNOSIS — K810 Acute cholecystitis: Secondary | ICD-10-CM

## 2023-01-11 DIAGNOSIS — D631 Anemia in chronic kidney disease: Secondary | ICD-10-CM | POA: Diagnosis not present

## 2023-01-11 DIAGNOSIS — D509 Iron deficiency anemia, unspecified: Secondary | ICD-10-CM | POA: Diagnosis not present

## 2023-01-13 DIAGNOSIS — D509 Iron deficiency anemia, unspecified: Secondary | ICD-10-CM | POA: Diagnosis not present

## 2023-01-13 DIAGNOSIS — Z23 Encounter for immunization: Secondary | ICD-10-CM | POA: Diagnosis not present

## 2023-01-13 DIAGNOSIS — N186 End stage renal disease: Secondary | ICD-10-CM | POA: Diagnosis not present

## 2023-01-13 DIAGNOSIS — D631 Anemia in chronic kidney disease: Secondary | ICD-10-CM | POA: Diagnosis not present

## 2023-01-13 DIAGNOSIS — N2581 Secondary hyperparathyroidism of renal origin: Secondary | ICD-10-CM | POA: Diagnosis not present

## 2023-01-15 DIAGNOSIS — Z862 Personal history of diseases of the blood and blood-forming organs and certain disorders involving the immune mechanism: Secondary | ICD-10-CM | POA: Diagnosis not present

## 2023-01-15 DIAGNOSIS — N2581 Secondary hyperparathyroidism of renal origin: Secondary | ICD-10-CM | POA: Diagnosis not present

## 2023-01-15 DIAGNOSIS — Z23 Encounter for immunization: Secondary | ICD-10-CM | POA: Diagnosis not present

## 2023-01-15 DIAGNOSIS — E1122 Type 2 diabetes mellitus with diabetic chronic kidney disease: Secondary | ICD-10-CM | POA: Diagnosis not present

## 2023-01-15 DIAGNOSIS — K5900 Constipation, unspecified: Secondary | ICD-10-CM | POA: Diagnosis not present

## 2023-01-15 DIAGNOSIS — D631 Anemia in chronic kidney disease: Secondary | ICD-10-CM | POA: Diagnosis not present

## 2023-01-15 DIAGNOSIS — K219 Gastro-esophageal reflux disease without esophagitis: Secondary | ICD-10-CM | POA: Diagnosis not present

## 2023-01-15 DIAGNOSIS — E569 Vitamin deficiency, unspecified: Secondary | ICD-10-CM | POA: Diagnosis not present

## 2023-01-15 DIAGNOSIS — N186 End stage renal disease: Secondary | ICD-10-CM | POA: Diagnosis not present

## 2023-01-15 DIAGNOSIS — D509 Iron deficiency anemia, unspecified: Secondary | ICD-10-CM | POA: Diagnosis not present

## 2023-01-18 DIAGNOSIS — N2581 Secondary hyperparathyroidism of renal origin: Secondary | ICD-10-CM | POA: Diagnosis not present

## 2023-01-18 DIAGNOSIS — Z23 Encounter for immunization: Secondary | ICD-10-CM | POA: Diagnosis not present

## 2023-01-18 DIAGNOSIS — D631 Anemia in chronic kidney disease: Secondary | ICD-10-CM | POA: Diagnosis not present

## 2023-01-18 DIAGNOSIS — N186 End stage renal disease: Secondary | ICD-10-CM | POA: Diagnosis not present

## 2023-01-18 DIAGNOSIS — D509 Iron deficiency anemia, unspecified: Secondary | ICD-10-CM | POA: Diagnosis not present

## 2023-01-19 DIAGNOSIS — N186 End stage renal disease: Secondary | ICD-10-CM | POA: Diagnosis not present

## 2023-01-19 DIAGNOSIS — Z992 Dependence on renal dialysis: Secondary | ICD-10-CM | POA: Diagnosis not present

## 2023-01-19 DIAGNOSIS — E119 Type 2 diabetes mellitus without complications: Secondary | ICD-10-CM | POA: Diagnosis not present

## 2023-01-19 DIAGNOSIS — I69391 Dysphagia following cerebral infarction: Secondary | ICD-10-CM | POA: Diagnosis not present

## 2023-01-19 DIAGNOSIS — K810 Acute cholecystitis: Secondary | ICD-10-CM | POA: Diagnosis not present

## 2023-01-19 DIAGNOSIS — I10 Essential (primary) hypertension: Secondary | ICD-10-CM | POA: Diagnosis not present

## 2023-01-20 DIAGNOSIS — Z23 Encounter for immunization: Secondary | ICD-10-CM | POA: Diagnosis not present

## 2023-01-20 DIAGNOSIS — N2581 Secondary hyperparathyroidism of renal origin: Secondary | ICD-10-CM | POA: Diagnosis not present

## 2023-01-20 DIAGNOSIS — N186 End stage renal disease: Secondary | ICD-10-CM | POA: Diagnosis not present

## 2023-01-20 DIAGNOSIS — D509 Iron deficiency anemia, unspecified: Secondary | ICD-10-CM | POA: Diagnosis not present

## 2023-01-20 DIAGNOSIS — D631 Anemia in chronic kidney disease: Secondary | ICD-10-CM | POA: Diagnosis not present

## 2023-01-22 DIAGNOSIS — R079 Chest pain, unspecified: Secondary | ICD-10-CM | POA: Diagnosis not present

## 2023-01-22 DIAGNOSIS — D631 Anemia in chronic kidney disease: Secondary | ICD-10-CM | POA: Diagnosis not present

## 2023-01-22 DIAGNOSIS — D509 Iron deficiency anemia, unspecified: Secondary | ICD-10-CM | POA: Diagnosis not present

## 2023-01-22 DIAGNOSIS — Z23 Encounter for immunization: Secondary | ICD-10-CM | POA: Diagnosis not present

## 2023-01-22 DIAGNOSIS — R109 Unspecified abdominal pain: Secondary | ICD-10-CM | POA: Diagnosis not present

## 2023-01-22 DIAGNOSIS — N2581 Secondary hyperparathyroidism of renal origin: Secondary | ICD-10-CM | POA: Diagnosis not present

## 2023-01-22 DIAGNOSIS — N186 End stage renal disease: Secondary | ICD-10-CM | POA: Diagnosis not present

## 2023-01-22 DIAGNOSIS — Z992 Dependence on renal dialysis: Secondary | ICD-10-CM | POA: Diagnosis not present

## 2023-01-25 DIAGNOSIS — D509 Iron deficiency anemia, unspecified: Secondary | ICD-10-CM | POA: Diagnosis not present

## 2023-01-25 DIAGNOSIS — N186 End stage renal disease: Secondary | ICD-10-CM | POA: Diagnosis not present

## 2023-01-25 DIAGNOSIS — D631 Anemia in chronic kidney disease: Secondary | ICD-10-CM | POA: Diagnosis not present

## 2023-01-25 DIAGNOSIS — N2581 Secondary hyperparathyroidism of renal origin: Secondary | ICD-10-CM | POA: Diagnosis not present

## 2023-01-26 DIAGNOSIS — F430 Acute stress reaction: Secondary | ICD-10-CM | POA: Diagnosis not present

## 2023-01-26 DIAGNOSIS — F329 Major depressive disorder, single episode, unspecified: Secondary | ICD-10-CM | POA: Diagnosis not present

## 2023-01-26 DIAGNOSIS — E1122 Type 2 diabetes mellitus with diabetic chronic kidney disease: Secondary | ICD-10-CM | POA: Diagnosis not present

## 2023-01-26 DIAGNOSIS — Z992 Dependence on renal dialysis: Secondary | ICD-10-CM | POA: Diagnosis not present

## 2023-01-26 DIAGNOSIS — I5022 Chronic systolic (congestive) heart failure: Secondary | ICD-10-CM | POA: Diagnosis not present

## 2023-01-26 DIAGNOSIS — F411 Generalized anxiety disorder: Secondary | ICD-10-CM | POA: Diagnosis not present

## 2023-01-27 ENCOUNTER — Ambulatory Visit: Payer: Medicare Other | Admitting: Internal Medicine

## 2023-01-27 DIAGNOSIS — R1031 Right lower quadrant pain: Secondary | ICD-10-CM | POA: Diagnosis not present

## 2023-01-27 DIAGNOSIS — F329 Major depressive disorder, single episode, unspecified: Secondary | ICD-10-CM | POA: Diagnosis not present

## 2023-01-27 DIAGNOSIS — E569 Vitamin deficiency, unspecified: Secondary | ICD-10-CM | POA: Diagnosis not present

## 2023-01-27 DIAGNOSIS — R21 Rash and other nonspecific skin eruption: Secondary | ICD-10-CM | POA: Diagnosis not present

## 2023-01-27 DIAGNOSIS — N186 End stage renal disease: Secondary | ICD-10-CM | POA: Diagnosis not present

## 2023-01-27 DIAGNOSIS — K5900 Constipation, unspecified: Secondary | ICD-10-CM | POA: Diagnosis not present

## 2023-01-27 DIAGNOSIS — E1122 Type 2 diabetes mellitus with diabetic chronic kidney disease: Secondary | ICD-10-CM | POA: Diagnosis not present

## 2023-01-27 DIAGNOSIS — D509 Iron deficiency anemia, unspecified: Secondary | ICD-10-CM | POA: Diagnosis not present

## 2023-01-27 DIAGNOSIS — N2581 Secondary hyperparathyroidism of renal origin: Secondary | ICD-10-CM | POA: Diagnosis not present

## 2023-01-27 DIAGNOSIS — D631 Anemia in chronic kidney disease: Secondary | ICD-10-CM | POA: Diagnosis not present

## 2023-01-29 DIAGNOSIS — D509 Iron deficiency anemia, unspecified: Secondary | ICD-10-CM | POA: Diagnosis not present

## 2023-01-29 DIAGNOSIS — N2581 Secondary hyperparathyroidism of renal origin: Secondary | ICD-10-CM | POA: Diagnosis not present

## 2023-01-29 DIAGNOSIS — N186 End stage renal disease: Secondary | ICD-10-CM | POA: Diagnosis not present

## 2023-01-29 DIAGNOSIS — D631 Anemia in chronic kidney disease: Secondary | ICD-10-CM | POA: Diagnosis not present

## 2023-02-01 DIAGNOSIS — N186 End stage renal disease: Secondary | ICD-10-CM | POA: Diagnosis not present

## 2023-02-01 DIAGNOSIS — D509 Iron deficiency anemia, unspecified: Secondary | ICD-10-CM | POA: Diagnosis not present

## 2023-02-01 DIAGNOSIS — N2581 Secondary hyperparathyroidism of renal origin: Secondary | ICD-10-CM | POA: Diagnosis not present

## 2023-02-01 DIAGNOSIS — D631 Anemia in chronic kidney disease: Secondary | ICD-10-CM | POA: Diagnosis not present

## 2023-02-02 DIAGNOSIS — Z992 Dependence on renal dialysis: Secondary | ICD-10-CM | POA: Diagnosis not present

## 2023-02-02 DIAGNOSIS — N186 End stage renal disease: Secondary | ICD-10-CM | POA: Diagnosis not present

## 2023-02-02 DIAGNOSIS — F430 Acute stress reaction: Secondary | ICD-10-CM | POA: Diagnosis not present

## 2023-02-02 DIAGNOSIS — E119 Type 2 diabetes mellitus without complications: Secondary | ICD-10-CM | POA: Diagnosis not present

## 2023-02-02 DIAGNOSIS — I5022 Chronic systolic (congestive) heart failure: Secondary | ICD-10-CM | POA: Diagnosis not present

## 2023-02-02 DIAGNOSIS — F329 Major depressive disorder, single episode, unspecified: Secondary | ICD-10-CM | POA: Diagnosis not present

## 2023-02-03 DIAGNOSIS — D631 Anemia in chronic kidney disease: Secondary | ICD-10-CM | POA: Diagnosis not present

## 2023-02-03 DIAGNOSIS — N2581 Secondary hyperparathyroidism of renal origin: Secondary | ICD-10-CM | POA: Diagnosis not present

## 2023-02-03 DIAGNOSIS — N186 End stage renal disease: Secondary | ICD-10-CM | POA: Diagnosis not present

## 2023-02-03 DIAGNOSIS — D509 Iron deficiency anemia, unspecified: Secondary | ICD-10-CM | POA: Diagnosis not present

## 2023-02-04 DIAGNOSIS — F329 Major depressive disorder, single episode, unspecified: Secondary | ICD-10-CM | POA: Diagnosis not present

## 2023-02-04 DIAGNOSIS — E569 Vitamin deficiency, unspecified: Secondary | ICD-10-CM | POA: Diagnosis not present

## 2023-02-04 DIAGNOSIS — Z758 Other problems related to medical facilities and other health care: Secondary | ICD-10-CM | POA: Diagnosis not present

## 2023-02-04 DIAGNOSIS — R5381 Other malaise: Secondary | ICD-10-CM | POA: Diagnosis not present

## 2023-02-04 DIAGNOSIS — E118 Type 2 diabetes mellitus with unspecified complications: Secondary | ICD-10-CM | POA: Diagnosis not present

## 2023-02-05 DIAGNOSIS — N2581 Secondary hyperparathyroidism of renal origin: Secondary | ICD-10-CM | POA: Diagnosis not present

## 2023-02-05 DIAGNOSIS — N186 End stage renal disease: Secondary | ICD-10-CM | POA: Diagnosis not present

## 2023-02-05 DIAGNOSIS — D509 Iron deficiency anemia, unspecified: Secondary | ICD-10-CM | POA: Diagnosis not present

## 2023-02-05 DIAGNOSIS — D631 Anemia in chronic kidney disease: Secondary | ICD-10-CM | POA: Diagnosis not present

## 2023-02-08 DIAGNOSIS — D631 Anemia in chronic kidney disease: Secondary | ICD-10-CM | POA: Diagnosis not present

## 2023-02-08 DIAGNOSIS — N2581 Secondary hyperparathyroidism of renal origin: Secondary | ICD-10-CM | POA: Diagnosis not present

## 2023-02-08 DIAGNOSIS — N186 End stage renal disease: Secondary | ICD-10-CM | POA: Diagnosis not present

## 2023-02-08 DIAGNOSIS — D509 Iron deficiency anemia, unspecified: Secondary | ICD-10-CM | POA: Diagnosis not present

## 2023-02-09 DIAGNOSIS — I5022 Chronic systolic (congestive) heart failure: Secondary | ICD-10-CM | POA: Diagnosis not present

## 2023-02-09 DIAGNOSIS — R278 Other lack of coordination: Secondary | ICD-10-CM | POA: Diagnosis not present

## 2023-02-09 DIAGNOSIS — Z931 Gastrostomy status: Secondary | ICD-10-CM | POA: Diagnosis not present

## 2023-02-09 DIAGNOSIS — N186 End stage renal disease: Secondary | ICD-10-CM | POA: Diagnosis not present

## 2023-02-09 DIAGNOSIS — Z992 Dependence on renal dialysis: Secondary | ICD-10-CM | POA: Diagnosis not present

## 2023-02-09 DIAGNOSIS — I69354 Hemiplegia and hemiparesis following cerebral infarction affecting left non-dominant side: Secondary | ICD-10-CM | POA: Diagnosis not present

## 2023-02-09 DIAGNOSIS — I69391 Dysphagia following cerebral infarction: Secondary | ICD-10-CM | POA: Diagnosis not present

## 2023-02-09 DIAGNOSIS — F430 Acute stress reaction: Secondary | ICD-10-CM | POA: Diagnosis not present

## 2023-02-09 DIAGNOSIS — Z86718 Personal history of other venous thrombosis and embolism: Secondary | ICD-10-CM | POA: Diagnosis not present

## 2023-02-09 DIAGNOSIS — M6281 Muscle weakness (generalized): Secondary | ICD-10-CM | POA: Diagnosis not present

## 2023-02-09 DIAGNOSIS — I2510 Atherosclerotic heart disease of native coronary artery without angina pectoris: Secondary | ICD-10-CM | POA: Diagnosis not present

## 2023-02-09 DIAGNOSIS — Z7901 Long term (current) use of anticoagulants: Secondary | ICD-10-CM | POA: Diagnosis not present

## 2023-02-09 DIAGNOSIS — R1310 Dysphagia, unspecified: Secondary | ICD-10-CM | POA: Diagnosis not present

## 2023-02-10 DIAGNOSIS — I69354 Hemiplegia and hemiparesis following cerebral infarction affecting left non-dominant side: Secondary | ICD-10-CM | POA: Diagnosis not present

## 2023-02-10 DIAGNOSIS — N186 End stage renal disease: Secondary | ICD-10-CM | POA: Diagnosis not present

## 2023-02-10 DIAGNOSIS — N2581 Secondary hyperparathyroidism of renal origin: Secondary | ICD-10-CM | POA: Diagnosis not present

## 2023-02-10 DIAGNOSIS — M6281 Muscle weakness (generalized): Secondary | ICD-10-CM | POA: Diagnosis not present

## 2023-02-10 DIAGNOSIS — R1310 Dysphagia, unspecified: Secondary | ICD-10-CM | POA: Diagnosis not present

## 2023-02-10 DIAGNOSIS — R5381 Other malaise: Secondary | ICD-10-CM | POA: Diagnosis not present

## 2023-02-10 DIAGNOSIS — R278 Other lack of coordination: Secondary | ICD-10-CM | POA: Diagnosis not present

## 2023-02-10 DIAGNOSIS — I10 Essential (primary) hypertension: Secondary | ICD-10-CM | POA: Diagnosis not present

## 2023-02-10 DIAGNOSIS — Z992 Dependence on renal dialysis: Secondary | ICD-10-CM | POA: Diagnosis not present

## 2023-02-10 DIAGNOSIS — D509 Iron deficiency anemia, unspecified: Secondary | ICD-10-CM | POA: Diagnosis not present

## 2023-02-10 DIAGNOSIS — D631 Anemia in chronic kidney disease: Secondary | ICD-10-CM | POA: Diagnosis not present

## 2023-02-10 DIAGNOSIS — E569 Vitamin deficiency, unspecified: Secondary | ICD-10-CM | POA: Diagnosis not present

## 2023-02-11 ENCOUNTER — Ambulatory Visit (HOSPITAL_COMMUNITY)
Admission: RE | Admit: 2023-02-11 | Discharge: 2023-02-11 | Disposition: A | Discharge status: Home / Self Care | Payer: BLUE CROSS/BLUE SHIELD | Source: Ambulatory Visit | Attending: Radiology | Admitting: Radiology

## 2023-02-11 ENCOUNTER — Other Ambulatory Visit (HOSPITAL_COMMUNITY): Payer: Self-pay | Admitting: Radiology

## 2023-02-11 ENCOUNTER — Ambulatory Visit (HOSPITAL_COMMUNITY)
Admission: RE | Admit: 2023-02-11 | Discharge: 2023-02-11 | Disposition: A | Discharge status: Home / Self Care | Payer: Medicare PPO | Source: Ambulatory Visit | Attending: Radiology | Admitting: Radiology

## 2023-02-11 DIAGNOSIS — M6281 Muscle weakness (generalized): Secondary | ICD-10-CM | POA: Diagnosis not present

## 2023-02-11 DIAGNOSIS — Z992 Dependence on renal dialysis: Secondary | ICD-10-CM | POA: Diagnosis not present

## 2023-02-11 DIAGNOSIS — Z434 Encounter for attention to other artificial openings of digestive tract: Secondary | ICD-10-CM | POA: Insufficient documentation

## 2023-02-11 DIAGNOSIS — R278 Other lack of coordination: Secondary | ICD-10-CM | POA: Diagnosis not present

## 2023-02-11 DIAGNOSIS — I69354 Hemiplegia and hemiparesis following cerebral infarction affecting left non-dominant side: Secondary | ICD-10-CM | POA: Diagnosis not present

## 2023-02-11 DIAGNOSIS — K810 Acute cholecystitis: Secondary | ICD-10-CM | POA: Diagnosis not present

## 2023-02-11 DIAGNOSIS — N186 End stage renal disease: Secondary | ICD-10-CM | POA: Diagnosis not present

## 2023-02-11 DIAGNOSIS — R1310 Dysphagia, unspecified: Secondary | ICD-10-CM | POA: Diagnosis not present

## 2023-02-11 HISTORY — PX: IR CATHETER TUBE CHANGE: IMG717

## 2023-02-11 HISTORY — PX: IR EXCHANGE BILIARY DRAIN: IMG6046

## 2023-02-11 MED ORDER — LIDOCAINE HCL 1 % IJ SOLN
INTRAMUSCULAR | Status: AC
  Filled 2023-02-11: qty 20

## 2023-02-11 MED ORDER — IOHEXOL 300 MG/ML  SOLN
50.0000 mL | Freq: Once | INTRAMUSCULAR | Status: AC | PRN
  Administered 2023-02-11: 15 mL

## 2023-02-11 MED ORDER — LIDOCAINE HCL 1 % IJ SOLN
20.0000 mL | Freq: Once | INTRAMUSCULAR | Status: AC
  Administered 2023-02-11: 5 mL via INTRADERMAL

## 2023-02-12 ENCOUNTER — Encounter (HOSPITAL_COMMUNITY): Payer: Self-pay

## 2023-02-12 ENCOUNTER — Other Ambulatory Visit (HOSPITAL_COMMUNITY): Payer: Self-pay | Admitting: Radiology

## 2023-02-12 DIAGNOSIS — R278 Other lack of coordination: Secondary | ICD-10-CM | POA: Diagnosis not present

## 2023-02-12 DIAGNOSIS — D509 Iron deficiency anemia, unspecified: Secondary | ICD-10-CM | POA: Diagnosis not present

## 2023-02-12 DIAGNOSIS — M6281 Muscle weakness (generalized): Secondary | ICD-10-CM | POA: Diagnosis not present

## 2023-02-12 DIAGNOSIS — N2581 Secondary hyperparathyroidism of renal origin: Secondary | ICD-10-CM | POA: Diagnosis not present

## 2023-02-12 DIAGNOSIS — I69354 Hemiplegia and hemiparesis following cerebral infarction affecting left non-dominant side: Secondary | ICD-10-CM | POA: Diagnosis not present

## 2023-02-12 DIAGNOSIS — Z992 Dependence on renal dialysis: Secondary | ICD-10-CM | POA: Diagnosis not present

## 2023-02-12 DIAGNOSIS — D631 Anemia in chronic kidney disease: Secondary | ICD-10-CM | POA: Diagnosis not present

## 2023-02-12 DIAGNOSIS — N186 End stage renal disease: Secondary | ICD-10-CM | POA: Diagnosis not present

## 2023-02-12 DIAGNOSIS — R1310 Dysphagia, unspecified: Secondary | ICD-10-CM | POA: Diagnosis not present

## 2023-02-12 DIAGNOSIS — K810 Acute cholecystitis: Secondary | ICD-10-CM

## 2023-02-14 DIAGNOSIS — N186 End stage renal disease: Secondary | ICD-10-CM | POA: Diagnosis not present

## 2023-02-15 DIAGNOSIS — R1310 Dysphagia, unspecified: Secondary | ICD-10-CM | POA: Diagnosis not present

## 2023-02-15 DIAGNOSIS — R278 Other lack of coordination: Secondary | ICD-10-CM | POA: Diagnosis not present

## 2023-02-15 DIAGNOSIS — M6281 Muscle weakness (generalized): Secondary | ICD-10-CM | POA: Diagnosis not present

## 2023-02-15 DIAGNOSIS — N2581 Secondary hyperparathyroidism of renal origin: Secondary | ICD-10-CM | POA: Diagnosis not present

## 2023-02-15 DIAGNOSIS — D631 Anemia in chronic kidney disease: Secondary | ICD-10-CM | POA: Diagnosis not present

## 2023-02-15 DIAGNOSIS — D509 Iron deficiency anemia, unspecified: Secondary | ICD-10-CM | POA: Diagnosis not present

## 2023-02-15 DIAGNOSIS — N186 End stage renal disease: Secondary | ICD-10-CM | POA: Diagnosis not present

## 2023-02-15 DIAGNOSIS — I69354 Hemiplegia and hemiparesis following cerebral infarction affecting left non-dominant side: Secondary | ICD-10-CM | POA: Diagnosis not present

## 2023-02-15 DIAGNOSIS — Z992 Dependence on renal dialysis: Secondary | ICD-10-CM | POA: Diagnosis not present

## 2023-02-16 DIAGNOSIS — Z931 Gastrostomy status: Secondary | ICD-10-CM | POA: Diagnosis not present

## 2023-02-16 DIAGNOSIS — I10 Essential (primary) hypertension: Secondary | ICD-10-CM | POA: Diagnosis not present

## 2023-02-16 DIAGNOSIS — Z992 Dependence on renal dialysis: Secondary | ICD-10-CM | POA: Diagnosis not present

## 2023-02-16 DIAGNOSIS — F430 Acute stress reaction: Secondary | ICD-10-CM | POA: Diagnosis not present

## 2023-02-16 DIAGNOSIS — R278 Other lack of coordination: Secondary | ICD-10-CM | POA: Diagnosis not present

## 2023-02-16 DIAGNOSIS — K811 Chronic cholecystitis: Secondary | ICD-10-CM | POA: Diagnosis not present

## 2023-02-16 DIAGNOSIS — I69354 Hemiplegia and hemiparesis following cerebral infarction affecting left non-dominant side: Secondary | ICD-10-CM | POA: Diagnosis not present

## 2023-02-16 DIAGNOSIS — I69391 Dysphagia following cerebral infarction: Secondary | ICD-10-CM | POA: Diagnosis not present

## 2023-02-16 DIAGNOSIS — N186 End stage renal disease: Secondary | ICD-10-CM | POA: Diagnosis not present

## 2023-02-16 DIAGNOSIS — R1310 Dysphagia, unspecified: Secondary | ICD-10-CM | POA: Diagnosis not present

## 2023-02-16 DIAGNOSIS — Z434 Encounter for attention to other artificial openings of digestive tract: Secondary | ICD-10-CM | POA: Diagnosis not present

## 2023-02-16 DIAGNOSIS — K810 Acute cholecystitis: Secondary | ICD-10-CM | POA: Diagnosis not present

## 2023-02-16 DIAGNOSIS — M6281 Muscle weakness (generalized): Secondary | ICD-10-CM | POA: Diagnosis not present

## 2023-02-16 NOTE — Progress Notes (Unsigned)
Cardiology Office Note   Date:  02/18/2023   ID:  Samantha [name], DOB 1946/07/22, MRN [id number]  PCP:  Patient, No Pcp Per  Cardiologist:   None Referring:  No ref. provider found   Chief Complaint  Patient presents with   Cardiomyopathy     History of Present Illness: Samantha [name] is a 77 y.o. female who presents for evaluation of cardiomyopathy, CVA and DVT.  She was in the ED for acute cholecystitis.  She has an EF of 45%.  She had basal-mid lateral wall and inferolateral wall hypokinesis.      Unfortunately I do not have any of her medical records.  Her daughters came with her.  She moved here from [place].  They are going to bring me medical records.  In 2021 they report she had a myocardial infarction.  She also had stroke.  She ended up on dialysis apparently acute on chronic renal insufficiency.  Her stroke left her with residual left-sided weakness and she has apparently been in a facility since then.  She is in a wheelchair.  She is total assist to get out of the wheelchair but she can answer my questions and says that she does not get chest pressure, neck or arm discomfort.  She does not have shortness of breath, PND or orthopnea.  There is been no palpitations, presyncope or syncope.  She goes to dialysis 3 times a week and her daughter say that is actually going well.  Looking through her recent hospital records there were no cardiac issues other than a DVT and blood clot.    Of note her daughters say that in 2021 she did have a cardiac cath.  There was mention of maybe doing a cardiac stent but since that event so much going on she was managed medically.  Apparently her ejection fraction was low enough that she was sent home with a LifeVest for about a year.  However, her daughter said her ejection fraction eventually improved to 50%.    When she was in the hospital most recently I looked back and saw the she had been on Norvasc which was stopped on discharge.  She had  been on benazepril/HCT 100/25 but this was discontinued because of low blood pressure.  It looks like her Coreg was reduced from 6.25 twice daily to 3.125 twice daily.  I do not have a record of her most recent blood pressures at dialysis.  There is been no presyncope or syncope.  She has had no other cardiovascular symptoms.   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   Dementia (HCC)    Diabetes mellitus without complication (HCC)    ESRD (end stage renal disease) on dialysis (HCC)    Heart failure (HCC)    HTN (hypertension)    PEG (percutaneous endoscopic gastrostomy) status (HCC)    Renal disorder    Thyroid disease     Past Surgical History:  Procedure Laterality Date   ABDOMINAL HYSTERECTOMY     IR EXCHANGE BILIARY DRAIN  02/11/2023   IR PERC CHOLECYSTOSTOMY  12/16/2022     Current Outpatient Medications  Medication Sig Dispense Refill   apixaban (ELIQUIS) 5 MG TABS tablet Take 1 tablet (5 mg total) by mouth 2 (two) times daily. 60 tablet    bisacodyl (DULCOLAX) 5 MG EC tablet Take 5 mg by mouth daily as needed for moderate constipation.  carvedilol (COREG) 3.125 MG tablet Take 1 tablet (3.125 mg total) by mouth 2 (two) times daily with a meal.     escitalopram (LEXAPRO) 5 MG tablet Take 5 mg by mouth.     insulin aspart (NOVOLOG) 100 UNIT/ML injection 0-9 Units, Subcutaneous, 3 times daily with meals, CBG < 70: Implement Hypoglycemia measures CBG 70 - 120: 0 units CBG 121 - 150: 1 unit CBG 151 - 200: 2 units CBG 201 - 250: 3 units CBG 251 - 300: 5 units CBG 301 - 350: 7 units CBG 351 - 400: 9 units CBG > 400: call MD 10 mL 11   insulin glargine (LANTUS) 100 UNIT/ML injection Inject 0.16 mLs (16 Units total) into the skin at bedtime. 10 mL 11   Nutritional Supplements (FEEDING SUPPLEMENT, OSMOLITE 1.5 CAL,) LIQD Place 237 mLs into feeding tube 3 (three) times daily with meals.  0   Nutritional Supplements  (FEEDING SUPPLEMENT, OSMOLITE 1.5 CAL,) LIQD Place 480 mLs into feeding tube daily. Osmolite 1.5 at 40 mL/hr x 12 hours-start at 6 pm  0   acetaminophen (TYLENOL) 325 MG tablet Take 2 tablets (650 mg total) by mouth every 6 (six) hours as needed for mild pain (or Fever >/= 101). (Patient not taking: Reported on 02/18/2023)     multivitamin (RENA-VIT) TABS tablet Take 1 tablet by mouth at bedtime. (Patient not taking: Reported on 02/18/2023)  0   No current facility-administered medications for this visit.    Allergies:   Patient has no known allergies.    Social History:  The patient  reports that she has never smoked. She does not have any smokeless tobacco history on file. She reports that she does not currently use alcohol. She reports that she does not use drugs.   Family History:  The patient's family history is noncontributory.   ROS:  Please see the history of present illness.   Otherwise, review of systems are positive for none.   All other systems are reviewed and negative.    PHYSICAL EXAM: VS:  BP (!) 141/83 (BP Location: Left Arm, Patient Position: Sitting, Cuff Size: Normal)   Pulse 83   Wt 130 lb (59 kg)   SpO2 98%   BMI 26.26 kg/m  , BMI Body mass index is 26.26 kg/m. GEN:  No distress NECK:  No jugular venous distention at 90 degrees, waveform within normal limits, carotid upstroke brisk and symmetric, no bruits, no thyromegaly LYMPHATICS:  No cervical adenopathy LUNGS:  Clear to auscultation bilaterally BACK:  No CVA tenderness CHEST:  Unremarkable, dialysis port HEART:  S1 and S2 within normal limits, no S3, no S4, no clicks, no rubs, no murmurs ABD:  Positive bowel sounds normal in frequency in pitch, no bruits, no rebound, no guarding, unable to assess midline mass or bruit with the patient seated. EXT:  2 plus pulses throughout, moderate edema, no cyanosis no clubbing SKIN:  No rashes no nodules NEURO: Left hemiparesis PSYCH:  Cognitively intact, oriented to  person place and time   EKG:  EKG Interpretation Date/Time:  Thursday February 18 2023 14:38:27 EDT Ventricular Rate:  83 PR Interval:  172 QRS Duration:  90 QT Interval:  412 QTC Calculation: 484 R Axis:   -71  Text Interpretation: Normal sinus rhythm Left axis deviation Low voltage QRS Poor anterior R wave progression. When compared with ECG of 12-Dec-2022 11:42, axis is no longer rightward. Confirmed by Rollene Rotunda ([id number]) on 02/18/2023 3:13:41 PM     Recent Labs:  12/14/2022: ALT 23 12/15/2022: Magnesium 1.8 12/25/2022: BUN 26; Creatinine, Ser 3.88; Hemoglobin 8.2; Platelets 186; Potassium 4.4; Sodium 135    Lipid Panel No results found for: "CHOL", "TRIG", "HDL", "CHOLHDL", "VLDL", "LDLCALC", "LDLDIRECT"    Wt Readings from Last 3 Encounters:  02/18/23 130 lb (59 kg)      Other studies Reviewed: Additional studies/ records that were reviewed today include: Hospital records. Review of the above records demonstrates:  Please see elsewhere in the note.     ASSESSMENT AND PLAN:  Cardiomyopathy: The patient has an ischemic cardiomyopathy and has not tolerated med titration with low blood pressures.  I will get the old records but I suspect I will discontinue with medical management as she has no overt symptoms.  Volume is managed via dialysis.  HTN: Her blood pressure is low.  No change in meds.  I have asked for nursing home to check blood pressures and send these to me and I have the opportunity I will titrate her beta-blocker perhaps in the future that her back on Entresto or ARB.  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  Her daughters report that previously she was on heparin.  Current medicines are reviewed at length with the patient today.  The patient does not have concerns regarding medicines.  The following changes have been made:  no change  Labs/ tests ordered today include: None  Orders Placed  This Encounter  Procedures   EKG 12-Lead    Disposition:   FU with me in one year.  Signed, Rollene Rotunda, MD  02/18/2023 3:20 PM    [facility]

## 2023-02-17 DIAGNOSIS — Z992 Dependence on renal dialysis: Secondary | ICD-10-CM | POA: Diagnosis not present

## 2023-02-17 DIAGNOSIS — N2581 Secondary hyperparathyroidism of renal origin: Secondary | ICD-10-CM | POA: Diagnosis not present

## 2023-02-17 DIAGNOSIS — D509 Iron deficiency anemia, unspecified: Secondary | ICD-10-CM | POA: Diagnosis not present

## 2023-02-17 DIAGNOSIS — R1310 Dysphagia, unspecified: Secondary | ICD-10-CM | POA: Diagnosis not present

## 2023-02-17 DIAGNOSIS — M6281 Muscle weakness (generalized): Secondary | ICD-10-CM | POA: Diagnosis not present

## 2023-02-17 DIAGNOSIS — N186 End stage renal disease: Secondary | ICD-10-CM | POA: Diagnosis not present

## 2023-02-17 DIAGNOSIS — I69354 Hemiplegia and hemiparesis following cerebral infarction affecting left non-dominant side: Secondary | ICD-10-CM | POA: Diagnosis not present

## 2023-02-17 DIAGNOSIS — R278 Other lack of coordination: Secondary | ICD-10-CM | POA: Diagnosis not present

## 2023-02-17 DIAGNOSIS — D631 Anemia in chronic kidney disease: Secondary | ICD-10-CM | POA: Diagnosis not present

## 2023-02-18 ENCOUNTER — Ambulatory Visit: Payer: Medicare PPO | Attending: Cardiology | Admitting: Cardiology

## 2023-02-18 ENCOUNTER — Encounter: Payer: Self-pay | Admitting: Cardiology

## 2023-02-18 VITALS — BP 141/83 | HR 83 | Wt 130.0 lb

## 2023-02-18 DIAGNOSIS — S31000D Unspecified open wound of lower back and pelvis without penetration into retroperitoneum, subsequent encounter: Secondary | ICD-10-CM | POA: Diagnosis not present

## 2023-02-18 DIAGNOSIS — E569 Vitamin deficiency, unspecified: Secondary | ICD-10-CM | POA: Diagnosis not present

## 2023-02-18 DIAGNOSIS — F329 Major depressive disorder, single episode, unspecified: Secondary | ICD-10-CM | POA: Diagnosis not present

## 2023-02-18 DIAGNOSIS — Z992 Dependence on renal dialysis: Secondary | ICD-10-CM | POA: Diagnosis not present

## 2023-02-18 DIAGNOSIS — N186 End stage renal disease: Secondary | ICD-10-CM | POA: Diagnosis not present

## 2023-02-18 DIAGNOSIS — F0150 Vascular dementia without behavioral disturbance: Secondary | ICD-10-CM | POA: Diagnosis not present

## 2023-02-18 DIAGNOSIS — I429 Cardiomyopathy, unspecified: Secondary | ICD-10-CM | POA: Diagnosis not present

## 2023-02-18 DIAGNOSIS — I69354 Hemiplegia and hemiparesis following cerebral infarction affecting left non-dominant side: Secondary | ICD-10-CM | POA: Diagnosis not present

## 2023-02-18 DIAGNOSIS — R278 Other lack of coordination: Secondary | ICD-10-CM | POA: Diagnosis not present

## 2023-02-18 DIAGNOSIS — I132 Hypertensive heart and chronic kidney disease with heart failure and with stage 5 chronic kidney disease, or end stage renal disease: Secondary | ICD-10-CM | POA: Diagnosis not present

## 2023-02-18 DIAGNOSIS — I639 Cerebral infarction, unspecified: Secondary | ICD-10-CM | POA: Diagnosis not present

## 2023-02-18 DIAGNOSIS — Z8673 Personal history of transient ischemic attack (TIA), and cerebral infarction without residual deficits: Secondary | ICD-10-CM | POA: Diagnosis not present

## 2023-02-18 DIAGNOSIS — R1310 Dysphagia, unspecified: Secondary | ICD-10-CM | POA: Diagnosis not present

## 2023-02-18 DIAGNOSIS — F0390 Unspecified dementia without behavioral disturbance: Secondary | ICD-10-CM | POA: Diagnosis not present

## 2023-02-18 DIAGNOSIS — M6281 Muscle weakness (generalized): Secondary | ICD-10-CM | POA: Diagnosis not present

## 2023-02-18 DIAGNOSIS — L89302 Pressure ulcer of unspecified buttock, stage 2: Secondary | ICD-10-CM | POA: Diagnosis not present

## 2023-02-18 DIAGNOSIS — Z1330 Encounter for screening examination for mental health and behavioral disorders, unspecified: Secondary | ICD-10-CM | POA: Diagnosis not present

## 2023-02-18 NOTE — Patient Instructions (Signed)
Medication Instructions:  NO CHANGES  *If you need a refill on your cardiac medications before your next appointment, please call your pharmacy*   Follow-Up: At [facility], you and your health needs are our priority.  As part of our continuing mission to provide you with exceptional heart care, we have created designated Provider Care Teams.  These Care Teams include your primary Cardiologist (physician) and Advanced Practice Providers (APPs -  Physician Assistants and Nurse Practitioners) who all work together to provide you with the care you need, when you need it.  We recommend signing up for the patient portal called "MyChart".  Sign up information is provided on this After Visit Summary.  MyChart is used to connect with patients for Virtual Visits (Telemedicine).  Patients are able to view lab/test results, encounter notes, upcoming appointments, etc.  Non-urgent messages can be sent to your provider as well.   To learn more about what you can do with MyChart, go to [email or web address].    Your next appointment:   6 months with Dr. Antoine Poche    Other Instructions  Please bring records to Dr. Jenene Slicker office Our office is open M-F from 8am-5pm - closed 7/4

## 2023-02-19 DIAGNOSIS — R278 Other lack of coordination: Secondary | ICD-10-CM | POA: Diagnosis not present

## 2023-02-19 DIAGNOSIS — N2581 Secondary hyperparathyroidism of renal origin: Secondary | ICD-10-CM | POA: Diagnosis not present

## 2023-02-19 DIAGNOSIS — M6281 Muscle weakness (generalized): Secondary | ICD-10-CM | POA: Diagnosis not present

## 2023-02-19 DIAGNOSIS — R1310 Dysphagia, unspecified: Secondary | ICD-10-CM | POA: Diagnosis not present

## 2023-02-19 DIAGNOSIS — N186 End stage renal disease: Secondary | ICD-10-CM | POA: Diagnosis not present

## 2023-02-19 DIAGNOSIS — D631 Anemia in chronic kidney disease: Secondary | ICD-10-CM | POA: Diagnosis not present

## 2023-02-19 DIAGNOSIS — I69354 Hemiplegia and hemiparesis following cerebral infarction affecting left non-dominant side: Secondary | ICD-10-CM | POA: Diagnosis not present

## 2023-02-19 DIAGNOSIS — D509 Iron deficiency anemia, unspecified: Secondary | ICD-10-CM | POA: Diagnosis not present

## 2023-02-19 DIAGNOSIS — Z992 Dependence on renal dialysis: Secondary | ICD-10-CM | POA: Diagnosis not present

## 2023-02-20 ENCOUNTER — Emergency Department (HOSPITAL_COMMUNITY): Payer: Medicare PPO

## 2023-02-20 ENCOUNTER — Emergency Department (HOSPITAL_COMMUNITY)
Admission: EM | Admit: 2023-02-20 | Discharge: 2023-02-20 | Disposition: A | Discharge status: Home / Self Care | Payer: Medicare PPO | Attending: Emergency Medicine | Admitting: Emergency Medicine

## 2023-02-20 ENCOUNTER — Other Ambulatory Visit: Payer: Self-pay

## 2023-02-20 ENCOUNTER — Encounter (HOSPITAL_COMMUNITY): Payer: Self-pay

## 2023-02-20 DIAGNOSIS — Z7901 Long term (current) use of anticoagulants: Secondary | ICD-10-CM | POA: Insufficient documentation

## 2023-02-20 DIAGNOSIS — Z4682 Encounter for fitting and adjustment of non-vascular catheter: Secondary | ICD-10-CM | POA: Diagnosis not present

## 2023-02-20 DIAGNOSIS — Z7401 Bed confinement status: Secondary | ICD-10-CM | POA: Diagnosis not present

## 2023-02-20 DIAGNOSIS — R109 Unspecified abdominal pain: Secondary | ICD-10-CM | POA: Diagnosis not present

## 2023-02-20 DIAGNOSIS — K5900 Constipation, unspecified: Secondary | ICD-10-CM

## 2023-02-20 DIAGNOSIS — Z794 Long term (current) use of insulin: Secondary | ICD-10-CM | POA: Insufficient documentation

## 2023-02-20 DIAGNOSIS — K9423 Gastrostomy malfunction: Secondary | ICD-10-CM | POA: Diagnosis not present

## 2023-02-20 DIAGNOSIS — Z931 Gastrostomy status: Secondary | ICD-10-CM | POA: Diagnosis not present

## 2023-02-20 DIAGNOSIS — R1084 Generalized abdominal pain: Secondary | ICD-10-CM | POA: Insufficient documentation

## 2023-02-20 DIAGNOSIS — K8020 Calculus of gallbladder without cholecystitis without obstruction: Secondary | ICD-10-CM | POA: Diagnosis not present

## 2023-02-20 DIAGNOSIS — R404 Transient alteration of awareness: Secondary | ICD-10-CM | POA: Diagnosis not present

## 2023-02-20 LAB — LIPASE, BLOOD: Lipase: 53 U/L — ABNORMAL HIGH (ref 11–51)

## 2023-02-20 LAB — COMPREHENSIVE METABOLIC PANEL
ALT: 34 U/L (ref 0–44)
AST: 36 U/L (ref 15–41)
Albumin: 3.4 g/dL — ABNORMAL LOW (ref 3.5–5.0)
Alkaline Phosphatase: 86 U/L (ref 38–126)
Anion gap: 7 (ref 5–15)
BUN: 10 mg/dL (ref 8–23)
CO2: 26 mmol/L (ref 22–32)
Calcium: 9.3 mg/dL (ref 8.9–10.3)
Chloride: 98 mmol/L (ref 98–111)
Creatinine, Ser: 2.03 mg/dL — ABNORMAL HIGH (ref 0.44–1.00)
GFR, Estimated: 25 mL/min — ABNORMAL LOW (ref >60)
Glucose, Bld: 123 mg/dL — ABNORMAL HIGH (ref 70–99)
Potassium: 3.5 mmol/L (ref 3.5–5.1)
Sodium: 131 mmol/L — ABNORMAL LOW (ref 135–145)
Total Bilirubin: 0.4 mg/dL (ref 0.3–1.2)
Total Protein: 6.6 g/dL (ref 6.5–8.1)

## 2023-02-20 LAB — CBC
HCT: 36.3 % (ref 36.0–46.0)
Hemoglobin: 12.2 g/dL (ref 12.0–15.0)
MCH: 33.6 pg (ref 26.0–34.0)
MCHC: 33.6 g/dL (ref 30.0–36.0)
MCV: 100 fL (ref 80.0–100.0)
Platelets: 143 10*3/uL — ABNORMAL LOW (ref 150–400)
RBC: 3.63 MIL/uL — ABNORMAL LOW (ref 3.87–5.11)
RDW: 13.4 % (ref 11.5–15.5)
WBC: 7.3 10*3/uL (ref 4.0–10.5)
nRBC: 0 % (ref 0.0–0.2)

## 2023-02-20 MED ORDER — IOHEXOL 350 MG/ML SOLN
75.0000 mL | Freq: Once | INTRAVENOUS | Status: AC | PRN
  Administered 2023-02-20: 75 mL via INTRAVENOUS

## 2023-02-20 MED ORDER — DIATRIZOATE MEGLUMINE & SODIUM 66-10 % PO SOLN
30.0000 mL | Freq: Once | ORAL | Status: AC
  Administered 2023-02-20: 30 mL
  Filled 2023-02-20: qty 30

## 2023-02-20 NOTE — ED Provider Notes (Signed)
  Physical Exam  BP (!) 120/55 (BP Location: Right Arm)   Pulse 75   Temp 98.1 F (36.7 C) (Oral)   Resp 17   SpO2 100%   Physical Exam  Procedures  Procedures  ED Course / MDM    Medical Decision Making Amount and/or Complexity of Data Reviewed Labs: ordered. Radiology: ordered.  Risk Prescription drug management.   Received in signout.  Abdominal pain.  Pending placement verification of PEG tube.  PEG tube verified.  Discussed with patient and family member.  Does not make much urine on dialysis.  Has no lower abdominal tenderness but also did have some constipation.  Do not think we need the urine at this time.  Will discharge.       Benjiman Core, MD 02/20/23 [phone]

## 2023-02-20 NOTE — ED Notes (Signed)
Pt family member requesting home meds Carvedilol and Eliquis. Informed d/t pt being d.c she may have to wait until back at facility but RN will ask EDP.

## 2023-02-20 NOTE — ED Triage Notes (Signed)
Pt arrived from [facility] via [facility] c/o abd pain. Pt is alert to person and place, but not to situation or time. Pt is able to tell be that her stomach hurts but no other details. Per EMS family also wanted her sent for g-tube problem. Pt is poor historian, awaiting family to arrive for more details.

## 2023-02-20 NOTE — ED Notes (Signed)
MD Pollina at bedside attempting to perform a fecal disimpaction, which per MD was unsuccessful due to soft stool. G-tube replaced by MD Pollina at this time. Family aware.

## 2023-02-20 NOTE — ED Provider Notes (Signed)
[place] EMERGENCY DEPARTMENT AT [facility] Provider Note   CSN: [id number] Arrival date & time: 02/20/23  7829     History  Chief Complaint  Patient presents with   Abdominal Pain   g-tube problem    Samantha [name] is a 77 y.o. female.  Patient sent to the emergency department by ambulance from skilled nursing facility.  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  Patient with recent hospitalization for cholecystitis, has a cholecystostomy tube.  She has also G-tube dependent.  Patient complaining of diffuse abdominal pain.       Home Medications Prior to Admission medications   Medication Sig Start Date End Date Taking? Authorizing Provider  acetaminophen (TYLENOL) 325 MG tablet Take 2 tablets (650 mg total) by mouth every 6 (six) hours as needed for mild pain (or Fever >/= 101). Patient not taking: Reported on 02/18/2023 12/25/22   Maretta Bees, MD  apixaban (ELIQUIS) 5 MG TABS tablet Take 1 tablet (5 mg total) by mouth 2 (two) times daily. 12/25/22   Ghimire, Werner Lean, MD  bisacodyl (DULCOLAX) 5 MG EC tablet Take 5 mg by mouth daily as needed for moderate constipation.    [provider]  carvedilol (COREG) 3.125 MG tablet Take 1 tablet (3.125 mg total) by mouth 2 (two) times daily with a meal. 12/25/22   Ghimire, Werner Lean, MD  escitalopram (LEXAPRO) 5 MG tablet Take 5 mg by mouth.    [provider]  insulin aspart (NOVOLOG) 100 UNIT/ML injection 0-9 Units, Subcutaneous, 3 times daily with meals, CBG < 70: Implement Hypoglycemia measures CBG 70 - 120: 0 units CBG 121 - 150: 1 unit CBG 151 - 200: 2 units CBG 201 - 250: 3 units CBG 251 - 300: 5 units CBG 301 - 350: 7 units CBG 351 - 400: 9 units CBG > 400: call MD 12/25/22   Maretta Bees, MD  insulin glargine (LANTUS) 100 UNIT/ML injection Inject 0.16 mLs (16 Units total) into the skin at bedtime. 12/25/22   Ghimire, Werner Lean, MD  multivitamin  (RENA-VIT) TABS tablet Take 1 tablet by mouth at bedtime. Patient not taking: Reported on 02/18/2023 12/25/22   Maretta Bees, MD  Nutritional Supplements (FEEDING SUPPLEMENT, OSMOLITE 1.5 CAL,) LIQD Place 237 mLs into feeding tube 3 (three) times daily with meals. 12/25/22   Ghimire, Werner Lean, MD  Nutritional Supplements (FEEDING SUPPLEMENT, OSMOLITE 1.5 CAL,) LIQD Place 480 mLs into feeding tube daily. Osmolite 1.5 at 40 mL/hr x 12 hours-start at 6 pm 12/25/22   Ghimire, Werner Lean, MD      Allergies    Patient has no known allergies.    Review of Systems   Review of Systems  Physical Exam Updated Vital Signs BP (!) 120/55 (BP Location: Right Arm)   Pulse 75   Temp 98.1 F (36.7 C) (Oral)   Resp 17   SpO2 100%  Physical Exam Vitals and nursing note reviewed.  Constitutional:      General: She is not in acute distress.    Appearance: She is well-developed.  HENT:     Head: Normocephalic and atraumatic.     Mouth/Throat:     Mouth: Mucous membranes are moist.  Eyes:     General: Vision grossly intact. Gaze aligned appropriately.     Extraocular Movements: Extraocular movements intact.     Conjunctiva/sclera: Conjunctivae normal.  Cardiovascular:     Rate and Rhythm: Normal rate and regular  rhythm.     Pulses: Normal pulses.     Heart sounds: Normal heart sounds, S1 normal and S2 normal. No murmur heard.    No friction rub. No gallop.  Pulmonary:     Effort: Pulmonary effort is normal. No respiratory distress.     Breath sounds: Normal breath sounds.  Abdominal:     General: Bowel sounds are normal.     Palpations: Abdomen is soft.     Tenderness: There is generalized abdominal tenderness. There is no guarding or rebound.     Hernia: No hernia is present.     Comments: Cholecystostomy tube in place right upper quadrant.  Site intact, no drainage.  G-tube left upper quadrant.  Tube looks very old but no signs of associated infection.  Musculoskeletal:        General: No  swelling.     Cervical back: Full passive range of motion without pain, normal range of motion and neck supple. No spinous process tenderness or muscular tenderness. Normal range of motion.     Right lower leg: No edema.     Left lower leg: No edema.  Skin:    General: Skin is warm and dry.     Capillary Refill: Capillary refill takes less than 2 seconds.     Findings: No ecchymosis, erythema, rash or wound.  Neurological:     General: No focal deficit present.     Mental Status: She is alert and oriented to person, place, and time.     GCS: GCS eye subscore is 4. GCS verbal subscore is 5. GCS motor subscore is 6.     Cranial Nerves: Cranial nerves 2-12 are intact.     Sensory: Sensation is intact.     Motor: Motor function is intact.     Coordination: Coordination is intact.  Psychiatric:        Attention and Perception: Attention normal.        Mood and Affect: Mood normal.        Speech: Speech normal.        Behavior: Behavior normal.     ED Results / Procedures / Treatments   Labs (all labs ordered are listed, but only abnormal results are displayed) Labs Reviewed  LIPASE, BLOOD - Abnormal; Notable for the following components:      Result Value   Lipase 53 (*)    All other components within normal limits  COMPREHENSIVE METABOLIC PANEL - Abnormal; Notable for the following components:   Sodium 131 (*)    Glucose, Bld 123 (*)    Creatinine, Ser 2.03 (*)    Albumin 3.4 (*)    GFR, Estimated 25 (*)    All other components within normal limits  CBC - Abnormal; Notable for the following components:   RBC 3.63 (*)    Platelets 143 (*)    All other components within normal limits  URINALYSIS, ROUTINE W REFLEX MICROSCOPIC    EKG None  Radiology CT ABDOMEN PELVIS WO CONTRAST  Result Date: 02/20/2023 CLINICAL DATA:  77 year old female with history of acute onset of nonlocalized abdominal pain. EXAM: CT ABDOMEN AND PELVIS WITHOUT CONTRAST TECHNIQUE: Multidetector CT  imaging of the abdomen and pelvis was performed following the standard protocol without IV contrast. RADIATION DOSE REDUCTION: This exam was performed according to the departmental dose-optimization program which includes automated exposure control, adjustment of the mA and/or kV according to patient size and/or use of iterative reconstruction technique. COMPARISON:  CT of the abdomen and  pelvis 12/12/2022. FINDINGS: Lower chest: PermCath with tip deep in the right atrium, likely indirect contact with the tricuspid valve. Atherosclerotic calcifications are noted in the left anterior descending coronary artery. Hepatobiliary: No definite suspicious cystic or solid hepatic lesions are confidently identified on today's noncontrast CT examination. Percutaneous cholecystostomy tube in position with tip reformed in the lumen of the gallbladder which appears largely decompressed around the indwelling catheter. Partially calcified gallstone measuring 9 mm in diameter noted. No pericholecystic fluid or surrounding inflammatory changes. Pancreas: No definite pancreatic mass or peripancreatic fluid collections or inflammatory changes are noted on today's noncontrast CT examination. Spleen: Unremarkable. Adrenals/Urinary Tract: 1.8 cm low-attenuation lesion in the upper pole of the right kidney, incompletely characterized on today's noncontrast CT examination, but statistically likely a cyst (no imaging follow-up recommended). Unenhanced appearance of the left kidney is normal. No hydroureteronephrosis. Urinary bladder is nearly completely decompressed, but otherwise unremarkable in appearance. Bilateral adrenal glands are normal in appearance. Stomach/Bowel: Percutaneous gastrostomy tube in position with retention balloon in the distal body of the stomach. Stomach is otherwise unremarkable in appearance. No pathologic dilatation of small bowel or colon. Large amount of well-formed stool in the rectal vault. Normal appendix.  Vascular/Lymphatic: IVC filter in position with tip terminating shortly below the level of the renal veins. Atherosclerosis in the abdominal aorta and pelvic vasculature. No lymphadenopathy noted in the abdomen or pelvis. Reproductive: Status post hysterectomy. Ovaries are not confidently identified may be surgically absent or atrophic. Other: No significant volume of ascites.  No pneumoperitoneum. Musculoskeletal: There are no aggressive appearing lytic or blastic lesions noted in the visualized portions of the skeleton. IMPRESSION: 1. No acute findings are noted in the abdomen or pelvis. 2. Specifically, the percutaneous gastrostomy tube appears appropriately located. 3. Percutaneous cholecystostomy tube also appears appropriately located in the gallbladder. Small gallstone noted. No pericholecystic fluid or surrounding inflammatory changes noted at this time. 4. Large volume of well-formed stool in the rectal vault, which may suggest mild fecal impaction, however, there is no significant proximal colonic distention at this time. 5. Aortic atherosclerosis, in addition to left anterior descending coronary artery disease. Assessment for potential risk factor modification, dietary therapy or pharmacologic therapy may be warranted, if clinically indicated. 6. Additional incidental findings, as above. Electronically Signed   By: Trudie Reed M.D.   On: 02/20/2023 06:43    Procedures Gastrostomy tube replacement  Date/Time: 02/20/2023 7:05 AM  Performed by: Gilda Crease, MD Authorized by: Gilda Crease, MD  Consent: Verbal consent obtained. Risks and benefits: risks, benefits and alternatives were discussed Consent given by: patient and guardian Patient understanding: patient states understanding of the procedure being performed Patient consent: the patient's understanding of the procedure matches consent given Procedure consent: procedure consent matches procedure scheduled Relevant  documents: relevant documents present and verified Test results: test results available and properly labeled Site marked: the operative site was marked Imaging studies: imaging studies available Required items: required blood products, implants, devices, and special equipment available Patient identity confirmed: verbally with patient Time out: Immediately prior to procedure a "time out" was called to verify the correct patient, procedure, equipment, support staff and site/side marked as required. Preparation: Patient was prepped and draped in the usual sterile fashion. Local anesthesia used: no  Anesthesia: Local anesthesia used: no  Sedation: Patient sedated: no  Patient tolerance: patient tolerated the procedure well with no immediate complications       Medications Ordered in ED Medications  iohexol (OMNIPAQUE) 350 MG/ML  injection 75 mL (75 mLs Intravenous Contrast Given 02/20/23 0617)    ED Course/ Medical Decision Making/ A&P                             Medical Decision Making Amount and/or Complexity of Data Reviewed Labs: ordered. Radiology: ordered.  Risk Prescription drug management.   Differential Diagnosis considered includes, but not limited to: Cholelithiasis; cholecystitis; cholangitis; bowel obstruction; esophagitis; gastritis; peptic ulcer disease; pancreatitis; cardiac.   Patient reports diffuse abdominal pain.  Abdominal exam, however, fairly unremarkable.  Lab work also unremarkable.  CT scan performed.  No acute abnormality.  I did perform a rectal.  There is stool in the rectum but it is extremely soft and not amenable to disimpaction.  No blockage likely secondary to the softness of the stool.  I did review her recent records.  She was admitted for cholecystitis, was a poor operative candidate and has a cholecystostomy tube.  There is very minimal drainage at this time.  Insertion site without complications.  Family reports that she saw her  surgeon in the last week, there was still a little bit of drainage and the cholecystostomy is to be maintained, will continue follow-up with surgery.  G-tube is obviously very old.  Family members tell me that it seems clogged and it leaks a lot.  It was therefore replaced by myself.  X-ray pending to confirm placement.        Final Clinical Impression(s) / ED Diagnoses Final diagnoses:  Generalized abdominal pain    Rx / DC Orders ED Discharge Orders     None         Gilda Crease, MD 02/20/23 [phone]

## 2023-02-21 DIAGNOSIS — Z992 Dependence on renal dialysis: Secondary | ICD-10-CM | POA: Diagnosis not present

## 2023-02-21 DIAGNOSIS — N186 End stage renal disease: Secondary | ICD-10-CM | POA: Diagnosis not present

## 2023-02-22 DIAGNOSIS — I69354 Hemiplegia and hemiparesis following cerebral infarction affecting left non-dominant side: Secondary | ICD-10-CM | POA: Diagnosis not present

## 2023-02-22 DIAGNOSIS — E118 Type 2 diabetes mellitus with unspecified complications: Secondary | ICD-10-CM | POA: Diagnosis not present

## 2023-02-22 DIAGNOSIS — N186 End stage renal disease: Secondary | ICD-10-CM | POA: Diagnosis not present

## 2023-02-22 DIAGNOSIS — M6281 Muscle weakness (generalized): Secondary | ICD-10-CM | POA: Diagnosis not present

## 2023-02-22 DIAGNOSIS — Z992 Dependence on renal dialysis: Secondary | ICD-10-CM | POA: Diagnosis not present

## 2023-02-22 DIAGNOSIS — R278 Other lack of coordination: Secondary | ICD-10-CM | POA: Diagnosis not present

## 2023-02-22 DIAGNOSIS — M24572 Contracture, left ankle: Secondary | ICD-10-CM | POA: Diagnosis not present

## 2023-02-22 DIAGNOSIS — M24571 Contracture, right ankle: Secondary | ICD-10-CM | POA: Diagnosis not present

## 2023-02-23 DIAGNOSIS — E118 Type 2 diabetes mellitus with unspecified complications: Secondary | ICD-10-CM | POA: Diagnosis not present

## 2023-02-23 DIAGNOSIS — N186 End stage renal disease: Secondary | ICD-10-CM | POA: Diagnosis not present

## 2023-02-23 DIAGNOSIS — M6281 Muscle weakness (generalized): Secondary | ICD-10-CM | POA: Diagnosis not present

## 2023-02-23 DIAGNOSIS — R278 Other lack of coordination: Secondary | ICD-10-CM | POA: Diagnosis not present

## 2023-02-23 DIAGNOSIS — M24571 Contracture, right ankle: Secondary | ICD-10-CM | POA: Diagnosis not present

## 2023-02-23 DIAGNOSIS — I69354 Hemiplegia and hemiparesis following cerebral infarction affecting left non-dominant side: Secondary | ICD-10-CM | POA: Diagnosis not present

## 2023-02-23 DIAGNOSIS — M24572 Contracture, left ankle: Secondary | ICD-10-CM | POA: Diagnosis not present

## 2023-02-23 DIAGNOSIS — Z992 Dependence on renal dialysis: Secondary | ICD-10-CM | POA: Diagnosis not present

## 2023-02-25 DIAGNOSIS — Z992 Dependence on renal dialysis: Secondary | ICD-10-CM | POA: Diagnosis not present

## 2023-02-25 DIAGNOSIS — I69354 Hemiplegia and hemiparesis following cerebral infarction affecting left non-dominant side: Secondary | ICD-10-CM | POA: Diagnosis not present

## 2023-02-25 DIAGNOSIS — M24571 Contracture, right ankle: Secondary | ICD-10-CM | POA: Diagnosis not present

## 2023-02-25 DIAGNOSIS — E118 Type 2 diabetes mellitus with unspecified complications: Secondary | ICD-10-CM | POA: Diagnosis not present

## 2023-02-25 DIAGNOSIS — N186 End stage renal disease: Secondary | ICD-10-CM | POA: Diagnosis not present

## 2023-02-25 DIAGNOSIS — M24572 Contracture, left ankle: Secondary | ICD-10-CM | POA: Diagnosis not present

## 2023-02-25 DIAGNOSIS — M6281 Muscle weakness (generalized): Secondary | ICD-10-CM | POA: Diagnosis not present

## 2023-02-25 DIAGNOSIS — R278 Other lack of coordination: Secondary | ICD-10-CM | POA: Diagnosis not present

## 2023-02-26 DIAGNOSIS — M24571 Contracture, right ankle: Secondary | ICD-10-CM | POA: Diagnosis not present

## 2023-02-26 DIAGNOSIS — N186 End stage renal disease: Secondary | ICD-10-CM | POA: Diagnosis not present

## 2023-02-26 DIAGNOSIS — M24572 Contracture, left ankle: Secondary | ICD-10-CM | POA: Diagnosis not present

## 2023-02-26 DIAGNOSIS — R278 Other lack of coordination: Secondary | ICD-10-CM | POA: Diagnosis not present

## 2023-02-26 DIAGNOSIS — I69354 Hemiplegia and hemiparesis following cerebral infarction affecting left non-dominant side: Secondary | ICD-10-CM | POA: Diagnosis not present

## 2023-02-26 DIAGNOSIS — E118 Type 2 diabetes mellitus with unspecified complications: Secondary | ICD-10-CM | POA: Diagnosis not present

## 2023-02-26 DIAGNOSIS — M6281 Muscle weakness (generalized): Secondary | ICD-10-CM | POA: Diagnosis not present

## 2023-02-26 DIAGNOSIS — Z992 Dependence on renal dialysis: Secondary | ICD-10-CM | POA: Diagnosis not present

## 2023-03-01 DIAGNOSIS — E118 Type 2 diabetes mellitus with unspecified complications: Secondary | ICD-10-CM | POA: Diagnosis not present

## 2023-03-01 DIAGNOSIS — M6281 Muscle weakness (generalized): Secondary | ICD-10-CM | POA: Diagnosis not present

## 2023-03-01 DIAGNOSIS — M24571 Contracture, right ankle: Secondary | ICD-10-CM | POA: Diagnosis not present

## 2023-03-01 DIAGNOSIS — M24572 Contracture, left ankle: Secondary | ICD-10-CM | POA: Diagnosis not present

## 2023-03-01 DIAGNOSIS — N186 End stage renal disease: Secondary | ICD-10-CM | POA: Diagnosis not present

## 2023-03-01 DIAGNOSIS — Z992 Dependence on renal dialysis: Secondary | ICD-10-CM | POA: Diagnosis not present

## 2023-03-01 DIAGNOSIS — I69354 Hemiplegia and hemiparesis following cerebral infarction affecting left non-dominant side: Secondary | ICD-10-CM | POA: Diagnosis not present

## 2023-03-01 DIAGNOSIS — R278 Other lack of coordination: Secondary | ICD-10-CM | POA: Diagnosis not present

## 2023-03-02 ENCOUNTER — Ambulatory Visit: Payer: Medicare Other | Admitting: Internal Medicine

## 2023-03-02 DIAGNOSIS — I69354 Hemiplegia and hemiparesis following cerebral infarction affecting left non-dominant side: Secondary | ICD-10-CM | POA: Diagnosis not present

## 2023-03-02 DIAGNOSIS — Z992 Dependence on renal dialysis: Secondary | ICD-10-CM | POA: Diagnosis not present

## 2023-03-02 DIAGNOSIS — M24571 Contracture, right ankle: Secondary | ICD-10-CM | POA: Diagnosis not present

## 2023-03-02 DIAGNOSIS — M6281 Muscle weakness (generalized): Secondary | ICD-10-CM | POA: Diagnosis not present

## 2023-03-02 DIAGNOSIS — R278 Other lack of coordination: Secondary | ICD-10-CM | POA: Diagnosis not present

## 2023-03-02 DIAGNOSIS — E118 Type 2 diabetes mellitus with unspecified complications: Secondary | ICD-10-CM | POA: Diagnosis not present

## 2023-03-02 DIAGNOSIS — M24572 Contracture, left ankle: Secondary | ICD-10-CM | POA: Diagnosis not present

## 2023-03-02 DIAGNOSIS — N186 End stage renal disease: Secondary | ICD-10-CM | POA: Diagnosis not present

## 2023-03-03 DIAGNOSIS — Z992 Dependence on renal dialysis: Secondary | ICD-10-CM | POA: Diagnosis not present

## 2023-03-03 DIAGNOSIS — M6281 Muscle weakness (generalized): Secondary | ICD-10-CM | POA: Diagnosis not present

## 2023-03-03 DIAGNOSIS — M24572 Contracture, left ankle: Secondary | ICD-10-CM | POA: Diagnosis not present

## 2023-03-03 DIAGNOSIS — I69354 Hemiplegia and hemiparesis following cerebral infarction affecting left non-dominant side: Secondary | ICD-10-CM | POA: Diagnosis not present

## 2023-03-03 DIAGNOSIS — R278 Other lack of coordination: Secondary | ICD-10-CM | POA: Diagnosis not present

## 2023-03-03 DIAGNOSIS — E118 Type 2 diabetes mellitus with unspecified complications: Secondary | ICD-10-CM | POA: Diagnosis not present

## 2023-03-03 DIAGNOSIS — M24571 Contracture, right ankle: Secondary | ICD-10-CM | POA: Diagnosis not present

## 2023-03-03 DIAGNOSIS — N186 End stage renal disease: Secondary | ICD-10-CM | POA: Diagnosis not present

## 2023-03-04 DIAGNOSIS — M6281 Muscle weakness (generalized): Secondary | ICD-10-CM | POA: Diagnosis not present

## 2023-03-04 DIAGNOSIS — M24571 Contracture, right ankle: Secondary | ICD-10-CM | POA: Diagnosis not present

## 2023-03-04 DIAGNOSIS — E118 Type 2 diabetes mellitus with unspecified complications: Secondary | ICD-10-CM | POA: Diagnosis not present

## 2023-03-04 DIAGNOSIS — I69354 Hemiplegia and hemiparesis following cerebral infarction affecting left non-dominant side: Secondary | ICD-10-CM | POA: Diagnosis not present

## 2023-03-04 DIAGNOSIS — M24572 Contracture, left ankle: Secondary | ICD-10-CM | POA: Diagnosis not present

## 2023-03-04 DIAGNOSIS — R278 Other lack of coordination: Secondary | ICD-10-CM | POA: Diagnosis not present

## 2023-03-04 DIAGNOSIS — Z992 Dependence on renal dialysis: Secondary | ICD-10-CM | POA: Diagnosis not present

## 2023-03-04 DIAGNOSIS — N186 End stage renal disease: Secondary | ICD-10-CM | POA: Diagnosis not present

## 2023-03-05 DIAGNOSIS — E118 Type 2 diabetes mellitus with unspecified complications: Secondary | ICD-10-CM | POA: Diagnosis not present

## 2023-03-05 DIAGNOSIS — I69354 Hemiplegia and hemiparesis following cerebral infarction affecting left non-dominant side: Secondary | ICD-10-CM | POA: Diagnosis not present

## 2023-03-05 DIAGNOSIS — Z992 Dependence on renal dialysis: Secondary | ICD-10-CM | POA: Diagnosis not present

## 2023-03-05 DIAGNOSIS — M6281 Muscle weakness (generalized): Secondary | ICD-10-CM | POA: Diagnosis not present

## 2023-03-05 DIAGNOSIS — M24571 Contracture, right ankle: Secondary | ICD-10-CM | POA: Diagnosis not present

## 2023-03-05 DIAGNOSIS — R278 Other lack of coordination: Secondary | ICD-10-CM | POA: Diagnosis not present

## 2023-03-05 DIAGNOSIS — N186 End stage renal disease: Secondary | ICD-10-CM | POA: Diagnosis not present

## 2023-03-05 DIAGNOSIS — M24572 Contracture, left ankle: Secondary | ICD-10-CM | POA: Diagnosis not present

## 2023-03-08 DIAGNOSIS — R278 Other lack of coordination: Secondary | ICD-10-CM | POA: Diagnosis not present

## 2023-03-08 DIAGNOSIS — M24571 Contracture, right ankle: Secondary | ICD-10-CM | POA: Diagnosis not present

## 2023-03-08 DIAGNOSIS — I69354 Hemiplegia and hemiparesis following cerebral infarction affecting left non-dominant side: Secondary | ICD-10-CM | POA: Diagnosis not present

## 2023-03-08 DIAGNOSIS — M24572 Contracture, left ankle: Secondary | ICD-10-CM | POA: Diagnosis not present

## 2023-03-08 DIAGNOSIS — E118 Type 2 diabetes mellitus with unspecified complications: Secondary | ICD-10-CM | POA: Diagnosis not present

## 2023-03-08 DIAGNOSIS — Z992 Dependence on renal dialysis: Secondary | ICD-10-CM | POA: Diagnosis not present

## 2023-03-08 DIAGNOSIS — M6281 Muscle weakness (generalized): Secondary | ICD-10-CM | POA: Diagnosis not present

## 2023-03-08 DIAGNOSIS — N186 End stage renal disease: Secondary | ICD-10-CM | POA: Diagnosis not present

## 2023-03-09 DIAGNOSIS — M6281 Muscle weakness (generalized): Secondary | ICD-10-CM | POA: Diagnosis not present

## 2023-03-09 DIAGNOSIS — E118 Type 2 diabetes mellitus with unspecified complications: Secondary | ICD-10-CM | POA: Diagnosis not present

## 2023-03-09 DIAGNOSIS — I69354 Hemiplegia and hemiparesis following cerebral infarction affecting left non-dominant side: Secondary | ICD-10-CM | POA: Diagnosis not present

## 2023-03-09 DIAGNOSIS — M24572 Contracture, left ankle: Secondary | ICD-10-CM | POA: Diagnosis not present

## 2023-03-09 DIAGNOSIS — M24571 Contracture, right ankle: Secondary | ICD-10-CM | POA: Diagnosis not present

## 2023-03-09 DIAGNOSIS — N186 End stage renal disease: Secondary | ICD-10-CM | POA: Diagnosis not present

## 2023-03-09 DIAGNOSIS — R278 Other lack of coordination: Secondary | ICD-10-CM | POA: Diagnosis not present

## 2023-03-09 DIAGNOSIS — Z992 Dependence on renal dialysis: Secondary | ICD-10-CM | POA: Diagnosis not present

## 2023-03-10 DIAGNOSIS — M24572 Contracture, left ankle: Secondary | ICD-10-CM | POA: Diagnosis not present

## 2023-03-10 DIAGNOSIS — R278 Other lack of coordination: Secondary | ICD-10-CM | POA: Diagnosis not present

## 2023-03-10 DIAGNOSIS — E118 Type 2 diabetes mellitus with unspecified complications: Secondary | ICD-10-CM | POA: Diagnosis not present

## 2023-03-10 DIAGNOSIS — Z992 Dependence on renal dialysis: Secondary | ICD-10-CM | POA: Diagnosis not present

## 2023-03-10 DIAGNOSIS — M6281 Muscle weakness (generalized): Secondary | ICD-10-CM | POA: Diagnosis not present

## 2023-03-10 DIAGNOSIS — M24571 Contracture, right ankle: Secondary | ICD-10-CM | POA: Diagnosis not present

## 2023-03-10 DIAGNOSIS — I69354 Hemiplegia and hemiparesis following cerebral infarction affecting left non-dominant side: Secondary | ICD-10-CM | POA: Diagnosis not present

## 2023-03-10 DIAGNOSIS — N186 End stage renal disease: Secondary | ICD-10-CM | POA: Diagnosis not present

## 2023-03-11 DIAGNOSIS — I69354 Hemiplegia and hemiparesis following cerebral infarction affecting left non-dominant side: Secondary | ICD-10-CM | POA: Diagnosis not present

## 2023-03-11 DIAGNOSIS — M6281 Muscle weakness (generalized): Secondary | ICD-10-CM | POA: Diagnosis not present

## 2023-03-11 DIAGNOSIS — Z992 Dependence on renal dialysis: Secondary | ICD-10-CM | POA: Diagnosis not present

## 2023-03-11 DIAGNOSIS — N186 End stage renal disease: Secondary | ICD-10-CM | POA: Diagnosis not present

## 2023-03-11 DIAGNOSIS — M24571 Contracture, right ankle: Secondary | ICD-10-CM | POA: Diagnosis not present

## 2023-03-11 DIAGNOSIS — E118 Type 2 diabetes mellitus with unspecified complications: Secondary | ICD-10-CM | POA: Diagnosis not present

## 2023-03-11 DIAGNOSIS — R278 Other lack of coordination: Secondary | ICD-10-CM | POA: Diagnosis not present

## 2023-03-11 DIAGNOSIS — M24572 Contracture, left ankle: Secondary | ICD-10-CM | POA: Diagnosis not present

## 2023-03-15 ENCOUNTER — Emergency Department (HOSPITAL_COMMUNITY): Payer: Medicare PPO

## 2023-03-15 ENCOUNTER — Emergency Department (HOSPITAL_COMMUNITY)
Admission: EM | Admit: 2023-03-15 | Discharge: 2023-03-16 | Disposition: A | Discharge status: Home / Self Care | Payer: Medicare PPO | Attending: Emergency Medicine | Admitting: Emergency Medicine

## 2023-03-15 DIAGNOSIS — I132 Hypertensive heart and chronic kidney disease with heart failure and with stage 5 chronic kidney disease, or end stage renal disease: Secondary | ICD-10-CM | POA: Diagnosis not present

## 2023-03-15 DIAGNOSIS — T85528A Displacement of other gastrointestinal prosthetic devices, implants and grafts, initial encounter: Secondary | ICD-10-CM | POA: Diagnosis present

## 2023-03-15 DIAGNOSIS — Z8673 Personal history of transient ischemic attack (TIA), and cerebral infarction without residual deficits: Secondary | ICD-10-CM | POA: Diagnosis not present

## 2023-03-15 DIAGNOSIS — F0390 Unspecified dementia without behavioral disturbance: Secondary | ICD-10-CM | POA: Diagnosis not present

## 2023-03-15 DIAGNOSIS — Z992 Dependence on renal dialysis: Secondary | ICD-10-CM | POA: Insufficient documentation

## 2023-03-15 DIAGNOSIS — I509 Heart failure, unspecified: Secondary | ICD-10-CM | POA: Insufficient documentation

## 2023-03-15 DIAGNOSIS — E1122 Type 2 diabetes mellitus with diabetic chronic kidney disease: Secondary | ICD-10-CM | POA: Insufficient documentation

## 2023-03-15 DIAGNOSIS — Y732 Prosthetic and other implants, materials and accessory gastroenterology and urology devices associated with adverse incidents: Secondary | ICD-10-CM | POA: Insufficient documentation

## 2023-03-15 DIAGNOSIS — N186 End stage renal disease: Secondary | ICD-10-CM | POA: Insufficient documentation

## 2023-03-15 DIAGNOSIS — T85598A Other mechanical complication of other gastrointestinal prosthetic devices, implants and grafts, initial encounter: Secondary | ICD-10-CM

## 2023-03-15 LAB — CBG MONITORING, ED: Glucose-Capillary: 114 mg/dL — ABNORMAL HIGH (ref 70–99)

## 2023-03-15 MED ORDER — DIATRIZOATE MEGLUMINE & SODIUM 66-10 % PO SOLN
30.0000 mL | Freq: Once | ORAL | Status: AC
  Administered 2023-03-15: 30 mL
  Filled 2023-03-15: qty 30

## 2023-03-15 MED ORDER — CARVEDILOL 3.125 MG PO TABS
3.1250 mg | ORAL_TABLET | Freq: Two times a day (BID) | ORAL | Status: DC
  Administered 2023-03-15: 3.125 mg
  Filled 2023-03-15: qty 1

## 2023-03-15 MED ORDER — APIXABAN 5 MG PO TABS
5.0000 mg | ORAL_TABLET | Freq: Two times a day (BID) | ORAL | Status: DC
  Administered 2023-03-15: 5 mg
  Filled 2023-03-15: qty 1

## 2023-03-15 NOTE — ED Triage Notes (Signed)
Pt bib [facility] from home where she was receiving her feeding in her peg tube today and a cap got stuck on the feed portion. The tube is starting to split on the side so the daughter/caregiver is requesting a new tube be placed. No complaints at this time. Pt is bedbound at home with daughter

## 2023-03-15 NOTE — ED Notes (Signed)
Ptar called 

## 2023-03-15 NOTE — ED Notes (Signed)
14F G-Tube replaced. Stomach contents return, varied by auscultation

## 2023-03-15 NOTE — Discharge Instructions (Signed)
You were seen today for broken feeding tube. While you were here we monitored your vitals, preformed a physical exam, and replaced your feeding tube. These were all reassuring and there is no indication for any further testing or intervention in the emergency department at this time.   Things to do:  - Follow up with your primary care provider within the next 1-2 weeks  Return to the emergency department if you have any new or worsening symptoms including any other issues with your feeding tube, or if you have any other concerns.

## 2023-03-15 NOTE — ED Provider Notes (Signed)
[place] EMERGENCY DEPARTMENT AT [facility] Provider Note  MDM   HPI/ROS:  Samantha [name] is a 77 y.o. female with a medical history as below who presents for evaluation after her PEG tube broke earlier.  She was receiving a feeding tube and the connector tip at the distal portion broke.  She arrives here via EMS to have it replaced.  There is no other concerns at this time.  Denies any abdominal pain.  There is no erythema surrounding.  Physical exam is notable for: - Malfunctioning/broken PEG tube 20 [place]  On my initial evaluation, patient is:  -Vital signs stable. Patient afebrile, hemodynamically stable, and non-toxic appearing. -Additional history obtained from daughter.   This patient's current presentation, including their history and physical exam, is most consistent with a malfunctioning PEG tube.  No other concerns.  Will plan to replace PEG tube, given the patient's diabetic history and her lack of feeding over the last several hours we will check a capillary glucose.  Dispo likely discharge.  On reevaluation the 50 French tube was exchanged without issue by nursing.  Review of the KUB shows that the G-tube is in the correct position.  Patient's daughter would like [place] to reorder her home meds, and I happily obliged.  Disposition:  I discussed the plan for discharge with the patient and/or their surrogate at bedside prior to discharge and they were in agreement with the plan and verbalized understanding of the return precautions provided. All questions answered to the best of my ability. Ultimately, the patient was discharged in stable condition with stable vital signs. I am reassured that they are capable of close follow up and good social support at home.   Clinical Impression:  1. Feeding tube dysfunction, initial encounter     Rx / DC Orders ED Discharge Orders     None       The plan for this patient was discussed with Dr. Wallace Cullens, who voiced agreement  and who oversaw evaluation and treatment of this patient.   Clinical Complexity A medically appropriate history, review of systems, and physical exam was performed.  My independent interpretations of EKG, labs, and radiology are documented in the ED course above.   If decision rules were used in this patient's evaluation, they are listed below.   Click here for ABCD2, HEART and other calculatorsREFRESH Note before signing   Patient's presentation is most consistent with acute complicated illness / injury requiring diagnostic workup.  Medical Decision Making Amount and/or Complexity of Data Reviewed Independent Historian: caregiver    Details: Daughter at bedside Radiology: ordered and independent interpretation performed. Decision-making details documented in ED Course.  Risk OTC drugs. Prescription drug management. Diagnosis or treatment significantly limited by social determinants of health.    HPI/ROS      See MDM section for pertinent HPI and ROS. A complete ROS was performed with pertinent positives/negatives noted above.   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   Dementia (HCC)    Diabetes mellitus without complication (HCC)    ESRD (end stage renal disease) on dialysis (HCC)    Heart failure (HCC)    HTN (hypertension)    PEG (percutaneous endoscopic gastrostomy) status (HCC)    Renal disorder    Thyroid disease     Past Surgical History:  Procedure Laterality Date   ABDOMINAL HYSTERECTOMY     IR EXCHANGE BILIARY DRAIN  02/11/2023  IR PERC CHOLECYSTOSTOMY  12/16/2022      Physical Exam   Vitals:   03/15/23 1844 03/15/23 2315  BP: (!) 122/55 (!) 157/69  Pulse: 98 90  Resp: 15 18  Temp: 99.1 F (37.3 C) 98.8 F (37.1 C)  TempSrc: Oral Oral  SpO2: 100% 100%    Physical Exam Vitals and nursing note reviewed.  Constitutional:      General: She is not in acute distress.     Appearance: She is well-developed.  HENT:     Head: Normocephalic and atraumatic.  Eyes:     Conjunctiva/sclera: Conjunctivae normal.  Cardiovascular:     Rate and Rhythm: Normal rate and regular rhythm.  Pulmonary:     Effort: Pulmonary effort is normal. No respiratory distress.  Abdominal:     General: The ostomy site is clean.     Tenderness: There is no abdominal tenderness.    Musculoskeletal:        General: No swelling.     Cervical back: Neck supple.  Skin:    General: Skin is warm and dry.     Capillary Refill: Capillary refill takes less than 2 seconds.  Neurological:     Mental Status: She is alert.  Psychiatric:        Mood and Affect: Mood normal.      Procedures   If procedures were preformed on this patient, they are listed below:  Procedures   Fayrene Helper, MD Emergency Medicine PGY-2   Please note that this documentation was produced with the assistance of voice-to-text technology and may contain errors.    Fayrene Helper, MD 03/16/23 0124    Edwin Dada P, DO 03/26/23 1334

## 2023-03-16 ENCOUNTER — Other Ambulatory Visit: Payer: Self-pay

## 2023-03-16 ENCOUNTER — Encounter (HOSPITAL_COMMUNITY): Payer: Self-pay

## 2023-03-16 ENCOUNTER — Emergency Department (HOSPITAL_COMMUNITY)
Admission: EM | Admit: 2023-03-16 | Discharge: 2023-03-17 | Disposition: A | Discharge status: Home / Self Care | Payer: Medicare PPO | Source: Home / Self Care | Attending: Emergency Medicine | Admitting: Emergency Medicine

## 2023-03-16 DIAGNOSIS — I132 Hypertensive heart and chronic kidney disease with heart failure and with stage 5 chronic kidney disease, or end stage renal disease: Secondary | ICD-10-CM | POA: Insufficient documentation

## 2023-03-16 DIAGNOSIS — E1122 Type 2 diabetes mellitus with diabetic chronic kidney disease: Secondary | ICD-10-CM | POA: Insufficient documentation

## 2023-03-16 DIAGNOSIS — F0390 Unspecified dementia without behavioral disturbance: Secondary | ICD-10-CM | POA: Insufficient documentation

## 2023-03-16 DIAGNOSIS — K9423 Gastrostomy malfunction: Secondary | ICD-10-CM | POA: Insufficient documentation

## 2023-03-16 DIAGNOSIS — T85528A Displacement of other gastrointestinal prosthetic devices, implants and grafts, initial encounter: Secondary | ICD-10-CM | POA: Diagnosis not present

## 2023-03-16 DIAGNOSIS — I509 Heart failure, unspecified: Secondary | ICD-10-CM | POA: Insufficient documentation

## 2023-03-16 DIAGNOSIS — N186 End stage renal disease: Secondary | ICD-10-CM | POA: Insufficient documentation

## 2023-03-16 NOTE — ED Triage Notes (Signed)
Pt BIBA from home due to her Peg tube falling out. Was here yesterday, at which time peg tube was replaced. Per EMS, tube fell out when family connected pt to tube feeding.

## 2023-03-17 ENCOUNTER — Emergency Department (HOSPITAL_COMMUNITY): Payer: Medicare PPO

## 2023-03-17 DIAGNOSIS — T85528A Displacement of other gastrointestinal prosthetic devices, implants and grafts, initial encounter: Secondary | ICD-10-CM | POA: Diagnosis not present

## 2023-03-17 LAB — CBG MONITORING, ED
Glucose-Capillary: 133 mg/dL — ABNORMAL HIGH (ref 70–99)
Glucose-Capillary: 154 mg/dL — ABNORMAL HIGH (ref 70–99)

## 2023-03-17 MED ORDER — DIATRIZOATE MEGLUMINE & SODIUM 66-10 % PO SOLN
30.0000 mL | Freq: Once | ORAL | Status: AC
  Administered 2023-03-17: 30 mL
  Filled 2023-03-17: qty 30

## 2023-03-17 MED ORDER — DIATRIZOATE MEGLUMINE & SODIUM 66-10 % PO SOLN
ORAL | Status: AC
  Filled 2023-03-17: qty 30

## 2023-03-17 NOTE — Discharge Instructions (Signed)
You were evaluated in the Emergency Department and after careful evaluation, we did not find any emergent condition requiring admission or further testing in the hospital.  Your exam/testing today was overall reassuring.  G-tube replaced here in the emergency department.  Follow-up with your regular doctors.  Please return to the Emergency Department if you experience any worsening of your condition.  Thank you for allowing [place] to be a part of your care.

## 2023-03-17 NOTE — ED Provider Notes (Signed)
[facility]ENCY DEPT [facility] Emergency Department Provider Note MRN:  [id number]  Arrival date & time: 03/17/23     Chief Complaint   GI Problem   History of Present Illness   Samantha [name] is a 77 y.o. year-old female with a history of stroke, dementia, diabetes, ESRD presenting to the ED with chief complaint of G-tube problem.  G-tube fell out this evening.  Has fallen out 3 times in the past month.  Patient denies any symptoms at this time.  Review of Systems  A thorough review of systems was obtained and all systems are negative except as noted in the HPI and PMH.   Patient's Health History    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   Dementia (HCC)    Diabetes mellitus without complication (HCC)    ESRD (end stage renal disease) on dialysis (HCC)    Heart failure (HCC)    HTN (hypertension)    PEG (percutaneous endoscopic gastrostomy) status (HCC)    Renal disorder    Thyroid disease     Past Surgical History:  Procedure Laterality Date   ABDOMINAL HYSTERECTOMY     IR EXCHANGE BILIARY DRAIN  02/11/2023   IR PERC CHOLECYSTOSTOMY  12/16/2022    History reviewed. No pertinent family history.  Social History   Socioeconomic History   Marital status: Married    Spouse name: Not on file   Number of children: Not on file   Years of education: Not on file   Highest education level: Not on file  Occupational History   Not on file  Tobacco Use   Smoking status: Never   Smokeless tobacco: Not on file  Substance and Sexual Activity   Alcohol use: Not Currently   Drug use: Never   Sexual activity: Not on file  Other Topics Concern   Not on file  Social History Narrative   Not on file   Social Determinants of Health   Financial Resource Strain: Not on file  Food Insecurity: No Food Insecurity (12/12/2022)   Hunger Vital Sign    Worried About Running Out of Food in the Last Year:  Never true    Ran Out of Food in the Last Year: Never true  Transportation Needs: No Transportation Needs (12/12/2022)   PRAPARE - Administrator, Civil Service (Medical): No    Lack of Transportation (Non-Medical): No  Physical Activity: Not on file  Stress: Not on file  Social Connections: Unknown (02/08/2023)   Received from [facility]   Social Network    Social Network: Not on file  Intimate Partner Violence: Unknown (02/08/2023)   Received from [facility]   HITS    Physically Hurt: Not on file    Insult or Talk Down To: Not on file    Threaten Physical Harm: Not on file    Scream or Curse: Not on file     Physical Exam   Vitals:   03/16/23 2317  BP: (!) 145/73  Pulse: 77  Resp: 16  Temp: 98.6 F (37 C)  SpO2: 98%    CONSTITUTIONAL: Well-appearing, NAD NEURO/PSYCH:  Alert and oriented x 3 EYES:  eyes equal and reactive ENT/NECK:  no LAD, no JVD CARDIO: Regular rate, well-perfused, normal S1 and S2 PULM:  CTAB no wheezing or rhonchi GI/GU:  non-distended, non-tender MSK/SPINE:  No gross deformities, no edema SKIN:  no rash, atraumatic   *Additional  and/or pertinent findings included in MDM below  Diagnostic and Interventional Summary    EKG Interpretation Date/Time:    Ventricular Rate:    PR Interval:    QRS Duration:    QT Interval:    QTC Calculation:   R Axis:      Text Interpretation:         Labs Reviewed - No data to display  DG ABDOMEN PEG TUBE LOCATION    (Results Pending)    Medications - No data to display   Procedures  /  Critical Care Gastrostomy tube replacement  Date/Time: 03/17/2023 1:24 AM  Performed by: Sabas Sous, MD Authorized by: Sabas Sous, MD  Consent: Verbal consent obtained. Written consent obtained. Risks and benefits: risks, benefits and alternatives were discussed Consent given by: patient and power of attorney Patient understanding: patient states understanding of the procedure being  performed Patient identity confirmed: verbally with patient Time out: Immediately prior to procedure a "time out" was called to verify the correct patient, procedure, equipment, support staff and site/side marked as required. Local anesthesia used: no  Anesthesia: Local anesthesia used: no  Sedation: Patient sedated: no  Patient tolerance: patient tolerated the procedure well with no immediate complications Comments: 20 French Foley catheter placed temporarily to keep stoma patent.  Then replaced with 20 [place] G-tube when it became available.  No immediate complications.  X-ray to follow.     ED Course and Medical Decision Making  Initial Impression and Ddx Issues with her G-tube recently.  Still waiting for a 20 [place] G-tube from central supply.  I placed a 20 French Foley catheter to keep the stoma patent.  Past medical/surgical history that increases complexity of ED encounter: Stroke  Interpretation of Diagnostics I personally reviewed the abdominal x-ray and my interpretation is as follows: Appropriate G-tube placement.    Patient Reassessment and Ultimate Disposition/Management     Discharge  Patient management required discussion with the following services or consulting groups:  None  Complexity of Problems Addressed Acute complicated illness or Injury  Additional Data Reviewed and Analyzed Further history obtained from: Further history from spouse/family member  Additional Factors Impacting ED Encounter Risk Minor Procedures  Elmer Sow. Pilar Plate, MD [facility] Emergency Medicine [facility] [email or web address]  Final Clinical Impressions(s) / ED Diagnoses     ICD-10-CM   1. Dislodged gastrostomy tube  P32.951O       ED Discharge Orders     None        Discharge Instructions Discussed with and Provided to Patient:   Discharge Instructions   None      Sabas Sous, MD 03/17/23 0127

## 2023-03-17 NOTE — ED Notes (Signed)
Ptar called 

## 2023-03-19 ENCOUNTER — Telehealth: Payer: Self-pay | Admitting: Internal Medicine

## 2023-03-19 NOTE — Telephone Encounter (Signed)
Samantha [name] [phone] called and would like for you to send PT evaluation  to request verbal approval to see a home health physical therapy

## 2023-03-19 NOTE — Telephone Encounter (Signed)
Patient needs to be seen in office,  patient is not est until 03/25/23

## 2023-03-23 ENCOUNTER — Telehealth: Payer: Self-pay | Admitting: Internal Medicine

## 2023-03-23 NOTE — Telephone Encounter (Signed)
[facility] Diannia Ruder [phone]   Sheis aware you can not give these orders until she has her NP appt but wants it documented for that time.  RN asking for  1xWx3 for nursing care

## 2023-03-23 NOTE — Telephone Encounter (Signed)
Noted  

## 2023-03-24 NOTE — Telephone Encounter (Signed)
Patient has appt tomorrow

## 2023-03-25 ENCOUNTER — Encounter: Payer: Self-pay | Admitting: Internal Medicine

## 2023-03-25 ENCOUNTER — Ambulatory Visit (INDEPENDENT_AMBULATORY_CARE_PROVIDER_SITE_OTHER): Payer: Medicare HMO | Admitting: Internal Medicine

## 2023-03-25 VITALS — BP 114/68 | HR 85 | Temp 98.1°F | Ht 59.0 in | Wt 130.0 lb

## 2023-03-25 DIAGNOSIS — E119 Type 2 diabetes mellitus without complications: Secondary | ICD-10-CM | POA: Diagnosis not present

## 2023-03-25 DIAGNOSIS — I639 Cerebral infarction, unspecified: Secondary | ICD-10-CM | POA: Diagnosis not present

## 2023-03-25 DIAGNOSIS — M3119 Other thrombotic microangiopathy: Secondary | ICD-10-CM | POA: Diagnosis not present

## 2023-03-25 DIAGNOSIS — Z794 Long term (current) use of insulin: Secondary | ICD-10-CM

## 2023-03-25 DIAGNOSIS — I10 Essential (primary) hypertension: Secondary | ICD-10-CM

## 2023-03-25 DIAGNOSIS — Z992 Dependence on renal dialysis: Secondary | ICD-10-CM

## 2023-03-25 DIAGNOSIS — G8194 Hemiplegia, unspecified affecting left nondominant side: Secondary | ICD-10-CM

## 2023-03-25 DIAGNOSIS — R627 Adult failure to thrive: Secondary | ICD-10-CM

## 2023-03-25 DIAGNOSIS — N186 End stage renal disease: Secondary | ICD-10-CM | POA: Diagnosis not present

## 2023-03-25 DIAGNOSIS — Z931 Gastrostomy status: Secondary | ICD-10-CM

## 2023-03-25 DIAGNOSIS — K810 Acute cholecystitis: Secondary | ICD-10-CM

## 2023-03-25 DIAGNOSIS — R1310 Dysphagia, unspecified: Secondary | ICD-10-CM

## 2023-03-25 NOTE — Progress Notes (Signed)
[facility] [address] [place] [address] Dept: [phone] Dept Fax: [phone]  New Patient Office Visit  Subjective:   Samantha [name] [birth year] 03/25/2023  Chief Complaint  Patient presents with   Establish Care    HPI: Samantha [name] presents today to establish care at [facility]. Introduced to Publishing rights manager role and practice setting.  All questions answered.  Concerns: See below   Patient presents with her daughter to establish care.  Daughter reports that the patient recently moved from [place] to [place] in April of this year.  On December 12, 2022 patient was admitted to [facility] for acute cholecystitis.  She was evaluated by general surgery, who recommended a cholecystostomy drain instead of gallbladder removal.  Patient was then discharged on May 3 to rehab center [facility], where she has remained until July 19.   She is a dialysis patient, schedule is Monday, Wednesday, Friday.  Daughter reports she has not missed any treatments.  She was followed by nephrology.  She has a history of TTP.  She was on Rituxan in the past, but was then stopped by hematology/oncology in [place].  She does have a appointment with heme-onc on September 26.  Today, she has not had any reoccurrences of this condition, and was considered to be in remission.  She has a history of a CVA with left hemiparesis, dysphagia, and failure to thrive.  [facility] is requesting orders for referral for PT, OT, and speech.  She did receive these therapies while she was in rehab.  She has a PEG tube, which was placed due to failure to thrive as the patient was not eating.  The daughter reports that her eating habits have increased and she is eating more than she was.  She is currently on Osmolite feedings from 9 PM to 5 AM.  Daughter reports the patient was on Nepro, and was wondering why the  recent change in tube feedings after hospitalization.  Tube feedings and PEG tube were managed by GI in [place].  Due to patient's hospitalization, patient has not reestablished outpatient care with GI.  Referral will be placed today.  She has a history of hypertension.  Currently taking amlodipine 10 mg p.o. daily  She is a type II diabetic.  She is currently taking Lantus 16 units once daily and NovoLog sliding scale.   The following portions of the patient's history were reviewed  and updated as appropriate: past medical history, past surgical history, family history, social history, allergies, medications, and problem list.   Patient Active Problem List   Diagnosis Date Noted   Epigastric pain 12/13/2022   End-stage renal disease on hemodialysis (HCC) 12/13/2022   Failure to thrive in adult 12/13/2022   DM2 (diabetes mellitus, type 2) (HCC) 12/13/2022   Essential hypertension 12/13/2022   History of DVT (deep vein thrombosis) 12/13/2022   CVA (cerebral vascular accident) (HCC) 12/12/2022   Acute cholecystitis 12/12/2022   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   Dementia (HCC)    Diabetes mellitus without complication (HCC)    ESRD (end stage renal disease) on dialysis (HCC)    Heart failure (HCC)    HTN (hypertension)    PEG (percutaneous endoscopic gastrostomy) status (HCC)    Renal disorder    Thyroid disease    Past Surgical History:  Procedure  Laterality Date   ABDOMINAL HYSTERECTOMY     IR EXCHANGE BILIARY DRAIN  02/11/2023   IR PERC CHOLECYSTOSTOMY  12/16/2022   History reviewed. No pertinent family history. Outpatient Medications Prior to Visit  Medication Sig Dispense Refill   acetaminophen (TYLENOL) 325 MG tablet Take 2 tablets (650 mg total) by mouth every 6 (six) hours as needed for mild pain (or Fever >/= 101).     apixaban (ELIQUIS) 5 MG TABS tablet Take 1 tablet (5 mg total) by mouth  2 (two) times daily. 60 tablet    bisacodyl (DULCOLAX) 5 MG EC tablet Take 5 mg by mouth daily as needed for moderate constipation.     carvedilol (COREG) 3.125 MG tablet Take 1 tablet (3.125 mg total) by mouth 2 (two) times daily with a meal.     insulin aspart (NOVOLOG) 100 UNIT/ML injection 0-9 Units, Subcutaneous, 3 times daily with meals, CBG < 70: Implement Hypoglycemia measures CBG 70 - 120: 0 units CBG 121 - 150: 1 unit CBG 151 - 200: 2 units CBG 201 - 250: 3 units CBG 251 - 300: 5 units CBG 301 - 350: 7 units CBG 351 - 400: 9 units CBG > 400: call MD 10 mL 11   insulin glargine (LANTUS) 100 UNIT/ML injection Inject 0.16 mLs (16 Units total) into the skin at bedtime. 10 mL 11   multivitamin (RENA-VIT) TABS tablet Take 1 tablet by mouth at bedtime.  0   Nutritional Supplements (FEEDING SUPPLEMENT, OSMOLITE 1.5 CAL,) LIQD Place 237 mLs into feeding tube 3 (three) times daily with meals.  0   escitalopram (LEXAPRO) 5 MG tablet Take 5 mg by mouth. (Patient not taking: Reported on 03/25/2023)     Nutritional Supplements (FEEDING SUPPLEMENT, OSMOLITE 1.5 CAL,) LIQD Place 480 mLs into feeding tube daily. Osmolite 1.5 at 40 mL/hr x 12 hours-start at 6 pm  0   No facility-administered medications prior to visit.   Allergies  Allergen Reactions   Aspirin Hives   Penicillins Hives    ROS: A complete ROS was performed with pertinent positives/negatives noted in the HPI. The remainder of the ROS are negative.   Objective:   Today's Vitals   03/25/23 1516  BP: 114/68  Pulse: 85  Temp: 98.1 F (36.7 C)  TempSrc: Temporal  SpO2: 98%  Weight: 130 lb (59 kg)  Height: 4\' 11"  (1.499 m)    GENERAL: Well-appearing, in NAD. Well nourished.  SKIN: Pink, warm and dry.  NECK: Trachea midline. Full ROM w/o pain or tenderness. No lymphadenopathy.  RESPIRATORY: Chest wall symmetrical.  Hemodialysis port to right upper chest wall. respirations even and non-labored. Breath sounds clear to auscultation  bilaterally.  CARDIAC: S1, S2 present, regular rate and rhythm. Peripheral pulses 2+ bilaterally.  GI: Abdomen soft, non-tender. Normoactive bowel sounds.  Cholecystostomy drain in right upper quadrant, PEG tube in left upper quadrant.  EXTREMITIES: Without clubbing, cyanosis, or edema.  Thin lower extremities.  NEUROLOGIC: Wheelchair-bound.  Left hemiparesis. PSYCH/MENTAL STATUS: Alert, oriented to person. Cooperative, appropriate mood and affect.   Health Maintenance Due  Topic Date Due   Medicare Annual Wellness (AWV)  Never done   Pneumonia Vaccine 29+ Years old (1 of 2 - PCV) Never done   FOOT EXAM  Never done   OPHTHALMOLOGY EXAM  Never done   Hepatitis C Screening  Never done   DTaP/Tdap/Td (1 - Tdap) Never done   Zoster Vaccines- Shingrix (1 of 2) Never done   DEXA SCAN  Never  done   INFLUENZA VACCINE  03/25/2023    Assessment & Plan:  1. Acute cholecystitis -Continue follow-up with general surgery  2. End-stage renal disease on hemodialysis [facility]) -Continue dialysis Monday, Wednesday, Friday  3. TTP (thrombotic thrombocytopenic purpura) (HCC) Continue follow-up with heme-onc as scheduled  4. Cerebrovascular accident (CVA), unspecified mechanism (HCC) - Ambulatory referral to Home Health  5. Left hemiparesis (HCC) - Ambulatory referral to Home Health  6. Dysphagia, unspecified type - Ambulatory referral to Home Health  7. Failure to thrive in adult - Ambulatory referral to Home Health  8. G tube feedings (HCC) - Ambulatory referral to Gastroenterology  9. Essential hypertension - CBC with Differential/Platelet - Comprehensive metabolic panel - Lipid panel - TSH  10. Type 2 diabetes mellitus without complication, with long-term current use of insulin (HCC) - Hemoglobin A1C     Return in about 3 months (around 06/25/2023) for Chronic Condition follow up.   Of note, portions of this note may have been created with voice recognition software Publishing copy). While this note has been edited for accuracy, occasional wrong-word or 'sound-a-like' substitutions may have occurred due to the inherent limitations of voice recognition software.  Salvatore Decent, FNP

## 2023-03-26 ENCOUNTER — Encounter: Payer: Self-pay | Admitting: *Deleted

## 2023-03-29 DIAGNOSIS — M3119 Other thrombotic microangiopathy: Secondary | ICD-10-CM | POA: Insufficient documentation

## 2023-03-30 ENCOUNTER — Telehealth: Payer: Self-pay | Admitting: Internal Medicine

## 2023-03-30 ENCOUNTER — Inpatient Hospital Stay (HOSPITAL_COMMUNITY)
Admission: EM | Admit: 2023-03-30 | Discharge: 2023-04-07 | DRG: 444 | Disposition: A | Discharge status: Home Health | Payer: Medicare HMO | Attending: Family Medicine | Admitting: Family Medicine

## 2023-03-30 DIAGNOSIS — Z992 Dependence on renal dialysis: Secondary | ICD-10-CM

## 2023-03-30 DIAGNOSIS — R509 Fever, unspecified: Secondary | ICD-10-CM

## 2023-03-30 DIAGNOSIS — Z79899 Other long term (current) drug therapy: Secondary | ICD-10-CM

## 2023-03-30 DIAGNOSIS — E1122 Type 2 diabetes mellitus with diabetic chronic kidney disease: Secondary | ICD-10-CM | POA: Diagnosis present

## 2023-03-30 DIAGNOSIS — E119 Type 2 diabetes mellitus without complications: Secondary | ICD-10-CM

## 2023-03-30 DIAGNOSIS — I6350 Cerebral infarction due to unspecified occlusion or stenosis of unspecified cerebral artery: Secondary | ICD-10-CM | POA: Diagnosis present

## 2023-03-30 DIAGNOSIS — K5900 Constipation, unspecified: Secondary | ICD-10-CM | POA: Insufficient documentation

## 2023-03-30 DIAGNOSIS — I5022 Chronic systolic (congestive) heart failure: Secondary | ICD-10-CM | POA: Diagnosis present

## 2023-03-30 DIAGNOSIS — R627 Adult failure to thrive: Secondary | ICD-10-CM | POA: Diagnosis present

## 2023-03-30 DIAGNOSIS — A419 Sepsis, unspecified organism: Principal | ICD-10-CM

## 2023-03-30 DIAGNOSIS — Z90710 Acquired absence of both cervix and uterus: Secondary | ICD-10-CM

## 2023-03-30 DIAGNOSIS — R6510 Systemic inflammatory response syndrome (SIRS) of non-infectious origin without acute organ dysfunction: Secondary | ICD-10-CM

## 2023-03-30 DIAGNOSIS — M3119 Other thrombotic microangiopathy: Secondary | ICD-10-CM | POA: Diagnosis present

## 2023-03-30 DIAGNOSIS — Z880 Allergy status to penicillin: Secondary | ICD-10-CM

## 2023-03-30 DIAGNOSIS — Z794 Long term (current) use of insulin: Secondary | ICD-10-CM

## 2023-03-30 DIAGNOSIS — G309 Alzheimer's disease, unspecified: Secondary | ICD-10-CM | POA: Diagnosis present

## 2023-03-30 DIAGNOSIS — E11649 Type 2 diabetes mellitus with hypoglycemia without coma: Secondary | ICD-10-CM | POA: Diagnosis not present

## 2023-03-30 DIAGNOSIS — I10 Essential (primary) hypertension: Secondary | ICD-10-CM | POA: Diagnosis present

## 2023-03-30 DIAGNOSIS — I2510 Atherosclerotic heart disease of native coronary artery without angina pectoris: Secondary | ICD-10-CM | POA: Diagnosis present

## 2023-03-30 DIAGNOSIS — K5641 Fecal impaction: Secondary | ICD-10-CM | POA: Diagnosis present

## 2023-03-30 DIAGNOSIS — I132 Hypertensive heart and chronic kidney disease with heart failure and with stage 5 chronic kidney disease, or end stage renal disease: Secondary | ICD-10-CM | POA: Diagnosis present

## 2023-03-30 DIAGNOSIS — D631 Anemia in chronic kidney disease: Secondary | ICD-10-CM | POA: Diagnosis present

## 2023-03-30 DIAGNOSIS — R1310 Dysphagia, unspecified: Secondary | ICD-10-CM | POA: Diagnosis present

## 2023-03-30 DIAGNOSIS — K8020 Calculus of gallbladder without cholecystitis without obstruction: Secondary | ICD-10-CM | POA: Diagnosis not present

## 2023-03-30 DIAGNOSIS — Z886 Allergy status to analgesic agent status: Secondary | ICD-10-CM

## 2023-03-30 DIAGNOSIS — Z931 Gastrostomy status: Secondary | ICD-10-CM

## 2023-03-30 DIAGNOSIS — Z8719 Personal history of other diseases of the digestive system: Secondary | ICD-10-CM

## 2023-03-30 DIAGNOSIS — L89152 Pressure ulcer of sacral region, stage 2: Secondary | ICD-10-CM | POA: Diagnosis present

## 2023-03-30 DIAGNOSIS — I69354 Hemiplegia and hemiparesis following cerebral infarction affecting left non-dominant side: Secondary | ICD-10-CM

## 2023-03-30 DIAGNOSIS — J9811 Atelectasis: Secondary | ICD-10-CM | POA: Diagnosis present

## 2023-03-30 DIAGNOSIS — E8889 Other specified metabolic disorders: Secondary | ICD-10-CM | POA: Diagnosis present

## 2023-03-30 DIAGNOSIS — Z95828 Presence of other vascular implants and grafts: Secondary | ICD-10-CM

## 2023-03-30 DIAGNOSIS — J90 Pleural effusion, not elsewhere classified: Secondary | ICD-10-CM | POA: Diagnosis present

## 2023-03-30 DIAGNOSIS — D72829 Elevated white blood cell count, unspecified: Secondary | ICD-10-CM | POA: Diagnosis present

## 2023-03-30 DIAGNOSIS — F0280 Dementia in other diseases classified elsewhere without behavioral disturbance: Secondary | ICD-10-CM | POA: Diagnosis present

## 2023-03-30 DIAGNOSIS — L8990 Pressure ulcer of unspecified site, unspecified stage: Secondary | ICD-10-CM | POA: Insufficient documentation

## 2023-03-30 DIAGNOSIS — Z1152 Encounter for screening for COVID-19: Secondary | ICD-10-CM

## 2023-03-30 DIAGNOSIS — N186 End stage renal disease: Secondary | ICD-10-CM | POA: Diagnosis present

## 2023-03-30 DIAGNOSIS — Z86718 Personal history of other venous thrombosis and embolism: Secondary | ICD-10-CM

## 2023-03-30 DIAGNOSIS — Z7901 Long term (current) use of anticoagulants: Secondary | ICD-10-CM

## 2023-03-30 NOTE — Telephone Encounter (Signed)
Caller Name: Alinah Call back phone #: [phone]  Reason for Call: pts daughter contacted [facility] and was told they do not do G2 feeding tubes. They would like a different office. Please send new info to her mychart.

## 2023-03-30 NOTE — Telephone Encounter (Signed)
Referral was changed to an external location,  patient will receive a call to schedule

## 2023-03-30 NOTE — Addendum Note (Signed)
Addended by: Mary Sella D on: 03/30/2023 02:15 PM   Modules accepted: Orders

## 2023-03-30 NOTE — ED Triage Notes (Signed)
Pt BIB [facility] from home c/o of abd pain with vomiting onset after she had dialysis yesterday. Daughter also reporting temp of 100.7 at home. Pt gets dialysis M/W/F.

## 2023-03-30 NOTE — Telephone Encounter (Signed)
Verbal order approved to continue occupational therapy.  Verbal order approved for speech therapy evaluation Continue to monitor patient's temperature, can give Tylenol if fever is 100.5 or higher.  If patient fever does not resolve and/or her condition worsens at any point in time, patient will need to return to the ER for evaluation.

## 2023-03-30 NOTE — Telephone Encounter (Signed)
HH ORDERS   Caller Name: Rosanne Ashing, OT Home Health Agency Name: [facility] Callback Phone #: [phone]  Service Requested: requesting VO to continue OT 1 week 8 Requesting VO for ST eval  Temp today 99.9 axillary on left side due to pt being lethargic. Then took oral temp of 99.3. Concerned that pt has low grade fever.

## 2023-03-31 ENCOUNTER — Other Ambulatory Visit: Payer: Self-pay

## 2023-03-31 ENCOUNTER — Emergency Department (HOSPITAL_COMMUNITY): Payer: Medicare HMO

## 2023-03-31 ENCOUNTER — Encounter (HOSPITAL_COMMUNITY): Payer: Self-pay | Admitting: Internal Medicine

## 2023-03-31 DIAGNOSIS — K8020 Calculus of gallbladder without cholecystitis without obstruction: Secondary | ICD-10-CM | POA: Diagnosis present

## 2023-03-31 DIAGNOSIS — I5022 Chronic systolic (congestive) heart failure: Secondary | ICD-10-CM | POA: Diagnosis present

## 2023-03-31 DIAGNOSIS — R627 Adult failure to thrive: Secondary | ICD-10-CM | POA: Diagnosis present

## 2023-03-31 DIAGNOSIS — D72829 Elevated white blood cell count, unspecified: Secondary | ICD-10-CM | POA: Diagnosis present

## 2023-03-31 DIAGNOSIS — Z1152 Encounter for screening for COVID-19: Secondary | ICD-10-CM | POA: Diagnosis not present

## 2023-03-31 DIAGNOSIS — L89152 Pressure ulcer of sacral region, stage 2: Secondary | ICD-10-CM | POA: Diagnosis present

## 2023-03-31 DIAGNOSIS — M3119 Other thrombotic microangiopathy: Secondary | ICD-10-CM | POA: Diagnosis present

## 2023-03-31 DIAGNOSIS — R6510 Systemic inflammatory response syndrome (SIRS) of non-infectious origin without acute organ dysfunction: Secondary | ICD-10-CM | POA: Diagnosis present

## 2023-03-31 DIAGNOSIS — Z931 Gastrostomy status: Secondary | ICD-10-CM | POA: Diagnosis not present

## 2023-03-31 DIAGNOSIS — R509 Fever, unspecified: Secondary | ICD-10-CM

## 2023-03-31 DIAGNOSIS — F0280 Dementia in other diseases classified elsewhere without behavioral disturbance: Secondary | ICD-10-CM | POA: Diagnosis present

## 2023-03-31 DIAGNOSIS — D631 Anemia in chronic kidney disease: Secondary | ICD-10-CM | POA: Diagnosis present

## 2023-03-31 DIAGNOSIS — K5641 Fecal impaction: Secondary | ICD-10-CM | POA: Diagnosis present

## 2023-03-31 DIAGNOSIS — J9811 Atelectasis: Secondary | ICD-10-CM | POA: Diagnosis present

## 2023-03-31 DIAGNOSIS — Z95828 Presence of other vascular implants and grafts: Secondary | ICD-10-CM | POA: Diagnosis not present

## 2023-03-31 DIAGNOSIS — J90 Pleural effusion, not elsewhere classified: Secondary | ICD-10-CM | POA: Diagnosis present

## 2023-03-31 DIAGNOSIS — K5900 Constipation, unspecified: Secondary | ICD-10-CM | POA: Insufficient documentation

## 2023-03-31 DIAGNOSIS — I132 Hypertensive heart and chronic kidney disease with heart failure and with stage 5 chronic kidney disease, or end stage renal disease: Secondary | ICD-10-CM | POA: Diagnosis present

## 2023-03-31 DIAGNOSIS — A419 Sepsis, unspecified organism: Secondary | ICD-10-CM | POA: Diagnosis not present

## 2023-03-31 DIAGNOSIS — N186 End stage renal disease: Secondary | ICD-10-CM | POA: Diagnosis present

## 2023-03-31 DIAGNOSIS — I69354 Hemiplegia and hemiparesis following cerebral infarction affecting left non-dominant side: Secondary | ICD-10-CM | POA: Diagnosis not present

## 2023-03-31 DIAGNOSIS — E11649 Type 2 diabetes mellitus with hypoglycemia without coma: Secondary | ICD-10-CM | POA: Diagnosis not present

## 2023-03-31 DIAGNOSIS — E1122 Type 2 diabetes mellitus with diabetic chronic kidney disease: Secondary | ICD-10-CM | POA: Diagnosis present

## 2023-03-31 DIAGNOSIS — G309 Alzheimer's disease, unspecified: Secondary | ICD-10-CM | POA: Diagnosis present

## 2023-03-31 DIAGNOSIS — Z992 Dependence on renal dialysis: Secondary | ICD-10-CM | POA: Diagnosis not present

## 2023-03-31 DIAGNOSIS — E8889 Other specified metabolic disorders: Secondary | ICD-10-CM | POA: Diagnosis present

## 2023-03-31 DIAGNOSIS — Z794 Long term (current) use of insulin: Secondary | ICD-10-CM | POA: Diagnosis not present

## 2023-03-31 LAB — CBC WITH DIFFERENTIAL/PLATELET
Abs Immature Granulocytes: 0.07 10*3/uL (ref 0.00–0.07)
Basophils Absolute: 0 10*3/uL (ref 0.0–0.1)
Basophils Relative: 0 %
Eosinophils Absolute: 0.1 10*3/uL (ref 0.0–0.5)
Eosinophils Relative: 1 %
HCT: 36.5 % (ref 36.0–46.0)
Hemoglobin: 11.5 g/dL — ABNORMAL LOW (ref 12.0–15.0)
Immature Granulocytes: 1 %
Lymphocytes Relative: 19 %
Lymphs Abs: 2.8 10*3/uL (ref 0.7–4.0)
MCH: 31.4 pg (ref 26.0–34.0)
MCHC: 31.5 g/dL (ref 30.0–36.0)
MCV: 99.7 fL (ref 80.0–100.0)
Monocytes Absolute: 1.4 10*3/uL — ABNORMAL HIGH (ref 0.1–1.0)
Monocytes Relative: 10 %
Neutro Abs: 10.3 10*3/uL — ABNORMAL HIGH (ref 1.7–7.7)
Neutrophils Relative %: 69 %
Platelets: 159 10*3/uL (ref 150–400)
RBC: 3.66 MIL/uL — ABNORMAL LOW (ref 3.87–5.11)
RDW: 14 % (ref 11.5–15.5)
WBC: 14.7 10*3/uL — ABNORMAL HIGH (ref 4.0–10.5)
nRBC: 0 % (ref 0.0–0.2)

## 2023-03-31 LAB — URINALYSIS, W/ REFLEX TO CULTURE (INFECTION SUSPECTED)
Bilirubin Urine: NEGATIVE
Glucose, UA: NEGATIVE mg/dL
Ketones, ur: NEGATIVE mg/dL
Nitrite: NEGATIVE
Protein, ur: 100 mg/dL — AB
RBC / HPF: >50 RBC/hpf (ref 0–5)
Specific Gravity, Urine: 1.015 (ref 1.005–1.030)
pH: 7 (ref 5.0–8.0)

## 2023-03-31 LAB — CBC
HCT: 32.4 % — ABNORMAL LOW (ref 36.0–46.0)
Hemoglobin: 10.4 g/dL — ABNORMAL LOW (ref 12.0–15.0)
MCH: 32.5 pg (ref 26.0–34.0)
MCHC: 32.1 g/dL (ref 30.0–36.0)
MCV: 101.3 fL — ABNORMAL HIGH (ref 80.0–100.0)
Platelets: 138 10*3/uL — ABNORMAL LOW (ref 150–400)
RBC: 3.2 MIL/uL — ABNORMAL LOW (ref 3.87–5.11)
RDW: 14.1 % (ref 11.5–15.5)
WBC: 12 10*3/uL — ABNORMAL HIGH (ref 4.0–10.5)
nRBC: 0 % (ref 0.0–0.2)

## 2023-03-31 LAB — COMPREHENSIVE METABOLIC PANEL
ALT: 18 U/L (ref 0–44)
AST: 22 U/L (ref 15–41)
Albumin: 3.2 g/dL — ABNORMAL LOW (ref 3.5–5.0)
Alkaline Phosphatase: 91 U/L (ref 38–126)
Anion gap: 12 (ref 5–15)
BUN: 24 mg/dL — ABNORMAL HIGH (ref 8–23)
CO2: 29 mmol/L (ref 22–32)
Calcium: 9.1 mg/dL (ref 8.9–10.3)
Chloride: 92 mmol/L — ABNORMAL LOW (ref 98–111)
Creatinine, Ser: 3.19 mg/dL — ABNORMAL HIGH (ref 0.44–1.00)
GFR, Estimated: 15 mL/min — ABNORMAL LOW (ref >60)
Glucose, Bld: 129 mg/dL — ABNORMAL HIGH (ref 70–99)
Potassium: 3.5 mmol/L (ref 3.5–5.1)
Sodium: 133 mmol/L — ABNORMAL LOW (ref 135–145)
Total Bilirubin: 0.6 mg/dL (ref 0.3–1.2)
Total Protein: 6.9 g/dL (ref 6.5–8.1)

## 2023-03-31 LAB — CREATININE, SERUM
Creatinine, Ser: 3.39 mg/dL — ABNORMAL HIGH (ref 0.44–1.00)
GFR, Estimated: 13 mL/min — ABNORMAL LOW (ref >60)

## 2023-03-31 LAB — CULTURE, BLOOD (ROUTINE X 2)
Culture: NO GROWTH
Culture: NO GROWTH
Special Requests: ADEQUATE

## 2023-03-31 LAB — RESP PANEL BY RT-PCR (RSV, FLU A&B, COVID)  RVPGX2
Influenza A by PCR: NEGATIVE
Influenza B by PCR: NEGATIVE
Resp Syncytial Virus by PCR: NEGATIVE
SARS Coronavirus 2 by RT PCR: NEGATIVE

## 2023-03-31 LAB — PROTIME-INR
INR: 2.1 — ABNORMAL HIGH (ref 0.8–1.2)
Prothrombin Time: 23.8 seconds — ABNORMAL HIGH (ref 11.4–15.2)

## 2023-03-31 LAB — I-STAT CG4 LACTIC ACID, ED
Lactic Acid, Venous: 1.5 mmol/L (ref 0.5–1.9)
Lactic Acid, Venous: 1.3 mmol/L (ref 0.5–1.9)

## 2023-03-31 LAB — APTT: aPTT: 41 seconds — ABNORMAL HIGH (ref 24–36)

## 2023-03-31 LAB — GLUCOSE, CAPILLARY: Glucose-Capillary: 105 mg/dL — ABNORMAL HIGH (ref 70–99)

## 2023-03-31 LAB — CBG MONITORING, ED: Glucose-Capillary: 115 mg/dL — ABNORMAL HIGH (ref 70–99)

## 2023-03-31 MED ORDER — VANCOMYCIN HCL 500 MG/100ML IV SOLN
500.0000 mg | INTRAVENOUS | Status: DC
  Filled 2023-03-31: qty 100

## 2023-03-31 MED ORDER — CHLORHEXIDINE GLUCONATE CLOTH 2 % EX PADS
6.0000 | MEDICATED_PAD | Freq: Every day | CUTANEOUS | Status: DC
  Administered 2023-04-01 – 2023-04-07 (×6): 6 via TOPICAL

## 2023-03-31 MED ORDER — INSULIN GLARGINE-YFGN 100 UNIT/ML ~~LOC~~ SOLN
10.0000 [IU] | Freq: Every day | SUBCUTANEOUS | Status: DC
  Administered 2023-03-31: 10 [IU] via SUBCUTANEOUS
  Filled 2023-03-31 (×7): qty 0.1

## 2023-03-31 MED ORDER — ONDANSETRON HCL 4 MG PO TABS
4.0000 mg | ORAL_TABLET | Freq: Four times a day (QID) | ORAL | Status: DC | PRN
  Administered 2023-04-02: 4 mg via ORAL
  Filled 2023-03-31: qty 1

## 2023-03-31 MED ORDER — TRAZODONE HCL 50 MG PO TABS
50.0000 mg | ORAL_TABLET | Freq: Every evening | ORAL | Status: DC | PRN
  Administered 2023-04-03 – 2023-04-04 (×2): 50 mg via ORAL
  Filled 2023-03-31 (×2): qty 1

## 2023-03-31 MED ORDER — BISACODYL 5 MG PO TBEC
5.0000 mg | DELAYED_RELEASE_TABLET | Freq: Two times a day (BID) | ORAL | Status: DC
  Administered 2023-03-31 – 2023-04-07 (×8): 5 mg via ORAL
  Filled 2023-03-31 (×10): qty 1

## 2023-03-31 MED ORDER — SODIUM CHLORIDE 0.9 % IV SOLN
1.0000 g | INTRAVENOUS | Status: DC
  Administered 2023-04-01 – 2023-04-07 (×7): 1 g via INTRAVENOUS
  Filled 2023-03-31 (×8): qty 10

## 2023-03-31 MED ORDER — METRONIDAZOLE 500 MG/100ML IV SOLN
500.0000 mg | Freq: Once | INTRAVENOUS | Status: AC
  Administered 2023-03-31: 500 mg via INTRAVENOUS
  Filled 2023-03-31: qty 100

## 2023-03-31 MED ORDER — SODIUM CHLORIDE 0.9 % IV SOLN
2.0000 g | Freq: Once | INTRAVENOUS | Status: AC
  Administered 2023-03-31: 2 g via INTRAVENOUS
  Filled 2023-03-31: qty 20

## 2023-03-31 MED ORDER — VANCOMYCIN HCL 1250 MG/250ML IV SOLN
1250.0000 mg | Freq: Once | INTRAVENOUS | Status: AC
  Administered 2023-03-31: 1250 mg via INTRAVENOUS
  Filled 2023-03-31: qty 250

## 2023-03-31 MED ORDER — ACETAMINOPHEN 325 MG PO TABS
650.0000 mg | ORAL_TABLET | Freq: Four times a day (QID) | ORAL | Status: DC | PRN
  Administered 2023-04-01: 650 mg via ORAL
  Filled 2023-03-31: qty 2

## 2023-03-31 MED ORDER — METOPROLOL TARTRATE 5 MG/5ML IV SOLN: 5.0000 mg | INTRAVENOUS | Status: DC | PRN

## 2023-03-31 MED ORDER — IPRATROPIUM-ALBUTEROL 0.5-2.5 (3) MG/3ML IN SOLN: 3.0000 mL | RESPIRATORY_TRACT | Status: DC | PRN

## 2023-03-31 MED ORDER — SODIUM CHLORIDE 0.9 % IV BOLUS (SEPSIS)
1000.0000 mL | Freq: Once | INTRAVENOUS | Status: AC
  Administered 2023-03-31: 1000 mL via INTRAVENOUS

## 2023-03-31 MED ORDER — ONDANSETRON HCL 4 MG/2ML IJ SOLN
4.0000 mg | Freq: Four times a day (QID) | INTRAMUSCULAR | Status: DC | PRN
  Administered 2023-04-04 – 2023-04-06 (×2): 4 mg via INTRAVENOUS
  Filled 2023-03-31 (×2): qty 2

## 2023-03-31 MED ORDER — ACETAMINOPHEN 650 MG RE SUPP: 650.0000 mg | Freq: Four times a day (QID) | RECTAL | Status: DC | PRN

## 2023-03-31 MED ORDER — APIXABAN 5 MG PO TABS
5.0000 mg | ORAL_TABLET | Freq: Two times a day (BID) | ORAL | Status: DC
  Administered 2023-03-31 – 2023-04-02 (×4): 5 mg
  Filled 2023-03-31 (×4): qty 1

## 2023-03-31 MED ORDER — METRONIDAZOLE 500 MG/100ML IV SOLN
500.0000 mg | Freq: Two times a day (BID) | INTRAVENOUS | Status: DC
  Administered 2023-03-31 – 2023-04-03 (×6): 500 mg via INTRAVENOUS
  Filled 2023-03-31 (×6): qty 100

## 2023-03-31 MED ORDER — SENNOSIDES-DOCUSATE SODIUM 8.6-50 MG PO TABS: 1.0000 | ORAL_TABLET | Freq: Every evening | ORAL | Status: DC | PRN

## 2023-03-31 MED ORDER — CARVEDILOL 3.125 MG PO TABS
3.1250 mg | ORAL_TABLET | Freq: Two times a day (BID) | ORAL | Status: DC
  Administered 2023-04-02 – 2023-04-06 (×9): 3.125 mg via ORAL
  Filled 2023-03-31 (×12): qty 1

## 2023-03-31 MED ORDER — HYDRALAZINE HCL 20 MG/ML IJ SOLN: 10.0000 mg | INTRAMUSCULAR | Status: DC | PRN

## 2023-03-31 MED ORDER — INSULIN ASPART 100 UNIT/ML IJ SOLN: 0.0000 [IU] | Freq: Three times a day (TID) | INTRAMUSCULAR | Status: DC

## 2023-03-31 NOTE — Consult Note (Signed)
Renal Service Consult Note [facility]  Samantha [name] 03/31/2023 Samantha Krabbe, MD Requesting Physician: Dr. Nelson [name]  Reason for Consult: ESRD pt w/ abd pain HPI: The patient is a 77 y.o. year-old w/ PMH as below who presented to ED w/ abdominal pain. In ED pt had fever and ^wbc. CT abd pelvis showed constipation/ rectal stool burden. COIVD/ flu/ rsv were negative. IV abx started empirically. Pt was admitted. We are asked to see for esrd.   Pt seen in ED. Pt is very poor historian. Pt had acute chole in April 2024 rx'd with chole tube. Also has PEG tube due to prior stroke and swallowing issues.   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 Social History  reports that she has never smoked. She does not have any smokeless tobacco history on file. She reports that she does not currently use alcohol. She reports that she does not use drugs. Allergies  Allergies  Allergen Reactions   Aspirin Anaphylaxis   Penicillins Anaphylaxis    **Tolerates cephalosporins   Home medications Prior to Admission medications   Medication Sig Start Date End Date Taking? Authorizing Provider  acetaminophen (TYLENOL) 500 MG tablet Place 500 mg into feeding tube every 6 (six) hours as needed for mild pain.   Yes [provider]  apixaban (ELIQUIS) 5 MG TABS tablet Take 1 tablet (5 mg total) by  mouth 2 (two) times daily. Patient taking differently: Place 5 mg into feeding tube 2 (two) times daily. 12/25/22  Yes [name], Samantha Lean, MD  bisacodyl (DULCOLAX) 5 MG EC tablet Take 5 mg by mouth daily as needed for moderate constipation.   Yes [provider]  carvedilol (COREG) 3.125 MG tablet Take 1 tablet (3.125 mg total) by mouth 2 (two) times daily with a meal. 12/25/22  Yes [name], Samantha Lean, MD  insulin aspart (NOVOLOG) 100 UNIT/ML injection 0-9 Units, Subcutaneous, 3 times daily with meals, CBG < 70: Implement Hypoglycemia measures CBG 70 - 120: 0 units CBG 121 - 150: 1 unit CBG 151 - 200: 2 units CBG 201 - 250: 3 units CBG 251 - 300: 5 units CBG 301 - 350: 7 units CBG 351 - 400: 9 units CBG > 400: call MD 12/25/22  Yes [name], Samantha Lean, MD  insulin glargine (LANTUS) 100 UNIT/ML injection Inject 0.16 mLs (16 Units total) into the skin at bedtime. 12/25/22  Yes [name], Samantha Lean, MD  multivitamin (RENA-VIT) TABS tablet Take 1 tablet by mouth at bedtime. 12/25/22  Yes [name], Samantha Lean, MD  Nutritional Supplements (FEEDING SUPPLEMENT, OSMOLITE 1.5 CAL,) LIQD Place 237 mLs into feeding tube 3 (three) times daily with meals. 12/25/22  Yes [name], Samantha Lean, MD  escitalopram (LEXAPRO) 5 MG tablet Take 5 mg by mouth. Patient not taking: Reported on 03/25/2023  [provider]     Vitals:   03/31/23 1030 03/31/23 1115 03/31/23 1119 03/31/23 1215  BP: (!) 122/56 (!) 126/55  (!) 126/55  Pulse:    81  Resp: (!) 22 18  16   Temp:   98.8 F (37.1 C) 98.6 F (37 C)  TempSrc:   Oral Oral  SpO2:    97%  Weight:       Exam Gen awake, not oriented, knows her name, no idea of location/ year No rash, cyanosis or gangrene Sclera anicteric, throat clear  No jvd or bruits Chest clear bilat to bases, no rales/ wheezing RRR no RG Abd soft ntnd no mass or ascites +bs, LUQ peg tube GU foley in place w/ leg place draining clear urine MS no joint effusions or deformity Ext no LE or UE  edema, no wounds or ulcers Neuro as above, nonfocal    RIJ TDC intact    Home meds include - tylenol, eliquis, bisacodyl, carvedilol 3.125 bid, insulin aspart/ glargine, renavite, osmolite tube feeds, escitalopram     OP HD: MWF SW  3h   350/600   59kg   2/2 bath  TDC   Heparin 2500+ 1500 midrun - last OP HD 8/05, post wt 56.2kg   - no esa, no vdra - last Hb 12.2, pth 179   CXR 8/07 - bibasilar atx    Assessment/ Plan: SIRS/ fever/ Samantha [name] - unclear source, sig constipation per abd CT, but chole tube appeared to be in proper position. Blood cx's pending on IV abx. Per pmd.  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 Using [facility]. Pt's labs and vol look okay. Will plan HD tomorrow off schedule.  HTN/ volume - euvolemic on exam, CXR w/o edema, on RA. BP's wnl.  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 Follow.  MBD ckd - CCa in range, will add on phos.  No vdra at op unit.  H/o CVA - w/ subsequent PEG tube H/o dementia      Rob   MD [facility] 03/31/2023, 1:29 PM  Recent Labs  Lab 03/25/23 1604 03/31/23 0050 03/31/23 0915  HGB 11.9* 11.5* 10.4*  ALBUMIN 3.9 3.2*  --   CALCIUM 9.5 9.1  --   CREATININE 2.78* 3.19* 3.39*  K 4.0 3.5  --    Inpatient medications:  apixaban  5 mg Per Tube BID   bisacodyl  5 mg Oral BID   carvedilol  3.125 mg Oral BID WC   insulin aspart  0-6 Units Subcutaneous TID WC   insulin glargine-yfgn  10 Units Subcutaneous QHS    [START ON 04/01/2023] cefTRIAXone (ROCEPHIN)  IV     metronidazole     [START ON 04/02/2023] vancomycin     acetaminophen **OR** acetaminophen, hydrALAZINE, ipratropium-albuterol, metoprolol tartrate, ondansetron **OR** ondansetron (ZOFRAN) IV, senna-docusate, traZODone

## 2023-03-31 NOTE — ED Notes (Signed)
In and out cath done for UA collection.

## 2023-03-31 NOTE — Evaluation (Signed)
Clinical/Bedside Swallow Evaluation Patient Details  Name: Samantha [name] MRN: [id number] Date of Birth: September 06, 1945  Today's Date: 03/31/2023 Time: SLP Start Time (ACUTE ONLY): 1455 SLP Stop Time (ACUTE ONLY): 1515 SLP Time Calculation (min) (ACUTE ONLY): 20 min  Past Medical History:  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   Dementia (HCC)    Diabetes mellitus without complication (HCC)    ESRD (end stage renal disease) on dialysis (HCC)    Heart failure (HCC)    HTN (hypertension)    PEG (percutaneous endoscopic gastrostomy) status (HCC)    Thyroid disease    Past Surgical History:  Past Surgical History:  Procedure Laterality Date   ABDOMINAL HYSTERECTOMY     IR EXCHANGE BILIARY DRAIN  02/11/2023   IR PERC CHOLECYSTOSTOMY  12/16/2022   HPI:  Samantha [name] is a 77 yo female presenting to ED 8/7 with abdominal pain and intermittent fevers. Noted to have SIRS with fever and leukocytosis. CT Abdomen Pelvis with constipation/rectal stool burden. Recently admitted in April 2024 with acute cholecystitis 2/2 cholethiasis, was deemed a high risk pt, and a cholecystomy was placed. PEG placed in 2021 after residual deficits from prior CVA and failure to thrive. Seen by SLP for swallowing evaluation 12/15/22 with no indication of ongoing dysphagia and was recommended to continue regular diet with thin liquids. PMH includes systolic CHF EF 40%, ESRD on HD, CVA with residual L sided hemiparesis, dysphagia with PEG tube feeding, insulin dependent DM2    Assessment / Plan / Recommendation  Clinical Impression  Pt intermittently able to answer questions accurately, although daughter present in room to confirm baseline. Pt's daughter reports PEG was placed in 2021 and since then, pt has been trying to achieve adequate oral intake. At baseline, pt consumes diet of small, bite-sized pieces. She also reports behavioral aspect of  dysphagia as pt frequently expectorates food. She receives meds orally given in applesauce or via PEG. Oral motor exam with L sided weakness and missing dentition. No overt s/s of aspiration noted throughout trials of thin liquids, purees, and solids. SLP provided teaspoon of purees which pt reported did not taste good and she expectorated bolus from her oral cavity. Overall, pt does not present with any ongoing signs of dysphagia. Suspect pt is at her baseline level of function with swallowing. Recommend Dys 3 diet with thin liquids as that most closely resembles baseline diet. No further SLP f/u is necessary at this time. Will s/o. SLP Visit Diagnosis: Dysphagia, unspecified (R13.10)    Aspiration Risk  Mild aspiration risk    Diet Recommendation Dysphagia 3 (Mech soft);Thin liquid    Liquid Administration via: Cup;Straw Medication Administration: Whole meds with puree Supervision: Staff to assist with self feeding;Full supervision/cueing for compensatory strategies Compensations: Slow rate;Small sips/bites Postural Changes: Seated upright at 90 degrees;Remain upright for at least 30 minutes after po intake    Other  Recommendations Oral Care Recommendations: Oral care BID    Recommendations for follow up therapy are one component of a multi-disciplinary discharge planning process, led by the attending physician.  Recommendations may be updated based on patient status, additional functional criteria and insurance authorization.  Follow up Recommendations No SLP follow up      Assistance Recommended at Discharge    Functional Status Assessment Patient has not had a recent decline in their functional status  Frequency and Duration  Prognosis Prognosis for improved oropharyngeal function: Good      Swallow Study   General HPI: Samantha [name] is a 77 yo female presenting to ED 8/7 with abdominal pain and intermittent fevers. Noted to have SIRS with fever and leukocytosis.  CT Abdomen Pelvis with constipation/rectal stool burden. Recently admitted in April 2024 with acute cholecystitis 2/2 cholethiasis, was deemed a high risk pt, and a cholecystomy was placed. PEG placed in 2021 after residual deficits from prior CVA and failure to thrive. Seen by SLP for swallowing evaluation 12/15/22 with no indication of ongoing dysphagia and was recommended to continue regular diet with thin liquids. PMH includes systolic CHF EF 40%, ESRD on HD, CVA with residual L sided hemiparesis, dysphagia with PEG tube feeding, insulin dependent DM2 Type of Study: Bedside Swallow Evaluation Previous Swallow Assessment: see HPI Diet Prior to this Study: NPO Temperature Spikes Noted: Yes Respiratory Status: Room air History of Recent Intubation: No Behavior/Cognition: Alert;Cooperative;Pleasant mood Oral Cavity Assessment: Within Functional Limits Oral Care Completed by SLP: No Oral Cavity - Dentition: Missing dentition;Poor condition Vision: Functional for self-feeding Self-Feeding Abilities: Total assist Patient Positioning: Upright in bed Baseline Vocal Quality: Normal Volitional Cough: Weak Volitional Swallow: Able to elicit    Oral/Motor/Sensory Function Overall Oral Motor/Sensory Function: Mild impairment Facial ROM: Reduced left;Suspected CN VII (facial) dysfunction Facial Symmetry: Abnormal symmetry left;Suspected CN VII (facial) dysfunction Facial Strength: Reduced left;Suspected CN VII (facial) dysfunction Lingual ROM: Reduced left;Suspected CN XII (hypoglossal) dysfunction Lingual Symmetry: Abnormal symmetry left;Suspected CN XII (hypoglossal) dysfunction Lingual Strength: Suspected CN XII (hypoglossal) dysfunction   Ice Chips Ice chips: Not tested   Thin Liquid Thin Liquid: Within functional limits Presentation: Straw    Nectar Thick Nectar Thick Liquid: Not tested   Honey Thick Honey Thick Liquid: Not tested   Puree Puree: Impaired Presentation: Spoon Oral Phase  Functional Implications: Oral holding   Solid     Solid: Within functional limits      Gwynneth Aliment, M.A., CF-SLP Speech Language Pathology, Acute Rehabilitation Services  Secure Chat preferred [phone]  03/31/2023,3:58 PM

## 2023-03-31 NOTE — ED Notes (Signed)
BG 115

## 2023-03-31 NOTE — Progress Notes (Signed)
Referring Physician(s): Leone Haven  Supervising Physician: Marliss Coots  Patient Status:  [facility] - In-pt  Chief Complaint:  Fever; abd pain x few days  Subjective:  Chole drain placed in IR 12/16/22 Exchange 02/11/23 Wbc 12; afeb COVID/flu/RSV were negative   CT today:  IMPRESSION: No acute findings in the abdomen or pelvis. Stable large stool burden in the rectum concerning for fecal impaction. Cholelithiasis. Stable cholecystostomy tube position within the decompressed gallbladder. Small right pleural effusion with compressive atelectasis in the right lower lobe. Coronary artery disease.  ED MD request for drain injection for evaluation  Allergies: Aspirin and Penicillins  Medications: Prior to Admission medications   Medication Sig Start Date End Date Taking? Authorizing Provider  acetaminophen (TYLENOL) 500 MG tablet Place 500 mg into feeding tube every 6 (six) hours as needed for mild pain.   Yes [provider]  apixaban (ELIQUIS) 5 MG TABS tablet Take 1 tablet (5 mg total) by mouth 2 (two) times daily. Patient taking differently: Place 5 mg into feeding tube 2 (two) times daily. 12/25/22  Yes Ghimire, Werner Lean, MD  bisacodyl (DULCOLAX) 5 MG EC tablet Take 5 mg by mouth daily as needed for moderate constipation.   Yes [provider]  carvedilol (COREG) 3.125 MG tablet Take 1 tablet (3.125 mg total) by mouth 2 (two) times daily with a meal. 12/25/22  Yes Ghimire, Werner Lean, MD  insulin aspart (NOVOLOG) 100 UNIT/ML injection 0-9 Units, Subcutaneous, 3 times daily with meals, CBG < 70: Implement Hypoglycemia measures CBG 70 - 120: 0 units CBG 121 - 150: 1 unit CBG 151 - 200: 2 units CBG 201 - 250: 3 units CBG 251 - 300: 5 units CBG 301 - 350: 7 units CBG 351 - 400: 9 units CBG > 400: call MD 12/25/22  Yes Ghimire, Werner Lean, MD  insulin glargine (LANTUS) 100 UNIT/ML injection Inject 0.16 mLs (16 Units total) into the skin at bedtime. 12/25/22  Yes Ghimire,  Werner Lean, MD  multivitamin (RENA-VIT) TABS tablet Take 1 tablet by mouth at bedtime. 12/25/22  Yes Ghimire, Werner Lean, MD  Nutritional Supplements (FEEDING SUPPLEMENT, OSMOLITE 1.5 CAL,) LIQD Place 237 mLs into feeding tube 3 (three) times daily with meals. 12/25/22  Yes Ghimire, Werner Lean, MD  escitalopram (LEXAPRO) 5 MG tablet Take 5 mg by mouth. Patient not taking: Reported on 03/25/2023    [provider]     Vital Signs: BP (!) 126/55   Pulse 87   Temp 98.8 F (37.1 C) (Oral)   Resp 18   Wt 123 lb 12.8 oz (56.2 kg)   SpO2 96%   BMI 25.00 kg/m   Physical Exam Vitals reviewed.  Pulmonary:     Breath sounds: No wheezing.  Abdominal:     General: There is no distension.     Palpations: Abdomen is soft.     Tenderness: There is no abdominal tenderness.  Skin:    General: Skin is warm.     Comments: Site of chole drain is clean and dry No leaking NT no bleeding No sign of infection  Drain aspirates and flushes well No pain; no hesitation OP is cloudy yellow  Neurological:     Mental Status: She is alert.     Imaging: CT ABDOMEN PELVIS WO CONTRAST  Result Date: 03/31/2023 CLINICAL DATA:  Bowel obstruction suspected Sepsis Abdominal pain, acute, nonlocalized. Vomiting. EXAM: CT ABDOMEN AND PELVIS WITHOUT CONTRAST TECHNIQUE: Multidetector CT imaging of the abdomen and pelvis  was performed following the standard protocol without IV contrast. RADIATION DOSE REDUCTION: This exam was performed according to the departmental dose-optimization program which includes automated exposure control, adjustment of the mA and/or kV according to patient size and/or use of iterative reconstruction technique. COMPARISON:  02/20/2023 FINDINGS: Lower chest: Small right pleural effusion. Compressive atelectasis in the right lower lobe. Dialysis catheter in the right heart. Scattered coronary artery calcifications. Hepatobiliary: Cholecystostomy tube remains in the gallbladder which is  decompressed. Partially calcified 9 mm gallstone again noted. No biliary ductal dilatation. Pancreas: No focal abnormality or ductal dilatation. Spleen: No focal abnormality.  Normal size. Adrenals/Urinary Tract: Adrenal glands normal. Stable cyst in the upper pole of the right kidney for which no additional imaging is recommended. No stones or hydronephrosis. Urinary bladder unremarkable. Stomach/Bowel: Large stool burden in the rectum concerning for fecal impaction. This is similar to prior study. Appendix is normal. Gastrostomy tube is within the stomach. No evidence of bowel obstruction. Vascular/Lymphatic: No evidence of aneurysm or adenopathy. IVC filter remains in stable position in the infrarenal IVC. Reproductive: Prior hysterectomy.  No adnexal masses. Other: No free fluid or free air. Musculoskeletal: No acute bony abnormality. IMPRESSION: No acute findings in the abdomen or pelvis. Stable large stool burden in the rectum concerning for fecal impaction. Cholelithiasis. Stable cholecystostomy tube position within the decompressed gallbladder. Small right pleural effusion with compressive atelectasis in the right lower lobe. Coronary artery disease. Electronically Signed   By: Charlett Nose M.D.   On: 03/31/2023 01:43   DG Chest Port 1 View  Result Date: 03/31/2023 CLINICAL DATA:  Sepsis EXAM: PORTABLE CHEST 1 VIEW COMPARISON:  12/12/2022 FINDINGS: Stable elevation of the right hemidiaphragm. Minimal bibasilar atelectasis. Lungs are otherwise clear. No pneumothorax or pleural effusion. Right upper extremity HERO graft tip is again seen within the deep right atrium in the expected location of the tricuspid valve plane. Cardiac size is at the upper limits of normal. Pulmonary vascularity is normal. No acute bone abnormality. IMPRESSION: 1. Minimal bibasilar atelectasis. Electronically Signed   By: Helyn Numbers M.D.   On: 03/31/2023 00:29    Labs:  CBC: Recent Labs    02/20/23 0244 03/25/23 1604  03/31/23 0050 03/31/23 0915  WBC 7.3 7.2 14.7* 12.0*  HGB 12.2 11.9* 11.5* 10.4*  HCT 36.3 37.1 36.5 32.4*  PLT 143* 161.0 159 138*    COAGS: Recent Labs    12/13/22 0146 12/13/22 0845 12/16/22 0249 12/17/22 0428 12/18/22 0602 03/31/23 0050  INR 1.5*  --   --   --   --  2.1*  APTT  --    < > 97* 69* 65* 41*   < > = values in this interval not displayed.    BMP: Recent Labs    12/25/22 0900 02/20/23 0244 03/25/23 1604 03/31/23 0050 03/31/23 0915  NA 135 131* 138 133*  --   K 4.4 3.5 4.0 3.5  --   CL 95* 98 95* 92*  --   CO2 29 26 35* 29  --   GLUCOSE 235* 123* 97 129*  --   BUN 26* 10 14 24*  --   CALCIUM 9.0 9.3 9.5 9.1  --   CREATININE 3.88* 2.03* 2.78* 3.19* 3.39*  GFRNONAA 11* 25*  --  15* 13*    LIVER FUNCTION TESTS: Recent Labs    12/14/22 0501 12/15/22 1204 12/25/22 0900 02/20/23 0244 03/25/23 1604 03/31/23 0050  BILITOT 0.3  --   --  0.4 0.3 0.6  AST 27  --   --  36 24 22  ALT 23  --   --  34 19 18  ALKPHOS 47  --   --  86 90 91  PROT 6.2*  --   --  6.6 6.7 6.9  ALBUMIN 3.2*   < > 3.0* 3.4* 3.9 3.2*   < > = values in this interval not displayed.    Drain Location: RUQ Size: Fr size: 10 Fr Date of placement: 12/16/22; exchanged 02/11/23  Currently to: Drain collection device: gravity 24 hour output:  Output by Drain (mL) 03/29/23 0701 - 03/29/23 1900 03/29/23 1901 - 03/30/23 0700 03/30/23 0701 - 03/30/23 1900 03/30/23 1901 - 03/31/23 0700 03/31/23 0701 - 03/31/23 1153  Requested LDAs do not have output data documented.    Interval imaging/drain manipulation:  CT today: IMPRESSION: No acute findings in the abdomen or pelvis. Stable large stool burden in the rectum concerning for fecal impaction. Cholelithiasis. Stable cholecystostomy tube position within the decompressed gallbladder.  Small right pleural effusion with compressive atelectasis in the right lower lobe. Coronary artery disease.  Current examination: Flushes/aspirates  easily.  Insertion site unremarkable. Suture and stat lock in place. Dressed appropriately.  OP cloudy yellow  Assessment and Plan:  Cholecystostomy drain intact Flushes and aspirates easily OP cloudy yellow Site is clean and dry NT  No sign of infection Imaging revealing good position of drain Discussed with Dr Elby Showers Will cancel drain injection--- chole drain doing well  Electronically Signed: Robet Leu, PA-C 03/31/2023, 11:53 AM   I spent a total of 15 Minutes at the the patient's bedside AND on the patient's hospital floor or unit, greater than 50% of which was counseling/coordinating care for chole drain evaluation

## 2023-03-31 NOTE — ED Notes (Signed)
ED TO INPATIENT HANDOFF REPORT  ED Nurse Name and Phone #: Beatris Ship RN [phone]  S Name/Age/Gender Samantha [name] 77 y.o. female Room/Bed: 047C/047C  Code Status   Code Status: Full Code  Home/SNF/Other Home Patient oriented to: self, place, time, and situation Is this baseline? Yes   Triage Complete: Triage complete  Chief Complaint Fever [R50.9]  Triage Note Pt BIB [facility] from home c/o of abd pain with vomiting onset after she had dialysis yesterday. Daughter also reporting temp of 100.7 at home. Pt gets dialysis M/W/F.    Allergies Allergies  Allergen Reactions   Aspirin Anaphylaxis   Penicillins Anaphylaxis    **Tolerates cephalosporins    Level of Care/Admitting Diagnosis ED Disposition     ED Disposition  Admit   Condition  --   Comment  Hospital Area: [facility] [[id number]]  Level of Care: Telemetry Medical [104]  May admit patient to [facility] or [facility] if equivalent level of care is available:: Yes  Covid Evaluation: Confirmed COVID Negative  Diagnosis: Fever [344092]  Admitting Physician: Stephania Fragmin [facility] [[id number]]  Attending Physician: Stephania Fragmin [facility] [[id number]]  Certification:: I certify this patient will need inpatient services for at least 2 midnights  Estimated Length of Stay: 2          B Medical/Surgery History 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   Dementia (HCC)    Diabetes mellitus without complication (HCC)    ESRD (end stage renal disease) on dialysis (HCC)    Heart failure (HCC)    HTN (hypertension)    PEG (percutaneous endoscopic gastrostomy) status (HCC)    Thyroid disease    Past Surgical History:  Procedure Laterality Date   ABDOMINAL HYSTERECTOMY     IR EXCHANGE BILIARY DRAIN  02/11/2023   IR PERC CHOLECYSTOSTOMY  12/16/2022     A IV Location/Drains/Wounds Patient Lines/Drains/Airways Status     Active  Line/Drains/Airways     Name Placement date Placement time Site Days   Peripheral IV 03/31/23 22 G Right;Posterior Forearm 03/31/23  0054  Forearm   less than 1   Hemodialysis Catheter Right Internal jugular --  --  Internal jugular  --   Biliary Tube Cook slip-coat 10.2 Fr. RUQ 02/11/23  1455  RUQ  48   Gastrostomy/Enterostomy LLQ 12/16/22  1441  LLQ  105            Intake/Output Last 24 hours  Intake/Output Summary (Last 24 hours) at 03/31/2023 1551 Last data filed at 03/31/2023 1134 Gross per 24 hour  Intake 250 ml  Output --  Net 250 ml    Labs/Imaging Results for orders placed or performed during the hospital encounter of 03/30/23 (from the past 48 hour(s))  Resp panel by RT-PCR (RSV, Flu A&B, Covid) Anterior Nasal Swab     Status: None   Collection Time: 03/31/23 12:50 AM   Specimen: Anterior Nasal Swab  Result Value Ref Range   SARS Coronavirus 2 by RT PCR NEGATIVE NEGATIVE   Influenza A by PCR NEGATIVE NEGATIVE   Influenza B by PCR NEGATIVE NEGATIVE    Comment: (NOTE) The Xpert Xpress SARS-CoV-2/FLU/RSV plus assay is intended as an aid in the diagnosis of influenza from Nasopharyngeal swab specimens and should not be used as a sole basis for treatment. Nasal washings and aspirates are unacceptable for Xpert Xpress SARS-CoV-2/FLU/RSV testing.  Fact Sheet for Patients: BloggerCourse.com  Fact  Sheet for Healthcare Providers: SeriousBroker.it  This test is not yet approved or cleared by the [place] and has been authorized for detection and/or diagnosis of SARS-CoV-2 by FDA under an Emergency Use Authorization (EUA). This EUA will remain in effect (meaning this test can be used) for the duration of the COVID-19 declaration under Section 564(b)(1) of the Act, 21 U.S.C. section 360bbb-3(b)(1), unless the authorization is terminated or revoked.     Resp Syncytial Virus by PCR NEGATIVE NEGATIVE    Comment:  (NOTE) Fact Sheet for Patients: BloggerCourse.com  Fact Sheet for Healthcare Providers: SeriousBroker.it  This test is not yet approved or cleared by the [place] FDA and has been authorized for detection and/or diagnosis of SARS-CoV-2 by FDA under an Emergency Use Authorization (EUA). This EUA will remain in effect (meaning this test can be used) for the duration of the COVID-19 declaration under Section 564(b)(1) of the Act, 21 U.S.C. section 360bbb-3(b)(1), unless the authorization is terminated or revoked.  Performed at [facility], [address] [place], [place] [address]   Comprehensive metabolic panel     Status: Abnormal   Collection Time: 03/31/23 12:50 AM  Result Value Ref Range   Sodium 133 (L) 135 - 145 mmol/L   Potassium 3.5 3.5 - 5.1 mmol/L   Chloride 92 (L) 98 - 111 mmol/L   CO2 29 22 - 32 mmol/L   Glucose, Bld 129 (H) 70 - 99 mg/dL    Comment: Glucose reference range applies only to samples taken after fasting for at least 8 hours.   BUN 24 (H) 8 - 23 mg/dL   Creatinine, Ser 6.04 (H) 0.44 - 1.00 mg/dL   Calcium 9.1 8.9 - 54.0 mg/dL   Total Protein 6.9 6.5 - 8.1 g/dL   Albumin 3.2 (L) 3.5 - 5.0 g/dL   AST 22 15 - 41 U/L   ALT 18 0 - 44 U/L   Alkaline Phosphatase 91 38 - 126 U/L   Total Bilirubin 0.6 0.3 - 1.2 mg/dL   GFR, Estimated 15 (L) >60 mL/min    Comment: (NOTE) Calculated using the CKD-EPI Creatinine Equation (2021)    Anion gap 12 5 - 15    Comment: Performed at [facility], [address] [place], [place] [address]  CBC with Differential     Status: Abnormal   Collection Time: 03/31/23 12:50 AM  Result Value Ref Range   WBC 14.7 (H) 4.0 - 10.5 K/uL   RBC 3.66 (L) 3.87 - 5.11 MIL/uL   Hemoglobin 11.5 (L) 12.0 - 15.0 g/dL   HCT 14.7 82.9 - 56.2 %   MCV 99.7 80.0 - 100.0 fL   MCH 31.4 26.0 - 34.0 pg   MCHC 31.5 30.0 - 36.0 g/dL   RDW 13.0 86.5 - 78.4 %   Platelets 159 150 -  400 K/uL   nRBC 0.0 0.0 - 0.2 %   Neutrophils Relative % 69 %   Neutro Abs 10.3 (H) 1.7 - 7.7 K/uL   Lymphocytes Relative 19 %   Lymphs Abs 2.8 0.7 - 4.0 K/uL   Monocytes Relative 10 %   Monocytes Absolute 1.4 (H) 0.1 - 1.0 K/uL   Eosinophils Relative 1 %   Eosinophils Absolute 0.1 0.0 - 0.5 K/uL   Basophils Relative 0 %   Basophils Absolute 0.0 0.0 - 0.1 K/uL   Immature Granulocytes 1 %   Abs Immature Granulocytes 0.07 0.00 - 0.07 K/uL    Comment: Performed  at [facility], [address] [place], [place] [address]  Protime-INR     Status: Abnormal   Collection Time: 03/31/23 12:50 AM  Result Value Ref Range   Prothrombin Time 23.8 (H) 11.4 - 15.2 seconds   INR 2.1 (H) 0.8 - 1.2    Comment: (NOTE) INR goal varies based on device and disease states. Performed at [facility], [address] [place], [place] [address]   APTT     Status: Abnormal   Collection Time: 03/31/23 12:50 AM  Result Value Ref Range   aPTT 41 (H) 24 - 36 seconds    Comment:        IF BASELINE aPTT IS ELEVATED, SUGGEST PATIENT RISK ASSESSMENT BE USED TO DETERMINE APPROPRIATE ANTICOAGULANT THERAPY. Performed at [facility], [address], [place], [place] [address]   Blood Culture (routine x 2)     Status: None (Preliminary result)   Collection Time: 03/31/23 12:50 AM   Specimen: BLOOD RIGHT ARM  Result Value Ref Range   Specimen Description BLOOD RIGHT ARM    Special Requests      BOTTLES DRAWN AEROBIC AND ANAEROBIC Blood Culture adequate volume   Culture      NO GROWTH < 12 HOURS Performed at [facility], [address] [place], [place] [address]    Report Status PENDING   I-Stat Lactic Acid, ED     Status: None   Collection Time: 03/31/23  1:03 AM  Result Value Ref Range   Lactic Acid, Venous 1.5 0.5 - 1.9 mmol/L  Blood Culture (routine x 2)     Status: None (Preliminary result)   Collection Time: 03/31/23  1:19 AM   Specimen: BLOOD RIGHT HAND  Result Value  Ref Range   Specimen Description BLOOD RIGHT HAND    Special Requests      BOTTLES DRAWN AEROBIC ONLY Blood Culture results may not be optimal due to an inadequate volume of blood received in culture bottles   Culture      NO GROWTH < 12 HOURS Performed at [facility], [address], [place], [place] [address]    Report Status PENDING   I-Stat Lactic Acid, ED     Status: None   Collection Time: 03/31/23  2:28 AM  Result Value Ref Range   Lactic Acid, Venous 1.3 0.5 - 1.9 mmol/L  Urinalysis, w/ Reflex to Culture (Infection Suspected) -Urine, Clean Catch     Status: Abnormal   Collection Time: 03/31/23  2:50 AM  Result Value Ref Range   Specimen Source URINE, CLEAN CATCH    Color, Urine AMBER (A) YELLOW    Comment: BIOCHEMICALS MAY BE AFFECTED BY COLOR   APPearance HAZY (A) CLEAR   Specific Gravity, Urine 1.015 1.005 - 1.030   pH 7.0 5.0 - 8.0   Glucose, UA NEGATIVE NEGATIVE mg/dL   Hgb urine dipstick LARGE (A) NEGATIVE   Bilirubin Urine NEGATIVE NEGATIVE   Ketones, ur NEGATIVE NEGATIVE mg/dL   Protein, ur 027 (A) NEGATIVE mg/dL   Nitrite NEGATIVE NEGATIVE   Leukocytes,Ua TRACE (A) NEGATIVE   RBC / HPF >50 0 - 5 RBC/hpf   WBC, UA 0-5 0 - 5 WBC/hpf    Comment:        Reflex urine culture not performed if WBC <=10, OR if Squamous epithelial cells >5. If Squamous epithelial cells >5 suggest recollection.    Bacteria, UA FEW (A) NONE SEEN   Squamous  Epithelial / HPF 21-50 0 - 5 /HPF   Hyaline Casts, UA PRESENT     Comment: Performed at [facility], [address], [place], [place] [address]  CBC     Status: Abnormal   Collection Time: 03/31/23  9:15 AM  Result Value Ref Range   WBC 12.0 (H) 4.0 - 10.5 K/uL   RBC 3.20 (L) 3.87 - 5.11 MIL/uL   Hemoglobin 10.4 (L) 12.0 - 15.0 g/dL   HCT 22.0 (L) 25.4 - 27.0 %   MCV 101.3 (H) 80.0 - 100.0 fL   MCH 32.5 26.0 - 34.0 pg   MCHC 32.1 30.0 - 36.0 g/dL   RDW 62.3 76.2 - 83.1 %   Platelets 138 (L) 150 - 400 K/uL    nRBC 0.0 0.0 - 0.2 %    Comment: Performed at [facility], [address] [place], [place] [address]  Creatinine, serum     Status: Abnormal   Collection Time: 03/31/23  9:15 AM  Result Value Ref Range   Creatinine, Ser 3.39 (H) 0.44 - 1.00 mg/dL   GFR, Estimated 13 (L) >60 mL/min    Comment: (NOTE) Calculated using the CKD-EPI Creatinine Equation (2021) Performed at [facility], [address] [place], [place] [address]   CBG monitoring, ED     Status: Abnormal   Collection Time: 03/31/23 11:48 AM  Result Value Ref Range   Glucose-Capillary 115 (H) 70 - 99 mg/dL    Comment: Glucose reference range applies only to samples taken after fasting for at least 8 hours.   Comment 1 QC Due    CT ABDOMEN PELVIS WO CONTRAST  Result Date: 03/31/2023 CLINICAL DATA:  Bowel obstruction suspected Sepsis Abdominal pain, acute, nonlocalized. Vomiting. EXAM: CT ABDOMEN AND PELVIS WITHOUT CONTRAST TECHNIQUE: Multidetector CT imaging of the abdomen and pelvis was performed following the standard protocol without IV contrast. RADIATION DOSE REDUCTION: This exam was performed according to the departmental dose-optimization program which includes automated exposure control, adjustment of the mA and/or kV according to patient size and/or use of iterative reconstruction technique. COMPARISON:  02/20/2023 FINDINGS: Lower chest: Small right pleural effusion. Compressive atelectasis in the right lower lobe. Dialysis catheter in the right heart. Scattered coronary artery calcifications. Hepatobiliary: Cholecystostomy tube remains in the gallbladder which is decompressed. Partially calcified 9 mm gallstone again noted. No biliary ductal dilatation. Pancreas: No focal abnormality or ductal dilatation. Spleen: No focal abnormality.  Normal size. Adrenals/Urinary Tract: Adrenal glands normal. Stable cyst in the upper pole of the right kidney for which no additional imaging is recommended. No stones or  hydronephrosis. Urinary bladder unremarkable. Stomach/Bowel: Large stool burden in the rectum concerning for fecal impaction. This is similar to prior study. Appendix is normal. Gastrostomy tube is within the stomach. No evidence of bowel obstruction. Vascular/Lymphatic: No evidence of aneurysm or adenopathy. IVC filter remains in stable position in the infrarenal IVC. Reproductive: Prior hysterectomy.  No adnexal masses. Other: No free fluid or free air. Musculoskeletal: No acute bony abnormality. IMPRESSION: No acute findings in the abdomen or pelvis. Stable large stool burden in the rectum concerning for fecal impaction. Cholelithiasis. Stable cholecystostomy tube position within the decompressed gallbladder. Small right pleural effusion with compressive atelectasis in the right lower lobe. Coronary artery disease. Electronically Signed   By: Charlett Nose M.D.   On: 03/31/2023 01:43   DG Chest Port 1 View  Result Date: 03/31/2023 CLINICAL DATA:  Sepsis EXAM: PORTABLE CHEST 1 VIEW COMPARISON:  12/12/2022 FINDINGS: Stable elevation of the right hemidiaphragm. Minimal bibasilar atelectasis. Lungs are otherwise clear. No pneumothorax or pleural effusion. Right upper extremity HERO graft tip is again seen within the deep right atrium in the expected location of the tricuspid valve plane. Cardiac size is at the upper limits of normal. Pulmonary vascularity is normal. No acute bone abnormality. IMPRESSION: 1. Minimal bibasilar atelectasis. Electronically Signed   By: Helyn Numbers M.D.   On: 03/31/2023 00:29    Pending Labs Unresulted Labs (From admission, onward)     Start     Ordered   04/01/23 0500  CBC  Tomorrow morning,   R        03/31/23 0919   04/01/23 0500  Comprehensive metabolic panel  Daily,   R      03/31/23 0919   04/01/23 0500  Magnesium  Tomorrow morning,   R        03/31/23 0919   04/01/23 0500  Phosphorus  Tomorrow morning,   R        03/31/23 0919   03/31/23 1342  Hepatitis B surface  antigen  (New Admission Hemo Labs (Hepatitis B))  Once,   R        03/31/23 1345   03/31/23 1342  Hepatitis B surface antibody,quantitative  (New Admission [facility] (Hepatitis B))  Once,   R        03/31/23 1345            Vitals/Pain Today's Vitals   03/31/23 1400 03/31/23 1430 03/31/23 1500 03/31/23 1515  BP: (!) 117/56 105/72 (!) 106/47 112/61  Pulse:    83  Resp: 18 (!) 23 15 18   Temp:      TempSrc:      SpO2:    98%  Weight:      PainSc:        Isolation Precautions No active isolations  Medications Medications  carvedilol (COREG) tablet 3.125 mg (has no administration in time range)  bisacodyl (DULCOLAX) EC tablet 5 mg (0 mg Oral Hold 03/31/23 1119)  apixaban (ELIQUIS) tablet 5 mg (has no administration in time range)  insulin glargine-yfgn (SEMGLEE) injection 10 Units (has no administration in time range)  insulin aspart (novoLOG) injection 0-6 Units ( Subcutaneous Not Given 03/31/23 1150)  acetaminophen (TYLENOL) tablet 650 mg (has no administration in time range)    Or  acetaminophen (TYLENOL) suppository 650 mg (has no administration in time range)  ondansetron (ZOFRAN) tablet 4 mg (has no administration in time range)    Or  ondansetron (ZOFRAN) injection 4 mg (has no administration in time range)  ipratropium-albuterol (DUONEB) 0.5-2.5 (3) MG/3ML nebulizer solution 3 mL (has no administration in time range)  senna-docusate (Senokot-S) tablet 1 tablet (has no administration in time range)  traZODone (DESYREL) tablet 50 mg (has no administration in time range)  hydrALAZINE (APRESOLINE) injection 10 mg (has no administration in time range)  metoprolol tartrate (LOPRESSOR) injection 5 mg (has no administration in time range)  cefTRIAXone (ROCEPHIN) 1 g in sodium chloride 0.9 % 100 mL IVPB (has no administration in time range)  metroNIDAZOLE (FLAGYL) IVPB 500 mg (0 mg Intravenous Stopped 03/31/23 1511)  vancomycin (VANCOREADY) IVPB 500 mg/100 mL (has no administration  in time range)  Chlorhexidine Gluconate Cloth 2 % PADS 6 each (has no administration in time range)  cefTRIAXone (ROCEPHIN) 2 g in sodium chloride 0.9 % 100 mL IVPB (0 g Intravenous Stopped 03/31/23 0221)  metroNIDAZOLE (FLAGYL) IVPB 500 mg (0 mg  Intravenous Stopped 03/31/23 0334)  sodium chloride 0.9 % bolus 1,000 mL (0 mLs Intravenous Stopped 03/31/23 0416)  vancomycin (VANCOREADY) IVPB 1250 mg/250 mL (0 mg Intravenous Stopped 03/31/23 1134)    Mobility walks      R Recommendations: See Admitting Provider Note  Report given to: [id number]

## 2023-03-31 NOTE — Sepsis Progress Note (Signed)
Elink monitoring for the code sepsis protocol.  

## 2023-03-31 NOTE — ED Provider Notes (Signed)
[facility] Emergency Department Provider Note MRN:  [id number]  Arrival date & time: 03/31/23     Chief Complaint   Abdominal Pain and Vomiting   History of Present Illness   Samantha [name] is a 77 y.o. year-old female with a history of ESRD, stroke presenting to the ED with chief complaint of abdominal pain.  Domino pain for the past 2 or 3 days, nausea vomiting, fever today.  Patient largely nonverbal, history of stroke.  History obtained from daughter at bedside.  Review of Systems  I was unable to obtain a full/accurate HPI, PMH, or ROS due to the patient's history of stroke.  Patient's Health History    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   Dementia (HCC)    Diabetes mellitus without complication (HCC)    ESRD (end stage renal disease) on dialysis (HCC)    Heart failure (HCC)    HTN (hypertension)    PEG (percutaneous endoscopic gastrostomy) status (HCC)    Renal disorder    Thyroid disease     Past Surgical History:  Procedure Laterality Date   ABDOMINAL HYSTERECTOMY     IR EXCHANGE BILIARY DRAIN  02/11/2023   IR PERC CHOLECYSTOSTOMY  12/16/2022    No family history on file.  Social History   Socioeconomic History   Marital status: Single    Spouse name: Not on file   Number of children: Not on file   Years of education: Not on file   Highest education level: Not on file  Occupational History   Not on file  Tobacco Use   Smoking status: Never   Smokeless tobacco: Not on file  Substance and Sexual Activity   Alcohol use: Not Currently   Drug use: Never   Sexual activity: Not on file  Other Topics Concern   Not on file  Social History Narrative   Not on file   Social Determinants of Health   Financial Resource Strain: Not on file  Food Insecurity: No Food Insecurity (12/12/2022)   Hunger Vital Sign    Worried About Running Out of Food in the Last  Year: Never true    Ran Out of Food in the Last Year: Never true  Transportation Needs: No Transportation Needs (12/12/2022)   PRAPARE - Administrator, Civil Service (Medical): No    Lack of Transportation (Non-Medical): No  Physical Activity: Not on file  Stress: Not on file  Social Connections: Unknown (02/08/2023)   Received from [facility], [facility]   Social Network    Social Network: Not on file  Intimate Partner Violence: Unknown (02/08/2023)   Received from [facility], [facility]   HITS    Physically Hurt: Not on file    Insult or Talk Down To: Not on file    Threaten Physical Harm: Not on file    Scream or Curse: Not on file     Physical Exam   Vitals:   03/31/23 0600 03/31/23 0700  BP: (!) 120/49 (!) 117/44  Pulse:    Resp: 18 16  Temp:    SpO2:      CONSTITUTIONAL: Well-appearing, NAD NEURO/PSYCH: Awake and alert, nonverbal EYES:  eyes equal and reactive ENT/NECK:  no LAD, no JVD CARDIO: Regular rate, well-perfused, normal S1 and S2 PULM:  CTAB no wheezing or rhonchi GI/GU:  non-distended, non-tender MSK/SPINE:  No gross deformities, no edema SKIN:  no rash, atraumatic   *Additional and/or pertinent findings included in MDM below  Diagnostic and Interventional Summary    EKG Interpretation Date/Time:  Tuesday March 30 2023 23:46:21 EDT Ventricular Rate:  96 PR Interval:  156 QRS Duration:  94 QT Interval:  381 QTC Calculation: 482 R Axis:   -68  Text Interpretation: Sinus rhythm Left anterior fascicular block Low voltage, precordial leads Consider RVH w/ secondary repol abnormality Consider anterior infarct Nonspecific repol abnormality, lateral leads ST elevation, consider inferior injury Confirmed by Kennis Carina [phone]) on 03/31/2023 12:09:25 AM       Labs Reviewed  COMPREHENSIVE METABOLIC PANEL - Abnormal; Notable for the following components:      Result Value   Sodium 133 (*)    Chloride 92 (*)    Glucose, Bld 129  (*)    BUN 24 (*)    Creatinine, Ser 3.19 (*)    Albumin 3.2 (*)    GFR, Estimated 15 (*)    All other components within normal limits  CBC WITH DIFFERENTIAL/PLATELET - Abnormal; Notable for the following components:   WBC 14.7 (*)    RBC 3.66 (*)    Hemoglobin 11.5 (*)    Neutro Abs 10.3 (*)    Monocytes Absolute 1.4 (*)    All other components within normal limits  PROTIME-INR - Abnormal; Notable for the following components:   Prothrombin Time 23.8 (*)    INR 2.1 (*)    All other components within normal limits  APTT - Abnormal; Notable for the following components:   aPTT 41 (*)    All other components within normal limits  URINALYSIS, W/ REFLEX TO CULTURE (INFECTION SUSPECTED) - Abnormal; Notable for the following components:   Color, Urine AMBER (*)    APPearance HAZY (*)    Hgb urine dipstick LARGE (*)    Protein, ur 100 (*)    Leukocytes,Ua TRACE (*)    Bacteria, UA FEW (*)    All other components within normal limits  RESP PANEL BY RT-PCR (RSV, FLU A&B, COVID)  RVPGX2  CULTURE, BLOOD (ROUTINE X 2)  CULTURE, BLOOD (ROUTINE X 2)  I-STAT CG4 LACTIC ACID, ED  I-STAT CG4 LACTIC ACID, ED    CT ABDOMEN PELVIS WO CONTRAST  Final Result    DG Chest Port 1 View  Final Result      Medications  cefTRIAXone (ROCEPHIN) 2 g in sodium chloride 0.9 % 100 mL IVPB (0 g Intravenous Stopped 03/31/23 0221)  metroNIDAZOLE (FLAGYL) IVPB 500 mg (0 mg Intravenous Stopped 03/31/23 0334)  sodium chloride 0.9 % bolus 1,000 mL (0 mLs Intravenous Stopped 03/31/23 0416)     Procedures  /  Critical Care .Critical Care  Performed by: Sabas Sous, MD Authorized by: Sabas Sous, MD   Critical care provider statement:    Critical care time (minutes):  35   Critical care was necessary to treat or prevent imminent or life-threatening deterioration of the following conditions:  Sepsis   Critical care was time spent personally by me on the following activities:  Development of treatment plan  with patient or surrogate, discussions with consultants, evaluation of patient's response to treatment, examination of patient, ordering and review of laboratory studies, ordering and review of radiographic studies, ordering and performing treatments and interventions, pulse oximetry, re-evaluation of patient's condition and review of old charts   ED Course and Medical Decision Making  Initial Impression and Ddx Patient arrives with fever and heart rate 96, abdominal pain  with nausea vomiting, decreased bowel movements.  2 SIRS criteria with possible intra-abdominal source.  Code sepsis initiated.  Providing fluids and antibiotics.  Will need CT imaging of the abdomen.  Mild diffuse tenderness.  No other infectious symptoms.  Past medical/surgical history that increases complexity of ED encounter: Stroke, ESRD  Interpretation of Diagnostics I personally reviewed the EKG and my interpretation is as follows: Sinus rhythm, no significant change from prior  Labs reveal mild leukocytosis, otherwise no significant blood count or electrolyte disturbance  Patient Reassessment and Ultimate Disposition/Management     Admitted to hospitalist service for continued care, fever of unclear source.  CT abdomen overall unremarkable.  Dr. Chales Abrahams of nephrology is aware of the patient and will review the case to determine need for dialysis today.  Patient management required discussion with the following services or consulting groups:  Hospitalist Service and Nephrology  Complexity of Problems Addressed Acute illness or injury that poses threat of life of bodily function  Additional Data Reviewed and Analyzed Further history obtained from: Further history from spouse/family member  Additional Factors Impacting ED Encounter Risk Consideration of hospitalization  Elmer Sow. Pilar Plate, MD [facility] [email or web address]  Final Clinical Impressions(s) / ED  Diagnoses     ICD-10-CM   1. Sepsis, due to unspecified organism, unspecified whether acute organ dysfunction present [facility])  A41.9       ED Discharge Orders     None        Discharge Instructions Discussed with and Provided to Patient:   Discharge Instructions   None      Sabas Sous, MD 03/31/23 [phone]

## 2023-03-31 NOTE — H&P (Signed)
History and Physical    Samantha [name] OAC:[id number] DOB: 05-09-1946 DOA: 03/30/2023  PCP: Salvatore Decent, FNP Patient coming from: Home  Chief Complaint: Fever  HPI: Samantha [name] is a 77 y.o. female with medical history significant of acute cholecystitis with cholelithiasis status post laparoscopic cholecystostomy placement in April 2024, systolic CHF EF 40%, ESRD on HD, CVA with left-sided hemiparesis, dysphagia with PEG tube feeding, insulin-dependent DM2 comes to the hospital with fever.  Patient is overall a poor historian therefore I spoke with the patient's daughter Samantha [name] According to Goodman patient went to her dialysis on Monday but after that she started having some abdominal pain and intermittent fevers.  This kind of persisted over 24 hours and patient reported of abdominal pain.  Patient was on Tylenol but her symptoms did not subside.  They ended up following with her outpatient nurse practitioner who advised her to come to the ER. In the ED patient was noted to have SIRS with fever and leukocytosis.  UA was unequivocal.  CT abdomen pelvis showed constipation/rectal stool burden.  COVID/flu/RSV were negative.  Empiric antibiotics were started and was determined to admit the patient.  Patient had acute cholecystitis secondary to cholelithiasis back in April 2024 and she was deemed to be high risk patient therefore she has had cholecystostomy in place.  Due to her prior stroke she has had peg tube in place and has hemodialysis catheter.  None of these appears to be infected at this time.   Review of Systems: As per HPI otherwise 10 point review of systems negative.  Review of Systems Otherwise negative except as per HPI, including: General: Denies night sweats or unintended weight loss. Resp: Denies cough, wheezing, shortness of breath. Cardiac: Denies chest pain, palpitations, orthopnea, paroxysmal nocturnal dyspnea. GI: Denies abdominal pain, nausea, vomiting, diarrhea or  constipation GU: Denies dysuria, frequency, hesitancy or incontinence MS: Denies muscle aches, joint pain or swelling Neuro: Denies headache, neurologic deficits (focal weakness, numbness, tingling), abnormal gait Psych: Denies anxiety, depression, SI/HI/AVH Skin: Denies new rashes or lesions ID: Denies sick contacts, exotic exposures, travel  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   Dementia (HCC)    Diabetes mellitus without complication (HCC)    ESRD (end stage renal disease) on dialysis (HCC)    Heart failure (HCC)    HTN (hypertension)    PEG (percutaneous endoscopic gastrostomy) status (HCC)    Renal disorder    Thyroid disease     Past Surgical History:  Procedure Laterality Date   ABDOMINAL HYSTERECTOMY     IR EXCHANGE BILIARY DRAIN  02/11/2023   IR PERC CHOLECYSTOSTOMY  12/16/2022    SOCIAL HISTORY:  reports that she has never smoked. She does not have any smokeless tobacco history on file. She reports that she does not currently use alcohol. She reports that she does not use drugs.  Allergies  Allergen Reactions   Aspirin Anaphylaxis   Penicillins Anaphylaxis    FAMILY HISTORY: No family history on file.   Prior to Admission medications   Medication Sig Start Date End Date Taking? Authorizing Provider  acetaminophen (TYLENOL) 500 MG tablet Place 500 mg into feeding tube every 6 (six) hours as needed for mild pain.   Yes [provider]  apixaban (ELIQUIS) 5 MG TABS tablet Take 1 tablet (5 mg total) by mouth 2 (two) times daily. Patient taking differently: Place 5 mg into feeding tube 2 (two) times  daily. 12/25/22  Yes Ghimire, Werner Lean, MD  bisacodyl (DULCOLAX) 5 MG EC tablet Take 5 mg by mouth daily as needed for moderate constipation.   Yes [provider]  carvedilol (COREG) 3.125 MG tablet Take 1 tablet (3.125 mg total) by mouth 2 (two) times daily with a meal. 12/25/22   Yes Ghimire, Werner Lean, MD  insulin aspart (NOVOLOG) 100 UNIT/ML injection 0-9 Units, Subcutaneous, 3 times daily with meals, CBG < 70: Implement Hypoglycemia measures CBG 70 - 120: 0 units CBG 121 - 150: 1 unit CBG 151 - 200: 2 units CBG 201 - 250: 3 units CBG 251 - 300: 5 units CBG 301 - 350: 7 units CBG 351 - 400: 9 units CBG > 400: call MD 12/25/22  Yes Ghimire, Werner Lean, MD  insulin glargine (LANTUS) 100 UNIT/ML injection Inject 0.16 mLs (16 Units total) into the skin at bedtime. 12/25/22  Yes Ghimire, Werner Lean, MD  multivitamin (RENA-VIT) TABS tablet Take 1 tablet by mouth at bedtime. 12/25/22  Yes Ghimire, Werner Lean, MD  Nutritional Supplements (FEEDING SUPPLEMENT, OSMOLITE 1.5 CAL,) LIQD Place 237 mLs into feeding tube 3 (three) times daily with meals. 12/25/22  Yes Ghimire, Werner Lean, MD  escitalopram (LEXAPRO) 5 MG tablet Take 5 mg by mouth. Patient not taking: Reported on 03/25/2023    [provider]    Physical Exam: Vitals:   03/31/23 0700 03/31/23 0745 03/31/23 0800 03/31/23 0900  BP: (!) 117/44 (!) 113/38 110/64 (!) 120/55  Pulse:      Resp: 16 14 (!) 7 16  Temp:      TempSrc:      SpO2:      Weight:          Constitutional: NAD, calm, comfortable Eyes: PERRL, lids and conjunctivae normal ENMT: Mucous membranes are moist. Posterior pharynx clear of any exudate or lesions.Normal dentition.  Neck: normal, supple, no masses, no thyromegaly Respiratory: clear to auscultation bilaterally, no wheezing, no crackles. Normal respiratory effort. No accessory muscle use.  Cardiovascular: Regular rate and rhythm, no murmurs / rubs / gallops. No extremity edema. 2+ pedal pulses. No carotid bruits.  Abdomen: no tenderness, no masses palpated. No hepatosplenomegaly. Bowel sounds positive.  Musculoskeletal: no clubbing / cyanosis. No joint deformity upper and lower extremities. Good ROM, no contractures. Normal muscle tone.  Skin: no rashes, lesions, ulcers. No induration Neurologic:  No new focal neuro abnormality.  Does have chronic left-sided hemiparesis Psychiatric: Normal judgment and insight. Alert and oriented x 3. Normal mood.   Right upper quadrant cholecystostomy in place PEG tube in place HD catheter noted without any evidence of infection.  Labs on Admission: I have personally reviewed following labs and imaging studies  CBC: Recent Labs  Lab 03/25/23 1604 03/31/23 0050  WBC 7.2 14.7*  NEUTROABS 3.5 10.3*  HGB 11.9* 11.5*  HCT 37.1 36.5  MCV 99.2 99.7  PLT 161.0 159   Basic Metabolic Panel: Recent Labs  Lab 03/25/23 1604 03/31/23 0050  NA 138 133*  K 4.0 3.5  CL 95* 92*  CO2 35* 29  GLUCOSE 97 129*  BUN 14 24*  CREATININE 2.78* 3.19*  CALCIUM 9.5 9.1   GFR: Estimated Creatinine Clearance: 11.5 mL/min (A) (by C-G formula based on SCr of 3.19 mg/dL (H)). Liver Function Tests: Recent Labs  Lab 03/25/23 1604 03/31/23 0050  AST 24 22  ALT 19 18  ALKPHOS 90 91  BILITOT 0.3 0.6  PROT 6.7 6.9  ALBUMIN 3.9 3.2*  No results for input(s): "LIPASE", "AMYLASE" in the last 168 hours. No results for input(s): "AMMONIA" in the last 168 hours. Coagulation Profile: Recent Labs  Lab 03/31/23 0050  INR 2.1*   Cardiac Enzymes: No results for input(s): "CKTOTAL", "CKMB", "CKMBINDEX", "TROPONINI" in the last 168 hours. BNP (last 3 results) No results for input(s): "PROBNP" in the last 8760 hours. HbA1C: No results for input(s): "HGBA1C" in the last 72 hours. CBG: No results for input(s): "GLUCAP" in the last 168 hours. Lipid Profile: No results for input(s): "CHOL", "HDL", "LDLCALC", "TRIG", "CHOLHDL", "LDLDIRECT" in the last 72 hours. Thyroid Function Tests: No results for input(s): "TSH", "T4TOTAL", "FREET4", "T3FREE", "THYROIDAB" in the last 72 hours. Anemia Panel: No results for input(s): "VITAMINB12", "FOLATE", "FERRITIN", "TIBC", "IRON", "RETICCTPCT" in the last 72 hours. Urine analysis:    Component Value Date/Time    COLORURINE AMBER (A) 03/31/2023 0250   APPEARANCEUR HAZY (A) 03/31/2023 0250   LABSPEC 1.015 03/31/2023 0250   PHURINE 7.0 03/31/2023 0250   GLUCOSEU NEGATIVE 03/31/2023 0250   HGBUR LARGE (A) 03/31/2023 0250   BILIRUBINUR NEGATIVE 03/31/2023 0250   KETONESUR NEGATIVE 03/31/2023 0250   PROTEINUR 100 (A) 03/31/2023 0250   NITRITE NEGATIVE 03/31/2023 0250   LEUKOCYTESUR TRACE (A) 03/31/2023 0250   Sepsis Labs: !!!!!!!!!!!!!!!!!!!!!!!!!!!!!!!!!!!!!!!!!!!! @LABRCNTIP (procalcitonin:4,lacticidven:4) ) Recent Results (from the past 240 hour(s))  Resp panel by RT-PCR (RSV, Flu A&B, Covid) Anterior Nasal Swab     Status: None   Collection Time: 03/31/23 12:50 AM   Specimen: Anterior Nasal Swab  Result Value Ref Range Status   SARS Coronavirus 2 by RT PCR NEGATIVE NEGATIVE Final   Influenza A by PCR NEGATIVE NEGATIVE Final   Influenza B by PCR NEGATIVE NEGATIVE Final    Comment: (NOTE) The Xpert Xpress SARS-CoV-2/FLU/RSV plus assay is intended as an aid in the diagnosis of influenza from Nasopharyngeal swab specimens and should not be used as a sole basis for treatment. Nasal washings and aspirates are unacceptable for Xpert Xpress SARS-CoV-2/FLU/RSV testing.  Fact Sheet for Patients: BloggerCourse.com  Fact Sheet for Healthcare Providers: SeriousBroker.it  This test is not yet approved or cleared by the [place] FDA and has been authorized for detection and/or diagnosis of SARS-CoV-2 by FDA under an Emergency Use Authorization (EUA). This EUA will remain in effect (meaning this test can be used) for the duration of the COVID-19 declaration under Section 564(b)(1) of the Act, 21 U.S.C. section 360bbb-3(b)(1), unless the authorization is terminated or revoked.     Resp Syncytial Virus by PCR NEGATIVE NEGATIVE Final    Comment: (NOTE) Fact Sheet for Patients: BloggerCourse.com  Fact Sheet for  Healthcare Providers: SeriousBroker.it  This test is not yet approved or cleared by the [place] FDA and has been authorized for detection and/or diagnosis of SARS-CoV-2 by FDA under an Emergency Use Authorization (EUA). This EUA will remain in effect (meaning this test can be used) for the duration of the COVID-19 declaration under Section 564(b)(1) of the Act, 21 U.S.C. section 360bbb-3(b)(1), unless the authorization is terminated or revoked.  Performed at [facility], [address], [place], [place] [address]   Blood Culture (routine x 2)     Status: None (Preliminary result)   Collection Time: 03/31/23 12:50 AM   Specimen: BLOOD RIGHT ARM  Result Value Ref Range Status   Specimen Description BLOOD RIGHT ARM  Final   Special Requests   Final    BOTTLES DRAWN AEROBIC AND ANAEROBIC Blood Culture adequate volume  Culture   Final    NO GROWTH < 12 HOURS Performed at [facility], [address], [place], [place] [address]    Report Status PENDING  Incomplete  Blood Culture (routine x 2)     Status: None (Preliminary result)   Collection Time: 03/31/23  1:19 AM   Specimen: BLOOD RIGHT HAND  Result Value Ref Range Status   Specimen Description BLOOD RIGHT HAND  Final   Special Requests   Final    BOTTLES DRAWN AEROBIC ONLY Blood Culture results may not be optimal due to an inadequate volume of blood received in culture bottles   Culture   Final    NO GROWTH < 12 HOURS Performed at [facility], [address] [place], [place] [address]    Report Status PENDING  Incomplete     Radiological Exams on Admission: CT ABDOMEN PELVIS WO CONTRAST  Result Date: 03/31/2023 CLINICAL DATA:  Bowel obstruction suspected Sepsis Abdominal pain, acute, nonlocalized. Vomiting. EXAM: CT ABDOMEN AND PELVIS WITHOUT CONTRAST TECHNIQUE: Multidetector CT imaging of the abdomen and pelvis was performed following the standard protocol without IV  contrast. RADIATION DOSE REDUCTION: This exam was performed according to the departmental dose-optimization program which includes automated exposure control, adjustment of the mA and/or kV according to patient size and/or use of iterative reconstruction technique. COMPARISON:  02/20/2023 FINDINGS: Lower chest: Small right pleural effusion. Compressive atelectasis in the right lower lobe. Dialysis catheter in the right heart. Scattered coronary artery calcifications. Hepatobiliary: Cholecystostomy tube remains in the gallbladder which is decompressed. Partially calcified 9 mm gallstone again noted. No biliary ductal dilatation. Pancreas: No focal abnormality or ductal dilatation. Spleen: No focal abnormality.  Normal size. Adrenals/Urinary Tract: Adrenal glands normal. Stable cyst in the upper pole of the right kidney for which no additional imaging is recommended. No stones or hydronephrosis. Urinary bladder unremarkable. Stomach/Bowel: Large stool burden in the rectum concerning for fecal impaction. This is similar to prior study. Appendix is normal. Gastrostomy tube is within the stomach. No evidence of bowel obstruction. Vascular/Lymphatic: No evidence of aneurysm or adenopathy. IVC filter remains in stable position in the infrarenal IVC. Reproductive: Prior hysterectomy.  No adnexal masses. Other: No free fluid or free air. Musculoskeletal: No acute bony abnormality. IMPRESSION: No acute findings in the abdomen or pelvis. Stable large stool burden in the rectum concerning for fecal impaction. Cholelithiasis. Stable cholecystostomy tube position within the decompressed gallbladder. Small right pleural effusion with compressive atelectasis in the right lower lobe. Coronary artery disease. Electronically Signed   By: Charlett Nose M.D.   On: 03/31/2023 01:43   DG Chest Port 1 View  Result Date: 03/31/2023 CLINICAL DATA:  Sepsis EXAM: PORTABLE CHEST 1 VIEW COMPARISON:  12/12/2022 FINDINGS: Stable elevation of the  right hemidiaphragm. Minimal bibasilar atelectasis. Lungs are otherwise clear. No pneumothorax or pleural effusion. Right upper extremity HERO graft tip is again seen within the deep right atrium in the expected location of the tricuspid valve plane. Cardiac size is at the upper limits of normal. Pulmonary vascularity is normal. No acute bone abnormality. IMPRESSION: 1. Minimal bibasilar atelectasis. Electronically Signed   By: Helyn Numbers M.D.   On: 03/31/2023 00:29     All images have been reviewed by me personally.  EKG: Independently reviewed.   Assessment/Plan Principal Problem:   Fever Active Problems:   End-stage renal disease on hemodialysis (HCC)   Failure to thrive in adult   DM2 (diabetes mellitus, type 2) (HCC)  Essential hypertension   History of DVT (deep vein thrombosis)   TTP (thrombotic thrombocytopenic purpura) (HCC)   Alzheimer's disease (HCC)   Cerebrovascular accident (CVA) due to occlusion of cerebral artery (HCC)   Constipation    SIRS, fever & Leukocytosis -Unclear source at this time but she does have cholecystostomy tube, PEG tube in dialysis catheter.  COVID/RSV negative.  UA is unequivocal, culture sent.  Blood cultures negative for now.  On broad-spectrum antibiotics.  COVID, flu, RSV negative.  CT abdomen pelvis shows moderate stool burden but no evidence of colitis.  Constipation, rectal - Change bisacodyl to twice daily.  Tapwater enema.  History of acalculous colitis - Underwent cholecystostomy tube placement in April 2024, follows outpatient IR.  This has been dislodged in between and exchanged.  Currently output looks okay, we will reconsult IR to ensure proper functioning of this.  Drainage at this time does not appear to be infected. -Persistent cholelithiasis on CT scan but tube appears to be stable with decompressed gallbladder  History of CVA with left-sided hemiparesis Dysphagia getting PEG tube - Patient was already on Eliquis.  Getting  nocturnal peg tube feeding at home.  Will consult dietitian here  Insulin-dependent diabetes mellitus type 2 - Sliding scale, Accu-Chek.  Will reduce home long-acting from 16 units to 10 units.  This can slowly be adjusted   ESRD on hemodialysis Monday Wednesday Friday - Last HD session 8/5.  Nephro, Dr. Durenda Guthrie consulted by EDP.  Will defer need for next HD to their service.  Congestive heart failure EF 45% CAD Essential hypertension - Volume status is managed by hemodialysis.  Continue Coreg.  IV as needed  History of DVT - IVC filter in place.  On Eliquis which we will continue  DVT prophylaxis: Eliquis Code Status: full  Family Communication: Spoke with Samantha [name] her daughter Consults called: IR consult placed.  Admission status: Admit inptn Tele  Status is: Inpatient Remains inpatient appropriate because: Admit to Tele, inpatient.    Time Spent: 65 minutes.  >50% of the time was devoted to discussing the patients care, assessment, plan and disposition with other care givers along with counseling the patient about the risks and benefits of treatment.     Joline Maxcy MD [facility]  If 7PM-7AM, please contact night-coverage   03/31/2023, 9:19 AM

## 2023-03-31 NOTE — ED Notes (Signed)
Patient hard stick. Lab to collect 2nd set of blood cultures.

## 2023-03-31 NOTE — Telephone Encounter (Signed)
Research in process. To find location to place tube

## 2023-03-31 NOTE — Progress Notes (Signed)
Pharmacy Antibiotic Note  Samantha [name] is a 77 y.o. female admitted on 03/30/2023 presenting with abdominal pain, hx cholecystostomy, concern for intra-abdominal infection.  Pharmacy has been consulted for vancomycin dosing.  ESRD-HD usually MWF  Plan: Vancomycin 1250 mg IV x 1, then 500 mg IV q HD Monitor HD schedule, Cx and clinical progression to narrow Vancomycin random level as needed  Weight: 56.2 kg (123 lb 12.8 oz)  Temp (24hrs), Avg:100.4 F (38 C), Min:99.2 F (37.3 C), Max:101.6 F (38.7 C)  Recent Labs  Lab 03/25/23 1604 03/31/23 0050 03/31/23 0103 03/31/23 0228  WBC 7.2 14.7*  --   --   CREATININE 2.78* 3.19*  --   --   LATICACIDVEN  --   --  1.5 1.3    Estimated Creatinine Clearance: 11.5 mL/min (A) (by C-G formula based on SCr of 3.19 mg/dL (H)).    Allergies  Allergen Reactions   Aspirin Anaphylaxis   Penicillins Anaphylaxis    **Tolerates cephalosporins    [name], PharmD, [facility] Clinical Pharmacist ED Pharmacist Phone # [phone] 03/31/2023 9:27 AM

## 2023-04-01 ENCOUNTER — Inpatient Hospital Stay (HOSPITAL_COMMUNITY): Payer: Medicare HMO

## 2023-04-01 DIAGNOSIS — A419 Sepsis, unspecified organism: Secondary | ICD-10-CM

## 2023-04-01 HISTORY — DX: Sepsis, unspecified organism: A41.9

## 2023-04-01 LAB — COMPREHENSIVE METABOLIC PANEL
ALT: 19 U/L (ref 0–44)
AST: 25 U/L (ref 15–41)
Albumin: 2.5 g/dL — ABNORMAL LOW (ref 3.5–5.0)
Alkaline Phosphatase: 81 U/L (ref 38–126)
Anion gap: 15 (ref 5–15)
BUN: 37 mg/dL — ABNORMAL HIGH (ref 8–23)
CO2: 25 mmol/L (ref 22–32)
Calcium: 8.3 mg/dL — ABNORMAL LOW (ref 8.9–10.3)
Chloride: 98 mmol/L (ref 98–111)
Creatinine, Ser: 3.76 mg/dL — ABNORMAL HIGH (ref 0.44–1.00)
GFR, Estimated: 12 mL/min — ABNORMAL LOW (ref >60)
Glucose, Bld: 88 mg/dL (ref 70–99)
Potassium: 3.1 mmol/L — ABNORMAL LOW (ref 3.5–5.1)
Sodium: 138 mmol/L (ref 135–145)
Total Bilirubin: 0.4 mg/dL (ref 0.3–1.2)
Total Protein: 5.7 g/dL — ABNORMAL LOW (ref 6.5–8.1)

## 2023-04-01 LAB — PHOSPHORUS: Phosphorus: 1.8 mg/dL — ABNORMAL LOW (ref 2.5–4.6)

## 2023-04-01 LAB — CBC
HCT: 30.6 % — ABNORMAL LOW (ref 36.0–46.0)
Hemoglobin: 9.8 g/dL — ABNORMAL LOW (ref 12.0–15.0)
MCH: 31.3 pg (ref 26.0–34.0)
MCHC: 32 g/dL (ref 30.0–36.0)
MCV: 97.8 fL (ref 80.0–100.0)
Platelets: 131 10*3/uL — ABNORMAL LOW (ref 150–400)
RBC: 3.13 MIL/uL — ABNORMAL LOW (ref 3.87–5.11)
RDW: 14.2 % (ref 11.5–15.5)
WBC: 10.2 10*3/uL (ref 4.0–10.5)
nRBC: 0 % (ref 0.0–0.2)

## 2023-04-01 LAB — MAGNESIUM: Magnesium: 2 mg/dL (ref 1.7–2.4)

## 2023-04-01 LAB — GLUCOSE, CAPILLARY
Glucose-Capillary: 128 mg/dL — ABNORMAL HIGH (ref 70–99)
Glucose-Capillary: 129 mg/dL — ABNORMAL HIGH (ref 70–99)
Glucose-Capillary: 62 mg/dL — ABNORMAL LOW (ref 70–99)
Glucose-Capillary: 68 mg/dL — ABNORMAL LOW (ref 70–99)
Glucose-Capillary: 89 mg/dL (ref 70–99)
Glucose-Capillary: 98 mg/dL (ref 70–99)

## 2023-04-01 LAB — HEPATITIS B SURFACE ANTIGEN: Hepatitis B Surface Ag: NONREACTIVE

## 2023-04-01 MED ORDER — INSULIN ASPART 100 UNIT/ML IJ SOLN
0.0000 [IU] | INTRAMUSCULAR | Status: DC
  Administered 2023-04-02 (×2): 2 [IU] via SUBCUTANEOUS
  Administered 2023-04-02 (×3): 1 [IU] via SUBCUTANEOUS
  Administered 2023-04-03 (×2): 3 [IU] via SUBCUTANEOUS
  Administered 2023-04-03 – 2023-04-04 (×3): 1 [IU] via SUBCUTANEOUS
  Administered 2023-04-04: 2 [IU] via SUBCUTANEOUS
  Administered 2023-04-04 – 2023-04-06 (×6): 1 [IU] via SUBCUTANEOUS
  Administered 2023-04-06: 3 [IU] via SUBCUTANEOUS

## 2023-04-01 MED ORDER — POTASSIUM CHLORIDE 20 MEQ PO PACK: 40.0000 meq | PACK | ORAL | Status: DC

## 2023-04-01 MED ORDER — INSULIN GLARGINE-YFGN 100 UNIT/ML ~~LOC~~ SOLN
5.0000 [IU] | Freq: Every day | SUBCUTANEOUS | Status: DC
  Administered 2023-04-02: 5 [IU] via SUBCUTANEOUS
  Filled 2023-04-01 (×3): qty 0.05

## 2023-04-01 MED ORDER — ENSURE ENLIVE PO LIQD
237.0000 mL | ORAL | Status: DC
  Administered 2023-04-01 – 2023-04-06 (×5): 237 mL via ORAL

## 2023-04-01 MED ORDER — SODIUM CHLORIDE 0.9% FLUSH: 10.0000 mL | INTRAVENOUS | Status: DC | PRN

## 2023-04-01 MED ORDER — VANCOMYCIN HCL 500 MG/100ML IV SOLN: 500.0000 mg | INTRAVENOUS | Status: DC

## 2023-04-01 MED ORDER — DEXTROSE 50 % IV SOLN
INTRAVENOUS | Status: AC
  Administered 2023-04-01: 12.5 g via INTRAVENOUS
  Filled 2023-04-01: qty 50

## 2023-04-01 MED ORDER — VANCOMYCIN HCL 500 MG/100ML IV SOLN
500.0000 mg | INTRAVENOUS | Status: AC
  Administered 2023-04-01: 500 mg via INTRAVENOUS
  Filled 2023-04-01 (×2): qty 100

## 2023-04-01 MED ORDER — POLYETHYLENE GLYCOL 3350 17 G PO PACK
17.0000 g | PACK | Freq: Two times a day (BID) | ORAL | Status: DC
  Administered 2023-04-01 – 2023-04-07 (×7): 17 g
  Filled 2023-04-01 (×8): qty 1

## 2023-04-01 MED ORDER — OSMOLITE 1.5 CAL PO LIQD
1000.0000 mL | ORAL | Status: DC
  Administered 2023-04-01 – 2023-04-02 (×2): 1000 mL
  Filled 2023-04-01 (×2): qty 1000

## 2023-04-01 MED ORDER — HEPARIN SODIUM (PORCINE) 1000 UNIT/ML DIALYSIS
2500.0000 [IU] | Freq: Once | INTRAMUSCULAR | Status: AC
  Administered 2023-04-01: 2500 [IU] via INTRAVENOUS_CENTRAL
  Filled 2023-04-01: qty 3

## 2023-04-01 MED ORDER — K PHOS MONO-SOD PHOS DI & MONO 155-852-130 MG PO TABS: 500.0000 mg | ORAL_TABLET | Freq: Once | ORAL | Status: DC

## 2023-04-01 MED ORDER — SODIUM CHLORIDE 0.9% FLUSH
10.0000 mL | Freq: Two times a day (BID) | INTRAVENOUS | Status: DC
  Administered 2023-04-01 – 2023-04-06 (×9): 10 mL

## 2023-04-01 MED ORDER — HEPARIN SODIUM (PORCINE) 1000 UNIT/ML DIALYSIS
1500.0000 [IU] | INTRAMUSCULAR | Status: AC | PRN
  Administered 2023-04-01: 1500 [IU] via INTRAVENOUS_CENTRAL
  Filled 2023-04-01: qty 2

## 2023-04-01 MED ORDER — PROSOURCE TF20 ENFIT COMPATIBL EN LIQD
60.0000 mL | Freq: Every day | ENTERAL | Status: DC
  Administered 2023-04-01 – 2023-04-07 (×7): 60 mL
  Filled 2023-04-01 (×6): qty 60

## 2023-04-01 MED ORDER — K PHOS MONO-SOD PHOS DI & MONO 155-852-130 MG PO TABS
500.0000 mg | ORAL_TABLET | Freq: Once | ORAL | Status: AC
  Administered 2023-04-01: 500 mg
  Filled 2023-04-01: qty 2

## 2023-04-01 MED ORDER — HEPARIN SODIUM (PORCINE) 1000 UNIT/ML IJ SOLN
INTRAMUSCULAR | Status: AC
  Administered 2023-04-01: 4500 [IU]
  Filled 2023-04-01: qty 1

## 2023-04-01 NOTE — Progress Notes (Deleted)
    Loyola Mast, RN

## 2023-04-01 NOTE — Progress Notes (Addendum)
TR[facility] night cross cover note:   I was notified by RN that patient's serum potassium level this morning is 3.1 relative to 3.5 yesterday.  Additionally, her serum phosphorus level is low at 1.8.  This is an end-stage renal disease patient on hemodialysis, with next scheduled hemodialysis to occur tomorrow, 04/02/2023.  I subsequently placed order for 2 tabs of K-Phos to be administered now per tube via PEG.    Newton Pigg, DO Hospitalist

## 2023-04-01 NOTE — Progress Notes (Signed)
Initial Nutrition Assessment  DOCUMENTATION CODES:   Not applicable  INTERVENTION:  Vanilla Ensure Plus High Protein po BID, each supplement provides 350 kcal and 20 grams of protein.  Initiate TF's via PEG  Osmolite 1.5 @ 40 ml/hr (960 ml/day)  Prosource TF20 daily  Start at 20 ml/hr and advance by 10 ml q6 hrs to goal rate  -TF's at goal rate provides 1520 kcal, 80 g protein, 730 ml fluid   MD to provide water flush   Replace electrolytes as needed   NUTRITION DIAGNOSIS:   Altered GI function related to constipation as evidenced by other (comment) (CT abdomen results).   GOAL:   Patient will meet greater than or equal to 90% of their needs   MONITOR:   PO intake, Supplement acceptance, Labs, Weight trends, Skin, I & O's  REASON FOR ASSESSMENT:   Consult Enteral/tube feeding initiation and management, Assessment of nutrition requirement/status  ASSESSMENT:   77 y.o. female with PMHx including acute cholecystitis with cholelithiasis s/p lap cholecystostomy placement in 11/2022, sCHF EF 40%, ESRD on HD, CVA with Left side hemiparesis, dysphagia with PEG tube, T2DM, presents with fever  Patient familiar to nutrition services   Workup in ED showed SIRS with fever and increased white count  CT abdomen showed stool burden    Visited patient at bedside who reports she has a good appetite and was getting continuous tube feeds along with po diet PTA. She was unable to tell what formula she normally takes. Per chart review on previus visits to [facility], patient was getting Osmolite 1.5.   Labs: K+ 3.1, BUN 37, Cr 3.76, phos 1.8 Meds: dulcolax, rocephin, insulin, glafyl, miralax, NS, vancoready   Wt: 2.8 kg (5%) wt loss x 5 days?  03/30/23 56.2 kg  03/25/23 59 kg  03/17/23 59 kg  02/18/23 59 kg     PO: 0-25% x 2 documented meal   I/O's: +770 ml   NUTRITION - FOCUSED PHYSICAL EXAM:  Flowsheet Row Most Recent Value  Orbital Region Moderate depletion  Upper Arm Region  No depletion  Thoracic and Lumbar Region No depletion  Buccal Region No depletion  Temple Region Mild depletion  Clavicle Bone Region No depletion  Clavicle and Acromion Bone Region No depletion  Scapular Bone Region Unable to assess  Dorsal Hand No depletion  Patellar Region Moderate depletion  Anterior Thigh Region No depletion  Posterior Calf Region Moderate depletion  Edema (RD Assessment) None  Hair Reviewed  Eyes Reviewed  Mouth Reviewed  Skin Reviewed  Nails Reviewed       Diet Order:   Diet Order             DIET DYS 3 Room service appropriate? Yes with Assist; Fluid consistency: Thin  Diet effective now                   EDUCATION NEEDS:   Not appropriate for education at this time  Skin:  Skin Assessment: Skin Integrity Issues: Skin Integrity Issues:: Stage II Stage II: sacrum  Last BM:  8/8 type 7  Height:   Ht Readings from Last 1 Encounters:  03/25/23 4\' 11"  (1.499 m)    Weight:   Wt Readings from Last 1 Encounters:  04/01/23 55.4 kg    Ideal Body Weight:     BMI:  Body mass index is 24.67 kg/m.  Estimated Nutritional Needs:   Kcal:  1375-1650  Protein:  65-80 g  Fluid:  > 1.6 L  Samantha [name], RDN, LDN  Clinical Nutrition

## 2023-04-01 NOTE — Progress Notes (Signed)
Pt receives out-pt HD at [facility] MWF. Will assist as needed.   Olivia Canter Renal Navigator [phone]

## 2023-04-01 NOTE — Progress Notes (Signed)
TR[facility] night cross cover note:   I was notified by RN that this patient who has end-stage renal disease on hemodialysis, has had hypoglycemia since returning from hemodialysis this evening, with CBG results in the 60s.  She subsequently received half an amp of D50, with ensuing improvement in CBG to 128.  She is reported to have poor oral intake, and arrival of tube feeding from pharmacy is currently pending.   Tonight's dose of Semglee 5 units subcu nightly was subsequently held.    Newton Pigg, DO Hospitalist

## 2023-04-01 NOTE — Telephone Encounter (Signed)
Patient is currently admitted into hospital

## 2023-04-01 NOTE — TOC CM/SW Note (Signed)
Transition of Care [facility]) - Inpatient Brief Assessment   Patient Details  Name: Samantha [name] MRN: [id number] Date of Birth: 1945-11-10  Transition of Care [facility]) CM/SW Contact:    Tom-Johnson, Hershal Coria, RN Phone Number: 04/01/2023, 3:51 PM   Clinical Narrative:  Patient presented to the ED with Abdominal pain, Vomiting and Fever. 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 Nephrology following.   From home with daughter, [name]. Has four daughters. Recently relocated from [place] to [place]. Patient with limited mobility and Lt sided Hemiparesis from previous CVA requires assistance at home. Was recently at a SNF and discharged home with Home Health PT/OT/RN disciplines from [facility] which will be resumed at discharge.  Has a w/c, Hoyer lift, shower seat at home.  [name] states patient will be getting an Aide from [place] in September but family is paying out of pocket for Aide services at this time.    [name] requesting for a GI consult for patient's Peg tube and Outpatient f/u at discharge. MD notified.   PCP is Salvatore Decent, FNP and uses [facility] on [name] and [facility] on [address] in [place].   Patient is currently active with [facility] for Home Health disciplines and info on AVS.   CM will continue to follow as patient progresses with care towards discharge.    Transition of Care Asessment: Insurance and Status: Insurance coverage has been reviewed Patient has primary care physician: Yes Home environment has been reviewed: Yes Prior level of function:: Limited Mobility with w/c and hoyer lift Prior/Current Home Services: Current home services (Home Health PT/OT/RN disciplines) Social Determinants of Health Reivew: SDOH reviewed  no interventions necessary Readmission risk has been reviewed: Yes Transition of care needs: transition of care needs identified, TOC will continue to follow

## 2023-04-01 NOTE — Plan of Care (Signed)
  Problem: Nutritional: Goal: Maintenance of adequate nutrition will improve Outcome: Progressing   Problem: Skin Integrity: Goal: Risk for impaired skin integrity will decrease Outcome: Progressing

## 2023-04-01 NOTE — Progress Notes (Addendum)
**Note De-Identified vi Obfusction** PROGRESS NOTE    Frtun Pol  UJW:[id number] DOB: [birth year] DOA: 03/30/2023 PCP: Slvtore Decent, FNP  Chief Complint  [name] presents with   Abdominl Pin   Vomiting    Brief Nrrtive:   Samantha [name] is  77 y.o. femle with medicl history significnt of cute cholecystitis with cholelithisis sttus post lproscopic cholecystostomy plcement in April 2024, systolic CHF EF 40%, ESRD on HD, CVA with left-sided hemipresis, dysphgi with PEG tube feeding, insulin-dependent DM2 comes to the hospitl with fever.   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  This hs been dislodged in between nd exchnged.  Currently output looks oky, we will reconsult IR to ensure proper functioning of this.  Dringe t this time does not pper to be infected. 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 but tube ppers to be stble with decompressed gllbldder --pprecite IR, drin pproprite   History of CVA with left-sided hemipresis Dysphgi getting PEG tube - [name] ws lredy on  Eliquis.  Getting nocturnl peg tube feeding t home.  Will consult dietitin here   Insulin-dependent dibetes mellitus type 2 - Sliding scle, Accu-Chek.  Will reduce home long-cting from 16 units to 5 units nd SSI.  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  Continue Coreg.  IV s needed   History of DVT - IVC filter in plce.  On Eliquis which we will continue    DVT prophylxis: eliquis Code Sttus: full Fmily Communiction: none - clled dughter jocelyn, no nswer Disposition:   Sttus is: Inptient Remins inptient pproprite becuse: need for continued inptient cre   Consultnts:  renl  Procedures:  none  Antimicrobils:  Anti-infectives (From dmission, onwrd)    Strt     Dose/Rte Route Frequency Ordered Stop   04/08/23 1200  vncomycin (VANCOREADY) IVPB 500 mg/100 mL  Sttus:  Discontinued        500 mg 100 mL/hr over 60 Minutes Intrvenous Every Thu (Hemodilysis) 04/01/23 1336 04/01/23 1416   04/02/23 1200  vncomycin (VANCOREADY) IVPB 500 mg/100 mL        500 mg 100 mL/hr over 60 Minutes Intrvenous Every M-W-F (Hemodilysis) 03/31/23 0929     04/01/23 1800  vncomycin (VANCOREADY) IVPB 500 mg/100 mL        500 mg 100 mL/hr over 60 Minutes Intrvenous Every Thu (Hemodilysis) 04/01/23 1416 04/08/23 1159   04/01/23 0200  cefTRIAXone (ROCEPHIN) 1 g in sodium chloride 0.9 % 100 mL  IVPB        1 g 200 mL/hr over 30 Minutes Intravenous Every 24 hours 03/31/23 0919     03/31/23 1400  metroNIDZOLE (FLGYL) IVPB 500 mg        500 mg 100 mL/hr over 60 Minutes Intravenous Every 12 hours 03/31/23 0919     03/31/23 0930  vancomycin (VNCOREDY) IVPB 1250 mg/250 mL        1,250 mg 166.7 mL/hr over 90 Minutes Intravenous  Once 03/31/23 0929 03/31/23 1134   03/31/23 0030  cefTRIXone (ROCEPHIN) 2 g  in sodium chloride 0.9 % 100 mL IVPB        2 g 200 mL/hr over 30 Minutes Intravenous  Once 03/31/23 0016 03/31/23 0221   03/31/23 0030  metroNIDZOLE (FLGYL) IVPB 500 mg        500 mg 100 mL/hr over 60 Minutes Intravenous  Once 03/31/23 0016 03/31/23 0334       Subjective: No complaints  Objective: Vitals:   03/31/23 2101 04/01/23 0508 04/01/23 0839 04/01/23 1414  BP: (!) 114/58 (!) 142/68 129/61 139/83  Pulse: 89 85 81 80  Resp:  17 16 (!) 21  Temp: 97.8 F (36.6 C) 98.4 F (36.9 C) 98.8 F (37.1 C) 98.4 F (36.9 C)  TempSrc:  Oral Oral Oral  SpO2: 97% 98% 97% 99%  Weight:    55.4 kg    Intake/Output Summary (Last 24 hours) at 04/01/2023 1429 Last data filed at 04/01/2023 0900 Gross per 24 hour  Intake 620 ml  Output 100 ml  Net 520 ml   Filed Weights   03/30/23 2348 04/01/23 1414  Weight: 56.2 kg 55.4 kg    Examination:  General exam: ppears calm and comfortable  Respiratory system: unlabored Cardiovascular system: RRR Gastrointestinal system: bdomen is nondistended, soft and nontender. Central nervous system: lert  Extremities: no LEE   Data Reviewed: I have personally reviewed following labs and imaging studies  CBC: Recent Labs  Lab 03/25/23 1604 03/31/23 0050 03/31/23 0915 04/01/23 0354  WBC 7.2 14.7* 12.0* 10.2  NEUTROBS 3.5 10.3*  --   --   HGB 11.9* 11.5* 10.4* 9.8*  HCT 37.1 36.5 32.4* 30.6*  MCV 99.2 99.7 101.3* 97.8  PLT 161.0 159 138* 131*    Basic Metabolic Panel: Recent Labs  Lab 03/25/23 1604 03/31/23 0050 03/31/23 0915 04/01/23 0354  N 138 133*  --  138  K 4.0 3.5  --  3.1*  CL 95* 92*  --  98  CO2 35* 29  --  25  GLUCOSE 97 129*  --  88  BUN 14 24*  --  37*  CRETININE 2.78* 3.19* 3.39* 3.76*  CLCIUM 9.5 9.1  --  8.3*  MG  --   --   --  2.0  PHOS  --   --   --  1.8*    GFR: Estimated Creatinine Clearance: 9.7 mL/min () (by C-G formula based on SCr of 3.76 mg/dL (H)).  Liver Function Tests: Recent Labs   Lab 03/25/23 1604 03/31/23 0050 04/01/23 0354  ST 24 22 25   LT 19 18 19   LKPHOS 90 91 81  BILITOT 0.3 0.6 0.4  PROT 6.7 6.9 5.7*  LBUMIN 3.9 3.2* 2.5*    CBG: Recent Labs  Lab 03/31/23 1148 03/31/23 1729 04/01/23 0723 04/01/23 1107  GLUCP 115* 105* 89 98     Recent Results (from the past 240 hour(s))  Resp panel by RT-PCR (RSV, Flu &B, Covid) nterior Nasal **Note De-Identified vi Obfusction** Swb     Sttus: None   Collection Time: 03/31/23 12:50 M   Specimen: nterior Nsl Swb  Result Vlue Ref Rnge Sttus   SRS Coronvirus 2 by RT PCR NEGTIVE NEGTIVE Finl   Influenz  by PCR NEGTIVE NEGTIVE Finl   Influenz B by PCR NEGTIVE NEGTIVE Finl    Comment: (NOTE) The Xpert Xpress SRS-CoV-2/FLU/RSV plus ssy is intended s n id in the dignosis of influenz from Nsophryngel swb specimens nd should not be used s  sole bsis for tretment. Nsl wshings nd spirtes re uncceptble for Xpert Xpress SRS-CoV-2/FLU/RSV testing.  Fct Sheet for Ptients: BloggerCourse.com  Fct Sheet for [facility] Providers: SeriousBroker.it  This test is not yet pproved or clered by the [facility] nd hs been uthorized for detection nd/or dignosis of SRS-CoV-2 by FD under n Emergency Use uthoriztion (EU). This EU will remin in effect (mening this test cn be used) for the durtion of the COVID-19 declrtion under Section 564(b)(1) of the ct, 21 U.S.C. section 360bbb-3(b)(1), unless the uthoriztion is terminted or revoked.     Resp Syncytil Virus by PCR NEGTIVE NEGTIVE Finl    Comment: (NOTE) Fct Sheet for Ptients: BloggerCourse.com  Fct Sheet for [facility] Providers: SeriousBroker.it  This test is not yet pproved or clered by the [facility] nd hs been uthorized for detection nd/or dignosis of SRS-CoV-2 by FD under n Emergency Use  uthoriztion (EU). This EU will remin in effect (mening this test cn be used) for the durtion of the COVID-19 declrtion under Section 564(b)(1) of the ct, 21 U.S.C. section 360bbb-3(b)(1), unless the uthoriztion is terminted or revoked.  Performed t [facility], [address], [place], [place] [address]   Blood Culture (routine x 2)     Sttus: None (Preliminry result)   Collection Time: 03/31/23 12:50 M   Specimen: BLOOD RIGHT RM  Result Vlue Ref Rnge Sttus   Specimen Description BLOOD RIGHT RM  Finl   Specil Requests   Finl    BOTTLES DRWN EROBIC ND NEROBIC Blood Culture dequte volume   Culture   Finl    NO GROWTH 1 DY Performed t [facility], [address], [place], [place] [address]    Report Sttus PENDING  Incomplete  Blood Culture (routine x 2)     Sttus: None (Preliminry result)   Collection Time: 03/31/23  1:19 M   Specimen: BLOOD RIGHT HND  Result Vlue Ref Rnge Sttus   Specimen Description BLOOD RIGHT HND  Finl   Specil Requests   Finl    BOTTLES DRWN EROBIC ONLY Blood Culture results my not be optiml due to n indequte volume of blood received in culture bottles   Culture   Finl    NO GROWTH 1 DY Performed t [facility], [address] [place], [place] [address]    Report Sttus PENDING  Incomplete         Rdiology Studies: CT BDOMEN PELVIS WO CONTRST  Result Dte: 03/31/2023 CLINICL DT:  Bowel obstruction suspected Sepsis bdominl pin, cute, nonloclized. Vomiting. EXM: CT BDOMEN ND PELVIS WITHOUT CONTRST TECHNIQUE: Multidetector CT imging of the bdomen nd pelvis ws performed following the stndrd protocol without IV contrst. RDITION DOSE REDUCTION: This exm ws performed ccording to the deprtmentl dose-optimiztion progrm which includes utomted exposure control, djustment of the m nd/or kV ccording to ptient size nd/or use of itertive reconstruction  technique. COMPRISON:  02/20/2023 FINDINGS: Lower chest: Smll right pleurl  effusion. Compressive atelectasis in the right lower lobe. Dialysis catheter in the right heart. Scattered coronary artery calcifications. Hepatobiliary: Cholecystostomy tube remains in the gallbladder which is decompressed. Partially calcified 9 mm gallstone again noted. No biliary ductal dilatation. Pancreas: No focal abnormality or ductal dilatation. Spleen: No focal abnormality.  Normal size. Adrenals/Urinary Tract: Adrenal glands normal. Stable cyst in the upper pole of the right kidney for which no additional imaging is recommended. No stones or hydronephrosis. Urinary bladder unremarkable. Stomach/Bowel: Large stool burden in the rectum concerning for fecal impaction. This is similar to prior study. Appendix is normal. Gastrostomy tube is within the stomach. No evidence of bowel obstruction. Vascular/Lymphatic: No evidence of aneurysm or adenopathy. IVC filter remains in stable position in the infrarenal IVC. Reproductive: Prior hysterectomy.  No adnexal masses. Other: No free fluid or free air. Musculoskeletal: No acute bony abnormality. IMPRESSION: No acute findings in the abdomen or pelvis. Stable large stool burden in the rectum concerning for fecal impaction. Cholelithiasis. Stable cholecystostomy tube position within the decompressed gallbladder. Small right pleural effusion with compressive atelectasis in the right lower lobe. Coronary artery disease. Electronically Signed   By: Charlett Nose M.D.   On: 03/31/2023 01:43   DG Chest Port 1 View  Result Date: 03/31/2023 CLINICAL DATA:  Sepsis EXAM: PORTABLE CHEST 1 VIEW COMPARISON:  12/12/2022 FINDINGS: Stable elevation of the right hemidiaphragm. Minimal bibasilar atelectasis. Lungs are otherwise clear. No pneumothorax or pleural effusion. Right upper extremity HERO graft tip is again seen within the deep right atrium in the expected location of the tricuspid valve plane.  Cardiac size is at the upper limits of normal. Pulmonary vascularity is normal. No acute bone abnormality. IMPRESSION: 1. Minimal bibasilar atelectasis. Electronically Signed   By: Helyn Numbers M.D.   On: 03/31/2023 00:29        Scheduled Meds:  apixaban  5 mg Per Tube BID   bisacodyl  5 mg Oral BID   carvedilol  3.125 mg Oral BID WC   Chlorhexidine Gluconate Cloth  6 each Topical Q0600   insulin aspart  0-6 Units Subcutaneous TID WC   insulin glargine-yfgn  10 Units Subcutaneous QHS   polyethylene glycol  17 g Per Tube BID   sodium chloride flush  10-40 mL Intracatheter Q12H   Continuous Infusions:  cefTRIAXone (ROCEPHIN)  IV 1 g (04/01/23 0152)   metronidazole 500 mg (04/01/23 0238)   [START ON 04/02/2023] vancomycin     vancomycin       LOS: 1 day    Time spent: oer 30 min    Lacretia Nicks, MD [facility]   To contact the attending provider between 7A-7P or the covering provider during after hours 7P-7A, please log into the web site [email or web address] and access using universal [place] password for that web site. If you do not have the password, please call the hospital operator.  04/01/2023, 2:29 PM

## 2023-04-01 NOTE — Progress Notes (Signed)
[name] Progress Note   Subjective:    Seen and examined on HD. No UF with treatment today. Euvolemic on exam. Noted K+ of 3.1 and on 4K bath with today's treatment. She denies SOB, CP, and N/V.   Objective Vitals:   04/01/23 1414 04/01/23 1427 04/01/23 1430 04/01/23 1500  BP: 139/83 135/64 134/72 131/64  Pulse: 80 79 80 82  Resp: (!) 21 (!) 23 (!) 27 (!) 21  Temp: 98.4 F (36.9 C)     TempSrc: Oral     SpO2: 99% 100% 99% 100%  Weight: 55.4 kg      Physical Exam General: Awake, on RA, NAD Heart: S1 and S2; No MRGs Lungs: Clear anterior Abdomen:Soft and non-tender Extremities:No LE edema Dialysis Access: [facility]   Filed Weights   03/30/23 2348 04/01/23 1414  Weight: 56.2 kg 55.4 kg    Intake/Output Summary (Last 24 hours) at 04/01/2023 1518 Last data filed at 04/01/2023 0900 Gross per 24 hour  Intake 620 ml  Output 100 ml  Net 520 ml    Additional Objective Labs: Basic Metabolic Panel: Recent Labs  Lab 03/25/23 1604 03/31/23 0050 03/31/23 0915 04/01/23 0354  NA 138 133*  --  138  K 4.0 3.5  --  3.1*  CL 95* 92*  --  98  CO2 35* 29  --  25  GLUCOSE 97 129*  --  88  BUN 14 24*  --  37*  CREATININE 2.78* 3.19* 3.39* 3.76*  CALCIUM 9.5 9.1  --  8.3*  PHOS  --   --   --  1.8*   Liver Function Tests: Recent Labs  Lab 03/25/23 1604 03/31/23 0050 04/01/23 0354  AST 24 22 25   ALT 19 18 19   ALKPHOS 90 91 81  BILITOT 0.3 0.6 0.4  PROT 6.7 6.9 5.7*  ALBUMIN 3.9 3.2* 2.5*   No results for input(s): "LIPASE", "AMYLASE" in the last 168 hours. CBC: Recent Labs  Lab 03/25/23 1604 03/31/23 0050 03/31/23 0915 04/01/23 0354  WBC 7.2 14.7* 12.0* 10.2  NEUTROABS 3.5 10.3*  --   --   HGB 11.9* 11.5* 10.4* 9.8*  HCT 37.1 36.5 32.4* 30.6*  MCV 99.2 99.7 101.3* 97.8  PLT 161.0 159 138* 131*   Blood Culture    Component Value Date/Time   SDES BLOOD RIGHT HAND 03/31/2023 0119   SPECREQUEST  03/31/2023 0119    BOTTLES DRAWN AEROBIC ONLY  Blood Culture results may not be optimal due to an inadequate volume of blood received in culture bottles   CULT  03/31/2023 0119    NO GROWTH 1 DAY Performed at [facility], [address] [place], [place] [address]    REPTSTATUS PENDING 03/31/2023 0119    Cardiac Enzymes: No results for input(s): "CKTOTAL", "CKMB", "CKMBINDEX", "TROPONINI" in the last 168 hours. CBG: Recent Labs  Lab 03/31/23 1148 03/31/23 1729 04/01/23 0723 04/01/23 1107  GLUCAP 115* 105* 89 98   Iron Studies: No results for input(s): "IRON", "TIBC", "TRANSFERRIN", "FERRITIN" in the last 72 hours. Lab Results  Component Value Date   INR 2.1 (H) 03/31/2023   INR 1.5 (H) 12/13/2022   Studies/Results: CT ABDOMEN PELVIS WO CONTRAST  Result Date: 03/31/2023 CLINICAL DATA:  Bowel obstruction suspected Sepsis Abdominal pain, acute, nonlocalized. Vomiting. EXAM: CT ABDOMEN AND PELVIS WITHOUT CONTRAST TECHNIQUE: Multidetector CT imaging of the abdomen and pelvis was performed following the standard protocol without IV contrast. RADIATION DOSE REDUCTION: This exam was performed according  to the departmental dose-optimization program which includes automated exposure control, adjustment of the mA and/or kV according to patient size and/or use of iterative reconstruction technique. COMPARISON:  02/20/2023 FINDINGS: Lower chest: Small right pleural effusion. Compressive atelectasis in the right lower lobe. Dialysis catheter in the right heart. Scattered coronary artery calcifications. Hepatobiliary: Cholecystostomy tube remains in the gallbladder which is decompressed. Partially calcified 9 mm gallstone again noted. No biliary ductal dilatation. Pancreas: No focal abnormality or ductal dilatation. Spleen: No focal abnormality.  Normal size. Adrenals/Urinary Tract: Adrenal glands normal. Stable cyst in the upper pole of the right kidney for which no additional imaging is recommended. No stones or hydronephrosis. Urinary  bladder unremarkable. Stomach/Bowel: Large stool burden in the rectum concerning for fecal impaction. This is similar to prior study. Appendix is normal. Gastrostomy tube is within the stomach. No evidence of bowel obstruction. Vascular/Lymphatic: No evidence of aneurysm or adenopathy. IVC filter remains in stable position in the infrarenal IVC. Reproductive: Prior hysterectomy.  No adnexal masses. Other: No free fluid or free air. Musculoskeletal: No acute bony abnormality. IMPRESSION: No acute findings in the abdomen or pelvis. Stable large stool burden in the rectum concerning for fecal impaction. Cholelithiasis. Stable cholecystostomy tube position within the decompressed gallbladder. Small right pleural effusion with compressive atelectasis in the right lower lobe. Coronary artery disease. Electronically Signed   By: Charlett Nose M.D.   On: 03/31/2023 01:43   DG Chest Port 1 View  Result Date: 03/31/2023 CLINICAL DATA:  Sepsis EXAM: PORTABLE CHEST 1 VIEW COMPARISON:  12/12/2022 FINDINGS: Stable elevation of the right hemidiaphragm. Minimal bibasilar atelectasis. Lungs are otherwise clear. No pneumothorax or pleural effusion. Right upper extremity HERO graft tip is again seen within the deep right atrium in the expected location of the tricuspid valve plane. Cardiac size is at the upper limits of normal. Pulmonary vascularity is normal. No acute bone abnormality. IMPRESSION: 1. Minimal bibasilar atelectasis. Electronically Signed   By: Helyn Numbers M.D.   On: 03/31/2023 00:29    Medications:  cefTRIAXone (ROCEPHIN)  IV 1 g (04/01/23 0152)   metronidazole 500 mg (04/01/23 0238)   [START ON 04/02/2023] vancomycin     vancomycin      apixaban  5 mg Per Tube BID   bisacodyl  5 mg Oral BID   carvedilol  3.125 mg Oral BID WC   Chlorhexidine Gluconate Cloth  6 each Topical Q0600   insulin aspart  0-6 Units Subcutaneous Q4H   insulin glargine-yfgn  5 Units Subcutaneous QHS   polyethylene glycol  17 g  Per Tube BID   sodium chloride flush  10-40 mL Intracatheter Q12H    Dialysis Orders: MWF SW  3h   350/600   59kg   2/2 bath  TDC   Heparin 2500+ 1500 midrun - last OP HD 8/05, post wt 56.2kg   - no esa, no vdra - last Hb 12.2, pth 179  Home meds include - tylenol, eliquis, bisacodyl, carvedilol 3.125 bid, insulin aspart/ glargine, renavite, osmolite tube feeds, escitalopram    Assessment/Plan: SIRS/ fever/ Samantha [name] - unclear source, sig constipation per abd CT, but chole tube appeared to be in proper position. Blood cx's pending on IV abx. Per pmd.  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 Using [facility] Off schedule, on HD, can resume routine schedule next week.  HTN/ volume - euvolemic on exam, CXR w/o edema, on RA. BP's wnl.  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 Follow. Will start ESA if needed MBD ckd - CCa in range, phos is low, phos supplementation given today, will re-check in the AM. No vdra at op unit.  H/o CVA - w/ subsequent PEG tube H/o dementia  Samantha Holmes, NP [facility] 04/01/2023,3:18 PM  LOS: 1 day

## 2023-04-01 NOTE — Progress Notes (Signed)
Received patient in bed to unit.  Alert x 3 Informed consent signed and in chart.   TX duration:3  Patient tolerated well.  Transported back to the room  Alert, without acute distress.  Hand-off given to patient's nurse.   Access used: catheter Access issues: n/a  Total UF removed: 0 Medication(s) given: VAnc    04/01/23 1736  Vitals  Temp 97.6 F (36.4 C)  Temp Source Oral  BP 126/67  BP Location Right Arm  BP Method Automatic  Patient Position (if appropriate) Lying  Pulse Rate 88  Pulse Rate Source Monitor  Resp (!) 27  Oxygen Therapy  SpO2 100 %  O2 Device Room Air  During Treatment Monitoring  HD Safety Checks Performed Yes  Intra-Hemodialysis Comments Tx completed;Tolerated well  Post Treatment  Dialyzer Clearance Lightly streaked  Duration of HD Treatment -hour(s) 3 hour(s)  Hemodialysis Intake (mL) 0 mL  Liters Processed 72  Fluid Removed (mL) 0 mL  Tolerated HD Treatment Yes  Hemodialysis Catheter Right Internal jugular  No placement date or time found.   Placed prior to admission: Yes  Orientation: Right  Access Location: Internal jugular  Site Condition No complications  Blue Lumen Status Heparin locked  Red Lumen Status Heparin locked  Catheter fill solution Heparin 1000 units/ml  Catheter fill volume (Arterial) 2.2 cc  Catheter fill volume (Venous) 2.3  Dressing Type Transparent  Dressing Status Antimicrobial disc in place  Drainage Description None  Dressing Change Due 04/08/23  Post treatment catheter status Capped and Clamped        [facility]

## 2023-04-01 NOTE — Telephone Encounter (Signed)
Returned call to Samantha [name] to give verbal orders and was informed that patient was taken to ED for evaluation after temp increased

## 2023-04-02 DIAGNOSIS — J90 Pleural effusion, not elsewhere classified: Secondary | ICD-10-CM

## 2023-04-02 DIAGNOSIS — A419 Sepsis, unspecified organism: Secondary | ICD-10-CM | POA: Diagnosis not present

## 2023-04-02 LAB — GLUCOSE, CAPILLARY
Glucose-Capillary: 149 mg/dL — ABNORMAL HIGH (ref 70–99)
Glucose-Capillary: 180 mg/dL — ABNORMAL HIGH (ref 70–99)
Glucose-Capillary: 193 mg/dL — ABNORMAL HIGH (ref 70–99)
Glucose-Capillary: 198 mg/dL — ABNORMAL HIGH (ref 70–99)
Glucose-Capillary: 219 mg/dL — ABNORMAL HIGH (ref 70–99)
Glucose-Capillary: 226 mg/dL — ABNORMAL HIGH (ref 70–99)

## 2023-04-02 MED ORDER — VANCOMYCIN VARIABLE DOSE PER UNSTABLE RENAL FUNCTION (PHARMACIST DOSING): Status: DC

## 2023-04-02 MED ORDER — NEPRO/CARBSTEADY PO LIQD
1000.0000 mL | ORAL | Status: DC
  Administered 2023-04-02 – 2023-04-04 (×3): 1000 mL
  Filled 2023-04-02 (×2): qty 1000

## 2023-04-02 MED ORDER — VANCOMYCIN HCL 500 MG/100ML IV SOLN: 500.0000 mg | INTRAVENOUS | Status: DC

## 2023-04-02 MED ORDER — SODIUM PHOSPHATES 45 MMOLE/15ML IV SOLN
15.0000 mmol | Freq: Once | INTRAVENOUS | Status: AC
  Administered 2023-04-02: 15 mmol via INTRAVENOUS
  Filled 2023-04-02: qty 5

## 2023-04-02 MED ORDER — FREE WATER
40.0000 mL | Status: DC
  Administered 2023-04-02 – 2023-04-06 (×43): 40 mL

## 2023-04-02 NOTE — Progress Notes (Signed)
Nutrition Brief Note  RD spoke with patient's daughter on the phone who reports patient gets Nepro @40  ml/hr at night from 9pm-7am. Patient was eating more at home and did not require all nutrition needs to be met with TF alone. Patient has been noted to have poor po intake in hospital and patient's daughter wishes for her mom's nutrition needs be met with solely TF.   Change TF Nepro @40  ml/hr (960 ml/day) -TF at goal rate provides 1728 kcal, 78 g protein, 698 ml fluid  MD to add water flush. Patient normally gets 20 ml/hr FWF at home.   Labs reviewed Meds reviewed  Leodis Rains, RDN, LDN  Clinical Nutrition

## 2023-04-02 NOTE — Consult Note (Addendum)
NAME:  Samantha [name], MRN:  [id number], DOB:  June 12, 1946, LOS: 2 ADMISSION DATE:  03/30/2023, CONSULTATION DATE:  8/9 REFERRING MD:  Lowell Guitar, CHIEF COMPLAINT:  fever and pleural effusion    History of Present Illness:  77 year female w/ PMH as per below. Underwent recent cholecystotomy tube placement for acute cholecystitis back  in April 2024. Was admitted from home on 8/7 w/ cc: fever and abd discomfort. Initially met SIRS criteria and was admitted w/ working dx of sepsis of unclear etiology and placed on empiric abx. CT scan obtained showed constipation, stool in rectal vault and persistent cholelithiasis but GB was decompressed, there was a small right pleural effusion w/ associated atx. She was seen in consult by IR who evaluated the GB tube, and nephro to assist w/ dialysis while in-patient. CT chest was obtained given on-going fever. This showed: moderate sized right effusion w/ associated atelectasis, also noted slight increased strandy density near the cholecystectomy tube c/w imaging 24 hrs earlier.  Cultures to date have been negative.  PCCM called to evaluate the right pleural effusion in the setting of fever.  Pertinent  Medical History  Acute cholecystitis w/ cholelithiasis s/p lap chole 2024, HFrEF (40%), ESRD on iHD, CVA w/ left sided hemiparesis, dysphagia w/ PEG, DM (insulin dep), CAD, HTN, h/o DVT has IVC in place also on Eliquis,   Significant Hospital Events: Including procedures, antibiotic start and stop dates in addition to other pertinent events   8/7 admitted w/ fever and abd pain. CT w/ stool in rectal vault. Small right effusion. Depressed GB w/ what appeared to be well positioned GB tube. Abx started  8/8 still having fever. Wbc ct better. CT chest. Obtained. Pulm asked to see d/t slightly larger pleural effusion on 8/9  Interim History / Subjective:  Stil c/o abd pain    Objective   Blood pressure 139/78, pulse 92, temperature 98.7 F (37.1 C), temperature source  Oral, resp. rate 16, weight 61.2 kg, SpO2 99%.        Intake/Output Summary (Last 24 hours) at 04/02/2023 1610 Last data filed at 04/02/2023 1300 Gross per 24 hour  Intake 640.33 ml  Output 40 ml  Net 600.33 ml   Filed Weights   04/01/23 1414 04/01/23 1747 04/01/23 1856  Weight: 55.4 kg 58.2 kg 61.2 kg    Examination: General: chronically ill appearing 77 year old female resting in bed no distress HENT: NCAT no JVD  Lungs: clear dec bases  Cardiovascular: RRR Abdomen: soft PEG unremarkable chole tube noted Extremities: warm and dry. Legs contracted w/ bilateral foot drop Neuro: awake, oriented x 2 speech is slow. Left sided hemiparesis.  GU: anuric   Resolved Hospital Problem list     Assessment & Plan:  Fevers  SIRS vs sepsis (source not clear)  Cholelithiasis w/ cholecystectomy tube   Right pleural effusion  CVA w/ left sided hemiparesis  Dysphagia PEG dependant  ESRD (M/W/F iHD) HFrEF (EF 45%) H/o DVT  CAD H/o htn  DM type II now Dependent  Mild thrombocytopenia   Pulm prob list  Right pleural effusion in context of fever  Eitiology not clear. Could be sympathetic from recent GB drain, also not uncommon to see effusion in ESRD and HFrEF. Finally consider recent other infectious process such as recent PNA?   Plan Hold DOAC Will plan on bedside POCUS and prob diagnostic bedside thora on 8/10 Cont abx   Best Practice (right click and "Reselect all SmartList Selections" daily)   Per  primary   Labs   CBC: Recent Labs  Lab 03/31/23 0050 03/31/23 0915 04/01/23 0354 04/02/23 0605  WBC 14.7* 12.0* 10.2 6.0  NEUTROABS 10.3*  --   --   --   HGB 11.5* 10.4* 9.8* 10.5*  HCT 36.5 32.4* 30.6* 32.8*  MCV 99.7 101.3* 97.8 100.0  PLT 159 138* 131* 138*    Basic Metabolic Panel: Recent Labs  Lab 03/31/23 0050 03/31/23 0915 04/01/23 0354 04/02/23 0605  NA 133*  --  138 136  K 3.5  --  3.1* 3.5  CL 92*  --  98 98  CO2 29  --  25 26  GLUCOSE 129*  --  88  194*  BUN 24*  --  37* 22  CREATININE 3.19* 3.39* 3.76* 2.25*  CALCIUM 9.1  --  8.3* 8.1*  MG  --   --  2.0 1.8  PHOS  --   --  1.8* 1.3*   GFR: Estimated Creatinine Clearance: 16.9 mL/min (A) (by C-G formula based on SCr of 2.25 mg/dL (H)). Recent Labs  Lab 03/31/23 0050 03/31/23 0103 03/31/23 0228 03/31/23 0915 04/01/23 0354 04/02/23 0605  WBC 14.7*  --   --  12.0* 10.2 6.0  LATICACIDVEN  --  1.5 1.3  --   --   --     Liver Function Tests: Recent Labs  Lab 03/31/23 0050 04/01/23 0354 04/02/23 0605  AST 22 25 44*  ALT 18 19 25   ALKPHOS 91 81 81  BILITOT 0.6 0.4 0.4  PROT 6.9 5.7* 6.0*  ALBUMIN 3.2* 2.5* 2.5*   No results for input(s): "LIPASE", "AMYLASE" in the last 168 hours. No results for input(s): "AMMONIA" in the last 168 hours.  ABG No results found for: "PHART", "PCO2ART", "PO2ART", "HCO3", "TCO2", "ACIDBASEDEF", "O2SAT"   Coagulation Profile: Recent Labs  Lab 03/31/23 0050  INR 2.1*    Cardiac Enzymes: No results for input(s): "CKTOTAL", "CKMB", "CKMBINDEX", "TROPONINI" in the last 168 hours.  HbA1C: Hgb A1c MFr Bld  Date/Time Value Ref Range Status  03/25/2023 04:04 PM 6.3 4.6 - 6.5 % Final    Comment:    Glycemic Control Guidelines for People with Diabetes:Non Diabetic:  <6%Goal of Therapy: <7%Additional Action Suggested:  >8%   12/13/2022 07:22 PM 7.3 (H) 4.8 - 5.6 % Final    Comment:    (NOTE) Pre diabetes:          5.7%-6.4%  Diabetes:              >6.4%  Glycemic control for   <7.0% adults with diabetes     CBG: Recent Labs  Lab 04/01/23 2350 04/02/23 0007 04/02/23 0423 04/02/23 0717 04/02/23 1115  GLUCAP 129* 149* 180* 198* 226*    Review of Systems:   C/o abd pain. Otherwise no sig complaint   Past Medical History:  She,  has a past medical history of CVA (cerebral vascular accident) (HCC), Dementia (HCC), Diabetes mellitus without complication (HCC), ESRD (end stage renal disease) on dialysis (HCC), Heart failure  (HCC), HTN (hypertension), PEG (percutaneous endoscopic gastrostomy) status (HCC), and Thyroid disease.   Surgical History:   Past Surgical History:  Procedure Laterality Date   ABDOMINAL HYSTERECTOMY     IR EXCHANGE BILIARY DRAIN  02/11/2023   IR PERC CHOLECYSTOSTOMY  12/16/2022     Social History:   reports that she has never smoked. She does not have any smokeless tobacco history on file. She reports that she does not currently use alcohol. She  reports that she does not use drugs.   Family History:  Her family history is not on file.   Allergies Allergies  Allergen Reactions   Aspirin Anaphylaxis   Penicillins Anaphylaxis    **Tolerates cephalosporins     Home Medications  Prior to Admission medications   Medication Sig Start Date End Date Taking? Authorizing Provider  acetaminophen (TYLENOL) 500 MG tablet Place 500 mg into feeding tube every 6 (six) hours as needed for mild pain.   Yes [provider]  apixaban (ELIQUIS) 5 MG TABS tablet Take 1 tablet (5 mg total) by mouth 2 (two) times daily. Patient taking differently: Place 5 mg into feeding tube 2 (two) times daily. 12/25/22  Yes Ghimire, Werner Lean, MD  bisacodyl (DULCOLAX) 5 MG EC tablet Take 5 mg by mouth daily as needed for moderate constipation.   Yes [provider]  carvedilol (COREG) 3.125 MG tablet Take 1 tablet (3.125 mg total) by mouth 2 (two) times daily with a meal. 12/25/22  Yes Ghimire, Werner Lean, MD  insulin aspart (NOVOLOG) 100 UNIT/ML injection 0-9 Units, Subcutaneous, 3 times daily with meals, CBG < 70: Implement Hypoglycemia measures CBG 70 - 120: 0 units CBG 121 - 150: 1 unit CBG 151 - 200: 2 units CBG 201 - 250: 3 units CBG 251 - 300: 5 units CBG 301 - 350: 7 units CBG 351 - 400: 9 units CBG > 400: call MD 12/25/22  Yes Ghimire, Werner Lean, MD  insulin glargine (LANTUS) 100 UNIT/ML injection Inject 0.16 mLs (16 Units total) into the skin at bedtime. 12/25/22  Yes Ghimire, Werner Lean, MD   multivitamin (RENA-VIT) TABS tablet Take 1 tablet by mouth at bedtime. 12/25/22  Yes Ghimire, Werner Lean, MD  Nutritional Supplements (FEEDING SUPPLEMENT, OSMOLITE 1.5 CAL,) LIQD Place 237 mLs into feeding tube 3 (three) times daily with meals. 12/25/22  Yes Ghimire, Werner Lean, MD  escitalopram (LEXAPRO) 5 MG tablet Take 5 mg by mouth. Patient not taking: Reported on 03/25/2023    [provider]     Critical care time: NA   Simonne Martinet ACNP-BC [facility] Pulmonary/Critical Care Pager # [phone] OR # [phone] if no answer

## 2023-04-02 NOTE — Progress Notes (Addendum)
[facility] Progress Note   Subjective:    Seen and examined patient at bedside. No UF with yesterday's HD. She denies any acute issues. Next HD 8/10.  Objective Vitals:   04/01/23 2153 04/02/23 0009 04/02/23 0422 04/02/23 0827  BP: (!) 131/50 (!) 107/54 (!) 122/57 139/78  Pulse: 97 92 79 92  Resp: 18  16 16   Temp: (!) 102.6 F (39.2 C) 99.2 F (37.3 C) 98.2 F (36.8 C) 98.7 F (37.1 C)  TempSrc: Oral Oral Oral Oral  SpO2: 97% 97% 100% 99%  Weight:       Physical Exam General: Awake, on RA, NAD Heart: S1 and S2; No MRGs Lungs: Clear anterior Abdomen:Soft and non-tender Extremities:No LE edema Dialysis Access: R [facility] [place]  Filed Weights   04/01/23 1414 04/01/23 1747 04/01/23 1856  Weight: 55.4 kg 58.2 kg 61.2 kg    Intake/Output Summary (Last 24 hours) at 04/02/2023 1631 Last data filed at 04/02/2023 1300 Gross per 24 hour  Intake 640.33 ml  Output 40 ml  Net 600.33 ml    Additional Objective Labs: Basic Metabolic Panel: Recent Labs  Lab 03/31/23 0050 03/31/23 0915 04/01/23 0354 04/02/23 0605  NA 133*  --  138 136  K 3.5  --  3.1* 3.5  CL 92*  --  98 98  CO2 29  --  25 26  GLUCOSE 129*  --  88 194*  BUN 24*  --  37* 22  CREATININE 3.19* 3.39* 3.76* 2.25*  CALCIUM 9.1  --  8.3* 8.1*  PHOS  --   --  1.8* 1.3*   Liver Function Tests: Recent Labs  Lab 03/31/23 0050 04/01/23 0354 04/02/23 0605  AST 22 25 44*  ALT 18 19 25   ALKPHOS 91 81 81  BILITOT 0.6 0.4 0.4  PROT 6.9 5.7* 6.0*  ALBUMIN 3.2* 2.5* 2.5*   No results for input(s): "LIPASE", "AMYLASE" in the last 168 hours. CBC: Recent Labs  Lab 03/31/23 0050 03/31/23 0915 04/01/23 0354 04/02/23 0605  WBC 14.7* 12.0* 10.2 6.0  NEUTROABS 10.3*  --   --   --   HGB 11.5* 10.4* 9.8* 10.5*  HCT 36.5 32.4* 30.6* 32.8*  MCV 99.7 101.3* 97.8 100.0  PLT 159 138* 131* 138*   Blood Culture    Component Value Date/Time   SDES BLOOD RIGHT HAND 03/31/2023 0119   SPECREQUEST  03/31/2023  0119    BOTTLES DRAWN AEROBIC ONLY Blood Culture results may not be optimal due to an inadequate volume of blood received in culture bottles   CULT  03/31/2023 0119    NO GROWTH 2 DAYS Performed at [facility], [address], [place], [place] [address]    REPTSTATUS PENDING 03/31/2023 0119    Cardiac Enzymes: No results for input(s): "CKTOTAL", "CKMB", "CKMBINDEX", "TROPONINI" in the last 168 hours. CBG: Recent Labs  Lab 04/01/23 2350 04/02/23 0007 04/02/23 0423 04/02/23 0717 04/02/23 1115  GLUCAP 129* 149* 180* 198* 226*   Iron Studies: No results for input(s): "IRON", "TIBC", "TRANSFERRIN", "FERRITIN" in the last 72 hours. Lab Results  Component Value Date   INR 2.1 (H) 03/31/2023   INR 1.5 (H) 12/13/2022   Studies/Results: CT CHEST WO CONTRAST  Result Date: 04/01/2023 CLINICAL DATA:  Respiratory illness, nondiagnostic xray EXAM: CT CHEST WITHOUT CONTRAST TECHNIQUE: Multidetector CT imaging of the chest was performed following the standard protocol without IV contrast. RADIATION DOSE REDUCTION: This exam was performed according to the departmental dose-optimization program which includes automated  exposure control, adjustment of the mA and/or kV according to patient size and/or use of iterative reconstruction technique. COMPARISON:  Chest radiograph yesterday. FINDINGS: Cardiovascular: Right-sided dialysis catheter tip in the right atrium. Heart is upper normal in size. There are coronary artery calcifications. Mild atherosclerosis of the thoracic aorta. No aortic aneurysm. Mediastinum/Nodes: Scattered mediastinal lymph nodes are not enlarged by size criteria. Limited hilar assessment in the absence of IV contrast. No esophageal wall thickening. Lungs/Pleura: Moderate-sized right pleural effusion measures simple fluid density. Associated compressive atelectasis. There is a trace left pleural effusion. Mild volume loss in the medial left lower lobe, no centrally obstructing  lesion. No features of pulmonary edema. No suspicious pulmonary nodule or pulmonary mass. Upper Abdomen: Assessed on yesterday's CT. Cholecystostomy tube in the gallbladder. There may be slight increased strandy density and fluid adjacent to the cholecystostomy tube, series 3, image 118. Musculoskeletal: Thoracic spondylosis with spurring. There are no acute or suspicious osseous abnormalities. IMPRESSION: 1. Moderate-sized right pleural effusion measures simple fluid density. Associated compressive atelectasis. Trace left pleural effusion. 2. Cholecystostomy tube in the gallbladder. There may be slight increased strandy density and fluid adjacent to the cholecystostomy tube compared yesterday's CT. Aortic Atherosclerosis (ICD10-I70.0). Electronically Signed   By: Narda Rutherford M.D.   On: 04/01/2023 21:42    Medications:  cefTRIAXone (ROCEPHIN)  IV 1 g (04/02/23 0207)   feeding supplement (NEPRO CARB STEADY) 1,000 mL (04/02/23 1334)   metronidazole 500 mg (04/02/23 0915)   [START ON 04/05/2023] vancomycin      bisacodyl  5 mg Oral BID   carvedilol  3.125 mg Oral BID WC   Chlorhexidine Gluconate Cloth  6 each Topical Q0600   feeding supplement  237 mL Oral Q24H   feeding supplement (PROSource TF20)  60 mL Per Tube Daily   free water  40 mL Per Tube Q2H   insulin aspart  0-6 Units Subcutaneous Q4H   insulin glargine-yfgn  5 Units Subcutaneous QHS   polyethylene glycol  17 g Per Tube BID   sodium chloride flush  10-40 mL Intracatheter Q12H   vancomycin variable dose per unstable renal function (pharmacist dosing)   Does not apply See admin instructions    Dialysis Orders: MWF SW  3h   350/600   59kg   2/2 bath  TDC   Heparin 2500+ 1500 midrun - last OP HD 8/05, post wt 56.2kg   - no esa, no vdra - last Hb 12.2, pth 179   Home meds include - tylenol, eliquis, bisacodyl, carvedilol 3.125 bid, insulin aspart/ glargine, renavite, osmolite tube feeds, escitalopram    Assessment/Plan: SIRS/ fever/ [name] - unclear source, sig constipation per abd CT, but chole tube appeared to be in proper position. Blood cx's from 8/7 NGTD, on IV abx. Per pmd.  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 Using [facility]. Off schedule, next HD 8/10, can resume routine schedule next week.  HTN/ volume - remains euvolemic on exam, CXR w/o edema, on RA. BP's wnl. Last weight up alittle and appears she's getting small amt free water flushes. Will run minimal UF tomorrow. Watch volume closely. 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 Follow. Will start ESA if needed MBD ckd - CCa in range, repeat phos still low despite getting PO phos supplementation yesterday, will give IV sodium phosphate X 1. No vdra at op unit.  H/o CVA - w/ subsequent PEG tube  H/o dementia  Salome Holmes, NP [facility] 04/02/2023,4:31 PM  LOS: 2 days

## 2023-04-02 NOTE — Care Management Important Message (Signed)
Important Message  Patient Details  Name: Samantha [name] MRN: [id number] Date of Birth: [birth year]   Medicare Important Message Given:  Yes       04/02/2023, 4:10 PM

## 2023-04-02 NOTE — Plan of Care (Signed)

## 2023-04-02 NOTE — Progress Notes (Signed)
**Note De-Identified vi Obfusction** PROGRESS NOTE    Samantha [name]  ZOX:[id number] DOB: [birth year] DOA: 03/30/2023 PCP: Slvtore Decent, FNP  Chief Complint  [name] presents with   Abdominl Pin   Vomiting    Brief Nrrtive:   Samantha [name] is  77 y.o. femle with medicl history significnt of cute cholecystitis with cholelithisis sttus post lproscopic cholecystostomy plcement in April 2024, systolic CHF EF 40%, ESRD on HD, CVA with left-sided hemipresis, dysphgi with PEG tube feeding, insulin-dependent DM2 comes to the hospitl with fever.   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 Slight incresed strndy density nd fluid djcent to cholecystostomy tube?) - will consult pulmonry regrding possible thorcentesis, hold eliquis for now - UA with negtive nitrite, + LE, RBC's ---> no urine culture ordered - blood cultures Ngx2 dys - negtive covid, influenz, RSV   Fecl Impction  Constiption - ggressive bowel regimen    History of clculous colitis - Underwent cholecystostomy tube plcement in April 2024, follows outptient IR.  This hs been dislodged in between nd exchnged.  Currently output looks oky, we will reconsult IR to ensure proper functioning of this.  Dringe t this time does not pper to be infected. 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 but tube ppers to be  stble with decompressed gllbldder --pprecite IR, drin pproprite   History of CVA with left-sided hemipresis Dysphgi getting PEG tube - [name] ws lredy on Eliquis (on hold for now) -- nepro t 40 ml/hr, 20 ml hr FWF    Insulin-dependent dibetes mellitus type 2 - Sliding scle, Accu-Chek.  Will reduce home long-cting from 16 units to 5 units nd SSI.  This cn slowly be djusted while ptient on tube feeds.    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  Continue Coreg.  IV s needed   History of DVT - IVC filter in plce.  On Eliquis which we will continue    DVT prophylxis: eliquis Code Sttus: full Fmily Communiction: none - clled dughter jocelyn, no nswer Disposition:   Sttus is: Inptient Remins inptient pproprite becuse: need for continued inptient cre   Consultnts:  renl  Procedures:  none  Antimicrobils:  Anti-infectives (From dmission, onwrd)    Strt     Dose/Rte Route Frequency Ordered Stop   04/08/23 1200  vncomycin (VANCOREADY) IVPB 500 mg/100 mL  Sttus:  Discontinued        500 mg 100 mL/hr over 60 Minutes Intrvenous Every Thu (Hemodilysis) 04/01/23 1336 04/01/23 1416   04/05/23 1200  vncomycin (VANCOREADY) IVPB 500 mg/100 mL        500 mg 100 mL/hr over 60 Minutes Intrvenous Every M-W-F (Hemodilysis) 04/02/23 1120     04/02/23 1200  vncomycin (VANCOREADY) IVPB 500 mg/100 mL  Sttus:  Discontinued 500 mg 100 mL/hr over 60 Minutes Intravenous Every M-W-F (Hemodialysis) 03/31/23 0929 04/02/23 1120   04/02/23 1122  vancomycin variable dose per unstable renal function (pharmacist dosing)         Does not apply See admin instructions 04/02/23 1123     04/01/23 1800  vancomycin (VANCOREADY) IVPB 500 mg/100 mL        500 mg 100 mL/hr over 60 Minutes Intravenous Every Thu  (Hemodialysis) 04/01/23 1416 04/01/23 1721   04/01/23 0200  cefTRIAXone (ROCEPHIN) 1 g in sodium chloride 0.9 % 100 mL IVPB        1 g 200 mL/hr over 30 Minutes Intravenous Every 24 hours 03/31/23 0919     03/31/23 1400  metroNIDAZOLE (FLAGYL) IVPB 500 mg        500 mg 100 mL/hr over 60 Minutes Intravenous Every 12 hours 03/31/23 0919     03/31/23 0930  vancomycin (VANCOREADY) IVPB 1250 mg/250 mL        1,250 mg 166.7 mL/hr over 90 Minutes Intravenous  Once 03/31/23 0929 03/31/23 1134   03/31/23 0030  cefTRIAXone (ROCEPHIN) 2 g in sodium chloride 0.9 % 100 mL IVPB        2 g 200 mL/hr over 30 Minutes Intravenous  Once 03/31/23 0016 03/31/23 0221   03/31/23 0030  metroNIDAZOLE (FLAGYL) IVPB 500 mg        500 mg 100 mL/hr over 60 Minutes Intravenous  Once 03/31/23 0016 03/31/23 0334       Subjective: No complaints  Objective: Vitals:   04/01/23 2153 04/02/23 0009 04/02/23 0422 04/02/23 0827  BP: (!) 131/50 (!) 107/54 (!) 122/57 139/78  Pulse: 97 92 79 92  Resp: 18  16 16   Temp: (!) 102.6 F (39.2 C) 99.2 F (37.3 C) 98.2 F (36.8 C) 98.7 F (37.1 C)  TempSrc: Oral Oral Oral Oral  SpO2: 97% 97% 100% 99%  Weight:        Intake/Output Summary (Last 24 hours) at 04/02/2023 1540 Last data filed at 04/02/2023 1300 Gross per 24 hour  Intake 640.33 ml  Output 40 ml  Net 600.33 ml   Filed Weights   04/01/23 1414 04/01/23 1747 04/01/23 1856  Weight: 55.4 kg 58.2 kg 61.2 kg    Examination:  General: No acute distress. Cardiovascular: RRR Lungs: unlabored Abdomen: Soft, nontender, nondistended  Neurological: Alert. Moves all extremities 4 with equal strength. Cranial nerves II through XII grossly intact. Extremities: No clubbing or cyanosis. No edema.    Data Reviewed: I have personally reviewed following labs and imaging studies  CBC: Recent Labs  Lab 03/31/23 0050 03/31/23 0915 04/01/23 0354 04/02/23 0605  WBC 14.7* 12.0* 10.2 6.0  NEUTROABS 10.3*  --   --   --    HGB 11.5* 10.4* 9.8* 10.5*  HCT 36.5 32.4* 30.6* 32.8*  MCV 99.7 101.3* 97.8 100.0  PLT 159 138* 131* 138*    Basic Metabolic Panel: Recent Labs  Lab 03/31/23 0050 03/31/23 0915 04/01/23 0354 04/02/23 0605  NA 133*  --  138 136  K 3.5  --  3.1* 3.5  CL 92*  --  98 98  CO2 29  --  25 26  GLUCOSE 129*  --  88 194*  BUN 24*  --  37* 22  CREATININE 3.19* 3.39* 3.76* 2.25*  CALCIUM 9.1  --  8.3* 8.1*  MG  --   --  2.0 1.8  PHOS  --   --  1.8* 1.3* **Note De-Identified vi Obfusction** GFR: Estimted Cretinine Clernce: 16.9 mL/min () (by C-G formul bsed on SCr of 2.25 mg/dL (H)).  Liver Function Tests: Recent Lbs  Lb 03/31/23 0050 04/01/23 0354 04/02/23 0605  ST 22 25 44*  LT 18 19 25   LKPHOS 91 81 81  BILITOT 0.6 0.4 0.4  PROT 6.9 5.7* 6.0*  LBUMIN 3.2* 2.5* 2.5*    CBG: Recent Lbs  Lb 04/01/23 2350 04/02/23 0007 04/02/23 0423 04/02/23 0717 04/02/23 1115  GLUCP 129* 149* 180* 198* 226*     Recent Results (from the pst 240 hour(s))  Resp pnel by RT-PCR (RSV, Flu &B, Covid) nterior Nsl Swb     Sttus: None   Collection Time: 03/31/23 12:50 M   Specimen: nterior Nsl Swb  Result Vlue Ref Rnge Sttus   SRS Coronvirus 2 by RT PCR NEGTIVE NEGTIVE Finl   Influenz  by PCR NEGTIVE NEGTIVE Finl   Influenz B by PCR NEGTIVE NEGTIVE Finl    Comment: (NOTE) The Xpert Xpress SRS-CoV-2/FLU/RSV plus ssy is intended s n id in the dignosis of influenz from Nsophryngel swb specimens nd should not be used s  sole bsis for tretment. Nsl wshings nd spirtes re uncceptble for Xpert Xpress SRS-CoV-2/FLU/RSV testing.  Fct Sheet for Ptients: [email or web address]  Fct Sheet for [place] Providers: [email or web address].[email or web address]  This test is not yet pproved or clered by the [facility] nd hs been uthorized for detection nd/or dignosis of SRS-CoV-2 by FD under n Emergency Use  uthoriztion (EU). This EU will remin in effect (mening this test cn be used) for the durtion of the COVID-19 declrtion under Section 564(b)(1) of the [place], 21 U.S.C. section 360bbb-3(b)(1), unless the uthoriztion is terminted or revoked.     Resp Syncytil Virus by PCR NEGTIVE NEGTIVE Finl    Comment: (NOTE) Fct Sheet for Ptients: [email or web address]  Fct Sheet for [facility] Providers: [email or web address].it  This test is not yet pproved or clered by the [facility] nd hs been uthorized for detection nd/or dignosis of SRS-CoV-2 by FD under n Emergency Use uthoriztion (EU). This EU will remin in effect (mening this test cn be used) for the durtion of the COVID-19 declrtion under Section 564(b)(1) of the ct, 21 U.S.C. section 360bbb-3(b)(1), unless the uthoriztion is terminted or revoked.  Performed t [facility], [address] [place], [place] [address]   Blood Culture (routine x 2)     Sttus: None (Preliminry result)   Collection Time: 03/31/23 12:50 M   Specimen: BLOOD RIGHT RM  Result Vlue Ref Rnge Sttus   Specimen Description BLOOD RIGHT RM  Finl   Specil Requests   Finl    BOTTLES DRWN EROBIC ND NEROBIC Blood Culture dequte volume   Culture   Finl    NO GROWTH 2 DYS Performed t [facility], [address] [place], [place] [address]    Report Sttus PENDING  Incomplete  Blood Culture (routine x 2)     Sttus: None (Preliminry result)   Collection Time: 03/31/23  1:19 M   Specimen: BLOOD RIGHT HND  Result Vlue Ref Rnge Sttus   Specimen Description BLOOD RIGHT HND  Finl   Specil Requests   Finl    BOTTLES DRWN EROBIC ONLY Blood Culture results my not be optiml due to n indequte volume of blood received in culture bottles   Culture   Finl    NO GROWTH 2 DYS Performed t [facility], [address], [place],  [place] **Note De-Identified vi Obfusction** [id number]    Report Sttus PENDING  Incomplete         Rdiology Studies: CT CHEST WO CONTRAST  Result Dte: 04/01/2023 CLINICAL DATA:  Respirtory illness, nondignostic xry EXAM: CT CHEST WITHOUT CONTRAST TECHNIQUE: Multidetector CT imging of the chest ws performed following the stndrd protocol without IV contrst. RADIATION DOSE REDUCTION: This exm ws performed ccording to the deprtmentl dose-optimiztion progrm which includes utomted exposure control, djustment of the mA nd/or kV ccording to ptient size nd/or use of itertive reconstruction technique. COMPARISON:  Chest rdiogrph yesterdy. FINDINGS: Crdiovsculr: Right-sided dilysis ctheter tip in the right trium. Hert is upper norml in size. There re coronry rtery clcifictions. Mild therosclerosis of the thorcic ort. No ortic neurysm. Medistinum/Nodes: Scttered medistinl lymph nodes re not enlrged by size criteri. Limited hilr ssessment in the bsence of IV contrst. No esophgel wll thickening. Lungs/Pleur: Moderte-sized right pleurl effusion mesures simple fluid density. Associted compressive telectsis. There is  trce left pleurl effusion. Mild volume loss in the medil left lower lobe, no centrlly obstructing lesion. No fetures of pulmonry edem. No suspicious pulmonry nodule or pulmonry mss. Upper Abdomen: Assessed on yesterdy's CT. Cholecystostomy tube in the gllbldder. There my be slight incresed strndy density nd fluid djcent to the cholecystostomy tube, series 3, imge 118. Musculoskeletl: Thorcic spondylosis with spurring. There re no cute or suspicious osseous bnormlities. IMPRESSION: 1. Moderte-sized right pleurl effusion mesures simple fluid density. Associted compressive telectsis. Trce left pleurl effusion. 2. Cholecystostomy tube in the gllbldder. There my be slight incresed strndy density nd fluid djcent to the cholecystostomy tube compred  yesterdy's CT. Aortic Atherosclerosis (ICD10-I70.0). Electroniclly Signed   By: Nrd Rutherford M.D.   On: 04/01/2023 21:42        Scheduled Meds:  pixbn  5 mg Per Tube BID   biscodyl  5 mg Orl BID   crvedilol  3.125 mg Orl BID WC   Chlorhexidine Gluconte Cloth  6 ech Topicl Q0600   feeding supplement  237 mL Orl Q24H   feeding supplement (PROSource TF20)  60 mL Per Tube Dily   free wter  40 mL Per Tube Q2H   insulin sprt  0-6 Units Subcutneous Q4H   insulin glrgine-yfgn  5 Units Subcutneous QHS   polyethylene glycol  17 g Per Tube BID   sodium chloride flush  10-40 mL Intrctheter Q12H   vncomycin vrible dose per unstble renl function (phrmcist dosing)   Does not pply See dmin instructions   Continuous Infusions:  cefTRIAXone (ROCEPHIN)  IV 1 g (04/02/23 0207)   feeding supplement (NEPRO CARB STEADY) 1,000 mL (04/02/23 1334)   metronidzole 500 mg (04/02/23 0915)   [START ON 04/05/2023] vncomycin       LOS: 2 dys    Time spent: oer 30 min    Lcreti Nicks, MD [facility]   To contct the ttending provider between 7A-7P or the covering provider during fter hours 7P-7A, plese log into the web site [email or web address] nd ccess using universl [employer] pssword for tht web site. If you do not hve the pssword, plese cll the hospitl opertor.  04/02/2023, 3:40 PM

## 2023-04-03 DIAGNOSIS — R509 Fever, unspecified: Secondary | ICD-10-CM

## 2023-04-03 LAB — CBC
HCT: 30.5 % — ABNORMAL LOW (ref 36.0–46.0)
Hemoglobin: 9.7 g/dL — ABNORMAL LOW (ref 12.0–15.0)
MCH: 30.8 pg (ref 26.0–34.0)
MCHC: 31.8 g/dL (ref 30.0–36.0)
MCV: 96.8 fL (ref 80.0–100.0)
Platelets: 158 10*3/uL (ref 150–400)
RBC: 3.15 MIL/uL — ABNORMAL LOW (ref 3.87–5.11)
RDW: 14.1 % (ref 11.5–15.5)
WBC: 7.4 10*3/uL (ref 4.0–10.5)
nRBC: 0 % (ref 0.0–0.2)

## 2023-04-03 LAB — RENAL FUNCTION PANEL
Albumin: 2.4 g/dL — ABNORMAL LOW (ref 3.5–5.0)
Anion gap: 13 (ref 5–15)
BUN: 15 mg/dL (ref 8–23)
CO2: 28 mmol/L (ref 22–32)
Calcium: 8.4 mg/dL — ABNORMAL LOW (ref 8.9–10.3)
Chloride: 94 mmol/L — ABNORMAL LOW (ref 98–111)
Creatinine, Ser: 1.43 mg/dL — ABNORMAL HIGH (ref 0.44–1.00)
GFR, Estimated: 38 mL/min — ABNORMAL LOW (ref >60)
Glucose, Bld: 181 mg/dL — ABNORMAL HIGH (ref 70–99)
Phosphorus: 1.5 mg/dL — ABNORMAL LOW (ref 2.5–4.6)
Potassium: 3.3 mmol/L — ABNORMAL LOW (ref 3.5–5.1)
Sodium: 135 mmol/L (ref 135–145)

## 2023-04-03 LAB — GLUCOSE, CAPILLARY
Glucose-Capillary: 277 mg/dL — ABNORMAL HIGH (ref 70–99)
Glucose-Capillary: 264 mg/dL — ABNORMAL HIGH (ref 70–99)
Glucose-Capillary: 174 mg/dL — ABNORMAL HIGH (ref 70–99)

## 2023-04-03 MED ORDER — LIDOCAINE-PRILOCAINE 2.5-2.5 % EX CREA: 1.0000 | TOPICAL_CREAM | CUTANEOUS | Status: DC | PRN

## 2023-04-03 MED ORDER — HEPARIN SODIUM (PORCINE) 1000 UNIT/ML IJ SOLN
1500.0000 [IU] | Freq: Once | INTRAMUSCULAR | Status: AC
  Administered 2023-04-03: 1500 [IU]

## 2023-04-03 MED ORDER — VANCOMYCIN HCL 500 MG/100ML IV SOLN
500.0000 mg | INTRAVENOUS | Status: AC
  Administered 2023-04-03: 500 mg via INTRAVENOUS
  Filled 2023-04-03: qty 100

## 2023-04-03 MED ORDER — HEPARIN SODIUM (PORCINE) 1000 UNIT/ML IJ SOLN: 2500.0000 [IU] | Freq: Once | INTRAMUSCULAR | Status: AC

## 2023-04-03 MED ORDER — LIDOCAINE HCL (PF) 1 % IJ SOLN: 5.0000 mL | INTRAMUSCULAR | Status: DC | PRN

## 2023-04-03 MED ORDER — INSULIN GLARGINE-YFGN 100 UNIT/ML ~~LOC~~ SOLN
10.0000 [IU] | Freq: Every day | SUBCUTANEOUS | Status: DC
  Administered 2023-04-03 – 2023-04-06 (×4): 10 [IU] via SUBCUTANEOUS
  Filled 2023-04-03 (×5): qty 0.1

## 2023-04-03 MED ORDER — HEPARIN SODIUM (PORCINE) 1000 UNIT/ML DIALYSIS
1000.0000 [IU] | INTRAMUSCULAR | Status: DC | PRN
  Administered 2023-04-03: 4500 [IU] via INTRAVENOUS_CENTRAL
  Administered 2023-04-03: 2500 [IU] via INTRAVENOUS_CENTRAL
  Filled 2023-04-03: qty 1

## 2023-04-03 MED ORDER — SODIUM PHOSPHATES 45 MMOLE/15ML IV SOLN: 15.0000 mmol | Freq: Once | INTRAVENOUS | Status: DC

## 2023-04-03 MED ORDER — PENTAFLUOROPROP-TETRAFLUOROETH EX AERO: 1.0000 | INHALATION_SPRAY | CUTANEOUS | Status: DC | PRN

## 2023-04-03 MED ORDER — ALTEPLASE 2 MG IJ SOLR: 2.0000 mg | Freq: Once | INTRAMUSCULAR | Status: DC | PRN

## 2023-04-03 NOTE — Progress Notes (Addendum)
[facility] Progress Note   Subjective:   PT seen in room. Reports she is sleepy, no other concerns. Denies SOB, dizziness, N/V/D. She had hypoglycemia overnight.   Objective Vitals:   04/02/23 1902 04/03/23 0818 04/03/23 0824 04/03/23 0838  BP: 110/60 (!) 145/83  (!) 148/74  Pulse: 92  100 97  Resp: 20 20  19   Temp: 98.3 F (36.8 C) 98.9 F (37.2 C)  98.7 F (37.1 C)  TempSrc: Oral Oral    SpO2: 100% 98%  98%  Weight:       Physical Exam General: Sleeping, awakens to voice and answers questions appropriately, NAD Heart: RRR, no murmurs, rubs or gallops Lungs: CTA bilaterally Abdomen: Soft, non-distended, +BS Extremities: No edema b/l lower extremities Dialysis Access:  [facility]  Additional Objective Labs: Basic Metabolic Panel: Recent Labs  Lab 03/31/23 0050 03/31/23 0915 04/01/23 0354 04/02/23 0605 04/02/23 1737  NA 133*  --  138 136  --   K 3.5  --  3.1* 3.5  --   CL 92*  --  98 98  --   CO2 29  --  25 26  --   GLUCOSE 129*  --  88 194*  --   BUN 24*  --  37* 22  --   CREATININE 3.19* 3.39* 3.76* 2.25*  --   CALCIUM 9.1  --  8.3* 8.1*  --   PHOS  --   --  1.8* 1.3* 1.7*   Liver Function Tests: Recent Labs  Lab 03/31/23 0050 04/01/23 0354 04/02/23 0605  AST 22 25 44*  ALT 18 19 25   ALKPHOS 91 81 81  BILITOT 0.6 0.4 0.4  PROT 6.9 5.7* 6.0*  ALBUMIN 3.2* 2.5* 2.5*   No results for input(s): "LIPASE", "AMYLASE" in the last 168 hours. CBC: Recent Labs  Lab 03/31/23 0050 03/31/23 0915 04/01/23 0354 04/02/23 0605  WBC 14.7* 12.0* 10.2 6.0  NEUTROABS 10.3*  --   --   --   HGB 11.5* 10.4* 9.8* 10.5*  HCT 36.5 32.4* 30.6* 32.8*  MCV 99.7 101.3* 97.8 100.0  PLT 159 138* 131* 138*   Blood Culture    Component Value Date/Time   SDES BLOOD RIGHT HAND 03/31/2023 0119   SPECREQUEST  03/31/2023 0119    BOTTLES DRAWN AEROBIC ONLY Blood Culture results may not be optimal due to an inadequate volume of blood received in culture bottles    CULT  03/31/2023 0119    NO GROWTH 3 DAYS Performed at [facility], [address] [place], [place] [address]    REPTSTATUS PENDING 03/31/2023 0119    Cardiac Enzymes: No results for input(s): "CKTOTAL", "CKMB", "CKMBINDEX", "TROPONINI" in the last 168 hours. CBG: Recent Labs  Lab 04/02/23 0717 04/02/23 1115 04/02/23 1636 04/02/23 2048 04/03/23 0721  GLUCAP 198* 226* 219* 193* 277*   Iron Studies: No results for input(s): "IRON", "TIBC", "TRANSFERRIN", "FERRITIN" in the last 72 hours. @lablastinr3 @ Studies/Results: CT CHEST WO CONTRAST  Result Date: 04/01/2023 CLINICAL DATA:  Respiratory illness, nondiagnostic xray EXAM: CT CHEST WITHOUT CONTRAST TECHNIQUE: Multidetector CT imaging of the chest was performed following the standard protocol without IV contrast. RADIATION DOSE REDUCTION: This exam was performed according to the departmental dose-optimization program which includes automated exposure control, adjustment of the mA and/or kV according to patient size and/or use of iterative reconstruction technique. COMPARISON:  Chest radiograph yesterday. FINDINGS: Cardiovascular: Right-sided dialysis catheter tip in the right atrium. Heart is upper normal in size. There  are coronary artery calcifications. Mild atherosclerosis of the thoracic aorta. No aortic aneurysm. Mediastinum/Nodes: Scattered mediastinal lymph nodes are not enlarged by size criteria. Limited hilar assessment in the absence of IV contrast. No esophageal wall thickening. Lungs/Pleura: Moderate-sized right pleural effusion measures simple fluid density. Associated compressive atelectasis. There is a trace left pleural effusion. Mild volume loss in the medial left lower lobe, no centrally obstructing lesion. No features of pulmonary edema. No suspicious pulmonary nodule or pulmonary mass. Upper Abdomen: Assessed on yesterday's CT. Cholecystostomy tube in the gallbladder. There may be slight increased strandy density and  fluid adjacent to the cholecystostomy tube, series 3, image 118. Musculoskeletal: Thoracic spondylosis with spurring. There are no acute or suspicious osseous abnormalities. IMPRESSION: 1. Moderate-sized right pleural effusion measures simple fluid density. Associated compressive atelectasis. Trace left pleural effusion. 2. Cholecystostomy tube in the gallbladder. There may be slight increased strandy density and fluid adjacent to the cholecystostomy tube compared yesterday's CT. Aortic Atherosclerosis (ICD10-I70.0). Electronically Signed   By: Narda Rutherford M.D.   On: 04/01/2023 21:42   Medications:  cefTRIAXone (ROCEPHIN)  IV 1 g (04/03/23 0222)   feeding supplement (NEPRO CARB STEADY) 1,000 mL (04/02/23 1334)   metronidazole 500 mg (04/03/23 0820)   [START ON 04/05/2023] vancomycin     vancomycin      bisacodyl  5 mg Oral BID   carvedilol  3.125 mg Oral BID WC   Chlorhexidine Gluconate Cloth  6 each Topical Q0600   feeding supplement  237 mL Oral Q24H   feeding supplement (PROSource TF20)  60 mL Per Tube Daily   free water  40 mL Per Tube Q2H   insulin aspart  0-6 Units Subcutaneous Q4H   insulin glargine-yfgn  5 Units Subcutaneous QHS   polyethylene glycol  17 g Per Tube BID   sodium chloride flush  10-40 mL Intracatheter Q12H   vancomycin variable dose per unstable renal function (pharmacist dosing)   Does not apply See admin instructions    OP Dialysis Orders: MWF SW  3h   350/600   59kg   2/2 bath  TDC   Heparin 2500+ 1500 midrun - last OP HD 8/05, post wt 56.2kg   - no esa, no vdra - last Hb 12.2, pth 179    Assessment/Plan: SIRS/ fever/ ^wbc - unclear source, sig constipation per abd CT, but chole tube appeared to be in proper position. Blood cx's pending on IV abx. Per pmd.  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 Using [facility]. Was off schedule this week, resume MWF schedule.  HTN/ volume - euvolemic on exam, CXR w/o  edema, on RA. BP's wnl.  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  H/o CVA - w/ subsequent PEG tube. She is on nepro. RD increased TF yesterday, if still low over the next few days may need to try a different formulation or a daily PO supplement H/o dementia  Rogers Blocker, PA-C 04/03/2023, 11:30 AM  [facility] Pager: [phone]

## 2023-04-03 NOTE — Progress Notes (Signed)
Pharmacy Antibiotic Note  Samantha [name] is a 77 y.o. female admitted on 03/30/2023 presenting with abdominal pain, hx cholecystostomy, concern for intra-abdominal infection.  Pharmacy has been consulted for vancomycin dosing.  ESRD-HD usually MWF.  She is currently off HD schedule. Last HD session was 8/8 and received vancomycin 500 mg after that session. She is to receive HD today, 8/10, and then resume MWF schedule next week.  Plan: Continue Vancomycin 500 mg IV q HD Monitor HD schedule, Cx and clinical progression to narrow Vancomycin random level as needed Follow-up duration of treatment   Weight: 61.2 kg (134 lb 14.7 oz)  Temp (24hrs), Avg:98.5 F (36.9 C), Min:98.2 F (36.8 C), Max:98.9 F (37.2 C)  Recent Labs  Lab 03/31/23 0050 03/31/23 0103 03/31/23 0228 03/31/23 0915 04/01/23 0354 04/02/23 0605  WBC 14.7*  --   --  12.0* 10.2 6.0  CREATININE 3.19*  --   --  3.39* 3.76* 2.25*  LATICACIDVEN  --  1.5 1.3  --   --   --     Estimated Creatinine Clearance: 16.9 mL/min (A) (by C-G formula based on SCr of 2.25 mg/dL (H)).    Allergies  Allergen Reactions   Aspirin Anaphylaxis   Penicillins Anaphylaxis    **Tolerates cephalosporins   Antimicrobials this admission: Ceftriaxone 8/8 >>  Metronidazole 8/7 >>  Vancomycin 8/7>>  Dose adjustments this admission: None  Microbiology results: 8/7 BCx: NGTD   Lennie Muckle, PharmD PGY1 Pharmacy Resident 04/03/2023 10:04 AM

## 2023-04-03 NOTE — Progress Notes (Signed)
**Note De-Identified vi Obfusction** PROGRESS NOTE    Samantha [name]  NUU:[id number] DOB: April 02, 1946 DOA: 03/30/2023 PCP: Slvtore Decent, FNP  Chief Complint  [name] presents with   Abdominl Pin   Vomiting    Brief Nrrtive:   Samantha [name] is  77 y.o. femle with medicl history significnt of cute cholecystitis with cholelithisis sttus post lproscopic cholecystostomy plcement in April 2024, systolic CHF EF 40%, ESRD on HD, CVA with left-sided hemipresis, dysphgi with PEG tube feeding, insulin-dependent DM2 comes to the hospitl with fever.   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 Slight incresed strndy density nd fluid djcent to cholecystostomy tube?) - will consult pulmonry regrding possible thorcentesis, hold eliquis for now  - pprecite pulm, possible procedure tody? Will see  - UA with negtive nitrite, + LE, RBC's ---> no urine culture ordered - blood cultures Ngx2 dys - negtive covid, influenz, RSV - will nrrow bx to ceftrixone lone nd follow closely, low threshold to restrt    Fecl Impction  Constiption - ggressive bowel regimen    History of clculous colitis - Underwent cholecystostomy tube plcement in April 2024, follows outptient IR.  This hs been dislodged in between nd exchnged.  Currently output looks oky, we will reconsult IR to ensure  proper functioning of this.  Dringe t this time does not pper to be infected. 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 but tube ppers to be stble with decompressed gllbldder --pprecite IR, drin pproprite   History of CVA with left-sided hemipresis Dysphgi getting PEG tube - [name] ws lredy on Eliquis (on hold for now) -- nepro t 40 ml/hr, 20 ml hr FWF    Insulin-dependent dibetes mellitus type 2 - Sliding scle, Accu-Chek.  Will reduce home long-cting from 16 units to 10 units nd SSI.  This cn slowly be djusted while ptient on tube feeds.    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  Continue Coreg.  IV s needed   History of DVT - IVC filter in plce.  On Eliquis which we will continue    DVT prophylxis: eliquis Code Sttus: full Fmily Communiction: none - clled dughter jocelyn, no nswer Disposition:   Sttus is: Inptient Remins inptient pproprite becuse: need for continued inptient cre   Consultnts:  renl  Procedures:  none  Antimicrobils:  Anti-infectives (From dmission, onwrd)    Strt     Dose/Rte Route Frequency Ordered Stop   04/08/23 1200  vncomycin (VANCOREADY) IVPB 500 mg/100 mL  Sttus:  Discontinued        500 mg 100 mL/hr over 60 Minutes Intrvenous Every Thu (Hemodilysis) 04/01/23 1336 04/01/23 1416   04/05/23 1200  vncomycin (VANCOREADY) IVPB 500 mg/100 mL        500 mg 100 mL/hr over 60  Minutes Intravenous Every M-W-F (Hemodialysis) 04/02/23 1120     04/03/23 1200  vancomycin (VANCOREADY) IVPB 500 mg/100 mL        500 mg 100 mL/hr over 60 Minutes Intravenous Every Sat (Hemodialysis) 04/03/23 1000 04/03/23 1612   04/02/23 1200  vancomycin (VANCOREADY) IVPB 500 mg/100 mL  Status:  Discontinued        500 mg 100 mL/hr over 60 Minutes Intravenous Every M-W-F (Hemodialysis)  03/31/23 0929 04/02/23 1120   04/02/23 1122  vancomycin variable dose per unstable renal function (pharmacist dosing)  Status:  Discontinued         Does not apply See admin instructions 04/02/23 1123 04/03/23 1209   04/01/23 1800  vancomycin (VANCOREADY) IVPB 500 mg/100 mL        500 mg 100 mL/hr over 60 Minutes Intravenous Every Thu (Hemodialysis) 04/01/23 1416 04/01/23 1721   04/01/23 0200  cefTRIAXone (ROCEPHIN) 1 g in sodium chloride 0.9 % 100 mL IVPB        1 g 200 mL/hr over 30 Minutes Intravenous Every 24 hours 03/31/23 0919     03/31/23 1400  metroNIDAZOLE (FLAGYL) IVPB 500 mg        500 mg 100 mL/hr over 60 Minutes Intravenous Every 12 hours 03/31/23 0919     03/31/23 0930  vancomycin (VANCOREADY) IVPB 1250 mg/250 mL        1,250 mg 166.7 mL/hr over 90 Minutes Intravenous  Once 03/31/23 0929 03/31/23 1134   03/31/23 0030  cefTRIAXone (ROCEPHIN) 2 g in sodium chloride 0.9 % 100 mL IVPB        2 g 200 mL/hr over 30 Minutes Intravenous  Once 03/31/23 0016 03/31/23 0221   03/31/23 0030  metroNIDAZOLE (FLAGYL) IVPB 500 mg        500 mg 100 mL/hr over 60 Minutes Intravenous  Once 03/31/23 0016 03/31/23 0334       Subjective: Denies any complaints  Objective: Vitals:   04/03/23 1600 04/03/23 1626 04/03/23 1638 04/03/23 1642  BP: 138/72 (!) 148/71  (!) 142/60  Pulse: 88 79  81  Resp: (!) 25 (!) 21  (!) 21  Temp:  97.9 F (36.6 C)    TempSrc:  Oral    SpO2: 100% 99%  99%  Weight:   61.6 kg     Intake/Output Summary (Last 24 hours) at 04/03/2023 1715 Last data filed at 04/03/2023 1639 Gross per 24 hour  Intake 90 ml  Output 893.26 ml  Net -803.26 ml   Filed Weights   04/01/23 1856 04/03/23 1245 04/03/23 1638  Weight: 61.2 kg 60.8 kg 61.6 kg    Examination:  General: No acute distress. Seen during dialysis Lungs: unlabored Abdomen: Soft, nontender, nondistended Neurological: Alert. Moves all extremities 4 with equal strength. Cranial nerves II through XII  grossly intact. Extremities: No clubbing or cyanosis. No edema.  Data Reviewed: I have personally reviewed following labs and imaging studies  CBC: Recent Labs  Lab 03/31/23 0050 03/31/23 0915 04/01/23 0354 04/02/23 0605 04/03/23 1321  WBC 14.7* 12.0* 10.2 6.0 7.1  NEUTROABS 10.3*  --   --   --  4.6  HGB 11.5* 10.4* 9.8* 10.5* 9.1*  HCT 36.5 32.4* 30.6* 32.8* 28.6*  MCV 99.7 101.3* 97.8 100.0 99.0  PLT 159 138* 131* 138* 151    Basic Metabolic Panel: Recent Labs  Lab 03/31/23 0050 03/31/23 0915 04/01/23 0354 04/02/23 0605 04/02/23 1737 04/03/23 1321  NA 133*  --  138 136  --  134* **Note De-Identified vi Obfution** K 3.5  --  3.1* 3.5  --  3.3*  CL 92*  --  98 98  --  97*  CO2 29  --  25 26  --  24  GLUCOSE 129*  --  88 194*  --  284*  BUN 24*  --  37* 22  --  43*  CRETININE 3.19* 3.39* 3.76* 2.25*  --  2.90*  CLCIUM 9.1  --  8.3* 8.1*  --  8.5*  MG  --   --  2.0 1.8 1.9 2.0  PHOS  --   --  1.8* 1.3* 1.7* 3.0    GFR: Estimted Cretinine Clernce: 13.2 mL/min () (by C-G formul bsed on SCr of 2.9 mg/dL (H)).  Liver Function Tests: Recent Lbs  Lb 03/31/23 0050 04/01/23 0354 04/02/23 0605 04/03/23 1321  ST 22 25 44* 24  LT 18 19 25 20   LKPHOS 91 81 81 76  BILITOT 0.6 0.4 0.4 0.3  PROT 6.9 5.7* 6.0* 5.6*  LBUMIN 3.2* 2.5* 2.5* 2.3*    CBG: Recent Lbs  Lb 04/02/23 1115 04/02/23 1636 04/02/23 2048 04/03/23 0721 04/03/23 1154  GLUCP 226* 219* 193* 277* 264*     Recent Results (from the pst 240 hour(s))  Resp pnel by RT-PCR (RSV, Flu &B, Covid) nterior Nsl Swb     Sttus: None   Collection Time: 03/31/23 12:50 M   Specimen: nterior Nsl Swb  Result Vlue Ref Rnge Sttus   SRS Coronvirus 2 by RT PCR NEGTIVE NEGTIVE Finl   Influenz  by PCR NEGTIVE NEGTIVE Finl   Influenz B by PCR NEGTIVE NEGTIVE Finl    Comment: (NOTE) The Xpert Xpress SRS-CoV-2/FLU/RSV plus ssy is intended s n id in the dignosis of influenz from Nsophryngel  swb specimens nd should not be used s  sole bsis for tretment. Nsl wshings nd spirtes re uncceptble for Xpert Xpress SRS-CoV-2/FLU/RSV testing.  Fct Sheet for Ptients: [email or web address]  Fct Sheet for [facility] Providers: [email or web address].[email or web address]  This test is not yet pproved or clered by the [facility] nd hs been uthorized for detection nd/or dignosis of SRS-CoV-2 by FD under n Emergency Use uthoriztion (EU). This EU will remin in effect (mening this test cn be used) for the durtion of the COVID-19 declrtion under Section 564(b)(1) [place] the [place], 21 U.S.C. section 360bbb-3(b)(1), unless the uthoriztion is terminted or revoked.     Resp Syncytil Virus by PCR NEGTIVE NEGTIVE Finl    Comment: (NOTE) Fct Sheet for Ptients: [email or web address]  Fct Sheet for [facility] Providers: [email or web address].it  This test is not yet pproved or clered by the [facility] nd hs been uthorized for detection nd/or dignosis of SRS-CoV-2 by FD under n Emergency Use uthoriztion (EU). This EU will remin in effect (mening this test cn be used) for the durtion of the COVID-19 declrtion under Section 564(b)(1) of the ct, 21 U.S.C. section 360bbb-3(b)(1), unless the uthoriztion is terminted or revoked.  Performed t [facility], [address] [place], [place] [address]   Blood Culture (routine x 2)     Sttus: None (Preliminry result)   Collection Time: 03/31/23 12:50 M   Specimen: BLOOD RIGHT RM  Result Vlue Ref Rnge Sttus   Specimen Deription BLOOD RIGHT RM  Finl   Specil Requests   Finl    BOTTLES DRWN EROBIC ND NEROBIC Blood Culture dequte volume   Culture   Finl    NO GROWTH 3 DYS Performed t [facility], **Note De-Identified vi Obfusction** [address], [place], [place] [address]    Report Sttus PENDING  Incomplete   Blood Culture (routine x 2)     Sttus: None (Preliminry result)   Collection Time: 03/31/23  1:19 AM   Specimen: BLOOD RIGHT HAND  Result Vlue Ref Rnge Sttus   Specimen Description BLOOD RIGHT HAND  Finl   Specil Requests   Finl    BOTTLES DRAWN AEROBIC ONLY Blood Culture results my not be optiml due to n indequte volume of blood received in culture bottles   Culture   Finl    NO GROWTH 3 DAYS Performed t [facility], [address] [place], [place] [address]    Report Sttus PENDING  Incomplete         Rdiology Studies: CT CHEST WO CONTRAST  Result Dte: 04/01/2023 CLINICAL DATA:  Respirtory illness, nondignostic xry EXAM: CT CHEST WITHOUT CONTRAST TECHNIQUE: Multidetector CT imging of the chest ws performed following the stndrd protocol without IV contrst. RADIATION DOSE REDUCTION: This exm ws performed ccording to the deprtmentl dose-optimiztion progrm which includes utomted exposure control, djustment of the mA nd/or kV ccording to ptient size nd/or use of itertive reconstruction technique. COMPARISON:  Chest rdiogrph yesterdy. FINDINGS: Crdiovsculr: Right-sided dilysis ctheter tip in the right trium. Hert is upper norml in size. There re coronry rtery clcifictions. Mild therosclerosis of the thorcic ort. No ortic neurysm. Medistinum/Nodes: Scttered medistinl lymph nodes re not enlrged by size criteri. Limited hilr ssessment in the bsence of IV contrst. No esophgel wll thickening. Lungs/Pleur: Moderte-sized right pleurl effusion mesures simple fluid density. Associted compressive telectsis. There is  trce left pleurl effusion. Mild volume loss in the medil left lower lobe, no centrlly obstructing lesion. No fetures of pulmonry edem. No suspicious pulmonry nodule or pulmonry mss. Upper Abdomen: Assessed on yesterdy's CT. Cholecystostomy tube in the gllbldder. There my be slight incresed  strndy density nd fluid djcent to the cholecystostomy tube, series 3, imge 118. Musculoskeletl: Thorcic spondylosis with spurring. There re no cute or suspicious osseous bnormlities. IMPRESSION: 1. Moderte-sized right pleurl effusion mesures simple fluid density. Associted compressive telectsis. Trce left pleurl effusion. 2. Cholecystostomy tube in the gllbldder. There my be slight incresed strndy density nd fluid djcent to the cholecystostomy tube compred yesterdy's CT. Aortic Atherosclerosis (ICD10-I70.0). Electroniclly Signed   By: Nrd Rutherford M.D.   On: 04/01/2023 21:42        Scheduled Meds:  biscodyl  5 mg Orl BID   crvedilol  3.125 mg Orl BID WC   Chlorhexidine Gluconte Cloth  6 ech Topicl Q0600   feeding supplement  237 mL Orl Q24H   feeding supplement (PROSource TF20)  60 mL Per Tube Dily   free wter  40 mL Per Tube Q2H   insulin sprt  0-6 Units Subcutneous Q4H   insulin glrgine-yfgn  5 Units Subcutneous QHS   polyethylene glycol  17 g Per Tube BID   sodium chloride flush  10-40 mL Intrctheter Q12H   Continuous Infusions:  cefTRIAXone (ROCEPHIN)  IV 1 g (04/03/23 0222)   feeding supplement (NEPRO CARB STEADY) 1,000 mL (04/02/23 1334)   metronidzole 500 mg (04/03/23 0820)   [START ON 04/05/2023] vncomycin       LOS: 3 dys    Time spent: oer 30 min    Lcreti Nicks, MD [facility]   To contct the ttending provider between 7A-7P or the covering provider during fter hours 7P-7A, plese log into the web site [email or web address] nd ccess  using universal [place] password for that web site. If you do not have the password, please call the hospital operator.  04/03/2023, 5:15 PM

## 2023-04-03 NOTE — Progress Notes (Signed)
Received patient in bed to unit.  Alert and oriented.  Informed consent signed and in chart.   TX duration: 3 hours  Patient tolerated well.  Transported back to the room  Alert, without acute distress.  Hand-off given to patient's nurse.   Access used: RIJ Cath Access issues: none  Total UF removed: Medication(s) given: Vancomycin, see MAR   04/03/23 1626  Vitals  Temp 97.9 F (36.6 C)  Temp Source Oral  BP (!) 148/71  MAP (mmHg) 94  BP Location Left Arm  BP Method Automatic  Patient Position (if appropriate) Lying  Pulse Rate 79  Pulse Rate Source Monitor  ECG Heart Rate 79  Resp (!) 21  MEWS COLOR  MEWS Score Color Green  Oxygen Therapy  SpO2 99 %  O2 Device Room Air  MEWS Score  MEWS Temp 0  MEWS Systolic 0  MEWS Pulse 0  MEWS RR 1  MEWS LOC 0  MEWS Score 1     Stacie Glaze LPN [facility]

## 2023-04-03 NOTE — Progress Notes (Signed)
Paged on-call provider for pulmonology again, per Pts. Daughter's request.

## 2023-04-03 NOTE — Progress Notes (Addendum)
Attempted to give report to floor nurse twice.  Nurse was busy and will call back for report.  Stacie Glaze LPN-[facility]

## 2023-04-03 NOTE — Progress Notes (Signed)
Pt. Daughter asking to speak with Pulmonology. On call provider paged.

## 2023-04-04 ENCOUNTER — Inpatient Hospital Stay (HOSPITAL_COMMUNITY): Payer: Medicare HMO

## 2023-04-04 DIAGNOSIS — A419 Sepsis, unspecified organism: Secondary | ICD-10-CM | POA: Diagnosis not present

## 2023-04-04 DIAGNOSIS — J90 Pleural effusion, not elsewhere classified: Secondary | ICD-10-CM | POA: Diagnosis not present

## 2023-04-04 LAB — CBC WITH DIFFERENTIAL/PLATELET
Abs Immature Granulocytes: 0.13 10*3/uL — ABNORMAL HIGH (ref 0.00–0.07)
Basophils Absolute: 0 10*3/uL (ref 0.0–0.1)
Basophils Relative: 0 %
Eosinophils Absolute: 0.1 10*3/uL (ref 0.0–0.5)
Eosinophils Relative: 2 %
HCT: 29 % — ABNORMAL LOW (ref 36.0–46.0)
Hemoglobin: 9.2 g/dL — ABNORMAL LOW (ref 12.0–15.0)
Immature Granulocytes: 2 %
Lymphocytes Relative: 26 %
Lymphs Abs: 2 10*3/uL (ref 0.7–4.0)
MCH: 31.7 pg (ref 26.0–34.0)
MCHC: 31.7 g/dL (ref 30.0–36.0)
MCV: 100 fL (ref 80.0–100.0)
Monocytes Absolute: 0.8 10*3/uL (ref 0.1–1.0)
Monocytes Relative: 10 %
Neutro Abs: 4.5 10*3/uL (ref 1.7–7.7)
Neutrophils Relative %: 60 %
Platelets: 148 10*3/uL — ABNORMAL LOW (ref 150–400)
RBC: 2.9 MIL/uL — ABNORMAL LOW (ref 3.87–5.11)
RDW: 14.1 % (ref 11.5–15.5)
WBC: 7.5 10*3/uL (ref 4.0–10.5)
nRBC: 0 % (ref 0.0–0.2)

## 2023-04-04 LAB — BODY FLUID CULTURE W GRAM STAIN

## 2023-04-04 LAB — COMPREHENSIVE METABOLIC PANEL WITH GFR
ALT: 16 U/L (ref 0–44)
AST: 29 U/L (ref 15–41)
Albumin: 2.3 g/dL — ABNORMAL LOW (ref 3.5–5.0)
Alkaline Phosphatase: 69 U/L (ref 38–126)
Anion gap: 9 (ref 5–15)
BUN: 20 mg/dL (ref 8–23)
CO2: 27 mmol/L (ref 22–32)
Calcium: 8.3 mg/dL — ABNORMAL LOW (ref 8.9–10.3)
Chloride: 96 mmol/L — ABNORMAL LOW (ref 98–111)
Creatinine, Ser: 1.72 mg/dL — ABNORMAL HIGH (ref 0.44–1.00)
GFR, Estimated: 30 mL/min — ABNORMAL LOW (ref >60)
Glucose, Bld: 174 mg/dL — ABNORMAL HIGH (ref 70–99)
Potassium: 3.5 mmol/L (ref 3.5–5.1)
Sodium: 132 mmol/L — ABNORMAL LOW (ref 135–145)
Total Bilirubin: 0.4 mg/dL (ref 0.3–1.2)
Total Protein: 5.6 g/dL — ABNORMAL LOW (ref 6.5–8.1)

## 2023-04-04 LAB — GLUCOSE, CAPILLARY
Glucose-Capillary: 115 mg/dL — ABNORMAL HIGH (ref 70–99)
Glucose-Capillary: 158 mg/dL — ABNORMAL HIGH (ref 70–99)
Glucose-Capillary: 174 mg/dL — ABNORMAL HIGH (ref 70–99)
Glucose-Capillary: 184 mg/dL — ABNORMAL HIGH (ref 70–99)
Glucose-Capillary: 191 mg/dL — ABNORMAL HIGH (ref 70–99)
Glucose-Capillary: 216 mg/dL — ABNORMAL HIGH (ref 70–99)

## 2023-04-04 LAB — LACTATE DEHYDROGENASE, PLEURAL OR PERITONEAL FLUID: LD, Fluid: 65 U/L — ABNORMAL HIGH (ref 3–23)

## 2023-04-04 LAB — MAGNESIUM: Magnesium: 1.6 mg/dL — ABNORMAL LOW (ref 1.7–2.4)

## 2023-04-04 LAB — LACTATE DEHYDROGENASE: LDH: 136 U/L (ref 98–192)

## 2023-04-04 LAB — PROTEIN, PLEURAL OR PERITONEAL FLUID: Total protein, fluid: <3 g/dL

## 2023-04-04 LAB — PHOSPHORUS: Phosphorus: 1.7 mg/dL — ABNORMAL LOW (ref 2.5–4.6)

## 2023-04-04 LAB — PROTEIN, TOTAL: Total Protein: 5.6 g/dL — ABNORMAL LOW (ref 6.5–8.1)

## 2023-04-04 MED ORDER — MAGNESIUM OXIDE -MG SUPPLEMENT 400 (240 MG) MG PO TABS
400.0000 mg | ORAL_TABLET | Freq: Every day | ORAL | Status: DC
  Administered 2023-04-04 – 2023-04-07 (×3): 400 mg via ORAL
  Filled 2023-04-04 (×3): qty 1

## 2023-04-04 MED ORDER — SODIUM PHOSPHATES 45 MMOLE/15ML IV SOLN
15.0000 mmol | Freq: Once | INTRAVENOUS | Status: AC
  Administered 2023-04-04: 15 mmol via INTRAVENOUS
  Filled 2023-04-04: qty 5

## 2023-04-04 MED ORDER — CHLORHEXIDINE GLUCONATE CLOTH 2 % EX PADS: 6.0000 | MEDICATED_PAD | Freq: Every day | CUTANEOUS | Status: DC

## 2023-04-04 MED ORDER — APIXABAN 5 MG PO TABS
5.0000 mg | ORAL_TABLET | Freq: Two times a day (BID) | ORAL | Status: DC
  Administered 2023-04-04 – 2023-04-07 (×5): 5 mg
  Filled 2023-04-04 (×5): qty 1

## 2023-04-04 MED ORDER — DARBEPOETIN ALFA 40 MCG/0.4ML IJ SOSY
40.0000 ug | PREFILLED_SYRINGE | INTRAMUSCULAR | Status: DC
  Administered 2023-04-05: 40 ug via SUBCUTANEOUS
  Filled 2023-04-04: qty 0.4

## 2023-04-04 NOTE — Plan of Care (Signed)

## 2023-04-04 NOTE — Consult Note (Signed)
NAME:  Samantha [name], MRN:  [id number], DOB:  [birth year], LOS: 4 ADMISSION DATE:  03/30/2023, CONSULTATION DATE:  8/9 REFERRING MD:  Lowell Guitar, CHIEF COMPLAINT:  fever and pleural effusion    History of Present Illness:  77 year female w/ PMH as per below. Underwent recent cholecystotomy tube placement for acute cholecystitis back  in April 2024. Was admitted from home on 8/7 w/ cc: fever and abd discomfort. Initially met SIRS criteria and was admitted w/ working dx of sepsis of unclear etiology and placed on empiric abx. CT scan obtained showed constipation, stool in rectal vault and persistent cholelithiasis but GB was decompressed, there was a small right pleural effusion w/ associated atx. She was seen in consult by IR who evaluated the GB tube, and nephro to assist w/ dialysis while in-patient. CT chest was obtained given on-going fever. This showed: moderate sized right effusion w/ associated atelectasis, also noted slight increased strandy density near the cholecystectomy tube c/w imaging 24 hrs earlier.  Cultures to date have been negative.  PCCM called to evaluate the right pleural effusion in the setting of fever.  Pertinent  Medical History  Acute cholecystitis w/ cholelithiasis s/p lap chole 2024, HFrEF (40%), ESRD on iHD, CVA w/ left sided hemiparesis, dysphagia w/ PEG, DM (insulin dep), CAD, HTN, h/o DVT has IVC in place also on Eliquis,   Significant Hospital Events: Including procedures, antibiotic start and stop dates in addition to other pertinent events   8/7 admitted w/ fever and abd pain. CT w/ stool in rectal vault. Small right effusion. Depressed GB w/ what appeared to be well positioned GB tube. Abx started  8/8 still having fever. Wbc ct better. CT chest. Obtained. Pulm asked to see d/t slightly larger pleural effusion on 8/9  Interim History / Subjective:  Denies any complaint   Objective   Blood pressure 137/63, pulse 86, temperature 98.4 F (36.9 C), temperature  source Oral, resp. rate 20, weight 61 kg, SpO2 96%.        Intake/Output Summary (Last 24 hours) at 04/04/2023 1013 Last data filed at 04/04/2023 0703 Gross per 24 hour  Intake 1080 ml  Output 943.26 ml  Net 136.74 ml   Filed Weights   04/03/23 1245 04/03/23 1638 04/04/23 0500  Weight: 60.8 kg 61.6 kg 61 kg    Examination: General: Chronically ill-appearing female, lying on the bed HEENT: [place]/AT, eyes anicteric.  moist mucus membranes Neuro: Alert, awake following commands with left hemiparesis Chest: Reduced air entry at the bases especially on right side, no wheezes or rhonchi Heart: Regular rate and rhythm, no murmurs or gallops Abdomen: Soft, nontender, nondistended, bowel sounds present.  Abdominal drain in place with straw-colored fluid in the bag Skin: No rash   Resolved Hospital Problem list     Assessment & Plan:   Right pleural effusion in context of fever  Eitiology not clear. Could be sympathetic from recent GB drain, also not uncommon to see effusion in ESRD and HFrEF. Finally consider recent other infectious process such as recent PNA?   Bedside thoracentesis performed 300 straw-colored fluid was removed Fluid was sent for labs Resume DOAC tonight Patient tolerated procedure well Will repeat x-ray chest  Best Practice (right click and "Reselect all SmartList Selections" daily)   Per primary   Labs   CBC: Recent Labs  Lab 03/31/23 0050 03/31/23 0915 04/01/23 0354 04/02/23 0605 04/03/23 1321 04/03/23 1843 04/04/23 0237  WBC 14.7*   < > 10.2 6.0 7.1 7.4 7.5  NEUTROABS 10.3*  --   --   --  4.6  --  4.5  HGB 11.5*   < > 9.8* 10.5* 9.1* 9.7* 9.2*  HCT 36.5   < > 30.6* 32.8* 28.6* 30.5* 29.0*  MCV 99.7   < > 97.8 100.0 99.0 96.8 100.0  PLT 159   < > 131* 138* 151 158 148*   < > = values in this interval not displayed.    Basic Metabolic Panel: Recent Labs  Lab 04/01/23 0354 04/02/23 0605 04/02/23 1737 04/03/23 1321 04/03/23 1843  04/03/23 1845 04/04/23 0237  NA 138 136  --  134* 135  --  132*  K 3.1* 3.5  --  3.3* 3.3*  --  3.5  CL 98 98  --  97* 94*  --  96*  CO2 25 26  --  24 28  --  27  GLUCOSE 88 194*  --  284* 181*  --  174*  BUN 37* 22  --  43* 15  --  20  CREATININE 3.76* 2.25*  --  2.90* 1.43*  --  1.72*  CALCIUM 8.3* 8.1*  --  8.5* 8.4*  --  8.3*  MG 2.0 1.8 1.9 2.0  --  1.5* 1.6*  PHOS 1.8* 1.3* 1.7* 3.0 1.5* 1.4* 1.7*   GFR: Estimated Creatinine Clearance: 22.1 mL/min (A) (by C-G formula based on SCr of 1.72 mg/dL (H)). Recent Labs  Lab 03/31/23 0103 03/31/23 0228 03/31/23 0915 04/02/23 0605 04/03/23 1321 04/03/23 1843 04/04/23 0237  WBC  --   --    < > 6.0 7.1 7.4 7.5  LATICACIDVEN 1.5 1.3  --   --   --   --   --    < > = values in this interval not displayed.    Liver Function Tests: Recent Labs  Lab 03/31/23 0050 04/01/23 0354 04/02/23 0605 04/03/23 1321 04/03/23 1843 04/04/23 0237  AST 22 25 44* 24  --  29  ALT 18 19 25 20   --  16  ALKPHOS 91 81 81 76  --  69  BILITOT 0.6 0.4 0.4 0.3  --  0.4  PROT 6.9 5.7* 6.0* 5.6*  --  5.6*  ALBUMIN 3.2* 2.5* 2.5* 2.3* 2.4* 2.3*   No results for input(s): "LIPASE", "AMYLASE" in the last 168 hours. No results for input(s): "AMMONIA" in the last 168 hours.  ABG No results found for: "PHART", "PCO2ART", "PO2ART", "HCO3", "TCO2", "ACIDBASEDEF", "O2SAT"   Coagulation Profile: Recent Labs  Lab 03/31/23 0050  INR 2.1*    Cardiac Enzymes: No results for input(s): "CKTOTAL", "CKMB", "CKMBINDEX", "TROPONINI" in the last 168 hours.  HbA1C: Hgb A1c MFr Bld  Date/Time Value Ref Range Status  03/25/2023 04:04 PM 6.3 4.6 - 6.5 % Final    Comment:    Glycemic Control Guidelines for People with Diabetes:Non Diabetic:  <6%Goal of Therapy: <7%Additional Action Suggested:  >8%   12/13/2022 07:22 PM 7.3 (H) 4.8 - 5.6 % Final    Comment:    (NOTE) Pre diabetes:          5.7%-6.4%  Diabetes:              >6.4%  Glycemic control for    <7.0% adults with diabetes     CBG: Recent Labs  Lab 04/03/23 1154 04/03/23 2032 04/04/23 0018 04/04/23 0538 04/04/23 0753  GLUCAP 264* 174* 174* 184* 216*      Cheri Fowler, MD [place] [facility] See Amion for  pager If no response to pager, please call [phone] until 7pm After 7pm, Please call E-link [phone]

## 2023-04-04 NOTE — Procedures (Signed)
Thoracentesis  Procedure Note  Samantha [name]  [id number]  Jan 20, 1946  Date:04/04/23  Time:10:10 AM   Provider Performing:    Procedure: Thoracentesis with imaging guidance ([id number])  Indication(s) Pleural Effusion  Consent Risks of the procedure as well as the alternatives and risks of each were explained to the patient and/or caregiver.  Consent for the procedure was obtained and is signed in the bedside chart  Anesthesia Topical only with 1% lidocaine    Time Out Verified patient identification, verified procedure, site/side was marked, verified correct patient position, special equipment/implants available, medications/allergies/relevant history reviewed, required imaging and test results available.   Sterile Technique Maximal sterile technique including full sterile barrier drape, hand hygiene, sterile gown, sterile gloves, mask, hair covering, sterile ultrasound probe cover (if used).  Procedure Description Ultrasound was used to identify appropriate pleural anatomy for placement and overlying skin marked.  Area of drainage cleaned and draped in sterile fashion. Lidocaine was used to anesthetize the skin and subcutaneous tissue.  300 cc's of Straw appearing fluid was drained from the right pleural space. Catheter then removed and bandaid applied to site.   Complications/Tolerance None; patient tolerated the procedure well. Chest X-ray is ordered to confirm no post-procedural complication.   EBL Minimal   Specimen(s) Pleural fluid

## 2023-04-04 NOTE — Plan of Care (Signed)
  Problem: Nutritional: Goal: Maintenance of adequate nutrition will improve Outcome: Progressing   Problem: Skin Integrity: Goal: Risk for impaired skin integrity will decrease Outcome: Progressing   Problem: Clinical Measurements: Goal: Will remain free from infection Outcome: Progressing   Problem: Pain Managment: Goal: General experience of comfort will improve Outcome: Progressing

## 2023-04-04 NOTE — Progress Notes (Signed)
**Note De-Identified vi Obfusction** PROGRESS NOTE    [name]  JXB:[id number] DOB: Jn 29, 1947 DOA: 03/30/2023 PCP: Slvtore Decent, FNP  Chief Complint  [name] presents with   Abdominl Pin   Vomiting    Brief Nrrtive:   Samantha [name] is  77 y.o. femle with medicl history significnt of cute cholecystitis with cholelithisis sttus post lproscopic cholecystostomy plcement in April 2024, systolic CHF EF 40%, ESRD on HD, CVA with left-sided hemipresis, dysphgi with PEG tube feeding, insulin-dependent DM2 comes to the hospitl with fever.   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 Slight incresed strndy density nd fluid djcent to cholecystostomy tube?) - s/p thor - will follow thor lbs - trnsudte - UA with negtive nitrite, + LE, RBC's ---> no urine culture ordered - blood cultures Ngx2 dys - negtive covid, influenz, RSV - ceftrixone lone nd follow closely, low threshold to restrt    Fecl Impction  Constiption - ggressive bowel regimen    History of clculous colitis - Underwent cholecystostomy tube plcement in April 2024, follows outptient IR.  This hs been dislodged in between nd exchnged.  Currently output looks oky, we will reconsult IR to ensure proper functioning of this.  Dringe t this time does not pper to be infected. 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  Will reduce home long-cting from 16 units to 10 units nd SSI.  This cn slowly be djusted while ptient on tube feeds.    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  Continue Coreg.  IV s needed   History of DVT - IVC filter in plce.  On Eliquis which we will continue    DVT prophylxis: eliquis Code Sttus: full Fmily Communiction: none - clled dughter jocelyn, no nswer Disposition:   Sttus is: Inptient Remins inptient pproprite becuse: need for continued inptient cre   Consultnts:  renl  Procedures:  none  Antimicrobils:  Anti-infectives (From dmission, onwrd)    Strt     Dose/Rte Route Frequency Ordered Stop   04/08/23 1200  vncomycin (VANCOREADY) IVPB 500 mg/100 mL  Sttus:  Discontinued        500 mg 100 mL/hr over 60 Minutes Intrvenous Every Thu (Hemodilysis) 04/01/23 1336 04/01/23 1416   04/05/23 1200  vncomycin (VANCOREADY) IVPB 500 mg/100 mL  Sttus:  Discontinued  500 mg 100 mL/hr over 60 Minutes Intravenous Every M-W-F (Hemodialysis) 04/02/23 1120 04/03/23 1718   04/03/23 1200  vancomycin (VANCOREADY) IVPB 500 mg/100 mL        500 mg 100 mL/hr over 60 Minutes Intravenous Every Sat (Hemodialysis) 04/03/23 1000 04/03/23 1612   04/02/23 1200  vancomycin (VANCOREADY) IVPB 500 mg/100 mL  Status:  Discontinued        500 mg 100 mL/hr  over 60 Minutes Intravenous Every M-W-F (Hemodialysis) 03/31/23 0929 04/02/23 1120   04/02/23 1122  vancomycin variable dose per unstable renal function (pharmacist dosing)  Status:  Discontinued         Does not apply See admin instructions 04/02/23 1123 04/03/23 1209   04/01/23 1800  vancomycin (VANCOREADY) IVPB 500 mg/100 mL        500 mg 100 mL/hr over 60 Minutes Intravenous Every Thu (Hemodialysis) 04/01/23 1416 04/01/23 1721   04/01/23 0200  cefTRIAXone (ROCEPHIN) 1 g in sodium chloride 0.9 % 100 mL IVPB        1 g 200 mL/hr over 30 Minutes Intravenous Every 24 hours 03/31/23 0919     03/31/23 1400  metroNIDAZOLE (FLAGYL) IVPB 500 mg  Status:  Discontinued        500 mg 100 mL/hr over 60 Minutes Intravenous Every 12 hours 03/31/23 0919 04/03/23 1718   03/31/23 0930  vancomycin (VANCOREADY) IVPB 1250 mg/250 mL        1,250 mg 166.7 mL/hr over 90 Minutes Intravenous  Once 03/31/23 0929 03/31/23 1134   03/31/23 0030  cefTRIAXone (ROCEPHIN) 2 g in sodium chloride 0.9 % 100 mL IVPB        2 g 200 mL/hr over 30 Minutes Intravenous  Once 03/31/23 0016 03/31/23 0221   03/31/23 0030  metroNIDAZOLE (FLAGYL) IVPB 500 mg        500 mg 100 mL/hr over 60 Minutes Intravenous  Once 03/31/23 0016 03/31/23 0334       Subjective: Sleepy, but no complaint  Objective: Vitals:   04/03/23 2029 04/04/23 0500 04/04/23 0536 04/04/23 0825  BP: (!) 137/56  (!) 141/65 137/63  Pulse: 85  86 86  Resp: 18  17 20   Temp: 98.4 F (36.9 C)  98.3 F (36.8 C) 98.4 F (36.9 C)  TempSrc:    Oral  SpO2: 100%  97% 96%  Weight:  61 kg      Intake/Output Summary (Last 24 hours) at 04/04/2023 1455 Last data filed at 04/04/2023 1428 Gross per 24 hour  Intake 1080 ml  Output 1315.4 ml  Net -235.4 ml   Filed Weights   04/03/23 1245 04/03/23 1638 04/04/23 0500  Weight: 60.8 kg 61.6 kg 61 kg    Examination:  General: No acute distress. Cardiovascular: RRR Lungs: unlabored Abdomen: Soft, nontender,  nondistended  Neurological: sleepy today Extremities: No clubbing or cyanosis. No edema.   Data Reviewed: I have personally reviewed following labs and imaging studies  CBC: Recent Labs  Lab 03/31/23 0050 03/31/23 0915 04/01/23 0354 04/02/23 0605 04/03/23 1321 04/03/23 1843 04/04/23 0237  WBC 14.7*   < > 10.2 6.0 7.1 7.4 7.5  NEUTROABS 10.3*  --   --   --  4.6  --  4.5  HGB 11.5*   < > 9.8* 10.5* 9.1* 9.7* 9.2*  HCT 36.5   < > 30.6* 32.8* 28.6* 30.5* 29.0*  MCV 99.7   < > 97.8 100.0 99.0 96.8 100.0  PLT 159   < > 131* 138* 151 158 **Note De-Identified vi Obfusction** 148*   < > = vlues in this intervl not displyed.    Bsic Metbolic Pnel: Recent Lbs  Lb 04/01/23 0354 04/02/23 0605 04/02/23 1737 04/03/23 1321 04/03/23 1843 04/03/23 1845 04/04/23 0237  N 138 136  --  134* 135  --  132*  K 3.1* 3.5  --  3.3* 3.3*  --  3.5  CL 98 98  --  97* 94*  --  96*  CO2 25 26  --  24 28  --  27  GLUCOSE 88 194*  --  284* 181*  --  174*  BUN 37* 22  --  43* 15  --  20  CRETININE 3.76* 2.25*  --  2.90* 1.43*  --  1.72*  CLCIUM 8.3* 8.1*  --  8.5* 8.4*  --  8.3*  MG 2.0 1.8 1.9 2.0  --  1.5* 1.6*  PHOS 1.8* 1.3* 1.7* 3.0 1.5* 1.4* 1.7*    GFR: Estimted Cretinine Clernce: 22.1 mL/min () (by [place] formul bsed on SCr of 1.72 mg/dL (H)).  Liver Function Tests: Recent Lbs  Lb 03/31/23 0050 04/01/23 0354 04/02/23 0605 04/03/23 1321 04/03/23 1843 04/04/23 0237 04/04/23 1022  ST 22 25 44* 24  --  29  --   LT 18 19 25 20   --  16  --   LKPHOS 91 81 81 76  --  69  --   BILITOT 0.6 0.4 0.4 0.3  --  0.4  --   PROT 6.9 5.7* 6.0* 5.6*  --  5.6* 5.6*  LBUMIN 3.2* 2.5* 2.5* 2.3* 2.4* 2.3*  --     CBG: Recent Lbs  Lb 04/03/23 2032 04/04/23 0018 04/04/23 0538 04/04/23 0753 04/04/23 1152  GLUCP 174* 174* 184* 216* 191*     Recent Results (from the pst 240 hour(s))  Resp pnel by RT-PCR (RSV, Flu &B, Covid) nterior Nsl Swb     Sttus: None   Collection Time: 03/31/23 12:50 M    Specimen: nterior Nsl Swb  Result Vlue Ref Rnge Sttus   SRS Coronvirus 2 by RT PCR NEGTIVE NEGTIVE Finl   Influenz  by PCR NEGTIVE NEGTIVE Finl   Influenz B by PCR NEGTIVE NEGTIVE Finl    Comment: (NOTE) The Xpert Xpress SRS-CoV-2/FLU/RSV plus ssy is intended s n id in the dignosis of influenz from Nsophryngel swb specimens nd should not be used s  sole bsis for tretment. Nsl wshings nd spirtes re uncceptble for Xpert Xpress SRS-CoV-2/FLU/RSV testing.  Fct Sheet for Ptients: BloggerCourse.com  Fct Sheet for [place] Providers: SeriousBroker.it  This test is not yet pproved or clered by the [facility] nd hs been uthorized for detection nd/or dignosis of SRS-CoV-2 by FD under n Emergency Use uthoriztion (EU). This EU will remin in effect (mening this test cn be used) for the durtion of the COVID-19 declrtion under Section 564(b)(1) [place] the [place], 21 U.S.C. section 360bbb-3(b)(1), unless the uthoriztion is terminted or revoked.     Resp Syncytil Virus by PCR NEGTIVE NEGTIVE Finl    Comment: (NOTE) Fct Sheet for Ptients: BloggerCourse.com  Fct Sheet for [facility] Providers: SeriousBroker.it  This test is not yet pproved or clered by the [facility] nd hs been uthorized for detection nd/or dignosis of SRS-CoV-2 by FD under n Emergency Use uthoriztion (EU). This EU will remin in effect (mening this test cn be used) for the durtion of the COVID-19 declrtion under Section 564(b)(1) of the ct, 21 U.S.C. section 360bbb-3(b)(1), unless the uthoriztion  is terminated or revoked.  Performed at [facility], [address], [place], [place] [address]   Blood Culture (routine x 2)     Status: None (Preliminary result)   Collection Time: 03/31/23 12:50 AM   Specimen:  BLOOD RIGHT ARM  Result Value Ref Range Status   Specimen Description BLOOD RIGHT ARM  Final   Special Requests   Final    BOTTLES DRAWN AEROBIC AND ANAEROBIC Blood Culture adequate volume   Culture   Final    NO GROWTH 4 DAYS Performed at [facility], [address], [place], [place] [address]    Report Status PENDING  Incomplete  Blood Culture (routine x 2)     Status: None (Preliminary result)   Collection Time: 03/31/23  1:19 AM   Specimen: BLOOD RIGHT HAND  Result Value Ref Range Status   Specimen Description BLOOD RIGHT HAND  Final   Special Requests   Final    BOTTLES DRAWN AEROBIC ONLY Blood Culture results may not be optimal due to an inadequate volume of blood received in culture bottles   Culture   Final    NO GROWTH 4 DAYS Performed at [facility], [address] [place], [place] [address]    Report Status PENDING  Incomplete         Radiology Studies: DG CHEST PORT 1 VIEW  Result Date: 04/04/2023 CLINICAL DATA:  77 year old female status post thoracentesis. EXAM: PORTABLE CHEST 1 VIEW COMPARISON:  Chest CT 04/01/2023. FINDINGS: Portable AP upright view at 1106 hours. Large caliber and/or dual lumen right side chest vascular catheter is stable. Mildly lower lung volumes compared to 03/31/2023 radiograph. Recently the right side pleural effusion demonstrated by CT was radiographically occult. No pneumothorax. Stable ventilation since 03/31/2023 otherwise. Stable cardiac size and mediastinal contours. Visualized tracheal air column is within normal limits. No acute osseous abnormality identified. Paucity of bowel gas in the upper abdomen. IMPRESSION: No pneumothorax or new cardiopulmonary abnormality following thoracentesis. Electronically Signed   By: Odessa Fleming M.D.   On: 04/04/2023 13:01        Scheduled Meds:  bisacodyl  5 mg Oral BID   carvedilol  3.125 mg Oral BID WC   Chlorhexidine Gluconate Cloth  6 each Topical Q0600   [START ON 04/05/2023]  darbepoetin (ARANESP) injection - DIALYSIS  40 mcg Subcutaneous Q Mon-1800   feeding supplement  237 mL Oral Q24H   feeding supplement (PROSource TF20)  60 mL Per Tube Daily   free water  40 mL Per Tube Q2H   insulin aspart  0-6 Units Subcutaneous Q4H   insulin glargine-yfgn  10 Units Subcutaneous QHS   magnesium oxide  400 mg Oral Daily   polyethylene glycol  17 g Per Tube BID   sodium chloride flush  10-40 mL Intracatheter Q12H   Continuous Infusions:  cefTRIAXone (ROCEPHIN)  IV 1 g (04/04/23 0200)   feeding supplement (NEPRO CARB STEADY) Stopped (04/04/23 1234)   sodium phosphate 15 mmol in dextrose 5 % 250 mL infusion 15 mmol (04/04/23 1220)     LOS: 4 days    Time spent: oer 30 min    Lacretia Nicks, MD [facility]   To contact the attending provider between 7A-7P or the covering provider during after hours 7P-7A, please log into the web site [email or web address] and access using universal [place] password for that web site. If you do not have the password, please call the hospital operator.  04/04/2023,  2:55 PM

## 2023-04-04 NOTE — Progress Notes (Signed)
[facility] Progress Note   Subjective:   Had HD yesterday with UF, no issues reported. She reports she is tired today. Reports she has no appetite, utilizing TF. Denies SOB, CP, dizziness. Had bedside thoracentesis performed today with fluid removed.   Objective Vitals:   04/03/23 2029 04/04/23 0500 04/04/23 0536 04/04/23 0825  BP: (!) 137/56  (!) 141/65 137/63  Pulse: 85  86 86  Resp: 18  17 20   Temp: 98.4 F (36.9 C)  98.3 F (36.8 C) 98.4 F (36.9 C)  TempSrc:    Oral  SpO2: 100%  97% 96%  Weight:  61 kg     Physical Exam General: Sleeping, awakens to voice and answers questions appropriately, NAD Heart: RRR, no murmurs, rubs or gallops Lungs: CTA bilaterally Abdomen: Soft, non-distended, +BS Extremities: No edema b/l lower extremities Dialysis Access:  R [facility]  Additional Objective Labs: Basic Metabolic Panel: Recent Labs  Lab 04/03/23 1321 04/03/23 1843 04/03/23 1845 04/04/23 0237  NA 134* 135  --  132*  K 3.3* 3.3*  --  3.5  CL 97* 94*  --  96*  CO2 24 28  --  27  GLUCOSE 284* 181*  --  174*  BUN 43* 15  --  20  CREATININE 2.90* 1.43*  --  1.72*  CALCIUM 8.5* 8.4*  --  8.3*  PHOS 3.0 1.5* 1.4* 1.7*   Liver Function Tests: Recent Labs  Lab 04/02/23 0605 04/03/23 1321 04/03/23 1843 04/04/23 0237  AST 44* 24  --  29  ALT 25 20  --  16  ALKPHOS 81 76  --  69  BILITOT 0.4 0.3  --  0.4  PROT 6.0* 5.6*  --  5.6*  ALBUMIN 2.5* 2.3* 2.4* 2.3*   No results for input(s): "LIPASE", "AMYLASE" in the last 168 hours. CBC: Recent Labs  Lab 03/31/23 0050 03/31/23 0915 04/01/23 0354 04/02/23 0605 04/03/23 1321 04/03/23 1843 04/04/23 0237  WBC 14.7*   < > 10.2 6.0 7.1 7.4 7.5  NEUTROABS 10.3*  --   --   --  4.6  --  4.5  HGB 11.5*   < > 9.8* 10.5* 9.1* 9.7* 9.2*  HCT 36.5   < > 30.6* 32.8* 28.6* 30.5* 29.0*  MCV 99.7   < > 97.8 100.0 99.0 96.8 100.0  PLT 159   < > 131* 138* 151 158 148*   < > = values in this interval not  displayed.   Blood Culture    Component Value Date/Time   SDES BLOOD RIGHT HAND 03/31/2023 0119   SPECREQUEST  03/31/2023 0119    BOTTLES DRAWN AEROBIC ONLY Blood Culture results may not be optimal due to an inadequate volume of blood received in culture bottles   CULT  03/31/2023 0119    NO GROWTH 3 DAYS Performed at [facility], [address], [place], [place] [address]    REPTSTATUS PENDING 03/31/2023 0119    Cardiac Enzymes: No results for input(s): "CKTOTAL", "CKMB", "CKMBINDEX", "TROPONINI" in the last 168 hours. CBG: Recent Labs  Lab 04/03/23 1154 04/03/23 2032 04/04/23 0018 04/04/23 0538 04/04/23 0753  GLUCAP 264* 174* 174* 184* 216*   Iron Studies: No results for input(s): "IRON", "TIBC", "TRANSFERRIN", "FERRITIN" in the last 72 hours. @lablastinr3 @ Studies/Results: No results found. Medications:  cefTRIAXone (ROCEPHIN)  IV 1 g (04/04/23 0200)   feeding supplement (NEPRO CARB STEADY) 1,000 mL (04/03/23 1808)    bisacodyl  5 mg Oral BID  carvedilol  3.125 mg Oral BID WC   Chlorhexidine Gluconate Cloth  6 each Topical Q0600   feeding supplement  237 mL Oral Q24H   feeding supplement (PROSource TF20)  60 mL Per Tube Daily   free water  40 mL Per Tube Q2H   insulin aspart  0-6 Units Subcutaneous Q4H   insulin glargine-yfgn  10 Units Subcutaneous QHS   magnesium oxide  400 mg Oral Daily   polyethylene glycol  17 g Per Tube BID   sodium chloride flush  10-40 mL Intracatheter Q12H    OP Dialysis Orders: MWF SW  3h   350/600   59kg   2/2 bath  TDC   Heparin 2500+ 1500 midrun - last OP HD 8/05, post wt 56.2kg   - no esa, no vdra - last Hb 12.2, pth 179    Assessment/Plan: SIRS/ fever/ ^wbc - unclear source, sig constipation per abd CT, but chole tube appeared to be in proper position. Blood cx's pending on IV abx. Per pmd.  S/p thoracentesis for effusion.  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  Using [facility]. Was off schedule this week, resume MWF schedule.  HTN/ volume - euvolemic on exam, CXR w/o edema, on RA. BP's wnl.  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 She is not on a phos binder H/o CVA - w/ subsequent PEG tube. She is on nepro. RD increased TF yesterday, if still low over the next few days may need to try a different formulation or a daily PO supplement H/o dementia  Rogers Blocker, PA-C 04/04/2023, 8:42 AM  [facility] Pager: [phone]

## 2023-04-04 NOTE — Progress Notes (Signed)
Pleural studies are consistent with transudative effusion. CXR showed no more fluid.   PCCM will sign off, please call with questions     Cheri Fowler, MD  Pulmonary Critical Care See Amion for pager If no response to pager, please call [phone] until 7pm After 7pm, Please call E-link [phone]

## 2023-04-05 ENCOUNTER — Telehealth: Payer: Self-pay | Admitting: Internal Medicine

## 2023-04-05 DIAGNOSIS — A419 Sepsis, unspecified organism: Secondary | ICD-10-CM | POA: Diagnosis not present

## 2023-04-05 LAB — RENAL FUNCTION PANEL
Albumin: 2.2 g/dL — ABNORMAL LOW (ref 3.5–5.0)
Anion gap: 12 (ref 5–15)
BUN: 32 mg/dL — ABNORMAL HIGH (ref 8–23)
CO2: 26 mmol/L (ref 22–32)
Calcium: 8.4 mg/dL — ABNORMAL LOW (ref 8.9–10.3)
Chloride: 95 mmol/L — ABNORMAL LOW (ref 98–111)
Creatinine, Ser: 2.48 mg/dL — ABNORMAL HIGH (ref 0.44–1.00)
GFR, Estimated: 20 mL/min — ABNORMAL LOW (ref >60)
Glucose, Bld: 192 mg/dL — ABNORMAL HIGH (ref 70–99)
Phosphorus: 3.8 mg/dL (ref 2.5–4.6)
Potassium: 3.2 mmol/L — ABNORMAL LOW (ref 3.5–5.1)
Sodium: 133 mmol/L — ABNORMAL LOW (ref 135–145)

## 2023-04-05 LAB — CBC
HCT: 30.7 % — ABNORMAL LOW (ref 36.0–46.0)
Hemoglobin: 9.8 g/dL — ABNORMAL LOW (ref 12.0–15.0)
MCH: 32 pg (ref 26.0–34.0)
MCHC: 31.9 g/dL (ref 30.0–36.0)
MCV: 100.3 fL — ABNORMAL HIGH (ref 80.0–100.0)
Platelets: 175 10*3/uL (ref 150–400)
RBC: 3.06 MIL/uL — ABNORMAL LOW (ref 3.87–5.11)
RDW: 14.2 % (ref 11.5–15.5)
WBC: 7.9 10*3/uL (ref 4.0–10.5)
nRBC: 0 % (ref 0.0–0.2)

## 2023-04-05 LAB — GLUCOSE, CAPILLARY
Glucose-Capillary: 125 mg/dL — ABNORMAL HIGH (ref 70–99)
Glucose-Capillary: 165 mg/dL — ABNORMAL HIGH (ref 70–99)
Glucose-Capillary: 178 mg/dL — ABNORMAL HIGH (ref 70–99)
Glucose-Capillary: 130 mg/dL — ABNORMAL HIGH (ref 70–99)

## 2023-04-05 MED ORDER — ANTICOAGULANT SODIUM CITRATE 4% (200MG/5ML) IV SOLN: 5.0000 mL | Status: DC | PRN

## 2023-04-05 MED ORDER — PENTAFLUOROPROP-TETRAFLUOROETH EX AERO: 1.0000 | INHALATION_SPRAY | CUTANEOUS | Status: DC | PRN

## 2023-04-05 MED ORDER — HEPARIN SODIUM (PORCINE) 1000 UNIT/ML IJ SOLN
INTRAMUSCULAR | Status: AC
  Filled 2023-04-05: qty 5

## 2023-04-05 MED ORDER — LIDOCAINE-PRILOCAINE 2.5-2.5 % EX CREA: 1.0000 | TOPICAL_CREAM | CUTANEOUS | Status: DC | PRN

## 2023-04-05 MED ORDER — NEPRO/CARBSTEADY PO LIQD
1000.0000 mL | ORAL | Status: DC
  Administered 2023-04-05: 1000 mL
  Filled 2023-04-05: qty 1000

## 2023-04-05 MED ORDER — HEPARIN SODIUM (PORCINE) 1000 UNIT/ML DIALYSIS
1000.0000 [IU] | INTRAMUSCULAR | Status: DC | PRN
  Administered 2023-04-05: 4500 [IU]
  Filled 2023-04-05: qty 1

## 2023-04-05 MED ORDER — LIDOCAINE HCL (PF) 1 % IJ SOLN: 5.0000 mL | INTRAMUSCULAR | Status: DC | PRN

## 2023-04-05 MED ORDER — POTASSIUM CHLORIDE 20 MEQ PO PACK
40.0000 meq | PACK | Freq: Once | ORAL | Status: AC
  Administered 2023-04-05: 40 meq via ORAL
  Filled 2023-04-05: qty 2

## 2023-04-05 MED ORDER — ALTEPLASE 2 MG IJ SOLR: 2.0000 mg | Freq: Once | INTRAMUSCULAR | Status: DC | PRN

## 2023-04-05 NOTE — Progress Notes (Signed)
**Note De-Identified vi Obfusction** PROGRESS NOTE    [name]  JYN:[id number] DOB: [birth year] DOA: 03/30/2023 PCP: Slvtore Decent, FNP  Chief Complint  [name] presents with   Abdominl Pin   Vomiting    Brief Nrrtive:   Samantha [name] is  77 y.o. femle with medicl history significnt of cute cholecystitis with cholelithisis sttus post lproscopic cholecystostomy plcement in April 2024, systolic CHF EF 40%, ESRD on HD, CVA with left-sided hemipresis, dysphgi with PEG tube feeding, insulin-dependent DM2 comes to the hospitl with fever.   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 Slight incresed strndy density nd fluid djcent to cholecystostomy tube?) - s/p thor - will follow thor lbs - trnsudte - no growth <12 hr - UA with negtive nitrite, + LE, RBC's ---> no urine culture ordered - blood cultures Ngx5 dys - negtive covid, influenz, RSV - ceftrixone lone nd follow closely, low threshold to restrt - will pln for 7-10 dy course of bx   Fecl Impction  Constiption - ggressive bowel regimen    History of clculous colitis - Underwent cholecystostomy tube plcement in April 2024, follows outptient IR.  This hs been dislodged in between nd exchnged.  Currently output looks oky, we will reconsult IR to ensure proper functioning of this.  Dringe t this time  does not pper to be infected. 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  Will reduce home long-cting from 16 units to 10 units nd SSI.  This cn slowly be djusted while ptient on tube feeds.    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  Continue Coreg.  IV s needed   History of DVT - IVC filter in plce.  On Eliquis which we will continue    DVT prophylxis: eliquis Code Sttus: full Fmily Communiction: none - clled dughter jocelyn, no nswer Disposition:   Sttus is: Inptient Remins inptient pproprite becuse: need for continued inptient cre   Consultnts:  renl  Procedures:  none  Antimicrobils:  Anti-infectives (From dmission, onwrd)    Strt     Dose/Rte Route Frequency Ordered Stop   04/08/23 1200  vncomycin (VANCOREADY) IVPB 500 mg/100 mL  Sttus:  Discontinued  500 mg 100 mL/hr over 60 Minutes Intravenous Every Thu (Hemodialysis) 04/01/23 1336 04/01/23 1416   04/05/23 1200  vancomycin (VANCOREADY) IVPB 500 mg/100 mL  Status:  Discontinued        500 mg 100 mL/hr over 60 Minutes Intravenous Every M-W-F (Hemodialysis) 04/02/23 1120 04/03/23 1718   04/03/23 1200  vancomycin (VANCOREADY) IVPB 500 mg/100 mL        500 mg 100 mL/hr over  60 Minutes Intravenous Every Sat (Hemodialysis) 04/03/23 1000 04/03/23 1612   04/02/23 1200  vancomycin (VANCOREADY) IVPB 500 mg/100 mL  Status:  Discontinued        500 mg 100 mL/hr over 60 Minutes Intravenous Every M-W-F (Hemodialysis) 03/31/23 0929 04/02/23 1120   04/02/23 1122  vancomycin variable dose per unstable renal function (pharmacist dosing)  Status:  Discontinued         Does not apply See admin instructions 04/02/23 1123 04/03/23 1209   04/01/23 1800  vancomycin (VANCOREADY) IVPB 500 mg/100 mL        500 mg 100 mL/hr over 60 Minutes Intravenous Every Thu (Hemodialysis) 04/01/23 1416 04/01/23 1721   04/01/23 0200  cefTRIAXone (ROCEPHIN) 1 g in sodium chloride 0.9 % 100 mL IVPB        1 g 200 mL/hr over 30 Minutes Intravenous Every 24 hours 03/31/23 0919     03/31/23 1400  metroNIDAZOLE (FLAGYL) IVPB 500 mg  Status:  Discontinued        500 mg 100 mL/hr over 60 Minutes Intravenous Every 12 hours 03/31/23 0919 04/03/23 1718   03/31/23 0930  vancomycin (VANCOREADY) IVPB 1250 mg/250 mL        1,250 mg 166.7 mL/hr over 90 Minutes Intravenous  Once 03/31/23 0929 03/31/23 1134   03/31/23 0030  cefTRIAXone (ROCEPHIN) 2 g in sodium chloride 0.9 % 100 mL IVPB        2 g 200 mL/hr over 30 Minutes Intravenous  Once 03/31/23 0016 03/31/23 0221   03/31/23 0030  metroNIDAZOLE (FLAGYL) IVPB 500 mg        500 mg 100 mL/hr over 60 Minutes Intravenous  Once 03/31/23 0016 03/31/23 0334       Subjective: Sleepy, no complaints  Objective: Vitals:   04/04/23 1937 04/05/23 0441 04/05/23 0500 04/05/23 0841  BP: (!) 137/53 (!) 134/58  (!) 132/52  Pulse: 90 95  77  Resp: 18 18  17   Temp: 99.2 F (37.3 C) 98.6 F (37 C)  (!) 97.5 F (36.4 C)  TempSrc:      SpO2: 100% 97%  98%  Weight:   61.4 kg     Intake/Output Summary (Last 24 hours) at 04/05/2023 1000 Last data filed at 04/04/2023 2103 Gross per 24 hour  Intake 10 ml  Output 0 ml  Net 10 ml   Filed Weights   04/03/23 1638  04/04/23 0500 04/05/23 0500  Weight: 61.6 kg 61 kg 61.4 kg    Examination:  General: No acute distress. sleepy Cardiovascular: RRR Lungs: unlabored Abdomen: Soft, nontender, nondistended  Neurological: Alert and oriented 3. Moves all extremities 4 with equal strength. Cranial nerves II through XII grossly intact. Extremities: No clubbing or cyanosis. No edema.   Data Reviewed: I have personally reviewed following labs and imaging studies  CBC: Recent Labs  Lab 03/31/23 0050 03/31/23 0915 04/03/23 1321 04/03/23 1843 04/04/23 0237 04/05/23 0118 04/05/23 0724  WBC 14.7*   < > 7.1 7.4 7.5 8.3 7.9  NEUTROABS 10.3*  --  4.6  -- **Note De-Identified vi Obfusction** 4.5  --   --   HGB 11.5*   < > 9.1* 9.7* 9.2* 9.6* 9.8*  HCT 36.5   < > 28.6* 30.5* 29.0* 29.3* 30.7*  MCV 99.7   < > 99.0 96.8 100.0 98.3 100.3*  PLT 159   < > 151 158 148* 170 175   < > = vlues in this intervl not displyed.    Bsic Metbolic Pnel: Recent Lbs  Lb 04/02/23 1737 04/03/23 1321 04/03/23 1843 04/03/23 1845 04/04/23 0237 04/05/23 0118 04/05/23 0724  N  --  134* 135  --  132* 134* 133*  K  --  3.3* 3.3*  --  3.5 3.1* 3.2*  CL  --  97* 94*  --  96* 95* 95*  CO2  --  24 28  --  27 28 26   GLUCOSE  --  284* 181*  --  174* 135* 192*  BUN  --  43* 15  --  20 29* 32*  CRETININE  --  2.90* 1.43*  --  1.72* 2.30* 2.48*  CLCIUM  --  8.5* 8.4*  --  8.3* 8.4* 8.4*  MG 1.9 2.0  --  1.5* 1.6* 1.6*  --   PHOS 1.7* 3.0 1.5* 1.4* 1.7* 3.8 3.8    GFR: Estimted Cretinine Clernce: 15.4 mL/min () (by [place] formul bsed on SCr of 2.48 mg/dL (H)).  Liver Function Tests: Recent Lbs  Lb 03/31/23 0050 04/01/23 0354 04/02/23 0605 04/03/23 1321 04/03/23 1843 04/04/23 0237 04/04/23 1022 04/05/23 0724  ST 22 25 44* 24  --  29  --   --   LT 18 19 25 20   --  16  --   --   LKPHOS 91 81 81 76  --  69  --   --   BILITOT 0.6 0.4 0.4 0.3  --  0.4  --   --   PROT 6.9 5.7* 6.0* 5.6*  --  5.6* 5.6*  --   LBUMIN 3.2* 2.5* 2.5* 2.3*  2.4* 2.3*  --  2.2*    CBG: Recent Lbs  Lb 04/04/23 1743 04/04/23 2112 04/05/23 0106 04/05/23 0441 04/05/23 0735  GLUCP 158* 115* 125* 165* 178*     Recent Results (from the pst 240 hour(s))  Resp pnel by RT-PCR (RSV, Flu &B, Covid) nterior Nsl Swb     Sttus: None   Collection Time: 03/31/23 12:50 M   Specimen: nterior Nsl Swb  Result Vlue Ref Rnge Sttus   SRS Coronvirus 2 by RT PCR NEGTIVE NEGTIVE Finl   Influenz  by PCR NEGTIVE NEGTIVE Finl   Influenz B by PCR NEGTIVE NEGTIVE Finl    Comment: (NOTE) The Xpert Xpress SRS-CoV-2/FLU/RSV plus ssy is intended s n id in the dignosis of influenz from Nsophryngel swb specimens nd should not be used s  sole bsis for tretment. Nsl wshings nd spirtes re uncceptble for Xpert Xpress SRS-CoV-2/FLU/RSV testing.  Fct Sheet for Ptients: BloggerCourse.com  Fct Sheet for Helthcre Providers: SeriousBroker.it  This test is not yet pproved or clered by the [place] [facility] nd hs been uthorized for detection nd/or dignosis of SRS-CoV-2 by FD under n Emergency Use uthoriztion (EU). This EU will remin in effect (mening this test cn be used) for the durtion of the COVID-19 declrtion under Section 564(b)(1) of the ct, 21 U.S.C. section 360bbb-3(b)(1), unless the uthoriztion is terminted or revoked.     Resp Syncytil Virus by PCR NEGTIVE NEGTIVE Finl    Comment: (NOTE) Fct Sheet  for Patients: BloggerCourse.com  Fact Sheet for Healthcare Providers: SeriousBroker.it  This test is not yet approved or cleared by the [place] [facility] and has been authorized for detection and/or diagnosis of SRS-CoV-2 by FD under an Emergency Use uthorization (EU). This EU will remain in effect (meaning this test can be used) for the duration of the COVID-19  declaration under Section 564(b)(1) of the ct, 21 U.S.C. section 360bbb-3(b)(1), unless the authorization is terminated or revoked.  Performed at [facility], [address], [place], [place] [address]   Blood Culture (routine x 2)     Status: None   Collection Time: 03/31/23 12:50 M   Specimen: BLOOD RIGHT RM  Result Value Ref Range Status   Specimen Description BLOOD RIGHT RM  Final   Special Requests   Final    BOTTLES DRWN EROBIC ND NEROBIC Blood Culture adequate volume   Culture   Final    NO GROWTH 5 DYS Performed at [facility], [address], [place], [place] [address]    Report Status 04/05/2023 FINL  Final  Blood Culture (routine x 2)     Status: None   Collection Time: 03/31/23  1:19 M   Specimen: BLOOD RIGHT HND  Result Value Ref Range Status   Specimen Description BLOOD RIGHT HND  Final   Special Requests   Final    BOTTLES DRWN EROBIC ONLY Blood Culture results may not be optimal due to an inadequate volume of blood received in culture bottles   Culture   Final    NO GROWTH 5 DYS Performed at [facility], [address], [place], [place] [address]    Report Status 04/05/2023 FINL  Final  Body fluid culture w Gram Stain     Status: None (Preliminary result)   Collection Time: 04/04/23 10:10 M   Specimen: Pleural Fluid  Result Value Ref Range Status   Specimen Description PLEURL  Final   Special Requests NONE  Final   Gram Stain RRE WBC SEEN NO ORGNISMS SEEN   Final   Culture   Final    NO GROWTH < 24 HOURS Performed at [facility], [address] [place], [place] [address]    Report Status PENDING  Incomplete         Radiology Studies: DG CHEST PORT 1 VIEW  Result Date: 04/04/2023 CLINICL DT:  77 year old female status post thoracentesis. EXM: PORTBLE CHEST 1 VIEW COMPRISON:  Chest CT 04/01/2023. FINDINGS: Portable P upright view at 1106 hours. Large caliber and/or dual lumen right side chest  vascular catheter is stable. Mildly lower lung volumes compared to 03/31/2023 radiograph. Recently the right side pleural effusion demonstrated by CT was radiographically occult. No pneumothorax. Stable ventilation since 03/31/2023 otherwise. Stable cardiac size and mediastinal contours. Visualized tracheal air column is within normal limits. No acute osseous abnormality identified. Paucity of bowel gas in the upper abdomen. IMPRESSION: No pneumothorax or new cardiopulmonary abnormality following thoracentesis. Electronically Signed   By: Odessa Fleming M.D.   On: 04/04/2023 13:01        Scheduled Meds:  apixaban  5 mg Per Tube BID   bisacodyl  5 mg Oral BID   carvedilol  3.125 mg Oral BID WC   Chlorhexidine Gluconate Cloth  6 each Topical Q0600   darbepoetin (RNESP) injection - DILYSIS  40 mcg Subcutaneous Q Mon-1800   feeding supplement  237 mL Oral Q24H   feeding supplement (PROSource TF20)  60  mL Per Tube Daily   free water  40 mL Per Tube Q2H   insulin aspart  0-6 Units Subcutaneous Q4H   insulin glargine-yfgn  10 Units Subcutaneous QHS   magnesium oxide  400 mg Oral Daily   polyethylene glycol  17 g Per Tube BID   sodium chloride flush  10-40 mL Intracatheter Q12H   Continuous Infusions:  anticoagulant sodium citrate     cefTRIAXone (ROCEPHIN)  IV 1 g (04/05/23 0206)   feeding supplement (NEPRO CARB STEADY) 1,000 mL (04/04/23 2239)     LOS: 5 days    Time spent: oer 30 min    Lacretia Nicks, MD [facility]   To contact the attending provider between 7A-7P or the covering provider during after hours 7P-7A, please log into the web site [email or web address] and access using universal [place] password for that web site. If you do not have the password, please call the hospital operator.  04/05/2023, 10:00 AM

## 2023-04-05 NOTE — Progress Notes (Signed)
[facility] Progress Note   Subjective:    Seen and examined patient at bedside. Just completed HD today and tolerated it well. Noted net UF of 1L. No acute complaints. Noted K+ 3.2. Will order PO supplementation.  Objective Vitals:   04/05/23 1334 04/05/23 1359 04/05/23 1411 04/05/23 1435  BP:    (!) 107/56  Pulse: (!) 105 (!) 105  95  Resp: (!) 28 (!) 34  18  Temp:    98.3 F (36.8 C)  TempSrc:      SpO2: 100%   100%  Weight:   59.2 kg    Physical Exam General: Alert, awake, NAD Heart: RRR, no murmurs, rubs or gallops Lungs: Clear anteriorly Abdomen: Soft, non-distended, +BS Extremities: No edema b/l lower extremities Dialysis Access:  R [facility]  Filed Weights   04/05/23 0500 04/05/23 0953 04/05/23 1411  Weight: 61.4 kg 60.2 kg 59.2 kg    Intake/Output Summary (Last 24 hours) at 04/05/2023 1535 Last data filed at 04/05/2023 1300 Gross per 24 hour  Intake 10 ml  Output --  Net 10 ml    Additional Objective Labs: Basic Metabolic Panel: Recent Labs  Lab 04/04/23 0237 04/05/23 0118 04/05/23 0724  NA 132* 134* 133*  K 3.5 3.1* 3.2*  CL 96* 95* 95*  CO2 27 28 26   GLUCOSE 174* 135* 192*  BUN 20 29* 32*  CREATININE 1.72* 2.30* 2.48*  CALCIUM 8.3* 8.4* 8.4*  PHOS 1.7* 3.8 3.8   Liver Function Tests: Recent Labs  Lab 04/02/23 0605 04/03/23 1321 04/03/23 1843 04/04/23 0237 04/04/23 1022 04/05/23 0724  AST 44* 24  --  29  --   --   ALT 25 20  --  16  --   --   ALKPHOS 81 76  --  69  --   --   BILITOT 0.4 0.3  --  0.4  --   --   PROT 6.0* 5.6*  --  5.6* 5.6*  --   ALBUMIN 2.5* 2.3* 2.4* 2.3*  --  2.2*   No results for input(s): "LIPASE", "AMYLASE" in the last 168 hours. CBC: Recent Labs  Lab 03/31/23 0050 03/31/23 0915 04/03/23 1321 04/03/23 1843 04/04/23 0237 04/05/23 0118 04/05/23 0724  WBC 14.7*   < > 7.1 7.4 7.5 8.3 7.9  NEUTROABS 10.3*  --  4.6  --  4.5  --   --   HGB 11.5*   < > 9.1* 9.7* 9.2* 9.6* 9.8*  HCT 36.5   < > 28.6*  30.5* 29.0* 29.3* 30.7*  MCV 99.7   < > 99.0 96.8 100.0 98.3 100.3*  PLT 159   < > 151 158 148* 170 175   < > = values in this interval not displayed.   Blood Culture    Component Value Date/Time   SDES PLEURAL 04/04/2023 1010   SPECREQUEST NONE 04/04/2023 1010   CULT  04/04/2023 1010    NO GROWTH < 24 HOURS Performed at [facility], [address] [place], [place] [address]    REPTSTATUS PENDING 04/04/2023 1010    Cardiac Enzymes: No results for input(s): "CKTOTAL", "CKMB", "CKMBINDEX", "TROPONINI" in the last 168 hours. CBG: Recent Labs  Lab 04/04/23 1743 04/04/23 2112 04/05/23 0106 04/05/23 0441 04/05/23 0735  GLUCAP 158* 115* 125* 165* 178*   Iron Studies: No results for input(s): "IRON", "TIBC", "TRANSFERRIN", "FERRITIN" in the last 72 hours. Lab Results  Component Value Date   INR 2.1 (H) 03/31/2023   INR  1.5 (H) 12/13/2022   Studies/Results: DG CHEST PORT 1 VIEW  Result Date: 04/04/2023 CLINICAL DATA:  77 year old female status post thoracentesis. EXAM: PORTABLE CHEST 1 VIEW COMPARISON:  Chest CT 04/01/2023. FINDINGS: Portable AP upright view at 1106 hours. Large caliber and/or dual lumen right side chest vascular catheter is stable. Mildly lower lung volumes compared to 03/31/2023 radiograph. Recently the right side pleural effusion demonstrated by CT was radiographically occult. No pneumothorax. Stable ventilation since 03/31/2023 otherwise. Stable cardiac size and mediastinal contours. Visualized tracheal air column is within normal limits. No acute osseous abnormality identified. Paucity of bowel gas in the upper abdomen. IMPRESSION: No pneumothorax or new cardiopulmonary abnormality following thoracentesis. Electronically Signed   By: Odessa Fleming M.D.   On: 04/04/2023 13:01    Medications:  cefTRIAXone (ROCEPHIN)  IV 1 g (04/05/23 0206)   feeding supplement (NEPRO CARB STEADY)      apixaban  5 mg Per Tube BID   bisacodyl  5 mg Oral BID   carvedilol  3.125  mg Oral BID WC   Chlorhexidine Gluconate Cloth  6 each Topical Q0600   darbepoetin (ARANESP) injection - DIALYSIS  40 mcg Subcutaneous Q Mon-1800   feeding supplement  237 mL Oral Q24H   feeding supplement (PROSource TF20)  60 mL Per Tube Daily   free water  40 mL Per Tube Q2H   heparin sodium (porcine)       insulin aspart  0-6 Units Subcutaneous Q4H   insulin glargine-yfgn  10 Units Subcutaneous QHS   magnesium oxide  400 mg Oral Daily   polyethylene glycol  17 g Per Tube BID   potassium chloride  40 mEq Oral Once   sodium chloride flush  10-40 mL Intracatheter Q12H    Dialysis Orders: MWF SW  3h   350/600   59kg   2/2 bath  TDC   Heparin 2500+ 1500 midrun - last OP HD 8/05, post wt 56.2kg   - no esa, no vdra - last Hb 12.2, pth 179  Assessment/Plan: SIRS/ fever/ ^wbc - unclear source, sig constipation per abd CT, but chole tube appeared to be in proper position. Blood cx's pending on IV abx. Per pmd.  S/p thoracentesis for effusion.  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 Using [facility]. Now back on MWF schedule. Next HD 8/14. Noted K+ today 3.2-ordered KCL packet X 1 today. HTN/ volume - euvolemic on exam, CXR w/o edema, on RA. BP's wnl.  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 MBD ckd - CCa in range, received IV phos on 8/9 and 8/11. Phos now 3.8. She is not on a phos binder H/o CVA - w/ subsequent PEG tube. She is on nepro. RD increased TF yesterday, if still low over the next few days may need to try a different formulation or a daily PO supplement H/o dementia  Samantha Holmes, NP [facility] 04/05/2023,3:35 PM  LOS: 5 days

## 2023-04-05 NOTE — Progress Notes (Signed)
Received patient in bed.Awake,alert and oriented x 4. Consent verified.  Access used : Right HD catheter that worked well.Dressing on date.  Duration of treatment : 3.5 hours.   Fluid removed : 1 liter.  Hemo comment: Tolerated treatment well.  Hand off to the patient's nurse.

## 2023-04-05 NOTE — Telephone Encounter (Signed)
Form given to PCP for review

## 2023-04-05 NOTE — Progress Notes (Signed)
Nutrition Brief Note  Patient tolerating Nepro @40  ml/hr x 24hrs. Patient's daughter wishes to transition patient's TF's to nocturnal schedule like she does at home. Patient is not eating much per meal documentation.  Patient drinking Ensure daily per [facility] documentation.   Transition continuous TF's to nocturnal (6pm-8am) Nepro @60  ml/hr (840 ml/day)  Prosource TF20 daily  Provides 1592 kcal, 88 g protein, 610 ml 40 ml q2 hrs FWF which provides an additional 480 ml of water.    Labs: CBG 178, Na 133, K+ 3.2, BUN 32, Cr 2.48, Mag 1.6 Meds: dulcolax, rocephin, Ensure Plus BID, insulin, mag-ox, miralax, NS, sodium phosphate   Leodis Rains, RDN, LDN  Clinical Nutrition

## 2023-04-05 NOTE — Telephone Encounter (Signed)
Patient dropped off document  Cap services , to be filled out by provider. Patient requested to send it back via Call Patient to pick up within 5-days. Document is located in providers tray at front office.Please advise at Mobile [phone] (mobile)

## 2023-04-06 DIAGNOSIS — R6510 Systemic inflammatory response syndrome (SIRS) of non-infectious origin without acute organ dysfunction: Secondary | ICD-10-CM

## 2023-04-06 DIAGNOSIS — L8990 Pressure ulcer of unspecified site, unspecified stage: Secondary | ICD-10-CM | POA: Insufficient documentation

## 2023-04-06 HISTORY — DX: Systemic inflammatory response syndrome (sirs) of non-infectious origin without acute organ dysfunction: R65.10

## 2023-04-06 LAB — GLUCOSE, CAPILLARY
Glucose-Capillary: 134 mg/dL — ABNORMAL HIGH (ref 70–99)
Glucose-Capillary: 171 mg/dL — ABNORMAL HIGH (ref 70–99)
Glucose-Capillary: 172 mg/dL — ABNORMAL HIGH (ref 70–99)
Glucose-Capillary: 231 mg/dL — ABNORMAL HIGH (ref 70–99)
Glucose-Capillary: 81 mg/dL (ref 70–99)
Glucose-Capillary: 95 mg/dL (ref 70–99)

## 2023-04-06 MED ORDER — CEFDINIR 300 MG PO CAPS: 300.0000 mg | ORAL_CAPSULE | Freq: Every day | ORAL | 0 refills | Status: AC

## 2023-04-06 MED ORDER — NEPRO/CARBSTEADY PO LIQD
1000.0000 mL | ORAL | Status: AC
  Administered 2023-04-06: 1000 mL
  Filled 2023-04-06: qty 1000

## 2023-04-06 MED ORDER — NEPRO/CARBSTEADY PO LIQD: 500.0000 mL | ORAL | Status: DC

## 2023-04-06 MED ORDER — NEPRO/CARBSTEADY PO LIQD: ORAL | 1 refills | Status: DC

## 2023-04-06 MED ORDER — FREE WATER
100.0000 mL | Freq: Four times a day (QID) | Status: DC
  Administered 2023-04-07 (×2): 100 mL

## 2023-04-06 MED ORDER — POLYETHYLENE GLYCOL 3350 17 G PO PACK: 17.0000 g | PACK | Freq: Every day | ORAL | 0 refills | Status: DC

## 2023-04-06 MED ORDER — NEPRO/CARBSTEADY PO LIQD: 1000.0000 mL | ORAL | Status: DC

## 2023-04-06 MED ORDER — K PHOS MONO-SOD PHOS DI & MONO 155-852-130 MG PO TABS
500.0000 mg | ORAL_TABLET | Freq: Once | ORAL | Status: AC
  Administered 2023-04-06: 500 mg via ORAL
  Filled 2023-04-06: qty 2

## 2023-04-06 MED ORDER — PROSOURCE TF20 ENFIT COMPATIBL EN LIQD: 60.0000 mL | Freq: Every day | ENTERAL | 0 refills | Status: AC

## 2023-04-06 MED ORDER — FREE WATER: 100.0000 mL | Freq: Four times a day (QID) | 0 refills | Status: AC

## 2023-04-06 NOTE — Discharge Summary (Addendum)
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)   Filure to thrive in dult   DM2 (dibetes mellitus, type 2) (HCC)   Essentil hypertension   History of DVT (deep vein thrombosis)   TTP (thrombotic thrombocytopenic purpur) (HCC)   Alzheimer's disese (HCC)   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 femle with medicl history significnt of cute cholecystitis with cholelithisis sttus post lproscopic cholecystostomy plcement in April 2024, systolic CHF EF 40%, ESRD on HD, CVA with left-sided hemipresis, dysphgi with PEG tube feeding, insulin-dependent DM2 comes to the hospitl with fever.   No cler cuse identified fter extensive workup.  Will tret with ntibiotics.  Follow outptient.   Hospitl Course:  Assessment nd Pln:  SIRS - presented with fever nd leukocytosis without  cler source (sepsis ruled out) - lst fever 8/8 PM (since, no fever or leukocytosis) - CT bd/pelvis without cute findings - CT chest with moderte R pleurl effusion, trce L effusion (? Slight incresed strndy density nd fluid djcent to cholecystostomy tube?) -  s/p thor - will follow thor lbs - trnsudte - no growth 48 hrs - UA with negtive nitrite, + LE, RBC's ---> no urine culture ordered - blood cultures Ngx5 dys - negtive covid, influenz, RSV - s/p bx here, will dischrge with dditionl 3 dys bx   Fecl Impction  Constiption - ggressive bowel regimen    History of clculous colitis - Underwent cholecystostomy tube plcement in April 2024, follows outptient IR.  This hs been dislodged in between nd exchnged.  Currently output looks oky, we will reconsult IR to ensure proper functioning of this.  Dringe t this time does not pper to be infected. 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  Continue Coreg.    History of DVT - IVC filter in plce.  On Eliquis which we will continue     Procedures: thorcentesis   Consulttions: Nephrology Pulmonology IR  Dischrge Exm: Vitls:   04/06/23 0436 04/06/23 0859  BP: (!) 109/44 129/60  Pulse: 90 88  Resp:  16  Temp: **Note De-Identified vi Obfusction** 98.6 F (37 C) 98.3 F (36.8 C)  SpO2: 97% 100%   No complints Long discussion with dughter t bedside regrding d/c plnning  Generl: No cute distress. Crdiovsculr: RRR Lungs: unlbored Abdomen: perc chole drin with cler fluid, G tube Neurologicl:  Alert nd oriented 3. Moves ll extremities 4 with equl strength. Crnil nerves II through XII grossly intct. Extremities: No clubbing or cynosis. No edem.   Dischrge Instructions   Dischrge Instructions     Cll MD for:  difficulty brething, hedche or visul disturbnces   Complete by: As directed    Cll MD for:  extreme ftigue   Complete by: As directed    Cll MD for:  hives   Complete by: As directed    Cll MD for:  persistnt dizziness or light-hededness   Complete by: As directed    Cll MD for:  persistnt nuse nd vomiting   Complete by: As directed    Cll MD for:  redness, tenderness, or signs of infection (pin, swelling, redness, odor or green/yellow dischrge round incision site)   Complete by: As directed    Cll MD for:  severe uncontrolled pin   Complete by: As directed    Cll MD for:  temperture >100.4   Complete by: As directed    Diet - low sodium hert helthy   Complete by: As directed    Dischrge instructions   Complete by: As directed    You were seen for fevers.  You've improved on ntibiotics.  We did not find  cler source of your fevers.  Your cholecystostomy tube ws functioning ppropritely.  You hd  fluid collection between the lung nd the chest wll which ws drined tht ws not consistent with n infection.    You should follow up outptient with interventionl rdiology for your cholecystostomy tube.  They cn lso follow up on your feeding tube.  Follow up with generl surgery for your chronic cholecystitis.  Return for new, recurrent, or worsening symptoms.  Plese sk your PCP to request records from this hospitliztion so they know wht ws done nd wht the next steps will be.   Dischrge wound cre:   Complete by: As directed    Offloding, turn ptient frequently.  Follow decubitus ulcer closely outptient.   Increse ctivity slowly   Complete by: As directed       Allergies s of 04/06/2023        Rections   Aspirin Anphylxis   Penicillins Anphylxis   **Tolertes cephlosporins        Mediction List     STOP tking these medictions    escitloprm 5 MG tblet Commonly known s: LEXAPRO       TAKE these medictions    cetminophen 500 MG tblet Commonly known s: TYLENOL Plce 500 mg into feeding tube every 6 (six) hours s needed for mild pin.   pixbn 5 MG Tbs tblet Commonly known s: ELIQUIS Tke 1 tblet (5 mg totl) by mouth 2 (two) times dily. Wht chnged: how to tke this   biscodyl 5 MG EC tblet Commonly known s: DULCOLAX Tke 5 mg by mouth dily s needed for moderte constiption.   crvedilol 3.125 MG tblet Commonly known s: COREG Tke 1 tblet (3.125 mg totl) by mouth 2 (two) times dily with  mel.   cefdinir 300 MG cpsule Commonly known s: OMNICEF Tke 1 cpsule (300 mg totl) by mouth dily for 3 dys.   feeding supplement (OSMOLITE 1.5 **Note De-Identified vi Obfusction** CAL) Liqd Plce 237 mLs into feeding tube 3 (three) times dily with mels. Wht chnged: Another mediction with the sme nme ws dded. Mke sure you understnd how nd when to tke ech.   feeding supplement (NEPRO CARB STEADY) Liqd Allow Nepro @ 50 ml/hr to run from 8pm-6m (10 hrs totl) Wht chnged: You were lredy tking  mediction with the sme nme, nd this prescription ws dded. Mke sure you understnd how nd when to tke ech.   feeding supplement (PROSource TF20) liquid Plce 60 mLs into feeding tube dily. Strt tking on: April 07, 2023   free wter Soln Plce 100 mLs into feeding tube 4 (four) times dily.   insulin sprt 100 UNIT/ML injection Commonly known s: novoLOG 0-9 Units, Subcutneous, 3 times dily with mels, CBG < 70: Implement Hypoglycemi mesures CBG 70 - 120: 0 units CBG 121 - 150: 1 unit CBG 151 - 200: 2 units CBG 201 - 250: 3 units CBG 251 - 300: 5 units CBG 301 - 350: 7 units CBG 351 - 400: 9 units CBG > 400: cll MD   insulin glrgine  100 UNIT/ML injection Commonly known s: LANTUS Inject 0.16 mLs (16 Units totl) into the skin t bedtime.   multivitmin Tbs tblet Tke 1 tblet by mouth t bedtime.   polyethylene glycol 17 g pcket Commonly known s: MirLx Plce 17 g into feeding tube dily.               Dischrge Cre Instructions  (From dmission, onwrd)           Strt     Ordered   04/06/23 0000  Dischrge wound cre:       Comments: Offloding, turn ptient frequently.  Follow decubitus ulcer closely outptient.   04/06/23 1344           Allergies  Allergen Rections   Aspirin Anphylxis   Penicillins Anphylxis    **Tolertes cephlosporins    Follow-up Informtion     [facility] Follow up.   Specilty: [facility] Why: Someone will cll you to schedule resumtion of cre visit. Contct informtion: [address] [place] [address] [phone]         Slvtore Decent, FNP Follow up.   Specilty: Internl Medicine Contct informtion: [address] [place] [address] [phone]         Andri Meuse, MD Follow up on 06/02/2023.   Specilties: Generl Surgery, Colon nd Rectl Surgery Why: 1020m. Plese rrive 30 minutes prior to your ppointment for pperwork. Plese bring  copy of your photo ID nd insurnce crd. Contct informtion: [address] [place] [address] [phone]         [facility] INTERVENTIONAL RADIOLOGY Follow up.   Specilty: Rdiology Contct informtion: [address] [place] [address] [phone]                 The results of significnt dignostics from this hospitliztion (including imging, microbiology, ncillry nd lbortory) re listed below for reference.    Significnt Dignostic Studies: DG CHEST PORT 1 VIEW  Result Dte: 04/04/2023 CLINICAL DATA:  77 yer old femle sttus post  thorcentesis. EXAM: PORTABLE CHEST 1 VIEW COMPARISON:  Chest CT 04/01/2023. FINDINGS: Portble AP upright view t 1106 hours. Lrge cliber nd/or dul lumen right side chest vsculr ctheter is stble. Mildly lower lung volumes compred to 03/31/2023 rdiogrph. Recently the right side pleurl effusion demonstrted **Note De-Identified vi Obfusction** by CT ws rdiogrphiclly occult. No pneumothorx. Stble ventiltion since 03/31/2023 otherwise. Stble crdic size nd medistinl contours. Visulized trchel ir column is within norml limits. No cute osseous bnormlity identified. Pucity of bowel gs in the upper bdomen. IMPRESSION: No pneumothorx or new crdiopulmonry bnormlity following thorcentesis. Electroniclly Signed   By: Odess Fleming M.D.   On: 04/04/2023 13:01   CT CHEST WO CONTRAST  Result Dte: 04/01/2023 CLINICAL DATA:  Respirtory illness, nondignostic xry EXAM: CT CHEST WITHOUT CONTRAST TECHNIQUE: Multidetector CT imging of the chest ws performed following the stndrd protocol without IV contrst. RADIATION DOSE REDUCTION: This exm ws performed ccording to the deprtmentl dose-optimiztion progrm which includes utomted exposure control, djustment of the mA nd/or kV ccording to ptient size nd/or use of itertive reconstruction technique. COMPARISON:  Chest rdiogrph yesterdy. FINDINGS: Crdiovsculr: Right-sided dilysis ctheter tip in the right trium. Hert is upper norml in size. There re coronry rtery clcifictions. Mild therosclerosis of the thorcic ort. No ortic neurysm. Medistinum/Nodes: Scttered medistinl lymph nodes re not enlrged by size criteri. Limited hilr ssessment in the bsence of IV contrst. No esophgel wll thickening. Lungs/Pleur: Moderte-sized right pleurl effusion mesures simple fluid density. Associted compressive telectsis. There is  trce left pleurl effusion. Mild volume loss in the medil left lower lobe, no centrlly obstructing lesion. No  fetures of pulmonry edem. No suspicious pulmonry nodule or pulmonry mss. Upper Abdomen: Assessed on yesterdy's CT. Cholecystostomy tube in the gllbldder. There my be slight incresed strndy density nd fluid djcent to the cholecystostomy tube, series 3, imge 118. Musculoskeletl: Thorcic spondylosis with spurring. There re no cute or suspicious osseous bnormlities. IMPRESSION: 1. Moderte-sized right pleurl effusion mesures simple fluid density. Associted compressive telectsis. Trce left pleurl effusion. 2. Cholecystostomy tube in the gllbldder. There my be slight incresed strndy density nd fluid djcent to the cholecystostomy tube compred yesterdy's CT. Aortic Atherosclerosis (ICD10-I70.0). Electroniclly Signed   By: Nrd Rutherford M.D.   On: 04/01/2023 21:42   CT ABDOMEN PELVIS WO CONTRAST  Result Dte: 03/31/2023 CLINICAL DATA:  Bowel obstruction suspected Sepsis Abdominl pin, cute, nonloclized. Vomiting. EXAM: CT ABDOMEN AND PELVIS WITHOUT CONTRAST TECHNIQUE: Multidetector CT imging of the bdomen nd pelvis ws performed following the stndrd protocol without IV contrst. RADIATION DOSE REDUCTION: This exm ws performed ccording to the deprtmentl dose-optimiztion progrm which includes utomted exposure control, djustment of the mA nd/or kV ccording to ptient size nd/or use of itertive reconstruction technique. COMPARISON:  02/20/2023 FINDINGS: Lower chest: Smll right pleurl effusion. Compressive telectsis in the right lower lobe. Dilysis ctheter in the right hert. Scttered coronry rtery clcifictions. Heptobiliry: Cholecystostomy tube remins in the gllbldder which is decompressed. Prtilly clcified 9 mm gllstone gin noted. No biliry ductl dilttion. Pncres: No focl bnormlity or ductl dilttion. Spleen: No focl bnormlity.  Norml size. Adrenls/Urinry Trct: Adrenl glnds norml. Stble cyst in the upper pole of the  right kidney for which no dditionl imging is recommended. No stones or hydronephrosis. Urinry bldder unremrkble. Stomch/Bowel: Lrge stool burden in the rectum concerning for fecl impction. This is similr to prior study. Appendix is norml. Gstrostomy tube is within the stomch. No evidence of bowel obstruction. Vsculr/Lymphtic: No evidence of neurysm or denopthy. IVC filter remins in stble position in the infrrenl IVC. Reproductive: Prior hysterectomy.  No dnexl msses. Other: No free fluid or free ir. Musculoskeletl: No cute bony bnormlity. IMPRESSION: No cute findings in the bdomen or pelvis. Stble lrge stool burden in the rectum concerning for fecl impction. Cholelithisis. Stble **Note De-Identified vi Obfusction** cholecystostomy tube position within the decompressed gllbldder. Smll right pleurl effusion with compressive telectsis in the right lower lobe. Coronry rtery disese. Electroniclly Signed   By: Chrlett Nose M.D.   On: 03/31/2023 01:43   DG Chest Port 1 View  Result Dte: 03/31/2023 CLINICL DT:  Sepsis EXM: PORTBLE CHEST 1 VIEW COMPRISON:  12/12/2022 FINDINGS: Stble elevtion of the right hemidiphrgm. Miniml bibsilr telectsis. Lungs re otherwise cler. No pneumothorx or pleurl effusion. Right upper extremity HERO grft tip is gin seen within the deep right trium in the expected loction of the tricuspid vlve plne. Crdic size is t the upper limits of norml. Pulmonry vsculrity is norml. No cute bone bnormlity. IMPRESSION: 1. Miniml bibsilr telectsis. Electroniclly Signed   By: Helyn Numbers M.D.   On: 03/31/2023 00:29   DG BDOMEN PEG TUBE LOCTION  Result Dte: 03/17/2023 CLINICL DT:  Percutneous gstrostomy. EXM: BDOMEN - 1 VIEW COMPRISON:  bdominl rdiogrph dted 03/15/2023. FINDINGS: Percutneous gstrostomy with blloon in the body of the stomch. Contrst injected through the gstrostomy opcifies the stomch nd proximl smll  bowel. There is  smll of contrst to the left of the stomch which is indeterminte in loction nd my be extrluminl or within  loop of bowel. Right upper qudrnt percutneous cholecystostomy noted. IVC filter is seen. IMPRESSION: Percutneous gstrostomy with blloon in the body of the stomch. Electroniclly Signed   By: Elgie Collrd M.D.   On: 03/17/2023 02:09   DG BDOMEN PEG TUBE LOCTION  Result Dte: 03/15/2023 CLINICL DT:  djustment/replcement of PEG tube EXM: BDOMEN - 1 VIEW COMPRISON:  02/20/2023 FINDINGS: Gstrostomy tube noted within the stomch. Contrst mteril seen within the stomch nd proximl duodenum. No contrst extrvstion. Cholecystostomy tube in plce. Nonobstructive bowel gs pttern. IMPRESSION: Gstrostomy tube ppers to be within the stomch. No contrst extrvstion. Electroniclly Signed   By: Chrlett Nose M.D.   On: 03/15/2023 22:15    Microbiology: Recent Results (from the pst 240 hour(s))  Resp pnel by RT-PCR (RSV, Flu &B, Covid) nterior Nsl Swb     Sttus: None   Collection Time: 03/31/23 12:50 M   Specimen: nterior Nsl Swb  Result Vlue Ref Rnge Sttus   SRS Coronvirus 2 by RT PCR NEGTIVE NEGTIVE Finl   Influenz  by PCR NEGTIVE NEGTIVE Finl   Influenz B by PCR NEGTIVE NEGTIVE Finl    Comment: (NOTE) The Xpert Xpress SRS-CoV-2/FLU/RSV plus ssy is intended s n id in the dignosis of influenz from Nsophryngel swb specimens nd should not be used s  sole bsis for tretment. Nsl wshings nd spirtes re uncceptble for Xpert Xpress SRS-CoV-2/FLU/RSV testing.  Fct Sheet for Ptients: BloggerCourse.com  Fct Sheet for [facility] Providers: SeriousBroker.it  This test is not yet pproved or clered by the [facility] nd hs been uthorized for detection nd/or dignosis of SRS-CoV-2 by FD under n Emergency Use uthoriztion (EU).  This EU will remin in effect (mening this test cn be used) for the durtion of the COVID-19 declrtion under Section 564(b)(1) of the ct, 21 U.S.C. section 360bbb-3(b)(1), unless the uthoriztion is terminted or revoked.     Resp Syncytil Virus by PCR NEGTIVE NEGTIVE Finl    Comment: (NOTE) Fct Sheet for Ptients: BloggerCourse.com  Fct Sheet for [facility] Providers: SeriousBroker.it  This test is not yet pproved or clered by the [facility] nd hs been uthorized for detection nd/or dignosis of SRS-CoV-2 by FD under n Emergency Use uthoriztion (EU). This EU will remin  in effect (meaning this test can be used) for the duration of the COVID-19 declaration under Section 564(b)(1) of the Act, 21 U.S.C. section 360bbb-3(b)(1), unless the authorization is terminated or revoked.  Performed at [facility], [address], [place], [place] [address]   Blood Culture (routine x 2)     Status: None   Collection Time: 03/31/23 12:50 AM   Specimen: BLOOD RIGHT ARM  Result Value Ref Range Status   Specimen Description BLOOD RIGHT ARM  Final   Special Requests   Final    BOTTLES DRAWN AEROBIC AND ANAEROBIC Blood Culture adequate volume   Culture   Final    NO GROWTH 5 DAYS Performed at [facility], [address] [place], [place] [address]    Report Status 04/05/2023 FINAL  Final  Blood Culture (routine x 2)     Status: None   Collection Time: 03/31/23  1:19 AM   Specimen: BLOOD RIGHT HAND  Result Value Ref Range Status   Specimen Description BLOOD RIGHT HAND  Final   Special Requests   Final    BOTTLES DRAWN AEROBIC ONLY Blood Culture results may not be optimal due to an inadequate volume of blood received in culture bottles   Culture   Final    NO GROWTH 5 DAYS Performed at [facility], [address], [place], [place] [address]    Report Status 04/05/2023 FINAL  Final  Body  fluid culture w Gram Stain     Status: None (Preliminary result)   Collection Time: 04/04/23 10:10 AM   Specimen: Pleural Fluid  Result Value Ref Range Status   Specimen Description PLEURAL  Final   Special Requests NONE  Final   Gram Stain RARE WBC SEEN NO ORGANISMS SEEN   Final   Culture   Final    NO GROWTH 2 DAYS Performed at [facility], [address] [place], [place] [address]    Report Status PENDING  Incomplete     Labs: Basic Metabolic Panel: Recent Labs  Lab 04/03/23 1321 04/03/23 1843 04/03/23 1845 04/04/23 0237 04/05/23 0118 04/05/23 0724 04/06/23 0311  NA 134* 135  --  132* 134* 133* 134*  K 3.3* 3.3*  --  3.5 3.1* 3.2* 3.3*  CL 97* 94*  --  96* 95* 95* 99  CO2 24 28  --  27 28 26 29   GLUCOSE 284* 181*  --  174* 135* 192* 163*  BUN 43* 15  --  20 29* 32* 14  CREATININE 2.90* 1.43*  --  1.72* 2.30* 2.48* 1.79*  CALCIUM 8.5* 8.4*  --  8.3* 8.4* 8.4* 8.4*  MG 2.0  --  1.5* 1.6* 1.6*  --  1.6*  PHOS 3.0 1.5* 1.4* 1.7* 3.8 3.8 1.9*   Liver Function Tests: Recent Labs  Lab 04/01/23 0354 04/02/23 0605 04/03/23 1321 04/03/23 1843 04/04/23 0237 04/04/23 1022 04/05/23 0724 04/06/23 0311  AST 25 44* 24  --  29  --   --  28  ALT 19 25 20   --  16  --   --  20  ALKPHOS 81 81 76  --  69  --   --  68  BILITOT 0.4 0.4 0.3  --  0.4  --   --  0.2*  PROT 5.7* 6.0* 5.6*  --  5.6* 5.6*  --  5.8*  ALBUMIN 2.5* 2.5* 2.3* 2.4* 2.3*  --  2.2* 2.4*   No results for  input(s): "LIPASE", "AMYLASE" in the last 168 hours. No results for input(s): "AMMONIA" in the last 168 hours. CBC: Recent Labs  Lab 03/31/23 0050 03/31/23 0915 04/03/23 1321 04/03/23 1843 04/04/23 0237 04/05/23 0118 04/05/23 0724 04/06/23 0311  WBC 14.7*   < > 7.1 7.4 7.5 8.3 7.9 8.7  NEUTROABS 10.3*  --  4.6  --  4.5  --   --  5.2  HGB 11.5*   < > 9.1* 9.7* 9.2* 9.6* 9.8* 9.6*  HCT 36.5   < > 28.6* 30.5* 29.0* 29.3* 30.7* 30.0*  MCV 99.7   < > 99.0 96.8 100.0 98.3 100.3* 96.8  PLT 159    < > 151 158 148* 170 175 202   < > = values in this interval not displayed.   Cardiac Enzymes: No results for input(s): "CKTOTAL", "CKMB", "CKMBINDEX", "TROPONINI" in the last 168 hours. BNP: BNP (last 3 results) No results for input(s): "BNP" in the last 8760 hours.  ProBNP (last 3 results) No results for input(s): "PROBNP" in the last 8760 hours.  CBG: Recent Labs  Lab 04/05/23 2021 04/06/23 0007 04/06/23 0436 04/06/23 0740 04/06/23 1135  GLUCAP 118* 95 172* 171* 231*       Signed:  Lacretia Nicks MD.  [facility] 04/06/2023, 2:47 PM

## 2023-04-06 NOTE — Progress Notes (Addendum)
Initial Nutrition Assessment  DOCUMENTATION CODES:   Not applicable  INTERVENTION:  Continue Vanilla Ensure Plus High Protein po daily, each supplement provides 350 kcal and 20 grams of protein (not needed at home)   Adjust TF's to home regimen  Nepro @ 50 ml/hr from 8PM-6AM (500 ml/day)  Prosource TF20 daily (not needed for home)  -TF's at goal rate provides 900 kcal, 41 g protein, 364 ml fluid  - 100 ml bolus of water q6 hours   Encourage po intake during the day   NUTRITION DIAGNOSIS:   Altered GI function related to constipation as evidenced by other (comment) (CT abdomen results). -ongoing   GOAL:   Patient will meet greater than or equal to 90% of their needs   MONITOR:   PO intake, Supplement acceptance, Labs, Weight trends, Skin, I & O's  REASON FOR ASSESSMENT:   Consult Enteral/tube feeding initiation and management, Assessment of nutrition requirement/status  ASSESSMENT:   77 y.o. female with PMHx including acute cholecystitis with cholelithiasis s/p lap cholecystostomy placement in 11/2022, sCHF EF 40%, ESRD on HD, CVA with Left side hemiparesis, dysphagia with PEG tube, T2DM, presents with fever  Visited patient at bedside whose daughter was present and was able to provide nutrition hx. She reports her mother tube feeds Nepro @40  ~9PM-6AM with 20 ml/hr FWF. Patient's daughter reports that there has been a supply issue with water bags for the dual system kangaroo pump, so we will have to administer water another way which will be via the 60ml EnFit syringe. Her daughter demonstrated knowledge of how to safely deliver liquid through a syringe without causing concerns for aspiration or emesis   Patient's daughter reports patient is followed by a dietitian or nutritionist at dialysis clinic who wishes for her weight to be around 130# which is what she weighs at this time per wt records.   Patient was reported to have an episode of emesis prior to RD's visit. Her  TF's were apparently started late and turned off late. Patient attempted to eat and drink some soda, which is when she ended up vomiting eventually. Patient began to cough all of sudden and likely gagged herself while her stomach was very full with food, liquids, and tube formula.  Labs: K+ 3.1, BUN 37, Cr 3.76, phos 1.8 Meds: dulcolax, rocephin, insulin, glafyl, miralax, NS, vancoready   Wt: 2.8 kg (5%) wt loss x 5 days?  03/30/23 56.2 kg  03/25/23 59 kg  03/17/23 59 kg  02/18/23 59 kg     PO: 0-25% x 2 documented meal   I/O's: +770 ml   Diet Order:   Diet Order             Diet - low sodium heart healthy           DIET DYS 3 Room service appropriate? Yes with Assist; Fluid consistency: Thin  Diet effective now                   EDUCATION NEEDS:   Not appropriate for education at this time  Skin:  Skin Assessment: Skin Integrity Issues: Skin Integrity Issues:: Stage II Stage II: sacrum  Last BM:  8/13 type 6/7  Height:   Ht Readings from Last 1 Encounters:  03/25/23 4\' 11"  (1.499 m)    Weight:   Wt Readings from Last 1 Encounters:  04/05/23 59.2 kg    Ideal Body Weight:     BMI:  Body mass index is 26.36 kg/m.  Estimated Nutritional Needs:   Kcal:  1375-1650  Protein:  65-80 g  Fluid:  > 1.6 L   Leodis Rains, RDN, LDN  Clinical Nutrition

## 2023-04-06 NOTE — Progress Notes (Signed)
[facility] Progress Note   Subjective:    Seen and examined patient at bedside. Patient's daughter also at bedside. Informed by patient's daughter of Ms. Springer recently vomited. Daughter expresses concern of patient going home since she vomited. She is on a dysphagia diet and on nocturnal TF's. RD following. Patient denies SOB, CP, and nausea as the time. Zofran PRN ordered. Next HD 8/14 if patient is still here.  Objective Vitals:   04/05/23 1648 04/05/23 2123 04/06/23 0436 04/06/23 0859  BP: 114/68 124/62 (!) 109/44 129/60  Pulse: 92 88 90 88  Resp: 16 20  16   Temp: 99.2 F (37.3 C) 98.3 F (36.8 C) 98.6 F (37 C) 98.3 F (36.8 C)  TempSrc:  Oral    SpO2: 99% 99% 97% 100%  Weight:       Physical Exam General: Alert, awake, NAD Heart: RRR, no murmurs, rubs or gallops Lungs: Clear anteriorly Abdomen: Soft, non-distended, +BS Extremities: No edema b/l lower extremities Dialysis Access:  R [facility]  Filed Weights   04/05/23 0500 04/05/23 0953 04/05/23 1411  Weight: 61.4 kg 60.2 kg 59.2 kg    Intake/Output Summary (Last 24 hours) at 04/06/2023 1253 Last data filed at 04/06/2023 0800 Gross per 24 hour  Intake 240 ml  Output 1000 ml  Net -760 ml    Additional Objective Labs: Basic Metabolic Panel: Recent Labs  Lab 04/05/23 0118 04/05/23 0724 04/06/23 0311  NA 134* 133* 134*  K 3.1* 3.2* 3.3*  CL 95* 95* 99  CO2 28 26 29   GLUCOSE 135* 192* 163*  BUN 29* 32* 14  CREATININE 2.30* 2.48* 1.79*  CALCIUM 8.4* 8.4* 8.4*  PHOS 3.8 3.8 1.9*   Liver Function Tests: Recent Labs  Lab 04/03/23 1321 04/03/23 1843 04/04/23 0237 04/04/23 1022 04/05/23 0724 04/06/23 0311  AST 24  --  29  --   --  28  ALT 20  --  16  --   --  20  ALKPHOS 76  --  69  --   --  68  BILITOT 0.3  --  0.4  --   --  0.2*  PROT 5.6*  --  5.6* 5.6*  --  5.8*  ALBUMIN 2.3*   < > 2.3*  --  2.2* 2.4*   < > = values in this interval not displayed.   No results for input(s):  "LIPASE", "AMYLASE" in the last 168 hours. CBC: Recent Labs  Lab 04/03/23 1321 04/03/23 1843 04/04/23 0237 04/05/23 0118 04/05/23 0724 04/06/23 0311  WBC 7.1 7.4 7.5 8.3 7.9 8.7  NEUTROABS 4.6  --  4.5  --   --  5.2  HGB 9.1* 9.7* 9.2* 9.6* 9.8* 9.6*  HCT 28.6* 30.5* 29.0* 29.3* 30.7* 30.0*  MCV 99.0 96.8 100.0 98.3 100.3* 96.8  PLT 151 158 148* 170 175 202   Blood Culture    Component Value Date/Time   SDES PLEURAL 04/04/2023 1010   SPECREQUEST NONE 04/04/2023 1010   CULT  04/04/2023 1010    NO GROWTH 2 DAYS Performed at [facility], [address], [place], [place] [address]    REPTSTATUS PENDING 04/04/2023 1010    Cardiac Enzymes: No results for input(s): "CKTOTAL", "CKMB", "CKMBINDEX", "TROPONINI" in the last 168 hours. CBG: Recent Labs  Lab 04/05/23 2021 04/06/23 0007 04/06/23 0436 04/06/23 0740 04/06/23 1135  GLUCAP 118* 95 172* 171* 231*   Iron Studies: No results for input(s): "IRON", "TIBC", "TRANSFERRIN", "FERRITIN" in the last 72 hours.  Lab Results  Component Value Date   INR 2.1 (H) 03/31/2023   INR 1.5 (H) 12/13/2022   Studies/Results: No results found.  Medications:  cefTRIAXone (ROCEPHIN)  IV 1 g (04/06/23 0338)   feeding supplement (NEPRO CARB STEADY)      apixaban  5 mg Per Tube BID   bisacodyl  5 mg Oral BID   carvedilol  3.125 mg Oral BID WC   Chlorhexidine Gluconate Cloth  6 each Topical Q0600   darbepoetin (ARANESP) injection - DIALYSIS  40 mcg Subcutaneous Q Mon-1800   feeding supplement  237 mL Oral Q24H   feeding supplement (PROSource TF20)  60 mL Per Tube Daily   free water  40 mL Per Tube Q2H   insulin aspart  0-6 Units Subcutaneous Q4H   insulin glargine-yfgn  10 Units Subcutaneous QHS   magnesium oxide  400 mg Oral Daily   polyethylene glycol  17 g Per Tube BID   sodium chloride flush  10-40 mL Intracatheter Q12H    Dialysis Orders: MWF SW  3h   350/600   59kg   2/2 bath  TDC   Heparin 2500+ 1500 midrun -  last OP HD 8/05, post wt 56.2kg   - no esa, no vdra - last Hb 12.2, pth 179  Assessment/Plan: SIRS/ fever/ ^wbc - unclear source, sig constipation per abd CT, but chole tube appeared to be in proper position. Blood cx's pending on IV abx. Per pmd.  S/p thoracentesis for effusion.  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 Using [facility]. Now back on MWF schedule. Next HD 8/14. Given KCL supplementation for K+ 3.2, K+ now 3.3-likely 2nd poor PO intake, on Mg supplementation as well. RD following.  HTN/ volume - euvolemic on exam, CXR w/o edema, on RA. BP's wnl.  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 MBD ckd - CCa in range, phos back down, PO phos X 1 ordered. She is not on a phos binder H/o CVA - w/ subsequent PEG tube. She is on nepro, on nocturnal Tfs. RD increased TF 8/12, if still low over the next few days may need to try a different formulation or a daily PO supplement H/o dementia  Salome Holmes, NP [facility] 04/06/2023,12:53 PM  LOS: 6 days

## 2023-04-07 ENCOUNTER — Telehealth: Payer: Self-pay | Admitting: Internal Medicine

## 2023-04-07 ENCOUNTER — Telehealth (HOSPITAL_COMMUNITY): Payer: Self-pay

## 2023-04-07 LAB — GLUCOSE, CAPILLARY
Glucose-Capillary: 115 mg/dL — ABNORMAL HIGH (ref 70–99)
Glucose-Capillary: 142 mg/dL — ABNORMAL HIGH (ref 70–99)
Glucose-Capillary: 96 mg/dL (ref 70–99)

## 2023-04-07 MED ORDER — LIDOCAINE HCL (PF) 1 % IJ SOLN: 5.0000 mL | INTRAMUSCULAR | Status: DC | PRN

## 2023-04-07 MED ORDER — ALTEPLASE 2 MG IJ SOLR: 2.0000 mg | Freq: Once | INTRAMUSCULAR | Status: DC | PRN

## 2023-04-07 MED ORDER — PENTAFLUOROPROP-TETRAFLUOROETH EX AERO: 1.0000 | INHALATION_SPRAY | CUTANEOUS | Status: DC | PRN

## 2023-04-07 MED ORDER — HEPARIN SODIUM (PORCINE) 1000 UNIT/ML DIALYSIS
1000.0000 [IU] | INTRAMUSCULAR | Status: DC | PRN
  Filled 2023-04-07: qty 1

## 2023-04-07 MED ORDER — LIDOCAINE-PRILOCAINE 2.5-2.5 % EX CREA: 1.0000 | TOPICAL_CREAM | CUTANEOUS | Status: DC | PRN

## 2023-04-07 NOTE — Procedures (Signed)
I was present at this dialysis session. I have reviewed the session itself and made appropriate changes.   Filed Weights   04/05/23 0953 04/05/23 1411 04/07/23 0740  Weight: 60.2 kg 59.2 kg 58.5 kg    Recent Labs  Lab 04/07/23 0610  NA 138  K 3.2*  CL 101  CO2 24  GLUCOSE 132*  BUN 25*  CREATININE 2.48*  CALCIUM 8.2*  PHOS 3.4    Recent Labs  Lab 04/04/23 0237 04/05/23 0118 04/05/23 0724 04/06/23 0311 04/07/23 0610  WBC 7.5   < > 7.9 8.7 8.6  NEUTROABS 4.5  --   --  5.2 4.9  HGB 9.2*   < > 9.8* 9.6* 9.0*  HCT 29.0*   < > 30.7* 30.0* 28.5*  MCV 100.0   < > 100.3* 96.8 97.9  PLT 148*   < > 175 202 201   < > = values in this interval not displayed.    Scheduled Meds:  apixaban  5 mg Per Tube BID   bisacodyl  5 mg Oral BID   carvedilol  3.125 mg Oral BID WC   Chlorhexidine Gluconate Cloth  6 each Topical Q0600   darbepoetin (ARANESP) injection - DIALYSIS  40 mcg Subcutaneous Q Mon-1800   feeding supplement  237 mL Oral Q24H   feeding supplement (PROSource TF20)  60 mL Per Tube Daily   free water  100 mL Per Tube Q6H   insulin aspart  0-6 Units Subcutaneous Q4H   insulin glargine-yfgn  10 Units Subcutaneous QHS   magnesium oxide  400 mg Oral Daily   polyethylene glycol  17 g Per Tube BID   sodium chloride flush  10-40 mL Intracatheter Q12H   Continuous Infusions:  cefTRIAXone (ROCEPHIN)  IV 1 g (04/07/23 0345)   PRN Meds:.acetaminophen **OR** acetaminophen, alteplase, heparin, hydrALAZINE, ipratropium-albuterol, lidocaine (PF), lidocaine-prilocaine, metoprolol tartrate, ondansetron **OR** ondansetron (ZOFRAN) IV, pentafluoroprop-tetrafluoroeth, senna-docusate, sodium chloride flush, traZODone   Louie Bun,  MD 04/07/2023, 10:55 AM

## 2023-04-07 NOTE — Plan of Care (Signed)

## 2023-04-07 NOTE — Progress Notes (Addendum)
[facility] Progress Note   Subjective:    Seen and examined patient on HD. She denies SOB, dizziness, and CP. Tolerating UFG 1L. BP is 128/66. Patient should be going home after HD today.  Objective Vitals:   04/06/23 2005 04/07/23 0740 04/07/23 0749 04/07/23 0800  BP: (!) 119/58 (!) 135/55 132/81 126/66  Pulse: 92 91 90 94  Resp: 18 16 16 18   Temp: 98.6 F (37 C) 97.7 F (36.5 C)    TempSrc: Oral     SpO2: 97% 100% 99% 97%  Weight:  58.5 kg     Physical Exam General: Alert, awake, NAD Heart: RRR, no murmurs, rubs or gallops Lungs: Clear anteriorly and laterally Abdomen: Soft, non-distended, +BS Extremities: No edema b/l lower extremities Dialysis Access:  R [facility]  Filed Weights   04/05/23 0953 04/05/23 1411 04/07/23 0740  Weight: 60.2 kg 59.2 kg 58.5 kg    Intake/Output Summary (Last 24 hours) at 04/07/2023 0843 Last data filed at 04/07/2023 0000 Gross per 24 hour  Intake 240 ml  Output 0 ml  Net 240 ml    Additional Objective Labs: Basic Metabolic Panel: Recent Labs  Lab 04/05/23 0724 04/06/23 0311 04/07/23 0610  NA 133* 134* 138  K 3.2* 3.3* 3.2*  CL 95* 99 101  CO2 26 29 24   GLUCOSE 192* 163* 132*  BUN 32* 14 25*  CREATININE 2.48* 1.79* 2.48*  CALCIUM 8.4* 8.4* 8.2*  PHOS 3.8 1.9* 3.4   Liver Function Tests: Recent Labs  Lab 04/03/23 1321 04/03/23 1843 04/04/23 0237 04/04/23 1022 04/05/23 0724 04/06/23 0311 04/07/23 0610  AST 24  --  29  --   --  28  --   ALT 20  --  16  --   --  20  --   ALKPHOS 76  --  69  --   --  68  --   BILITOT 0.3  --  0.4  --   --  0.2*  --   PROT 5.6*  --  5.6* 5.6*  --  5.8*  --   ALBUMIN 2.3*   < > 2.3*  --  2.2* 2.4* 2.2*   < > = values in this interval not displayed.   No results for input(s): "LIPASE", "AMYLASE" in the last 168 hours. CBC: Recent Labs  Lab 04/04/23 0237 04/05/23 0118 04/05/23 0724 04/06/23 0311 04/07/23 0610  WBC 7.5 8.3 7.9 8.7 8.6  NEUTROABS 4.5  --   --  5.2 4.9   HGB 9.2* 9.6* 9.8* 9.6* 9.0*  HCT 29.0* 29.3* 30.7* 30.0* 28.5*  MCV 100.0 98.3 100.3* 96.8 97.9  PLT 148* 170 175 202 201   Blood Culture    Component Value Date/Time   SDES PLEURAL 04/04/2023 1010   SPECREQUEST NONE 04/04/2023 1010   CULT  04/04/2023 1010    NO GROWTH 2 DAYS Performed at [facility], [address] [place], [place] [address]    REPTSTATUS PENDING 04/04/2023 1010    Cardiac Enzymes: No results for input(s): "CKTOTAL", "CKMB", "CKMBINDEX", "TROPONINI" in the last 168 hours. CBG: Recent Labs  Lab 04/06/23 1617 04/06/23 2006 04/06/23 2352 04/07/23 0332 04/07/23 0718  GLUCAP 134* 81 66* 115* 142*   Iron Studies: No results for input(s): "IRON", "TIBC", "TRANSFERRIN", "FERRITIN" in the last 72 hours. Lab Results  Component Value Date   INR 2.1 (H) 03/31/2023   INR 1.5 (H) 12/13/2022   Studies/Results: No results found.  Medications:  cefTRIAXone (ROCEPHIN)  IV 1 g (04/07/23 0345)    apixaban  5 mg Per Tube BID   bisacodyl  5 mg Oral BID   carvedilol  3.125 mg Oral BID WC   Chlorhexidine Gluconate Cloth  6 each Topical Q0600   darbepoetin (ARANESP) injection - DIALYSIS  40 mcg Subcutaneous Q Mon-1800   feeding supplement  237 mL Oral Q24H   feeding supplement (PROSource TF20)  60 mL Per Tube Daily   free water  100 mL Per Tube Q6H   insulin aspart  0-6 Units Subcutaneous Q4H   insulin glargine-yfgn  10 Units Subcutaneous QHS   magnesium oxide  400 mg Oral Daily   polyethylene glycol  17 g Per Tube BID   sodium chloride flush  10-40 mL Intracatheter Q12H    Dialysis Orders: MWF SW  3h   350/600   59kg   2/2 bath  TDC   Heparin 2500+ 1500 midrun - last OP HD 8/05, post wt 56.2kg   - no esa, no vdra - last Hb 12.2, pth 179  Assessment/Plan: SIRS/ fever/ ^wbc - unclear source, sig constipation per abd CT, but chole tube appeared to be in proper position. Blood cx's pending on IV abx. Per pmd.  S/p thoracentesis for effusion.   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 Using [facility]. Now back on MWF schedule. On HD. Given KCL supplementation for K+ 3.2, K+ now 3.3-likely 2nd poor PO intake, on Mg supplementation as well. RD following. Continue 4K bath with HD. HTN/ volume - euvolemic on exam, CXR w/o edema, on RA. BP's wnl. Meeting EDW. 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 MBD ckd - CCa in range, phos back to goal-PO supplementation given yesterday. She is not on a phos binder H/o CVA - w/ subsequent PEG tube. She is on nepro, on nocturnal Tfs. RD increased TF 8/12, if still low over the next few days may need to try a different formulation or a daily PO supplement H/o dementia Dispo-Okay for discharge today after hemodialysis from a renal standpoint.  Salome Holmes, NP [facility] 04/07/2023,8:43 AM  LOS: 7 days

## 2023-04-07 NOTE — Telephone Encounter (Signed)
Pt being discharge from [facility] today . [facility] need a referral to continue services

## 2023-04-07 NOTE — Telephone Encounter (Signed)
Called to schedule drain exchange, no answer, left vm. AB 

## 2023-04-07 NOTE — Progress Notes (Signed)
DISCHARGE NOTE HOME Samantha [name] to be discharged Home per MD order. Discussed prescriptions and follow up appointments with the patient and daughter [name]. Prescriptions given to patient daughter; medication list explained in detail. Daughter verbalized understanding.  Skin clean, dry and intact without evidence of skin break down, no evidence of skin tears noted. IV catheter discontinued intact. Site without signs and symptoms of complications. Dressing and pressure applied. Pt denies pain at the site currently. No complaints noted.  Patient free of lines, drains, and wounds.   An After Visit Summary (AVS) was printed and given to the patient. Patient transferred to home via Melany Guernsey, RN

## 2023-04-07 NOTE — Progress Notes (Signed)
Pt d/c today. Contacted [facility] to advise clinic of pt's d/c today and that pt should resume care on Friday.   Olivia Canter Renal Navigator [phone]

## 2023-04-07 NOTE — Progress Notes (Signed)
Patient seen and examined during hemodialysis.  No interval complaints reported.  Please refer to discharge instructions dated 04/06/2023

## 2023-04-07 NOTE — Plan of Care (Signed)
Problem: Education: Goal: Ability to describe self-care measures that may prevent or decrease complications (Diabetes Survival Skills Education) will improve 04/07/2023 1227 by Margarita Grizzle, RN Outcome: Adequate for Discharge 04/07/2023 1227 by Margarita Grizzle, RN Outcome: Progressing Goal: Individualized Educational Video(s) 04/07/2023 1227 by Margarita Grizzle, RN Outcome: Adequate for Discharge 04/07/2023 1227 by Margarita Grizzle, RN Outcome: Progressing   Problem: Coping: Goal: Ability to adjust to condition or change in health will improve 04/07/2023 1227 by Margarita Grizzle, RN Outcome: Adequate for Discharge 04/07/2023 1227 by Margarita Grizzle, RN Outcome: Progressing   Problem: Fluid Volume: Goal: Ability to maintain a balanced intake and output will improve 04/07/2023 1227 by Margarita Grizzle, RN Outcome: Adequate for Discharge 04/07/2023 1227 by Margarita Grizzle, RN Outcome: Progressing   Problem: Health Behavior/Discharge Planning: Goal: Ability to identify and utilize available resources and services will improve 04/07/2023 1227 by Margarita Grizzle, RN Outcome: Adequate for Discharge 04/07/2023 1227 by Margarita Grizzle, RN Outcome: Progressing Goal: Ability to manage health-related needs will improve 04/07/2023 1227 by Margarita Grizzle, RN Outcome: Adequate for Discharge 04/07/2023 1227 by Margarita Grizzle, RN Outcome: Progressing   Problem: Metabolic: Goal: Ability to maintain appropriate glucose levels will improve 04/07/2023 1227 by Margarita Grizzle, RN Outcome: Adequate for Discharge 04/07/2023 1227 by Margarita Grizzle, RN Outcome: Progressing   Problem: Nutritional: Goal: Maintenance of adequate nutrition will improve 04/07/2023 1227 by Margarita Grizzle, RN Outcome: Adequate for Discharge 04/07/2023 1227 by Margarita Grizzle, RN Outcome: Progressing Goal: Progress toward achieving an optimal weight will improve 04/07/2023 1227 by Margarita Grizzle, RN Outcome: Adequate for Discharge 04/07/2023  1227 by Margarita Grizzle, RN Outcome: Progressing   Problem: Skin Integrity: Goal: Risk for impaired skin integrity will decrease 04/07/2023 1227 by Margarita Grizzle, RN Outcome: Adequate for Discharge 04/07/2023 1227 by Margarita Grizzle, RN Outcome: Progressing   Problem: Tissue Perfusion: Goal: Adequacy of tissue perfusion will improve 04/07/2023 1227 by Margarita Grizzle, RN Outcome: Adequate for Discharge 04/07/2023 1227 by Margarita Grizzle, RN Outcome: Progressing   Problem: Education: Goal: Knowledge of General Education information will improve Description: Including pain rating scale, medication(s)/side effects and non-pharmacologic comfort measures 04/07/2023 1227 by Margarita Grizzle, RN Outcome: Adequate for Discharge 04/07/2023 1227 by Margarita Grizzle, RN Outcome: Progressing   Problem: Health Behavior/Discharge Planning: Goal: Ability to manage health-related needs will improve 04/07/2023 1227 by Margarita Grizzle, RN Outcome: Adequate for Discharge 04/07/2023 1227 by Margarita Grizzle, RN Outcome: Progressing   Problem: Clinical Measurements: Goal: Ability to maintain clinical measurements within normal limits will improve 04/07/2023 1227 by Margarita Grizzle, RN Outcome: Adequate for Discharge 04/07/2023 1227 by Margarita Grizzle, RN Outcome: Progressing Goal: Will remain free from infection 04/07/2023 1227 by Margarita Grizzle, RN Outcome: Adequate for Discharge 04/07/2023 1227 by Margarita Grizzle, RN Outcome: Progressing Goal: Diagnostic test results will improve 04/07/2023 1227 by Margarita Grizzle, RN Outcome: Adequate for Discharge 04/07/2023 1227 by Margarita Grizzle, RN Outcome: Progressing Goal: Respiratory complications will improve 04/07/2023 1227 by Margarita Grizzle, RN Outcome: Adequate for Discharge 04/07/2023 1227 by Margarita Grizzle, RN Outcome: Progressing Goal: Cardiovascular complication will be avoided 04/07/2023 1227 by Margarita Grizzle, RN Outcome: Adequate for Discharge 04/07/2023 1227  by Margarita Grizzle, RN Outcome: Progressing   Problem: Activity: Goal: Risk for activity intolerance will decrease 04/07/2023 1227 by Margarita Grizzle, RN Outcome: Adequate for Discharge 04/07/2023 1227 by Margarita Grizzle, RN Outcome: Progressing   Problem: Nutrition: Goal: Adequate nutrition will be maintained 04/07/2023 1227 by Margarita Grizzle, RN Outcome: Adequate for Discharge 04/07/2023 1227 by Margarita Grizzle, RN Outcome: Progressing  Problem: Coping: Goal: Level of anxiety will decrease 04/07/2023 1227 by Margarita Grizzle, RN Outcome: Adequate for Discharge 04/07/2023 1227 by Margarita Grizzle, RN Outcome: Progressing   Problem: Elimination: Goal: Will not experience complications related to bowel motility 04/07/2023 1227 by Margarita Grizzle, RN Outcome: Adequate for Discharge 04/07/2023 1227 by Margarita Grizzle, RN Outcome: Progressing Goal: Will not experience complications related to urinary retention 04/07/2023 1227 by Margarita Grizzle, RN Outcome: Adequate for Discharge 04/07/2023 1227 by Margarita Grizzle, RN Outcome: Progressing   Problem: Pain Managment: Goal: General experience of comfort will improve 04/07/2023 1227 by Margarita Grizzle, RN Outcome: Adequate for Discharge 04/07/2023 1227 by Margarita Grizzle, RN Outcome: Progressing   Problem: Safety: Goal: Ability to remain free from injury will improve 04/07/2023 1227 by Margarita Grizzle, RN Outcome: Adequate for Discharge 04/07/2023 1227 by Margarita Grizzle, RN Outcome: Progressing   Problem: Skin Integrity: Goal: Risk for impaired skin integrity will decrease 04/07/2023 1227 by Margarita Grizzle, RN Outcome: Adequate for Discharge 04/07/2023 1227 by Margarita Grizzle, RN Outcome: Progressing

## 2023-04-07 NOTE — Progress Notes (Signed)
   04/07/23 1130  Vitals  Temp 97.7 F (36.5 C)  Pulse Rate 92  Resp 16  BP 125/62  SpO2 98 %  O2 Device Room Air  Weight 57.7 kg  Type of Weight Post-Dialysis  Oxygen Therapy  Patient Activity (if Appropriate) In bed  Pulse Oximetry Type Continuous  Oximetry Probe Site Changed No  Post Treatment  Dialyzer Clearance Lightly streaked  Hemodialysis Intake (mL) 0 mL  Liters Processed 84  Fluid Removed (mL) 1000 mL  Tolerated HD Treatment Yes  During Treatment Monitoring  Duration of HD Treatment -hour(s) 3.5 hour(s)   Received patient in bed to unit.  Alert and oriented.  Informed consent signed and in chart.   TX duration:3.5  Patient tolerated well.  Transported back to the room  Alert, without acute distress.  Hand-off given to patient's nurse.   Access used: [facility] Access issues: no complications  Total UF removed: 1000 Medication(s) given: none    [facility]

## 2023-04-07 NOTE — TOC Transition Note (Addendum)
Transition of Care [facility]) - CM/SW Discharge Note   Patient Details  Name: Samantha [name] MRN: [id number] Date of Birth: [birth year]  Transition of Care [facility]) CM/SW Contact:  Tom-Johnson, Hershal Coria, RN Phone Number: 04/07/2023, 12:37 PM   Clinical Narrative:     Patient is scheduled for discharge today. Patient will be staying at daughter, Samantha [name]'s home. Readmission Risk Assessment done. Home Health info, Outpatient referral, hospital f/u and discharge instructions on AVS. PTAR scheduled to transport at discharge.  No further TOC needs noted.       Final next level of care: Home w Home Health Services Barriers to Discharge: Barriers Resolved   Patient Goals and CMS Choice CMS Medicare.gov Compare Post Acute Care list provided to:: Patient Choice offered to / list presented to : Patient, Adult Children Samantha [name])  Discharge Placement                  Patient to be transferred to facility by: PTAR Name of family member notified: [name] Additional resources added to the After Visit Summary for                  DME Arranged: N/A DME Agency: NA       HH Arranged: PT, OT, RN, Nurse's Aide, Social Work [employer] Agency: [facility] Date HH Agency Contacted: 04/01/23 Time HH Agency Contacted: 1548 Representative spoke with at [facility] Agency: Tresa Endo  Social Determinants of Health (SDOH) Interventions SDOH Screenings   Food Insecurity: No Food Insecurity (12/12/2022)  Housing: Low Risk  (12/12/2022)  Transportation Needs: No Transportation Needs (12/12/2022)  Utilities: Not At Risk (12/12/2022)  Depression (PHQ2-9): Low Risk  (03/25/2023)  Social Connections: Unknown (02/08/2023)   [facility] from [facility]  Tobacco Use: Unknown (03/31/2023)     Readmission Risk Interventions    04/01/2023    3:49 PM  Readmission Risk Prevention Plan  Transportation Screening Complete  Medication Review (RN Care Manager)  Referral to Pharmacy  PCP or Specialist appointment within 3-5 days of discharge Complete  HRI or Home Care Consult Complete  SW Recovery Care/Counseling Consult Complete  Palliative Care Screening Not Applicable  Skilled Nursing Facility Not Applicable

## 2023-04-07 NOTE — Telephone Encounter (Signed)
Please advise 

## 2023-04-08 ENCOUNTER — Telehealth: Payer: Self-pay

## 2023-04-08 NOTE — Addendum Note (Signed)
Addended by: Mary Sella D on: 04/08/2023 10:59 AM   Modules accepted: Orders

## 2023-04-08 NOTE — Telephone Encounter (Signed)
Okay for referral for home health services

## 2023-04-08 NOTE — TOC Transition Note (Signed)
Transition of Care - Initial Contact from Inpatient Facility  Date of discharge: 04/07/23 Date of contact: 04/08/23  Method: Attempted Phone Call Spoke to: No Answer  Patient's daughter was contacted to discuss transition of care from recent inpatient hospitalization. Patient's daughter didn't answer the phone at this time. I left a voicemail specifying a change to patient's ABX regimen: advised patient to take Cefdinir 300mg  every other day (48hrs) X 3 days instead of daily (Q48hrs is renal appropriate).  Patient will return to her outpatient HD unit today at [facility]. Next HD on 04/09/23.  Salome Holmes, NP

## 2023-04-08 NOTE — Transitions of Care (Post Inpatient/ED Visit) (Signed)
   04/08/2023  Name: Samantha [name] MRN: [id number] DOB: [birth year]  Today's TOC FU Call Status: Today's TOC FU Call Status:: Unsuccessful Call (1st Attempt) Unsuccessful Call (1st Attempt) Date: 04/08/23  Attempted to reach the patient regarding the most recent Inpatient/ED visit.  Follow Up Plan: Additional outreach attempts will be made to reach the patient to complete the Transitions of Care (Post Inpatient/ED visit) call.   Jodelle Gross, RN, BSN, CCM Care Management Coordinator [facility] Phone: [phone]/Fax: [phone]

## 2023-04-08 NOTE — Plan of Care (Signed)
[facility] Patient Discharge Orders- [facility] CLINIC: [facility]-DC to home with daughter  Patient's name: Samantha [name] Admit/DC Dates: 03/30/2023 - 04/07/2023  Discharge Diagnoses: SIRS/Fever, unknown etiology, work-up here (-),    History of acalculous colitis  - Underwent cholecystostomy tube placement in April 2024, follows outpatient IR.  This has been dislodged in between and exchanged.  Currently output looks okay, we will reconsult IR to ensure proper functioning of this.  Drainage at this time does not appear to be infected. -Persistent cholelithiasis on CT scan but tube appears to be stable with decompressed gallbladder --appreciate IR, drain appropriate -- discussed with surgery - of note, per 02/16/23 outpatient note she's likely prohibitive risk for surgery - as drain functioning and she's improving, will defer official consult  Aranesp: Given: Yes    Date and amount of last dose: on 04/05/23   Last Hgb: 9 PRBC's Given: No  ESA dose for discharge: mircera 50 mcg IV q 2 weeks  IV Iron dose at discharge: No  Heparin change: No  EDW Change: Yes New EDW: Lower to 58.5kg for accuracy  Bath Change: Change to 3K bath, last K+ here 3.2  Access intervention/Change: No   Hectorol/Calcitriol change: No  Discharge Labs: Calcium 8.2 Phosphorus 3.4 Albumin 2.2 K+ 3.2  IV Antibiotics: Cefdinir 300mg  daily X 3 days for SIRS/fever. This regimen actually needs to be change to 300mg  every other day X 3 days (renal regimen). I will call the daughter today to make her aware of this change.   On Coumadin?: No   OTHER/APPTS/LAB ORDERS: Please check weekly K+ levels while on 3K bath    D/C Meds to be reconciled by nurse after every discharge.  Completed By: Salome Holmes, NP   Reviewed by: MD:______ RN_______

## 2023-04-09 ENCOUNTER — Telehealth: Payer: Self-pay

## 2023-04-09 NOTE — Transitions of Care (Post Inpatient/ED Visit) (Signed)
   04/09/2023  Name: Samantha [name] MRN: [id number] DOB: [birth year]  Today's TOC FU Call Status: Today's TOC FU Call Status:: Unsuccessful Call (2nd Attempt) Unsuccessful Call (2nd Attempt) Date: 04/09/23  Attempted to reach the patient regarding the most recent Inpatient/ED visit.  Follow Up Plan: Additional outreach attempts will be made to reach the patient to complete the Transitions of Care (Post Inpatient/ED visit) call.   Jodelle Gross, RN, BSN, CCM Care Management Coordinator [facility] Phone: [phone]/Fax: [phone]

## 2023-04-12 ENCOUNTER — Telehealth: Payer: Self-pay | Admitting: Internal Medicine

## 2023-04-12 ENCOUNTER — Telehealth: Payer: Self-pay

## 2023-04-12 NOTE — Telephone Encounter (Signed)
Rinaldo Cloud was called and given verbal orders to resume [facility] care

## 2023-04-12 NOTE — Telephone Encounter (Signed)
Verbal order : okay to resume home health care with patient . Frequency 1 x week/5 weeks. 2 as needed visits

## 2023-04-12 NOTE — Transitions of Care (Post Inpatient/ED Visit) (Signed)
   04/12/2023  Name: Samantha [name] MRN: [id number] DOB: 1946-04-04  Today's TOC FU Call Status: Today's TOC FU Call Status:: Unsuccessful Call (3rd Attempt) Unsuccessful Call (3rd Attempt) Date: 04/12/23  Attempted to reach the patient regarding the most recent Inpatient/ED visit.  Follow Up Plan: No further outreach attempts will be made at this time. We have been unable to contact the patient.  Jodelle Gross, RN, BSN, CCM Care Management Coordinator [facility] Phone: [phone]/Fax: [phone]

## 2023-04-12 NOTE — Telephone Encounter (Signed)
04/12/23 -  HH ORDERS   Caller Name: [facility] Agency Name: [facility] Callback Phone #: [phone]  Service Requested: Resumption of care / Nurse care set up. Pt came home from hosp 04/08/23. Pt has some kind of infection/fever.  Approval of care is needed.  Frequency of Visits: 1 x wk / 5wks. 2 as needed visit.

## 2023-04-12 NOTE — Telephone Encounter (Signed)
Please advise 

## 2023-04-13 ENCOUNTER — Other Ambulatory Visit (HOSPITAL_COMMUNITY): Payer: Self-pay | Admitting: Radiology

## 2023-04-13 ENCOUNTER — Ambulatory Visit (HOSPITAL_COMMUNITY)
Admission: RE | Admit: 2023-04-13 | Discharge: 2023-04-13 | Disposition: A | Discharge status: Home / Self Care | Payer: Medicare HMO | Source: Ambulatory Visit | Attending: Radiology | Admitting: Radiology

## 2023-04-13 ENCOUNTER — Telehealth: Payer: Self-pay | Admitting: Internal Medicine

## 2023-04-13 DIAGNOSIS — K810 Acute cholecystitis: Secondary | ICD-10-CM

## 2023-04-13 DIAGNOSIS — Z434 Encounter for attention to other artificial openings of digestive tract: Secondary | ICD-10-CM | POA: Insufficient documentation

## 2023-04-13 DIAGNOSIS — I517 Cardiomegaly: Secondary | ICD-10-CM

## 2023-04-13 DIAGNOSIS — Z992 Dependence on renal dialysis: Secondary | ICD-10-CM

## 2023-04-13 DIAGNOSIS — Z794 Long term (current) use of insulin: Secondary | ICD-10-CM

## 2023-04-13 HISTORY — PX: IR EXCHANGE BILIARY DRAIN: IMG6046

## 2023-04-13 MED ORDER — LIDOCAINE HCL 1 % IJ SOLN
INTRAMUSCULAR | Status: AC
  Filled 2023-04-13: qty 20

## 2023-04-13 MED ORDER — IOHEXOL 300 MG/ML  SOLN
50.0000 mL | Freq: Once | INTRAMUSCULAR | Status: AC | PRN
  Administered 2023-04-13: 10 mL

## 2023-04-13 MED ORDER — LIDOCAINE HCL (PF) 1 % IJ SOLN
30.0000 mL | Freq: Once | INTRAMUSCULAR | Status: AC
  Administered 2023-04-13: 5 mL

## 2023-04-13 NOTE — Telephone Encounter (Signed)
Request for verbal orders ( [facility]e [facility] called  can be reached at  [phone]

## 2023-04-13 NOTE — Telephone Encounter (Signed)
Daugther was informed that paperwork was sent to scan into patient chart and a call to [facility] was made to see what could be done until documents show up into patient chart

## 2023-04-13 NOTE — Procedures (Signed)
Interventional Radiology Procedure Note  Procedure: Image guided perc chole exchange.  80F pigtail drain.  Complications: None   Recommendations: - Routine drain care  - consider referral to VIR clinic to discuss spyglass - routine drain exchange  Signed,  Yvone Neu. Loreta Ave, DO

## 2023-04-13 NOTE — Telephone Encounter (Signed)
Pt daughter would like for you to give her a call to discuss some things..[phone] jennifer

## 2023-04-14 NOTE — Telephone Encounter (Signed)
Attempted to call Lorie Apley in regards to patient CAP paperwork a voicemail was left asking to return my call

## 2023-04-14 NOTE — Telephone Encounter (Signed)
Please advise 

## 2023-04-15 ENCOUNTER — Other Ambulatory Visit: Payer: Self-pay | Admitting: Internal Medicine

## 2023-04-15 DIAGNOSIS — Z794 Long term (current) use of insulin: Secondary | ICD-10-CM

## 2023-04-15 LAB — LAB REPORT - SCANNED: A1c: 7

## 2023-04-15 MED ORDER — CARVEDILOL 3.125 MG PO TABS: 3.1250 mg | ORAL_TABLET | Freq: Two times a day (BID) | ORAL | 1 refills | Status: DC

## 2023-04-15 MED ORDER — APIXABAN 5 MG PO TABS: 5.0000 mg | ORAL_TABLET | Freq: Two times a day (BID) | ORAL | 1 refills | Status: DC

## 2023-04-15 MED ORDER — INSULIN PEN NEEDLE 32G X 4 MM MISC
3 refills | Status: DC
Start: 2023-04-15 — End: 2023-04-19

## 2023-04-15 NOTE — Telephone Encounter (Signed)
Prescription Request  04/15/2023  LOV: 03/25/2023  What is the name of the medication or equipment? carvedilol (COREG) 3.125 MG tablet [[id number]] and apixaban (ELIQUIS) 5 MG TABS tablet [[id number]]   Have you contacted your pharmacy to request a refill? No   Which pharmacy would you like this sent to?     [facility] [address] [place] - [address] [place] [address] Phone: [phone] Fax: [phone]   Patient notified that their request is being sent to the clinical staff for review and that they should receive a response within 2 business days.   Please advise at Mobile [phone] (mobile)

## 2023-04-15 NOTE — Telephone Encounter (Signed)
Called pt's daughter, relayed message below. She verbalized understanding.

## 2023-04-15 NOTE — Telephone Encounter (Signed)
Pt checking on status of this message.   She is also needing apixaban (ELIQUIS) 5 MG TABS tablet [[id number]], she will be out by 10/17/22 of this script.   She is needing Insulin needles.   carvedilol (COREG) 3.125 MG tablet [[id number]] she is out of this med.  She needs by tonight.   Please call her daughter [name] when sent, she is very anxious about not getting this script. Samantha [name]'s [phone], she will pick up the scripts

## 2023-04-15 NOTE — Telephone Encounter (Signed)
Meds and insulin needles sent in to pharmacy

## 2023-04-15 NOTE — Telephone Encounter (Signed)
Is it ok to refill? 

## 2023-04-15 NOTE — Telephone Encounter (Signed)
Which orders to do they need?

## 2023-04-15 NOTE — Telephone Encounter (Signed)
Noted  

## 2023-04-16 ENCOUNTER — Telehealth: Payer: Self-pay | Admitting: Internal Medicine

## 2023-04-16 NOTE — Telephone Encounter (Signed)
Jordan Hawks [phone]   They stated that the script needs to be written as specific use and how many times a day for insurance to cover. Please resend  Insulin Pen Needle 32G X 4 MM MISC [[id number]]   [id number] - [place], [place] - [address] [place] [address] Phone: [phone]  Fax: [phone]

## 2023-04-18 ENCOUNTER — Encounter: Payer: Self-pay | Admitting: Internal Medicine

## 2023-04-18 ENCOUNTER — Telehealth: Payer: Medicare HMO | Admitting: Family

## 2023-04-18 DIAGNOSIS — E119 Type 2 diabetes mellitus without complications: Secondary | ICD-10-CM

## 2023-04-18 DIAGNOSIS — Z794 Long term (current) use of insulin: Secondary | ICD-10-CM

## 2023-04-18 DIAGNOSIS — Z931 Gastrostomy status: Secondary | ICD-10-CM

## 2023-04-18 NOTE — Progress Notes (Signed)
Virtual Visit Consent   [name], you are scheduled for a virtual visit with a  provider today. Just as with appointments in the office, your consent must be obtained to participate. Your consent will be active for this visit and any virtual visit you may have with one of our providers in the next 365 days. If you have a MyChart account, a copy of this consent can be sent to you electronically.  As this is a virtual visit, video technology does not allow for your provider to perform a traditional examination. This may limit your provider's ability to fully assess your condition. If your provider identifies any concerns that need to be evaluated in person or the need to arrange testing (such as labs, EKG, etc.), we will make arrangements to do so. Although advances in technology are sophisticated, we cannot ensure that it will always work on either your end or our end. If the connection with a video visit is poor, the visit may have to be switched to a telephone visit. With either a video or telephone visit, we are not always able to ensure that we have a secure connection.  By engaging in this virtual visit, you consent to the provision of healthcare and authorize for your insurance to be billed (if applicable) for the services provided during this visit. Depending on your insurance coverage, you may receive a charge related to this service.  I need to obtain your verbal consent now. Are you willing to proceed with your visit today? Samantha [name] has provided verbal consent on 04/18/2023 for a virtual visit (video or telephone). [name], FNP  Date: 04/18/2023 4:13 PM  Virtual Visit via Video Note   I, [name], connected with  Samantha [name]  ([id number], 76-Oct-1947) on 04/18/23 at  4:00 PM EDT by a video-enabled telemedicine application and verified that I am speaking with the correct person using two identifiers.  Location: Patient: Virtual Visit Location Patient:  Home Provider: Virtual Visit Location Provider: Home Office   I discussed the limitations of evaluation and management by telemedicine and the availability of in person appointments. The patient expressed understanding and agreed to proceed.    History of Present Illness: Samantha [name] is a 77 y.o. who identifies as a female who was assigned female at birth, and is being seen today for elevated glucose. She has a g tube and gets Nepro feeding for 10 hours at night. This was recently changed from Osmolite when discharged from the hospital.   Pt is currently doing Novolog sliding scale and Lantus 16 units at night.   Daughter is doing all her medications. Her glucose this morning was 325. She was very worried this too elevated. She has follow up with PCP on Tuesday.   HPI: Diabetes She presents for her follow-up diabetic visit. She has type 2 diabetes mellitus.    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   Epigastric pain 12/13/2022   End-stage renal disease on hemodialysis (HCC) 12/13/2022   Failure to thrive in adult 12/13/2022   DM2 (diabetes mellitus, type 2) (HCC) 12/13/2022   Essential hypertension 12/13/2022   History of DVT (deep vein thrombosis) 12/13/2022   Acute cholecystitis 12/12/2022   Diabetic peripheral neuropathy (HCC) 07/12/2019  Alzheimer's disease (HCC) 07/12/2019   Cerebrovascular accident (CVA) due to occlusion of cerebral artery (HCC) 07/12/2019    Allergies:  Allergies  Allergen Reactions   Aspirin Anaphylaxis   Penicillins Anaphylaxis    **Tolerates cephalosporins   Medications:  Current Outpatient Medications:    acetaminophen (TYLENOL) 500 MG tablet, Place 500 mg into feeding tube every 6 (six)  hours as needed for mild pain., Disp: , Rfl:    apixaban (ELIQUIS) 5 MG TABS tablet, Place 1 tablet (5 mg total) into feeding tube 2 (two) times daily., Disp: 180 tablet, Rfl: 1   bisacodyl (DULCOLAX) 5 MG EC tablet, Take 5 mg by mouth daily as needed for moderate constipation., Disp: , Rfl:    carvedilol (COREG) 3.125 MG tablet, Place 1 tablet (3.125 mg total) into feeding tube 2 (two) times daily with a meal., Disp: 180 tablet, Rfl: 1   insulin aspart (NOVOLOG) 100 UNIT/ML injection, 0-9 Units, Subcutaneous, 3 times daily with meals, CBG < 70: Implement Hypoglycemia measures CBG 70 - 120: 0 units CBG 121 - 150: 1 unit CBG 151 - 200: 2 units CBG 201 - 250: 3 units CBG 251 - 300: 5 units CBG 301 - 350: 7 units CBG 351 - 400: 9 units CBG > 400: call MD, Disp: 10 mL, Rfl: 11   insulin glargine (LANTUS) 100 UNIT/ML injection, Inject 0.16 mLs (16 Units total) into the skin at bedtime., Disp: 10 mL, Rfl: 11   Insulin Pen Needle 32G X 4 MM MISC, Use as directed, Disp: 200 each, Rfl: 3   multivitamin (RENA-VIT) TABS tablet, Take 1 tablet by mouth at bedtime., Disp: , Rfl: 0   Nutritional Supplements (FEEDING SUPPLEMENT, NEPRO CARB STEADY,) LIQD, Allow Nepro @ 50 ml/hr to run from 8pm-6am (10 hrs total), Disp: 15000 mL, Rfl: 1   Nutritional Supplements (FEEDING SUPPLEMENT, OSMOLITE 1.5 CAL,) LIQD, Place 237 mLs into feeding tube 3 (three) times daily with meals., Disp: , Rfl: 0   polyethylene glycol (MIRALAX) 17 g packet, Place 17 g into feeding tube daily., Disp: 14 each, Rfl: 0   Protein (FEEDING SUPPLEMENT, PROSOURCE TF20,) liquid, Place 60 mLs into feeding tube daily., Disp: 1800 mL, Rfl: 0   Water For Irrigation, Sterile (FREE WATER) SOLN, Place 100 mLs into feeding tube 4 (four) times daily., Disp: 12000 mL, Rfl: 0  Observations/Objective: Patient is well-developed, well-nourished in no acute distress.  Resting comfortably  at home.  Head is normocephalic, atraumatic.  No labored breathing.  Speech  is clear and coherent with logical content.  Daughter did all the talking, anxious   Assessment and Plan: 1. Type 2 diabetes mellitus without complication, with long-term current use of insulin (HCC)  2. G tube feedings (HCC)  Continue Novolog sliding scale. Discussed sliding scale.  Continue Lantus 16 units at night With new feeding may need to increase her Lantus, will let PCP address this.  Education given about insulin and diabetes. Care giver states she understands.   Follow Up Instructions: I discussed the assessment and treatment plan with the patient. The patient was provided an opportunity to ask questions and all were answered. The patient agreed with the plan and demonstrated an understanding of the instructions.  A copy of instructions were sent to the patient via MyChart unless otherwise noted below.    The patient was advised to call back or seek an in-person evaluation if the symptoms worsen or if the condition fails to improve as anticipated.  Time:  I spent 17  minutes with the patient via telehealth technology discussing the above problems/concerns.    [name], FNP

## 2023-04-19 ENCOUNTER — Telehealth: Payer: Self-pay | Admitting: Internal Medicine

## 2023-04-19 MED ORDER — INSULIN PEN NEEDLE 32G X 4 MM MISC: 3 refills | Status: DC

## 2023-04-19 NOTE — Telephone Encounter (Signed)
Use to inject insulin 4 times a day for blood sugar management.

## 2023-04-19 NOTE — Addendum Note (Signed)
Addended by: Mary Sella D on: 04/19/2023 10:03 AM   Modules accepted: Orders

## 2023-04-19 NOTE — Telephone Encounter (Signed)
Samantha [name] [phone] want verbal orders for the pt   1 time for 5 weeks

## 2023-04-19 NOTE — Telephone Encounter (Signed)
Please advise 

## 2023-04-19 NOTE — Telephone Encounter (Signed)
[name] was called to get clarification on verbal orders- Home health Pt 1x 5 weeks was ok'd by Salvatore Decent, FNP

## 2023-04-19 NOTE — Telephone Encounter (Signed)
What kind of orders?

## 2023-04-19 NOTE — Telephone Encounter (Signed)
Rx sent to the pharmacy.

## 2023-04-20 ENCOUNTER — Ambulatory Visit: Payer: Medicare HMO | Admitting: Internal Medicine

## 2023-04-20 ENCOUNTER — Encounter: Payer: Self-pay | Admitting: Internal Medicine

## 2023-04-20 VITALS — BP 104/64 | HR 91 | Temp 98.1°F

## 2023-04-20 DIAGNOSIS — K5900 Constipation, unspecified: Secondary | ICD-10-CM | POA: Diagnosis not present

## 2023-04-20 DIAGNOSIS — R6510 Systemic inflammatory response syndrome (SIRS) of non-infectious origin without acute organ dysfunction: Secondary | ICD-10-CM

## 2023-04-20 DIAGNOSIS — R627 Adult failure to thrive: Secondary | ICD-10-CM

## 2023-04-20 DIAGNOSIS — I10 Essential (primary) hypertension: Secondary | ICD-10-CM

## 2023-04-20 DIAGNOSIS — R509 Fever, unspecified: Secondary | ICD-10-CM

## 2023-04-20 DIAGNOSIS — E119 Type 2 diabetes mellitus without complications: Secondary | ICD-10-CM

## 2023-04-20 DIAGNOSIS — K811 Chronic cholecystitis: Secondary | ICD-10-CM | POA: Diagnosis not present

## 2023-04-20 DIAGNOSIS — N186 End stage renal disease: Secondary | ICD-10-CM

## 2023-04-20 DIAGNOSIS — Z8673 Personal history of transient ischemic attack (TIA), and cerebral infarction without residual deficits: Secondary | ICD-10-CM

## 2023-04-20 DIAGNOSIS — Z992 Dependence on renal dialysis: Secondary | ICD-10-CM

## 2023-04-20 DIAGNOSIS — Z794 Long term (current) use of insulin: Secondary | ICD-10-CM

## 2023-04-20 DIAGNOSIS — I6350 Cerebral infarction due to unspecified occlusion or stenosis of unspecified cerebral artery: Secondary | ICD-10-CM

## 2023-04-20 DIAGNOSIS — Z931 Gastrostomy status: Secondary | ICD-10-CM

## 2023-04-20 MED ORDER — DEXCOM G7 RECEIVER DEVI: 0 refills | Status: DC

## 2023-04-20 MED ORDER — DEXCOM G7 SENSOR MISC: 3 refills | Status: DC

## 2023-04-20 MED ORDER — MEGESTROL ACETATE 400 MG/10ML PO SUSP: 800.0000 mg | Freq: Every day | ORAL | 2 refills | Status: DC

## 2023-04-20 MED ORDER — SENNOSIDES 8.6 MG PO TABS: ORAL_TABLET | ORAL | 1 refills | Status: DC

## 2023-04-20 NOTE — Progress Notes (Signed)
[facility] [address] [place] [address] Dept: [phone] Dept Fax: [phone]    Subjective:   Samantha [name] 1945-12-14 04/20/2023  Chief Complaint  Patient presents with   Hospitalization Follow-up    HPI: HOSPITAL FOLLOW UP:  Samantha [name] is a 77 yo F with PMHx of ESRD on dialysis, HTN, CVA with left hemiparesis, Alzheimer's, T2DM, chronic cholecystitis with cholecystostomy tube, and failure to thrive with g-tube feedings, presents for hospital follow up.  Patient presented to ER with fever and leukocytosis with no clear source.  CT abdomen/pelvis with no acute findings.  CT chest showed a moderate right pleural effusion, trace left effusion.  Patient underwent a thoracentesis, no bacterial growth from fluid sample.  UA was unremarkable, negative blood cultures, negative COVID/flu/RSV.  Patient was given antibiotics during hospitalization and sent home on cefdinir 300 mg once daily for 3 days.  Hospital/Facility: [facility]  Discharge Diagnosis: Fever/SIRS Admission Date: 03/30/23 Discharge Date: 04/06/23 TCM call Date: Attempted 3 times, unsuccessful Referrals: none. Patient has follow-up with general surgery on 06/02/2023.  Daughter does have concerns about patient's constipation.  Patient does not have as frequent bowel movements due to decreased oral intake.  Patient was given bisacodyl for constipation relief, but daughter reports that it is not helpful and patient still suffers with constipation.  Patient has been on senna kit in the past and did well.   Daughter is also concerned about the patient's heart rate.  Patient is on carvedilol 3.125 mg twice daily.  Daughter states that the patient's heart rate fluctuates from 80-110bpm.  Recently they called EMS who notified and educated family on heart rate and when to be concerned.  Patient's blood pressure is between 120/80s to 130/80s consistently.   Daughter  also reports concerns with patient's blood sugar.  She states that the patient receives Nepro tube feeding at 50 cc/h from 8 PM to 6 AM every night.  This has increased patient's a.m. blood sugar.  Morning blood sugars are averaging 250-300 or above.  Blood sugar does come down throughout the day with sliding scale insulin.  Patient is currently taking Lantus 16 units at bedtime.  Daughter is also concerned about patient's appetite.  She is wondering if we can start an appetite stimulant, as patient has been on this in the past.   The following portions of the patient's history were reviewed and updated as appropriate: past medical history, past surgical history, family history, social history, allergies, medications, and problem list.   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   Epigastric pain 12/13/2022   End-stage renal disease on hemodialysis (HCC) 12/13/2022   Failure to thrive in adult 12/13/2022   DM2 (diabetes mellitus, type 2) (HCC) 12/13/2022   Essential hypertension 12/13/2022   History of DVT (deep vein thrombosis) 12/13/2022   Acute cholecystitis 12/12/2022   Diabetic peripheral neuropathy (HCC) 07/12/2019   Alzheimer's disease (HCC) 07/12/2019   Cerebrovascular accident (CVA) due to occlusion of cerebral artery (HCC) 07/12/2019   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   Dementia (HCC)    Diabetes mellitus without complication (HCC)    ESRD (end stage renal disease) on  dialysis (HCC)    Heart failure (HCC)    HTN (hypertension)    PEG (percutaneous endoscopic gastrostomy) status (HCC)    Thyroid disease    Past Surgical History:  Procedure Laterality  Date   ABDOMINAL HYSTERECTOMY     IR EXCHANGE BILIARY DRAIN  02/11/2023   IR EXCHANGE BILIARY DRAIN  04/13/2023   IR PERC CHOLECYSTOSTOMY  12/16/2022   No family history on file.  Current Outpatient Medications:    acetaminophen (TYLENOL) 500 MG tablet, Place 500 mg into feeding tube every 6 (six) hours as needed for mild pain., Disp: , Rfl:    apixaban (ELIQUIS) 5 MG TABS tablet, Place 1 tablet (5 mg total) into feeding tube 2 (two) times daily., Disp: 180 tablet, Rfl: 1   bisacodyl (DULCOLAX) 5 MG EC tablet, Take 5 mg by mouth daily as needed for moderate constipation., Disp: , Rfl:    carvedilol (COREG) 3.125 MG tablet, Place 1 tablet (3.125 mg total) into feeding tube 2 (two) times daily with a meal., Disp: 180 tablet, Rfl: 1   insulin aspart (NOVOLOG) 100 UNIT/ML injection, 0-9 Units, Subcutaneous, 3 times daily with meals, CBG < 70: Implement Hypoglycemia measures CBG 70 - 120: 0 units CBG 121 - 150: 1 unit CBG 151 - 200: 2 units CBG 201 - 250: 3 units CBG 251 - 300: 5 units CBG 301 - 350: 7 units CBG 351 - 400: 9 units CBG > 400: call MD, Disp: 10 mL, Rfl: 11   insulin glargine (LANTUS) 100 UNIT/ML injection, Inject 0.16 mLs (16 Units total) into the skin at bedtime., Disp: 10 mL, Rfl: 11   Insulin Pen Needle 32G X 4 MM MISC, Use to inject insulin 4 times a day for blood sugar management., Disp: 200 each, Rfl: 3   multivitamin (RENA-VIT) TABS tablet, Take 1 tablet by mouth at bedtime., Disp: , Rfl: 0   Nutritional Supplements (FEEDING SUPPLEMENT, OSMOLITE 1.5 CAL,) LIQD, Place 237 mLs into feeding tube 3 (three) times daily with meals., Disp: , Rfl: 0   polyethylene glycol (MIRALAX) 17 g packet, Place 17 g into feeding tube daily., Disp: 14 each, Rfl: 0   Protein (FEEDING SUPPLEMENT, PROSOURCE TF20,) liquid, Place 60 mLs into feeding tube daily., Disp: 1800 mL, Rfl: 0   Water For Irrigation, Sterile (FREE WATER) SOLN, Place 100 mLs into feeding tube 4 (four) times daily., Disp: 12000 mL, Rfl:  0   Nutritional Supplements (FEEDING SUPPLEMENT, NEPRO CARB STEADY,) LIQD, Allow Nepro @ 50 ml/hr to run from 8pm-6am (10 hrs total), Disp: 15000 mL, Rfl: 1 Allergies  Allergen Reactions   Aspirin Anaphylaxis   Penicillins Anaphylaxis    **Tolerates cephalosporins     ROS: A complete ROS was performed with pertinent positives/negatives noted in the HPI. The remainder of the ROS are negative.    Objective:   Today's Vitals   04/20/23 1419  BP: 104/64  Pulse: 91  Temp: 98.1 F (36.7 C)  TempSrc: Temporal  SpO2: 99%   GENERAL: Well-appearing, in NAD. SKIN: Pink, warm and dry.  NECK: No lymphadenopathy.  RESPIRATORY: Chest wall symmetrical - HD port to R. Upper chest wall. Respirations even and non-labored. Breath sounds clear to auscultation bilaterally.  CARDIAC: S1, S2 present, regular rate and rhythm. Peripheral pulses 2+ bilaterally.  GI: Abdomen soft, non-tender. Normoactive bowel sounds. G. Tube to R. Mid abdomen. Cholecystostomy drain to RUQ EXTREMITIES: thin, without clubbing, cyanosis, or edema.  NEUROLOGIC: wheelchair bound. Left sided hemiparesis. PSYCH/MENTAL STATUS: Alert. Cooperative,  appropriate mood and affect.   Health Maintenance Due  Topic Date Due   Medicare Annual Wellness (AWV)  Never done   Pneumonia Vaccine 42+ Years old (1 of 2 - PCV) Never done   FOOT EXAM  Never done   OPHTHALMOLOGY EXAM  Never done   Hepatitis C Screening  Never done   DTaP/Tdap/Td (1 - Tdap) Never done   Zoster Vaccines- Shingrix (1 of 2) Never done   DEXA SCAN  Never done   INFLUENZA VACCINE  03/25/2023    No results found for any visits on 04/20/23.  The ASCVD Risk score (Arnett DK, et al., 2019) failed to calculate for the following reasons:   The patient has a prior MI or stroke diagnosis     Assessment & Plan:  1. Fever, unspecified fever cause - Comp Met (CMET) - CBC w/Diff - resolved   2. SIRS (systemic inflammatory response syndrome) (HCC) - Comp Met  (CMET) - CBC w/Diff  3. Constipation, unspecified constipation type - senna (SENOKOT) 8.6 MG tablet; Administer 2 tablets into g-tube once daily  Dispense: 180 tablet; Refill: 1 -Discontinue bisacodyl -MiraLAX as needed  4. Chronic cholecystitis -Continue follow-up with general surgery and interventional radiology  5. End-stage renal disease on hemodialysis (HCC) -Continue follow-up with nephrology and scheduled dialysis treatments  6. Type 2 diabetes mellitus without complication, with long-term current use of insulin (HCC) -Increase Lantus by 2 units every 2 days for fasting morning blood sugar above 200.  If fasting blood sugar is between 150-200 keep with current Lantus dose.  If fasting blood sugar is below 150 decrease by 2 units every 2 days until fasting blood sugar is between 150-200. -Continue sliding scale insulin   7. Essential hypertension -Continue carvedilol 3.125 mg twice daily  8. Cerebrovascular accident (CVA) due to occlusion of cerebral artery (HCC) -Continue home health care with physical and Occupational Therapy.  9. G tube feedings (HCC) -Continue Nepro feedings at 50 cc/h  10. Failure to thrive in adult - megestrol (MEGACE) 400 MG/10ML suspension; Place 20 mLs (800 mg total) into feeding tube daily.  Dispense: 600 mL; Refill: 2   No orders of the defined types were placed in this encounter.  No images are attached to the encounter or orders placed in the encounter. No orders of the defined types were placed in this encounter.   Return for Scheduled Routine Office Visits and as needed.   Salvatore Decent, FNP

## 2023-04-20 NOTE — Patient Instructions (Addendum)
HEART RATE:  Continue Carvedilol dose. Monitor heart rate. Okay if is 80-110. Continue to monitor blood pressure. If BP is above 140/90 consistently and does not come down, then notify PCP or cardiologist.   CONSTIPATION:  Starting Senokot once daily  Stop bisacodyl  Miralax as needed   BLOOD SUGAR:  Increase Lantus by 2 units every 2 days for fasting blood sugar above 200. If fasting blood sugar is less than 200, keep with current Lantus dose. If fasting blood sugar is below 150, decrease by 2 units every 2 days until fasting blood sugar is between 150-200.

## 2023-04-20 NOTE — Telephone Encounter (Signed)
Patient was seen today in office,  CAPS paperwork filled out and faxed

## 2023-04-21 NOTE — Telephone Encounter (Signed)
Patient was seen in office on yesterday to discuss concerns

## 2023-04-22 ENCOUNTER — Telehealth: Payer: Self-pay | Admitting: Internal Medicine

## 2023-04-22 NOTE — Telephone Encounter (Signed)
Returned call to [place] and she would like to know what nutritional supplement order will be placed Osmolite or Naprolite,  she would also like to know if patient qualifies for an insulin pump.  The daughter was also checking on the status of Dexcom

## 2023-04-22 NOTE — Telephone Encounter (Signed)
Please give pt daughter a call Health visitor [phone] )

## 2023-04-23 NOTE — Telephone Encounter (Signed)
The nutritional supplement will be done through GI nutrition. Samantha [name] was supposed to be working on getting patient in, I had sent her a message about this. Primary care does NOT manage tube feedings)   For the insulin pump, Samantha [name] can qualify if Samantha [name] is giving her self at least 3 or more injections of insulin per day. If they want to pursue this, Samantha [name] will have to see endocrinologist for management of an insulin pump.

## 2023-04-23 NOTE — Telephone Encounter (Signed)
Left voicemail to call the office.

## 2023-04-27 ENCOUNTER — Other Ambulatory Visit: Payer: Self-pay | Admitting: Internal Medicine

## 2023-04-27 ENCOUNTER — Telehealth: Payer: Self-pay | Admitting: Internal Medicine

## 2023-04-27 DIAGNOSIS — Z931 Gastrostomy status: Secondary | ICD-10-CM

## 2023-04-27 DIAGNOSIS — I6350 Cerebral infarction due to unspecified occlusion or stenosis of unspecified cerebral artery: Secondary | ICD-10-CM

## 2023-04-27 DIAGNOSIS — R627 Adult failure to thrive: Secondary | ICD-10-CM

## 2023-04-27 DIAGNOSIS — Z86718 Personal history of other venous thrombosis and embolism: Secondary | ICD-10-CM

## 2023-04-27 MED ORDER — NEPRO/CARBSTEADY PO LIQD
ORAL | 1 refills | Status: DC
Start: 2023-04-27 — End: 2023-09-25

## 2023-04-27 MED ORDER — APIXABAN 5 MG PO TABS
5.0000 mg | ORAL_TABLET | Freq: Two times a day (BID) | ORAL | 1 refills | Status: DC
Start: 2023-04-27 — End: 2023-11-24

## 2023-04-27 NOTE — Telephone Encounter (Signed)
Caller Name: [name], daughter Call back phone #: [phone]  Reason for Call:  Eliquis RX was not received by pharmacy. Please resend: [facility] [address], [place] - [address] Phone: [phone]  Fax: [phone]     [facility] advised [facility] that they need more information/call from office regarding Dexcom sensors. They need more information in order to file to Medicaid.  Needs RX sent to [facility] for purple syringes that have the child lock for g-tube feedings.  Need RX for Nepro resent to [facility] too.

## 2023-04-27 NOTE — Telephone Encounter (Signed)
Pt's daughter, [name] checking on status of this message

## 2023-04-27 NOTE — Telephone Encounter (Signed)
Verbal orders given  

## 2023-04-27 NOTE — Telephone Encounter (Signed)
Home Health verbal orders Caller Name:Alden Agency Name: [facility] Callback number: [phone] Requesting OT/PT/Skilled nursing/Social Work/Speech: Speech   Reason:dysphagia  Frequency:1x/4  Please forward to [facility] pool or providers CMA

## 2023-04-27 NOTE — Telephone Encounter (Signed)
Is it okay to give verbal orders?  

## 2023-04-27 NOTE — Telephone Encounter (Signed)
Yes okay for verbal orders

## 2023-04-27 NOTE — Telephone Encounter (Signed)
Eliquis sent to pharmacy.   Printed Rx for Nepro and syringes faxed over to [facility].   Patient's medicare should cover the Dexcom. She is doing at least 3 injections of insulin for type 2 diabetes each day, which would qualify her for the dexcom.    This patient needs complex medical services. Will need to transfer over to [facility]. Samantha [name], can we set up a telephone call with the patient's family to discuss this?

## 2023-04-28 NOTE — Telephone Encounter (Signed)
Orders were faxed yesterday evening and placed on CMA's desk.

## 2023-04-28 NOTE — Telephone Encounter (Signed)
Pt daughter [name] stated that the pharmacy told her this needs a PA Continuous Glucose Sensor (DEXCOM G7 SENSOR) MISC [[id number]]   Please call [name] [phone]

## 2023-04-29 ENCOUNTER — Telehealth: Payer: Self-pay

## 2023-04-29 ENCOUNTER — Other Ambulatory Visit (HOSPITAL_COMMUNITY): Payer: Self-pay

## 2023-04-29 NOTE — Telephone Encounter (Signed)
Pharmacy Patient Advocate Encounter  Received notification from [facility] that Prior Authorization for Dexcom G7 sensors has been APPROVED from 04/20/23 to 08/24/23   PA #/Case ID/Reference #: [id number]  Per test claim: Refill too soon - last filled 04/22/23 next fill 07/06/23

## 2023-04-29 NOTE — Telephone Encounter (Signed)
Left voicemail informing daughter G7 sensor was approved.

## 2023-04-30 ENCOUNTER — Telehealth: Payer: Self-pay | Admitting: Internal Medicine

## 2023-04-30 NOTE — Telephone Encounter (Signed)
Pt's daughter, [name] is stating her mom is still having trouble getting her feeding supplies. Please advise [name] at [phone] [facility])

## 2023-04-30 NOTE — Telephone Encounter (Signed)
Spoke with [facility] pharmacy staff and was told  Dexcom G7 sensors will cost patient $400 out of pocket [employer] -approved but Medicare - rejected.  Daughter of patient would like to get resolved. Please advise

## 2023-04-30 NOTE — Telephone Encounter (Signed)
Pt's daughter, [name] is requesting her mom get a script for a hosp bed. The one she has now, her insurance is going to take back. [facility], order this through the parachute portal or fax to #[phone]

## 2023-04-30 NOTE — Telephone Encounter (Signed)
9.6.24 - Pt called saying she was returning the nurse' call. She wants a call back at [phone] per her lab results.

## 2023-04-30 NOTE — Telephone Encounter (Signed)
Please advise 

## 2023-05-01 ENCOUNTER — Other Ambulatory Visit: Payer: Self-pay | Admitting: Internal Medicine

## 2023-05-01 DIAGNOSIS — R627 Adult failure to thrive: Secondary | ICD-10-CM

## 2023-05-01 DIAGNOSIS — Z931 Gastrostomy status: Secondary | ICD-10-CM

## 2023-05-01 NOTE — Telephone Encounter (Signed)
Ohne call from on call service that feeding supplement not received and agency said didn't receive  [facility].  But reported as sent in record . Service has a callinto [facility]  and my ok to give voic order to get her enough to get through to next week and contact  office when open  Hopefully will get response from agency .  Otherwise can let me know if a apharmacy can receive an interim  rx

## 2023-05-03 ENCOUNTER — Telehealth: Payer: Self-pay | Admitting: Internal Medicine

## 2023-05-03 ENCOUNTER — Telehealth: Payer: Self-pay

## 2023-05-03 ENCOUNTER — Other Ambulatory Visit (HOSPITAL_COMMUNITY): Payer: Self-pay

## 2023-05-03 ENCOUNTER — Telehealth: Payer: Medicare HMO | Admitting: Internal Medicine

## 2023-05-03 ENCOUNTER — Other Ambulatory Visit: Payer: Self-pay

## 2023-05-03 DIAGNOSIS — R627 Adult failure to thrive: Secondary | ICD-10-CM

## 2023-05-03 DIAGNOSIS — G8194 Hemiplegia, unspecified affecting left nondominant side: Secondary | ICD-10-CM

## 2023-05-03 DIAGNOSIS — I639 Cerebral infarction, unspecified: Secondary | ICD-10-CM

## 2023-05-03 NOTE — Telephone Encounter (Signed)
Rx for feeding supplements were resubmitted by fax.

## 2023-05-03 NOTE — Telephone Encounter (Signed)
Pts daughter said they received a letter. She would like a call to help her understand what it is all about .

## 2023-05-03 NOTE — Telephone Encounter (Signed)
Routed to S [address]. More info needed for Nepro order.

## 2023-05-03 NOTE — Telephone Encounter (Signed)
Rx sent 

## 2023-05-03 NOTE — Telephone Encounter (Signed)
Daughter of patient informed of message

## 2023-05-03 NOTE — Telephone Encounter (Signed)
Clinical notes faxed to [facility]

## 2023-05-05 ENCOUNTER — Ambulatory Visit: Payer: Medicare HMO | Admitting: Internal Medicine

## 2023-05-05 NOTE — Telephone Encounter (Signed)
Noted  

## 2023-05-05 NOTE — Telephone Encounter (Signed)
The letter was addressed and corrected resubmitted by fax

## 2023-05-05 NOTE — Telephone Encounter (Signed)
PCP has telephone call w/ patient's children this afternoon and question about letter can be addressed during call.

## 2023-05-06 ENCOUNTER — Telehealth: Payer: Self-pay | Admitting: Internal Medicine

## 2023-05-06 ENCOUNTER — Other Ambulatory Visit (HOSPITAL_COMMUNITY): Payer: Self-pay

## 2023-05-06 NOTE — Telephone Encounter (Signed)
Can you speak with daughter about this

## 2023-05-06 NOTE — Telephone Encounter (Signed)
Daughter of patient informed of message and will provider Medicare care to the pharmacy

## 2023-05-06 NOTE — Telephone Encounter (Signed)
Pt daughter called and said she made several trips to the pharmacy to pick her mom medication up and she said the pharmacy is still waiting on a prior authorization to be filled out . Please call the pt daughter Continuous Glucose Receiver [facility] G7 RECEIVER) [place] [[phone]]

## 2023-05-06 NOTE — Telephone Encounter (Signed)
Placed a call to [facility] to follow up on the forms that were to be faxed over. I spoke with a representative and she stated the patient is a dual eligible patient and she should have a $0 copay for her testing supplies under Medicare Part B.   Called [facility] to see if they could process under Part B and they did not have her Medicare Part B card on file and could not process it. I did not see one in the media tab. Could the family please provide to the pharmacy to process for the patient? Thanks.

## 2023-05-07 NOTE — Telephone Encounter (Signed)
Spoke with daughter and she will take the Medicare part B care to the pharmacy for processing for the Dexcom sensors.

## 2023-05-07 NOTE — Telephone Encounter (Signed)
[facility] is calling needing more documentation for pt needing hosp bed. Need to put in note that pt needs hosp bed to position her body that a regular bed can not do. Her head needs to be elevated more than 30 degrees because of her CHF. [facility]'s [phone] if any further questions.

## 2023-05-07 NOTE — Telephone Encounter (Signed)
Rx for hospital bed was sent to CAP and awaiting response for approval

## 2023-05-07 NOTE — Telephone Encounter (Signed)
I called and left a voicemail for Samantha [name] to call me back

## 2023-05-10 NOTE — Telephone Encounter (Signed)
Pts daughter stated they have still not received the  Continuous Glucose Sensor (DEXCOM G7 SENSOR) MISC [[id number]]  I recommended that she try the insurance company she agreed.

## 2023-05-11 ENCOUNTER — Telehealth: Payer: Self-pay | Admitting: Internal Medicine

## 2023-05-11 NOTE — Telephone Encounter (Signed)
Pts daughter Tempie Hoist [phone]  She returned your call about dexcom.  She also needs to talk about the dementia not being in her chart.

## 2023-05-11 NOTE — Telephone Encounter (Signed)
[facility] [phone] called and need some clarification on the dexcom sensor that need to be prescribed .

## 2023-05-11 NOTE — Telephone Encounter (Signed)
Spoke with pharmacist and was told an PA needs to be submitted to Medicaid for Continuous Glucose Sensor (DEXCOM G7 SENSOR) MISC

## 2023-05-11 NOTE — Telephone Encounter (Signed)
Returned call to [employer] to inform her of the update on the Dexcom sensor and sending the message to PA  to get an approval from [place],  even though it had dual approval from other insurances.  While talking with her she stated the patient had a home visit from Medicare and asked if Dementia could be added to problem list.

## 2023-05-11 NOTE — Telephone Encounter (Signed)
Noted  

## 2023-05-12 ENCOUNTER — Encounter: Payer: Self-pay | Admitting: Internal Medicine

## 2023-05-12 ENCOUNTER — Other Ambulatory Visit (HOSPITAL_COMMUNITY): Payer: Self-pay

## 2023-05-12 NOTE — Telephone Encounter (Signed)
Chart updated with Dementia d/t Alzheimer's

## 2023-05-12 NOTE — Telephone Encounter (Signed)
Noted  

## 2023-05-14 ENCOUNTER — Telehealth: Payer: Self-pay

## 2023-05-14 ENCOUNTER — Telehealth: Payer: Self-pay | Admitting: Internal Medicine

## 2023-05-14 NOTE — Telephone Encounter (Signed)
Please advise 

## 2023-05-14 NOTE — Telephone Encounter (Signed)
Home Health verbal orders Caller Name:Jim Agency Name: [facility] Callback number: [phone] secure vm  Requesting OT/PT/Skilled nursing/Social Work/Speech:PT and OT  Reason:ROM concerns, weakness and fatigue   Frequency:1x/8  Please forward to [facility] pool or providers CMA

## 2023-05-14 NOTE — Telephone Encounter (Signed)
HH ORDERS   Caller Name: [facility] Agency Name: [facility] Callback Phone #: [phone] - secure vm  Service Requested: Requesting verbal approval for Home health PT/re-certification.  Frequency of Visits: 1 X a week for 9 weeks

## 2023-05-14 NOTE — Telephone Encounter (Signed)
This nurse called patient to inform her that I said emailed the link for our AWV. Patient's daughter states that Ms. Menken is now going to [facility].

## 2023-05-17 NOTE — Telephone Encounter (Signed)
Left Rosanne Ashing a detailed message informing Vo are approved by Morrie Sheldon

## 2023-05-17 NOTE — Telephone Encounter (Signed)
Left detailed voicemail informing Gracie Vo are approved by Morrie Sheldon

## 2023-05-17 NOTE — Telephone Encounter (Signed)
Please advise 

## 2023-05-20 DIAGNOSIS — Z931 Gastrostomy status: Secondary | ICD-10-CM | POA: Insufficient documentation

## 2023-05-20 DIAGNOSIS — N186 End stage renal disease: Secondary | ICD-10-CM | POA: Insufficient documentation

## 2023-05-25 ENCOUNTER — Encounter: Payer: Self-pay | Admitting: Sports Medicine

## 2023-05-25 ENCOUNTER — Ambulatory Visit (INDEPENDENT_AMBULATORY_CARE_PROVIDER_SITE_OTHER): Payer: Medicare HMO | Admitting: Sports Medicine

## 2023-05-25 VITALS — BP 118/80 | HR 86 | Temp 97.3°F | Resp 17 | Ht 59.0 in | Wt 128.7 lb

## 2023-05-25 DIAGNOSIS — Z992 Dependence on renal dialysis: Secondary | ICD-10-CM

## 2023-05-25 DIAGNOSIS — E1122 Type 2 diabetes mellitus with diabetic chronic kidney disease: Secondary | ICD-10-CM

## 2023-05-25 DIAGNOSIS — I222 Subsequent non-ST elevation (NSTEMI) myocardial infarction: Secondary | ICD-10-CM | POA: Diagnosis not present

## 2023-05-25 DIAGNOSIS — I6350 Cerebral infarction due to unspecified occlusion or stenosis of unspecified cerebral artery: Secondary | ICD-10-CM | POA: Diagnosis not present

## 2023-05-25 DIAGNOSIS — F0280 Dementia in other diseases classified elsewhere without behavioral disturbance: Secondary | ICD-10-CM | POA: Diagnosis not present

## 2023-05-25 DIAGNOSIS — Z931 Gastrostomy status: Secondary | ICD-10-CM

## 2023-05-25 DIAGNOSIS — G309 Alzheimer's disease, unspecified: Secondary | ICD-10-CM

## 2023-05-25 DIAGNOSIS — Z794 Long term (current) use of insulin: Secondary | ICD-10-CM

## 2023-05-25 DIAGNOSIS — E7849 Other hyperlipidemia: Secondary | ICD-10-CM | POA: Diagnosis not present

## 2023-05-25 DIAGNOSIS — I10 Essential (primary) hypertension: Secondary | ICD-10-CM

## 2023-05-25 DIAGNOSIS — N186 End stage renal disease: Secondary | ICD-10-CM | POA: Diagnosis not present

## 2023-05-25 DIAGNOSIS — R1312 Dysphagia, oropharyngeal phase: Secondary | ICD-10-CM | POA: Diagnosis not present

## 2023-05-25 NOTE — Progress Notes (Signed)
Careteam: Patient Care Team: Salvatore Decent, FNP as PCP - General (Internal Medicine)  PLACE OF SERVICE:  [facility]  Advanced Directive information Does Patient Have a Medical Advance Directive?: Yes, Type of Advance Directive: Living will;Out of facility DNR (pink MOST or yellow form), Does patient want to make changes to medical advance directive?: No - Patient declined  Allergies  Allergen Reactions   Aspirin Anaphylaxis   Penicillins Anaphylaxis    **Tolerates cephalosporins    Chief Complaint  Patient presents with   Establish Care    New Patient.      HPI: Patient is a 77 y.o. female  is here to establish care Relocated  from [place] in April.  She stayed in [place] from May until July  Pt lives with her daughter currently  Pt has h/o CVA, ESRD, DVT, Dementia  ESRD -  since 2021  M, W, F Follows with nephrology   CVA - multiple  strokes  2018, 2021   Tube feeding-    Daughter states that pt has PEG tube since 2021 after the stroke due to poor PO intake  Pt takes her medicines orally and some meals but consumes ess than 50% orally,   tube feedings through out of the night  No coughing or chocking while eating  Previously managed by GI in [place], daughter requesting to see GI Nutritionist at dialysis center managing her currently  Pt is on megace for weight gain which was prescribed by her previous provider   DM  Follows with endocrinology  On sliding scale , lantus  Waiting for dexcom   H/o DVT  On eliquis  Daughter states pt is on eliquis  since January   Dementia  Bed bound and wheel chair dependent  She has an aid for couple of hours mon - Friday  Has some difficulty finding words  Sleeps during the day  Pt knows her name, can recognize her daughter  Not oriented to time or place and does not remember what she had for breakfast    Review of Systems:  Review of Systems  Unable to perform ROS: Dementia  Constitutional:  Negative for  chills and fever.  Respiratory:  Negative for shortness of breath.   Gastrointestinal:  Negative for blood in stool and vomiting.  Genitourinary:  Negative for hematuria.  Psychiatric/Behavioral:  Positive for memory loss.      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   Dementia (HCC)    Diabetes mellitus without complication (HCC)    ESRD (end stage renal disease) on dialysis (HCC)    Heart failure (HCC)    HTN (hypertension)    PEG (percutaneous endoscopic gastrostomy) status (HCC)    Thyroid disease    Past Surgical History:  Procedure Laterality Date   ABDOMINAL HYSTERECTOMY     IR EXCHANGE BILIARY DRAIN  02/11/2023   IR EXCHANGE BILIARY DRAIN  04/13/2023   IR PERC CHOLECYSTOSTOMY  12/16/2022   Social History:   reports that she has never smoked. She does not have any smokeless tobacco history on file. She reports that she does not currently use alcohol. She reports that she does not use drugs.  History reviewed. No pertinent family history.  Medications: Patient's Medications  New Prescriptions   No medications on file  Previous Medications   ACETAMINOPHEN (TYLENOL) 500 MG TABLET    Place 500 mg into feeding tube every  6 (six) hours as needed for mild pain.   APIXABAN (ELIQUIS) 5 MG TABS TABLET    Place 1 tablet (5 mg total) into feeding tube 2 (two) times daily.   CARVEDILOL (COREG) 3.125 MG TABLET    Place 1 tablet (3.125 mg total) into feeding tube 2 (two) times daily with a meal.   CONTINUOUS GLUCOSE RECEIVER (DEXCOM G7 RECEIVER) DEVI    Check blood sugar before meals and at bedtime   CONTINUOUS GLUCOSE SENSOR (DEXCOM G7 SENSOR) MISC    Apply to back of arm. Change sensor every 10 days.   GLUCAGON (BAQSIMI ONE PACK) 3 MG/DOSE POWD    Place 1 packet into the nose as needed.   INSULIN ASPART (NOVOLOG) 100 UNIT/ML INJECTION    0-9 Units, Subcutaneous, 3 times daily with meals, CBG < 70: Implement  Hypoglycemia measures CBG 70 - 120: 0 units CBG 121 - 150: 1 unit CBG 151 - 200: 2 units CBG 201 - 250: 3 units CBG 251 - 300: 5 units CBG 301 - 350: 7 units CBG 351 - 400: 9 units CBG > 400: call MD   INSULIN GLARGINE (LANTUS) 100 UNIT/ML INJECTION    Inject 0.16 mLs (16 Units total) into the skin at bedtime.   INSULIN PEN NEEDLE 32G X 4 MM MISC    Use to inject insulin 4 times a day for blood sugar management.   MEGESTROL (MEGACE) 400 MG/10ML SUSPENSION    Place 20 mLs (800 mg total) into feeding tube daily.   MULTIVITAMIN (RENA-VIT) TABS TABLET    Take 1 tablet by mouth at bedtime.   NUTRITIONAL SUPPLEMENTS (FEEDING SUPPLEMENT, NEPRO CARB STEADY,) LIQD    Allow Nepro @ 50 ml/hr to run from 8pm-6am (10 hrs total)   POLYETHYLENE GLYCOL (MIRALAX) 17 G PACKET    Place 17 g into feeding tube daily.   SENNA (SENOKOT) 8.6 MG TABLET    Administer 2 tablets into g-tube once daily  Modified Medications   No medications on file  Discontinued Medications   No medications on file    Physical Exam:  Vitals:   05/25/23 0956  BP: 118/80  Pulse: 86  Resp: 17  Temp: (!) 97.3 F (36.3 C)  SpO2: 91%  Weight: 128 lb 12 oz (58.4 kg)  Height: 4\' 11"  (1.499 m)   Body mass index is 26 kg/m. Wt Readings from Last 3 Encounters:  05/25/23 128 lb 12 oz (58.4 kg)  04/07/23 127 lb 3.3 oz (57.7 kg)  03/25/23 130 lb (59 kg)    Physical Exam Constitutional:      Appearance: Normal appearance.  HENT:     Head: Normocephalic and atraumatic.  Cardiovascular:     Rate and Rhythm: Normal rate and regular rhythm.     Heart sounds: No murmur heard. Pulmonary:     Effort: Pulmonary effort is normal. No respiratory distress.     Breath sounds: Normal breath sounds. No wheezing.  Abdominal:     General: Bowel sounds are normal. There is no distension.     Tenderness: There is no abdominal tenderness. There is no guarding or rebound.  Musculoskeletal:        General: No swelling or tenderness.  Skin:     General: Skin is dry.  Neurological:     Mental Status: She is alert. Mental status is at baseline.     Comments: Residual weakness on left side from previous stroke       Labs reviewed: Basic Metabolic Panel:  Recent Labs    03/25/23 1604 03/31/23 0050 04/04/23 0237 04/05/23 0118 04/05/23 0724 04/06/23 0311 04/07/23 0610  NA 138   < > 132* 134* 133* 134* 138  K 4.0   < > 3.5 3.1* 3.2* 3.3* 3.2*  CL 95*   < > 96* 95* 95* 99 101  CO2 35*   < > 27 28 26 29 24   GLUCOSE 97   < > 174* 135* 192* 163* 132*  BUN 14   < > 20 29* 32* 14 25*  CREATININE 2.78*   < > 1.72* 2.30* 2.48* 1.79* 2.48*  CALCIUM 9.5   < > 8.3* 8.4* 8.4* 8.4* 8.2*  MG  --    < > 1.6* 1.6*  --  1.6*  --   PHOS  --    < > 1.7* 3.8 3.8 1.9* 3.4  TSH 2.78  --   --   --   --   --   --    < > = values in this interval not displayed.   Liver Function Tests: Recent Labs    04/03/23 1321 04/03/23 1843 04/04/23 0237 04/04/23 1022 04/05/23 0724 04/06/23 0311 04/07/23 0610  AST 24  --  29  --   --  28  --   ALT 20  --  16  --   --  20  --   ALKPHOS 76  --  69  --   --  68  --   BILITOT 0.3  --  0.4  --   --  0.2*  --   PROT 5.6*  --  5.6* 5.6*  --  5.8*  --   ALBUMIN 2.3*   < > 2.3*  --  2.2* 2.4* 2.2*   < > = values in this interval not displayed.   Recent Labs    02/20/23 0244  LIPASE 53*   No results for input(s): "AMMONIA" in the last 8760 hours. CBC: Recent Labs    04/04/23 0237 04/05/23 0118 04/05/23 0724 04/06/23 0311 04/07/23 0610  WBC 7.5   < > 7.9 8.7 8.6  NEUTROABS 4.5  --   --  5.2 4.9  HGB 9.2*   < > 9.8* 9.6* 9.0*  HCT 29.0*   < > 30.7* 30.0* 28.5*  MCV 100.0   < > 100.3* 96.8 97.9  PLT 148*   < > 175 202 201   < > = values in this interval not displayed.   Lipid Panel: Recent Labs    03/25/23 1604  CHOL 142  HDL 40.40  LDLCALC 70  TRIG 158.0*  CHOLHDL 4   TSH: Recent Labs    03/25/23 1604  TSH 2.78   A1C: Lab Results  Component Value Date   HGBA1C 6.3 03/25/2023      Assessment/Plan  1. Cerebrovascular accident (CVA) due to occlusion of cerebral artery [facility])  Ambulatory referral to Neurology  2. Essential hypertension  At goal  Cont with the same  3. Type 2 diabetes mellitus with chronic kidney disease on chronic dialysis, with long-term current use of insulin (HCC)  A1c 6.9  At goal  Follow up with endo Monitor for hypoglycemia  4. Dementia due to Alzheimer's disease (HCC)  Cont with supportive care Will stop megace due to risk of thrombosis   5. G tube feedings (HCC)   - Ambulatory referral to Gastroenterology  Other orders - Glucagon (BAQSIMI ONE PACK) 3 MG/DOSE POWD; Place 1 packet into the nose as needed.  No follow-ups on file.: 4 months

## 2023-05-26 ENCOUNTER — Telehealth: Payer: Medicare HMO | Admitting: Sports Medicine

## 2023-05-26 DIAGNOSIS — N2581 Secondary hyperparathyroidism of renal origin: Secondary | ICD-10-CM | POA: Diagnosis not present

## 2023-05-26 DIAGNOSIS — N186 End stage renal disease: Secondary | ICD-10-CM | POA: Diagnosis not present

## 2023-05-26 DIAGNOSIS — Z992 Dependence on renal dialysis: Secondary | ICD-10-CM | POA: Diagnosis not present

## 2023-05-26 LAB — BASIC METABOLIC PANEL
BUN: 78 — AB (ref 4–21)
Sodium: 4 — AB (ref 137–147)

## 2023-05-26 LAB — CBC AND DIFFERENTIAL: Hemoglobin: 11.9 — AB (ref 12.0–16.0)

## 2023-05-26 NOTE — Telephone Encounter (Signed)
error 

## 2023-05-28 ENCOUNTER — Other Ambulatory Visit (HOSPITAL_COMMUNITY): Payer: Self-pay | Admitting: Interventional Radiology

## 2023-05-28 DIAGNOSIS — N2581 Secondary hyperparathyroidism of renal origin: Secondary | ICD-10-CM | POA: Diagnosis not present

## 2023-05-28 DIAGNOSIS — N186 End stage renal disease: Secondary | ICD-10-CM

## 2023-05-28 DIAGNOSIS — Z992 Dependence on renal dialysis: Secondary | ICD-10-CM | POA: Diagnosis not present

## 2023-05-31 ENCOUNTER — Telehealth: Payer: Self-pay

## 2023-05-31 DIAGNOSIS — N2581 Secondary hyperparathyroidism of renal origin: Secondary | ICD-10-CM | POA: Diagnosis not present

## 2023-05-31 DIAGNOSIS — Z992 Dependence on renal dialysis: Secondary | ICD-10-CM | POA: Diagnosis not present

## 2023-05-31 DIAGNOSIS — N186 End stage renal disease: Secondary | ICD-10-CM | POA: Diagnosis not present

## 2023-05-31 NOTE — Telephone Encounter (Signed)
Care everywhere 

## 2023-06-02 ENCOUNTER — Telehealth (INDEPENDENT_AMBULATORY_CARE_PROVIDER_SITE_OTHER): Payer: Medicare HMO | Admitting: Sports Medicine

## 2023-06-02 ENCOUNTER — Other Ambulatory Visit (HOSPITAL_COMMUNITY): Payer: Self-pay | Admitting: Sports Medicine

## 2023-06-02 DIAGNOSIS — N186 End stage renal disease: Secondary | ICD-10-CM | POA: Diagnosis not present

## 2023-06-02 DIAGNOSIS — Z992 Dependence on renal dialysis: Secondary | ICD-10-CM | POA: Diagnosis not present

## 2023-06-02 DIAGNOSIS — Z931 Gastrostomy status: Secondary | ICD-10-CM

## 2023-06-02 DIAGNOSIS — N2581 Secondary hyperparathyroidism of renal origin: Secondary | ICD-10-CM | POA: Diagnosis not present

## 2023-06-02 NOTE — Telephone Encounter (Signed)
Gwenevere Ghazi, MD Can the referral be closed?  Pt would need an order entered for Interventional radiology to to help with the PEG Tube.   Referred placed  to Intervention radiology

## 2023-06-04 ENCOUNTER — Other Ambulatory Visit: Payer: Self-pay | Admitting: *Deleted

## 2023-06-04 DIAGNOSIS — Z992 Dependence on renal dialysis: Secondary | ICD-10-CM | POA: Diagnosis not present

## 2023-06-04 DIAGNOSIS — N2581 Secondary hyperparathyroidism of renal origin: Secondary | ICD-10-CM | POA: Diagnosis not present

## 2023-06-04 DIAGNOSIS — N186 End stage renal disease: Secondary | ICD-10-CM | POA: Diagnosis not present

## 2023-06-04 MED ORDER — DEXCOM G7 SENSOR MISC: 3 refills | Status: DC

## 2023-06-04 NOTE — Telephone Encounter (Signed)
E-Prescribed Message Needing Your Attention Received: Today Interface, Surescripts Out  P Psc Rx Refill Pool An error occurred while processing the e-prescribing message.

## 2023-06-04 NOTE — Telephone Encounter (Signed)
Patient daughter called requesting Dexcom 7 sensors to be sent to [facility] pharmacy Sent.

## 2023-06-07 DIAGNOSIS — N186 End stage renal disease: Secondary | ICD-10-CM | POA: Diagnosis not present

## 2023-06-07 DIAGNOSIS — Z992 Dependence on renal dialysis: Secondary | ICD-10-CM | POA: Diagnosis not present

## 2023-06-07 DIAGNOSIS — N2581 Secondary hyperparathyroidism of renal origin: Secondary | ICD-10-CM | POA: Diagnosis not present

## 2023-06-09 DIAGNOSIS — Z992 Dependence on renal dialysis: Secondary | ICD-10-CM | POA: Diagnosis not present

## 2023-06-09 DIAGNOSIS — N186 End stage renal disease: Secondary | ICD-10-CM | POA: Diagnosis not present

## 2023-06-09 DIAGNOSIS — N2581 Secondary hyperparathyroidism of renal origin: Secondary | ICD-10-CM | POA: Diagnosis not present

## 2023-06-10 ENCOUNTER — Ambulatory Visit: Payer: Medicare HMO | Admitting: Family

## 2023-06-10 ENCOUNTER — Telehealth: Payer: Medicare HMO | Admitting: Nurse Practitioner

## 2023-06-10 ENCOUNTER — Encounter: Payer: Self-pay | Admitting: Nurse Practitioner

## 2023-06-10 DIAGNOSIS — R8290 Unspecified abnormal findings in urine: Secondary | ICD-10-CM

## 2023-06-10 DIAGNOSIS — N186 End stage renal disease: Secondary | ICD-10-CM | POA: Diagnosis not present

## 2023-06-10 DIAGNOSIS — F0280 Dementia in other diseases classified elsewhere without behavioral disturbance: Secondary | ICD-10-CM | POA: Diagnosis not present

## 2023-06-10 DIAGNOSIS — I6990 Unspecified sequelae of unspecified cerebrovascular disease: Secondary | ICD-10-CM | POA: Diagnosis not present

## 2023-06-10 DIAGNOSIS — G309 Alzheimer's disease, unspecified: Secondary | ICD-10-CM | POA: Diagnosis not present

## 2023-06-10 LAB — COMPREHENSIVE METABOLIC PANEL
Albumin: 4.2 (ref 3.5–5.0)
Calcium: 9.8 (ref 8.7–10.7)
Globulin: 2.5

## 2023-06-10 LAB — IRON,TIBC AND FERRITIN PANEL
Ferritin: 1645
Iron: 102
TIBC: 248
UIBC: 146

## 2023-06-10 LAB — CBC AND DIFFERENTIAL
HCT: 34 — AB (ref 36–46)
Hemoglobin: 11.3 — AB (ref 12.0–16.0)
Platelets: 142 10*3/uL — AB (ref 150–400)
WBC: 7.3

## 2023-06-10 LAB — BASIC METABOLIC PANEL
Chloride: 99 (ref 99–108)
Creatinine: 4 — AB (ref 0.5–1.1)
Glucose: 201
Potassium: 3.6 meq/L (ref 3.5–5.1)
Sodium: 141 (ref 137–147)

## 2023-06-10 LAB — CBC: RBC: 3.51 — AB (ref 3.87–5.11)

## 2023-06-10 NOTE — Progress Notes (Signed)
Careteam: Patient Care Team: [name], MD as PCP - General (Internal Medicine)  Advanced Directive information    Allergies  Allergen Reactions   Aspirin Anaphylaxis   Penicillins Anaphylaxis    **Tolerates cephalosporins    Chief Complaint  Patient presents with   Acute Visit    Complains of UTI and Odor and increase Heart Rate 90-100.     HPI: Patient is a 77 y.o. female  Daughter reports she has odor in her urine. She is incontinent of urine but does not urinate often due to being on dialysis.    She does not eat or drink- she is maintained on Nepro.  Has decreased her PO intake recently.   She is seeing hematology and neurologist   She has a gallbladder drainage bag and G tube which will be replaced next week- sometimes this has an odor   Her HR is elevated in the 90s.  93-97 Generally 70s-80s The change is concerning for her daughter because anything over 90 has meant something is going on.   She had cardiologist appt in December.   HD is doing CBC awaiting to see if she has elevated white count.   Mentation at baseline. She is "more fussy" used to be mild dementia and daughter feels like this is progression.  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   Dementia (HCC)    Diabetes (HCC)    Diabetes mellitus without complication (HCC)    ESRD (end stage renal disease) on dialysis (HCC)    Heart failure (HCC)    History of CT scan    History of heart attack    History of MRI    History of stroke    HTN (hypertension)    Kidney failure    PEG (percutaneous endoscopic gastrostomy) status (HCC)    Thyroid disease    Past Surgical History:  Procedure Laterality Date   ABDOMINAL HYSTERECTOMY     GALLBLADDER SURGERY     IR EXCHANGE BILIARY DRAIN  02/11/2023   IR EXCHANGE BILIARY DRAIN   04/13/2023   IR PERC CHOLECYSTOSTOMY  12/16/2022   Social History:   reports that she has never smoked. She does not have any smokeless tobacco history on file. She reports that she does not currently use alcohol. She reports that she does not use drugs.  Family History  Problem Relation Age of Onset   Diabetes Mother     Medications: Patient's Medications  New Prescriptions   No medications on file  Previous Medications   ACETAMINOPHEN (TYLENOL) 500 MG TABLET    Place 500 mg into feeding tube every 6 (six) hours as needed for mild pain.   APIXABAN (ELIQUIS) 5 MG TABS TABLET    Place 1 tablet (5 mg total) into feeding tube 2 (two) times daily.   CARVEDILOL (COREG) 3.125 MG TABLET    Place 1 tablet (3.125 mg total) into feeding tube 2 (two) times daily with a meal.   CONTINUOUS GLUCOSE RECEIVER (DEXCOM G7 RECEIVER) DEVI    Check blood sugar before meals and at bedtime   CONTINUOUS GLUCOSE SENSOR (DEXCOM G7 SENSOR) MISC    Apply to back of arm. Change sensor every 10 days.   GLUCAGON (BAQSIMI ONE PACK) 3 MG/DOSE POWD    Place 1 packet into the nose as needed.  INSULIN ASPART (NOVOLOG) 100 UNIT/ML INJECTION    0-9 Units, Subcutaneous, 3 times daily with meals, CBG < 70: Implement Hypoglycemia measures CBG 70 - 120: 0 units CBG 121 - 150: 1 unit CBG 151 - 200: 2 units CBG 201 - 250: 3 units CBG 251 - 300: 5 units CBG 301 - 350: 7 units CBG 351 - 400: 9 units CBG > 400: call MD   INSULIN GLARGINE (LANTUS) 100 UNIT/ML INJECTION    Inject 0.16 mLs (16 Units total) into the skin at bedtime.   INSULIN PEN NEEDLE 32G X 4 MM MISC    Use to inject insulin 4 times a day for blood sugar management.   MULTIVITAMIN (RENA-VIT) TABS TABLET    Take 1 tablet by mouth at bedtime.   NUTRITIONAL SUPPLEMENTS (FEEDING SUPPLEMENT, NEPRO CARB STEADY,) LIQD    Allow Nepro @ 50 ml/hr to run from 8pm-6am (10 hrs total)   POLYETHYLENE GLYCOL (MIRALAX) 17 G PACKET    Place 17 g into feeding tube daily.   SENNA  (SENOKOT) 8.6 MG TABLET    Administer 2 tablets into g-tube once daily  Modified Medications   No medications on file  Discontinued Medications   No medications on file    Physical Exam:  There were no vitals filed for this visit. There is no height or weight on file to calculate BMI. Wt Readings from Last 3 Encounters:  05/25/23 128 lb 12 oz (58.4 kg)  04/07/23 127 lb 3.3 oz (57.7 kg)  03/25/23 130 lb (59 kg)    Physical Exam Constitutional:      Appearance: Normal appearance.  Pulmonary:     Effort: Pulmonary effort is normal.  Neurological:     Mental Status: She is alert. Mental status is at baseline.     Labs reviewed: Basic Metabolic Panel: Recent Labs    03/25/23 1604 03/31/23 0050 04/04/23 0237 04/05/23 0118 04/05/23 0724 04/06/23 0311 04/07/23 0610  NA 138   < > 132* 134* 133* 134* 138  K 4.0   < > 3.5 3.1* 3.2* 3.3* 3.2*  CL 95*   < > 96* 95* 95* 99 101  CO2 35*   < > 27 28 26 29 24   GLUCOSE 97   < > 174* 135* 192* 163* 132*  BUN 14   < > 20 29* 32* 14 25*  CREATININE 2.78*   < > 1.72* 2.30* 2.48* 1.79* 2.48*  CALCIUM 9.5   < > 8.3* 8.4* 8.4* 8.4* 8.2*  MG  --    < > 1.6* 1.6*  --  1.6*  --   PHOS  --    < > 1.7* 3.8 3.8 1.9* 3.4  TSH 2.78  --   --   --   --   --   --    < > = values in this interval not displayed.   Liver Function Tests: Recent Labs    04/03/23 1321 04/03/23 1843 04/04/23 0237 04/04/23 1022 04/05/23 0724 04/06/23 0311 04/07/23 0610  AST 24  --  29  --   --  28  --   ALT 20  --  16  --   --  20  --   ALKPHOS 76  --  69  --   --  68  --   BILITOT 0.3  --  0.4  --   --  0.2*  --   PROT 5.6*  --  5.6* 5.6*  --  5.8*  --  ALBUMIN 2.3*   < > 2.3*  --  2.2* 2.4* 2.2*   < > = values in this interval not displayed.   Recent Labs    02/20/23 0244  LIPASE 53*   No results for input(s): "AMMONIA" in the last 8760 hours. CBC: Recent Labs    04/04/23 0237 04/05/23 0118 04/05/23 0724 04/06/23 0311 04/07/23 0610  WBC 7.5    < > 7.9 8.7 8.6  NEUTROABS 4.5  --   --  5.2 4.9  HGB 9.2*   < > 9.8* 9.6* 9.0*  HCT 29.0*   < > 30.7* 30.0* 28.5*  MCV 100.0   < > 100.3* 96.8 97.9  PLT 148*   < > 175 202 201   < > = values in this interval not displayed.   Lipid Panel: Recent Labs    03/25/23 1604  CHOL 142  HDL 40.40  LDLCALC 70  TRIG 158.0*  CHOLHDL 4   TSH: Recent Labs    03/25/23 1604  TSH 2.78   A1C: Lab Results  Component Value Date   HGBA1C 6.3 03/25/2023     Assessment/Plan 1. Abnormal urine odor Better today, HD has drawn labs to look at CBC and WBC.  No dysuria  2. 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 Eating and drinking less. Plans to discuss with neurology.   3. Late effects of CVA (cerebrovascular accident) -has g-tube for feedings. She is eating and drinking less which could contribute to urine odor and increase in baseline HR. Plans to talk to dietitian and HD team regarding this.   4. ESRD Continues on HD.   Janene Harvey. Biagio Borg  [facility] [phone]    Virtual Visit via video  I connected with patient on 06/10/23 at 10:40 AM EDT by mychart and verified that I am speaking with the correct person using two identifiers.  Location: Patient: home  Provider: [facility]    I discussed the limitations, risks, security and privacy concerns of performing an evaluation and management service by telephone and the availability of in person appointments. I also discussed with the patient that there may be a patient responsible charge related to this service. The patient expressed understanding and agreed to proceed.   I discussed the assessment and treatment plan with the patient. The patient was provided an opportunity to ask questions and all were answered. The patient agreed with the plan and demonstrated an understanding of the instructions.   The patient was advised to call back or seek an in-person  evaluation if the symptoms worsen or if the condition fails to improve as anticipated.  I provided 20 minutes of non-face-to-face time during this encounter.  Janene Harvey. Biagio Borg Avs printed and mailed

## 2023-06-10 NOTE — Progress Notes (Signed)
  This service is provided via telemedicine  No vital signs collected/recorded due to the encounter was a telemedicine visit.   Location of patient (ex: home, work):  Home  Patient consents to a telephone visit:  Yes  Location of the provider (ex: office, home):  Office [place].   Name of any referring provider:  na  Names of all persons participating in the telemedicine service and their role in the encounter:  Samantha [name], Patient, [name], daughter, Synetta Fail May, CMA, Abbey Chatters, NP

## 2023-06-11 ENCOUNTER — Telehealth: Payer: Self-pay | Admitting: Sports Medicine

## 2023-06-11 DIAGNOSIS — N2581 Secondary hyperparathyroidism of renal origin: Secondary | ICD-10-CM | POA: Diagnosis not present

## 2023-06-11 DIAGNOSIS — Z992 Dependence on renal dialysis: Secondary | ICD-10-CM | POA: Diagnosis not present

## 2023-06-11 DIAGNOSIS — N186 End stage renal disease: Secondary | ICD-10-CM | POA: Diagnosis not present

## 2023-06-11 NOTE — Telephone Encounter (Signed)
Pap smear screening is usually stopped at age 76 yrs old and above unless has had any abnormal pap smear or having any vaginal bleeding or other symptoms ?

## 2023-06-11 NOTE — Telephone Encounter (Signed)
Patient's daughter called and would like a GYN referral for her mother to get a pap smear and exam. This type of exam has not been done in years and daughter would like this exam to take place. FYI: patient is immobile.

## 2023-06-13 DIAGNOSIS — N186 End stage renal disease: Secondary | ICD-10-CM | POA: Diagnosis not present

## 2023-06-14 ENCOUNTER — Telehealth: Payer: Self-pay

## 2023-06-14 DIAGNOSIS — Z9189 Other specified personal risk factors, not elsewhere classified: Secondary | ICD-10-CM

## 2023-06-14 DIAGNOSIS — Z992 Dependence on renal dialysis: Secondary | ICD-10-CM | POA: Diagnosis not present

## 2023-06-14 DIAGNOSIS — N186 End stage renal disease: Secondary | ICD-10-CM | POA: Diagnosis not present

## 2023-06-14 DIAGNOSIS — N2581 Secondary hyperparathyroidism of renal origin: Secondary | ICD-10-CM | POA: Diagnosis not present

## 2023-06-14 NOTE — Telephone Encounter (Signed)
Samantha Sheffield, MD       Can you check with the patient why she needs the referral , thanks.

## 2023-06-14 NOTE — Telephone Encounter (Signed)
Noted  

## 2023-06-14 NOTE — Telephone Encounter (Signed)
Please refer back to 06/11/2023 telephone encounter.

## 2023-06-14 NOTE — Telephone Encounter (Signed)
Patient care coordinator called and states that patient wants referral to OBGYN. Message routed to PCP [name], MD

## 2023-06-15 ENCOUNTER — Ambulatory Visit (HOSPITAL_COMMUNITY)
Admission: RE | Admit: 2023-06-15 | Discharge: 2023-06-15 | Disposition: A | Discharge status: Home / Self Care | Payer: Medicare HMO | Source: Ambulatory Visit | Attending: Sports Medicine | Admitting: Sports Medicine

## 2023-06-15 ENCOUNTER — Telehealth: Payer: Self-pay

## 2023-06-15 ENCOUNTER — Other Ambulatory Visit (HOSPITAL_COMMUNITY): Payer: Self-pay | Admitting: Interventional Radiology

## 2023-06-15 ENCOUNTER — Ambulatory Visit (HOSPITAL_COMMUNITY)
Admission: RE | Admit: 2023-06-15 | Discharge: 2023-06-15 | Disposition: A | Discharge status: Home / Self Care | Payer: Medicare HMO | Source: Ambulatory Visit | Attending: Interventional Radiology

## 2023-06-15 DIAGNOSIS — K820 Obstruction of gallbladder: Secondary | ICD-10-CM | POA: Diagnosis not present

## 2023-06-15 DIAGNOSIS — K9429 Other complications of gastrostomy: Secondary | ICD-10-CM | POA: Diagnosis not present

## 2023-06-15 DIAGNOSIS — Z431 Encounter for attention to gastrostomy: Secondary | ICD-10-CM | POA: Diagnosis not present

## 2023-06-15 DIAGNOSIS — R2681 Unsteadiness on feet: Secondary | ICD-10-CM | POA: Diagnosis not present

## 2023-06-15 DIAGNOSIS — K819 Cholecystitis, unspecified: Secondary | ICD-10-CM | POA: Diagnosis not present

## 2023-06-15 DIAGNOSIS — Z931 Gastrostomy status: Secondary | ICD-10-CM

## 2023-06-15 DIAGNOSIS — Z434 Encounter for attention to other artificial openings of digestive tract: Secondary | ICD-10-CM | POA: Diagnosis not present

## 2023-06-15 DIAGNOSIS — R627 Adult failure to thrive: Secondary | ICD-10-CM | POA: Diagnosis not present

## 2023-06-15 DIAGNOSIS — K9423 Gastrostomy malfunction: Secondary | ICD-10-CM | POA: Insufficient documentation

## 2023-06-15 DIAGNOSIS — K810 Acute cholecystitis: Secondary | ICD-10-CM | POA: Diagnosis not present

## 2023-06-15 DIAGNOSIS — Z9049 Acquired absence of other specified parts of digestive tract: Secondary | ICD-10-CM | POA: Diagnosis not present

## 2023-06-15 DIAGNOSIS — N186 End stage renal disease: Secondary | ICD-10-CM

## 2023-06-15 DIAGNOSIS — E46 Unspecified protein-calorie malnutrition: Secondary | ICD-10-CM | POA: Diagnosis not present

## 2023-06-15 DIAGNOSIS — M6281 Muscle weakness (generalized): Secondary | ICD-10-CM | POA: Diagnosis not present

## 2023-06-15 DIAGNOSIS — R278 Other lack of coordination: Secondary | ICD-10-CM | POA: Diagnosis not present

## 2023-06-15 HISTORY — PX: IR EXCHANGE BILIARY DRAIN: IMG6046

## 2023-06-15 HISTORY — PX: IR REPLC GASTRO/COLONIC TUBE PERCUT W/FLUORO: IMG2333

## 2023-06-15 MED ORDER — LIDOCAINE VISCOUS HCL 2 % MT SOLN
OROMUCOSAL | Status: AC
  Filled 2023-06-15: qty 15

## 2023-06-15 MED ORDER — IOHEXOL 300 MG/ML  SOLN
50.0000 mL | Freq: Once | INTRAMUSCULAR | Status: AC | PRN
  Administered 2023-06-15: 30 mL

## 2023-06-15 MED ORDER — LIDOCAINE-EPINEPHRINE 1 %-1:100000 IJ SOLN
20.0000 mL | Freq: Once | INTRAMUSCULAR | Status: AC
  Administered 2023-06-15: 3 mL via INTRADERMAL

## 2023-06-15 MED ORDER — LIDOCAINE-EPINEPHRINE 1 %-1:100000 IJ SOLN
INTRAMUSCULAR | Status: AC
  Filled 2023-06-15: qty 1

## 2023-06-15 NOTE — Telephone Encounter (Signed)
Below is [name], MD response:  [name], MD  You2 hours ago (1:47 PM)    Plz instruct to check bp with HR Monitor for sob, chest pain, dizzy or lightheadedness If HR continues to run high , she needs to be seen in clinic.  Discussed with aptietns daughter and scheduled appointment for next Thursday due to transportation issues

## 2023-06-15 NOTE — Telephone Encounter (Signed)
[name], MD  [facility] Pool15 minutes ago (8:36 AM)    Agree with Carilyn Goodpasture that pt does not need pap smear after 65, I will place a referral as per family request.

## 2023-06-15 NOTE — Telephone Encounter (Signed)
Patients daughter called after speaking with Nurse from [facility] to express that her mother present with a pulse of 105, usually's 60-80. For the last week in half pulse in the high 90's. Patient is asymptomatic, denies SOB, blurred vision, dizziness, chest pain, or headaches.   [name] wants to highlight that her mother has CHF and is a dialysis patient.  Please advise

## 2023-06-16 ENCOUNTER — Emergency Department (HOSPITAL_COMMUNITY)
Admission: EM | Admit: 2023-06-16 | Discharge: 2023-06-17 | Disposition: A | Discharge status: Home / Self Care | Payer: Medicare HMO | Attending: Emergency Medicine | Admitting: Emergency Medicine

## 2023-06-16 ENCOUNTER — Emergency Department (HOSPITAL_COMMUNITY): Payer: Medicare HMO

## 2023-06-16 DIAGNOSIS — I120 Hypertensive chronic kidney disease with stage 5 chronic kidney disease or end stage renal disease: Secondary | ICD-10-CM | POA: Diagnosis not present

## 2023-06-16 DIAGNOSIS — R0689 Other abnormalities of breathing: Secondary | ICD-10-CM | POA: Insufficient documentation

## 2023-06-16 DIAGNOSIS — R051 Acute cough: Secondary | ICD-10-CM | POA: Insufficient documentation

## 2023-06-16 DIAGNOSIS — R000 Tachycardia, unspecified: Secondary | ICD-10-CM | POA: Insufficient documentation

## 2023-06-16 DIAGNOSIS — E1122 Type 2 diabetes mellitus with diabetic chronic kidney disease: Secondary | ICD-10-CM | POA: Diagnosis not present

## 2023-06-16 DIAGNOSIS — Z7901 Long term (current) use of anticoagulants: Secondary | ICD-10-CM | POA: Insufficient documentation

## 2023-06-16 DIAGNOSIS — Z992 Dependence on renal dialysis: Secondary | ICD-10-CM | POA: Diagnosis not present

## 2023-06-16 DIAGNOSIS — N186 End stage renal disease: Secondary | ICD-10-CM | POA: Diagnosis not present

## 2023-06-16 DIAGNOSIS — J986 Disorders of diaphragm: Secondary | ICD-10-CM | POA: Diagnosis not present

## 2023-06-16 DIAGNOSIS — Z1152 Encounter for screening for COVID-19: Secondary | ICD-10-CM | POA: Insufficient documentation

## 2023-06-16 DIAGNOSIS — R079 Chest pain, unspecified: Secondary | ICD-10-CM | POA: Diagnosis not present

## 2023-06-16 DIAGNOSIS — E119 Type 2 diabetes mellitus without complications: Secondary | ICD-10-CM | POA: Insufficient documentation

## 2023-06-16 DIAGNOSIS — R059 Cough, unspecified: Secondary | ICD-10-CM | POA: Diagnosis not present

## 2023-06-16 DIAGNOSIS — R1013 Epigastric pain: Secondary | ICD-10-CM | POA: Diagnosis not present

## 2023-06-16 DIAGNOSIS — Z79899 Other long term (current) drug therapy: Secondary | ICD-10-CM | POA: Diagnosis not present

## 2023-06-16 DIAGNOSIS — N2581 Secondary hyperparathyroidism of renal origin: Secondary | ICD-10-CM | POA: Diagnosis not present

## 2023-06-16 DIAGNOSIS — Z794 Long term (current) use of insulin: Secondary | ICD-10-CM | POA: Diagnosis not present

## 2023-06-16 DIAGNOSIS — R509 Fever, unspecified: Secondary | ICD-10-CM | POA: Diagnosis not present

## 2023-06-16 DIAGNOSIS — R1084 Generalized abdominal pain: Secondary | ICD-10-CM | POA: Diagnosis not present

## 2023-06-16 LAB — CBC WITH DIFFERENTIAL/PLATELET
Abs Immature Granulocytes: 0.12 10*3/uL — ABNORMAL HIGH (ref 0.00–0.07)
Basophils Absolute: 0 10*3/uL (ref 0.0–0.1)
Basophils Relative: 0 %
Eosinophils Absolute: 0.1 10*3/uL (ref 0.0–0.5)
Eosinophils Relative: 1 %
HCT: 34.3 % — ABNORMAL LOW (ref 36.0–46.0)
Hemoglobin: 11.2 g/dL — ABNORMAL LOW (ref 12.0–15.0)
Immature Granulocytes: 1 %
Lymphocytes Relative: 39 %
Lymphs Abs: 3.7 10*3/uL (ref 0.7–4.0)
MCH: 31 pg (ref 26.0–34.0)
MCHC: 32.7 g/dL (ref 30.0–36.0)
MCV: 95 fL (ref 80.0–100.0)
Monocytes Absolute: 1 10*3/uL (ref 0.1–1.0)
Monocytes Relative: 10 %
Neutro Abs: 4.4 10*3/uL (ref 1.7–7.7)
Neutrophils Relative %: 49 %
Platelets: 174 10*3/uL (ref 150–400)
RBC: 3.61 MIL/uL — ABNORMAL LOW (ref 3.87–5.11)
RDW: 14.3 % (ref 11.5–15.5)
WBC: 9.4 10*3/uL (ref 4.0–10.5)
nRBC: 0 % (ref 0.0–0.2)

## 2023-06-16 LAB — COMPREHENSIVE METABOLIC PANEL
ALT: 33 U/L (ref 0–44)
AST: 33 U/L (ref 15–41)
Albumin: 3.7 g/dL (ref 3.5–5.0)
Alkaline Phosphatase: 130 U/L — ABNORMAL HIGH (ref 38–126)
Anion gap: 12 (ref 5–15)
BUN: 18 mg/dL (ref 8–23)
CO2: 31 mmol/L (ref 22–32)
Calcium: 8.7 mg/dL — ABNORMAL LOW (ref 8.9–10.3)
Chloride: 94 mmol/L — ABNORMAL LOW (ref 98–111)
Creatinine, Ser: 2.29 mg/dL — ABNORMAL HIGH (ref 0.44–1.00)
GFR, Estimated: 22 mL/min — ABNORMAL LOW (ref >60)
Glucose, Bld: 161 mg/dL — ABNORMAL HIGH (ref 70–99)
Potassium: 3.3 mmol/L — ABNORMAL LOW (ref 3.5–5.1)
Sodium: 137 mmol/L (ref 135–145)
Total Bilirubin: 0.6 mg/dL (ref 0.3–1.2)
Total Protein: 7.3 g/dL (ref 6.5–8.1)

## 2023-06-16 LAB — BRAIN NATRIURETIC PEPTIDE: B Natriuretic Peptide: 198.6 pg/mL — ABNORMAL HIGH (ref 0.0–100.0)

## 2023-06-16 LAB — MAGNESIUM: Magnesium: 2.3 mg/dL (ref 1.7–2.4)

## 2023-06-16 LAB — LIPASE, BLOOD: Lipase: 60 U/L — ABNORMAL HIGH (ref 11–51)

## 2023-06-16 LAB — TSH: TSH: 3.036 u[IU]/mL (ref 0.350–4.500)

## 2023-06-16 LAB — LACTIC ACID, PLASMA: Lactic Acid, Venous: 1.8 mmol/L (ref 0.5–1.9)

## 2023-06-16 NOTE — ED Provider Notes (Signed)
[place] EMERGENCY DEPARTMENT AT [facility] ([facility]) Provider Note   CSN: [id number] Arrival date & time: 06/16/23  2045     History  Chief Complaint  Patient presents with   Chest Pain    Samantha [name] is a 77 y.o. female.  The history is provided by the patient and medical records. No language interpreter was used.  Chest Pain Pain location:  Unable to specify (denies but told someone she did) Pain severity:  No pain Progression:  Unchanged Relieved by:  Nothing Worsened by:  Nothing Ineffective treatments:  None tried Associated symptoms: cough and fatigue   Associated symptoms: no abdominal pain, no altered mental status, no anxiety, no back pain, no diaphoresis, no fever, no headache, no nausea, no near-syncope, no numbness, no palpitations, no shortness of breath and no vomiting        Home Medications Prior to Admission medications   Medication Sig Start Date End Date Taking? Authorizing Provider  acetaminophen (TYLENOL) 500 MG tablet Place 500 mg into feeding tube every 6 (six) hours as needed for mild pain.    [provider]  apixaban (ELIQUIS) 5 MG TABS tablet Place 1 tablet (5 mg total) into feeding tube 2 (two) times daily. 04/27/23   Salvatore Decent, FNP  carvedilol (COREG) 3.125 MG tablet Place 1 tablet (3.125 mg total) into feeding tube 2 (two) times daily with a meal. 04/15/23   Salvatore Decent, FNP  Continuous Glucose Receiver (DEXCOM G7 RECEIVER) DEVI Check blood sugar before meals and at bedtime 04/20/23   Salvatore Decent, FNP  Continuous Glucose Sensor (DEXCOM G7 SENSOR) MISC Apply to back of arm. Change sensor every 10 days. 06/04/23   [name], MD  Glucagon (BAQSIMI ONE PACK) 3 MG/DOSE POWD Place 1 packet into the nose as needed.    [provider]  insulin aspart (NOVOLOG) 100 UNIT/ML injection 0-9 Units, Subcutaneous, 3 times daily with meals, CBG < 70: Implement Hypoglycemia measures CBG 70 - 120: 0 units CBG 121 - 150: 1  unit CBG 151 - 200: 2 units CBG 201 - 250: 3 units CBG 251 - 300: 5 units CBG 301 - 350: 7 units CBG 351 - 400: 9 units CBG > 400: call MD 12/25/22   Maretta Bees, MD  insulin glargine (LANTUS) 100 UNIT/ML injection Inject 0.16 mLs (16 Units total) into the skin at bedtime. 12/25/22   Ghimire, Werner Lean, MD  Insulin Pen Needle 32G X 4 MM MISC Use to inject insulin 4 times a day for blood sugar management. 04/19/23   Salvatore Decent, FNP  multivitamin (RENA-VIT) TABS tablet Take 1 tablet by mouth at bedtime. 12/25/22   Ghimire, Werner Lean, MD  Nutritional Supplements (FEEDING SUPPLEMENT, NEPRO CARB STEADY,) LIQD Allow Nepro @ 50 ml/hr to run from 8pm-6am (10 hrs total) 04/27/23   Salvatore Decent, FNP  polyethylene glycol (MIRALAX) 17 g packet Place 17 g into feeding tube daily. 04/06/23   Zigmund Daniel., MD  senna [facility]) 8.6 MG tablet Administer 2 tablets into g-tube once daily 04/20/23   Salvatore Decent, FNP      Allergies    Aspirin and Penicillins    Review of Systems   Review of Systems  Constitutional:  Positive for chills and fatigue. Negative for diaphoresis and fever.  HENT:  Positive for congestion.   Respiratory:  Positive for cough. Negative for chest tightness, shortness of breath and wheezing.   Cardiovascular:  Positive for chest pain. Negative for palpitations, leg  swelling and near-syncope.  Gastrointestinal:  Negative for abdominal pain, constipation, diarrhea, nausea and vomiting.  Genitourinary:  Negative for dysuria.  Musculoskeletal:  Negative for back pain and neck pain.  Skin:  Negative for rash and wound.  Neurological:  Negative for light-headedness, numbness and headaches.  Psychiatric/Behavioral:  Negative for agitation and confusion.   All other systems reviewed and are negative.   Physical Exam Updated Vital Signs BP 100/75 (BP Location: Left Arm)   Pulse (!) 105   Temp 99.4 F (37.4 C) (Oral)   Resp (!) 24   SpO2 99%  Physical Exam Vitals and nursing  note reviewed.  Constitutional:      General: She is not in acute distress.    Appearance: She is well-developed. She is not ill-appearing, toxic-appearing or diaphoretic.  HENT:     Head: Normocephalic and atraumatic.     Right Ear: External ear normal.     Left Ear: External ear normal.     Nose: Nose normal.     Mouth/Throat:     Pharynx: No oropharyngeal exudate.  Eyes:     Conjunctiva/sclera: Conjunctivae normal.     Pupils: Pupils are equal, round, and reactive to light.  Cardiovascular:     Rate and Rhythm: Tachycardia present.     Heart sounds: Normal heart sounds.  Pulmonary:     Effort: No respiratory distress.     Breath sounds: No stridor. Rhonchi present. No rales.  Chest:     Chest wall: No tenderness.  Abdominal:     General: There is no distension.     Tenderness: There is no abdominal tenderness. There is no rebound.  Musculoskeletal:     Cervical back: Normal range of motion and neck supple.     Right lower leg: No edema.     Left lower leg: No edema.  Skin:    General: Skin is warm.     Capillary Refill: Capillary refill takes less than 2 seconds.     Findings: No erythema or rash.  Neurological:     General: No focal deficit present.     Mental Status: She is alert.     Motor: No abnormal muscle tone.     Coordination: Coordination normal.     Deep Tendon Reflexes: Reflexes are normal and symmetric.     ED Results / Procedures / Treatments   Labs (all labs ordered are listed, but only abnormal results are displayed) Labs Reviewed  CBC WITH DIFFERENTIAL/PLATELET - Abnormal; Notable for the following components:      Result Value   RBC 3.61 (*)    Hemoglobin 11.2 (*)    HCT 34.3 (*)    Abs Immature Granulocytes 0.12 (*)    All other components within normal limits  CULTURE, BLOOD (ROUTINE X 2)  CULTURE, BLOOD (ROUTINE X 2)  RESP PANEL BY RT-PCR (RSV, FLU A&B, COVID)  RVPGX2  COMPREHENSIVE METABOLIC PANEL  LACTIC ACID, PLASMA  LACTIC ACID,  PLASMA  BRAIN NATRIURETIC PEPTIDE  TSH  MAGNESIUM  LIPASE, BLOOD    EKG EKG Interpretation Date/Time:  Wednesday June 16 2023 20:52:16 EDT Ventricular Rate:  106 PR Interval:  149 QRS Duration:  92 QT Interval:  355 QTC Calculation: 472 R Axis:   -80  Text Interpretation: Sinus tachycardia Multiple ventricular premature complexes Abnormal R-wave progression, late transition Inferior infarct, old Abnormal lateral Q waves when compared to prior, new PVCs No STEMI Confirmed by Theda Belfast ([id number]) on 06/16/2023 8:57:58 PM  Radiology IR  Replc Gastro/Colonic Tube Percut W/Fluoro  Result Date: 06/16/2023 INDICATION: 77 year old woman with chronic gastrostomy tube presents to IR for exchange of damaged tube. EXAM: Fluoroscopy guided gastrostomy tube exchange MEDICATIONS: None ANESTHESIA/SEDATION: None CONTRAST:  30 mL of Omnipaque 300-administered into the gastric lumen. FLUOROSCOPY: Radiation Exposure Index (as provided by the fluoroscopic device): 3 mGy Kerma COMPLICATIONS: None immediate. PROCEDURE: Informed written consent was obtained from the patient's/patient representative after a thorough discussion of the procedural risks, benefits and alternatives. All questions were addressed. Maximal Sterile Barrier Technique was utilized including caps, mask, sterile gowns, sterile gloves, sterile drape, hand hygiene and skin antiseptic. A timeout was performed prior to the initiation of the procedure. Contrast administered through existing 20 [place] gastrostomy tube confirmed appropriate positioning within the gastric lumen. The balloon was deflated and the feeding tube was removed over 0.035 inch guidewire. New 20 French gastrostomy tube was inserted over the guidewire. The balloon was insufflated with 10 mL of normal saline and retracted to the anterior gastric wall. Contrast administered through the new G tube confirmed appropriate positioning within the gastric lumen. It was flushed with  normal saline and covered with sterile dressing. IMPRESSION: Successful exchange of percutaneous gastrostomy tube with new 20 [place] G-tube in place. Electronically Signed   By: Acquanetta Belling M.D.   On: 06/16/2023 08:06   IR EXCHANGE BILIARY DRAIN  Result Date: 06/16/2023 INDICATION: 77 year old woman with history of cholecystitis underwent cholecystostomy drain placement on 12/16/2022. She returns today for routine exchange. EXAM: Cholecystostomy drain exchange MEDICATIONS: None ANESTHESIA/SEDATION: None FLUOROSCOPY: Radiation Exposure Index (as provided by the fluoroscopic device): 3 mGy Kerma COMPLICATIONS: None immediate. PROCEDURE: Informed written consent was obtained from the patient's/patient representative after a thorough discussion of the procedural risks, benefits and alternatives. All questions were addressed. Maximal Sterile Barrier Technique was utilized including caps, mask, sterile gowns, sterile gloves, sterile drape, hand hygiene and skin antiseptic. A timeout was performed prior to the initiation of the procedure. Scout image demonstrated right upper quadrant cholecystostomy drain in appropriate position. Contrast administered through the drain under fluoroscopy showed multiple filling defects consistent with gallstones. The cystic duct remains occluded. The existing 10 French drain was cut and removed over 0.035 inch guidewire and replaced with identical 10 [place] multipurpose pigtail drain. Contrast administered through the new drain confirmed appropriate positioning within the gallbladder lumen. The drain was secured to skin with suture and connected to bag. IMPRESSION: Cholangiogram performed through cholecystostomy drain shows continued obstruction of cystic duct. Drain exchange performed with new 10 French pigtail catheter in place. PLAN: Return in 10 weeks for routine exchange. Electronically Signed   By: Acquanetta Belling M.D.   On: 06/16/2023 08:03    Procedures Procedures     Medications Ordered in ED Medications - No data to display  ED Course/ Medical Decision Making/ A&P                                 Medical Decision Making Amount and/or Complexity of Data Reviewed Labs: ordered. Radiology: ordered.    Elvenia Glosser is a 77 y.o. female with a past medical history significant for hypertension, diabetes, previous TTP, previous DVT, ESRD on dialysis MWF, previous MI, previous stroke and PEG tube who presents for tachycardia, mild cough, and possible chest discomfort.  According to patient and family, she has had tachycardia since last night which is atypical for her.  Has been over 100 persistently.  She had  dialysis today and reportedly was saying she was having some discomfort in her chest.  She is now denying that but she told someone she had pain in her chest and epigastric area.  She is not reporting nausea, vomiting, constipation, diarrhea, she does not make much urine.  Denies any leg pain or leg swelling.  She reports he is feeling well now, just feels tired and has had some coughing.  On arrival, patient is warm to the touch and oral temperature was 99.4, will get a rectal temperature.  She is tachycardic, tachypneic, and has pressure around 100 systolic.  On exam, she does have some rhonchi but no significant wheezing.  No rales on my exam.  Chest was nontender and abdomen was nontender.  She had strength in extremities and had symmetric smile.  Speech was clear and at baseline per family.  Pupils symmetric and reactive.  Given the patient's vital signs I am somewhat concerned she could have pneumonia given his cough.  Will get chest x-ray.  Will get labs including a COVID swab.  If vital signs improved and she remains otherwise well-appearing, she may be stable for discharge home with viral URI causing this cough and vital sign changes.  If she does have a large pneumonia or starts to feel worse, patient may need admission.  Given lack of  pleuritic chest discomfort significant shortness of breath, or discomfort, do not have a large suspicion for pulmonary embolism at this time.  She does still make some urine so we will hold on CT PE study.  Anticipate reassessment after workup is completed.  Care transferred to oncoming team to wait for workup results.         Final Clinical Impression(s) / ED Diagnoses Final diagnoses:  Tachycardia  Acute cough     Clinical Impression: 1. Tachycardia   2. Acute cough     Disposition: Anticipate reassessment after workup is completed.  Care transferred to oncoming team to wait for workup results.  This note was prepared with assistance of Conservation officer, historic buildings. Occasional wrong-word or sound-a-like substitutions may have occurred due to the inherent limitations of voice recognition software.      Zyir Gassert, Canary Brim, MD 06/16/23 [phone]

## 2023-06-16 NOTE — ED Triage Notes (Signed)
Pt BIB EMS from home. C/o CP/epigastric pain. Had dialysis today. A&O Denies any pain at this time.   EMS VS: 140/76, HR 110, R 20, 97% RA, cbg 190, T 99.7

## 2023-06-17 DIAGNOSIS — R531 Weakness: Secondary | ICD-10-CM | POA: Diagnosis not present

## 2023-06-17 DIAGNOSIS — N186 End stage renal disease: Secondary | ICD-10-CM | POA: Diagnosis not present

## 2023-06-17 DIAGNOSIS — Z7401 Bed confinement status: Secondary | ICD-10-CM | POA: Diagnosis not present

## 2023-06-17 DIAGNOSIS — R000 Tachycardia, unspecified: Secondary | ICD-10-CM | POA: Diagnosis not present

## 2023-06-17 LAB — TROPONIN I (HIGH SENSITIVITY)
Troponin I (High Sensitivity): 51 ng/L — ABNORMAL HIGH (ref <18)
Troponin I (High Sensitivity): 65 ng/L — ABNORMAL HIGH (ref <18)

## 2023-06-17 LAB — RESP PANEL BY RT-PCR (RSV, FLU A&B, COVID)  RVPGX2
Influenza A by PCR: NEGATIVE
Influenza B by PCR: NEGATIVE
Resp Syncytial Virus by PCR: NEGATIVE
SARS Coronavirus 2 by RT PCR: NEGATIVE

## 2023-06-17 MED ORDER — DOXYCYCLINE HYCLATE 100 MG PO CAPS: 100.0000 mg | ORAL_CAPSULE | Freq: Two times a day (BID) | ORAL | 0 refills | Status: DC

## 2023-06-17 NOTE — ED Provider Notes (Signed)
Patient signed out pending testing.  In brief presented with concerns for tachycardia and cough.  Workup initiated.  EKG with no evidence of acute ischemia or arrhythmia.  Chest x-ray shows no obvious pneumonia.  Lactate normal.  No white count.  No signs or symptoms of sepsis.  Creatinine 2.29.  Corresponding troponin 51 and 65 effectively.  Patient has not had any active chest discomfort.  Doubt primary ACS.  Discussed at length with the daughters my suspicion that her mild tachycardia may be related to low-grade fever at home.  Given that and cough, will treat her presumptively for pneumonia even though her chest x-ray is without obvious infiltrate.  Low suspicion for PE.  Could be early in the process.  Would not at this time pursue IV fluids given her end-stage renal disease and concern for congestive heart failure.  However would make sure that she is getting adequate fluids at home.  Family updated at bedside.  Physical Exam  BP (!) 112/58 (BP Location: Right Arm)   Pulse (!) 101   Temp 98.6 F (37 C) (Oral)   Resp 17   SpO2 99%   Physical Exam Awake and alert, no acute distress Procedures  Procedures  ED Course / MDM    Medical Decision Making Amount and/or Complexity of Data Reviewed Labs: ordered. Radiology: ordered.  Risk Prescription drug management.   Problem List Items Addressed This Visit   None Visit Diagnoses     Tachycardia    -  Primary   Acute cough                 Wilkie Aye, Mayer Masker, MD 06/17/23 [phone]

## 2023-06-17 NOTE — Discharge Instructions (Signed)
You were seen today with concerns for tachycardia.  This may be related to low-grade fever.  Given your cough, you will be treated presumptively for pneumonia although your x-ray does not show an obvious pneumonia at this time.  Your COVID and flu testing were negative.  Make sure that you are staying hydrated.  Follow-up with your primary doctor.

## 2023-06-18 DIAGNOSIS — N186 End stage renal disease: Secondary | ICD-10-CM | POA: Diagnosis not present

## 2023-06-18 DIAGNOSIS — Z992 Dependence on renal dialysis: Secondary | ICD-10-CM | POA: Diagnosis not present

## 2023-06-18 DIAGNOSIS — N2581 Secondary hyperparathyroidism of renal origin: Secondary | ICD-10-CM | POA: Diagnosis not present

## 2023-06-21 ENCOUNTER — Telehealth: Payer: Self-pay | Admitting: Sports Medicine

## 2023-06-21 ENCOUNTER — Telehealth: Payer: Self-pay

## 2023-06-21 DIAGNOSIS — N2581 Secondary hyperparathyroidism of renal origin: Secondary | ICD-10-CM | POA: Diagnosis not present

## 2023-06-21 DIAGNOSIS — N186 End stage renal disease: Secondary | ICD-10-CM | POA: Diagnosis not present

## 2023-06-21 DIAGNOSIS — Z992 Dependence on renal dialysis: Secondary | ICD-10-CM | POA: Diagnosis not present

## 2023-06-21 LAB — CULTURE, BLOOD (ROUTINE X 2)
Culture: NO GROWTH
Culture: NO GROWTH
Special Requests: ADEQUATE
Special Requests: ADEQUATE

## 2023-06-21 NOTE — Telephone Encounter (Signed)
error 

## 2023-06-21 NOTE — Transitions of Care (Post Inpatient/ED Visit) (Unsigned)
06/21/2023  Name: Samantha [name] MRN: [id number] DOB: 07-Nov-1945  Today's TOC FU Call Status: Today's TOC FU Call Status:: Unsuccessful Call (1st Attempt) Unsuccessful Call (1st Attempt) Date: 06/21/23  Attempted to reach the patient regarding the most recent Inpatient/ED visit.  Follow Up Plan: Additional outreach attempts will be made to reach the patient to complete the Transitions of Care (Post Inpatient/ED visit) call.   Signature : Brin Ruggerio.D/RMA

## 2023-06-22 DIAGNOSIS — D696 Thrombocytopenia, unspecified: Secondary | ICD-10-CM | POA: Diagnosis not present

## 2023-06-22 NOTE — Telephone Encounter (Signed)
Patient has upcoming appointment 06/24/2023 with Ella Bodo, NP

## 2023-06-23 DIAGNOSIS — N2581 Secondary hyperparathyroidism of renal origin: Secondary | ICD-10-CM | POA: Diagnosis not present

## 2023-06-23 DIAGNOSIS — Z992 Dependence on renal dialysis: Secondary | ICD-10-CM | POA: Diagnosis not present

## 2023-06-23 DIAGNOSIS — N186 End stage renal disease: Secondary | ICD-10-CM | POA: Diagnosis not present

## 2023-06-24 ENCOUNTER — Encounter: Payer: Self-pay | Admitting: Adult Health

## 2023-06-24 ENCOUNTER — Ambulatory Visit (INDEPENDENT_AMBULATORY_CARE_PROVIDER_SITE_OTHER): Payer: Medicare HMO | Admitting: Adult Health

## 2023-06-24 VITALS — BP 122/78 | HR 105 | Temp 97.3°F | Resp 20 | Ht 59.0 in | Wt 129.0 lb

## 2023-06-24 DIAGNOSIS — E1122 Type 2 diabetes mellitus with diabetic chronic kidney disease: Secondary | ICD-10-CM | POA: Diagnosis not present

## 2023-06-24 DIAGNOSIS — J069 Acute upper respiratory infection, unspecified: Secondary | ICD-10-CM

## 2023-06-24 DIAGNOSIS — G309 Alzheimer's disease, unspecified: Secondary | ICD-10-CM

## 2023-06-24 DIAGNOSIS — I6990 Unspecified sequelae of unspecified cerebrovascular disease: Secondary | ICD-10-CM

## 2023-06-24 DIAGNOSIS — R000 Tachycardia, unspecified: Secondary | ICD-10-CM

## 2023-06-24 DIAGNOSIS — F0280 Dementia in other diseases classified elsewhere without behavioral disturbance: Secondary | ICD-10-CM

## 2023-06-24 DIAGNOSIS — N186 End stage renal disease: Secondary | ICD-10-CM

## 2023-06-24 DIAGNOSIS — Z992 Dependence on renal dialysis: Secondary | ICD-10-CM | POA: Diagnosis not present

## 2023-06-24 MED ORDER — CARVEDILOL 3.125 MG PO TABS
3.1250 mg | ORAL_TABLET | Freq: Two times a day (BID) | ORAL | Status: DC
Start: 2023-06-24 — End: 2023-07-02

## 2023-06-24 NOTE — Progress Notes (Signed)
[facility] clinic  Provider: Kenard Gower DNP  Code Status:  Full Code  Goals of Care:     06/16/2023    8:59 PM  Advanced Directives  Does Patient Have a Medical Advance Directive? No  Would patient like information on creating a medical advance directive? No - Patient declined     Chief Complaint  Patient presents with   Transitions Of Care    TOC & Discuss elevated pulse    HPI: Patient is a 77 y.o. female seen today for transition of care. She was seen in the ED on 06/16/23 to 06/16/23 for tachycardia and cough.  Workup in the ED and that show acute issues.  Chest x-ray was negative for pneumonia.  It was suspected that her mild tachycardia may be related to the low-grade fever at home.  Since she has cough, she was started on doxycycline for presumptive pneumonia.  She was accompanied today by her daughter.   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   Dementia (HCC)    Diabetes (HCC)    Diabetes mellitus without complication (HCC)    ESRD (end stage renal disease) on dialysis (HCC)    Heart failure (HCC)    History of CT scan    History of heart attack    History of MRI    History of stroke    HTN (hypertension)    Kidney failure    PEG (percutaneous endoscopic gastrostomy) status (HCC)    Thyroid disease     Past Surgical History:  Procedure Laterality Date   ABDOMINAL HYSTERECTOMY     GALLBLADDER SURGERY     IR EXCHANGE BILIARY DRAIN  02/11/2023   IR EXCHANGE BILIARY DRAIN  04/13/2023   IR EXCHANGE BILIARY DRAIN  06/15/2023   IR PERC CHOLECYSTOSTOMY  12/16/2022   IR REPLC GASTRO/COLONIC TUBE PERCUT W/FLUORO  06/15/2023    Allergies  Allergen Reactions   Aspirin Anaphylaxis   Penicillins Anaphylaxis    **Tolerates cephalosporins    Outpatient Encounter Medications as of 06/24/2023  Medication Sig   acetaminophen (TYLENOL) 500 MG tablet Place 500 mg into feeding tube every  6 (six) hours as needed for mild pain.   apixaban (ELIQUIS) 5 MG TABS tablet Place 1 tablet (5 mg total) into feeding tube 2 (two) times daily.   carvedilol (COREG) 3.125 MG tablet Place 1 tablet (3.125 mg total) into feeding tube 2 (two) times daily with a meal.   Continuous Glucose Receiver (DEXCOM G7 RECEIVER) DEVI Check blood sugar before meals and at bedtime   Continuous Glucose Sensor (DEXCOM G7 SENSOR) MISC Apply to back of arm. Change sensor every 10 days.   doxycycline (VIBRAMYCIN) 100 MG capsule Take 1 capsule (100 mg total) by mouth 2 (two) times daily.   Glucagon (BAQSIMI ONE PACK) 3 MG/DOSE POWD Place 1 packet into the nose as needed (For blood sugar). Use when blood sugar drops below 70.   insulin aspart (NOVOLOG) 100 UNIT/ML injection 0-9 Units, Subcutaneous, 3 times daily with meals, CBG < 70: Implement Hypoglycemia measures CBG 70 - 120: 0 units CBG 121 - 150: 1 unit CBG 151 - 200: 2 units CBG 201 - 250: 3 units CBG 251 - 300: 5 units CBG 301 - 350: 7 units CBG 351 - 400: 9 units CBG > 400: call MD   insulin glargine (LANTUS) 100 UNIT/ML injection Inject 0.16 mLs (16 Units total) into the skin  at bedtime.   Insulin Pen Needle 32G X 4 MM MISC Use to inject insulin 4 times a day for blood sugar management.   multivitamin (RENA-VIT) TABS tablet Take 1 tablet by mouth at bedtime.   Nutritional Supplements (FEEDING SUPPLEMENT, NEPRO CARB STEADY,) LIQD Allow Nepro @ 50 ml/hr to run from 8pm-6am (10 hrs total) (Patient taking differently: Allow Nepro @ 50 ml/hr to run from 8pm-8am (12 hrs total). Use of water to flush g-tube before and after tube feeding.)   senna (SENOKOT) 8.6 MG tablet Administer 2 tablets into g-tube once daily   vitamin B-12 (CYANOCOBALAMIN) 50 MCG tablet Take 50 mcg by mouth daily.   No facility-administered encounter medications on file as of 06/24/2023.    Review of Systems:  Review of Systems  Unable to obtain due to dementia  Health Maintenance  Topic  Date Due   Medicare Annual Wellness (AWV)  Never done   FOOT EXAM  Never done   OPHTHALMOLOGY EXAM  Never done   Hepatitis C Screening  Never done   DTaP/Tdap/Td (1 - Tdap) Never done   Zoster Vaccines- Shingrix (1 of 2) Never done   DEXA SCAN  Never done   Pneumonia Vaccine 41+ Years old (2 of 2 - PCV) 01/24/2021   INFLUENZA VACCINE  03/25/2023   COVID-19 Vaccine (5 - 2023-24 season) 04/25/2023   HEMOGLOBIN A1C  10/16/2023   HPV VACCINES  Aged Out    Physical Exam: Vitals:   06/24/23 1257  BP: 122/78  Pulse: (!) 105  Resp: 20  Temp: (!) 97.3 F (36.3 C)  SpO2: 99%  Weight: 129 lb (58.5 kg)  Height: 4\' 11"  (1.499 m)   Body mass index is 26.05 kg/m. Physical Exam Constitutional:      General: She is not in acute distress.    Appearance: Normal appearance.  HENT:     Head: Normocephalic and atraumatic.     Nose: Nose normal.     Mouth/Throat:     Mouth: Mucous membranes are moist.  Eyes:     Conjunctiva/sclera: Conjunctivae normal.  Cardiovascular:     Rate and Rhythm: Regular rhythm. Tachycardia present.     Comments: Right chest dialysis catheter Pulmonary:     Effort: Pulmonary effort is normal.     Breath sounds: Normal breath sounds.  Abdominal:     General: Bowel sounds are normal.     Palpations: Abdomen is soft.     Comments: Right percutaneous drain and LUQ PEG   Musculoskeletal:     Cervical back: Normal range of motion.     Comments: Not moving BLE  Skin:    General: Skin is warm and dry.  Neurological:     Mental Status: She is alert. Mental status is at baseline.     Comments: Left facial droop  Psychiatric:        Mood and Affect: Mood normal.        Behavior: Behavior normal.     Labs reviewed: Basic Metabolic Panel: Recent Labs    03/25/23 1604 03/31/23 0050 04/05/23 0118 04/05/23 0724 04/06/23 0311 04/07/23 0610 06/10/23 0000 06/16/23 2226  NA 138   < > 134* 133* 134* 138 141 137  K 4.0   < > 3.1* 3.2* 3.3* 3.2* 3.6 3.3*  CL  95*   < > 95* 95* 99 101 99 94*  CO2 35*   < > 28 26 29 24   --  31  GLUCOSE 97   < >  135* 192* 163* 132*  --  161*  BUN 14   < > 29* 32* 14 25*  --  18  CREATININE 2.78*   < > 2.30* 2.48* 1.79* 2.48* 4.0* 2.29*  CALCIUM 9.5   < > 8.4* 8.4* 8.4* 8.2* 9.8 8.7*  MG  --    < > 1.6*  --  1.6*  --   --  2.3  PHOS  --    < > 3.8 3.8 1.9* 3.4  --   --   TSH 2.78  --   --   --   --   --   --  3.036   < > = values in this interval not displayed.   Liver Function Tests: Recent Labs    04/04/23 0237 04/04/23 1022 04/05/23 0724 04/06/23 0311 04/07/23 0610 06/10/23 0000 06/16/23 2226  AST 29  --   --  28  --   --  33  ALT 16  --   --  20  --   --  33  ALKPHOS 69  --   --  68  --   --  130*  BILITOT 0.4  --   --  0.2*  --   --  0.6  PROT 5.6* 5.6*  --  5.8*  --   --  7.3  ALBUMIN 2.3*  --    < > 2.4* 2.2* 4.2 3.7   < > = values in this interval not displayed.   Recent Labs    02/20/23 0244 06/16/23 2226  LIPASE 53* 60*   No results for input(s): "AMMONIA" in the last 8760 hours. CBC: Recent Labs    04/06/23 0311 04/07/23 0610 06/10/23 0000 06/16/23 2226  WBC 8.7 8.6 7.3 9.4  NEUTROABS 5.2 4.9  --  4.4  HGB 9.6* 9.0* 11.3* 11.2*  HCT 30.0* 28.5* 34* 34.3*  MCV 96.8 97.9  --  95.0  PLT 202 201 142* 174   Lipid Panel: Recent Labs    03/25/23 1604  CHOL 142  HDL 40.40  LDLCALC 70  TRIG 158.0*  CHOLHDL 4   Lab Results  Component Value Date   HGBA1C 6.3 03/25/2023    Procedures since last visit: DG Chest Portable 1 View  Result Date: 06/16/2023 CLINICAL DATA:  Cough. Warm to touch. Tachycardia. Pressure. Chest pain and epigastric pain. Dialysis today. EXAM: PORTABLE CHEST 1 VIEW COMPARISON:  04/04/2023 FINDINGS: Right central venous catheter with tip projecting over the lower right atrium. Shallow inspiration with elevation of the right hemidiaphragm. Cardiac enlargement. No vascular congestion, edema, or consolidation. No pleural effusions. No pneumothorax.  Mediastinal contours appear intact. IMPRESSION: Cardiac enlargement. Elevation of right hemidiaphragm. No active disease. Electronically Signed   By: Burman Nieves M.D.   On: 06/16/2023 23:10   IR Replc Gastro/Colonic Tube Percut W/Fluoro  Result Date: 06/16/2023 INDICATION: 77 year old woman with chronic gastrostomy tube presents to IR for exchange of damaged tube. EXAM: Fluoroscopy guided gastrostomy tube exchange MEDICATIONS: None ANESTHESIA/SEDATION: None CONTRAST:  30 mL of Omnipaque 300-administered into the gastric lumen. FLUOROSCOPY: Radiation Exposure Index (as provided by the fluoroscopic device): 3 mGy Kerma COMPLICATIONS: None immediate. PROCEDURE: Informed written consent was obtained from the patient's/patient representative after a thorough discussion of the procedural risks, benefits and alternatives. All questions were addressed. Maximal Sterile Barrier Technique was utilized including caps, mask, sterile gowns, sterile gloves, sterile drape, hand hygiene and skin antiseptic. A timeout was performed prior to the initiation of the procedure. Contrast administered through  existing 20 [place] gastrostomy tube confirmed appropriate positioning within the gastric lumen. The balloon was deflated and the feeding tube was removed over 0.035 inch guidewire. New 20 French gastrostomy tube was inserted over the guidewire. The balloon was insufflated with 10 mL of normal saline and retracted to the anterior gastric wall. Contrast administered through the new G tube confirmed appropriate positioning within the gastric lumen. It was flushed with normal saline and covered with sterile dressing. IMPRESSION: Successful exchange of percutaneous gastrostomy tube with new 20 [place] G-tube in place. Electronically Signed   By: Acquanetta Belling M.D.   On: 06/16/2023 08:06   IR EXCHANGE BILIARY DRAIN  Result Date: 06/16/2023 INDICATION: 77 year old woman with history of cholecystitis underwent cholecystostomy  drain placement on 12/16/2022. She returns today for routine exchange. EXAM: Cholecystostomy drain exchange MEDICATIONS: None ANESTHESIA/SEDATION: None FLUOROSCOPY: Radiation Exposure Index (as provided by the fluoroscopic device): 3 mGy Kerma COMPLICATIONS: None immediate. PROCEDURE: Informed written consent was obtained from the patient's/patient representative after a thorough discussion of the procedural risks, benefits and alternatives. All questions were addressed. Maximal Sterile Barrier Technique was utilized including caps, mask, sterile gowns, sterile gloves, sterile drape, hand hygiene and skin antiseptic. A timeout was performed prior to the initiation of the procedure. Scout image demonstrated right upper quadrant cholecystostomy drain in appropriate position. Contrast administered through the drain under fluoroscopy showed multiple filling defects consistent with gallstones. The cystic duct remains occluded. The existing 10 French drain was cut and removed over 0.035 inch guidewire and replaced with identical 10 [place] multipurpose pigtail drain. Contrast administered through the new drain confirmed appropriate positioning within the gallbladder lumen. The drain was secured to skin with suture and connected to bag. IMPRESSION: Cholangiogram performed through cholecystostomy drain shows continued obstruction of cystic duct. Drain exchange performed with new 10 French pigtail catheter in place. PLAN: Return in 10 weeks for routine exchange. Electronically Signed   By: Acquanetta Belling M.D.   On: 06/16/2023 08:03    Assessment/Plan  1. Tachycardia -  pulse 105 - carvedilol (COREG) 3.125 MG tablet; Place 1 tablet (3.125 mg total) into feeding tube 2 (two) times daily with a meal. Take 6.25 mg Q AM  and 3.125 mg Q PM on non dialysis days (Tues, Thurs, Sat and Sun,   - On dialysis days,  MWF, continue Coreg 3.125 mg Q PM -  Hold for SBP < 100 -   continue to record BP and HR and bring to next  appointment  2. Dementia due to Alzheimer's disease [facility]) -    Continue supportive care -    Fall precautions  3. ESRD (end stage renal disease) (HCC) -Continue hemodialysis on Eliquis  4. Late effects of CVA (cerebrovascular accident) -   stable -  continue Eliquis  5. Upper respiratory tract infection, unspecified type -  continue Doxycycline for a total of 10 days    Labs/tests ordered:  None  Next appt:  09/28/2023

## 2023-06-25 DIAGNOSIS — N2581 Secondary hyperparathyroidism of renal origin: Secondary | ICD-10-CM | POA: Diagnosis not present

## 2023-06-25 DIAGNOSIS — Z992 Dependence on renal dialysis: Secondary | ICD-10-CM | POA: Diagnosis not present

## 2023-06-25 DIAGNOSIS — N186 End stage renal disease: Secondary | ICD-10-CM | POA: Diagnosis not present

## 2023-06-28 ENCOUNTER — Telehealth: Payer: Self-pay | Admitting: Sports Medicine

## 2023-06-28 DIAGNOSIS — N186 End stage renal disease: Secondary | ICD-10-CM | POA: Diagnosis not present

## 2023-06-28 DIAGNOSIS — N2581 Secondary hyperparathyroidism of renal origin: Secondary | ICD-10-CM | POA: Diagnosis not present

## 2023-06-28 DIAGNOSIS — Z992 Dependence on renal dialysis: Secondary | ICD-10-CM | POA: Diagnosis not present

## 2023-06-28 NOTE — Telephone Encounter (Signed)
[name], MD  Evern Core, [place], [place] minutes ago (10:28 AM)    [facility] for the home health orders      Trey Paula with [facility] Notified and agreed.

## 2023-06-28 NOTE — Telephone Encounter (Signed)
Home Health verbal orders Caller Name:Jeff Agency Name: [facility]  Callback number: [phone], vm secure  Requesting OT/PT/Skilled nursing/Social Work/Speech: Wound care  Reason:Calcium alginate and bordered foam to stage 3 pressure ulcer  Frequency:  Please forward to [facility] pool or providers CMA

## 2023-06-28 NOTE — Telephone Encounter (Signed)
Morrie Sheldon is longer her provider. This will be sent to her new pcp.

## 2023-06-29 ENCOUNTER — Ambulatory Visit: Payer: Medicare PPO | Admitting: Internal Medicine

## 2023-06-29 NOTE — Telephone Encounter (Signed)
Noted  

## 2023-06-30 DIAGNOSIS — Z992 Dependence on renal dialysis: Secondary | ICD-10-CM | POA: Diagnosis not present

## 2023-06-30 DIAGNOSIS — N186 End stage renal disease: Secondary | ICD-10-CM | POA: Diagnosis not present

## 2023-06-30 DIAGNOSIS — N2581 Secondary hyperparathyroidism of renal origin: Secondary | ICD-10-CM | POA: Diagnosis not present

## 2023-07-02 ENCOUNTER — Other Ambulatory Visit: Payer: Self-pay | Admitting: Adult Health

## 2023-07-02 ENCOUNTER — Telehealth: Payer: Self-pay | Admitting: *Deleted

## 2023-07-02 DIAGNOSIS — N2581 Secondary hyperparathyroidism of renal origin: Secondary | ICD-10-CM | POA: Diagnosis not present

## 2023-07-02 DIAGNOSIS — Z992 Dependence on renal dialysis: Secondary | ICD-10-CM | POA: Diagnosis not present

## 2023-07-02 DIAGNOSIS — N186 End stage renal disease: Secondary | ICD-10-CM | POA: Diagnosis not present

## 2023-07-02 DIAGNOSIS — R000 Tachycardia, unspecified: Secondary | ICD-10-CM

## 2023-07-02 MED ORDER — CARVEDILOL 3.125 MG PO TABS
3.1250 mg | ORAL_TABLET | Freq: Two times a day (BID) | ORAL | Status: DC
Start: 2023-07-02 — End: 2023-09-27

## 2023-07-02 NOTE — Telephone Encounter (Signed)
Patient daughter, [name] called and stated that patient was seen on 06/24/2023  Stated that she had concerns with giving the Carvedilol. Stated that she was told to give Carvedilol on MWF 3.125mg . She stated that they have NOT been able to give due to BP being too Low.   Stated that it is hard to even give the Carvedilol twice daily due to being too low. Some days they are ONLY given One tablet a day.   Stated that patient's pulse has been running 99-105.  Please Advise.    NOTE 06/24/2023: 1. Tachycardia -  pulse 105 - carvedilol (COREG) 3.125 MG tablet; Place 1 tablet (3.125 mg total) into feeding tube 2 (two) times daily with a meal. Take 6.25 mg Q AM  and 3.125 mg Q PM on non dialysis days (Tues, Thurs, Sat and Sun,   - On dialysis days,  MWF, continue Coreg 3.125 mg Q PM -  Hold for SBP < 100 -   continue to record BP and HR and bring to next appointment

## 2023-07-02 NOTE — Telephone Encounter (Signed)
[name], Samantha C, NP  You16 minutes ago (2:22 PM)    Pls give Coreg 3.125 mg PO Q PM on hemodialysis days and Coreg 3.125 mg Twice a day. Hold for SBP < 100. Pls log BP and HR and bring to next appointment.      [name], daughter notified and agreed. Daughter is wanting to make sure you are also conversing with patient's Cardiologist also to see what's going on.

## 2023-07-05 DIAGNOSIS — N2581 Secondary hyperparathyroidism of renal origin: Secondary | ICD-10-CM | POA: Diagnosis not present

## 2023-07-05 DIAGNOSIS — Z992 Dependence on renal dialysis: Secondary | ICD-10-CM | POA: Diagnosis not present

## 2023-07-05 DIAGNOSIS — N186 End stage renal disease: Secondary | ICD-10-CM | POA: Diagnosis not present

## 2023-07-05 NOTE — Telephone Encounter (Signed)
[name], Margit Banda, NP  Rollene Rotunda, MD3 days ago    Hello Dr. Antoine Poche,  Daughter wants to let you know what is going on. FYI.  Thanks.  Samantha Medina-Vargas NP

## 2023-07-07 ENCOUNTER — Telehealth: Payer: Self-pay

## 2023-07-07 ENCOUNTER — Telehealth: Payer: Self-pay | Admitting: Sports Medicine

## 2023-07-07 DIAGNOSIS — N2581 Secondary hyperparathyroidism of renal origin: Secondary | ICD-10-CM | POA: Diagnosis not present

## 2023-07-07 DIAGNOSIS — E1122 Type 2 diabetes mellitus with diabetic chronic kidney disease: Secondary | ICD-10-CM

## 2023-07-07 DIAGNOSIS — E1142 Type 2 diabetes mellitus with diabetic polyneuropathy: Secondary | ICD-10-CM

## 2023-07-07 DIAGNOSIS — N186 End stage renal disease: Secondary | ICD-10-CM | POA: Diagnosis not present

## 2023-07-07 DIAGNOSIS — Z992 Dependence on renal dialysis: Secondary | ICD-10-CM | POA: Diagnosis not present

## 2023-07-07 NOTE — Telephone Encounter (Signed)
Made new encounter due to information.  Annabelle Harman from [facility] [phone] would like a call back to discuss some confusion with the verbal orders not being signed. 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  She stated that because it wasn't signed they can't get paid. She accused LaVon of saying that she would not tell Morrie Sheldon that she needs to sign and hung up on her. I assured her that someone would look into this and she understood.

## 2023-07-07 NOTE — Telephone Encounter (Signed)
[name], MD  You3 hours ago (12:49 PM)    Signed the order. FYI  It looks patient was getting it before from her podiatrist in [place]. We might get paper work regarding last date of visit I saw her once for establishing care.   Order and demographic information given to administrative team to fax

## 2023-07-07 NOTE — Telephone Encounter (Signed)
Received a call back from [place] [facility] requesting sign- off on verbal orders for patient,  I informed her Morrie Sheldon could only sign- off on orders received during the time under her care.  Annabelle Harman stated that the orders she has needs to be signed off on or they will not get paid and will have to discharge the patient.  I informed Annabelle Harman the patient has a new PCP and should contact their office.  Annabelle Harman then stated she asked to speak with a manger about the situation I told her that wasn't the message I received and was then disconnected.

## 2023-07-07 NOTE — Telephone Encounter (Signed)
Returned call to [facility] asking for a return call.

## 2023-07-07 NOTE — Telephone Encounter (Signed)
Dana from [place] is wanting to speak with a Production designer, theatre/television/film concerning verbal orders being given, but not signed. [facility] can not get paid by Medicare if the orders are not signed. She is wanting a cb at [phone] to get this cleared up.Marland Kitchen

## 2023-07-07 NOTE — Telephone Encounter (Signed)
Debbie with [facility] [employer] called to request an initial order for determination if patient is a potential candidate for DM shoes and Inserts. The order can be faxed to [phone]. Per Eunice Blase once the order is received, upon review a more detailed order will be sent to [name], MD for completion complete.   Order is pending for review and signature

## 2023-07-09 DIAGNOSIS — Z992 Dependence on renal dialysis: Secondary | ICD-10-CM | POA: Diagnosis not present

## 2023-07-09 DIAGNOSIS — N2581 Secondary hyperparathyroidism of renal origin: Secondary | ICD-10-CM | POA: Diagnosis not present

## 2023-07-09 DIAGNOSIS — N186 End stage renal disease: Secondary | ICD-10-CM | POA: Diagnosis not present

## 2023-07-09 NOTE — Telephone Encounter (Signed)
Spoke with Annabelle Harman with [facility]. She states there have been several verbal orders received from our office for this patient prior to her transfer of care to [facility] on 05/25/23 that need to be signed by Salvatore Decent, NP. Dates that Annabelle Harman states VO were received that need signatures are:  04/12/23  04/18/23 04/19/23 04/27/23 05/14/23 05/17/23 06/24/23  Annabelle Harman says she will resend these via fax. I will convey with PCP and CMA on this. Annabelle Harman also states there were some verbal orders received after the date of 05/25/23 from our office.

## 2023-07-12 DIAGNOSIS — Z992 Dependence on renal dialysis: Secondary | ICD-10-CM | POA: Diagnosis not present

## 2023-07-12 DIAGNOSIS — N186 End stage renal disease: Secondary | ICD-10-CM | POA: Diagnosis not present

## 2023-07-12 DIAGNOSIS — N2581 Secondary hyperparathyroidism of renal origin: Secondary | ICD-10-CM | POA: Diagnosis not present

## 2023-07-13 ENCOUNTER — Encounter: Payer: Self-pay | Admitting: Sports Medicine

## 2023-07-13 ENCOUNTER — Telehealth (INDEPENDENT_AMBULATORY_CARE_PROVIDER_SITE_OTHER): Payer: Medicare HMO | Admitting: Sports Medicine

## 2023-07-13 DIAGNOSIS — L98422 Non-pressure chronic ulcer of back with fat layer exposed: Secondary | ICD-10-CM

## 2023-07-13 DIAGNOSIS — R000 Tachycardia, unspecified: Secondary | ICD-10-CM | POA: Diagnosis not present

## 2023-07-13 NOTE — Progress Notes (Signed)
   This service is provided via telemedicine  No vital signs collected/recorded due to the encounter was a telemedicine visit.   Location of patient (ex: home, work):  Home  Patient consents to a telephone visit:  Yes  Location of the provider (ex: office, home):  [employer]  Name of any referring provider:  [name], MD   Names of all persons participating in the telemedicine service and their role in the encounter:  Patient, Daughter Ezequiel Essex, Meda Klinefelter, RMA, Dr.Prashanthi Veludandi     Time spent on call:  8 minutes spent on the phone with Engineer, site.

## 2023-07-13 NOTE — Progress Notes (Signed)
Careteam: Patient Care Team: [name], MD as PCP - General (Internal Medicine)  PLACE OF SERVICE:  [facility]  Advanced Directive information Does Patient Have a Medical Advance Directive?: Yes, Type of Advance Directive: Living will;Out of facility DNR (pink MOST or yellow form), Does patient want to make changes to medical advance directive?: No - Patient declined  Allergies  Allergen Reactions   Aspirin Anaphylaxis   Penicillins Anaphylaxis    **Tolerates cephalosporins    Chief Complaint  Patient presents with   Follow-up    2 week follow up.                                                           Video visit                                                      Start time  2.50                                                        End time    3.10   HPI: Patient is a 77 y.o. female is evaluated for follow up by video virtual visit    112/ 76  Daughter has been checking her bp at home daily  Pulse rate ranging from 80- 95 Pt is on dialysis  Daughter denies having low bp or high heart rate during her dialysis  Denies chest pain, SOB,   Pt scheduled to see her cardiology dec 12 th  On eliquis  She is taking coreg 6.25 mg bid   DM  BG running around 165- 185  Denies hypoglycemia   [place]  Pt has HH and wound care nurse  Using zinc oxide , foam dressing   Pt opens her eyes when called her name HH currently with the patient, looked at her wound on her back, its about 2x1 cm on both her buttocks with no exudates  Review of Systems:  Review of Systems  Unable to perform ROS: Dementia  Constitutional:  Negative for chills and fever.  Respiratory:  Negative for cough.   Cardiovascular:  Negative for chest pain.  Gastrointestinal:  Negative for abdominal pain and blood in stool.    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   Dementia (HCC)    Diabetes (HCC)     Diabetes mellitus without complication (HCC)    ESRD (end stage renal disease) on dialysis (HCC)    Heart failure (HCC)    History of CT scan    History of heart attack    History of MRI    History of stroke    HTN (hypertension)    Kidney failure    PEG (percutaneous endoscopic gastrostomy) status (HCC)    Thyroid disease    Past Surgical History:  Procedure Laterality Date   ABDOMINAL HYSTERECTOMY     GALLBLADDER SURGERY  IR EXCHANGE BILIARY DRAIN  02/11/2023   IR EXCHANGE BILIARY DRAIN  04/13/2023   IR EXCHANGE BILIARY DRAIN  06/15/2023   IR PERC CHOLECYSTOSTOMY  12/16/2022   IR REPLC GASTRO/COLONIC TUBE PERCUT W/FLUORO  06/15/2023   Social History:   reports that she has never smoked. She does not have any smokeless tobacco history on file. She reports that she does not currently use alcohol. She reports that she does not use drugs.  Family History  Problem Relation Age of Onset   Diabetes Mother     Medications: Patient's Medications  New Prescriptions   No medications on file  Previous Medications   ACETAMINOPHEN (TYLENOL) 500 MG TABLET    Place 500 mg into feeding tube every 6 (six) hours as needed for mild pain.   APIXABAN (ELIQUIS) 5 MG TABS TABLET    Place 1 tablet (5 mg total) into feeding tube 2 (two) times daily.   CARVEDILOL (COREG) 3.125 MG TABLET    Place 1 tablet (3.125 mg total) into feeding tube 2 (two) times daily with a meal. Take 3.125 mg Q AM  and 3.125 mg Q PM on non dialysis days (Tues, Thurs, Sat and Sun,   - On dialysis days,  MWF, continue Coreg 3.125 mg Q PM -  Hold for SBP < 100   CONTINUOUS GLUCOSE RECEIVER (DEXCOM G7 RECEIVER) DEVI    Check blood sugar before meals and at bedtime   CONTINUOUS GLUCOSE SENSOR (DEXCOM G7 SENSOR) MISC    Apply to back of arm. Change sensor every 10 days.   GLUCAGON (BAQSIMI ONE PACK) 3 MG/DOSE POWD    Place 1 packet into the nose as needed (For blood sugar). Use when blood sugar drops below 70.   INSULIN  ASPART (NOVOLOG) 100 UNIT/ML INJECTION    0-9 Units, Subcutaneous, 3 times daily with meals, CBG < 70: Implement Hypoglycemia measures CBG 70 - 120: 0 units CBG 121 - 150: 1 unit CBG 151 - 200: 2 units CBG 201 - 250: 3 units CBG 251 - 300: 5 units CBG 301 - 350: 7 units CBG 351 - 400: 9 units CBG > 400: call MD   INSULIN GLARGINE (LANTUS) 100 UNIT/ML SOLOSTAR PEN    Inject 20 Units into the skin at bedtime.   INSULIN PEN NEEDLE 32G X 4 MM MISC    Use to inject insulin 4 times a day for blood sugar management.   MULTIVITAMIN (RENA-VIT) TABS TABLET    Take 1 tablet by mouth at bedtime.   NUTRITIONAL SUPPLEMENTS (FEEDING SUPPLEMENT, NEPRO CARB STEADY,) LIQD    Allow Nepro @ 50 ml/hr to run from 8pm-6am (10 hrs total)   SENNA (SENOKOT) 8.6 MG TABLET    Administer 2 tablets into g-tube once daily   VITAMIN B-12 (CYANOCOBALAMIN) 50 MCG TABLET    Take 50 mcg by mouth daily.  Modified Medications   No medications on file  Discontinued Medications   DOXYCYCLINE (VIBRAMYCIN) 100 MG CAPSULE    Take 1 capsule (100 mg total) by mouth 2 (two) times daily.    Physical Exam:  There were no vitals filed for this visit. There is no height or weight on file to calculate BMI. Wt Readings from Last 3 Encounters:  06/24/23 129 lb (58.5 kg)  05/25/23 128 lb 12 oz (58.4 kg)  04/07/23 127 lb 3.3 oz (57.7 kg)       Labs reviewed: Basic Metabolic Panel: Recent Labs    03/25/23 1604 03/31/23 0050 04/05/23 0118  04/05/23 0724 04/06/23 0311 04/07/23 0610 05/26/23 0000 06/10/23 0000 06/16/23 2226  NA 138   < > 134* 133* 134* 138 4* 141 137  K 4.0   < > 3.1* 3.2* 3.3* 3.2*  --  3.6 3.3*  CL 95*   < > 95* 95* 99 101  --  99 94*  CO2 35*   < > 28 26 29 24   --   --  31  GLUCOSE 97   < > 135* 192* 163* 132*  --   --  161*  BUN 14   < > 29* 32* 14 25* 78*  --  18  CREATININE 2.78*   < > 2.30* 2.48* 1.79* 2.48*  --  4.0* 2.29*  CALCIUM 9.5   < > 8.4* 8.4* 8.4* 8.2*  --  9.8 8.7*  MG  --    < > 1.6*  --  1.6*   --   --   --  2.3  PHOS  --    < > 3.8 3.8 1.9* 3.4  --   --   --   TSH 2.78  --   --   --   --   --   --   --  3.036   < > = values in this interval not displayed.   Liver Function Tests: Recent Labs    04/04/23 0237 04/04/23 1022 04/05/23 0724 04/06/23 0311 04/07/23 0610 06/10/23 0000 06/16/23 2226  AST 29  --   --  28  --   --  33  ALT 16  --   --  20  --   --  33  ALKPHOS 69  --   --  68  --   --  130*  BILITOT 0.4  --   --  0.2*  --   --  0.6  PROT 5.6* 5.6*  --  5.8*  --   --  7.3  ALBUMIN 2.3*  --    < > 2.4* 2.2* 4.2 3.7   < > = values in this interval not displayed.   Recent Labs    02/20/23 0244 06/16/23 2226  LIPASE 53* 60*   No results for input(s): "AMMONIA" in the last 8760 hours. CBC: Recent Labs    04/06/23 0311 04/07/23 0610 05/26/23 0000 06/10/23 0000 06/16/23 2226  WBC 8.7 8.6  --  7.3 9.4  NEUTROABS 5.2 4.9  --   --  4.4  HGB 9.6* 9.0* 11.9* 11.3* 11.2*  HCT 30.0* 28.5*  --  34* 34.3*  MCV 96.8 97.9  --   --  95.0  PLT 202 201  --  142* 174   Lipid Panel: Recent Labs    03/25/23 1604  CHOL 142  HDL 40.40  LDLCALC 70  TRIG 158.0*  CHOLHDL 4   TSH: Recent Labs    03/25/23 1604 06/16/23 2226  TSH 2.78 3.036   A1C: Lab Results  Component Value Date   HGBA1C 6.3 03/25/2023     Assessment/Plan  1. Tachycardia Pt denies chest pain, palpitations Cont with coreg  2. Skin ulcer of sacrum with fat layer exposed (HCC) Frequent repositioning  Cont with zinc oxide, med honey foam dressing  No follow-ups on file.:

## 2023-07-14 ENCOUNTER — Other Ambulatory Visit: Payer: Self-pay | Admitting: Internal Medicine

## 2023-07-14 DIAGNOSIS — N186 End stage renal disease: Secondary | ICD-10-CM | POA: Diagnosis not present

## 2023-07-14 DIAGNOSIS — Z992 Dependence on renal dialysis: Secondary | ICD-10-CM | POA: Diagnosis not present

## 2023-07-14 DIAGNOSIS — R627 Adult failure to thrive: Secondary | ICD-10-CM

## 2023-07-14 DIAGNOSIS — N2581 Secondary hyperparathyroidism of renal origin: Secondary | ICD-10-CM | POA: Diagnosis not present

## 2023-07-16 ENCOUNTER — Telehealth: Payer: Self-pay

## 2023-07-16 DIAGNOSIS — Z431 Encounter for attention to gastrostomy: Secondary | ICD-10-CM | POA: Diagnosis not present

## 2023-07-16 DIAGNOSIS — I222 Subsequent non-ST elevation (NSTEMI) myocardial infarction: Secondary | ICD-10-CM | POA: Diagnosis not present

## 2023-07-16 DIAGNOSIS — M6281 Muscle weakness (generalized): Secondary | ICD-10-CM | POA: Diagnosis not present

## 2023-07-16 DIAGNOSIS — R627 Adult failure to thrive: Secondary | ICD-10-CM | POA: Diagnosis not present

## 2023-07-16 DIAGNOSIS — E46 Unspecified protein-calorie malnutrition: Secondary | ICD-10-CM | POA: Diagnosis not present

## 2023-07-16 DIAGNOSIS — Z992 Dependence on renal dialysis: Secondary | ICD-10-CM | POA: Diagnosis not present

## 2023-07-16 DIAGNOSIS — Z931 Gastrostomy status: Secondary | ICD-10-CM | POA: Diagnosis not present

## 2023-07-16 DIAGNOSIS — E7849 Other hyperlipidemia: Secondary | ICD-10-CM | POA: Diagnosis not present

## 2023-07-16 DIAGNOSIS — R2681 Unsteadiness on feet: Secondary | ICD-10-CM | POA: Diagnosis not present

## 2023-07-16 DIAGNOSIS — K810 Acute cholecystitis: Secondary | ICD-10-CM | POA: Diagnosis not present

## 2023-07-16 DIAGNOSIS — Z9049 Acquired absence of other specified parts of digestive tract: Secondary | ICD-10-CM | POA: Diagnosis not present

## 2023-07-16 DIAGNOSIS — N186 End stage renal disease: Secondary | ICD-10-CM | POA: Diagnosis not present

## 2023-07-16 DIAGNOSIS — R278 Other lack of coordination: Secondary | ICD-10-CM | POA: Diagnosis not present

## 2023-07-16 DIAGNOSIS — R1312 Dysphagia, oropharyngeal phase: Secondary | ICD-10-CM | POA: Diagnosis not present

## 2023-07-16 DIAGNOSIS — N2581 Secondary hyperparathyroidism of renal origin: Secondary | ICD-10-CM | POA: Diagnosis not present

## 2023-07-16 NOTE — Telephone Encounter (Signed)
Miracle,RN w/ [facility] called to inform Dr. Jacquenette Shone patient wound has not improved due to possible not cleaned properly. Miracle states that they will go twice a week for wound care and the family was advised to properly treatment/ care for the patients wound once daily. Miracle states if Dr. Jacquenette Shone needs to reach out to her about the care her contact information is [phone].

## 2023-07-18 DIAGNOSIS — N186 End stage renal disease: Secondary | ICD-10-CM | POA: Diagnosis not present

## 2023-07-18 DIAGNOSIS — N2581 Secondary hyperparathyroidism of renal origin: Secondary | ICD-10-CM | POA: Diagnosis not present

## 2023-07-18 DIAGNOSIS — Z992 Dependence on renal dialysis: Secondary | ICD-10-CM | POA: Diagnosis not present

## 2023-07-20 DIAGNOSIS — N2581 Secondary hyperparathyroidism of renal origin: Secondary | ICD-10-CM | POA: Diagnosis not present

## 2023-07-20 DIAGNOSIS — N186 End stage renal disease: Secondary | ICD-10-CM | POA: Diagnosis not present

## 2023-07-20 DIAGNOSIS — Z992 Dependence on renal dialysis: Secondary | ICD-10-CM | POA: Diagnosis not present

## 2023-07-23 DIAGNOSIS — N186 End stage renal disease: Secondary | ICD-10-CM | POA: Diagnosis not present

## 2023-07-23 DIAGNOSIS — Z992 Dependence on renal dialysis: Secondary | ICD-10-CM | POA: Diagnosis not present

## 2023-07-23 DIAGNOSIS — N2581 Secondary hyperparathyroidism of renal origin: Secondary | ICD-10-CM | POA: Diagnosis not present

## 2023-07-24 DIAGNOSIS — Z992 Dependence on renal dialysis: Secondary | ICD-10-CM | POA: Diagnosis not present

## 2023-07-24 DIAGNOSIS — N186 End stage renal disease: Secondary | ICD-10-CM | POA: Diagnosis not present

## 2023-07-24 DIAGNOSIS — R1312 Dysphagia, oropharyngeal phase: Secondary | ICD-10-CM | POA: Diagnosis not present

## 2023-07-24 DIAGNOSIS — Z931 Gastrostomy status: Secondary | ICD-10-CM | POA: Diagnosis not present

## 2023-07-24 DIAGNOSIS — E1122 Type 2 diabetes mellitus with diabetic chronic kidney disease: Secondary | ICD-10-CM | POA: Diagnosis not present

## 2023-07-24 DIAGNOSIS — E7849 Other hyperlipidemia: Secondary | ICD-10-CM | POA: Diagnosis not present

## 2023-07-24 DIAGNOSIS — I222 Subsequent non-ST elevation (NSTEMI) myocardial infarction: Secondary | ICD-10-CM | POA: Diagnosis not present

## 2023-07-26 ENCOUNTER — Telehealth: Payer: Self-pay | Admitting: *Deleted

## 2023-07-26 DIAGNOSIS — I222 Subsequent non-ST elevation (NSTEMI) myocardial infarction: Secondary | ICD-10-CM | POA: Diagnosis not present

## 2023-07-26 DIAGNOSIS — R1312 Dysphagia, oropharyngeal phase: Secondary | ICD-10-CM | POA: Diagnosis not present

## 2023-07-26 DIAGNOSIS — Z931 Gastrostomy status: Secondary | ICD-10-CM | POA: Diagnosis not present

## 2023-07-26 DIAGNOSIS — N2581 Secondary hyperparathyroidism of renal origin: Secondary | ICD-10-CM | POA: Diagnosis not present

## 2023-07-26 DIAGNOSIS — E7849 Other hyperlipidemia: Secondary | ICD-10-CM | POA: Diagnosis not present

## 2023-07-26 DIAGNOSIS — N186 End stage renal disease: Secondary | ICD-10-CM | POA: Diagnosis not present

## 2023-07-26 DIAGNOSIS — Z992 Dependence on renal dialysis: Secondary | ICD-10-CM | POA: Diagnosis not present

## 2023-07-26 NOTE — Telephone Encounter (Signed)
[name], daughter called and stated that patient is having a lot of Gas. Noticed this last night into today. No other symptoms noted.  Has not tried anything OTC. Daughter is wanting your recommendation.   Please Advise.

## 2023-07-27 ENCOUNTER — Telehealth (HOSPITAL_COMMUNITY): Payer: Self-pay

## 2023-07-27 ENCOUNTER — Encounter (HOSPITAL_COMMUNITY): Payer: Self-pay | Admitting: Interventional Radiology

## 2023-07-27 DIAGNOSIS — Z931 Gastrostomy status: Secondary | ICD-10-CM

## 2023-07-27 DIAGNOSIS — N186 End stage renal disease: Secondary | ICD-10-CM

## 2023-07-27 NOTE — Telephone Encounter (Signed)
Returned call regarding peg, no answer. AB

## 2023-07-27 NOTE — Telephone Encounter (Signed)
[name], MD  You9 minutes ago (9:47 AM)    They can try simethicone otc 1 tab q8 prn      Tried calling patient. LMOM to return call.

## 2023-07-28 DIAGNOSIS — N2581 Secondary hyperparathyroidism of renal origin: Secondary | ICD-10-CM | POA: Diagnosis not present

## 2023-07-28 DIAGNOSIS — Z992 Dependence on renal dialysis: Secondary | ICD-10-CM | POA: Diagnosis not present

## 2023-07-28 DIAGNOSIS — N186 End stage renal disease: Secondary | ICD-10-CM | POA: Diagnosis not present

## 2023-07-30 ENCOUNTER — Telehealth: Payer: Self-pay | Admitting: Sports Medicine

## 2023-07-30 DIAGNOSIS — N186 End stage renal disease: Secondary | ICD-10-CM | POA: Diagnosis not present

## 2023-07-30 DIAGNOSIS — Z992 Dependence on renal dialysis: Secondary | ICD-10-CM | POA: Diagnosis not present

## 2023-07-30 DIAGNOSIS — R140 Abdominal distension (gaseous): Secondary | ICD-10-CM

## 2023-07-30 DIAGNOSIS — N2581 Secondary hyperparathyroidism of renal origin: Secondary | ICD-10-CM | POA: Diagnosis not present

## 2023-07-30 NOTE — Telephone Encounter (Signed)
Patient daughter has already been notified.

## 2023-07-30 NOTE — Telephone Encounter (Signed)
Patient's daughter called to say that the referral to [facility] needs to be redone. [facility] is saying that they don't do anything pertaining to feeding tubes, but the daughter says she only wants her mom evaluated by GI for other abdominal issues, not the feeding tube. Please submit a new referral to [facility] for the reason of evaluation of GI tract, per daughter's request.

## 2023-08-02 ENCOUNTER — Telehealth: Payer: Self-pay | Admitting: Internal Medicine

## 2023-08-02 DIAGNOSIS — N2581 Secondary hyperparathyroidism of renal origin: Secondary | ICD-10-CM | POA: Diagnosis not present

## 2023-08-02 DIAGNOSIS — Z992 Dependence on renal dialysis: Secondary | ICD-10-CM | POA: Diagnosis not present

## 2023-08-02 DIAGNOSIS — N186 End stage renal disease: Secondary | ICD-10-CM | POA: Diagnosis not present

## 2023-08-02 NOTE — Telephone Encounter (Signed)
We do not manage G-tubes so I think she would be better served by seeing [facility] for her care. Thanks.

## 2023-08-02 NOTE — Telephone Encounter (Signed)
Good morning Dr. Leonides Schanz,    Supervising Provider AM 08/02/2023    We received a referral for patient for bloating. Patient was last seen in [place] in 2023. Patient now lives locally and was referred by her primary care provider. Patient previous records are in [facility] for you to review and advise on scheduling.    Thank you.

## 2023-08-04 DIAGNOSIS — N186 End stage renal disease: Secondary | ICD-10-CM | POA: Diagnosis not present

## 2023-08-04 DIAGNOSIS — Z992 Dependence on renal dialysis: Secondary | ICD-10-CM | POA: Diagnosis not present

## 2023-08-04 DIAGNOSIS — N2581 Secondary hyperparathyroidism of renal origin: Secondary | ICD-10-CM | POA: Diagnosis not present

## 2023-08-04 NOTE — Telephone Encounter (Signed)
Left detailed voicemail with previous recommendations.

## 2023-08-04 NOTE — Progress Notes (Unsigned)
Cardiology Office Note:   Date:  08/05/2023  ID:  Samantha [name], DOB 1946/02/27, MRN [id number] PCP: [name], MD  [facility] Providers Cardiologist:  None {  History of Present Illness:   Samantha [name] is a 77 y.o. 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  She was in the ED for acute cholecystitis.  She has an EF of 45%.  She had basal-mid lateral wall and inferolateral wall hypokinesis.   At the first visit I did not have a list of meds or previous records.   I did receive some records eventually.  She had an echocardiogram at [facility].  This was 2021.  EF was 35 to 40%.  There were wall motion abnormalities as above.  There is moderate mitral regurgitation.    She was in the hospital in 2021 in [place] and was managed for TTP.  I do see mention of congestive heart failure.  They mention a 2009 stress perfusion study that was negative for any evidence of ischemia.  Apparently her heart failure dates back to then.   Of note her daughters say that in 2021 she did have a cardiac cath.  There was mention of maybe doing a cardiac stent but since that event so much going on she was managed medically.  Apparently her ejection fraction was low enough that she was sent home with a LifeVest for about a year.  However, her daughter said her ejection fraction eventually improved to 50%.    She returns for follow up.  She is in a wheelchair following a stroke.  She has a PEG tube for feeding.  She actually has an indwelling tube draining her gallbladder because it is barely been infection.  Despite all of this she does relatively well.  She is not having any new shortness of breath, PND or orthopnea.  She has had no new palpitations, presyncope or syncope.  She answers yes/no questions but she is very quiet.  Her daughter is with her as well as a caregiver.   ROS: As stated in the HPI and negative for all other systems.  Studies Reviewed:    EKG:    NA  Risk Assessment/Calculations:              Physical Exam:   VS:  BP (!) 87/59 (BP Location: Right Arm, Patient Position: Sitting, Cuff Size: Large)   Pulse 88   Resp 16   Ht 4\' 11"  (1.499 m)   Wt 127 lb (57.6 kg) Comment: stated weight. pt in [facility] and unable to stand.  SpO2 100%   BMI 25.65 kg/m    Wt Readings from Last 3 Encounters:  08/05/23 127 lb (57.6 kg)  06/24/23 129 lb (58.5 kg)  05/25/23 128 lb 12 oz (58.4 kg)     GEN: Well nourished, well developed in no acute distress NECK: No JVD; No carotid bruits CARDIAC: RRR, 2 out of 6 apical and axillary holosystolic murmur murmurs, rubs, gallops RESPIRATORY:  Clear to auscultation without rales, wheezing or rhonchi  ABDOMEN: Soft, non-tender, non-distended EXTREMITIES:  No edema; No deformity   ASSESSMENT AND PLAN:   Cardiomyopathy: EF was 40 to 45% in April.  This looks like it is stable from previous.  There was a distant ischemia workup and it was negative.  She is not having any new symptoms.  Her volume is managed with dialysis.  She does not tolerate med titration with her low blood pressure but she is  tolerating those very low-dose of carvedilol.  No change in therapy.   HTN: Her blood pressure is low.  No med titration.  DVT:   .  No change in therapy.  .  Mitral regurgitation: This was mild on follow-up echo.  I will follow this clinically.    CAD: Her daughters will get me her records from [place] where she apparently had a diagnosis of coronary disease.  She is not having any symptoms.  No change in therapy at this point.  If we establish that she had coronary disease we could consider a statin in the future.     Follow up with APP in six months.   Signed, Rollene Rotunda, MD

## 2023-08-05 ENCOUNTER — Ambulatory Visit: Payer: Medicare HMO | Attending: Cardiology | Admitting: Cardiology

## 2023-08-05 ENCOUNTER — Telehealth: Payer: Self-pay | Admitting: Sports Medicine

## 2023-08-05 ENCOUNTER — Encounter: Payer: Self-pay | Admitting: Cardiology

## 2023-08-05 VITALS — BP 87/59 | HR 88 | Resp 16 | Ht 59.0 in | Wt 127.0 lb

## 2023-08-05 DIAGNOSIS — I429 Cardiomyopathy, unspecified: Secondary | ICD-10-CM | POA: Diagnosis not present

## 2023-08-05 DIAGNOSIS — I10 Essential (primary) hypertension: Secondary | ICD-10-CM

## 2023-08-05 NOTE — Patient Instructions (Signed)
 Medication Instructions:  No changes.  *If you need a refill on your cardiac medications before your next appointment, please call your pharmacy*    Follow-Up: At [facility], you and your health needs are our priority.  As part of our continuing mission to provide you with exceptional heart care, we have created designated Provider Care Teams.  These Care Teams include your primary Cardiologist (physician) and Advanced Practice Providers (APPs -  Physician Assistants and Nurse Practitioners) who all work together to provide you with the care you need, when you need it.  We recommend signing up for the patient portal called "MyChart".  Sign up information is provided on this After Visit Summary.  MyChart is used to connect with patients for Virtual Visits (Telemedicine).  Patients are able to view lab/test results, encounter notes, upcoming appointments, etc.  Non-urgent messages can be sent to your provider as well.   To learn more about what you can do with MyChart, go to [email or web address].    Your next appointment:   6 month(s)  Provider:   Marjie Skiff, PA-C

## 2023-08-05 NOTE — Telephone Encounter (Signed)
Patient's daughter, [name], called to say the patient has an appointment at [facility] on 08/23/23 regarding her G-tube, and they told her that you needed to write an order stating that the G-tube needs to be reviewed and, if needed, replaced. They cannot do this without your documentation.

## 2023-08-06 ENCOUNTER — Telehealth: Payer: Self-pay | Admitting: Cardiology

## 2023-08-06 DIAGNOSIS — N186 End stage renal disease: Secondary | ICD-10-CM | POA: Diagnosis not present

## 2023-08-06 DIAGNOSIS — N2581 Secondary hyperparathyroidism of renal origin: Secondary | ICD-10-CM | POA: Diagnosis not present

## 2023-08-06 DIAGNOSIS — Z992 Dependence on renal dialysis: Secondary | ICD-10-CM | POA: Diagnosis not present

## 2023-08-06 NOTE — Telephone Encounter (Signed)
Caller Mardene Speak) stated patient's daughter-[name] stated the form she received was not the form to request release of medical records from patient's previous cardiologist.  Caller wants a call back to [facility] at phone# [phone].

## 2023-08-06 NOTE — Telephone Encounter (Signed)
Patient identification verified by 2 forms. Marilynn Rail, RN    Called and spoke to patient  Patient states:   -fax was sent today via fedex  Informed patient:   -At this time fax has not been received, will continue to check  Patient states she will call Monday to follow up, no further questions at this time

## 2023-08-06 NOTE — Telephone Encounter (Signed)
Daughter [name]) want to get form to have patient's record from her previous cardiologist released to Dr. Antoine Poche.  Daughter wants form emailed to [email or web address].

## 2023-08-06 NOTE — Telephone Encounter (Signed)
Patient is calling because she sent [place] fax today (see 08/06/23 encounter) and would like to make sure we received it. Please advise.

## 2023-08-06 NOTE — Telephone Encounter (Signed)
Patient identification verified by 2 forms. Marilynn Rail, RN    Called and spoke to patient daughter Trilby Drummer states:   -needs to complete form so Dr. Antoine Poche office can request records from previous cardiologist   -form can be emailed to [email or web address] Informed [name]:   -Records sent to requested email   -can fax completed document to [phone]  -please send mychart message when fax is complete  [name] agrees with plan, no further questions at this time

## 2023-08-06 NOTE — Telephone Encounter (Signed)
Patient identification verified by 2 forms. Marilynn Rail, RN    Called and spoke to [employer] states:   -from she received is incorrect  Informed [name]:   -RN spoke to medical records department, department confirmed the form is correct   -to complete form with previous doctors information with request form records to be sent to Dr. Antoine Poche office  [name] verbalized understanding, no questions at this time

## 2023-08-09 ENCOUNTER — Encounter: Payer: Self-pay | Admitting: Obstetrics and Gynecology

## 2023-08-09 DIAGNOSIS — Z992 Dependence on renal dialysis: Secondary | ICD-10-CM | POA: Diagnosis not present

## 2023-08-09 DIAGNOSIS — N186 End stage renal disease: Secondary | ICD-10-CM | POA: Diagnosis not present

## 2023-08-09 DIAGNOSIS — N2581 Secondary hyperparathyroidism of renal origin: Secondary | ICD-10-CM | POA: Diagnosis not present

## 2023-08-10 ENCOUNTER — Encounter: Payer: Medicare HMO | Admitting: Obstetrics and Gynecology

## 2023-08-10 ENCOUNTER — Ambulatory Visit (INDEPENDENT_AMBULATORY_CARE_PROVIDER_SITE_OTHER): Payer: Medicare HMO | Admitting: Obstetrics and Gynecology

## 2023-08-10 ENCOUNTER — Ambulatory Visit: Payer: Medicare HMO | Admitting: Obstetrics and Gynecology

## 2023-08-10 ENCOUNTER — Encounter: Payer: Self-pay | Admitting: Obstetrics and Gynecology

## 2023-08-10 ENCOUNTER — Other Ambulatory Visit: Payer: Self-pay

## 2023-08-10 DIAGNOSIS — Z01419 Encounter for gynecological examination (general) (routine) without abnormal findings: Secondary | ICD-10-CM | POA: Insufficient documentation

## 2023-08-10 DIAGNOSIS — F0280 Dementia in other diseases classified elsewhere without behavioral disturbance: Secondary | ICD-10-CM

## 2023-08-10 DIAGNOSIS — N186 End stage renal disease: Secondary | ICD-10-CM

## 2023-08-10 DIAGNOSIS — Z931 Gastrostomy status: Secondary | ICD-10-CM

## 2023-08-10 NOTE — Telephone Encounter (Signed)
Outgoing call placed to [facility]:  [facility] Department Contact Information  Phone Fax Address  [phone] [address] [place] [address]  Specialty  Radiology  I spoke with Feliz Beam to find out what is needed from [place] for patient to have this appointment and he stated nothing is need, I repeated back to confirm.    I returned call to [facility] [place] and relayed the interaction between [place] and I by leaving a detailed voicemail.

## 2023-08-10 NOTE — Telephone Encounter (Signed)
Patients daughter returned call and stated that she spoke with the nurse at [facility] last week and was told that they need an order for the PEG Tube evaluation as patient is only scheduled for G-Tube replacement at present. [name] highlighted that a piece of the PEG Tube broke and that is the reason for additional order request.   Order was placed with cosign required option.

## 2023-08-10 NOTE — Telephone Encounter (Signed)
Providers reply that was sent to me instead of the medical assistant covering clinical intake  or the entire clinical pool:  [name], MD  You4 days ago     Hi Chrae, can you check what they need exactly.  When I place a referral , I put the information that pt is on Peg tube and needs evaluation, I don't understand what do they mean by order?

## 2023-08-10 NOTE — Progress Notes (Signed)
77 y.o. 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 Presents with her daughter. Uses lift for transfers at home.  Appetite has decreased significantly over the past month. Also notes more agitation. Has neurology follow-up scheduled. Denies any urinary issues. Very little urine production. Dialysis 3d/wk. Incontinent of bladder and bowel. Vaginal odor back in August.  Hysterectomy was completed for fibroids. Had recent cholecystostomy  Postmenopausal bleeding: none Pelvic discharge or pain: none Breast mass, nipple discharge or skin changes : none Last PAP: unsure Last mammogram: 3-5 years in [place]  Last colonoscopy: [place] Last DXA: never Sexually active: no  Exercising: no  GYN HISTORY: Prior Ab hysterectomy  OB History  No obstetric history on file.    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   Dementia (HCC)    Diabetes (HCC)    Diabetes mellitus without complication (HCC)    ESRD (end stage renal disease) on dialysis (HCC)    Heart failure (HCC)    History of CT scan    History of heart attack    History of MRI    History of stroke    HTN (hypertension)    Kidney failure    PEG (percutaneous endoscopic gastrostomy) status (HCC)    Thyroid disease     Past Surgical History:  Procedure Laterality Date   ABDOMINAL HYSTERECTOMY     GALLBLADDER SURGERY     IR EXCHANGE BILIARY DRAIN  02/11/2023   IR EXCHANGE BILIARY DRAIN  04/13/2023   IR EXCHANGE BILIARY DRAIN  06/15/2023   IR PERC CHOLECYSTOSTOMY  12/16/2022   IR REPLC GASTRO/COLONIC TUBE PERCUT W/FLUORO  06/15/2023    Current Outpatient Medications on File Prior to Visit  Medication Sig Dispense Refill   acetaminophen (TYLENOL) 500 MG tablet Place 500 mg into feeding tube every 6 (six) hours as needed for mild pain.      apixaban (ELIQUIS) 5 MG TABS tablet Place 1 tablet (5 mg total) into feeding tube 2 (two) times daily. 180 tablet 1   carvedilol (COREG) 3.125 MG tablet Place 1 tablet (3.125 mg total) into feeding tube 2 (two) times daily with a meal. Take 3.125 mg Q AM  and 3.125 mg Q PM on non dialysis days (Tues, Thurs, Sat and Sun,   - On dialysis days,  MWF, continue Coreg 3.125 mg Q PM -  Hold for SBP < 100     Continuous Glucose Receiver (DEXCOM G7 RECEIVER) DEVI Check blood sugar before meals and at bedtime 1 each 0   Continuous Glucose Sensor (DEXCOM G7 SENSOR) MISC Apply to back of arm. Change sensor every 10 days. 9 each 3   Glucagon (BAQSIMI ONE PACK) 3 MG/DOSE POWD Place 1 packet into the nose as needed (For blood sugar). Use when blood sugar drops below 70.     insulin aspart (NOVOLOG) 100 UNIT/ML injection 0-9 Units, Subcutaneous, 3 times daily with meals, CBG < 70: Implement Hypoglycemia measures CBG 70 - 120: 0 units CBG 121 - 150: 1 unit CBG 151 - 200: 2 units CBG 201 - 250: 3 units CBG 251 - 300: 5 units CBG 301 - 350: 7 units CBG 351 - 400: 9 units CBG > 400: call MD 10 mL 11   insulin glargine (LANTUS) 100 UNIT/ML Solostar Pen Inject 20 Units into the skin at bedtime.  Insulin Pen Needle 32G X 4 MM MISC Use to inject insulin 4 times a day for blood sugar management. 200 each 3   multivitamin (RENA-VIT) TABS tablet Take 1 tablet by mouth at bedtime.  0   Nutritional Supplements (FEEDING SUPPLEMENT, NEPRO CARB STEADY,) LIQD Allow Nepro @ 50 ml/hr to run from 8pm-6am (10 hrs total) (Patient taking differently: Allow Nepro @ 50 ml/hr to run from 8pm-8am (12 hrs total). Use of water to flush g-tube before and after tube feeding.) 15000 mL 1   senna (SENOKOT) 8.6 MG tablet Administer 2 tablets into g-tube once daily 180 tablet 1   vitamin B-12 (CYANOCOBALAMIN) 50 MCG tablet Take 50 mcg by mouth daily.     No current facility-administered medications on file prior to visit.    Social History    Socioeconomic History   Marital status: Single    Spouse name: Not on file   Number of children: Not on file   Years of education: Not on file   Highest education level: Some college, no degree  Occupational History   Not on file  Tobacco Use   Smoking status: Never   Smokeless tobacco: Not on file  Substance and Sexual Activity   Alcohol use: Not Currently   Drug use: Never   Sexual activity: Not Currently  Other Topics Concern   Not on file  Social History Narrative   Tobacco use, amount per day now: N/A   Past tobacco use, amount per day: N/A   How many years did you use tobacco: N/A   Alcohol use (drinks per week): N/A   Diet: N/A   Do you drink/eat things with caffeine: N/A   Marital status:  Widow                                What year were you married? Yes   Do you live in a house, apartment, assisted living, condo, trailer, etc.? House    Is it one or more stories? One story    How many persons live in your home? 2    Do you have pets in your home?( please list) No   Highest Level of education completed? High School    Current or past profession: Environmental health practitioner    Do you exercise?    N/A                              Type and how often?   Do you have a living will? No   Do you have a DNR form?    Yes                               If not, do you want to discuss one?   Do you have signed POA/HPOA forms?    No                    If so, please bring to you appointment      Do you have any difficulty bathing or dressing yourself? Yes   Do you have any difficulty preparing food or eating? Yes   Do you have any difficulty managing your medications? Yes   Do you have any difficulty managing your finances? Yes   Do you have any difficulty affording your medications? Yes  Social Drivers of Corporate investment banker Strain: Low Risk  (06/20/2023)   Overall Financial Resource Strain (CARDIA)    Difficulty of Paying Living Expenses: Not hard at all  Food  Insecurity: No Food Insecurity (06/20/2023)   Hunger Vital Sign    Worried About Running Out of Food in the Last Year: Never true    Ran Out of Food in the Last Year: Never true  Transportation Needs: No Transportation Needs (06/20/2023)   PRAPARE - Administrator, Civil Service (Medical): No    Lack of Transportation (Non-Medical): No  Physical Activity: Unknown (06/20/2023)   Exercise Vital Sign    Days of Exercise per Week: 0 days    Minutes of Exercise per Session: Not on file  Stress: No Stress Concern Present (06/20/2023)   [employer] of Occupational Health - Occupational Stress Questionnaire    Feeling of Stress : Not at all  Social Connections: Unknown (06/20/2023)   Social Connection and Isolation Panel [NHANES]    Frequency of Communication with Friends and Family: Three times a week    Frequency of Social Gatherings with Friends and Family: More than three times a week    Attends Religious Services: Patient declined    Active Member of Clubs or Organizations: No    Attends Banker Meetings: Not on file    Marital Status: Widowed  Intimate Partner Violence: Unknown (02/08/2023)   Received from [facility], [facility]   HITS    Physically Hurt: Not on file    Insult or Talk Down To: Not on file    Threaten Physical Harm: Not on file    Scream or Curse: Not on file    Family History  Problem Relation Age of Onset   Diabetes Mother     Allergies  Allergen Reactions   Aspirin Anaphylaxis   Penicillins Anaphylaxis    **Tolerates cephalosporins      PE Today's Vitals   08/10/23 1351  BP: 122/74  Pulse: 87  SpO2: 99%  Weight: 127 lb (57.6 kg)  Height: 4\' 11"  (1.499 m)   Body mass index is 25.65 kg/m.  Physical Exam Vitals reviewed.  Constitutional:      General: She is not in acute distress.    Appearance: Normal appearance.  HENT:     Head: Normocephalic and atraumatic.     Nose: Nose normal.  Eyes:      Extraocular Movements: Extraocular movements intact.     Conjunctiva/sclera: Conjunctivae normal.  Neck:     Comments: Port present Pulmonary:     Effort: Pulmonary effort is normal.  Chest:  Breasts:    Right: Normal. No mass, nipple discharge, skin change or tenderness.     Left: Normal. No mass, nipple discharge, skin change or tenderness.  Abdominal:     Comments: RUQ drain and LUQ tube present, clean bandages covering these areas  Musculoskeletal:        General: Normal range of motion.     Cervical back: Normal range of motion.  Lymphadenopathy:     Upper Body:     Right upper body: No axillary adenopathy.     Left upper body: No axillary adenopathy.  Neurological:     General: No focal deficit present.     Comments: Wheelchair bound, intermittently sleeping, easily aroused, answering questions appropriately  Psychiatric:        Mood and Affect: Mood normal.        Behavior: Behavior normal.  Assessment and Plan:        Well woman exam with routine gynecological exam    Rosalyn Gess, MD

## 2023-08-11 ENCOUNTER — Other Ambulatory Visit (HOSPITAL_COMMUNITY): Payer: Self-pay | Admitting: Sports Medicine

## 2023-08-11 DIAGNOSIS — Z992 Dependence on renal dialysis: Secondary | ICD-10-CM | POA: Diagnosis not present

## 2023-08-11 DIAGNOSIS — N186 End stage renal disease: Secondary | ICD-10-CM | POA: Diagnosis not present

## 2023-08-11 DIAGNOSIS — R633 Feeding difficulties, unspecified: Secondary | ICD-10-CM

## 2023-08-11 DIAGNOSIS — N2581 Secondary hyperparathyroidism of renal origin: Secondary | ICD-10-CM | POA: Diagnosis not present

## 2023-08-13 ENCOUNTER — Encounter: Payer: Self-pay | Admitting: Obstetrics and Gynecology

## 2023-08-13 DIAGNOSIS — N2581 Secondary hyperparathyroidism of renal origin: Secondary | ICD-10-CM | POA: Diagnosis not present

## 2023-08-13 DIAGNOSIS — Z992 Dependence on renal dialysis: Secondary | ICD-10-CM | POA: Diagnosis not present

## 2023-08-13 DIAGNOSIS — N186 End stage renal disease: Secondary | ICD-10-CM | POA: Diagnosis not present

## 2023-08-13 NOTE — Patient Instructions (Signed)
For patients under 50-76yo, I recommend 1200mg  calcium daily and 600IU of vitamin D daily. For patients over 76yo, I recommend 1200mg  calcium daily and 800IU of vitamin D daily.  Health Maintenance, Female Adopting a healthy lifestyle and getting preventive care are important in promoting health and wellness. Ask your health care provider about: The right schedule for you to have regular tests and exams. Things you can do on your own to prevent diseases and keep yourself healthy. What should I know about diet, weight, and exercise? Eat a healthy diet  Eat a diet that includes plenty of vegetables, fruits, low-fat dairy products, and lean protein. Do not eat a lot of foods that are high in solid fats, added sugars, or sodium. Maintain a healthy weight Body mass index (BMI) is used to identify weight problems. It estimates body fat based on height and weight. Your health care provider can help determine your BMI and help you achieve or maintain a healthy weight. Get regular exercise Get regular exercise. This is one of the most important things you can do for your health. Most adults should: Exercise for at least 150 minutes each week. The exercise should increase your heart rate and make you sweat (moderate-intensity exercise). Do strengthening exercises at least twice a week. This is in addition to the moderate-intensity exercise. Spend less time sitting. Even light physical activity can be beneficial. Watch cholesterol and blood lipids Have your blood tested for lipids and cholesterol at 76 years of age, then have this test every 5 years. Have your cholesterol levels checked more often if: Your lipid or cholesterol levels are high. You are older than 76 years of age. You are at high risk for heart disease. What should I know about cancer screening? Depending on your health history and family history, you may need to have cancer screening at various ages. This may include screening  for: Breast cancer. Cervical cancer. Colorectal cancer. Skin cancer. Lung cancer. What should I know about heart disease, diabetes, and high blood pressure? Blood pressure and heart disease High blood pressure causes heart disease and increases the risk of stroke. This is more likely to develop in people who have high blood pressure readings or are overweight. Have your blood pressure checked: Every 3-5 years if you are 83-76 years of age. Every year if you are 4 years old or older. Diabetes Have regular diabetes screenings. This checks your fasting blood sugar level. Have the screening done: Once every three years after age 68 if you are at a normal weight and have a low risk for diabetes. More often and at a younger age if you are overweight or have a high risk for diabetes. What should I know about preventing infection? Hepatitis B If you have a higher risk for hepatitis B, you should be screened for this virus. Talk with your health care provider to find out if you are at risk for hepatitis B infection. Hepatitis C Testing is recommended for: Everyone born from [birth year] through 1964. Anyone with known risk factors for hepatitis C. Sexually transmitted infections (STIs) Get screened for STIs, including gonorrhea and chlamydia, if: You are sexually active and are younger than 76 years of age. You are older than 76 years of age and your health care provider tells you that you are at risk for this type of infection. Your sexual activity has changed since you were last screened, and you are at increased risk for chlamydia or gonorrhea. Ask your health care provider if  you are at risk. Ask your health care provider about whether you are at high risk for HIV. Your health care provider may recommend a prescription medicine to help prevent HIV infection. If you choose to take medicine to prevent HIV, you should first get tested for HIV. You should then be tested every 3 months for as long as you  are taking the medicine. Osteoporosis and menopause Osteoporosis is a disease in which the bones lose minerals and strength with aging. This can result in bone fractures. If you are 44 years old or older, or if you are at risk for osteoporosis and fractures, ask your health care provider if you should: Be screened for bone loss. Take a calcium or vitamin D supplement to lower your risk of fractures. Be given hormone replacement therapy (HRT) to treat symptoms of menopause. Follow these instructions at home: Alcohol use Do not drink alcohol if: Your health care provider tells you not to drink. You are pregnant, may be pregnant, or are planning to become pregnant. If you drink alcohol: Limit how much you have to: 0-1 drink a day. Know how much alcohol is in your drink. In the [place] one drink equals one 12 oz bottle of beer (355 mL), one 5 oz glass of wine (148 mL), or one 1 oz glass of hard liquor (44 mL). Lifestyle Do not use any products that contain nicotine or tobacco. These products include cigarettes, chewing tobacco, and vaping devices, such as e-cigarettes. If you need help quitting, ask your health care provider. Do not use street drugs. Do not share needles. Ask your health care provider for help if you need support or information about quitting drugs. General instructions Schedule regular health, dental, and eye exams. Stay current with your vaccines. Tell your health care provider if: You often feel depressed. You have ever been abused or do not feel safe at home. Summary Adopting a healthy lifestyle and getting preventive care are important in promoting health and wellness. Follow your health care provider's instructions about healthy diet, exercising, and getting tested or screened for diseases. Follow your health care provider's instructions on monitoring your cholesterol and blood pressure. This information is not intended to replace advice given to you by your health  care provider. Make sure you discuss any questions you have with your health care provider. Document Revised: 12/30/2020 Document Reviewed: 12/30/2020 Elsevier Patient Education  2024 [employer].

## 2023-08-13 NOTE — Progress Notes (Signed)
77 y.o. 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 Presents with her daughter. Uses lift for transfers at home.  Appetite has decreased significantly over the past month. Also notes more agitation. Has neurology follow-up scheduled.  Megace was recently stopped due to limited mobility. Denies any urinary issues. Very little urine production. Dialysis 3d/wk. Incontinent of bladder and bowel. Vaginal odor back in August. Resolved since. Hysterectomy was completed for fibroids. Had recent cholecystostomy  Postmenopausal bleeding: none Pelvic discharge or pain: none Breast mass, nipple discharge or skin changes : none Last PAP: unsure Last mammogram: 3-5 years in [place]  Last colonoscopy: unknown Last DXA: never Sexually active: no  Exercising: no  GYN HISTORY: Prior Ab hysterectomy  OB History  No obstetric history on file.    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   Dementia (HCC)    Diabetes (HCC)    Diabetes mellitus without complication (HCC)    ESRD (end stage renal disease) on dialysis (HCC)    Heart failure (HCC)    History of CT scan    History of heart attack    History of MRI    History of stroke    HTN (hypertension)    Kidney failure    PEG (percutaneous endoscopic gastrostomy) status (HCC)    Thyroid disease     Past Surgical History:  Procedure Laterality Date   ABDOMINAL HYSTERECTOMY     GALLBLADDER SURGERY     IR EXCHANGE BILIARY DRAIN  02/11/2023   IR EXCHANGE BILIARY DRAIN  04/13/2023   IR EXCHANGE BILIARY DRAIN  06/15/2023   IR PERC CHOLECYSTOSTOMY  12/16/2022   IR REPLC GASTRO/COLONIC TUBE PERCUT W/FLUORO  06/15/2023    Current Outpatient Medications on File Prior to Visit  Medication Sig Dispense Refill   acetaminophen (TYLENOL) 500 MG tablet Place 500 mg  into feeding tube every 6 (six) hours as needed for mild pain.     apixaban (ELIQUIS) 5 MG TABS tablet Place 1 tablet (5 mg total) into feeding tube 2 (two) times daily. 180 tablet 1   carvedilol (COREG) 3.125 MG tablet Place 1 tablet (3.125 mg total) into feeding tube 2 (two) times daily with a meal. Take 3.125 mg Q AM  and 3.125 mg Q PM on non dialysis days (Tues, Thurs, Sat and Sun,   - On dialysis days,  MWF, continue Coreg 3.125 mg Q PM -  Hold for SBP < 100     Continuous Glucose Receiver (DEXCOM G7 RECEIVER) DEVI Check blood sugar before meals and at bedtime 1 each 0   Continuous Glucose Sensor (DEXCOM G7 SENSOR) MISC Apply to back of arm. Change sensor every 10 days. 9 each 3   Glucagon (BAQSIMI ONE PACK) 3 MG/DOSE POWD Place 1 packet into the nose as needed (For blood sugar). Use when blood sugar drops below 70.     insulin aspart (NOVOLOG) 100 UNIT/ML injection 0-9 Units, Subcutaneous, 3 times daily with meals, CBG < 70: Implement Hypoglycemia measures CBG 70 - 120: 0 units CBG 121 - 150: 1 unit CBG 151 - 200: 2 units CBG 201 - 250: 3 units CBG 251 - 300: 5 units CBG 301 - 350: 7 units CBG 351 - 400: 9 units CBG > 400: call MD 10 mL 11   insulin glargine (LANTUS) 100 UNIT/ML  Solostar Pen Inject 20 Units into the skin at bedtime.     Insulin Pen Needle 32G X 4 MM MISC Use to inject insulin 4 times a day for blood sugar management. 200 each 3   multivitamin (RENA-VIT) TABS tablet Take 1 tablet by mouth at bedtime.  0   Nutritional Supplements (FEEDING SUPPLEMENT, NEPRO CARB STEADY,) LIQD Allow Nepro @ 50 ml/hr to run from 8pm-6am (10 hrs total) (Patient taking differently: Allow Nepro @ 50 ml/hr to run from 8pm-8am (12 hrs total). Use of water to flush g-tube before and after tube feeding.) 15000 mL 1   senna (SENOKOT) 8.6 MG tablet Administer 2 tablets into g-tube once daily 180 tablet 1   vitamin B-12 (CYANOCOBALAMIN) 50 MCG tablet Take 50 mcg by mouth daily.     No current  facility-administered medications on file prior to visit.    Social History   Socioeconomic History   Marital status: Single    Spouse name: Not on file   Number of children: Not on file   Years of education: Not on file   Highest education level: Some college, no degree  Occupational History   Not on file  Tobacco Use   Smoking status: Never   Smokeless tobacco: Not on file  Substance and Sexual Activity   Alcohol use: Not Currently   Drug use: Never   Sexual activity: Not Currently  Other Topics Concern   Not on file  Social History Narrative   Tobacco use, amount per day now: N/A   Past tobacco use, amount per day: N/A   How many years did you use tobacco: N/A   Alcohol use (drinks per week): N/A   Diet: N/A   Do you drink/eat things with caffeine: N/A   Marital status:  Widow                                What year were you married? Yes   Do you live in a house, apartment, assisted living, condo, trailer, etc.? House    Is it one or more stories? One story    How many persons live in your home? 2    Do you have pets in your home?( please list) No   Highest Level of education completed? High School    Current or past profession: Environmental health practitioner    Do you exercise?    N/A                              Type and how often?   Do you have a living will? No   Do you have a DNR form?    Yes                               If not, do you want to discuss one?   Do you have signed POA/HPOA forms?    No                    If so, please bring to you appointment      Do you have any difficulty bathing or dressing yourself? Yes   Do you have any difficulty preparing food or eating? Yes   Do you have any difficulty managing your medications? Yes   Do you have any difficulty managing  your finances? Yes   Do you have any difficulty affording your medications? Yes   Social Drivers of Corporate investment banker Strain: Low Risk  (06/20/2023)   Overall Financial Resource  Strain (CARDIA)    Difficulty of Paying Living Expenses: Not hard at all  Food Insecurity: No Food Insecurity (06/20/2023)   Hunger Vital Sign    Worried About Running Out of Food in the Last Year: Never true    Ran Out of Food in the Last Year: Never true  Transportation Needs: No Transportation Needs (06/20/2023)   PRAPARE - Administrator, Civil Service (Medical): No    Lack of Transportation (Non-Medical): No  Physical Activity: Unknown (06/20/2023)   Exercise Vital Sign    Days of Exercise per Week: 0 days    Minutes of Exercise per Session: Not on file  Stress: No Stress Concern Present (06/20/2023)   [employer] of Occupational Health - Occupational Stress Questionnaire    Feeling of Stress : Not at all  Social Connections: Unknown (06/20/2023)   Social Connection and Isolation Panel [NHANES]    Frequency of Communication with Friends and Family: Three times a week    Frequency of Social Gatherings with Friends and Family: More than three times a week    Attends Religious Services: Patient declined    Active Member of Clubs or Organizations: No    Attends Banker Meetings: Not on file    Marital Status: Widowed  Intimate Partner Violence: Unknown (02/08/2023)   Received from [facility]   HITS    Physically Hurt: Not on file    Insult or Talk Down To: Not on file    Threaten Physical Harm: Not on file    Scream or Curse: Not on file    Family History  Problem Relation Age of Onset   Diabetes Mother     Allergies  Allergen Reactions   Aspirin Anaphylaxis   Penicillins Anaphylaxis    **Tolerates cephalosporins      PE There were no vitals filed for this visit.  There is no height or weight on file to calculate BMI. Please see vitals from canceled encounter on same date of service. Physical Exam Vitals reviewed.  Constitutional:      General: She is not in acute distress.    Appearance: Normal appearance.   HENT:     Head: Normocephalic and atraumatic.     Nose: Nose normal.  Eyes:     Extraocular Movements: Extraocular movements intact.     Conjunctiva/sclera: Conjunctivae normal.  Neck:     Comments: Port present Pulmonary:     Effort: Pulmonary effort is normal.  Chest:  Breasts:    Right: Normal. No mass, nipple discharge, skin change or tenderness.     Left: Normal. No mass, nipple discharge, skin change or tenderness.  Abdominal:     Comments: RUQ drain and LUQ tube present, clean bandages covering these areas  Musculoskeletal:        General: Normal range of motion.     Cervical back: Normal range of motion.  Lymphadenopathy:     Upper Body:     Right upper body: No axillary adenopathy.     Left upper body: No axillary adenopathy.  Neurological:     General: No focal deficit present.     Comments: Wheelchair bound, intermittently sleeping, easily aroused, answering questions appropriately  Psychiatric:        Mood and Affect: Mood  normal.        Behavior: Behavior normal.       Assessment and Plan:        Well woman exam with routine gynecological exam Assessment & Plan: Status post benign hysterectomy, no indication for Pap smears at this time. Unable to complete pelvic exam as patient requires lift for transfer, however her daughter denies any concerning symptoms including postmenopausal bleeding, vaginal discharge and vulvar changes. Normal breast exam. Discussed mammogram screening for women over 75.  Patient's daughter to have a discussion with the rest of the family to consider utility of mammogram every 2 years. Labs and immunizations with her primary   Rosalyn Gess, MD

## 2023-08-13 NOTE — Assessment & Plan Note (Addendum)
Status post benign hysterectomy, no indication for Pap smears at this time. Unable to complete pelvic exam as patient requires lift for transfer, however her daughter denies any concerning symptoms including postmenopausal bleeding, vaginal discharge and vulvar changes. Normal breast exam. Discussed mammogram screening for women over 75.  Patient's daughter to have a discussion with the rest of the family to consider utility of mammogram every 2 years. Labs and immunizations with her primary

## 2023-08-15 DIAGNOSIS — R627 Adult failure to thrive: Secondary | ICD-10-CM | POA: Diagnosis not present

## 2023-08-15 DIAGNOSIS — Z9049 Acquired absence of other specified parts of digestive tract: Secondary | ICD-10-CM | POA: Diagnosis not present

## 2023-08-15 DIAGNOSIS — E46 Unspecified protein-calorie malnutrition: Secondary | ICD-10-CM | POA: Diagnosis not present

## 2023-08-15 DIAGNOSIS — Z992 Dependence on renal dialysis: Secondary | ICD-10-CM | POA: Diagnosis not present

## 2023-08-15 DIAGNOSIS — R278 Other lack of coordination: Secondary | ICD-10-CM | POA: Diagnosis not present

## 2023-08-15 DIAGNOSIS — N186 End stage renal disease: Secondary | ICD-10-CM | POA: Diagnosis not present

## 2023-08-15 DIAGNOSIS — Z431 Encounter for attention to gastrostomy: Secondary | ICD-10-CM | POA: Diagnosis not present

## 2023-08-15 DIAGNOSIS — M6281 Muscle weakness (generalized): Secondary | ICD-10-CM | POA: Diagnosis not present

## 2023-08-15 DIAGNOSIS — K810 Acute cholecystitis: Secondary | ICD-10-CM | POA: Diagnosis not present

## 2023-08-15 DIAGNOSIS — R2681 Unsteadiness on feet: Secondary | ICD-10-CM | POA: Diagnosis not present

## 2023-08-15 DIAGNOSIS — N2581 Secondary hyperparathyroidism of renal origin: Secondary | ICD-10-CM | POA: Diagnosis not present

## 2023-08-17 DIAGNOSIS — N2581 Secondary hyperparathyroidism of renal origin: Secondary | ICD-10-CM | POA: Diagnosis not present

## 2023-08-17 DIAGNOSIS — Z992 Dependence on renal dialysis: Secondary | ICD-10-CM | POA: Diagnosis not present

## 2023-08-17 DIAGNOSIS — N186 End stage renal disease: Secondary | ICD-10-CM | POA: Diagnosis not present

## 2023-08-20 DIAGNOSIS — Z992 Dependence on renal dialysis: Secondary | ICD-10-CM | POA: Diagnosis not present

## 2023-08-20 DIAGNOSIS — N2581 Secondary hyperparathyroidism of renal origin: Secondary | ICD-10-CM | POA: Diagnosis not present

## 2023-08-20 DIAGNOSIS — N186 End stage renal disease: Secondary | ICD-10-CM | POA: Diagnosis not present

## 2023-08-22 ENCOUNTER — Telehealth: Payer: Self-pay | Admitting: Adult Health

## 2023-08-22 DIAGNOSIS — Z992 Dependence on renal dialysis: Secondary | ICD-10-CM | POA: Diagnosis not present

## 2023-08-22 DIAGNOSIS — N186 End stage renal disease: Secondary | ICD-10-CM | POA: Diagnosis not present

## 2023-08-22 DIAGNOSIS — N2581 Secondary hyperparathyroidism of renal origin: Secondary | ICD-10-CM | POA: Diagnosis not present

## 2023-08-22 NOTE — Telephone Encounter (Signed)
Daughter called on-call provider regarding noticing slight blood in stool. Patient had last BM 3 days ago. Patient complained of discomfort on her peg-tube site which is due for changing tomorrow, 08/23/23, by interventional radiology.  -  advised for patient to follow up in clinic for evaluation.

## 2023-08-23 ENCOUNTER — Ambulatory Visit (HOSPITAL_COMMUNITY)
Admission: RE | Admit: 2023-08-23 | Discharge: 2023-08-23 | Disposition: A | Discharge status: Home / Self Care | Payer: Medicare HMO | Source: Ambulatory Visit | Attending: Interventional Radiology | Admitting: Interventional Radiology

## 2023-08-23 DIAGNOSIS — Z434 Encounter for attention to other artificial openings of digestive tract: Secondary | ICD-10-CM | POA: Diagnosis not present

## 2023-08-23 DIAGNOSIS — N186 End stage renal disease: Secondary | ICD-10-CM | POA: Diagnosis not present

## 2023-08-23 DIAGNOSIS — R633 Feeding difficulties, unspecified: Secondary | ICD-10-CM

## 2023-08-23 DIAGNOSIS — Z431 Encounter for attention to gastrostomy: Secondary | ICD-10-CM | POA: Diagnosis not present

## 2023-08-23 DIAGNOSIS — K8021 Calculus of gallbladder without cholecystitis with obstruction: Secondary | ICD-10-CM | POA: Diagnosis not present

## 2023-08-23 DIAGNOSIS — K8020 Calculus of gallbladder without cholecystitis without obstruction: Secondary | ICD-10-CM | POA: Diagnosis not present

## 2023-08-23 HISTORY — PX: IR EXCHANGE BILIARY DRAIN: IMG6046

## 2023-08-23 HISTORY — PX: IR REPLACE G-TUBE SIMPLE WO FLUORO: IMG2323

## 2023-08-23 MED ORDER — LIDOCAINE HCL 1 % IJ SOLN
INTRAMUSCULAR | Status: AC
  Filled 2023-08-23: qty 20

## 2023-08-24 DIAGNOSIS — E1122 Type 2 diabetes mellitus with diabetic chronic kidney disease: Secondary | ICD-10-CM | POA: Diagnosis not present

## 2023-08-24 DIAGNOSIS — N2581 Secondary hyperparathyroidism of renal origin: Secondary | ICD-10-CM | POA: Diagnosis not present

## 2023-08-24 DIAGNOSIS — Z992 Dependence on renal dialysis: Secondary | ICD-10-CM | POA: Diagnosis not present

## 2023-08-24 DIAGNOSIS — N186 End stage renal disease: Secondary | ICD-10-CM | POA: Diagnosis not present

## 2023-08-26 ENCOUNTER — Telehealth: Payer: Self-pay

## 2023-08-26 NOTE — Telephone Encounter (Signed)
 Patient daughter left a voicemail states that patient has previously had some blood in her stool. She states she has not seen any now but wanted to know should her mom be seen before her February appointment. I have attempted to call the daughter Delon back and left a voicemail.

## 2023-08-26 NOTE — Telephone Encounter (Signed)
 Attempted to call patient daughter jennifer back LVM

## 2023-08-27 NOTE — Telephone Encounter (Signed)
 Patient daughter called back and left voicemail. I called back and she didn't answer so I left voicemail.

## 2023-09-01 ENCOUNTER — Emergency Department (HOSPITAL_COMMUNITY): Payer: 59

## 2023-09-01 ENCOUNTER — Other Ambulatory Visit: Payer: Self-pay

## 2023-09-01 ENCOUNTER — Emergency Department (HOSPITAL_COMMUNITY)
Admission: EM | Admit: 2023-09-01 | Discharge: 2023-09-02 | Disposition: A | Discharge status: Home / Self Care | Payer: 59

## 2023-09-01 ENCOUNTER — Encounter (HOSPITAL_COMMUNITY): Payer: Self-pay

## 2023-09-01 DIAGNOSIS — Z7901 Long term (current) use of anticoagulants: Secondary | ICD-10-CM | POA: Insufficient documentation

## 2023-09-01 DIAGNOSIS — Z992 Dependence on renal dialysis: Secondary | ICD-10-CM | POA: Diagnosis not present

## 2023-09-01 DIAGNOSIS — E1122 Type 2 diabetes mellitus with diabetic chronic kidney disease: Secondary | ICD-10-CM | POA: Diagnosis not present

## 2023-09-01 DIAGNOSIS — K5900 Constipation, unspecified: Secondary | ICD-10-CM

## 2023-09-01 DIAGNOSIS — Z794 Long term (current) use of insulin: Secondary | ICD-10-CM | POA: Diagnosis not present

## 2023-09-01 DIAGNOSIS — R1012 Left upper quadrant pain: Secondary | ICD-10-CM | POA: Diagnosis present

## 2023-09-01 DIAGNOSIS — N186 End stage renal disease: Secondary | ICD-10-CM | POA: Diagnosis not present

## 2023-09-01 DIAGNOSIS — F0390 Unspecified dementia without behavioral disturbance: Secondary | ICD-10-CM | POA: Diagnosis not present

## 2023-09-01 LAB — LIPASE, BLOOD: Lipase: 55 U/L — ABNORMAL HIGH (ref 11–51)

## 2023-09-01 LAB — CBC WITH DIFFERENTIAL/PLATELET
Abs Immature Granulocytes: 0.03 10*3/uL (ref 0.00–0.07)
Basophils Absolute: 0 10*3/uL (ref 0.0–0.1)
Basophils Relative: 0 %
Eosinophils Absolute: 0.1 10*3/uL (ref 0.0–0.5)
Eosinophils Relative: 1 %
HCT: 38.1 % (ref 36.0–46.0)
Hemoglobin: 11.9 g/dL — ABNORMAL LOW (ref 12.0–15.0)
Immature Granulocytes: 0 %
Lymphocytes Relative: 18 %
Lymphs Abs: 1.8 10*3/uL (ref 0.7–4.0)
MCH: 31.3 pg (ref 26.0–34.0)
MCHC: 31.2 g/dL (ref 30.0–36.0)
MCV: 100.3 fL — ABNORMAL HIGH (ref 80.0–100.0)
Monocytes Absolute: 0.7 10*3/uL (ref 0.1–1.0)
Monocytes Relative: 7 %
Neutro Abs: 7.1 10*3/uL (ref 1.7–7.7)
Neutrophils Relative %: 74 %
Platelets: 193 10*3/uL (ref 150–400)
RBC: 3.8 MIL/uL — ABNORMAL LOW (ref 3.87–5.11)
RDW: 16.8 % — ABNORMAL HIGH (ref 11.5–15.5)
WBC: 9.7 10*3/uL (ref 4.0–10.5)
nRBC: 0 % (ref 0.0–0.2)

## 2023-09-01 LAB — COMPREHENSIVE METABOLIC PANEL
ALT: 20 U/L (ref 0–44)
AST: 38 U/L (ref 15–41)
Albumin: 3.7 g/dL (ref 3.5–5.0)
Alkaline Phosphatase: 149 U/L — ABNORMAL HIGH (ref 38–126)
Anion gap: 15 (ref 5–15)
BUN: 7 mg/dL — ABNORMAL LOW (ref 8–23)
CO2: 28 mmol/L (ref 22–32)
Calcium: 8.6 mg/dL — ABNORMAL LOW (ref 8.9–10.3)
Chloride: 94 mmol/L — ABNORMAL LOW (ref 98–111)
Creatinine, Ser: 1.6 mg/dL — ABNORMAL HIGH (ref 0.44–1.00)
GFR, Estimated: 33 mL/min — ABNORMAL LOW (ref >60)
Glucose, Bld: 202 mg/dL — ABNORMAL HIGH (ref 70–99)
Potassium: 4.5 mmol/L (ref 3.5–5.1)
Sodium: 137 mmol/L (ref 135–145)
Total Bilirubin: 0.7 mg/dL (ref 0.0–1.2)
Total Protein: 7.6 g/dL (ref 6.5–8.1)

## 2023-09-01 MED ORDER — PROMETHAZINE (PHENERGAN) 6.25MG IN NS 50ML IVPB
6.2500 mg | Freq: Once | INTRAVENOUS | Status: DC
  Filled 2023-09-01: qty 50

## 2023-09-01 MED ORDER — MINERAL OIL RE ENEM
1.0000 | ENEMA | Freq: Once | RECTAL | Status: AC
  Administered 2023-09-01: 1 via RECTAL
  Filled 2023-09-01 (×2): qty 1

## 2023-09-01 MED ORDER — SENNA 8.6 MG PO TABS: 1.0000 | ORAL_TABLET | Freq: Every day | ORAL | 0 refills | Status: DC

## 2023-09-01 MED ORDER — PROMETHAZINE HCL 25 MG PO TABS: 12.5000 mg | ORAL_TABLET | Freq: Four times a day (QID) | ORAL | 0 refills | Status: DC | PRN

## 2023-09-01 MED ORDER — SODIUM CHLORIDE 0.9 % IV SOLN
Freq: Once | INTRAVENOUS | Status: AC
  Filled 2023-09-01: qty 50

## 2023-09-01 MED ORDER — ONDANSETRON HCL 4 MG/2ML IJ SOLN
4.0000 mg | Freq: Once | INTRAMUSCULAR | Status: AC
  Administered 2023-09-01: 4 mg via INTRAVENOUS
  Filled 2023-09-01: qty 2

## 2023-09-01 NOTE — ED Notes (Signed)
 Provider at bedside update family on plan of care

## 2023-09-01 NOTE — ED Provider Notes (Signed)
 [place] EMERGENCY DEPARTMENT AT [facility] Provider Note   CSN: [id number] Arrival date & time: 09/01/23  1819     History  Chief Complaint  Patient presents with   Nausea   Abdominal Pain    Samantha [name] is a 78 y.o. female.  78 year old female past medical history of end-stage renal disease, diabetes, and dementia presents the emergency department today with concern for vomiting.  The patient had dialysis today.  She had multiple episodes of vomiting throughout the day.  The patient's family states that she has had some dark brownish looking vomit as the day is gone on.  They also report that she has had some what looks like bloody discharge around her PEG tube.  She has had this PEG tube for 3 years.  She apparently was here recently with a percutaneous catheter for cholecystitis.  This was done 1 week ago.  The patient's family states she has not had a bowel movement in 3 days.  She does have dementia as well which does make taking history somewhat difficult.  She is on Eliquis .   Abdominal Pain Associated symptoms: vomiting        Home Medications Prior to Admission medications   Medication Sig Start Date End Date Taking? Authorizing Provider  promethazine  (PHENERGAN ) 25 MG tablet Take 0.5 tablets (12.5 mg total) by mouth every 6 (six) hours as needed for nausea or vomiting. 09/01/23  Yes Ula Prentice SAUNDERS, MD  senna (SENOKOT) 8.6 MG TABS tablet Take 1 tablet (8.6 mg total) by mouth daily. 09/01/23  Yes Ula Prentice SAUNDERS, MD  acetaminophen  (TYLENOL ) 500 MG tablet Place 500 mg into feeding tube every 6 (six) hours as needed for mild pain.    [provider]  apixaban  (ELIQUIS ) 5 MG TABS tablet Place 1 tablet (5 mg total) into feeding tube 2 (two) times daily. 04/27/23   Billy Knee, FNP  carvedilol  (COREG ) 3.125 MG tablet Place 1 tablet (3.125 mg total) into feeding tube 2 (two) times daily with a meal. Take 3.125 mg Q AM  and 3.125 mg Q PM on non dialysis days  (Tues, Thurs, Sat and Sun,   - On dialysis days,  MWF, continue Coreg  3.125 mg Q PM -  Hold for SBP < 100 07/02/23   Medina-Vargas, Monina C, NP  Continuous Glucose Receiver (DEXCOM G7 RECEIVER) DEVI Check blood sugar before meals and at bedtime 04/20/23   Billy Knee, FNP  Continuous Glucose Sensor (DEXCOM G7 SENSOR) MISC Apply to back of arm. Change sensor every 10 days. 06/04/23   Sherlynn Madden, MD  Glucagon (BAQSIMI ONE PACK) 3 MG/DOSE POWD Place 1 packet into the nose as needed (For blood sugar). Use when blood sugar drops below 70.    [provider]  insulin  aspart (NOVOLOG ) 100 UNIT/ML injection 0-9 Units, Subcutaneous, 3 times daily with meals, CBG < 70: Implement Hypoglycemia measures CBG 70 - 120: 0 units CBG 121 - 150: 1 unit CBG 151 - 200: 2 units CBG 201 - 250: 3 units CBG 251 - 300: 5 units CBG 301 - 350: 7 units CBG 351 - 400: 9 units CBG > 400: call MD 12/25/22   Raenelle Donalda HERO, MD  insulin  glargine (LANTUS ) 100 UNIT/ML Solostar Pen Inject 20 Units into the skin at bedtime.    [provider]  Insulin  Pen Needle 32G X 4 MM MISC Use to inject insulin  4 times a day for blood sugar management. 04/19/23   Billy Knee, FNP  multivitamin (RENA-VIT) TABS tablet Take 1 tablet by mouth at bedtime. 12/25/22   Ghimire, Donalda HERO, MD  Nutritional Supplements (FEEDING SUPPLEMENT, NEPRO CARB STEADY,) LIQD Allow Nepro @ 50 ml/hr to run from 8pm-6am (10 hrs total) Patient taking differently: Allow Nepro @ 50 ml/hr to run from 8pm-8am (12 hrs total). Use of water  to flush g-tube before and after tube feeding. 04/27/23   Billy Knee, FNP  senna (SENOKOT) 8.6 MG tablet Administer 2 tablets into g-tube once daily 04/20/23   Billy Knee, FNP  vitamin B-12 (CYANOCOBALAMIN ) 50 MCG tablet Take 50 mcg by mouth daily.    [provider]      Allergies    Aspirin and Penicillins    Review of Systems   Review of Systems  Gastrointestinal:  Positive for abdominal  pain and vomiting.  All other systems reviewed and are negative.   Physical Exam Updated Vital Signs BP 108/66   Pulse 88   Temp 98.1 F (36.7 C) (Oral)   Resp 16   Ht 4' 11 (1.499 m)   Wt 53.5 kg   SpO2 100%   BMI 23.83 kg/m  Physical Exam Vitals and nursing note reviewed.   Gen: Chronically ill-appearing, no acute distress Eyes: PERRL, EOMI HEENT: no oropharyngeal swelling Neck: trachea midline Resp: clear to auscultation bilaterally Card: Tachycardic, no murmurs, rubs, or gallops Abd: Mildly distended, the patient does have what appears to be small amount of blood on the bandage around her PEG tube, no active bleeding is noted, there is no significant bleeding around the percutaneous drain in the right upper quadrant Extremities: no calf tenderness, no edema Vascular: 2+ radial pulses bilaterally, 2+ DP pulses bilaterally Skin: no rashes Psyc: acting appropriately   ED Results / Procedures / Treatments   Labs (all labs ordered are listed, but only abnormal results are displayed) Labs Reviewed  CBC WITH DIFFERENTIAL/PLATELET - Abnormal; Notable for the following components:      Result Value   RBC 3.80 (*)    Hemoglobin 11.9 (*)    MCV 100.3 (*)    RDW 16.8 (*)    All other components within normal limits  COMPREHENSIVE METABOLIC PANEL - Abnormal; Notable for the following components:   Chloride 94 (*)    Glucose, Bld 202 (*)    BUN 7 (*)    Creatinine, Ser 1.60 (*)    Calcium  8.6 (*)    Alkaline Phosphatase 149 (*)    GFR, Estimated 33 (*)    All other components within normal limits  LIPASE, BLOOD - Abnormal; Notable for the following components:   Lipase 55 (*)    All other components within normal limits  CBG MONITORING, ED - Abnormal; Notable for the following components:   Glucose-Capillary 214 (*)    All other components within normal limits    EKG None  Radiology No results found.  Procedures Procedures    Medications Ordered in  ED Medications  ondansetron  (ZOFRAN ) injection 4 mg (4 mg Intravenous Given 09/01/23 2127)  mineral oil enema 1 enema (1 enema Rectal Given 09/01/23 2305)  sodium chloride  0.9 % 50 mL with promethazine  (PHENERGAN ) 6.25 mg infusion ( Intravenous Stopped 09/02/23 0128)  lidocaine -EPINEPHrine -tetracaine  (LET) topical gel (3 mLs Topical Given 09/02/23 0356)  silver  nitrate applicators applicator 3 Stick (3 Sticks Topical Given 09/02/23 9344)    ED Course/ Medical Decision Making/ A&P  Medical Decision Making 78 year old female with past medical history of dementia, end-stage renal disease on dialysis, and diabetes presenting to the emergency department today with vomiting and abdominal distention.  I will further evaluate the patient here with basic labs including LFTs and lipase to evaluate for hepatobiliary otology or pancreatitis.  Will obtain a noncontrast CT scan to evaluate for obstruction, diverticulitis, colitis, perforated viscus, or other intra-abdominal pathology.  I will reevaluate for ultimate disposition.  The patient's CT shows constipation and an enema is administered.  She did have another episode of vomiting.  She is given an enema and phenergan .  The plan is for discharge if she tolerates PO.  Amount and/or Complexity of Data Reviewed Labs: ordered. Radiology: ordered.  Risk OTC drugs. Prescription drug management.           Final Clinical Impression(s) / ED Diagnoses Final diagnoses:  Constipation, unspecified constipation type    Rx / DC Orders ED Discharge Orders          Ordered    senna (SENOKOT) 8.6 MG TABS tablet  Daily        09/01/23 2355    promethazine  (PHENERGAN ) 25 MG tablet  Every 6 hours PRN        09/01/23 2355              Ula Prentice SAUNDERS, MD 09/04/23 1505

## 2023-09-01 NOTE — Discharge Instructions (Addendum)
 Please give the Phenergan as needed for nausea or vomiting and the Senokot as needed if she does not have any bowel movements.  Follow-up with your doctor.  Return to the ER for worsening symptoms.

## 2023-09-01 NOTE — ED Triage Notes (Signed)
 PT BIB [facility] after family noted increased N/V for the past 24 hours. Family also states PT has not had a Bowel movement for 3 days. PT complains of LUQ abdominal pain. Recent PEG placement DEC 30th. PT has some cognitive delays as baseline. [facility] vitals BP 162/90, P 106, RR 22, CBG 249. PT aox3.

## 2023-09-02 ENCOUNTER — Encounter: Payer: Self-pay | Admitting: Sports Medicine

## 2023-09-02 DIAGNOSIS — K5900 Constipation, unspecified: Secondary | ICD-10-CM | POA: Diagnosis not present

## 2023-09-02 LAB — CBG MONITORING, ED: Glucose-Capillary: 214 mg/dL — ABNORMAL HIGH (ref 70–99)

## 2023-09-02 MED ORDER — SILVER NITRATE-POT NITRATE 75-25 % EX MISC
3.0000 | Freq: Once | CUTANEOUS | Status: AC
  Administered 2023-09-02: 3 via TOPICAL
  Filled 2023-09-02: qty 3

## 2023-09-02 MED ORDER — LIDOCAINE-EPINEPHRINE-TETRACAINE (LET) TOPICAL GEL
3.0000 mL | Freq: Once | TOPICAL | Status: AC
  Administered 2023-09-02: 3 mL via TOPICAL
  Filled 2023-09-02: qty 3

## 2023-09-02 MED ORDER — INSULIN ASPART 100 UNIT/ML IJ SOLN: 0.0000 [IU] | Freq: Three times a day (TID) | INTRAMUSCULAR | Status: DC

## 2023-09-02 NOTE — Telephone Encounter (Signed)
 Paperwork has been printed and placed in your box

## 2023-09-02 NOTE — ED Notes (Addendum)
 This RN spoke with patient daughter [name] and provided up date on place of care and patient status.

## 2023-09-02 NOTE — ED Notes (Signed)
 PT requested blood sugar take again

## 2023-09-02 NOTE — ED Notes (Signed)
 Per lab test couldn't be performed without proper sample card that is not stock in lab. This RN made multiple attempts for locat occult cards for gastric, and notified hemocult card could not be used. Provider made aware.

## 2023-09-02 NOTE — ED Notes (Signed)
 This RN and Sheran Lawless NT were present as witness for during MD rectal exam.

## 2023-09-02 NOTE — ED Provider Notes (Signed)
 Patient signed out by Dr. Ula.  Patient was seen earlier for abdominal pain, bleeding from stoma site.  CT scan showed possible constipation, no other acute abnormality.  Patient receiving enema at time of signout.  I did review the patient's CT and it was read as moderate amount of retained rectal stool.  I performed a digital rectal exam and there is a small amount of very soft stool in the rectum, no impaction.  She received the enema without difficulty, no stool output.  I doubt she has a large stool burden at this time.  Abdominal wall was examined.  Patient has a small area of granulation tissue at the 7:00 region of her PEG tube ostomy.  This was topically anesthetized and cauterized with silver  nitrate due to small amount of bleeding.   Haze Lonni PARAS, MD 09/02/23 [phone]

## 2023-09-02 NOTE — ED Notes (Signed)
 [facility] called, eta "within the next hour"

## 2023-09-07 ENCOUNTER — Telehealth: Payer: Self-pay | Admitting: *Deleted

## 2023-09-07 ENCOUNTER — Ambulatory Visit: Payer: 59 | Admitting: Neurology

## 2023-09-07 NOTE — Telephone Encounter (Addendum)
 Patient daughter is aware and will pick up the form

## 2023-09-07 NOTE — Telephone Encounter (Signed)
 Samantha [name] with [facility] called requesting verbal orders Nursing for 2X8 weeks.   Verbal Orders given.

## 2023-09-07 NOTE — Telephone Encounter (Signed)
 Form signed 09/07/23 and gave to [facility]

## 2023-09-09 ENCOUNTER — Encounter (HOSPITAL_COMMUNITY)
Admission: RE | Disposition: A | Discharge status: Home / Self Care | Payer: Self-pay | Source: Home / Self Care | Attending: Internal Medicine

## 2023-09-09 ENCOUNTER — Ambulatory Visit (HOSPITAL_COMMUNITY)
Admission: RE | Admit: 2023-09-09 | Discharge: 2023-09-09 | Disposition: A | Discharge status: Home / Self Care | Payer: 59 | Attending: Internal Medicine | Admitting: Internal Medicine

## 2023-09-09 ENCOUNTER — Encounter (HOSPITAL_COMMUNITY): Payer: Self-pay | Admitting: Internal Medicine

## 2023-09-09 ENCOUNTER — Other Ambulatory Visit: Payer: Self-pay

## 2023-09-09 SURGERY — DIALYSIS/PERMA CATHETER INSERTION
Anesthesia: LOCAL

## 2023-09-09 MED ORDER — LIDOCAINE HCL (PF) 1 % IJ SOLN
INTRAMUSCULAR | Status: AC
  Filled 2023-09-09: qty 30

## 2023-09-09 NOTE — Progress Notes (Signed)
Patient referred from dialysis clinic for catheter exchange due to exposed cuff.  The patient has severe dementia and is unable to provide any history.  Per dialysis clinic records this catheter was placed in 2021.  Prescribed treatment BFR is 365mL/min and she's often been running 411mL/min during recent treatments.  Her clearances are excellent.   On exam the catheter cuff is at least 1/2 cm from the exit site.  With gentle traction it is firmly anchored in position.  There is no redness or drainage at the exit site.   Recommend: --No need to exchange the catheter today --Continue routine care --Refer back if the cuff becomes exposed.  I think it's unlikely to occur soon given it feels firmly anchored.    Estill Bakes MD [facility] Pager [phone]

## 2023-09-22 ENCOUNTER — Telehealth (HOSPITAL_COMMUNITY): Payer: Self-pay

## 2023-09-22 ENCOUNTER — Other Ambulatory Visit (HOSPITAL_COMMUNITY): Payer: Self-pay | Admitting: Interventional Radiology

## 2023-09-22 DIAGNOSIS — N186 End stage renal disease: Secondary | ICD-10-CM

## 2023-09-22 NOTE — Telephone Encounter (Signed)
Called to schedule f/u, no answer, left vm. AB

## 2023-09-23 ENCOUNTER — Other Ambulatory Visit (HOSPITAL_COMMUNITY): Payer: Self-pay | Admitting: Interventional Radiology

## 2023-09-23 DIAGNOSIS — R633 Feeding difficulties, unspecified: Secondary | ICD-10-CM

## 2023-09-24 ENCOUNTER — Other Ambulatory Visit: Payer: Self-pay

## 2023-09-24 ENCOUNTER — Telehealth: Payer: Self-pay

## 2023-09-24 ENCOUNTER — Encounter (HOSPITAL_COMMUNITY): Payer: Self-pay | Admitting: Emergency Medicine

## 2023-09-24 ENCOUNTER — Inpatient Hospital Stay (HOSPITAL_COMMUNITY)
Admission: EM | Admit: 2023-09-24 | Discharge: 2023-09-27 | DRG: 393 | Disposition: A | Discharge status: Home / Self Care | Payer: 59 | Attending: Family Medicine | Admitting: Family Medicine

## 2023-09-24 DIAGNOSIS — Y838 Other surgical procedures as the cause of abnormal reaction of the patient, or of later complication, without mention of misadventure at the time of the procedure: Secondary | ICD-10-CM | POA: Diagnosis present

## 2023-09-24 DIAGNOSIS — E1165 Type 2 diabetes mellitus with hyperglycemia: Secondary | ICD-10-CM | POA: Diagnosis present

## 2023-09-24 DIAGNOSIS — Z880 Allergy status to penicillin: Secondary | ICD-10-CM

## 2023-09-24 DIAGNOSIS — I252 Old myocardial infarction: Secondary | ICD-10-CM

## 2023-09-24 DIAGNOSIS — I10 Essential (primary) hypertension: Secondary | ICD-10-CM | POA: Diagnosis present

## 2023-09-24 DIAGNOSIS — E039 Hypothyroidism, unspecified: Secondary | ICD-10-CM | POA: Diagnosis present

## 2023-09-24 DIAGNOSIS — E8889 Other specified metabolic disorders: Secondary | ICD-10-CM | POA: Diagnosis present

## 2023-09-24 DIAGNOSIS — Z86718 Personal history of other venous thrombosis and embolism: Secondary | ICD-10-CM

## 2023-09-24 DIAGNOSIS — Z7901 Long term (current) use of anticoagulants: Secondary | ICD-10-CM

## 2023-09-24 DIAGNOSIS — K5641 Fecal impaction: Secondary | ICD-10-CM | POA: Diagnosis present

## 2023-09-24 DIAGNOSIS — I132 Hypertensive heart and chronic kidney disease with heart failure and with stage 5 chronic kidney disease, or end stage renal disease: Secondary | ICD-10-CM | POA: Diagnosis present

## 2023-09-24 DIAGNOSIS — K9423 Gastrostomy malfunction: Secondary | ICD-10-CM | POA: Diagnosis not present

## 2023-09-24 DIAGNOSIS — E119 Type 2 diabetes mellitus without complications: Secondary | ICD-10-CM

## 2023-09-24 DIAGNOSIS — Z79899 Other long term (current) drug therapy: Secondary | ICD-10-CM

## 2023-09-24 DIAGNOSIS — Z95828 Presence of other vascular implants and grafts: Secondary | ICD-10-CM

## 2023-09-24 DIAGNOSIS — F03C0 Unspecified dementia, severe, without behavioral disturbance, psychotic disturbance, mood disturbance, and anxiety: Secondary | ICD-10-CM | POA: Diagnosis present

## 2023-09-24 DIAGNOSIS — R1032 Left lower quadrant pain: Secondary | ICD-10-CM

## 2023-09-24 DIAGNOSIS — R109 Unspecified abdominal pain: Secondary | ICD-10-CM | POA: Diagnosis not present

## 2023-09-24 DIAGNOSIS — I69391 Dysphagia following cerebral infarction: Secondary | ICD-10-CM

## 2023-09-24 DIAGNOSIS — K5289 Other specified noninfective gastroenteritis and colitis: Secondary | ICD-10-CM | POA: Diagnosis present

## 2023-09-24 DIAGNOSIS — Z833 Family history of diabetes mellitus: Secondary | ICD-10-CM

## 2023-09-24 DIAGNOSIS — Z992 Dependence on renal dialysis: Secondary | ICD-10-CM

## 2023-09-24 DIAGNOSIS — N2581 Secondary hyperparathyroidism of renal origin: Secondary | ICD-10-CM | POA: Diagnosis present

## 2023-09-24 DIAGNOSIS — I6930 Unspecified sequelae of cerebral infarction: Secondary | ICD-10-CM

## 2023-09-24 DIAGNOSIS — K529 Noninfective gastroenteritis and colitis, unspecified: Principal | ICD-10-CM

## 2023-09-24 DIAGNOSIS — I5020 Unspecified systolic (congestive) heart failure: Secondary | ICD-10-CM | POA: Diagnosis present

## 2023-09-24 DIAGNOSIS — N186 End stage renal disease: Secondary | ICD-10-CM

## 2023-09-24 DIAGNOSIS — R000 Tachycardia, unspecified: Secondary | ICD-10-CM

## 2023-09-24 DIAGNOSIS — Z886 Allergy status to analgesic agent status: Secondary | ICD-10-CM

## 2023-09-24 DIAGNOSIS — Z794 Long term (current) use of insulin: Secondary | ICD-10-CM

## 2023-09-24 DIAGNOSIS — Z90710 Acquired absence of both cervix and uterus: Secondary | ICD-10-CM

## 2023-09-24 DIAGNOSIS — D61818 Other pancytopenia: Secondary | ICD-10-CM | POA: Diagnosis present

## 2023-09-24 DIAGNOSIS — E1140 Type 2 diabetes mellitus with diabetic neuropathy, unspecified: Secondary | ICD-10-CM | POA: Diagnosis present

## 2023-09-24 DIAGNOSIS — I5022 Chronic systolic (congestive) heart failure: Secondary | ICD-10-CM | POA: Diagnosis present

## 2023-09-24 DIAGNOSIS — I69354 Hemiplegia and hemiparesis following cerebral infarction affecting left non-dominant side: Secondary | ICD-10-CM

## 2023-09-24 DIAGNOSIS — E1122 Type 2 diabetes mellitus with diabetic chronic kidney disease: Secondary | ICD-10-CM | POA: Diagnosis present

## 2023-09-24 NOTE — Telephone Encounter (Addendum)
Patient called in regards to her FMLA paperwork that was sent. She would need number 9 say an estimate of 6 month and # 10 can say up to 3 times a week. Her employer states its okay to mark through the last message and initial , date and fax back. Papers have been placed in your box/

## 2023-09-24 NOTE — ED Provider Notes (Signed)
[place] EMERGENCY DEPARTMENT AT [facility] Provider Note   CSN: [id number] Arrival date & time: 09/24/23  2154     History {Add pertinent medical, surgical, social history, OB history to HPI:1} Chief Complaint  Patient presents with   GI Problem    Pt BIB EMS from home for PEG tube problems. Per EMS, pt gets nightly feeds through her PEG tube and tonight the feeds would not run . Insertion site does not appear to be infected, pt currently denies pain. Hx dementia.     Samantha [name] is a 78 y.o. female.  HPI      PEG tube not flushing, couldn't give her feeding because the piece is broken Having abdominal pain in that area DM, dialysis, feet hurting her for a week  3 days of abdominal pain Nausea No vomiting Did have normal loose bowel movement No fever.  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 Authorizing Provider  acetaminophen (TYLENOL) 500 MG tablet Place 500 mg into feeding tube every 6 (six) hours as needed for mild pain.    [provider]  apixaban (ELIQUIS) 5 MG TABS tablet Place 1 tablet (5 mg total) into feeding tube 2 (two) times daily. 04/27/23   Salvatore Decent, FNP  carvedilol (COREG) 3.125 MG tablet Place 1 tablet (3.125 mg total) into feeding tube 2 (two) times daily with a meal. Take 3.125 mg Q AM  and 3.125 mg  Q PM on non dialysis days (Tues, Thurs, Sat and Sun,   - On dialysis days,  MWF, continue Coreg 3.125 mg Q PM -  Hold for SBP < 100 07/02/23   Medina-Vargas, Monina C, NP  Continuous Glucose Receiver (DEXCOM G7 RECEIVER) DEVI Check blood sugar before meals and at bedtime 04/20/23   Salvatore Decent, FNP  Continuous Glucose Sensor (DEXCOM G7 SENSOR) MISC Apply to back of arm. Change sensor every 10 days. 06/04/23   [name], MD  Glucagon (BAQSIMI ONE PACK) 3 MG/DOSE POWD Place 1 packet into the nose as needed (For blood sugar). Use when blood sugar drops below 70.    [provider]  insulin aspart (NOVOLOG) 100 UNIT/ML injection 0-9 Units, Subcutaneous, 3 times daily with meals, CBG < 70: Implement Hypoglycemia measures CBG 70 - 120: 0 units CBG 121 - 150: 1 unit CBG 151 - 200: 2 units CBG 201 - 250: 3 units CBG 251 - 300: 5 units CBG 301 - 350: 7 units CBG 351 - 400: 9 units CBG > 400: call MD 12/25/22   Maretta Bees, MD  insulin glargine (LANTUS) 100 UNIT/ML Solostar Pen Inject 20 Units into the skin at bedtime.    [provider]  Insulin Pen Needle 32G  X 4 MM MISC Use to inject insulin 4 times a day for blood sugar management. 04/19/23   Salvatore Decent, FNP  multivitamin (RENA-VIT) TABS tablet Take 1 tablet by mouth at bedtime. 12/25/22   Ghimire, Werner Lean, MD  Nutritional Supplements (FEEDING SUPPLEMENT, NEPRO CARB STEADY,) LIQD Allow Nepro @ 50 ml/hr to run from 8pm-6am (10 hrs total) Patient not taking: Reported on 09/08/2023 04/27/23   Salvatore Decent, FNP  Nutritional Supplements (FEEDING SUPPLEMENT, OSMOLITE 1.5 CAL,) LIQD Place 1,000 mLs into feeding tube See admin instructions. 8p-6a    [provider]  promethazine (PHENERGAN) 25 MG tablet Take 0.5 tablets (12.5 mg total) by mouth every 6 (six) hours as needed for nausea or vomiting. 09/01/23   Durwin Glaze, MD  senna (SENOKOT) 8.6 MG TABS tablet Take 1 tablet (8.6 mg total) by mouth daily. Patient taking  differently: Take 1 tablet by mouth at bedtime. 09/01/23   Durwin Glaze, MD      Allergies    Aspirin and Penicillins    Review of Systems   Review of Systems  Physical Exam Updated Vital Signs BP 106/78 (BP Location: Right Arm)   Pulse 92   Temp 99 F (37.2 C) (Oral)   Resp 20   SpO2 100%  Physical Exam  ED Results / Procedures / Treatments   Labs (all labs ordered are listed, but only abnormal results are displayed) Labs Reviewed - No data to display  EKG None  Radiology No results found.  Procedures Procedures  {Document cardiac monitor, telemetry assessment procedure when appropriate:1}  Medications Ordered in ED Medications - No data to display  ED Course/ Medical Decision Making/ A&P   {   Click here for ABCD2, HEART and other calculatorsREFRESH Note before signing :1}                              Medical Decision Making  ***  {Document critical care time when appropriate:1} {Document review of labs and clinical decision tools ie heart score, Chads2Vasc2 etc:1}  {Document your independent review of radiology images, and any outside records:1} {Document your discussion with family members, caretakers, and with consultants:1} {Document social determinants of health affecting pt's care:1} {Document your decision making why or why not admission, treatments were needed:1} Final Clinical Impression(s) / ED Diagnoses Final diagnoses:  None    Rx / DC Orders ED Discharge Orders     None

## 2023-09-25 ENCOUNTER — Emergency Department (HOSPITAL_COMMUNITY): Payer: 59

## 2023-09-25 ENCOUNTER — Other Ambulatory Visit: Payer: Self-pay

## 2023-09-25 DIAGNOSIS — I5020 Unspecified systolic (congestive) heart failure: Secondary | ICD-10-CM | POA: Diagnosis present

## 2023-09-25 DIAGNOSIS — K5289 Other specified noninfective gastroenteritis and colitis: Secondary | ICD-10-CM | POA: Diagnosis not present

## 2023-09-25 DIAGNOSIS — K5641 Fecal impaction: Secondary | ICD-10-CM | POA: Diagnosis present

## 2023-09-25 DIAGNOSIS — N186 End stage renal disease: Secondary | ICD-10-CM

## 2023-09-25 DIAGNOSIS — D61818 Other pancytopenia: Secondary | ICD-10-CM | POA: Diagnosis present

## 2023-09-25 DIAGNOSIS — I6930 Unspecified sequelae of cerebral infarction: Secondary | ICD-10-CM

## 2023-09-25 DIAGNOSIS — I10 Essential (primary) hypertension: Secondary | ICD-10-CM

## 2023-09-25 DIAGNOSIS — Z992 Dependence on renal dialysis: Secondary | ICD-10-CM

## 2023-09-25 DIAGNOSIS — Z86718 Personal history of other venous thrombosis and embolism: Secondary | ICD-10-CM

## 2023-09-25 DIAGNOSIS — K9423 Gastrostomy malfunction: Secondary | ICD-10-CM | POA: Diagnosis not present

## 2023-09-25 DIAGNOSIS — E118 Type 2 diabetes mellitus with unspecified complications: Secondary | ICD-10-CM

## 2023-09-25 LAB — URINALYSIS, W/ REFLEX TO CULTURE (INFECTION SUSPECTED)
Bilirubin Urine: NEGATIVE
Glucose, UA: NEGATIVE mg/dL
Ketones, ur: NEGATIVE mg/dL
Nitrite: NEGATIVE
Protein, ur: 100 mg/dL — AB
Specific Gravity, Urine: 1.01 (ref 1.005–1.030)
pH: 7 (ref 5.0–8.0)

## 2023-09-25 LAB — CBC WITH DIFFERENTIAL/PLATELET
Abs Immature Granulocytes: 0.03 10*3/uL (ref 0.00–0.07)
Basophils Absolute: 0.1 10*3/uL (ref 0.0–0.1)
Basophils Relative: 1 %
Eosinophils Absolute: 0.3 10*3/uL (ref 0.0–0.5)
Eosinophils Relative: 4 %
HCT: 31.4 % — ABNORMAL LOW (ref 36.0–46.0)
Hemoglobin: 9.6 g/dL — ABNORMAL LOW (ref 12.0–15.0)
Immature Granulocytes: 0 %
Lymphocytes Relative: 35 %
Lymphs Abs: 2.9 10*3/uL (ref 0.7–4.0)
MCH: 30.3 pg (ref 26.0–34.0)
MCHC: 30.6 g/dL (ref 30.0–36.0)
MCV: 99.1 fL (ref 80.0–100.0)
Monocytes Absolute: 1 10*3/uL (ref 0.1–1.0)
Monocytes Relative: 12 %
Neutro Abs: 3.9 10*3/uL (ref 1.7–7.7)
Neutrophils Relative %: 48 %
Platelets: 143 10*3/uL — ABNORMAL LOW (ref 150–400)
RBC: 3.17 MIL/uL — ABNORMAL LOW (ref 3.87–5.11)
RDW: 17.2 % — ABNORMAL HIGH (ref 11.5–15.5)
WBC: 8.1 10*3/uL (ref 4.0–10.5)
nRBC: 0 % (ref 0.0–0.2)

## 2023-09-25 LAB — COMPREHENSIVE METABOLIC PANEL
ALT: 20 U/L (ref 0–44)
AST: 30 U/L (ref 15–41)
Albumin: 3.2 g/dL — ABNORMAL LOW (ref 3.5–5.0)
Alkaline Phosphatase: 153 U/L — ABNORMAL HIGH (ref 38–126)
Anion gap: 9 (ref 5–15)
BUN: 5 mg/dL — ABNORMAL LOW (ref 8–23)
CO2: 34 mmol/L — ABNORMAL HIGH (ref 22–32)
Calcium: 9 mg/dL (ref 8.9–10.3)
Chloride: 95 mmol/L — ABNORMAL LOW (ref 98–111)
Creatinine, Ser: 1.95 mg/dL — ABNORMAL HIGH (ref 0.44–1.00)
GFR, Estimated: 26 mL/min — ABNORMAL LOW (ref >60)
Glucose, Bld: 102 mg/dL — ABNORMAL HIGH (ref 70–99)
Potassium: 3.5 mmol/L (ref 3.5–5.1)
Sodium: 138 mmol/L (ref 135–145)
Total Bilirubin: 0.4 mg/dL (ref 0.0–1.2)
Total Protein: 6.6 g/dL (ref 6.5–8.1)

## 2023-09-25 LAB — TYPE AND SCREEN
ABO/RH(D): B POS
Antibody Screen: NEGATIVE

## 2023-09-25 LAB — CBG MONITORING, ED
Glucose-Capillary: 85 mg/dL (ref 70–99)
Glucose-Capillary: 140 mg/dL — ABNORMAL HIGH (ref 70–99)
Glucose-Capillary: 122 mg/dL — ABNORMAL HIGH (ref 70–99)

## 2023-09-25 LAB — HEMOGLOBIN A1C
Hgb A1c MFr Bld: 6.6 % — ABNORMAL HIGH (ref 4.8–5.6)
Mean Plasma Glucose: 142.72 mg/dL

## 2023-09-25 LAB — HEMOGLOBIN AND HEMATOCRIT, BLOOD
HCT: 32.6 % — ABNORMAL LOW (ref 36.0–46.0)
Hemoglobin: 10.1 g/dL — ABNORMAL LOW (ref 12.0–15.0)

## 2023-09-25 LAB — GLUCOSE, CAPILLARY
Glucose-Capillary: 138 mg/dL — ABNORMAL HIGH (ref 70–99)
Glucose-Capillary: 177 mg/dL — ABNORMAL HIGH (ref 70–99)

## 2023-09-25 LAB — LIPASE, BLOOD: Lipase: 60 U/L — ABNORMAL HIGH (ref 11–51)

## 2023-09-25 LAB — ABO/RH: ABO/RH(D): B POS

## 2023-09-25 MED ORDER — SODIUM CHLORIDE 0.9% FLUSH
10.0000 mL | Freq: Two times a day (BID) | INTRAVENOUS | Status: DC
  Administered 2023-09-26 (×2): 10 mL

## 2023-09-25 MED ORDER — RENA-VITE PO TABS
1.0000 | ORAL_TABLET | Freq: Every day | ORAL | Status: DC
Start: 2023-09-25 — End: 2023-09-28
  Administered 2023-09-26: 1 via ORAL
  Filled 2023-09-25 (×2): qty 1

## 2023-09-25 MED ORDER — SODIUM CHLORIDE 0.9% FLUSH: 10.0000 mL | INTRAVENOUS | Status: DC | PRN

## 2023-09-25 MED ORDER — INSULIN ASPART 100 UNIT/ML IJ SOLN: 0.0000 [IU] | INTRAMUSCULAR | Status: DC

## 2023-09-25 MED ORDER — METRONIDAZOLE 500 MG/100ML IV SOLN
500.0000 mg | Freq: Two times a day (BID) | INTRAVENOUS | Status: DC
  Administered 2023-09-26 – 2023-09-27 (×4): 500 mg via INTRAVENOUS
  Filled 2023-09-25 (×4): qty 100

## 2023-09-25 MED ORDER — SENNOSIDES-DOCUSATE SODIUM 8.6-50 MG PO TABS
1.0000 | ORAL_TABLET | Freq: Two times a day (BID) | ORAL | Status: DC
  Administered 2023-09-26 – 2023-09-27 (×2): 1 via ORAL
  Filled 2023-09-25 (×4): qty 1

## 2023-09-25 MED ORDER — CIPROFLOXACIN IN D5W 400 MG/200ML IV SOLN: 400.0000 mg | Freq: Two times a day (BID) | INTRAVENOUS | Status: DC

## 2023-09-25 MED ORDER — CARVEDILOL 3.125 MG PO TABS
3.1250 mg | ORAL_TABLET | Freq: Two times a day (BID) | ORAL | Status: DC
  Administered 2023-09-26 (×2): 3.125 mg
  Filled 2023-09-25 (×2): qty 1

## 2023-09-25 MED ORDER — CEFTRIAXONE SODIUM 2 G IJ SOLR
2.0000 g | INTRAMUSCULAR | Status: DC
  Administered 2023-09-25 – 2023-09-26 (×2): 2 g via INTRAVENOUS
  Filled 2023-09-25 (×2): qty 20

## 2023-09-25 MED ORDER — SODIUM CHLORIDE 0.9% FLUSH
3.0000 mL | Freq: Two times a day (BID) | INTRAVENOUS | Status: DC
  Administered 2023-09-25 – 2023-09-26 (×3): 3 mL via INTRAVENOUS

## 2023-09-25 MED ORDER — INSULIN GLARGINE-YFGN 100 UNIT/ML ~~LOC~~ SOLN
15.0000 [IU] | Freq: Every day | SUBCUTANEOUS | Status: DC
  Administered 2023-09-26 (×2): 15 [IU] via SUBCUTANEOUS
  Filled 2023-09-25 (×4): qty 0.15

## 2023-09-25 MED ORDER — INSULIN ASPART 100 UNIT/ML IJ SOLN
0.0000 [IU] | Freq: Four times a day (QID) | INTRAMUSCULAR | Status: DC
  Administered 2023-09-25 – 2023-09-26 (×2): 2 [IU] via SUBCUTANEOUS
  Administered 2023-09-26: 1 [IU] via SUBCUTANEOUS
  Administered 2023-09-26: 3 [IU] via SUBCUTANEOUS
  Administered 2023-09-27: 1 [IU] via SUBCUTANEOUS
  Administered 2023-09-27: 2 [IU] via SUBCUTANEOUS

## 2023-09-25 MED ORDER — OSMOLITE 1.2 CAL PO LIQD
1000.0000 mL | ORAL | Status: DC
  Administered 2023-09-25 – 2023-09-26 (×3): 1000 mL
  Filled 2023-09-25 (×4): qty 1000

## 2023-09-25 MED ORDER — SMOG ENEMA
300.0000 mL | Freq: Once | RECTAL | Status: AC
  Administered 2023-09-25: 300 mL via RECTAL
  Filled 2023-09-25: qty 960

## 2023-09-25 MED ORDER — CHLORHEXIDINE GLUCONATE CLOTH 2 % EX PADS
6.0000 | MEDICATED_PAD | Freq: Every day | CUTANEOUS | Status: DC
  Administered 2023-09-25 – 2023-09-27 (×3): 6 via TOPICAL

## 2023-09-25 MED ORDER — METRONIDAZOLE 500 MG/100ML IV SOLN
500.0000 mg | Freq: Once | INTRAVENOUS | Status: AC
  Administered 2023-09-25: 500 mg via INTRAVENOUS
  Filled 2023-09-25: qty 100

## 2023-09-25 MED ORDER — CIPROFLOXACIN IN D5W 400 MG/200ML IV SOLN
400.0000 mg | Freq: Once | INTRAVENOUS | Status: AC
  Administered 2023-09-25: 400 mg via INTRAVENOUS
  Filled 2023-09-25: qty 200

## 2023-09-25 MED ORDER — APIXABAN 5 MG PO TABS
5.0000 mg | ORAL_TABLET | Freq: Two times a day (BID) | ORAL | Status: DC
  Administered 2023-09-26 – 2023-09-27 (×3): 5 mg
  Filled 2023-09-25 (×4): qty 1

## 2023-09-25 NOTE — ED Notes (Signed)
 Phlebotomy at bedside.

## 2023-09-25 NOTE — ED Notes (Signed)
Pt left with transport. No new onset distress  noted at this time.

## 2023-09-25 NOTE — ED Notes (Signed)
This RN attempted to obtain IV access and was unsuccessful. This RN paged IV team for assistance with obtaining labs and IV access at this time.

## 2023-09-25 NOTE — ED Notes (Signed)
Pts BG 80. Per daughter, pt typically receives nightly feeds but cannot tonight due to her tube needing to be replaced. Per daughter at bedside, pt can consume fluids orally. This RN gave pt 8oz of orange juice orally to maintain BG overnight.

## 2023-09-25 NOTE — ED Notes (Signed)
 IV team at bedside

## 2023-09-25 NOTE — Progress Notes (Signed)
IR Procedure Request - G tube exchange due to clogging.   78 y.o. female inpatient ( in the ED). History of chronic indwelling gastrostomy tube last exchanged on 12.30.24.  20 Fr. Patient presented with a clogged G tube. Med port and feed port both able to be flushed at bedside without incident. Patient tolerated procedure. ED RN made aware. Epic Chat sent to MD

## 2023-09-25 NOTE — ED Provider Notes (Signed)
Signed out to me by Dr. Dalene Seltzer.  Lab work, CT pending.  Patient was sent to the ED because her PEG tube was malfunctioning.  It appears that her PEG tube exchanges have been done by IR in the past.  Lab work approximately at baseline, slight increase in creatinine.  No significant anemia from baseline, no white blood cell count elevation.  CT scan shows fecal impaction and findings concerning for stercoral colitis.  Patient reexamined.  Abdominal exam reveals mild distention and generalized tenderness but no peritonitis.  Rectal exam reveals soft, liquidy stool that cannot be disimpacted.  Patient will require replacement of her G-tube by IR.  With her generalized abdominal discomfort, stercoral colitis, will ask hospitalist to admit patient for further management.     Gilda Crease, MD 09/25/23 [phone]

## 2023-09-25 NOTE — ED Notes (Signed)
CT order changed to without contrast, this RN contacted phlebotomist to attempt labwork at this time.

## 2023-09-25 NOTE — ED Notes (Signed)
Patient changed by this RN and tech. This RN attempted to straight cath for urinaylsis and was unsuccessful.

## 2023-09-25 NOTE — H&P (Addendum)
History and Physical    Patient: Samantha [name] WGN:[id number] DOB: [birth year] DOA: 09/24/2023 DOS: the patient was seen and examined on 09/25/2023 PCP: [name], MD  Patient coming from: Home via EMS  Chief Complaint:  Chief Complaint  Patient presents with   GI Problem    Pt BIB EMS from home for PEG tube problems. Per EMS, pt gets nightly feeds through her PEG tube and tonight the feeds would not run . Insertion site does not appear to be infected, pt currently denies pain. Hx dementia.    HPI: Samantha [name] is a 78 y.o. female with medical history significant of hypertension, CVA with residual left-sided hemiparesis, dysphagia  s/p PEG, ESRD on HD, diabetes mellitus type 2, hypothyroidism, and severe dementia who presents due to PEG tube being clogged. She reported having abdominal pain localized to the area around her PEG tube.   She is on a regular diet and receives tube feeds at night. She missed her tube feeds yesterday due to being clogged.  Her daughter makes note that she has been having issues with bloating and constipation they have been giving her Senokot daily.  Patient reports she had a bowel movement yesterday, which she describes as normal, and denies any pain during bowel movements.    She has a history of a stroke, resulting in weakness on her left side.   In the emergency department patient was noted to be afebrile with mild tachypnea and otherwise relatively stable vital signs.  Labs significant for hemoglobin 9.6 down from 11.9 on 1/8.  Pelvis noted findings concerning for fecal impaction and stercoral colitis with unchanged position of cholecystectomy tube to decompress gallbladder.  Patient had been given empiric antibiotics of ciprofloxacin and metronidazole IV.  Review of Systems: As mentioned in the history of present illness. All other systems reviewed and are negative. Past Medical History:  Diagnosis Date   CVA (cerebral vascular accident) (HCC)     residual left sided weakness   Dementia (HCC)    Diabetes (HCC)    Diabetes mellitus without complication (HCC)    ESRD (end stage renal disease) on dialysis (HCC)    Heart failure (HCC)    History of CT scan    History of heart attack    History of MRI    History of stroke    HTN (hypertension)    Kidney failure    PEG (percutaneous endoscopic gastrostomy) status (HCC)    Thyroid disease    Past Surgical History:  Procedure Laterality Date   ABDOMINAL HYSTERECTOMY     GALLBLADDER SURGERY     IR EXCHANGE BILIARY DRAIN  02/11/2023   IR EXCHANGE BILIARY DRAIN  04/13/2023   IR EXCHANGE BILIARY DRAIN  06/15/2023   IR EXCHANGE BILIARY DRAIN  08/23/2023   IR PERC CHOLECYSTOSTOMY  12/16/2022   IR REPLACE G-TUBE SIMPLE WO FLUORO  08/23/2023   IR REPLC GASTRO/COLONIC TUBE PERCUT W/FLUORO  06/15/2023   Social History:  reports that she has never smoked. She does not have any smokeless tobacco history on file. She reports that she does not currently use alcohol. She reports that she does not use drugs.  Allergies  Allergen Reactions   Aspirin Anaphylaxis   Penicillins Anaphylaxis    **Tolerates cephalosporins    Family History  Problem Relation Age of Onset   Diabetes Mother     Prior to Admission medications   Medication Sig Start Date End Date Taking? Authorizing Provider  acetaminophen (TYLENOL) 500 MG tablet Place  500 mg into feeding tube every 6 (six) hours as needed for mild pain.   Yes [provider]  apixaban (ELIQUIS) 5 MG TABS tablet Place 1 tablet (5 mg total) into feeding tube 2 (two) times daily. 04/27/23   Salvatore Decent, FNP  carvedilol (COREG) 3.125 MG tablet Place 1 tablet (3.125 mg total) into feeding tube 2 (two) times daily with a meal. Take 3.125 mg Q AM  and 3.125 mg Q PM on non dialysis days (Tues, Thurs, Sat and Sun,   - On dialysis days,  MWF, continue Coreg 3.125 mg Q PM -  Hold for SBP < 100 07/02/23   Medina-Vargas, Monina C, NP  Glucagon  (BAQSIMI ONE PACK) 3 MG/DOSE POWD Place 1 packet into the nose as needed (For blood sugar). Use when blood sugar drops below 70.    [provider]  insulin aspart (NOVOLOG) 100 UNIT/ML injection 0-9 Units, Subcutaneous, 3 times daily with meals, CBG < 70: Implement Hypoglycemia measures CBG 70 - 120: 0 units CBG 121 - 150: 1 unit CBG 151 - 200: 2 units CBG 201 - 250: 3 units CBG 251 - 300: 5 units CBG 301 - 350: 7 units CBG 351 - 400: 9 units CBG > 400: call MD 12/25/22   Maretta Bees, MD  insulin glargine (LANTUS) 100 UNIT/ML Solostar Pen Inject 20 Units into the skin at bedtime.    [provider]  multivitamin (RENA-VIT) TABS tablet Take 1 tablet by mouth at bedtime. 12/25/22   Ghimire, Werner Lean, MD  Nutritional Supplements (FEEDING SUPPLEMENT, NEPRO CARB STEADY,) LIQD Allow Nepro @ 50 ml/hr to run from 8pm-6am (10 hrs total) Patient not taking: Reported on 09/08/2023 04/27/23   Salvatore Decent, FNP  Nutritional Supplements (FEEDING SUPPLEMENT, OSMOLITE 1.5 CAL,) LIQD Place 1,000 mLs into feeding tube See admin instructions. 8p-6a    [provider]  promethazine (PHENERGAN) 25 MG tablet Take 0.5 tablets (12.5 mg total) by mouth every 6 (six) hours as needed for nausea or vomiting. 09/01/23   Durwin Glaze, MD  senna (SENOKOT) 8.6 MG TABS tablet Take 1 tablet (8.6 mg total) by mouth daily. Patient taking differently: Take 1 tablet by mouth at bedtime. 09/01/23   Durwin Glaze, MD    Physical Exam: Vitals:   09/25/23 0515 09/25/23 0545 09/25/23 0600 09/25/23 0636  BP:  (!) 103/57 (!) 146/89 121/63  Pulse:    84  Resp:  (!) 24  18  Temp:    98.5 F (36.9 C)  TempSrc:    Oral  SpO2: 100%   98%   Constitutional: Elderly female who appears to be in no acute distress at this time. Eyes: PERRL, lids and conjunctivae normal ENMT: Mucous membranes are moist. Posterior pharynx clear of any exudate or lesions.Normal dentition.  Neck: normal, supple, no masses, no  thyromegaly Respiratory: clear to auscultation bilaterally, no wheezing, no crackles. Normal respiratory effort. No accessory muscle use.  Cardiovascular: Regular rate and rhythm, no murmurs / rubs / gallops. No extremity edema. 2+ pedal pulses. No carotid bruits.  Abdomen: no tenderness, no masses palpated.  PEG tube in place.  Bowel sounds positive.  Musculoskeletal: no clubbing / cyanosis. No joint deformity upper and lower extremities. Good ROM, no contractures. Normal muscle tone.  Skin: no rashes, lesions, ulcers. No induration Neurologic: CN 2-12 grossly intact.  Strength is 3-4/5 on the left upper and lower extremity.  Strength 5/5 in the right upper and lower extremity.  Patient with  mild expressive aphasia. Psychiatric: Normal judgment and insight. Alert and oriented x 3. Normal mood.   Data Reviewed:  Reviewed  medications,  Assessment and Plan:  Complication of PEG tube Patient presented due to inability to her PEG tube and reports of abdominal pain.  IR had been consulted and noted there was an issue with the adapter which was placed and it was able to be flushed. -Admit to a medical telemetry bed -Resume tube feeds overnight and ensure no issues -Appreciate IR consultative services  Stercoral colitis Fecal impaction Acute.  CT scan of the abdomen pelvis at noted fecal impaction and stercoral colitis.  Patient reported last bowel movement was yesterday.  Started on empiric antibiotics of patient had been ciprofloxacin and metronidazole IV. -Monitor intake and output  -Smog enema -Senokot-S twice daily -Continue metronidazole -Changed ciprofloxacin to Rocephin  Pancytopenia Acute.  On admission hemoglobin noted to be 0.6 with platelet count 143.  Hemoglobin noted to be 11.9 when checked on 1/8 with normal platelet count. -Type and screen for possible need of blood products -Recheck H&H  ESRD on hemodialysis Patient normally dialyzes Monday, Wednesday, Friday.  Labs  noted potassium 3.5, BUN 5, creatinine 1.95. -Check renal function panel in a.m. -Consider need to formally consult nephrology if needing prolonged stay -Otherwise have patient follow-up at her normal dialysis center  Essential hypertension Blood pressures noted to be elevated up to 146/89. -Continue Coreg  History of CVA with residual left-sided hemiparesis Dysphagia -Continue Eliquis  Heart failure with reduced ejection fraction Last echocardiogram noted EF to be approximately 40-45% in 11/2022. -Monitor intake and output  Diabetes mellitus type 2, with long-term use of insulin Last available hemoglobin A1c was 6.3 when checked on 03/25/2023. -Hypoglycemic protocols -Pharmacy substitution of Semglee for Lantus reduced to 15 units nightly -CBGs every 6 hours sensitive SSI -Adjust insulin regimen as needed -Adjust insulin regimen as needed  History of DVT S/p IVC filter -Continue Eliquis  DVT prophylaxis: Eliquis Advance Care Planning:   Code Status: Full Code   Consults: IR  Family Communication: Daughters updated over the phone Severity of Illness: The appropriate patient status for this patient is OBSERVATION. Observation status is judged to be reasonable and necessary in order to provide the required intensity of service to ensure the patient's safety. The patient's presenting symptoms, physical exam findings, and initial radiographic and laboratory data in the context of their medical condition is felt to place them at decreased risk for further clinical deterioration. Furthermore, it is anticipated that the patient will be medically stable for discharge from the hospital within 2 midnights of admission.   Author: Clydie Braun, MD 09/25/2023 8:26 AM  For on call review [email or web address].

## 2023-09-26 DIAGNOSIS — I69391 Dysphagia following cerebral infarction: Secondary | ICD-10-CM | POA: Diagnosis not present

## 2023-09-26 DIAGNOSIS — K5289 Other specified noninfective gastroenteritis and colitis: Secondary | ICD-10-CM | POA: Diagnosis present

## 2023-09-26 DIAGNOSIS — I252 Old myocardial infarction: Secondary | ICD-10-CM | POA: Diagnosis not present

## 2023-09-26 DIAGNOSIS — N2581 Secondary hyperparathyroidism of renal origin: Secondary | ICD-10-CM | POA: Diagnosis present

## 2023-09-26 DIAGNOSIS — N186 End stage renal disease: Secondary | ICD-10-CM | POA: Diagnosis present

## 2023-09-26 DIAGNOSIS — E1165 Type 2 diabetes mellitus with hyperglycemia: Secondary | ICD-10-CM | POA: Diagnosis present

## 2023-09-26 DIAGNOSIS — K5641 Fecal impaction: Secondary | ICD-10-CM | POA: Diagnosis present

## 2023-09-26 DIAGNOSIS — I5022 Chronic systolic (congestive) heart failure: Secondary | ICD-10-CM | POA: Diagnosis present

## 2023-09-26 DIAGNOSIS — E8889 Other specified metabolic disorders: Secondary | ICD-10-CM | POA: Diagnosis present

## 2023-09-26 DIAGNOSIS — Z86718 Personal history of other venous thrombosis and embolism: Secondary | ICD-10-CM | POA: Diagnosis not present

## 2023-09-26 DIAGNOSIS — Z95828 Presence of other vascular implants and grafts: Secondary | ICD-10-CM | POA: Diagnosis not present

## 2023-09-26 DIAGNOSIS — I132 Hypertensive heart and chronic kidney disease with heart failure and with stage 5 chronic kidney disease, or end stage renal disease: Secondary | ICD-10-CM | POA: Diagnosis present

## 2023-09-26 DIAGNOSIS — Y838 Other surgical procedures as the cause of abnormal reaction of the patient, or of later complication, without mention of misadventure at the time of the procedure: Secondary | ICD-10-CM | POA: Diagnosis present

## 2023-09-26 DIAGNOSIS — K9423 Gastrostomy malfunction: Secondary | ICD-10-CM | POA: Diagnosis present

## 2023-09-26 DIAGNOSIS — Z886 Allergy status to analgesic agent status: Secondary | ICD-10-CM | POA: Diagnosis not present

## 2023-09-26 DIAGNOSIS — F03C0 Unspecified dementia, severe, without behavioral disturbance, psychotic disturbance, mood disturbance, and anxiety: Secondary | ICD-10-CM | POA: Diagnosis present

## 2023-09-26 DIAGNOSIS — Z833 Family history of diabetes mellitus: Secondary | ICD-10-CM | POA: Diagnosis not present

## 2023-09-26 DIAGNOSIS — E1122 Type 2 diabetes mellitus with diabetic chronic kidney disease: Secondary | ICD-10-CM | POA: Diagnosis present

## 2023-09-26 DIAGNOSIS — Z7901 Long term (current) use of anticoagulants: Secondary | ICD-10-CM | POA: Diagnosis not present

## 2023-09-26 DIAGNOSIS — R109 Unspecified abdominal pain: Secondary | ICD-10-CM | POA: Diagnosis present

## 2023-09-26 DIAGNOSIS — D61818 Other pancytopenia: Secondary | ICD-10-CM | POA: Diagnosis present

## 2023-09-26 DIAGNOSIS — I69354 Hemiplegia and hemiparesis following cerebral infarction affecting left non-dominant side: Secondary | ICD-10-CM | POA: Diagnosis not present

## 2023-09-26 DIAGNOSIS — E039 Hypothyroidism, unspecified: Secondary | ICD-10-CM | POA: Diagnosis present

## 2023-09-26 DIAGNOSIS — Z992 Dependence on renal dialysis: Secondary | ICD-10-CM | POA: Diagnosis not present

## 2023-09-26 DIAGNOSIS — Z794 Long term (current) use of insulin: Secondary | ICD-10-CM | POA: Diagnosis not present

## 2023-09-26 DIAGNOSIS — E1140 Type 2 diabetes mellitus with diabetic neuropathy, unspecified: Secondary | ICD-10-CM | POA: Diagnosis present

## 2023-09-26 LAB — RENAL FUNCTION PANEL
Albumin: 2.9 g/dL — ABNORMAL LOW (ref 3.5–5.0)
Anion gap: 12 (ref 5–15)
BUN: 14 mg/dL (ref 8–23)
CO2: 30 mmol/L (ref 22–32)
Calcium: 9.1 mg/dL (ref 8.9–10.3)
Chloride: 94 mmol/L — ABNORMAL LOW (ref 98–111)
Creatinine, Ser: 3.17 mg/dL — ABNORMAL HIGH (ref 0.44–1.00)
GFR, Estimated: 15 mL/min — ABNORMAL LOW (ref >60)
Glucose, Bld: 190 mg/dL — ABNORMAL HIGH (ref 70–99)
Phosphorus: 3.6 mg/dL (ref 2.5–4.6)
Potassium: 3.6 mmol/L (ref 3.5–5.1)
Sodium: 136 mmol/L (ref 135–145)

## 2023-09-26 LAB — CBC
HCT: 28.2 % — ABNORMAL LOW (ref 36.0–46.0)
Hemoglobin: 9 g/dL — ABNORMAL LOW (ref 12.0–15.0)
MCH: 30.9 pg (ref 26.0–34.0)
MCHC: 31.9 g/dL (ref 30.0–36.0)
MCV: 96.9 fL (ref 80.0–100.0)
Platelets: 142 10*3/uL — ABNORMAL LOW (ref 150–400)
RBC: 2.91 MIL/uL — ABNORMAL LOW (ref 3.87–5.11)
RDW: 17.3 % — ABNORMAL HIGH (ref 11.5–15.5)
WBC: 8.2 10*3/uL (ref 4.0–10.5)
nRBC: 0.2 % (ref 0.0–0.2)

## 2023-09-26 LAB — GLUCOSE, CAPILLARY
Glucose-Capillary: 192 mg/dL — ABNORMAL HIGH (ref 70–99)
Glucose-Capillary: 236 mg/dL — ABNORMAL HIGH (ref 70–99)
Glucose-Capillary: 223 mg/dL — ABNORMAL HIGH (ref 70–99)

## 2023-09-26 LAB — HEPATITIS B SURFACE ANTIGEN: Hepatitis B Surface Ag: NONREACTIVE

## 2023-09-26 NOTE — Progress Notes (Signed)
PROGRESS NOTE    Samantha [name]  ZOX:[id number] DOB: October 29, 1945 DOA: 09/24/2023 PCP: [name], MD   Brief Narrative:  This 78 yrs old female with medical history significant of hypertension, CVA with residual left-sided hemiparesis, dysphagia  s/p PEG, ESRD on HD, diabetes mellitus type 2, hypothyroidism, and severe dementia who presents due to PEG tube being clogged. She reported having abdominal pain localized to the area around her PEG tube. She is on a regular diet and receives tube feeds at night. She missed her tube feeds yesterday due to being clogged. Daughter reports her having constipation and bloating that she has been giving her Senokot.  She was afebrile, mildly tachypneic in the ED other vitals were stable.  CT A&P showed showed fecal impaction and stercoral colitis. Patient was admitted and started on empiric antibiotics.  IR consulted,  PEG tube was exchanged.  Tube feeds resumed.   Assessment & Plan:   Principal Problem:   PEG tube malfunction (HCC) Active Problems:   Fecal impaction (HCC)   Stercoral colitis   Pancytopenia (HCC)   End-stage renal disease on hemodialysis (HCC)   History of CVA with residual deficit   Heart failure with reduced ejection fraction (HCC)   History of DVT (deep vein thrombosis)   DM2 (diabetes mellitus, type 2) (HCC)   Essential hypertension   PEG tube clogging: Patient presented with abdominal pain and found to have clogged PEG tube.   IR had been consulted and noted there was an issue with the adapter which was placed and it was able to be flushed. Tube feeds resumed and there is no issue noted. Appreciated IR consultation.   Stercoral colitis / Fecal impaction: Acute. CT scan A/P showed fecal impaction and stercoral colitis.   Started on empiric antibiotics (Ciprofloxacin and metronidazole IV) Monitor intake and output  Smog enema Senokot-S twice daily Continue metronidazole Changed ciprofloxacin to Rocephin.    ESRD on hemodialysis Patient gets hemodialysis Monday Wednesdays and Fridays. Nephrology consulted for continuation of hemodialysis.   Essential hypertension: Blood pressures noted to be elevated up to 146/89. Continue Coreg.   History of CVA with residual left-sided hemiparesis: Dysphagia Continue Eliquis   Heart failure with reduced ejection fraction: Last echo noted EF to be approximately 40-45% in 11/2022. -Monitor intake and output   DM2 with hyperglycemia: Last Hb A1c was 6.3 when checked on 03/25/2023. Hypoglycemic protocols Continue Semglee 15 units nightly -CBGs every 6 hours sensitive SSI -Adjust insulin regimen as needed -Adjust insulin regimen as needed   History of DVT: S/p IVC filter Continue Eliquis   DVT prophylaxis: Eliquis Code Status: Full code Family Communication: No family at bedside Disposition Plan:    Status is: Observation The patient remains OBS appropriate and will d/c before 2 midnights.   Admitted for PEG tube clogging and stercoral colitis started on empiric antibiotics.   Consultants:  IR  Procedures: None.  Antimicrobials: Anti-infectives (From admission, onward)    Start     Dose/Rate Route Frequency Ordered Stop   09/25/23 2200  metroNIDAZOLE (FLAGYL) IVPB 500 mg        500 mg 100 mL/hr over 60 Minutes Intravenous Every 12 hours 09/25/23 0843     09/25/23 2200  ciprofloxacin (CIPRO) IVPB 400 mg  Status:  Discontinued        400 mg 200 mL/hr over 60 Minutes Intravenous Every 12 hours 09/25/23 0843 09/25/23 0950   09/25/23 2200  cefTRIAXone (ROCEPHIN) 2 g in sodium chloride 0.9 % 100 mL IVPB  2 g 200 mL/hr over 30 Minutes Intravenous Every 24 hours 09/25/23 0951     09/25/23 0530  ciprofloxacin (CIPRO) IVPB 400 mg        400 mg 200 mL/hr over 60 Minutes Intravenous  Once 09/25/23 0529 09/25/23 0750   09/25/23 0530  metroNIDAZOLE (FLAGYL) IVPB 500 mg        500 mg 100 mL/hr over 60 Minutes Intravenous  Once 09/25/23  0529 09/25/23 0911       Subjective: Patient was seen and examined at bedside.  Overnight events noted. Patient reports feeling better,  PEG tube is functioning now,   tube feeding were resumed.  Objective: Vitals:   09/25/23 1515 09/25/23 2340 09/26/23 0716 09/26/23 0805  BP: (!) 143/79 99/65  118/68  Pulse: 89 93  94  Resp: 20 16  18   Temp: 99 F (37.2 C) 98.4 F (36.9 C)    TempSrc: Oral Axillary    SpO2: 100% 100%  98%  Weight: 56.5 kg  56.4 kg     Intake/Output Summary (Last 24 hours) at 09/26/2023 1210 Last data filed at 09/25/2023 1800 Gross per 24 hour  Intake 46.67 ml  Output 60 ml  Net -13.33 ml   Filed Weights   09/25/23 0944 09/25/23 1515 09/26/23 0716  Weight: 54.4 kg 56.5 kg 56.4 kg    Examination:  General exam: Appears calm and comfortable , deconditioned, not in any acute distress. Respiratory system: CTA Bilaterally . Respiratory effort normal.  RR 15 Cardiovascular system: S1 & S2 heard, RRR. No JVD, murmurs, rubs, gallops or clicks.  Gastrointestinal system: Abdomen is non distended, soft and non tender. Normal bowel sounds heard. Central nervous system: Alert and oriented x 3. No focal neurological deficits. Extremities: No edema, no cyanosis, no clubbing Skin: No rashes, lesions or ulcers Psychiatry: Judgement and insight appear normal. Mood & affect appropriate.     Data Reviewed: I have personally reviewed following labs and imaging studies  CBC: Recent Labs  Lab 09/25/23 0206 09/25/23 1239 09/26/23 0605  WBC 8.1  --  8.2  NEUTROABS 3.9  --   --   HGB 9.6* 10.1* 9.0*  HCT 31.4* 32.6* 28.2*  MCV 99.1  --  96.9  PLT 143*  --  142*   Basic Metabolic Panel: Recent Labs  Lab 09/25/23 0206 09/26/23 0605  NA 138 136  K 3.5 3.6  CL 95* 94*  CO2 34* 30  GLUCOSE 102* 190*  BUN 5* 14  CREATININE 1.95* 3.17*  CALCIUM 9.0 9.1  PHOS  --  3.6   GFR: Estimated Creatinine Clearance: 11.4 mL/min (A) (by C-G formula based on SCr of 3.17  mg/dL (H)). Liver Function Tests: Recent Labs  Lab 09/25/23 0206 09/26/23 0605  AST 30  --   ALT 20  --   ALKPHOS 153*  --   BILITOT 0.4  --   PROT 6.6  --   ALBUMIN 3.2* 2.9*   Recent Labs  Lab 09/25/23 0206  LIPASE 60*   No results for input(s): "AMMONIA" in the last 168 hours. Coagulation Profile: No results for input(s): "INR", "PROTIME" in the last 168 hours. Cardiac Enzymes: No results for input(s): "CKTOTAL", "CKMB", "CKMBINDEX", "TROPONINI" in the last 168 hours. BNP (last 3 results) No results for input(s): "PROBNP" in the last 8760 hours. HbA1C: Recent Labs    09/25/23 2117  HGBA1C 6.6*   CBG: Recent Labs  Lab 09/25/23 1146 09/25/23 1511 09/25/23 2307 09/26/23 0730 09/26/23 1201  GLUCAP  122* 138* 177* 192* 236*   Lipid Profile: No results for input(s): "CHOL", "HDL", "LDLCALC", "TRIG", "CHOLHDL", "LDLDIRECT" in the last 72 hours. Thyroid Function Tests: No results for input(s): "TSH", "T4TOTAL", "FREET4", "T3FREE", "THYROIDAB" in the last 72 hours. Anemia Panel: No results for input(s): "VITAMINB12", "FOLATE", "FERRITIN", "TIBC", "IRON", "RETICCTPCT" in the last 72 hours. Sepsis Labs: No results for input(s): "PROCALCITON", "LATICACIDVEN" in the last 168 hours.  No results found for this or any previous visit (from the past 240 hours).       Radiology Studies: CT ABDOMEN PELVIS WO CONTRAST Result Date: 09/25/2023 CLINICAL DATA:  Diverticulitis, complication suspected EXAM: CT ABDOMEN AND PELVIS WITHOUT CONTRAST TECHNIQUE: Multidetector CT imaging of the abdomen and pelvis was performed following the standard protocol without IV contrast. RADIATION DOSE REDUCTION: This exam was performed according to the departmental dose-optimization program which includes automated exposure control, adjustment of the mA and/or kV according to patient size and/or use of iterative reconstruction technique. COMPARISON:  09/01/2023 FINDINGS: Lower chest: No acute  abnormality. Hepatobiliary: Cholecystostomy tube remains in place. Decompressed gallbladder containing a calcified gallstone. No biliary dilation. Unremarkable noncontrast appearance of the liver. Pancreas: Unremarkable. Spleen: Unremarkable. Adrenals/Urinary Tract: Stable adrenal glands. The hyperdensity in the left mid kidney is less conspicuous today. No urinary calculi or hydronephrosis. Bladder is nondistended. Stomach/Bowel: Moderate stool ball in the rectum, increased from prior with rectal wall thickening and trace adjacent perirectal fat stranding. Normal caliber large and small bowel without bowel wall thickening. Percutaneous gastrostomy tube. Normal appendix. Vascular/Lymphatic: IVC filter.  No lymphadenopathy. Reproductive: Hysterectomy.  No adnexal mass. Other: No free intraperitoneal air.  No abscess. Musculoskeletal: No acute fracture. IMPRESSION: Findings concerning for fecal impaction and stercoral colitis. Unchanged position of the cholecystostomy tube in the decompressed gallbladder. Cholelithiasis. Electronically Signed   By: Minerva Fester M.D.   On: 09/25/2023 01:39   Scheduled Meds:  apixaban  5 mg Per Tube BID   carvedilol  3.125 mg Per Tube BID WC   Chlorhexidine Gluconate Cloth  6 each Topical Daily   insulin aspart  0-9 Units Subcutaneous Q6H   insulin glargine-yfgn  15 Units Subcutaneous QHS   multivitamin  1 tablet Oral QHS   senna-docusate  1 tablet Oral BID   sodium chloride flush  10-40 mL Intracatheter Q12H   sodium chloride flush  3 mL Intravenous Q12H   Continuous Infusions:  cefTRIAXone (ROCEPHIN)  IV 2 g (09/25/23 2355)   feeding supplement (OSMOLITE 1.2 CAL) 1,000 mL (09/26/23 0700)   metronidazole 500 mg (09/26/23 0829)     LOS: 0 days    Time spent: 50 mins    Willeen Niece, MD [facility]   If 7PM-7AM, please contact night-coverage

## 2023-09-26 NOTE — Consult Note (Signed)
[facility] Renal Consultation Note    Indication for Consultation:  Management of ESRD/hemodialysis; anemia, hypertension/volume and secondary hyperparathyroidism PCP: Dr. Jerrell Mylar Nephrologist: Dr. Romelle Starcher  HPI: Samantha [name] is a 78 y.o. female with ESRD on hemodialysis MWF at [facility]. PMH: DMT2, HTN, CVA, dementia. Moved her from [place] to be close to daughter. Last HD 09/24/2023 ran full treatment, got slightly under OP EDW.  She presented to ED 09/25/2023 for issues with peg tube. Peg tube tube has since been adjusted by IR and is now functional. Also noted to have issues with sterocaral colitis, fecal impaction. She has been admitted as observation patient. We will manage and adjust hemodialysis.   Seen in room. She opens eyes, makes brief eye contact then closes them again. She denies pain or discomfort.   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   Dementia (HCC)    Diabetes (HCC)    Diabetes mellitus without complication (HCC)    ESRD (end stage renal disease) on dialysis (HCC)    Heart failure (HCC)    History of CT scan    History of heart attack    History of MRI    History of stroke    HTN (hypertension)    Kidney failure    PEG (percutaneous endoscopic gastrostomy) status (HCC)    Thyroid disease    Past Surgical History:  Procedure Laterality Date   ABDOMINAL HYSTERECTOMY     GALLBLADDER SURGERY     IR EXCHANGE BILIARY DRAIN  02/11/2023   IR EXCHANGE BILIARY DRAIN  04/13/2023   IR EXCHANGE BILIARY DRAIN  06/15/2023   IR EXCHANGE BILIARY DRAIN  08/23/2023   IR PERC CHOLECYSTOSTOMY  12/16/2022   IR REPLACE G-TUBE SIMPLE WO FLUORO  08/23/2023   IR REPLC GASTRO/COLONIC TUBE PERCUT W/FLUORO  06/15/2023   Family History  Problem Relation Age of Onset   Diabetes Mother    Social History:  reports that she has never smoked. She does not have any  smokeless tobacco history on file. She reports that she does not currently use alcohol. She reports that she does not use drugs. Allergies  Allergen Reactions   Asa [Aspirin] Anaphylaxis   Penicillins Anaphylaxis    **Tolerates cephalosporins   Prior to Admission medications   Medication Sig Start Date End Date Taking? Authorizing Provider  acetaminophen (TYLENOL) 500 MG tablet Place 500 mg into feeding tube every 6 (six) hours as needed for mild pain.   Yes [provider]  apixaban (ELIQUIS) 5 MG TABS tablet Place 1 tablet (5 mg total) into feeding tube 2 (two) times daily. 04/27/23  Yes Salvatore Decent, FNP  carvedilol (COREG) 3.125 MG tablet Place 1 tablet (3.125 mg total) into feeding tube 2 (two) times daily with a meal. Take 3.125 mg Q AM  and 3.125 mg Q PM on non dialysis days (Tues, Thurs, Sat and Sun,   - On dialysis days,  MWF, continue Coreg 3.125 mg Q PM -  Hold for SBP < 100 07/02/23  Yes Medina-Vargas, Monina C, NP  Glucagon (BAQSIMI ONE PACK) 3 MG/DOSE POWD Place 1 packet into the nose as needed (For blood sugar). Use when blood sugar drops below 70.   Yes [provider]  insulin aspart (NOVOLOG) 100 UNIT/ML injection 0-9 Units, Subcutaneous, 3 times daily with meals, CBG < 70: Implement Hypoglycemia measures CBG 70 - 120: 0 units CBG  121 - 150: 1 unit CBG 151 - 200: 2 units CBG 201 - 250: 3 units CBG 251 - 300: 5 units CBG 301 - 350: 7 units CBG 351 - 400: 9 units CBG > 400: call MD 12/25/22  Yes Ghimire, Werner Lean, MD  insulin glargine (LANTUS) 100 UNIT/ML Solostar Pen Inject 20 Units into the skin at bedtime.   Yes [provider]  multivitamin (RENA-VIT) TABS tablet Take 1 tablet by mouth at bedtime. 12/25/22  Yes Ghimire, Werner Lean, MD  Nutritional Supplements (FEEDING SUPPLEMENT, OSMOLITE 1.5 CAL,) LIQD Place 1,000 mLs into feeding tube See admin instructions. 8p-6a   Yes [provider]  promethazine (PHENERGAN) 25 MG tablet Take 0.5 tablets (12.5  mg total) by mouth every 6 (six) hours as needed for nausea or vomiting. 09/01/23  Yes Durwin Glaze, MD  senna (SENOKOT) 8.6 MG TABS tablet Take 1 tablet (8.6 mg total) by mouth daily. Patient taking differently: Take 1 tablet by mouth every evening. 09/01/23  Yes Durwin Glaze, MD   Current Facility-Administered Medications  Medication Dose Route Frequency Provider Last Rate Last Admin   apixaban (ELIQUIS) tablet 5 mg  5 mg Per Tube BID Madelyn Flavors A, MD   5 mg at 09/26/23 0824   carvedilol (COREG) tablet 3.125 mg  3.125 mg Per Tube BID WC Smith, Rondell A, MD   3.125 mg at 09/26/23 0824   cefTRIAXone (ROCEPHIN) 2 g in sodium chloride 0.9 % 100 mL IVPB  2 g Intravenous Q24H Katrinka Blazing, Rondell A, MD 200 mL/hr at 09/25/23 2355 2 g at 09/25/23 2355   Chlorhexidine Gluconate Cloth 2 % PADS 6 each  6 each Topical Daily Madelyn Flavors A, MD   6 each at 09/26/23 1046   feeding supplement (OSMOLITE 1.2 CAL) liquid 1,000 mL  1,000 mL Per Tube Continuous Smith, Rondell A, MD 40 mL/hr at 09/26/23 0700 1,000 mL at 09/26/23 0700   insulin aspart (novoLOG) injection 0-9 Units  0-9 Units Subcutaneous Q6H Madelyn Flavors A, MD   2 Units at 09/26/23 0823   insulin glargine-yfgn (SEMGLEE) injection 15 Units  15 Units Subcutaneous QHS Madelyn Flavors A, MD   15 Units at 09/26/23 0050   metroNIDAZOLE (FLAGYL) IVPB 500 mg  500 mg Intravenous Q12H Smith, Rondell A, MD 100 mL/hr at 09/26/23 0829 500 mg at 09/26/23 1610   multivitamin (RENA-VIT) tablet 1 tablet  1 tablet Oral QHS Madelyn Flavors A, MD   1 tablet at 09/26/23 0007   senna-docusate (Senokot-S) tablet 1 tablet  1 tablet Oral BID Madelyn Flavors A, MD   1 tablet at 09/26/23 0007   sodium chloride flush (NS) 0.9 % injection 10-40 mL  10-40 mL Intracatheter Q12H Smith, Rondell A, MD   10 mL at 09/26/23 0000   sodium chloride flush (NS) 0.9 % injection 10-40 mL  10-40 mL Intracatheter PRN Madelyn Flavors A, MD       sodium chloride flush (NS) 0.9 % injection 3 mL  3 mL  Intravenous Q12H Madelyn Flavors A, MD   3 mL at 09/25/23 2355   Labs: Basic Metabolic Panel: Recent Labs  Lab 09/25/23 0206 09/26/23 0605  NA 138 136  K 3.5 3.6  CL 95* 94*  CO2 34* 30  GLUCOSE 102* 190*  BUN 5* 14  CREATININE 1.95* 3.17*  CALCIUM 9.0 9.1  PHOS  --  3.6   Liver Function Tests: Recent Labs  Lab 09/25/23 0206 09/26/23 0605  AST 30  --  ALT 20  --   ALKPHOS 153*  --   BILITOT 0.4  --   PROT 6.6  --   ALBUMIN 3.2* 2.9*   Recent Labs  Lab 09/25/23 0206  LIPASE 60*   No results for input(s): "AMMONIA" in the last 168 hours. CBC: Recent Labs  Lab 09/25/23 0206 09/25/23 1239 09/26/23 0605  WBC 8.1  --  8.2  NEUTROABS 3.9  --   --   HGB 9.6* 10.1* 9.0*  HCT 31.4* 32.6* 28.2*  MCV 99.1  --  96.9  PLT 143*  --  142*   Cardiac Enzymes: No results for input(s): "CKTOTAL", "CKMB", "CKMBINDEX", "TROPONINI" in the last 168 hours. CBG: Recent Labs  Lab 09/25/23 0452 09/25/23 1146 09/25/23 1511 09/25/23 2307 09/26/23 0730  GLUCAP 140* 122* 138* 177* 192*   Iron Studies: No results for input(s): "IRON", "TIBC", "TRANSFERRIN", "FERRITIN" in the last 72 hours. Studies/Results: CT ABDOMEN PELVIS WO CONTRAST Result Date: 09/25/2023 CLINICAL DATA:  Diverticulitis, complication suspected EXAM: CT ABDOMEN AND PELVIS WITHOUT CONTRAST TECHNIQUE: Multidetector CT imaging of the abdomen and pelvis was performed following the standard protocol without IV contrast. RADIATION DOSE REDUCTION: This exam was performed according to the departmental dose-optimization program which includes automated exposure control, adjustment of the mA and/or kV according to patient size and/or use of iterative reconstruction technique. COMPARISON:  09/01/2023 FINDINGS: Lower chest: No acute abnormality. Hepatobiliary: Cholecystostomy tube remains in place. Decompressed gallbladder containing a calcified gallstone. No biliary dilation. Unremarkable noncontrast appearance of the liver.  Pancreas: Unremarkable. Spleen: Unremarkable. Adrenals/Urinary Tract: Stable adrenal glands. The hyperdensity in the left mid kidney is less conspicuous today. No urinary calculi or hydronephrosis. Bladder is nondistended. Stomach/Bowel: Moderate stool ball in the rectum, increased from prior with rectal wall thickening and trace adjacent perirectal fat stranding. Normal caliber large and small bowel without bowel wall thickening. Percutaneous gastrostomy tube. Normal appendix. Vascular/Lymphatic: IVC filter.  No lymphadenopathy. Reproductive: Hysterectomy.  No adnexal mass. Other: No free intraperitoneal air.  No abscess. Musculoskeletal: No acute fracture. IMPRESSION: Findings concerning for fecal impaction and stercoral colitis. Unchanged position of the cholecystostomy tube in the decompressed gallbladder. Cholelithiasis. Electronically Signed   By: Minerva Fester M.D.   On: 09/25/2023 01:39    ROS: As per HPI otherwise negative.   Physical Exam: Vitals:   09/25/23 1515 09/25/23 2340 09/26/23 0716 09/26/23 0805  BP: (!) 143/79 99/65  118/68  Pulse: 89 93  94  Resp: 20 16  18   Temp: 99 F (37.2 C) 98.4 F (36.9 C)    TempSrc: Oral Axillary    SpO2: 100% 100%  98%  Weight: 56.5 kg  56.4 kg      General: Chronically ill appearing female in no acute distress. Head: Normocephalic, atraumatic, sclera non-icteric, mucus membranes are moist Neck: Supple. JVD not elevated. Lungs: Clear bilaterally to auscultation without wheezes, rales, or rhonchi. Breathing is unlabored. Heart: RRR with S1 S2. No murmurs, rubs, or gallops appreciated. Abdomen: NABS, NT. Peg tube in place.  Lower extremities: No LE Edema Neuro: Alert and oriented X 2. Chronic L hemiparesis. Psych:  Responds to questions appropriately with a normal affect. Dialysis Access: TDC drsg CDI  Dialysis Orders: [facility] MWF 3:45 hrs 180NRe 350/600 58.4 2.0 K/ 2.0 Ca TDC - Heparin 2500 units IV initial bolus Heparin 1500 units IV mid  run - Mircera 30 mcg IV q 2 weeks (last dose 09/20/2023)  Assessment/Plan:  Peg Tube Dysfunction: Resolved with assistance from IR. Per primary Stercoral  Colitis/Fecal impaction-per primary  ESRD -  MWF next HD 09/27/2023. K+ 3.6 Use 3.0 K bath.   Hypertension/volume  - BP controlled. Volume acceptable. UF as tolerated.   Anemia  - HGB 9.0 Recent ESA dose. Finished Fe load at OP clinic 09/22/2023  Metabolic bone disease -  PO4 at goal. No VDRA. No binders  Nutrition - Albumin low. Add protein supplements. On TF.   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  DMT2-per primary  Dene Gentry. Manson Passey, NP-C 09/26/2023, 11:26 AM  [employer] [phone]

## 2023-09-26 NOTE — Consult Note (Signed)
WOC Nurse Consult Note: Reason for Consult: pressure injury sacrum Wound type: Stage 2 Pressure Injury Pressure Injury POA: Yes Measurement: per nursing FS 2cm x 2cm x Wound ZOX:WRUE Drainage (amount, consistency, odor) no drainage documented Periwound: intact  Dressing procedure/placement/frequency: Per nursing skin care order set;  :Cleanse sacral wound with saline, pat dry Cover with single layer of xeroform gauze, top with foam Change every other day Turn and reposition per hospital protocol.  Discussed POC with patient and bedside nurse.  Re consult if needed, will not follow at this time. Thanks  Stewart Sasaki [employer], RN,CWOCN, CNS, CWON-AP [phone])

## 2023-09-26 NOTE — Discharge Instructions (Signed)
 Information on my medicine - ELIQUIS (apixaban)  This medication education was reviewed with me or my healthcare representative as part of my discharge preparation.    Why was Eliquis prescribed for you? Eliquis was prescribed to treat blood clots that may have been found in the veins of your legs (deep vein thrombosis) or in your lungs (pulmonary embolism) and to reduce the risk of them occurring again.  What do You need to know about Eliquis ? Continue Eliquis 5 mg tablet taken TWICE daily.  Eliquis may be taken with or without food.   Try to take the dose about the same time in the morning and in the evening. If you have difficulty swallowing the tablet whole please discuss with your pharmacist how to take the medication safely.  Take Eliquis exactly as prescribed and DO NOT stop taking Eliquis without talking to the doctor who prescribed the medication.  Stopping may increase your risk of developing a new blood clot.  Refill your prescription before you run out.  After discharge, you should have regular check-up appointments with your healthcare provider that is prescribing your Eliquis.    What do you do if you miss a dose? If a dose of ELIQUIS is not taken at the scheduled time, take it as soon as possible on the same day and twice-daily administration should be resumed. The dose should not be doubled to make up for a missed dose.  Important Safety Information A possible side effect of Eliquis is bleeding. You should call your healthcare provider right away if you experience any of the following: Bleeding from an injury or your nose that does not stop. Unusual colored urine (red or dark brown) or unusual colored stools (red or black). Unusual bruising for unknown reasons. A serious fall or if you hit your head (even if there is no bleeding).  Some medicines may interact with Eliquis and might increase your risk of bleeding or clotting while on Eliquis. To help avoid this,  consult your healthcare provider or pharmacist prior to using any new prescription or non-prescription medications, including herbals, vitamins, non-steroidal anti-inflammatory drugs (NSAIDs) and supplements.  This website has more information on Eliquis (apixaban): http://www.eliquis.com/eliquis/home

## 2023-09-27 DIAGNOSIS — K9423 Gastrostomy malfunction: Secondary | ICD-10-CM | POA: Diagnosis not present

## 2023-09-27 LAB — CBC
HCT: 26.1 % — ABNORMAL LOW (ref 36.0–46.0)
Hemoglobin: 8.2 g/dL — ABNORMAL LOW (ref 12.0–15.0)
MCH: 30.6 pg (ref 26.0–34.0)
MCHC: 31.4 g/dL (ref 30.0–36.0)
MCV: 97.4 fL (ref 80.0–100.0)
Platelets: 130 10*3/uL — ABNORMAL LOW (ref 150–400)
RBC: 2.68 MIL/uL — ABNORMAL LOW (ref 3.87–5.11)
RDW: 17.8 % — ABNORMAL HIGH (ref 11.5–15.5)
WBC: 6.7 10*3/uL (ref 4.0–10.5)
nRBC: 0 % (ref 0.0–0.2)

## 2023-09-27 LAB — GLUCOSE, CAPILLARY
Glucose-Capillary: 147 mg/dL — ABNORMAL HIGH (ref 70–99)
Glucose-Capillary: 196 mg/dL — ABNORMAL HIGH (ref 70–99)
Glucose-Capillary: 221 mg/dL — ABNORMAL HIGH (ref 70–99)
Glucose-Capillary: 238 mg/dL — ABNORMAL HIGH (ref 70–99)
Glucose-Capillary: 257 mg/dL — ABNORMAL HIGH (ref 70–99)

## 2023-09-27 LAB — RENAL FUNCTION PANEL
Albumin: 2.8 g/dL — ABNORMAL LOW (ref 3.5–5.0)
Anion gap: 13 (ref 5–15)
BUN: 21 mg/dL (ref 8–23)
CO2: 32 mmol/L (ref 22–32)
Calcium: 8.8 mg/dL — ABNORMAL LOW (ref 8.9–10.3)
Chloride: 94 mmol/L — ABNORMAL LOW (ref 98–111)
Creatinine, Ser: 4.01 mg/dL — ABNORMAL HIGH (ref 0.44–1.00)
GFR, Estimated: 11 mL/min — ABNORMAL LOW (ref >60)
Glucose, Bld: 242 mg/dL — ABNORMAL HIGH (ref 70–99)
Phosphorus: 4.1 mg/dL (ref 2.5–4.6)
Potassium: 4 mmol/L (ref 3.5–5.1)
Sodium: 139 mmol/L (ref 135–145)

## 2023-09-27 LAB — MAGNESIUM: Magnesium: 2.1 mg/dL (ref 1.7–2.4)

## 2023-09-27 LAB — HEPATITIS B SURFACE ANTIBODY, QUANTITATIVE: Hep B S AB Quant (Post): 712 m[IU]/mL

## 2023-09-27 MED ORDER — SMOG ENEMA
300.0000 mL | Freq: Once | RECTAL | Status: DC
  Filled 2023-09-27: qty 960

## 2023-09-27 MED ORDER — METRONIDAZOLE 500 MG PO TABS
500.0000 mg | ORAL_TABLET | Freq: Two times a day (BID) | ORAL | 0 refills | Status: AC
Start: 2023-09-27 — End: 2023-09-30

## 2023-09-27 MED ORDER — HEPARIN SODIUM (PORCINE) 1000 UNIT/ML IJ SOLN
INTRAMUSCULAR | Status: AC
  Administered 2023-09-27: 4000 [IU] via INTRAVENOUS
  Filled 2023-09-27: qty 4

## 2023-09-27 MED ORDER — CIPROFLOXACIN HCL 250 MG PO TABS
250.0000 mg | ORAL_TABLET | Freq: Two times a day (BID) | ORAL | 0 refills | Status: AC
Start: 2023-09-27 — End: 2023-09-30

## 2023-09-27 MED ORDER — CARVEDILOL 3.125 MG PO TABS: ORAL_TABLET | ORAL | Status: DC

## 2023-09-27 MED ORDER — HEPARIN SODIUM (PORCINE) 1000 UNIT/ML IJ SOLN: 4000.0000 [IU] | Freq: Once | INTRAMUSCULAR | Status: AC

## 2023-09-27 MED ORDER — HEPARIN SODIUM (PORCINE) 1000 UNIT/ML IJ SOLN
4500.0000 [IU] | INTRAMUSCULAR | Status: DC | PRN
Start: 2023-09-27 — End: 2023-09-28
  Administered 2023-09-27: 4500 [IU] via INTRAVENOUS
  Filled 2023-09-27: qty 5
  Filled 2023-09-27 (×2): qty 4.5

## 2023-09-27 NOTE — Progress Notes (Signed)
Went over AVS with patient's daughter Otho Darner over the phone. Clarified with MD Idelle Leech that patient is to continue Lantus at 15units-patient's daughter verbalize understanding on this. A copy of AVS sent home with patient with PTA. Patient left unit around 1915pm.

## 2023-09-27 NOTE — Discharge Summary (Signed)
Physician Discharge Summary  Samantha [name] ZOX:[id number] DOB: 08-Apr-1946 DOA: 09/24/2023  PCP: [name], MD  Admit date: 09/24/2023  Discharge date: 09/27/2023  Admitted From: Home Disposition: Home  Recommendations for Outpatient Follow-up:  Follow up with PCP in 1-2 weeks. Please obtain BMP/CBC in one week. Advised to take Senokot and Colace for constipation. Advised to take ciprofloxacin and Flagyl for 3 days. Advised to follow-up with Nephrology for continuation of hemodialysis.  Home Health:None Equipment/Devices:None  Discharge Condition: Stable CODE STATUS:Full code Diet recommendation: Heart Healthy  Brief [facility] Course: This 78 yrs old female with medical history significant of hypertension, CVA with residual left-sided hemiparesis, dysphagia  s/p PEG, ESRD on HD, diabetes mellitus type 2, hypothyroidism, and severe dementia who presents due to PEG tube being clogged. She reported having abdominal pain localized to the area around her PEG tube. She is on a regular diet and receives tube feeds at night. She missed her tube feeds yesterday due to being clogged. Daughter reports her having constipation and bloating that she has been giving her Senokot. She was afebrile, mildly tachypneic in the ED,  other vitals were stable.  CT A&P showed showed fecal impaction and stercoral colitis. Patient was admitted and started on empiric antibiotics.  IR consulted,  PEG tube was exchanged.  Tube feeds resumed.  Patient has been feeling better, Patient had liquid bowel movement after having smog enema.  Patient was seen in the dialysis,  tolerating well.  Reports feeling better and wants to be discharged.  Patient is being discharged home after hemodialysis today.   Discharge Diagnoses:  Principal Problem:   PEG tube malfunction (HCC) Active Problems:   Fecal impaction (HCC)   Stercoral colitis   Pancytopenia (HCC)   End-stage renal disease on hemodialysis (HCC)    History of CVA with residual deficit   Heart failure with reduced ejection fraction (HCC)   History of DVT (deep vein thrombosis)   DM2 (diabetes mellitus, type 2) (HCC)   Essential hypertension  PEG tube clogging: Patient presented with abdominal pain and found to have clogged PEG tube.   IR had been consulted and noted there was an issue with the adapter which was placed and it was able to be flushed. Tube feeds resumed and there is no issue noted. Appreciated IR consultation.   Stercoral colitis / Fecal impaction: Acute. CT scan A/P showed fecal impaction and stercoral colitis.   Started on empiric antibiotics (Ciprofloxacin and metronidazole IV) Monitor intake and output  Smog enema Senokot-S twice daily Discharged on Flagyl and Cipro for 3 days.   ESRD on hemodialysis: Patient gets hemodialysis Monday Wednesdays and Fridays. Nephrology consulted for continuation of hemodialysis.   Essential hypertension: Blood pressures noted to be elevated up to 146/89. Continue Coreg.   History of CVA with residual left-sided hemiparesis: Dysphagia Continue Eliquis   Heart failure with reduced ejection fraction: Last echo noted EF to be approximately 40-45% in 11/2022. -Monitor intake and output   DM2 with hyperglycemia: Last Hb A1c was 6.3 when checked on 03/25/2023. Hypoglycemic protocols Continue Semglee 15 units nightly -CBGs every 6 hours sensitive SSI -Adjust insulin regimen as needed -Adjust insulin regimen as needed   History of DVT: S/p IVC filter Continue Eliquis    Discharge Instructions  Discharge Instructions     Call MD for:  difficulty breathing, headache or visual disturbances   Complete by: As directed    Call MD for:  persistant dizziness or light-headedness   Complete by: As directed  Call MD for:  persistant nausea and vomiting   Complete by: As directed    Diet - low sodium heart healthy   Complete by: As directed    Diet Carb Modified    Complete by: As directed    Discharge instructions   Complete by: As directed    Advised to follow-up with primary care physician in 1 week. Advised to take Senokot and Colace for constipation. Advised to take ciprofloxacin and Flagyl for 3 days. Advised to follow-up with nephrology for continuation of hemodialysis   Increase activity slowly   Complete by: As directed    No wound care   Complete by: As directed       Allergies as of 09/27/2023       Reactions   Asa [aspirin] Anaphylaxis   Penicillins Anaphylaxis   **Tolerates cephalosporins        Medication List     TAKE these medications    acetaminophen 500 MG tablet Commonly known as: TYLENOL Place 500 mg into feeding tube every 6 (six) hours as needed for mild pain.   apixaban 5 MG Tabs tablet Commonly known as: ELIQUIS Place 1 tablet (5 mg total) into feeding tube 2 (two) times daily.   Baqsimi One Pack 3 MG/DOSE Powd Generic drug: Glucagon Place 1 packet into the nose as needed (For blood sugar). Use when blood sugar drops below 70.   carvedilol 3.125 MG tablet Commonly known as: COREG Take 3.125 mg Q AM  and 3.125 mg Q PM on non dialysis days (Tues, Thurs, Sat and Sun,   - On dialysis days,  MWF, continue Coreg 3.125 mg Q PM -  Hold for SBP < 100 What changed:  how much to take how to take this when to take this   ciprofloxacin 250 MG tablet Commonly known as: CIPRO Take 1 tablet (250 mg total) by mouth 2 (two) times daily for 3 days.   feeding supplement (OSMOLITE 1.5 CAL) Liqd Place 1,000 mLs into feeding tube See admin instructions. 8p-6a   insulin aspart 100 UNIT/ML injection Commonly known as: novoLOG 0-9 Units, Subcutaneous, 3 times daily with meals, CBG < 70: Implement Hypoglycemia measures CBG 70 - 120: 0 units CBG 121 - 150: 1 unit CBG 151 - 200: 2 units CBG 201 - 250: 3 units CBG 251 - 300: 5 units CBG 301 - 350: 7 units CBG 351 - 400: 9 units CBG > 400: call MD   insulin glargine 100  UNIT/ML Solostar Pen Commonly known as: LANTUS Inject 20 Units into the skin at bedtime.   metroNIDAZOLE 500 MG tablet Commonly known as: Flagyl Take 1 tablet (500 mg total) by mouth 2 (two) times daily for 3 days.   multivitamin Tabs tablet Take 1 tablet by mouth at bedtime.   promethazine 25 MG tablet Commonly known as: PHENERGAN Take 0.5 tablets (12.5 mg total) by mouth every 6 (six) hours as needed for nausea or vomiting.   senna 8.6 MG Tabs tablet Commonly known as: SENOKOT Take 1 tablet (8.6 mg total) by mouth daily. What changed: when to take this        Follow-up Information     [name], MD Follow up in 1 week(s).   Specialty: Internal Medicine Contact information: [address] [place] [address] [phone]                Allergies  Allergen Reactions   Jonne Ply [Aspirin] Anaphylaxis   Penicillins Anaphylaxis    **  Tolerates cephalosporins    Consultations: None   Procedures/Studies: CT ABDOMEN PELVIS WO CONTRAST Result Date: 09/25/2023 CLINICAL DATA:  Diverticulitis, complication suspected EXAM: CT ABDOMEN AND PELVIS WITHOUT CONTRAST TECHNIQUE: Multidetector CT imaging of the abdomen and pelvis was performed following the standard protocol without IV contrast. RADIATION DOSE REDUCTION: This exam was performed according to the departmental dose-optimization program which includes automated exposure control, adjustment of the mA and/or kV according to patient size and/or use of iterative reconstruction technique. COMPARISON:  09/01/2023 FINDINGS: Lower chest: No acute abnormality. Hepatobiliary: Cholecystostomy tube remains in place. Decompressed gallbladder containing a calcified gallstone. No biliary dilation. Unremarkable noncontrast appearance of the liver. Pancreas: Unremarkable. Spleen: Unremarkable. Adrenals/Urinary Tract: Stable adrenal glands. The hyperdensity in the left mid kidney is less conspicuous today. No urinary calculi  or hydronephrosis. Bladder is nondistended. Stomach/Bowel: Moderate stool ball in the rectum, increased from prior with rectal wall thickening and trace adjacent perirectal fat stranding. Normal caliber large and small bowel without bowel wall thickening. Percutaneous gastrostomy tube. Normal appendix. Vascular/Lymphatic: IVC filter.  No lymphadenopathy. Reproductive: Hysterectomy.  No adnexal mass. Other: No free intraperitoneal air.  No abscess. Musculoskeletal: No acute fracture. IMPRESSION: Findings concerning for fecal impaction and stercoral colitis. Unchanged position of the cholecystostomy tube in the decompressed gallbladder. Cholelithiasis. Electronically Signed   By: Minerva Fester M.D.   On: 09/25/2023 01:39   CT ABDOMEN PELVIS WO CONTRAST Result Date: 09/01/2023 CLINICAL DATA:  Nausea abdominal pain EXAM: CT ABDOMEN AND PELVIS WITHOUT CONTRAST TECHNIQUE: Multidetector CT imaging of the abdomen and pelvis was performed following the standard protocol without IV contrast. RADIATION DOSE REDUCTION: This exam was performed according to the departmental dose-optimization program which includes automated exposure control, adjustment of the mA and/or kV according to patient size and/or use of iterative reconstruction technique. COMPARISON:  CT 03/31/2023 FINDINGS: Lower chest: Lung bases demonstrate scarring and or atelectasis at the bases. Cardiomegaly. Vascular catheter tip in the low atrial ventricular region as before. Cardiomegaly. Hepatobiliary: Cholecystostomy tube remains in place. No biliary dilatation. Decompressed gallbladder body and fundus with some fluid in the proximal gallbladder. Calcified stone. No inflammatory changes Pancreas: Atrophic.  No inflammation Spleen: Normal in size without focal abnormality. Adrenals/Urinary Tract: Adrenal glands are within normal limits. Cyst upper pole right kidney for which no imaging follow-up is recommended. Interval ovoid hyperdensity at the mid left  kidney measuring 2.3 cm on series 2, image 32. The bladder is decompressed Stomach/Bowel: Gastrostomy tube within the stomach. No dilated small bowel. No acute bowel wall thickening. Moderate retained feces in the rectum. Mild asymmetrical wall thickening at the left posterior rectum. Vascular/Lymphatic: Aortic atherosclerosis. No aneurysm. IVC filter similar in appearance. Reproductive: Hysterectomy.  No adnexal mass Other: Negative for pelvic effusion or free air. Musculoskeletal: No acute or suspicious osseous abnormality IMPRESSION: 1. Cholecystostomy tube remains in place with decompressed gallbladder body and fundus. Calcified stone in the gallbladder. No biliary dilatation. No inflammatory changes 2. Interval ovoid hyperdensity at the mid left kidney measuring 2.3 cm, possible hemorrhagic or proteinaceous cyst. This is not clearly identified on recent prior imaging and short interval CT or MRI follow-up is suggested to ensure resolution 3. Moderate retained feces in the rectum with mild asymmetrical wall thickening at the left posterior rectum, correlate with direct visualization. 4. Aortic atherosclerosis. Aortic Atherosclerosis (ICD10-I70.0). Electronically Signed   By: Jasmine Pang M.D.   On: 09/01/2023 22:07     Subjective: Patient was seen and examined at bedside.  Overnight events noted.  Patient reports doing much better,  wants to be discharged.  Discharge Exam: Vitals:   09/27/23 1246 09/27/23 1330  BP: (!) 109/57 110/61  Pulse: 88 92  Resp: (!) 25 18  Temp: 98.7 F (37.1 C) 98.6 F (37 C)  SpO2: 100% 100%   Vitals:   09/27/23 1225 09/27/23 1240 09/27/23 1246 09/27/23 1330  BP: (!) 104/50 (!) 106/59 (!) 109/57 110/61  Pulse: (!) 103 100 88 92  Resp: 19 (!) 22 (!) 25 18  Temp:   98.7 F (37.1 C) 98.6 F (37 C)  TempSrc:   Oral Oral  SpO2: 97% 100% 100% 100%  Weight:      Height:        General: Pt is alert, awake, not in acute distress Cardiovascular: RRR, S1/S2 +,  no rubs, no gallops Respiratory: CTA bilaterally, no wheezing, no rhonchi Abdominal: Soft, NT, ND, bowel sounds + Extremities: no edema, no cyanosis    The results of significant diagnostics from this hospitalization (including imaging, microbiology, ancillary and laboratory) are listed below for reference.     Microbiology: No results found for this or any previous visit (from the past 240 hours).   Labs: BNP (last 3 results) Recent Labs    06/16/23 2226  BNP 198.6*   Basic Metabolic Panel: Recent Labs  Lab 09/25/23 0206 09/26/23 0605 09/27/23 0406 09/27/23 0407  NA 138 136  --  139  K 3.5 3.6  --  4.0  CL 95* 94*  --  94*  CO2 34* 30  --  32  GLUCOSE 102* 190*  --  242*  BUN 5* 14  --  21  CREATININE 1.95* 3.17*  --  4.01*  CALCIUM 9.0 9.1  --  8.8*  MG  --   --  2.1  --   PHOS  --  3.6  --  4.1   Liver Function Tests: Recent Labs  Lab 09/25/23 0206 09/26/23 0605 09/27/23 0407  AST 30  --   --   ALT 20  --   --   ALKPHOS 153*  --   --   BILITOT 0.4  --   --   PROT 6.6  --   --   ALBUMIN 3.2* 2.9* 2.8*   Recent Labs  Lab 09/25/23 0206  LIPASE 60*   No results for input(s): "AMMONIA" in the last 168 hours. CBC: Recent Labs  Lab 09/25/23 0206 09/25/23 1239 09/26/23 0605 09/27/23 0406  WBC 8.1  --  8.2 6.7  NEUTROABS 3.9  --   --   --   HGB 9.6* 10.1* 9.0* 8.2*  HCT 31.4* 32.6* 28.2* 26.1*  MCV 99.1  --  96.9 97.4  PLT 143*  --  142* 130*   Cardiac Enzymes: No results for input(s): "CKTOTAL", "CKMB", "CKMBINDEX", "TROPONINI" in the last 168 hours. BNP: Invalid input(s): "POCBNP" CBG: Recent Labs  Lab 09/26/23 1635 09/27/23 0035 09/27/23 0431 09/27/23 0756 09/27/23 1331  GLUCAP 223* 221* 257* 238* 147*   D-Dimer No results for input(s): "DDIMER" in the last 72 hours. Hgb A1c Recent Labs    09/25/23 2117  HGBA1C 6.6*   Lipid Profile No results for input(s): "CHOL", "HDL", "LDLCALC", "TRIG", "CHOLHDL", "LDLDIRECT" in the last 72  hours. Thyroid function studies No results for input(s): "TSH", "T4TOTAL", "T3FREE", "THYROIDAB" in the last 72 hours.  Invalid input(s): "FREET3" Anemia work up No results for input(s): "VITAMINB12", "FOLATE", "FERRITIN", "TIBC", "IRON", "RETICCTPCT" in the last 72 hours. Urinalysis  Component Value Date/Time   COLORURINE YELLOW 09/25/2023 1814   APPEARANCEUR CLEAR 09/25/2023 1814   LABSPEC 1.010 09/25/2023 1814   PHURINE 7.0 09/25/2023 1814   GLUCOSEU NEGATIVE 09/25/2023 1814   HGBUR SMALL (A) 09/25/2023 1814   BILIRUBINUR NEGATIVE 09/25/2023 1814   KETONESUR NEGATIVE 09/25/2023 1814   PROTEINUR 100 (A) 09/25/2023 1814   NITRITE NEGATIVE 09/25/2023 1814   LEUKOCYTESUR TRACE (A) 09/25/2023 1814   Sepsis Labs Recent Labs  Lab 09/25/23 0206 09/26/23 0605 09/27/23 0406  WBC 8.1 8.2 6.7   Microbiology No results found for this or any previous visit (from the past 240 hours).   Time coordinating discharge: Over 30 minutes  SIGNED:   Willeen Niece, MD  [facility] 09/27/2023, 3:36 PM Pager   If 7PM-7AM, please contact night-coverage

## 2023-09-27 NOTE — Progress Notes (Signed)
[facility] Progress Note   Subjective:    Seen and examined patient on HD. Tolerating UFG 1.5L. BP is 115/86. She denies SOB, CP, and N/V.   Objective Vitals:   09/27/23 0753 09/27/23 0840 09/27/23 0849 09/27/23 0925  BP: 115/60 121/67 122/62 115/86  Pulse: 97 96 87 97  Resp: 17 (!) 21 19 (!) 25  Temp:  98.9 F (37.2 C)    TempSrc:      SpO2: 99% 98% 98% 100%  Weight:      Height:       Physical Exam General: Chronically ill-appearing; NAD Heart: S1 and S2; No MRGs Lungs: Clear anteriorly. Breathing is unlabored Abdomen: Soft and non-tender; PEG tube in place Extremities: No edema bilateral lower extremities Dialysis Access: [facility]   Filed Weights   09/25/23 0944 09/25/23 1515 09/26/23 0716  Weight: 54.4 kg 56.5 kg 56.4 kg    Intake/Output Summary (Last 24 hours) at 09/27/2023 0936 Last data filed at 09/26/2023 2112 Gross per 24 hour  Intake --  Output 0 ml  Net 0 ml    Additional Objective Labs: Basic Metabolic Panel: Recent Labs  Lab 09/25/23 0206 09/26/23 0605 09/27/23 0407  NA 138 136 139  K 3.5 3.6 4.0  CL 95* 94* 94*  CO2 34* 30 32  GLUCOSE 102* 190* 242*  BUN 5* 14 21  CREATININE 1.95* 3.17* 4.01*  CALCIUM 9.0 9.1 8.8*  PHOS  --  3.6 4.1   Liver Function Tests: Recent Labs  Lab 09/25/23 0206 09/26/23 0605 09/27/23 0407  AST 30  --   --   ALT 20  --   --   ALKPHOS 153*  --   --   BILITOT 0.4  --   --   PROT 6.6  --   --   ALBUMIN 3.2* 2.9* 2.8*   Recent Labs  Lab 09/25/23 0206  LIPASE 60*   CBC: Recent Labs  Lab 09/25/23 0206 09/25/23 1239 09/26/23 0605 09/27/23 0406  WBC 8.1  --  8.2 6.7  NEUTROABS 3.9  --   --   --   HGB 9.6* 10.1* 9.0* 8.2*  HCT 31.4* 32.6* 28.2* 26.1*  MCV 99.1  --  96.9 97.4  PLT 143*  --  142* 130*   Blood Culture    Component Value Date/Time   SDES BLOOD BLOOD LEFT ARM 06/16/2023 2244   SPECREQUEST  06/16/2023 2244    BOTTLES DRAWN AEROBIC AND ANAEROBIC Blood Culture adequate volume    CULT  06/16/2023 2244    NO GROWTH 5 DAYS Performed at [facility] [place] [facility], [address], [place], [place] [address]    REPTSTATUS 06/21/2023 FINAL 06/16/2023 2244    Cardiac Enzymes: No results for input(s): "CKTOTAL", "CKMB", "CKMBINDEX", "TROPONINI" in the last 168 hours. CBG: Recent Labs  Lab 09/26/23 1201 09/26/23 1635 09/27/23 0035 09/27/23 0431 09/27/23 0756  GLUCAP 236* 223* 221* 257* 238*   Iron Studies: No results for input(s): "IRON", "TIBC", "TRANSFERRIN", "FERRITIN" in the last 72 hours. Lab Results  Component Value Date   INR 2.1 (H) 03/31/2023   INR 1.5 (H) 12/13/2022   Studies/Results: No results found.  Medications:  cefTRIAXone (ROCEPHIN)  IV 2 g (09/26/23 2301)   feeding supplement (OSMOLITE 1.2 CAL) 1,000 mL (09/26/23 1732)   metronidazole 500 mg (09/26/23 2349)    apixaban  5 mg Per Tube BID   carvedilol  3.125 mg Per Tube BID WC   Chlorhexidine Gluconate Cloth  6 each  Topical Daily   heparin sodium (porcine)       heparin sodium (porcine)  4,000 Units Intravenous Once   insulin aspart  0-9 Units Subcutaneous Q6H   insulin glargine-yfgn  15 Units Subcutaneous QHS   multivitamin  1 tablet Oral QHS   senna-docusate  1 tablet Oral BID   sodium chloride flush  10-40 mL Intracatheter Q12H   sodium chloride flush  3 mL Intravenous Q12H    Dialysis Orders: [facility] MWF 3:45 hrs 180NRe 350/600 58.4 2.0 K/ 2.0 Ca TDC - Heparin 2500 units IV initial bolus Heparin 1500 units IV mid run - Mircera 30 mcg IV q 2 weeks (last dose 09/20/2023)  Assessment/Plan: Peg Tube Dysfunction: Resolved with assistance from IR. Per primary Stercoral Colitis/Fecal impaction-per primary ESRD - On HD and using 3.0 K bath.  Hypertension/volume  - BP controlled. Volume acceptable. UF as tolerated.  Anemia  - HGB 9.0 Recent ESA dose. Finished Fe load at OP clinic 09/22/2023  Metabolic bone disease -  PO4 at goal. No VDRA. No binders  Nutrition - Albumin low. Add protein  supplements. On TF.   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  DMT2-per primary  Salome Holmes, NP [facility] 09/27/2023,9:36 AM  LOS: 1 day

## 2023-09-27 NOTE — Progress Notes (Signed)
Noted order for discharge today. RN called patient's daughter, who stated she will be able to receive patient at home after 5pm and will need transportation to home. RN reached out to SW and CM for transportation needs.

## 2023-09-27 NOTE — Progress Notes (Signed)
   09/27/23 1246  Vitals  Temp 98.7 F (37.1 C)  Temp Source Oral  BP (!) 109/57  MAP (mmHg) 72  BP Location Left Arm  BP Method Automatic  Patient Position (if appropriate) Lying  Pulse Rate 88  Pulse Rate Source Monitor  ECG Heart Rate 89  Resp (!) 25  Oxygen Therapy  SpO2 100 %  During Treatment Monitoring  Blood Flow Rate (mL/min) 0 mL/min  Arterial Pressure (mmHg) 23.23 mmHg  Venous Pressure (mmHg) 7.27 mmHg  TMP (mmHg) 9.9 mmHg  Ultrafiltration Rate (mL/min) 363 mL/min  Dialysate Flow Rate (mL/min) 299 ml/min  Duration of HD Treatment -hour(s) 3.75 hour(s)  Cumulative Fluid Removed (mL) per Treatment  1000.08  Post Treatment  Dialyzer Clearance Lightly streaked  Hemodialysis Intake (mL) 0 mL  Liters Processed 90  Fluid Removed (mL) 1000 mL  Tolerated HD Treatment Yes  Post-Hemodialysis Comments remained alert no s/s of distress no c/o  Note  Patient Observations alert no c/o  Hemodialysis Catheter Right Internal jugular  No placement date or time found.   Placed prior to admission: Yes  Orientation: Right  Access Location: Internal jugular  Site Condition No complications  Blue Lumen Status Blood return noted;Flushed;Heparin locked  Red Lumen Status Blood return noted;Flushed;Heparin locked  Purple Lumen Status N/A  Catheter fill solution Heparin 1000 units/ml  Catheter fill volume (Arterial) 2.3 cc  Catheter fill volume (Venous) 2.2  Dressing Type Transparent  Dressing Status Antimicrobial disc/dressing in place;Clean, Dry, Intact  Drainage Description None   Received patient in bed to unit.  Alert and oriented.  Informed consent signed and in chart.   TX duration:3:45  Patient tolerated well.  Transported back to the room  Alert, without acute distress.  Hand-off given to patient's nurse.   Access used: RT HD cath Access issues: none  Total UF removed: 1000 Medication(s) given:none Post HD VS: see above Post HD weight: n/a   [facility]

## 2023-09-27 NOTE — Inpatient Diabetes Management (Signed)
Inpatient Diabetes Program Recommendations  AACE/ADA: New Consensus Statement on Inpatient Glycemic Control (2015)  Target Ranges:  Prepandial:   less than 140 mg/dL      Peak postprandial:   less than 180 mg/dL (1-2 hours)      Critically ill patients:  140 - 180 mg/dL   Lab Results  Component Value Date   GLUCAP 238 (H) 09/27/2023   HGBA1C 6.6 (H) 09/25/2023    Review of Glycemic Control  Latest Reference Range & Units 09/26/23 12:01 09/26/23 16:35 09/27/23 00:35 09/27/23 04:31 09/27/23 07:56  Glucose-Capillary 70 - 99 mg/dL 540 (H) 981 (H) 191 (H) 257 (H) 238 (H)  (H): Data is abnormally high Diabetes history: Type 2 DM Outpatient Diabetes medications: Lantus 15 units every day, Novolog 0-9 units TID Current orders for Inpatient glycemic control: Novolog 0-9 units Q6H, Semglee 15 units QHS Osmolite @ 50 ml/hr Inpatient Diabetes Program Recommendations:    Consider: -Changing correction to Novolog 0-6 units Q4H -Novolog 3 units Q4H for tube feed coverage (to be stopped or held in the event tube feeds stopped)  Thanks, Lujean Rave, MSN, RNC-OB Diabetes Coordinator [phone] (8a-5p)

## 2023-09-27 NOTE — TOC Transition Note (Signed)
Transition of Care [facility]) - Discharge Note   Patient Details  Name: Samantha [name] MRN: [id number] Date of Birth: 17-Jul-1946  Transition of Care [facility]) CM/SW Contact:  Tom-Johnson, Hershal Coria, RN Phone Number: 09/27/2023, 4:29 PM   Clinical Narrative:     Patient is scheduled for discharge today.  Readmission Risk Assessment done. Outpatient f/u, hospital f/u and discharge instructions on AVS. PTAR scheduled for transportation at discharge. No further TOC needs noted.          Final next level of care: Home/Self Care Barriers to Discharge: Barriers Resolved   Patient Goals and CMS Choice Patient states their goals for this hospitalization and ongoing recovery are:: To return home   Choice offered to / list presented to : Patient, Adult Children (Daughter, Joycelyn)      Discharge Placement                Patient to be transferred to facility by: PTAR      Discharge Plan and Services Additional resources added to the After Visit Summary for                  DME Arranged: N/A DME Agency: NA       HH Arranged: NA HH Agency: NA        Social Drivers of Health (SDOH) Interventions SDOH Screenings   Food Insecurity: No Food Insecurity (09/25/2023)  Housing: Low Risk  (09/25/2023)  Transportation Needs: No Transportation Needs (09/25/2023)  Utilities: Not At Risk (09/25/2023)  Depression (PHQ2-9): Low Risk  (06/24/2023)  Financial Resource Strain: Low Risk  (06/20/2023)  Physical Activity: Unknown (06/20/2023)  Social Connections: Socially Isolated (09/25/2023)  Stress: No Stress Concern Present (06/20/2023)  Tobacco Use: Unknown (09/24/2023)  Health Literacy: Low Risk  (01/23/2020)   Received from [facility]     Readmission Risk Interventions    04/01/2023    3:49 PM  Readmission Risk Prevention Plan  Transportation Screening Complete  Medication Review (RN Care Manager) Referral to Pharmacy  PCP or Specialist appointment within 3-5  days of discharge Complete  HRI or Home Care Consult Complete  SW Recovery Care/Counseling Consult Complete  Palliative Care Screening Not Applicable  Skilled Nursing Facility Not Applicable

## 2023-09-27 NOTE — Progress Notes (Signed)
D/C order noted. Contacted [facility] to be advised of pt's d/c today and that pt should resume care on Wednesday.   Olivia Canter Renal Navigator [phone]

## 2023-09-27 NOTE — Plan of Care (Signed)

## 2023-09-28 ENCOUNTER — Ambulatory Visit: Payer: 59 | Admitting: Sports Medicine

## 2023-09-28 NOTE — TOC Transition Note (Signed)
Transition of Care - Initial Contact from Inpatient Facility  Date of discharge: 09/27/23 Date of contact: 09/28/23 Method: Attempted Phone Call Spoke to: No Answer  Patient's daughter contacted to discuss transition of care from recent inpatient hospitalization but she didn't pick up the phone. A voicemail was left to call the [facility] HD unit [phone] for any questions or concerns.  Patient will return to her outpatient HD center on 09/29/23   Salome Holmes, NP

## 2023-09-28 NOTE — Telephone Encounter (Signed)
 Samantha [name], [place]     09/28/23  2:07 PM Note FMLA Paperwork from January placed at front for patient to pick up, patient aware. I also shared Sherlynn Madden, MD response via mychart.    Side Note- paperwork was left in CI area by Sherlynn Madden, MD

## 2023-09-28 NOTE — Discharge Planning (Signed)
[facility] Patient Discharge Orders- [facility] CLINIC: [facility]  Patient's name: Cymone Sillas Admit/DC Dates: 09/24/2023 - 09/27/2023  Discharge Diagnoses: PEG tube malfunction - IR consulted and resolved after adapter placement  ABD CT (+) stercoral colitis - on PO Cipro and Flagyl X 3 days  Aranesp: Given: No     Last Hgb: 8.2 PRBC's Given: No  ESA dose for discharge: Raise mircera to 75 mcg IV q 2 weeks starting 10/04/23 IV Iron dose at discharge: Fe load completed on 09/22/23  Heparin change: No  EDW Change: No. Weights under original EDW here but not convinced these are accurate. Leave EDW for now and monitor her volume closely.  [place] Change: No  Access intervention/Change: No   Hectorol/Calcitriol change: N/A  Discharge Labs: Calcium 8.8 Phosphorus 4.1 Albumin 2.8 K+ 4.0  IV Antibiotics: Yes Stercoral colitis: On PO Cipro and Flagyl X 3 days   On Coumadin?: No. On Eliquis   OTHER/APPTS/LAB ORDERS:    D/C Meds to be reconciled by nurse after every discharge.  Completed By: Salome Holmes, NP   Reviewed by: MD:______ RN_______

## 2023-09-28 NOTE — Telephone Encounter (Signed)
 Samantha Madden, MD  Anderson Lyle POUR, CMA; [facility]9 minutes ago (12:32 PM)    Would not able to change the duration to 6 months and incapacitated time for the daughter to be off the work to 3  times/ week. As patient has dementia, its difficult to state exactly amount of days pt will be incapacitated , some weeks she may be good , some days she might need help. Will not be able to change.

## 2023-09-28 NOTE — Telephone Encounter (Signed)
FMLA Paperwork from January placed at front for patient to pick up, patient aware. I also shared [name], MD response via mychart.   Side Note- paperwork was left in CI area by [name], MD

## 2023-10-05 ENCOUNTER — Ambulatory Visit (HOSPITAL_COMMUNITY)
Admission: RE | Admit: 2023-10-05 | Discharge: 2023-10-05 | Disposition: A | Discharge status: Home / Self Care | Payer: BLUE CROSS/BLUE SHIELD | Source: Ambulatory Visit | Attending: Interventional Radiology | Admitting: Interventional Radiology

## 2023-10-05 ENCOUNTER — Other Ambulatory Visit (HOSPITAL_COMMUNITY): Payer: Self-pay | Admitting: Interventional Radiology

## 2023-10-05 DIAGNOSIS — T85848A Pain due to other internal prosthetic devices, implants and grafts, initial encounter: Secondary | ICD-10-CM | POA: Insufficient documentation

## 2023-10-05 DIAGNOSIS — R633 Feeding difficulties, unspecified: Secondary | ICD-10-CM | POA: Insufficient documentation

## 2023-10-05 DIAGNOSIS — Z431 Encounter for attention to gastrostomy: Secondary | ICD-10-CM | POA: Diagnosis not present

## 2023-10-05 DIAGNOSIS — N186 End stage renal disease: Secondary | ICD-10-CM | POA: Diagnosis present

## 2023-10-05 HISTORY — PX: IR REPLC GASTRO/COLONIC TUBE PERCUT W/FLUORO: IMG2333

## 2023-10-05 HISTORY — PX: IR EXCHANGE BILIARY DRAIN: IMG6046

## 2023-10-05 MED ORDER — LIDOCAINE HCL 1 % IJ SOLN
INTRAMUSCULAR | Status: AC
  Filled 2023-10-05: qty 20

## 2023-10-05 MED ORDER — IOHEXOL 300 MG/ML  SOLN
50.0000 mL | Freq: Once | INTRAMUSCULAR | Status: AC | PRN
  Administered 2023-10-05: 5 mL

## 2023-10-05 MED ORDER — LIDOCAINE VISCOUS HCL 2 % MT SOLN
OROMUCOSAL | Status: AC
  Filled 2023-10-05: qty 15

## 2023-10-12 ENCOUNTER — Ambulatory Visit: Payer: 59 | Admitting: Sports Medicine

## 2023-10-15 LAB — LAB REPORT - SCANNED: A1c: 6.5

## 2023-10-19 ENCOUNTER — Encounter: Payer: Self-pay | Admitting: Sports Medicine

## 2023-10-19 ENCOUNTER — Ambulatory Visit (INDEPENDENT_AMBULATORY_CARE_PROVIDER_SITE_OTHER): Payer: 59 | Admitting: Sports Medicine

## 2023-10-19 ENCOUNTER — Ambulatory Visit: Payer: 59 | Admitting: Sports Medicine

## 2023-10-19 ENCOUNTER — Telehealth: Payer: Self-pay | Admitting: Sports Medicine

## 2023-10-19 VITALS — BP 128/70 | HR 85 | Temp 96.9°F | Resp 17 | Ht 59.0 in | Wt 132.0 lb

## 2023-10-19 DIAGNOSIS — F0280 Dementia in other diseases classified elsewhere without behavioral disturbance: Secondary | ICD-10-CM

## 2023-10-19 DIAGNOSIS — E1122 Type 2 diabetes mellitus with diabetic chronic kidney disease: Secondary | ICD-10-CM

## 2023-10-19 DIAGNOSIS — Z794 Long term (current) use of insulin: Secondary | ICD-10-CM

## 2023-10-19 DIAGNOSIS — G309 Alzheimer's disease, unspecified: Secondary | ICD-10-CM

## 2023-10-19 DIAGNOSIS — N186 End stage renal disease: Secondary | ICD-10-CM | POA: Diagnosis not present

## 2023-10-19 DIAGNOSIS — R1032 Left lower quadrant pain: Secondary | ICD-10-CM

## 2023-10-19 DIAGNOSIS — Z931 Gastrostomy status: Secondary | ICD-10-CM | POA: Diagnosis not present

## 2023-10-19 DIAGNOSIS — I6990 Unspecified sequelae of unspecified cerebrovascular disease: Secondary | ICD-10-CM

## 2023-10-19 DIAGNOSIS — Z992 Dependence on renal dialysis: Secondary | ICD-10-CM

## 2023-10-19 MED ORDER — ATORVASTATIN CALCIUM 10 MG PO TABS: 10.0000 mg | ORAL_TABLET | Freq: Every day | ORAL | 1 refills | Status: DC

## 2023-10-19 NOTE — Progress Notes (Signed)
 Careteam: Patient Care Team: [name], MD as PCP - General (Internal Medicine)  PLACE OF SERVICE:  [facility] CLINIC  Advanced Directive information Does Patient Have a Medical Advance Directive?: No, Would patient like information on creating a medical advance directive?: No - Patient declined  Allergies  Allergen Reactions   Asa [Aspirin] Anaphylaxis   Penicillins Anaphylaxis    **Tolerates cephalosporins    Chief Complaint  Patient presents with   Hospitalization Follow-up    Hospital follow up 09/24/2023-09/27/2023.    Referral    Patient daughter is requesting referral to Nephrology and needing Colonoscopy.      Discussed the use of AI scribe software for clinical note transcription with the patient, who gave verbal consent to proceed.  History of Present Illness   Samantha [name] is a 78 year old female with a history of DM, ESRD on dialysis, CVA presents for hospital follow up visit.  She is accompanied by her aide. Pt was recently hospitalized for clogged PEG tube .  As per discharge summary Brief Summary/ Hospital Course: ''  This 78 yrs old female with medical history significant of hypertension, CVA with residual left-sided hemiparesis, dysphagia  s/p PEG, ESRD on HD, diabetes mellitus type 2, hypothyroidism, and severe dementia who presents due to PEG tube being clogged. She reported having abdominal pain localized to the area around her PEG tube. She is on a regular diet and receives tube feeds at night. She missed her tube feeds yesterday due to being clogged. Daughter reports her having constipation and bloating that she has been giving her Senokot. She was afebrile, mildly tachypneic in the ED,  other vitals were stable.  CT A&P showed showed fecal impaction and stercoral colitis. Patient was admitted and started on empiric antibiotics.  IR consulted,  PEG tube was exchanged.  Tube feeds resumed.  Patient has been feeling better, Patient had liquid bowel  movement after having smog enema.  Patient was seen in the dialysis,  tolerating well.  Reports feeling better and wants to be discharged.  Patient is being discharged home after hemodialysis today.''  She has been experiencing abdominal pain localized around her PEG tube site, persisting since before her recent hospitalization in January when her PEG tube was replaced due to clogging. The new tube is functioning well, with no associated nausea, vomiting, or redness around the site.  Aid reports that she is able to tolerate PO intake with no vomiting, nausea.  She is currently on dialysis, attending sessions on Monday, Wednesday, and Friday at [facility].  Pt denies chest pain, SOB, dizzy or lightheadedness.  Her diabetes is managed with insulin, administered by her daughter, who also manages her blood sugar checks.   Her last bowel movement yesterday, and she reports regular bowel movements without constipation.  She is unable to transfer from bed to chair independently and requires assistance with bathing and dressing.  Aid reports that  she experiences occasional confusion, particularly regarding her ability to get up and use the bathroom.         Review of Systems:  Review of Systems  Constitutional:  Negative for fever.  HENT:  Negative for congestion.   Respiratory:  Negative for cough, sputum production and shortness of breath.   Cardiovascular:  Negative for chest pain, palpitations and leg swelling.  Gastrointestinal:  Positive for abdominal pain. Negative for heartburn and nausea.  Genitourinary:  Negative for dysuria, frequency and hematuria.  Musculoskeletal:  Negative for joint pain.  Neurological:  Negative for dizziness.   Negative unless indicated in HPI.   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   Dementia (HCC)    Diabetes (HCC)    Diabetes mellitus without complication (HCC)    ESRD  (end stage renal disease) on dialysis (HCC)    Heart failure (HCC)    History of CT scan    History of heart attack    History of MRI    History of stroke    HTN (hypertension)    Kidney failure    PEG (percutaneous endoscopic gastrostomy) status (HCC)    Thyroid disease    Past Surgical History:  Procedure Laterality Date   ABDOMINAL HYSTERECTOMY     GALLBLADDER SURGERY     IR EXCHANGE BILIARY DRAIN  02/11/2023   IR EXCHANGE BILIARY DRAIN  04/13/2023   IR EXCHANGE BILIARY DRAIN  06/15/2023   IR EXCHANGE BILIARY DRAIN  08/23/2023   IR EXCHANGE BILIARY DRAIN  10/05/2023   IR PERC CHOLECYSTOSTOMY  12/16/2022   IR REPLACE G-TUBE SIMPLE WO FLUORO  08/23/2023   IR REPLC GASTRO/COLONIC TUBE PERCUT W/FLUORO  06/15/2023   IR REPLC GASTRO/COLONIC TUBE PERCUT W/FLUORO  10/05/2023   Social History:   reports that she has never smoked. She does not have any smokeless tobacco history on file. She reports that she does not currently use alcohol. She reports that she does not use drugs.  Family History  Problem Relation Age of Onset   Diabetes Mother     Medications: Patient's Medications  New Prescriptions   ATORVASTATIN (LIPITOR) 10 MG TABLET    Take 1 tablet (10 mg total) by mouth daily.  Previous Medications   ACETAMINOPHEN (TYLENOL) 500 MG TABLET    Place 500 mg into feeding tube every 6 (six) hours as needed for mild pain.   APIXABAN (ELIQUIS) 5 MG TABS TABLET    Place 1 tablet (5 mg total) into feeding tube 2 (two) times daily.   CARVEDILOL (COREG) 3.125 MG TABLET    Take 3.125 mg Q AM  and 3.125 mg Q PM on non dialysis days (Tues, Thurs, Sat and Sun,   - On dialysis days,  MWF, continue Coreg 3.125 mg Q PM -  Hold for SBP < 100   GLUCAGON (BAQSIMI ONE PACK) 3 MG/DOSE POWD    Place 1 packet into the nose as needed (For blood sugar). Use when blood sugar drops below 70.   INSULIN ASPART (NOVOLOG) 100 UNIT/ML INJECTION    0-9 Units, Subcutaneous, 3 times daily with meals, CBG < 70:  Implement Hypoglycemia measures CBG 70 - 120: 0 units CBG 121 - 150: 1 unit CBG 151 - 200: 2 units CBG 201 - 250: 3 units CBG 251 - 300: 5 units CBG 301 - 350: 7 units CBG 351 - 400: 9 units CBG > 400: call MD   INSULIN GLARGINE (LANTUS) 100 UNIT/ML SOLOSTAR PEN    Inject 20 Units into the skin at bedtime.   MULTIVITAMIN (RENA-VIT) TABS TABLET    Take 1 tablet by mouth at bedtime.   NUTRITIONAL SUPPLEMENTS (FEEDING SUPPLEMENT, OSMOLITE 1.5 CAL,) LIQD    Place 1,000 mLs into feeding tube See admin instructions. 8p-6a   PROMETHAZINE (PHENERGAN) 25 MG TABLET    Take 0.5 tablets (12.5 mg total) by mouth every 6 (six) hours as needed for nausea or vomiting.   SENNA (SENOKOT) 8.6 MG TABS TABLET  Take 1 tablet (8.6 mg total) by mouth daily.  Modified Medications   No medications on file  Discontinued Medications   No medications on file    Physical Exam: Vitals:   10/19/23 0949  BP: 128/70  Pulse: 85  Resp: 17  Temp: (!) 96.9 F (36.1 C)  SpO2: 98%  Weight: 132 lb (59.9 kg)  Height: 4\' 11"  (1.499 m)   Body mass index is 26.66 kg/m. BP Readings from Last 3 Encounters:  10/19/23 128/70  09/27/23 103/61  09/09/23 (!) 92/55   Wt Readings from Last 3 Encounters:  10/19/23 132 lb (59.9 kg)  09/26/23 124 lb 5.4 oz (56.4 kg)  09/01/23 118 lb (53.5 kg)    Physical Exam Constitutional:      Appearance: Normal appearance.  HENT:     Head: Normocephalic and atraumatic.  Cardiovascular:     Rate and Rhythm: Normal rate and regular rhythm.  Pulmonary:     Effort: Pulmonary effort is normal. No respiratory distress.     Breath sounds: Normal breath sounds. No wheezing.  Abdominal:     General: Bowel sounds are normal. There is no distension.     Tenderness: There is no abdominal tenderness. There is no guarding or rebound.     Comments:  Peg tube in place No redness or drainage  Tender to palpation   Musculoskeletal:        General: No swelling or tenderness.  Neurological:      Mental Status: She is alert. Mental status is at baseline.     Motor: No weakness.     Labs reviewed: Basic Metabolic Panel: Recent Labs    03/25/23 1604 03/31/23 0050 04/06/23 0311 04/07/23 0610 05/26/23 0000 06/16/23 2226 09/01/23 1954 09/25/23 0206 09/26/23 0605 09/27/23 0406 09/27/23 0407  NA 138   < > 134* 138   < > 137   < > 138 136  --  139  K 4.0   < > 3.3* 3.2*   < > 3.3*   < > 3.5 3.6  --  4.0  CL 95*   < > 99 101   < > 94*   < > 95* 94*  --  94*  CO2 35*   < > 29 24  --  31   < > 34* 30  --  32  GLUCOSE 97   < > 163* 132*  --  161*   < > 102* 190*  --  242*  BUN 14   < > 14 25*   < > 18   < > 5* 14  --  21  CREATININE 2.78*   < > 1.79* 2.48*   < > 2.29*   < > 1.95* 3.17*  --  4.01*  CALCIUM 9.5   < > 8.4* 8.2*   < > 8.7*   < > 9.0 9.1  --  8.8*  MG  --    < > 1.6*  --   --  2.3  --   --   --  2.1  --   PHOS  --    < > 1.9* 3.4  --   --   --   --  3.6  --  4.1  TSH 2.78  --   --   --   --  3.036  --   --   --   --   --    < > = values in this interval not displayed.   Liver  Function Tests: Recent Labs    06/16/23 2226 09/01/23 1954 09/25/23 0206 09/26/23 0605 09/27/23 0407  AST 33 38 30  --   --   ALT 33 20 20  --   --   ALKPHOS 130* 149* 153*  --   --   BILITOT 0.6 0.7 0.4  --   --   PROT 7.3 7.6 6.6  --   --   ALBUMIN 3.7 3.7 3.2* 2.9* 2.8*   Recent Labs    06/16/23 2226 09/01/23 1954 09/25/23 0206  LIPASE 60* 55* 60*   No results for input(s): "AMMONIA" in the last 8760 hours. CBC: Recent Labs    06/16/23 2226 09/01/23 1954 09/25/23 0206 09/25/23 1239 09/26/23 0605 09/27/23 0406  WBC 9.4 9.7 8.1  --  8.2 6.7  NEUTROABS 4.4 7.1 3.9  --   --   --   HGB 11.2* 11.9* 9.6* 10.1* 9.0* 8.2*  HCT 34.3* 38.1 31.4* 32.6* 28.2* 26.1*  MCV 95.0 100.3* 99.1  --  96.9 97.4  PLT 174 193 143*  --  142* 130*   Lipid Panel: Recent Labs    03/25/23 1604  CHOL 142  HDL 40.40  LDLCALC 70  TRIG 158.0*  CHOLHDL 4   TSH: Recent Labs     03/25/23 1604 06/16/23 2226  TSH 2.78 3.036   A1C: Lab Results  Component Value Date   HGBA1C 6.6 (H) 09/25/2023    Assessment and Plan    PEG Tube LLQ chronic abdominal pain Replaced due to clogging in January.  Functioning well as per aid  No signs of infection or drainage observed during examination. However, patient reports chronic abdominal pain. -Order abdominal X-ray   -Refer to Gastroenterologist for further evaluation of chronic abdominal pain.    ESRD Patient undergoing dialysis on Monday, Wednesday, and Friday at [facility].  -Order CBC and BMP       Diabetes Mellitus Patient on 20 units of insulin at night. Recent HbA1c 6.6, indicating good glycemic control. -Continue current insulin regimen. -Advise caregiver to monitor blood glucose levels regularly. Instructed to bring BG log at next visit Will start lipitor  Hypertension Blood pressure well-controlled at current visit (128/70). -Continue current antihypertensive regimen.  H/o CVA  Will start lipitor  Daughter is requesting Home health for PT/OT orders   30 minTotal time spent for obtaining history,  performing a medically appropriate examination and evaluation, reviewing the tests   documenting clinical information in the electronic or other health record,  ,care coordination (not separately reported)

## 2023-10-19 NOTE — Telephone Encounter (Signed)
 Patient daughter called in to inform Dr V that she would like orders for PT and OT say that if there are any questions please call her. Thank you

## 2023-10-20 LAB — CBC
HCT: 34.2 % — ABNORMAL LOW (ref 35.0–45.0)
Hemoglobin: 10.8 g/dL — ABNORMAL LOW (ref 11.7–15.5)
MCH: 29.3 pg (ref 27.0–33.0)
MCHC: 31.6 g/dL — ABNORMAL LOW (ref 32.0–36.0)
MCV: 92.9 fL (ref 80.0–100.0)
MPV: 11.7 fL (ref 7.5–12.5)
Platelets: 165 10*3/uL (ref 140–400)
RBC: 3.68 10*6/uL — ABNORMAL LOW (ref 3.80–5.10)
RDW: 15.8 % — ABNORMAL HIGH (ref 11.0–15.0)
WBC: 7.2 10*3/uL (ref 3.8–10.8)

## 2023-10-20 LAB — BASIC METABOLIC PANEL WITH GFR
BUN/Creatinine Ratio: 6 (calc) (ref 6–22)
BUN: 16 mg/dL (ref 7–25)
CO2: 32 mmol/L (ref 20–32)
Calcium: 9.7 mg/dL (ref 8.6–10.4)
Chloride: 93 mmol/L — ABNORMAL LOW (ref 98–110)
Creat: 2.54 mg/dL — ABNORMAL HIGH (ref 0.60–1.00)
Glucose, Bld: 168 mg/dL — ABNORMAL HIGH (ref 65–99)
Potassium: 4.5 mmol/L (ref 3.5–5.3)
Sodium: 136 mmol/L (ref 135–146)
eGFR: 19 mL/min/{1.73_m2} — ABNORMAL LOW (ref >=60)

## 2023-10-20 NOTE — Telephone Encounter (Signed)
 Message left on clinical intake voicemail:   Samantha [name] called to check the status of referral. I returned call to [facility] and questioned if she is requesting Home Health or out patient therapy. Samantha [name] would like for her mother to receive home health therapy.

## 2023-10-20 NOTE — Telephone Encounter (Addendum)
 [name], MD  [facility]18 hours ago (3:46 PM)    I am fine    Outgoing call placed to patients daughter to question is she would like an order for inpatient or outpatient therapy. Left message on voicemail for patient to return call when available

## 2023-10-20 NOTE — Addendum Note (Signed)
 Addended by: [name] on: 10/20/2023 03:32 PM   Modules accepted: Orders

## 2023-10-23 DIAGNOSIS — N186 End stage renal disease: Secondary | ICD-10-CM | POA: Diagnosis not present

## 2023-10-23 DIAGNOSIS — Z431 Encounter for attention to gastrostomy: Secondary | ICD-10-CM | POA: Diagnosis not present

## 2023-10-23 DIAGNOSIS — E46 Unspecified protein-calorie malnutrition: Secondary | ICD-10-CM | POA: Diagnosis not present

## 2023-10-23 DIAGNOSIS — M6281 Muscle weakness (generalized): Secondary | ICD-10-CM | POA: Diagnosis not present

## 2023-10-23 DIAGNOSIS — Z9049 Acquired absence of other specified parts of digestive tract: Secondary | ICD-10-CM | POA: Diagnosis not present

## 2023-10-25 DIAGNOSIS — N2581 Secondary hyperparathyroidism of renal origin: Secondary | ICD-10-CM | POA: Diagnosis not present

## 2023-10-25 DIAGNOSIS — N186 End stage renal disease: Secondary | ICD-10-CM | POA: Diagnosis not present

## 2023-10-25 DIAGNOSIS — E7849 Other hyperlipidemia: Secondary | ICD-10-CM | POA: Diagnosis not present

## 2023-10-25 DIAGNOSIS — Z931 Gastrostomy status: Secondary | ICD-10-CM | POA: Diagnosis not present

## 2023-10-25 DIAGNOSIS — D509 Iron deficiency anemia, unspecified: Secondary | ICD-10-CM | POA: Diagnosis not present

## 2023-10-25 DIAGNOSIS — D689 Coagulation defect, unspecified: Secondary | ICD-10-CM | POA: Diagnosis not present

## 2023-10-25 DIAGNOSIS — D631 Anemia in chronic kidney disease: Secondary | ICD-10-CM | POA: Diagnosis not present

## 2023-10-25 DIAGNOSIS — R1312 Dysphagia, oropharyngeal phase: Secondary | ICD-10-CM | POA: Diagnosis not present

## 2023-10-25 DIAGNOSIS — I222 Subsequent non-ST elevation (NSTEMI) myocardial infarction: Secondary | ICD-10-CM | POA: Diagnosis not present

## 2023-10-25 DIAGNOSIS — Z992 Dependence on renal dialysis: Secondary | ICD-10-CM | POA: Diagnosis not present

## 2023-10-26 ENCOUNTER — Telehealth: Payer: Self-pay

## 2023-10-26 DIAGNOSIS — E1140 Type 2 diabetes mellitus with diabetic neuropathy, unspecified: Secondary | ICD-10-CM | POA: Diagnosis not present

## 2023-10-26 NOTE — Telephone Encounter (Signed)
 Sent [employer] card to Clifton Custard at [place] to see if they can still see this patient with new insurance. Awaiting his reply.

## 2023-10-26 NOTE — Telephone Encounter (Addendum)
 Spoke with patient's daughter this morning in regards to the referral that was order on 10/20/2023 with [facility].  Patient states that [employer] call her saying that they have not gotten anything from [place] about the order for therapy because her healthcare insurance was change from [employer] to [employer].  Patient daughter stated that she brought her mother insurance to the office that was change by administrator Jaquita Rector and wanted to ask if Myra Rude  resend the referral for therapy to [facility] and not [employer]. Please Advise   Message sent to Myra Rude  and [name], MD

## 2023-10-27 DIAGNOSIS — D631 Anemia in chronic kidney disease: Secondary | ICD-10-CM | POA: Diagnosis not present

## 2023-10-27 DIAGNOSIS — D509 Iron deficiency anemia, unspecified: Secondary | ICD-10-CM | POA: Diagnosis not present

## 2023-10-27 DIAGNOSIS — D689 Coagulation defect, unspecified: Secondary | ICD-10-CM | POA: Diagnosis not present

## 2023-10-27 DIAGNOSIS — N2581 Secondary hyperparathyroidism of renal origin: Secondary | ICD-10-CM | POA: Diagnosis not present

## 2023-10-27 DIAGNOSIS — Z992 Dependence on renal dialysis: Secondary | ICD-10-CM | POA: Diagnosis not present

## 2023-10-27 DIAGNOSIS — N186 End stage renal disease: Secondary | ICD-10-CM | POA: Diagnosis not present

## 2023-10-27 NOTE — Telephone Encounter (Signed)
 Left message on voicemail for patient to return call when available

## 2023-10-27 NOTE — Telephone Encounter (Signed)
 Spoke with patient daughter this morning to let know that [facility] is going out to the patient home on Thursday to start her therapy.  Patient daughter want to the [name], MD and Lauralee Evener D  for taking the time for taking of her mother.  Message sent to [name], MD and Myra Rude

## 2023-10-28 ENCOUNTER — Telehealth: Payer: Self-pay

## 2023-10-28 DIAGNOSIS — E119 Type 2 diabetes mellitus without complications: Secondary | ICD-10-CM | POA: Diagnosis not present

## 2023-10-28 DIAGNOSIS — I5020 Unspecified systolic (congestive) heart failure: Secondary | ICD-10-CM | POA: Diagnosis not present

## 2023-10-28 DIAGNOSIS — I69354 Hemiplegia and hemiparesis following cerebral infarction affecting left non-dominant side: Secondary | ICD-10-CM | POA: Diagnosis not present

## 2023-10-28 DIAGNOSIS — I132 Hypertensive heart and chronic kidney disease with heart failure and with stage 5 chronic kidney disease, or end stage renal disease: Secondary | ICD-10-CM | POA: Diagnosis not present

## 2023-10-28 DIAGNOSIS — Z931 Gastrostomy status: Secondary | ICD-10-CM | POA: Diagnosis not present

## 2023-10-28 DIAGNOSIS — E039 Hypothyroidism, unspecified: Secondary | ICD-10-CM | POA: Diagnosis not present

## 2023-10-28 DIAGNOSIS — Z556 Problems related to health literacy: Secondary | ICD-10-CM | POA: Diagnosis not present

## 2023-10-28 DIAGNOSIS — E1122 Type 2 diabetes mellitus with diabetic chronic kidney disease: Secondary | ICD-10-CM | POA: Diagnosis not present

## 2023-10-28 DIAGNOSIS — Z7901 Long term (current) use of anticoagulants: Secondary | ICD-10-CM | POA: Diagnosis not present

## 2023-10-28 DIAGNOSIS — I252 Old myocardial infarction: Secondary | ICD-10-CM | POA: Diagnosis not present

## 2023-10-28 DIAGNOSIS — Z992 Dependence on renal dialysis: Secondary | ICD-10-CM | POA: Diagnosis not present

## 2023-10-28 DIAGNOSIS — N186 End stage renal disease: Secondary | ICD-10-CM | POA: Diagnosis not present

## 2023-10-28 DIAGNOSIS — L97511 Non-pressure chronic ulcer of other part of right foot limited to breakdown of skin: Secondary | ICD-10-CM | POA: Diagnosis not present

## 2023-10-28 DIAGNOSIS — Z794 Long term (current) use of insulin: Secondary | ICD-10-CM | POA: Diagnosis not present

## 2023-10-28 DIAGNOSIS — G309 Alzheimer's disease, unspecified: Secondary | ICD-10-CM | POA: Diagnosis not present

## 2023-10-28 DIAGNOSIS — B351 Tinea unguium: Secondary | ICD-10-CM | POA: Diagnosis not present

## 2023-10-28 DIAGNOSIS — I69391 Dysphagia following cerebral infarction: Secondary | ICD-10-CM | POA: Diagnosis not present

## 2023-10-28 DIAGNOSIS — Z86718 Personal history of other venous thrombosis and embolism: Secondary | ICD-10-CM | POA: Diagnosis not present

## 2023-10-28 DIAGNOSIS — E1142 Type 2 diabetes mellitus with diabetic polyneuropathy: Secondary | ICD-10-CM | POA: Diagnosis not present

## 2023-10-28 DIAGNOSIS — Z8673 Personal history of transient ischemic attack (TIA), and cerebral infarction without residual deficits: Secondary | ICD-10-CM | POA: Diagnosis not present

## 2023-10-28 DIAGNOSIS — K5900 Constipation, unspecified: Secondary | ICD-10-CM | POA: Diagnosis not present

## 2023-10-28 NOTE — Telephone Encounter (Signed)
 Copied from CRM [id number]. Topic: Clinical - Lab/Test Results >> Oct 28, 2023  3:21 PM [name] H wrote: Reason for CRM: Natalia Leatherwood called in on behalf of patient to get verbal orders for home health pt, Katherines callback number is [phone] and can leave voicemail is she does not answer. She is added and the frequency for pt is 1 week 8, OT eval     I called the number back for katherine to give the verbal orders .

## 2023-10-29 ENCOUNTER — Telehealth: Payer: Self-pay | Admitting: Emergency Medicine

## 2023-10-29 DIAGNOSIS — N186 End stage renal disease: Secondary | ICD-10-CM | POA: Diagnosis not present

## 2023-10-29 DIAGNOSIS — Z992 Dependence on renal dialysis: Secondary | ICD-10-CM | POA: Diagnosis not present

## 2023-10-29 DIAGNOSIS — N2581 Secondary hyperparathyroidism of renal origin: Secondary | ICD-10-CM | POA: Diagnosis not present

## 2023-10-29 DIAGNOSIS — D509 Iron deficiency anemia, unspecified: Secondary | ICD-10-CM | POA: Diagnosis not present

## 2023-10-29 DIAGNOSIS — D689 Coagulation defect, unspecified: Secondary | ICD-10-CM | POA: Diagnosis not present

## 2023-10-29 DIAGNOSIS — N184 Chronic kidney disease, stage 4 (severe): Secondary | ICD-10-CM

## 2023-10-29 DIAGNOSIS — D631 Anemia in chronic kidney disease: Secondary | ICD-10-CM | POA: Diagnosis not present

## 2023-10-29 NOTE — Telephone Encounter (Signed)
Discussed with patient, patient verbalized understanding of results  

## 2023-10-29 NOTE — Telephone Encounter (Signed)
Patient needs referral to nephrology

## 2023-11-01 DIAGNOSIS — N186 End stage renal disease: Secondary | ICD-10-CM | POA: Diagnosis not present

## 2023-11-01 DIAGNOSIS — Z992 Dependence on renal dialysis: Secondary | ICD-10-CM | POA: Diagnosis not present

## 2023-11-01 DIAGNOSIS — D509 Iron deficiency anemia, unspecified: Secondary | ICD-10-CM | POA: Diagnosis not present

## 2023-11-01 DIAGNOSIS — D631 Anemia in chronic kidney disease: Secondary | ICD-10-CM | POA: Diagnosis not present

## 2023-11-01 DIAGNOSIS — D689 Coagulation defect, unspecified: Secondary | ICD-10-CM | POA: Diagnosis not present

## 2023-11-01 DIAGNOSIS — N2581 Secondary hyperparathyroidism of renal origin: Secondary | ICD-10-CM | POA: Diagnosis not present

## 2023-11-03 DIAGNOSIS — D689 Coagulation defect, unspecified: Secondary | ICD-10-CM | POA: Diagnosis not present

## 2023-11-03 DIAGNOSIS — D509 Iron deficiency anemia, unspecified: Secondary | ICD-10-CM | POA: Diagnosis not present

## 2023-11-03 DIAGNOSIS — N2581 Secondary hyperparathyroidism of renal origin: Secondary | ICD-10-CM | POA: Diagnosis not present

## 2023-11-03 DIAGNOSIS — N186 End stage renal disease: Secondary | ICD-10-CM | POA: Diagnosis not present

## 2023-11-03 DIAGNOSIS — Z992 Dependence on renal dialysis: Secondary | ICD-10-CM | POA: Diagnosis not present

## 2023-11-03 DIAGNOSIS — D631 Anemia in chronic kidney disease: Secondary | ICD-10-CM | POA: Diagnosis not present

## 2023-11-04 DIAGNOSIS — I5020 Unspecified systolic (congestive) heart failure: Secondary | ICD-10-CM | POA: Diagnosis not present

## 2023-11-04 DIAGNOSIS — Z556 Problems related to health literacy: Secondary | ICD-10-CM | POA: Diagnosis not present

## 2023-11-04 DIAGNOSIS — Z7901 Long term (current) use of anticoagulants: Secondary | ICD-10-CM | POA: Diagnosis not present

## 2023-11-04 DIAGNOSIS — Z992 Dependence on renal dialysis: Secondary | ICD-10-CM | POA: Diagnosis not present

## 2023-11-04 DIAGNOSIS — N186 End stage renal disease: Secondary | ICD-10-CM | POA: Diagnosis not present

## 2023-11-04 DIAGNOSIS — K5900 Constipation, unspecified: Secondary | ICD-10-CM | POA: Diagnosis not present

## 2023-11-04 DIAGNOSIS — G309 Alzheimer's disease, unspecified: Secondary | ICD-10-CM | POA: Diagnosis not present

## 2023-11-04 DIAGNOSIS — E039 Hypothyroidism, unspecified: Secondary | ICD-10-CM | POA: Diagnosis not present

## 2023-11-04 DIAGNOSIS — I132 Hypertensive heart and chronic kidney disease with heart failure and with stage 5 chronic kidney disease, or end stage renal disease: Secondary | ICD-10-CM | POA: Diagnosis not present

## 2023-11-04 DIAGNOSIS — I252 Old myocardial infarction: Secondary | ICD-10-CM | POA: Diagnosis not present

## 2023-11-04 DIAGNOSIS — Z794 Long term (current) use of insulin: Secondary | ICD-10-CM | POA: Diagnosis not present

## 2023-11-04 DIAGNOSIS — Z86718 Personal history of other venous thrombosis and embolism: Secondary | ICD-10-CM | POA: Diagnosis not present

## 2023-11-04 DIAGNOSIS — Z931 Gastrostomy status: Secondary | ICD-10-CM | POA: Diagnosis not present

## 2023-11-04 DIAGNOSIS — I69354 Hemiplegia and hemiparesis following cerebral infarction affecting left non-dominant side: Secondary | ICD-10-CM | POA: Diagnosis not present

## 2023-11-04 DIAGNOSIS — E1122 Type 2 diabetes mellitus with diabetic chronic kidney disease: Secondary | ICD-10-CM | POA: Diagnosis not present

## 2023-11-04 DIAGNOSIS — E1142 Type 2 diabetes mellitus with diabetic polyneuropathy: Secondary | ICD-10-CM | POA: Diagnosis not present

## 2023-11-04 DIAGNOSIS — I69391 Dysphagia following cerebral infarction: Secondary | ICD-10-CM | POA: Diagnosis not present

## 2023-11-05 DIAGNOSIS — D689 Coagulation defect, unspecified: Secondary | ICD-10-CM | POA: Diagnosis not present

## 2023-11-05 DIAGNOSIS — D631 Anemia in chronic kidney disease: Secondary | ICD-10-CM | POA: Diagnosis not present

## 2023-11-05 DIAGNOSIS — N2581 Secondary hyperparathyroidism of renal origin: Secondary | ICD-10-CM | POA: Diagnosis not present

## 2023-11-05 DIAGNOSIS — D509 Iron deficiency anemia, unspecified: Secondary | ICD-10-CM | POA: Diagnosis not present

## 2023-11-05 DIAGNOSIS — Z992 Dependence on renal dialysis: Secondary | ICD-10-CM | POA: Diagnosis not present

## 2023-11-05 DIAGNOSIS — N186 End stage renal disease: Secondary | ICD-10-CM | POA: Diagnosis not present

## 2023-11-08 ENCOUNTER — Other Ambulatory Visit (HOSPITAL_COMMUNITY): Payer: Self-pay | Admitting: Interventional Radiology

## 2023-11-08 DIAGNOSIS — D631 Anemia in chronic kidney disease: Secondary | ICD-10-CM | POA: Diagnosis not present

## 2023-11-08 DIAGNOSIS — N186 End stage renal disease: Secondary | ICD-10-CM | POA: Diagnosis not present

## 2023-11-08 DIAGNOSIS — N2581 Secondary hyperparathyroidism of renal origin: Secondary | ICD-10-CM | POA: Diagnosis not present

## 2023-11-08 DIAGNOSIS — Z992 Dependence on renal dialysis: Secondary | ICD-10-CM | POA: Diagnosis not present

## 2023-11-08 DIAGNOSIS — R633 Feeding difficulties, unspecified: Secondary | ICD-10-CM

## 2023-11-08 DIAGNOSIS — D509 Iron deficiency anemia, unspecified: Secondary | ICD-10-CM | POA: Diagnosis not present

## 2023-11-08 DIAGNOSIS — D689 Coagulation defect, unspecified: Secondary | ICD-10-CM | POA: Diagnosis not present

## 2023-11-10 DIAGNOSIS — N186 End stage renal disease: Secondary | ICD-10-CM | POA: Diagnosis not present

## 2023-11-10 DIAGNOSIS — Z992 Dependence on renal dialysis: Secondary | ICD-10-CM | POA: Diagnosis not present

## 2023-11-10 DIAGNOSIS — N2581 Secondary hyperparathyroidism of renal origin: Secondary | ICD-10-CM | POA: Diagnosis not present

## 2023-11-10 DIAGNOSIS — D631 Anemia in chronic kidney disease: Secondary | ICD-10-CM | POA: Diagnosis not present

## 2023-11-10 DIAGNOSIS — D689 Coagulation defect, unspecified: Secondary | ICD-10-CM | POA: Diagnosis not present

## 2023-11-10 DIAGNOSIS — D509 Iron deficiency anemia, unspecified: Secondary | ICD-10-CM | POA: Diagnosis not present

## 2023-11-11 ENCOUNTER — Telehealth: Payer: Self-pay

## 2023-11-11 DIAGNOSIS — N186 End stage renal disease: Secondary | ICD-10-CM | POA: Diagnosis not present

## 2023-11-11 DIAGNOSIS — I69354 Hemiplegia and hemiparesis following cerebral infarction affecting left non-dominant side: Secondary | ICD-10-CM | POA: Diagnosis not present

## 2023-11-11 DIAGNOSIS — I132 Hypertensive heart and chronic kidney disease with heart failure and with stage 5 chronic kidney disease, or end stage renal disease: Secondary | ICD-10-CM | POA: Diagnosis not present

## 2023-11-11 DIAGNOSIS — E039 Hypothyroidism, unspecified: Secondary | ICD-10-CM | POA: Diagnosis not present

## 2023-11-11 DIAGNOSIS — E1122 Type 2 diabetes mellitus with diabetic chronic kidney disease: Secondary | ICD-10-CM | POA: Diagnosis not present

## 2023-11-11 DIAGNOSIS — Z992 Dependence on renal dialysis: Secondary | ICD-10-CM | POA: Diagnosis not present

## 2023-11-11 DIAGNOSIS — I5020 Unspecified systolic (congestive) heart failure: Secondary | ICD-10-CM | POA: Diagnosis not present

## 2023-11-11 DIAGNOSIS — I252 Old myocardial infarction: Secondary | ICD-10-CM | POA: Diagnosis not present

## 2023-11-11 DIAGNOSIS — Z86718 Personal history of other venous thrombosis and embolism: Secondary | ICD-10-CM | POA: Diagnosis not present

## 2023-11-11 DIAGNOSIS — Z794 Long term (current) use of insulin: Secondary | ICD-10-CM | POA: Diagnosis not present

## 2023-11-11 DIAGNOSIS — G309 Alzheimer's disease, unspecified: Secondary | ICD-10-CM | POA: Diagnosis not present

## 2023-11-11 DIAGNOSIS — E1142 Type 2 diabetes mellitus with diabetic polyneuropathy: Secondary | ICD-10-CM | POA: Diagnosis not present

## 2023-11-11 DIAGNOSIS — K5900 Constipation, unspecified: Secondary | ICD-10-CM | POA: Diagnosis not present

## 2023-11-11 DIAGNOSIS — Z556 Problems related to health literacy: Secondary | ICD-10-CM | POA: Diagnosis not present

## 2023-11-11 DIAGNOSIS — Z931 Gastrostomy status: Secondary | ICD-10-CM | POA: Diagnosis not present

## 2023-11-11 DIAGNOSIS — I69391 Dysphagia following cerebral infarction: Secondary | ICD-10-CM | POA: Diagnosis not present

## 2023-11-11 DIAGNOSIS — Z7901 Long term (current) use of anticoagulants: Secondary | ICD-10-CM | POA: Diagnosis not present

## 2023-11-11 NOTE — Telephone Encounter (Signed)
 Copied from CRM [id number]. Topic: General - Other >> Nov 11, 2023 10:49 AM Prudencio Pair wrote: Reason for CRM: Caryn Bee, with [employer], called and stated he will be faxing over orders for the provider on behalf of this patient. He states to please forward them to the provider. Provided him with the clinic's fax number.  Orders has been placed in [name], MD box to be completed and signed will faxed orders once they are complete. I have called the patient daughter and left voicemail to let her know that we have received the orders that was faxed from [employer].

## 2023-11-12 DIAGNOSIS — D631 Anemia in chronic kidney disease: Secondary | ICD-10-CM | POA: Diagnosis not present

## 2023-11-12 DIAGNOSIS — D509 Iron deficiency anemia, unspecified: Secondary | ICD-10-CM | POA: Diagnosis not present

## 2023-11-12 DIAGNOSIS — N2581 Secondary hyperparathyroidism of renal origin: Secondary | ICD-10-CM | POA: Diagnosis not present

## 2023-11-12 DIAGNOSIS — D689 Coagulation defect, unspecified: Secondary | ICD-10-CM | POA: Diagnosis not present

## 2023-11-12 DIAGNOSIS — Z992 Dependence on renal dialysis: Secondary | ICD-10-CM | POA: Diagnosis not present

## 2023-11-12 DIAGNOSIS — N186 End stage renal disease: Secondary | ICD-10-CM | POA: Diagnosis not present

## 2023-11-13 DIAGNOSIS — M6281 Muscle weakness (generalized): Secondary | ICD-10-CM | POA: Diagnosis not present

## 2023-11-13 DIAGNOSIS — E46 Unspecified protein-calorie malnutrition: Secondary | ICD-10-CM | POA: Diagnosis not present

## 2023-11-13 DIAGNOSIS — Z431 Encounter for attention to gastrostomy: Secondary | ICD-10-CM | POA: Diagnosis not present

## 2023-11-13 DIAGNOSIS — N186 End stage renal disease: Secondary | ICD-10-CM | POA: Diagnosis not present

## 2023-11-13 DIAGNOSIS — Z9049 Acquired absence of other specified parts of digestive tract: Secondary | ICD-10-CM | POA: Diagnosis not present

## 2023-11-15 DIAGNOSIS — Z992 Dependence on renal dialysis: Secondary | ICD-10-CM | POA: Diagnosis not present

## 2023-11-15 DIAGNOSIS — N2581 Secondary hyperparathyroidism of renal origin: Secondary | ICD-10-CM | POA: Diagnosis not present

## 2023-11-15 DIAGNOSIS — D631 Anemia in chronic kidney disease: Secondary | ICD-10-CM | POA: Diagnosis not present

## 2023-11-15 DIAGNOSIS — N186 End stage renal disease: Secondary | ICD-10-CM | POA: Diagnosis not present

## 2023-11-15 DIAGNOSIS — D689 Coagulation defect, unspecified: Secondary | ICD-10-CM | POA: Diagnosis not present

## 2023-11-15 DIAGNOSIS — D509 Iron deficiency anemia, unspecified: Secondary | ICD-10-CM | POA: Diagnosis not present

## 2023-11-16 DIAGNOSIS — E1142 Type 2 diabetes mellitus with diabetic polyneuropathy: Secondary | ICD-10-CM | POA: Diagnosis not present

## 2023-11-16 DIAGNOSIS — Z86718 Personal history of other venous thrombosis and embolism: Secondary | ICD-10-CM | POA: Diagnosis not present

## 2023-11-16 DIAGNOSIS — N186 End stage renal disease: Secondary | ICD-10-CM | POA: Diagnosis not present

## 2023-11-16 DIAGNOSIS — I5020 Unspecified systolic (congestive) heart failure: Secondary | ICD-10-CM | POA: Diagnosis not present

## 2023-11-16 DIAGNOSIS — I69391 Dysphagia following cerebral infarction: Secondary | ICD-10-CM | POA: Diagnosis not present

## 2023-11-16 DIAGNOSIS — F02C0 Dementia in other diseases classified elsewhere, severe, without behavioral disturbance, psychotic disturbance, mood disturbance, and anxiety: Secondary | ICD-10-CM

## 2023-11-16 DIAGNOSIS — I69354 Hemiplegia and hemiparesis following cerebral infarction affecting left non-dominant side: Secondary | ICD-10-CM | POA: Diagnosis not present

## 2023-11-16 DIAGNOSIS — G309 Alzheimer's disease, unspecified: Secondary | ICD-10-CM | POA: Diagnosis not present

## 2023-11-16 DIAGNOSIS — I132 Hypertensive heart and chronic kidney disease with heart failure and with stage 5 chronic kidney disease, or end stage renal disease: Secondary | ICD-10-CM | POA: Diagnosis not present

## 2023-11-16 DIAGNOSIS — I252 Old myocardial infarction: Secondary | ICD-10-CM | POA: Diagnosis not present

## 2023-11-16 DIAGNOSIS — Z794 Long term (current) use of insulin: Secondary | ICD-10-CM | POA: Diagnosis not present

## 2023-11-16 DIAGNOSIS — K5900 Constipation, unspecified: Secondary | ICD-10-CM | POA: Diagnosis not present

## 2023-11-16 DIAGNOSIS — Z831 Family history of other infectious and parasitic diseases: Secondary | ICD-10-CM

## 2023-11-16 DIAGNOSIS — Z556 Problems related to health literacy: Secondary | ICD-10-CM | POA: Diagnosis not present

## 2023-11-16 DIAGNOSIS — R32 Unspecified urinary incontinence: Secondary | ICD-10-CM

## 2023-11-16 DIAGNOSIS — E1122 Type 2 diabetes mellitus with diabetic chronic kidney disease: Secondary | ICD-10-CM | POA: Diagnosis not present

## 2023-11-16 DIAGNOSIS — Z931 Gastrostomy status: Secondary | ICD-10-CM | POA: Diagnosis not present

## 2023-11-16 DIAGNOSIS — Z7901 Long term (current) use of anticoagulants: Secondary | ICD-10-CM | POA: Diagnosis not present

## 2023-11-16 DIAGNOSIS — Z992 Dependence on renal dialysis: Secondary | ICD-10-CM | POA: Diagnosis not present

## 2023-11-16 DIAGNOSIS — E039 Hypothyroidism, unspecified: Secondary | ICD-10-CM | POA: Diagnosis not present

## 2023-11-17 DIAGNOSIS — D509 Iron deficiency anemia, unspecified: Secondary | ICD-10-CM | POA: Diagnosis not present

## 2023-11-17 DIAGNOSIS — Z992 Dependence on renal dialysis: Secondary | ICD-10-CM | POA: Diagnosis not present

## 2023-11-17 DIAGNOSIS — D631 Anemia in chronic kidney disease: Secondary | ICD-10-CM | POA: Diagnosis not present

## 2023-11-17 DIAGNOSIS — D689 Coagulation defect, unspecified: Secondary | ICD-10-CM | POA: Diagnosis not present

## 2023-11-17 DIAGNOSIS — N186 End stage renal disease: Secondary | ICD-10-CM | POA: Diagnosis not present

## 2023-11-17 DIAGNOSIS — N2581 Secondary hyperparathyroidism of renal origin: Secondary | ICD-10-CM | POA: Diagnosis not present

## 2023-11-18 DIAGNOSIS — Z794 Long term (current) use of insulin: Secondary | ICD-10-CM | POA: Diagnosis not present

## 2023-11-18 DIAGNOSIS — I5020 Unspecified systolic (congestive) heart failure: Secondary | ICD-10-CM | POA: Diagnosis not present

## 2023-11-18 DIAGNOSIS — K5900 Constipation, unspecified: Secondary | ICD-10-CM | POA: Diagnosis not present

## 2023-11-18 DIAGNOSIS — G309 Alzheimer's disease, unspecified: Secondary | ICD-10-CM | POA: Diagnosis not present

## 2023-11-18 DIAGNOSIS — Z931 Gastrostomy status: Secondary | ICD-10-CM | POA: Diagnosis not present

## 2023-11-18 DIAGNOSIS — E039 Hypothyroidism, unspecified: Secondary | ICD-10-CM | POA: Diagnosis not present

## 2023-11-18 DIAGNOSIS — I69354 Hemiplegia and hemiparesis following cerebral infarction affecting left non-dominant side: Secondary | ICD-10-CM | POA: Diagnosis not present

## 2023-11-18 DIAGNOSIS — Z992 Dependence on renal dialysis: Secondary | ICD-10-CM | POA: Diagnosis not present

## 2023-11-18 DIAGNOSIS — I252 Old myocardial infarction: Secondary | ICD-10-CM | POA: Diagnosis not present

## 2023-11-18 DIAGNOSIS — Z556 Problems related to health literacy: Secondary | ICD-10-CM | POA: Diagnosis not present

## 2023-11-18 DIAGNOSIS — N186 End stage renal disease: Secondary | ICD-10-CM | POA: Diagnosis not present

## 2023-11-18 DIAGNOSIS — E1142 Type 2 diabetes mellitus with diabetic polyneuropathy: Secondary | ICD-10-CM | POA: Diagnosis not present

## 2023-11-18 DIAGNOSIS — E1122 Type 2 diabetes mellitus with diabetic chronic kidney disease: Secondary | ICD-10-CM | POA: Diagnosis not present

## 2023-11-18 DIAGNOSIS — I132 Hypertensive heart and chronic kidney disease with heart failure and with stage 5 chronic kidney disease, or end stage renal disease: Secondary | ICD-10-CM | POA: Diagnosis not present

## 2023-11-18 DIAGNOSIS — Z7901 Long term (current) use of anticoagulants: Secondary | ICD-10-CM | POA: Diagnosis not present

## 2023-11-18 DIAGNOSIS — I69391 Dysphagia following cerebral infarction: Secondary | ICD-10-CM | POA: Diagnosis not present

## 2023-11-18 DIAGNOSIS — Z86718 Personal history of other venous thrombosis and embolism: Secondary | ICD-10-CM | POA: Diagnosis not present

## 2023-11-19 DIAGNOSIS — D689 Coagulation defect, unspecified: Secondary | ICD-10-CM | POA: Diagnosis not present

## 2023-11-19 DIAGNOSIS — N2581 Secondary hyperparathyroidism of renal origin: Secondary | ICD-10-CM | POA: Diagnosis not present

## 2023-11-19 DIAGNOSIS — D631 Anemia in chronic kidney disease: Secondary | ICD-10-CM | POA: Diagnosis not present

## 2023-11-19 DIAGNOSIS — D509 Iron deficiency anemia, unspecified: Secondary | ICD-10-CM | POA: Diagnosis not present

## 2023-11-19 DIAGNOSIS — Z992 Dependence on renal dialysis: Secondary | ICD-10-CM | POA: Diagnosis not present

## 2023-11-19 DIAGNOSIS — N186 End stage renal disease: Secondary | ICD-10-CM | POA: Diagnosis not present

## 2023-11-21 DIAGNOSIS — E7849 Other hyperlipidemia: Secondary | ICD-10-CM | POA: Diagnosis not present

## 2023-11-21 DIAGNOSIS — N186 End stage renal disease: Secondary | ICD-10-CM | POA: Diagnosis not present

## 2023-11-21 DIAGNOSIS — R1312 Dysphagia, oropharyngeal phase: Secondary | ICD-10-CM | POA: Diagnosis not present

## 2023-11-21 DIAGNOSIS — I222 Subsequent non-ST elevation (NSTEMI) myocardial infarction: Secondary | ICD-10-CM | POA: Diagnosis not present

## 2023-11-21 DIAGNOSIS — Z931 Gastrostomy status: Secondary | ICD-10-CM | POA: Diagnosis not present

## 2023-11-22 ENCOUNTER — Other Ambulatory Visit: Payer: Self-pay | Admitting: Sports Medicine

## 2023-11-22 DIAGNOSIS — Z992 Dependence on renal dialysis: Secondary | ICD-10-CM | POA: Diagnosis not present

## 2023-11-22 DIAGNOSIS — D689 Coagulation defect, unspecified: Secondary | ICD-10-CM | POA: Diagnosis not present

## 2023-11-22 DIAGNOSIS — N186 End stage renal disease: Secondary | ICD-10-CM | POA: Diagnosis not present

## 2023-11-22 DIAGNOSIS — R000 Tachycardia, unspecified: Secondary | ICD-10-CM

## 2023-11-22 DIAGNOSIS — D631 Anemia in chronic kidney disease: Secondary | ICD-10-CM | POA: Diagnosis not present

## 2023-11-22 DIAGNOSIS — Z86718 Personal history of other venous thrombosis and embolism: Secondary | ICD-10-CM

## 2023-11-22 DIAGNOSIS — N2581 Secondary hyperparathyroidism of renal origin: Secondary | ICD-10-CM | POA: Diagnosis not present

## 2023-11-22 DIAGNOSIS — I6350 Cerebral infarction due to unspecified occlusion or stenosis of unspecified cerebral artery: Secondary | ICD-10-CM

## 2023-11-22 DIAGNOSIS — D509 Iron deficiency anemia, unspecified: Secondary | ICD-10-CM | POA: Diagnosis not present

## 2023-11-22 DIAGNOSIS — E1122 Type 2 diabetes mellitus with diabetic chronic kidney disease: Secondary | ICD-10-CM | POA: Diagnosis not present

## 2023-11-22 NOTE — Telephone Encounter (Unsigned)
 Copied from CRM [id number]. Topic: Clinical - Medication Refill >> Nov 22, 2023  4:41 PM Thomes Dinning wrote: Most Recent Primary Care Visit:  Provider: [name]  Department: [facility]  Visit Type: OFFICE VISIT  Date: 10/19/2023  Medication: apixaban (ELIQUIS) 5 MG TABS tablet carvedilol (COREG) 3.125 MG tablet  Has the patient contacted their pharmacy? Yes (Agent: If no, request that the patient contact the pharmacy for the refill. If patient does not wish to contact the pharmacy document the reason why and proceed with request.) (Agent: If yes, when and what did the pharmacy advise?)  Is this the correct pharmacy for this prescription? Yes If no, delete pharmacy and type the correct one.  This is the patient's preferred pharmacy:  [facility] [address], [place] - [address] [place] [address] Phone: [phone] Fax: [phone]  Has the prescription been filled recently? No  Is the patient out of the medication? Yes  Has the patient been seen for an appointment in the last year OR does the patient have an upcoming appointment? Yes  Can we respond through MyChart? Yes  Agent: Please be advised that Rx refills may take up to 3 business days. We ask that you follow-up with your pharmacy.

## 2023-11-23 ENCOUNTER — Encounter: Payer: Self-pay | Admitting: Sports Medicine

## 2023-11-23 DIAGNOSIS — I252 Old myocardial infarction: Secondary | ICD-10-CM | POA: Diagnosis not present

## 2023-11-23 DIAGNOSIS — Z86718 Personal history of other venous thrombosis and embolism: Secondary | ICD-10-CM | POA: Diagnosis not present

## 2023-11-23 DIAGNOSIS — G309 Alzheimer's disease, unspecified: Secondary | ICD-10-CM | POA: Diagnosis not present

## 2023-11-23 DIAGNOSIS — K5900 Constipation, unspecified: Secondary | ICD-10-CM | POA: Diagnosis not present

## 2023-11-23 DIAGNOSIS — I5020 Unspecified systolic (congestive) heart failure: Secondary | ICD-10-CM | POA: Diagnosis not present

## 2023-11-23 DIAGNOSIS — Z7901 Long term (current) use of anticoagulants: Secondary | ICD-10-CM | POA: Diagnosis not present

## 2023-11-23 DIAGNOSIS — I69354 Hemiplegia and hemiparesis following cerebral infarction affecting left non-dominant side: Secondary | ICD-10-CM | POA: Diagnosis not present

## 2023-11-23 DIAGNOSIS — I69391 Dysphagia following cerebral infarction: Secondary | ICD-10-CM | POA: Diagnosis not present

## 2023-11-23 DIAGNOSIS — E039 Hypothyroidism, unspecified: Secondary | ICD-10-CM | POA: Diagnosis not present

## 2023-11-23 DIAGNOSIS — Z931 Gastrostomy status: Secondary | ICD-10-CM | POA: Diagnosis not present

## 2023-11-23 DIAGNOSIS — E1142 Type 2 diabetes mellitus with diabetic polyneuropathy: Secondary | ICD-10-CM | POA: Diagnosis not present

## 2023-11-23 DIAGNOSIS — E1122 Type 2 diabetes mellitus with diabetic chronic kidney disease: Secondary | ICD-10-CM | POA: Diagnosis not present

## 2023-11-23 DIAGNOSIS — Z992 Dependence on renal dialysis: Secondary | ICD-10-CM | POA: Diagnosis not present

## 2023-11-23 DIAGNOSIS — N186 End stage renal disease: Secondary | ICD-10-CM | POA: Diagnosis not present

## 2023-11-23 DIAGNOSIS — I132 Hypertensive heart and chronic kidney disease with heart failure and with stage 5 chronic kidney disease, or end stage renal disease: Secondary | ICD-10-CM | POA: Diagnosis not present

## 2023-11-23 DIAGNOSIS — Z556 Problems related to health literacy: Secondary | ICD-10-CM | POA: Diagnosis not present

## 2023-11-23 DIAGNOSIS — Z794 Long term (current) use of insulin: Secondary | ICD-10-CM | POA: Diagnosis not present

## 2023-11-23 NOTE — Telephone Encounter (Signed)
 Patient requested refill.  Pended Rx's and sent to Dr. Jacquenette Shone for approval.

## 2023-11-23 NOTE — Telephone Encounter (Signed)
 Spoke with patient daughter to notify her that the medication are pending to be sign from [name], MD for medication to be sent to the pharmacy.

## 2023-11-24 DIAGNOSIS — E1122 Type 2 diabetes mellitus with diabetic chronic kidney disease: Secondary | ICD-10-CM | POA: Diagnosis not present

## 2023-11-24 DIAGNOSIS — D689 Coagulation defect, unspecified: Secondary | ICD-10-CM | POA: Diagnosis not present

## 2023-11-24 DIAGNOSIS — R110 Nausea: Secondary | ICD-10-CM | POA: Diagnosis not present

## 2023-11-24 DIAGNOSIS — D509 Iron deficiency anemia, unspecified: Secondary | ICD-10-CM | POA: Diagnosis not present

## 2023-11-24 DIAGNOSIS — Z992 Dependence on renal dialysis: Secondary | ICD-10-CM | POA: Diagnosis not present

## 2023-11-24 DIAGNOSIS — N2581 Secondary hyperparathyroidism of renal origin: Secondary | ICD-10-CM | POA: Diagnosis not present

## 2023-11-24 DIAGNOSIS — D631 Anemia in chronic kidney disease: Secondary | ICD-10-CM | POA: Diagnosis not present

## 2023-11-24 DIAGNOSIS — N186 End stage renal disease: Secondary | ICD-10-CM | POA: Diagnosis not present

## 2023-11-24 MED ORDER — APIXABAN 2.5 MG PO TABS: 2.5000 mg | ORAL_TABLET | Freq: Two times a day (BID) | ORAL | 2 refills | Status: DC

## 2023-11-24 MED ORDER — CARVEDILOL 3.125 MG PO TABS: ORAL_TABLET | ORAL | 5 refills | Status: DC

## 2023-11-24 NOTE — Telephone Encounter (Signed)
    Patient aware of medication change and refills sent to pharmacy

## 2023-11-25 DIAGNOSIS — K5909 Other constipation: Secondary | ICD-10-CM | POA: Diagnosis not present

## 2023-11-25 DIAGNOSIS — I132 Hypertensive heart and chronic kidney disease with heart failure and with stage 5 chronic kidney disease, or end stage renal disease: Secondary | ICD-10-CM | POA: Diagnosis not present

## 2023-11-25 DIAGNOSIS — N186 End stage renal disease: Secondary | ICD-10-CM | POA: Diagnosis not present

## 2023-11-25 DIAGNOSIS — Z992 Dependence on renal dialysis: Secondary | ICD-10-CM | POA: Diagnosis not present

## 2023-11-25 DIAGNOSIS — Z7901 Long term (current) use of anticoagulants: Secondary | ICD-10-CM | POA: Diagnosis not present

## 2023-11-25 DIAGNOSIS — Z794 Long term (current) use of insulin: Secondary | ICD-10-CM | POA: Diagnosis not present

## 2023-11-25 DIAGNOSIS — I5020 Unspecified systolic (congestive) heart failure: Secondary | ICD-10-CM | POA: Diagnosis not present

## 2023-11-25 DIAGNOSIS — Z931 Gastrostomy status: Secondary | ICD-10-CM | POA: Diagnosis not present

## 2023-11-25 DIAGNOSIS — Z556 Problems related to health literacy: Secondary | ICD-10-CM | POA: Diagnosis not present

## 2023-11-25 DIAGNOSIS — E039 Hypothyroidism, unspecified: Secondary | ICD-10-CM | POA: Diagnosis not present

## 2023-11-25 DIAGNOSIS — G309 Alzheimer's disease, unspecified: Secondary | ICD-10-CM | POA: Diagnosis not present

## 2023-11-25 DIAGNOSIS — I252 Old myocardial infarction: Secondary | ICD-10-CM | POA: Diagnosis not present

## 2023-11-25 DIAGNOSIS — K5900 Constipation, unspecified: Secondary | ICD-10-CM | POA: Diagnosis not present

## 2023-11-25 DIAGNOSIS — I69354 Hemiplegia and hemiparesis following cerebral infarction affecting left non-dominant side: Secondary | ICD-10-CM | POA: Diagnosis not present

## 2023-11-25 DIAGNOSIS — G8929 Other chronic pain: Secondary | ICD-10-CM | POA: Diagnosis not present

## 2023-11-25 DIAGNOSIS — Z86718 Personal history of other venous thrombosis and embolism: Secondary | ICD-10-CM | POA: Diagnosis not present

## 2023-11-25 DIAGNOSIS — E1142 Type 2 diabetes mellitus with diabetic polyneuropathy: Secondary | ICD-10-CM | POA: Diagnosis not present

## 2023-11-25 DIAGNOSIS — E1122 Type 2 diabetes mellitus with diabetic chronic kidney disease: Secondary | ICD-10-CM | POA: Diagnosis not present

## 2023-11-25 DIAGNOSIS — I69391 Dysphagia following cerebral infarction: Secondary | ICD-10-CM | POA: Diagnosis not present

## 2023-11-25 DIAGNOSIS — R109 Unspecified abdominal pain: Secondary | ICD-10-CM | POA: Diagnosis not present

## 2023-11-26 DIAGNOSIS — I222 Subsequent non-ST elevation (NSTEMI) myocardial infarction: Secondary | ICD-10-CM | POA: Diagnosis not present

## 2023-11-26 DIAGNOSIS — D689 Coagulation defect, unspecified: Secondary | ICD-10-CM | POA: Diagnosis not present

## 2023-11-26 DIAGNOSIS — E1122 Type 2 diabetes mellitus with diabetic chronic kidney disease: Secondary | ICD-10-CM | POA: Diagnosis not present

## 2023-11-26 DIAGNOSIS — N2581 Secondary hyperparathyroidism of renal origin: Secondary | ICD-10-CM | POA: Diagnosis not present

## 2023-11-26 DIAGNOSIS — D631 Anemia in chronic kidney disease: Secondary | ICD-10-CM | POA: Diagnosis not present

## 2023-11-26 DIAGNOSIS — E7849 Other hyperlipidemia: Secondary | ICD-10-CM | POA: Diagnosis not present

## 2023-11-26 DIAGNOSIS — N186 End stage renal disease: Secondary | ICD-10-CM | POA: Diagnosis not present

## 2023-11-26 DIAGNOSIS — R110 Nausea: Secondary | ICD-10-CM | POA: Diagnosis not present

## 2023-11-26 DIAGNOSIS — Z931 Gastrostomy status: Secondary | ICD-10-CM | POA: Diagnosis not present

## 2023-11-26 DIAGNOSIS — D509 Iron deficiency anemia, unspecified: Secondary | ICD-10-CM | POA: Diagnosis not present

## 2023-11-26 DIAGNOSIS — Z992 Dependence on renal dialysis: Secondary | ICD-10-CM | POA: Diagnosis not present

## 2023-11-26 DIAGNOSIS — R1312 Dysphagia, oropharyngeal phase: Secondary | ICD-10-CM | POA: Diagnosis not present

## 2023-11-29 DIAGNOSIS — Z992 Dependence on renal dialysis: Secondary | ICD-10-CM | POA: Diagnosis not present

## 2023-11-29 DIAGNOSIS — N186 End stage renal disease: Secondary | ICD-10-CM | POA: Diagnosis not present

## 2023-11-29 DIAGNOSIS — N2581 Secondary hyperparathyroidism of renal origin: Secondary | ICD-10-CM | POA: Diagnosis not present

## 2023-11-29 DIAGNOSIS — D631 Anemia in chronic kidney disease: Secondary | ICD-10-CM | POA: Diagnosis not present

## 2023-11-29 DIAGNOSIS — D509 Iron deficiency anemia, unspecified: Secondary | ICD-10-CM | POA: Diagnosis not present

## 2023-11-29 DIAGNOSIS — E1122 Type 2 diabetes mellitus with diabetic chronic kidney disease: Secondary | ICD-10-CM | POA: Diagnosis not present

## 2023-11-29 DIAGNOSIS — R110 Nausea: Secondary | ICD-10-CM | POA: Diagnosis not present

## 2023-11-29 DIAGNOSIS — D689 Coagulation defect, unspecified: Secondary | ICD-10-CM | POA: Diagnosis not present

## 2023-11-30 ENCOUNTER — Other Ambulatory Visit (HOSPITAL_COMMUNITY)

## 2023-11-30 ENCOUNTER — Ambulatory Visit (HOSPITAL_COMMUNITY)
Admission: RE | Admit: 2023-11-30 | Discharge: 2023-11-30 | Disposition: A | Discharge status: Home / Self Care | Payer: BLUE CROSS/BLUE SHIELD | Source: Ambulatory Visit | Attending: Interventional Radiology | Admitting: Interventional Radiology

## 2023-11-30 ENCOUNTER — Other Ambulatory Visit (HOSPITAL_COMMUNITY): Payer: Self-pay | Admitting: Interventional Radiology

## 2023-11-30 ENCOUNTER — Other Ambulatory Visit (HOSPITAL_COMMUNITY): Payer: Self-pay | Admitting: Diagnostic Radiology

## 2023-11-30 DIAGNOSIS — N186 End stage renal disease: Secondary | ICD-10-CM

## 2023-11-30 DIAGNOSIS — Z434 Encounter for attention to other artificial openings of digestive tract: Secondary | ICD-10-CM | POA: Insufficient documentation

## 2023-11-30 DIAGNOSIS — R633 Feeding difficulties, unspecified: Secondary | ICD-10-CM | POA: Diagnosis not present

## 2023-11-30 DIAGNOSIS — K9429 Other complications of gastrostomy: Secondary | ICD-10-CM | POA: Diagnosis not present

## 2023-11-30 DIAGNOSIS — R1032 Left lower quadrant pain: Secondary | ICD-10-CM

## 2023-11-30 DIAGNOSIS — Z431 Encounter for attention to gastrostomy: Secondary | ICD-10-CM | POA: Insufficient documentation

## 2023-11-30 HISTORY — PX: IR REPLC GASTRO/COLONIC TUBE PERCUT W/FLUORO: IMG2333

## 2023-11-30 HISTORY — PX: IR EXCHANGE BILIARY DRAIN: IMG6046

## 2023-11-30 MED ORDER — IOHEXOL 300 MG/ML  SOLN
50.0000 mL | Freq: Once | INTRAMUSCULAR | Status: AC | PRN
  Administered 2023-11-30: 15 mL

## 2023-11-30 MED ORDER — LIDOCAINE VISCOUS HCL 2 % MT SOLN
OROMUCOSAL | Status: AC
Start: 2023-11-30 — End: ?
  Filled 2023-11-30: qty 15

## 2023-11-30 MED ORDER — LIDOCAINE HCL 1 % IJ SOLN
INTRAMUSCULAR | Status: AC
  Filled 2023-11-30: qty 20

## 2023-11-30 MED ORDER — LIDOCAINE HCL 1 % IJ SOLN
20.0000 mL | Freq: Once | INTRAMUSCULAR | Status: AC
  Administered 2023-11-30: 10 mL via INTRADERMAL

## 2023-11-30 NOTE — Procedures (Signed)
 Interventional Radiology Procedure:   Indications: Routine exchange of cholecystostomy tube and gastrostomy tube  Procedure:  Exchange of cholecystostomy tube and gastrostomy tube  Findings: New 10 Fr cholecystostomy tube placed.  New 20 Fr gastrostomy tube placed.  Complications: None     EBL: Minimal  Plan: Continue with routine exchange of cholecystostomy tube.   Omarr Hann R. Lowella Dandy, MD  Pager: [phone]

## 2023-12-01 DIAGNOSIS — D631 Anemia in chronic kidney disease: Secondary | ICD-10-CM | POA: Diagnosis not present

## 2023-12-01 DIAGNOSIS — R110 Nausea: Secondary | ICD-10-CM | POA: Diagnosis not present

## 2023-12-01 DIAGNOSIS — N2581 Secondary hyperparathyroidism of renal origin: Secondary | ICD-10-CM | POA: Diagnosis not present

## 2023-12-01 DIAGNOSIS — N186 End stage renal disease: Secondary | ICD-10-CM | POA: Diagnosis not present

## 2023-12-01 DIAGNOSIS — E1122 Type 2 diabetes mellitus with diabetic chronic kidney disease: Secondary | ICD-10-CM | POA: Diagnosis not present

## 2023-12-01 DIAGNOSIS — D509 Iron deficiency anemia, unspecified: Secondary | ICD-10-CM | POA: Diagnosis not present

## 2023-12-01 DIAGNOSIS — D689 Coagulation defect, unspecified: Secondary | ICD-10-CM | POA: Diagnosis not present

## 2023-12-01 DIAGNOSIS — Z992 Dependence on renal dialysis: Secondary | ICD-10-CM | POA: Diagnosis not present

## 2023-12-02 ENCOUNTER — Other Ambulatory Visit (HOSPITAL_COMMUNITY): Payer: Self-pay | Admitting: Interventional Radiology

## 2023-12-02 ENCOUNTER — Encounter (HOSPITAL_COMMUNITY): Payer: Self-pay

## 2023-12-02 DIAGNOSIS — E039 Hypothyroidism, unspecified: Secondary | ICD-10-CM | POA: Diagnosis not present

## 2023-12-02 DIAGNOSIS — Z86718 Personal history of other venous thrombosis and embolism: Secondary | ICD-10-CM | POA: Diagnosis not present

## 2023-12-02 DIAGNOSIS — Z931 Gastrostomy status: Secondary | ICD-10-CM | POA: Diagnosis not present

## 2023-12-02 DIAGNOSIS — I69354 Hemiplegia and hemiparesis following cerebral infarction affecting left non-dominant side: Secondary | ICD-10-CM | POA: Diagnosis not present

## 2023-12-02 DIAGNOSIS — I132 Hypertensive heart and chronic kidney disease with heart failure and with stage 5 chronic kidney disease, or end stage renal disease: Secondary | ICD-10-CM | POA: Diagnosis not present

## 2023-12-02 DIAGNOSIS — E1122 Type 2 diabetes mellitus with diabetic chronic kidney disease: Secondary | ICD-10-CM | POA: Diagnosis not present

## 2023-12-02 DIAGNOSIS — N186 End stage renal disease: Secondary | ICD-10-CM

## 2023-12-02 DIAGNOSIS — I252 Old myocardial infarction: Secondary | ICD-10-CM | POA: Diagnosis not present

## 2023-12-02 DIAGNOSIS — G309 Alzheimer's disease, unspecified: Secondary | ICD-10-CM | POA: Diagnosis not present

## 2023-12-02 DIAGNOSIS — I5020 Unspecified systolic (congestive) heart failure: Secondary | ICD-10-CM | POA: Diagnosis not present

## 2023-12-02 DIAGNOSIS — R633 Feeding difficulties, unspecified: Secondary | ICD-10-CM

## 2023-12-02 DIAGNOSIS — E1142 Type 2 diabetes mellitus with diabetic polyneuropathy: Secondary | ICD-10-CM | POA: Diagnosis not present

## 2023-12-02 DIAGNOSIS — K5900 Constipation, unspecified: Secondary | ICD-10-CM | POA: Diagnosis not present

## 2023-12-02 DIAGNOSIS — I69391 Dysphagia following cerebral infarction: Secondary | ICD-10-CM | POA: Diagnosis not present

## 2023-12-02 DIAGNOSIS — Z556 Problems related to health literacy: Secondary | ICD-10-CM | POA: Diagnosis not present

## 2023-12-02 DIAGNOSIS — Z794 Long term (current) use of insulin: Secondary | ICD-10-CM | POA: Diagnosis not present

## 2023-12-02 DIAGNOSIS — Z992 Dependence on renal dialysis: Secondary | ICD-10-CM | POA: Diagnosis not present

## 2023-12-02 DIAGNOSIS — Z7901 Long term (current) use of anticoagulants: Secondary | ICD-10-CM | POA: Diagnosis not present

## 2023-12-03 DIAGNOSIS — D631 Anemia in chronic kidney disease: Secondary | ICD-10-CM | POA: Diagnosis not present

## 2023-12-03 DIAGNOSIS — Z992 Dependence on renal dialysis: Secondary | ICD-10-CM | POA: Diagnosis not present

## 2023-12-03 DIAGNOSIS — N186 End stage renal disease: Secondary | ICD-10-CM | POA: Diagnosis not present

## 2023-12-03 DIAGNOSIS — E1122 Type 2 diabetes mellitus with diabetic chronic kidney disease: Secondary | ICD-10-CM | POA: Diagnosis not present

## 2023-12-03 DIAGNOSIS — D509 Iron deficiency anemia, unspecified: Secondary | ICD-10-CM | POA: Diagnosis not present

## 2023-12-03 DIAGNOSIS — R110 Nausea: Secondary | ICD-10-CM | POA: Diagnosis not present

## 2023-12-03 DIAGNOSIS — D689 Coagulation defect, unspecified: Secondary | ICD-10-CM | POA: Diagnosis not present

## 2023-12-03 DIAGNOSIS — N2581 Secondary hyperparathyroidism of renal origin: Secondary | ICD-10-CM | POA: Diagnosis not present

## 2023-12-05 ENCOUNTER — Telehealth: Admitting: Family Medicine

## 2023-12-05 DIAGNOSIS — J40 Bronchitis, not specified as acute or chronic: Secondary | ICD-10-CM | POA: Diagnosis not present

## 2023-12-05 DIAGNOSIS — N764 Abscess of vulva: Secondary | ICD-10-CM

## 2023-12-05 MED ORDER — DOXYCYCLINE HYCLATE 100 MG PO TABS: 100.0000 mg | ORAL_TABLET | Freq: Two times a day (BID) | ORAL | 0 refills | Status: DC

## 2023-12-05 NOTE — Patient Instructions (Signed)
 Skin Abscess  A skin abscess is an infected area on or under your skin. It contains pus and other material. An abscess may also be called a furuncle, carbuncle, or boil. It is often the result of an infection caused by bacteria. An abscess can occur in or on almost any part of your body. Sometimes, an abscess may break open (rupture) on its own. In most cases, it will keep getting worse unless it is treated. An abscess can cause pain and make you feel ill. An untreated abscess can cause infection to spread to other parts of your body or your bloodstream. The abscess may need to be drained. You may also need to take antibiotics. What are the causes? An abscess occurs when germs, like bacteria, pass through your skin and cause an infection. This may be caused by: A scrape or cut on your skin. A puncture wound through your skin, such as a needle injection or insect bite. Blocked oil or sweat glands. Blocked and infected hair follicles. A fluid-filled sac that forms beneath your skin (sebaceous cyst) and becomes infected. What increases the risk? You may be more likely to develop an abscess if: You have problems with blood circulation, or you have a weak body defense system (immune system). You have diabetes. You have dry and irritated skin. You get injections often or use IV drugs. You have a foreign body in a wound, such as a splinter. You smoke or use tobacco products. What are the signs or symptoms? Symptoms of this condition include: A painful, firm bump under the skin. A bump with pus at the top. This may break through the skin and drain. Other symptoms include: Redness and swelling around the abscess. Warmth or tenderness. Swelling of the lymph nodes (glands) near the abscess. A sore on the skin. How is this diagnosed? This condition may be diagnosed based on a physical exam and your medical history. You may also have tests done, such as: A test of a sample of pus. This may be done  to find what is causing the infection. Blood tests. Imaging tests, such as an ultrasound, CT scan, or MRI. How is this treated? A small abscess that drains on its own may not need to be treated. Treatment for larger abscesses may include: Moist heat or a heat pack applied to the area a few times a day. Incision and drainage. This is a procedure to drain the abscess. Antibiotics. For a severe abscess, you may first get antibiotics through an IV and then change to antibiotics by mouth. Follow these instructions at home: Medicines Take over-the-counter and prescription medicines only as told by your provider. If you were prescribed antibiotics, take them as told by your provider. Do not stop using the antibiotic even if you start to feel better. Abscess care  If you have an abscess that has not drained, apply heat to the affected area. Use the heat source that your provider recommends, such as a moist heat pack or a heating pad. Place a towel between your skin and the heat source. Leave the heat on for 20-30 minutes at a time. If your skin turns bright red, remove the heat right away to prevent burns. The risk of burns is higher if you cannot feel pain, heat, or cold. Follow instructions from your provider about how to take care of your abscess. Make sure you: Cover the abscess with a bandage (dressing). Wash your hands with soap and water for at least 20 seconds before  and after you change the dressing or gauze. If soap and water are not available, use hand sanitizer. Change your dressing or gauze as told by your provider. Check your abscess every day for signs of an infection that is getting worse. Check for: More redness, swelling, pain, or tenderness. More fluid or blood. Warmth. More pus or a worse smell. General instructions To avoid spreading the infection: Do not share personal care items, towels, or hot tubs with others. Avoid making skin contact with other people. Be careful  when getting rid of used dressings, wound packing, or any drainage from the abscess. Do not use any products that contain nicotine or tobacco. These products include cigarettes, chewing tobacco, and vaping devices, such as e-cigarettes. If you need help quitting, ask your provider. Do not use any creams, ointments, or liquids unless you have been told to by your provider. Contact a health care provider if: You see redness that spreads quickly or red streaks on your skin spreading away from the abscess. You have any signs of worse infection at the abscess. You vomit every time you eat or drink. You have a fever, chills, or muscle aches. The cyst or abscess returns. Get help right away if: You have severe pain. You make less pee (urine) than normal. This information is not intended to replace advice given to you by your health care provider. Make sure you discuss any questions you have with your health care provider. Document Revised: 03/25/2022 Document Reviewed: 03/25/2022 Elsevier Patient Education  2024 [employer].

## 2023-12-05 NOTE — Progress Notes (Signed)
 Virtual Visit Consent   Samantha [name], you are scheduled for a virtual visit with a [facility] provider today. Just as with appointments in the office, your consent must be obtained to participate. Your consent will be active for this visit and any virtual visit you may have with one of our providers in the next 365 days. If you have a MyChart account, a copy of this consent can be sent to you electronically.  As this is a virtual visit, video technology does not allow for your provider to perform a traditional examination. This may limit your provider's ability to fully assess your condition. If your provider identifies any concerns that need to be evaluated in person or the need to arrange testing (such as labs, EKG, etc.), we will make arrangements to do so. Although advances in technology are sophisticated, we cannot ensure that it will always work on either your end or our end. If the connection with a video visit is poor, the visit may have to be switched to a telephone visit. With either a video or telephone visit, we are not always able to ensure that we have a secure connection.  By engaging in this virtual visit, you consent to the provision of healthcare and authorize for your insurance to be billed (if applicable) for the services provided during this visit. Depending on your insurance coverage, you may receive a charge related to this service.  I need to obtain your verbal consent now. Are you willing to proceed with your visit today? Samantha [name] has provided verbal consent on 12/05/2023 for a virtual visit (video or telephone). Samantha Huger, FNP  Date: 12/05/2023 2:34 PM   Virtual Visit via Video Note   I, Samantha [name], connected with  Samantha [name]  ([id number], 10-Jun-1946) on 12/05/23 at  2:30 PM EDT by a video-enabled telemedicine application and verified that I am speaking with the correct person using two identifiers.  Location: Patient: Virtual Visit Location Patient:  Home Provider: Virtual Visit Location Provider: Home Office   I discussed the limitations of evaluation and management by telemedicine and the availability of in person appointments. The patient expressed understanding and agreed to proceed.    History of Present Illness: Samantha [name] is a 78 y.o. who identifies as a female who was assigned female at birth, and is being seen today for cough with yellow mucus for a week, temp up to 99.5, no wheezing or sob, in no distress. She is bedridden. Her daughter is there caring for her. She also reports an area that looks like a pimple on labia red, open and draining. 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) 05/20/2023   SIRS (systemic inflammatory response syndrome) (HCC) 04/06/2023   Pressure injury of skin 04/06/2023   Sepsis (HCC) 04/01/2023   Fever 03/31/2023   Constipation 03/31/2023   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   Epigastric pain 12/13/2022   End-stage renal disease on hemodialysis (HCC) 12/13/2022   Failure to thrive in adult 12/13/2022   DM2 (diabetes  mellitus, type 2) (HCC) 12/13/2022   Essential hypertension 12/13/2022   History of DVT (deep vein thrombosis) 12/13/2022   Acute cholecystitis 12/12/2022   Diabetic peripheral neuropathy (HCC) 07/12/2019   Dementia due to Alzheimer's disease (HCC) 07/12/2019   Cerebrovascular accident (CVA) due to occlusion of cerebral artery (HCC) 07/12/2019    Allergies:  Allergies   Allergen Reactions   Asa [Aspirin] Anaphylaxis   Penicillins Anaphylaxis    **Tolerates cephalosporins   Medications:  Current Outpatient Medications:    acetaminophen (TYLENOL) 500 MG tablet, Place 500 mg into feeding tube every 6 (six) hours as needed for mild pain., Disp: , Rfl:    apixaban (ELIQUIS) 2.5 MG TABS tablet, Take 1 tablet (2.5 mg total) by mouth 2 (two) times daily., Disp: 180 tablet, Rfl: 2   atorvastatin (LIPITOR) 10 MG tablet, Take 1 tablet (10 mg total) by mouth daily., Disp: 90 tablet, Rfl: 1   carvedilol (COREG) 3.125 MG tablet, Take 3.125 mg Q AM  and 3.125 mg Q PM on non dialysis days (Tues, Thurs, Sat and Sun,  - On dialysis days,  MWF, continue Coreg 3.125 mg Q PM -  Hold for SBP < 100, Disp: 60 tablet, Rfl: 5   Glucagon (BAQSIMI ONE PACK) 3 MG/DOSE POWD, Place 1 packet into the nose as needed (For blood sugar). Use when blood sugar drops below 70., Disp: , Rfl:    insulin aspart (NOVOLOG) 100 UNIT/ML injection, 0-9 Units, Subcutaneous, 3 times daily with meals, CBG < 70: Implement Hypoglycemia measures CBG 70 - 120: 0 units CBG 121 - 150: 1 unit CBG 151 - 200: 2 units CBG 201 - 250: 3 units CBG 251 - 300: 5 units CBG 301 - 350: 7 units CBG 351 - 400: 9 units CBG > 400: call MD, Disp: 10 mL, Rfl: 11   insulin glargine (LANTUS) 100 UNIT/ML Solostar Pen, Inject 20 Units into the skin at bedtime., Disp: , Rfl:    multivitamin (RENA-VIT) TABS tablet, Take 1 tablet by mouth at bedtime., Disp: , Rfl: 0   Nutritional Supplements (FEEDING SUPPLEMENT, OSMOLITE 1.5 CAL,) LIQD, Place 1,000 mLs into feeding tube See admin instructions. 8p-6a, Disp: , Rfl:    promethazine (PHENERGAN) 25 MG tablet, Take 0.5 tablets (12.5 mg total) by mouth every 6 (six) hours as needed for nausea or vomiting., Disp: 30 tablet, Rfl: 0   senna (SENOKOT) 8.6 MG TABS tablet, Take 1 tablet (8.6 mg total) by mouth daily., Disp: 120 tablet, Rfl: 0  Observations/Objective: Patient is well-developed,  well-nourished in no acute distress.  Resting comfortably  at home.  Head is normocephalic, atraumatic.  No labored breathing.  Speech is clear and coherent with logical content.  Patient is alert and oriented at baseline.    Assessment and Plan: 1. Abscess of labia (Primary)  2. Bronchitis  Keep wound clean and dry, warm compresses, increase fluids, humidifier at night, continue tussin. UC or ED if sx persist or worsen.   Follow Up Instructions: I discussed the assessment and treatment plan with the patient. The patient was provided an opportunity to ask questions and all were answered. The patient agreed with the plan and demonstrated an understanding of the instructions.  A copy of instructions were sent to the patient via MyChart unless otherwise noted below.     The patient was advised to call back or seek an in-person evaluation if the symptoms worsen or if the condition fails to improve as anticipated.    Savan Ruta, FNP

## 2023-12-06 ENCOUNTER — Emergency Department (HOSPITAL_COMMUNITY)

## 2023-12-06 ENCOUNTER — Inpatient Hospital Stay (HOSPITAL_COMMUNITY)
Admission: EM | Admit: 2023-12-06 | Discharge: 2023-12-09 | DRG: 391 | Disposition: A | Discharge status: Home Health | Attending: Student | Admitting: Student

## 2023-12-06 ENCOUNTER — Other Ambulatory Visit: Payer: Self-pay

## 2023-12-06 ENCOUNTER — Encounter (HOSPITAL_COMMUNITY): Payer: Self-pay | Admitting: Internal Medicine

## 2023-12-06 DIAGNOSIS — F0280 Dementia in other diseases classified elsewhere without behavioral disturbance: Secondary | ICD-10-CM | POA: Diagnosis not present

## 2023-12-06 DIAGNOSIS — R509 Fever, unspecified: Secondary | ICD-10-CM | POA: Diagnosis not present

## 2023-12-06 DIAGNOSIS — N764 Abscess of vulva: Secondary | ICD-10-CM | POA: Diagnosis not present

## 2023-12-06 DIAGNOSIS — N186 End stage renal disease: Secondary | ICD-10-CM | POA: Diagnosis present

## 2023-12-06 DIAGNOSIS — E119 Type 2 diabetes mellitus without complications: Secondary | ICD-10-CM

## 2023-12-06 DIAGNOSIS — R933 Abnormal findings on diagnostic imaging of other parts of digestive tract: Secondary | ICD-10-CM | POA: Diagnosis not present

## 2023-12-06 DIAGNOSIS — K5909 Other constipation: Secondary | ICD-10-CM | POA: Diagnosis not present

## 2023-12-06 DIAGNOSIS — I6930 Unspecified sequelae of cerebral infarction: Secondary | ICD-10-CM

## 2023-12-06 DIAGNOSIS — Z931 Gastrostomy status: Secondary | ICD-10-CM

## 2023-12-06 DIAGNOSIS — E871 Hypo-osmolality and hyponatremia: Secondary | ICD-10-CM | POA: Diagnosis present

## 2023-12-06 DIAGNOSIS — I517 Cardiomegaly: Secondary | ICD-10-CM | POA: Diagnosis not present

## 2023-12-06 DIAGNOSIS — D696 Thrombocytopenia, unspecified: Secondary | ICD-10-CM | POA: Diagnosis not present

## 2023-12-06 DIAGNOSIS — Z794 Long term (current) use of insulin: Secondary | ICD-10-CM

## 2023-12-06 DIAGNOSIS — N2581 Secondary hyperparathyroidism of renal origin: Secondary | ICD-10-CM | POA: Diagnosis not present

## 2023-12-06 DIAGNOSIS — I10 Essential (primary) hypertension: Secondary | ICD-10-CM | POA: Diagnosis present

## 2023-12-06 DIAGNOSIS — E16A1 Hypoglycemia level 1: Secondary | ICD-10-CM | POA: Diagnosis present

## 2023-12-06 DIAGNOSIS — N281 Cyst of kidney, acquired: Secondary | ICD-10-CM | POA: Diagnosis not present

## 2023-12-06 DIAGNOSIS — G309 Alzheimer's disease, unspecified: Secondary | ICD-10-CM | POA: Diagnosis not present

## 2023-12-06 DIAGNOSIS — I5020 Unspecified systolic (congestive) heart failure: Secondary | ICD-10-CM | POA: Diagnosis present

## 2023-12-06 DIAGNOSIS — Z992 Dependence on renal dialysis: Secondary | ICD-10-CM

## 2023-12-06 DIAGNOSIS — I69354 Hemiplegia and hemiparesis following cerebral infarction affecting left non-dominant side: Secondary | ICD-10-CM | POA: Diagnosis not present

## 2023-12-06 DIAGNOSIS — E1142 Type 2 diabetes mellitus with diabetic polyneuropathy: Secondary | ICD-10-CM | POA: Diagnosis not present

## 2023-12-06 DIAGNOSIS — E1165 Type 2 diabetes mellitus with hyperglycemia: Secondary | ICD-10-CM | POA: Diagnosis not present

## 2023-12-06 DIAGNOSIS — E11649 Type 2 diabetes mellitus with hypoglycemia without coma: Secondary | ICD-10-CM | POA: Diagnosis present

## 2023-12-06 DIAGNOSIS — E1122 Type 2 diabetes mellitus with diabetic chronic kidney disease: Secondary | ICD-10-CM | POA: Diagnosis not present

## 2023-12-06 DIAGNOSIS — D631 Anemia in chronic kidney disease: Secondary | ICD-10-CM | POA: Diagnosis present

## 2023-12-06 DIAGNOSIS — Z86718 Personal history of other venous thrombosis and embolism: Secondary | ICD-10-CM

## 2023-12-06 DIAGNOSIS — R1111 Vomiting without nausea: Secondary | ICD-10-CM | POA: Diagnosis not present

## 2023-12-06 DIAGNOSIS — E875 Hyperkalemia: Secondary | ICD-10-CM | POA: Diagnosis present

## 2023-12-06 DIAGNOSIS — I252 Old myocardial infarction: Secondary | ICD-10-CM

## 2023-12-06 DIAGNOSIS — K828 Other specified diseases of gallbladder: Secondary | ICD-10-CM | POA: Diagnosis not present

## 2023-12-06 DIAGNOSIS — Z95828 Presence of other vascular implants and grafts: Secondary | ICD-10-CM

## 2023-12-06 DIAGNOSIS — K5289 Other specified noninfective gastroenteritis and colitis: Principal | ICD-10-CM | POA: Diagnosis present

## 2023-12-06 DIAGNOSIS — Z1152 Encounter for screening for COVID-19: Secondary | ICD-10-CM

## 2023-12-06 DIAGNOSIS — Z604 Social exclusion and rejection: Secondary | ICD-10-CM | POA: Diagnosis present

## 2023-12-06 DIAGNOSIS — Z833 Family history of diabetes mellitus: Secondary | ICD-10-CM

## 2023-12-06 DIAGNOSIS — K8020 Calculus of gallbladder without cholecystitis without obstruction: Secondary | ICD-10-CM | POA: Diagnosis not present

## 2023-12-06 DIAGNOSIS — I5022 Chronic systolic (congestive) heart failure: Secondary | ICD-10-CM | POA: Diagnosis not present

## 2023-12-06 DIAGNOSIS — Z0389 Encounter for observation for other suspected diseases and conditions ruled out: Secondary | ICD-10-CM | POA: Diagnosis not present

## 2023-12-06 DIAGNOSIS — L89152 Pressure ulcer of sacral region, stage 2: Secondary | ICD-10-CM | POA: Diagnosis present

## 2023-12-06 DIAGNOSIS — D649 Anemia, unspecified: Secondary | ICD-10-CM | POA: Diagnosis not present

## 2023-12-06 DIAGNOSIS — Z880 Allergy status to penicillin: Secondary | ICD-10-CM

## 2023-12-06 DIAGNOSIS — K5641 Fecal impaction: Secondary | ICD-10-CM | POA: Diagnosis present

## 2023-12-06 DIAGNOSIS — Z886 Allergy status to analgesic agent status: Secondary | ICD-10-CM

## 2023-12-06 DIAGNOSIS — K529 Noninfective gastroenteritis and colitis, unspecified: Secondary | ICD-10-CM | POA: Diagnosis not present

## 2023-12-06 DIAGNOSIS — I132 Hypertensive heart and chronic kidney disease with heart failure and with stage 5 chronic kidney disease, or end stage renal disease: Secondary | ICD-10-CM | POA: Diagnosis present

## 2023-12-06 DIAGNOSIS — I959 Hypotension, unspecified: Secondary | ICD-10-CM | POA: Diagnosis present

## 2023-12-06 DIAGNOSIS — R739 Hyperglycemia, unspecified: Secondary | ICD-10-CM | POA: Diagnosis not present

## 2023-12-06 DIAGNOSIS — R000 Tachycardia, unspecified: Secondary | ICD-10-CM | POA: Diagnosis not present

## 2023-12-06 DIAGNOSIS — Z79899 Other long term (current) drug therapy: Secondary | ICD-10-CM

## 2023-12-06 DIAGNOSIS — Z7901 Long term (current) use of anticoagulants: Secondary | ICD-10-CM

## 2023-12-06 DIAGNOSIS — Z87891 Personal history of nicotine dependence: Secondary | ICD-10-CM

## 2023-12-06 DIAGNOSIS — I69391 Dysphagia following cerebral infarction: Secondary | ICD-10-CM

## 2023-12-06 LAB — CBC WITH DIFFERENTIAL/PLATELET
Abs Immature Granulocytes: 0.04 10*3/uL (ref 0.00–0.07)
Basophils Absolute: 0 10*3/uL (ref 0.0–0.1)
Basophils Relative: 0 %
Eosinophils Absolute: 0.1 10*3/uL (ref 0.0–0.5)
Eosinophils Relative: 1 %
HCT: 32.7 % — ABNORMAL LOW (ref 36.0–46.0)
Hemoglobin: 10.7 g/dL — ABNORMAL LOW (ref 12.0–15.0)
Immature Granulocytes: 0 %
Lymphocytes Relative: 25 %
Lymphs Abs: 2.3 10*3/uL (ref 0.7–4.0)
MCH: 30.2 pg (ref 26.0–34.0)
MCHC: 32.7 g/dL (ref 30.0–36.0)
MCV: 92.4 fL (ref 80.0–100.0)
Monocytes Absolute: 1.2 10*3/uL — ABNORMAL HIGH (ref 0.1–1.0)
Monocytes Relative: 13 %
Neutro Abs: 5.4 10*3/uL (ref 1.7–7.7)
Neutrophils Relative %: 61 %
Platelets: 149 10*3/uL — ABNORMAL LOW (ref 150–400)
RBC: 3.54 MIL/uL — ABNORMAL LOW (ref 3.87–5.11)
RDW: 16 % — ABNORMAL HIGH (ref 11.5–15.5)
WBC: 9 10*3/uL (ref 4.0–10.5)
nRBC: 0 % (ref 0.0–0.2)

## 2023-12-06 LAB — URINALYSIS, W/ REFLEX TO CULTURE (INFECTION SUSPECTED)
Bacteria, UA: NONE SEEN
Bilirubin Urine: NEGATIVE
Glucose, UA: NEGATIVE mg/dL
Ketones, ur: NEGATIVE mg/dL
Leukocytes,Ua: NEGATIVE
Nitrite: NEGATIVE
Protein, ur: 30 mg/dL — AB
Specific Gravity, Urine: 1.01 (ref 1.005–1.030)
pH: 9 — ABNORMAL HIGH (ref 5.0–8.0)

## 2023-12-06 LAB — COMPREHENSIVE METABOLIC PANEL WITH GFR
ALT: 28 U/L (ref 0–44)
AST: 30 U/L (ref 15–41)
Albumin: 3.1 g/dL — ABNORMAL LOW (ref 3.5–5.0)
Alkaline Phosphatase: 159 U/L — ABNORMAL HIGH (ref 38–126)
Anion gap: 10 (ref 5–15)
BUN: 31 mg/dL — ABNORMAL HIGH (ref 8–23)
CO2: 29 mmol/L (ref 22–32)
Calcium: 9.2 mg/dL (ref 8.9–10.3)
Chloride: 91 mmol/L — ABNORMAL LOW (ref 98–111)
Creatinine, Ser: 3.91 mg/dL — ABNORMAL HIGH (ref 0.44–1.00)
GFR, Estimated: 11 mL/min — ABNORMAL LOW (ref >60)
Glucose, Bld: 236 mg/dL — ABNORMAL HIGH (ref 70–99)
Potassium: 5 mmol/L (ref 3.5–5.1)
Sodium: 130 mmol/L — ABNORMAL LOW (ref 135–145)
Total Bilirubin: 0.6 mg/dL (ref 0.0–1.2)
Total Protein: 7.1 g/dL (ref 6.5–8.1)

## 2023-12-06 LAB — RESP PANEL BY RT-PCR (RSV, FLU A&B, COVID)  RVPGX2
Influenza A by PCR: NEGATIVE
Influenza B by PCR: NEGATIVE
Resp Syncytial Virus by PCR: NEGATIVE
SARS Coronavirus 2 by RT PCR: NEGATIVE

## 2023-12-06 LAB — I-STAT CG4 LACTIC ACID, ED: Lactic Acid, Venous: 1.7 mmol/L (ref 0.5–1.9)

## 2023-12-06 LAB — CBG MONITORING, ED
Glucose-Capillary: 51 mg/dL — ABNORMAL LOW (ref 70–99)
Glucose-Capillary: 215 mg/dL — ABNORMAL HIGH (ref 70–99)

## 2023-12-06 LAB — PROTIME-INR
INR: 1.9 — ABNORMAL HIGH (ref 0.8–1.2)
Prothrombin Time: 21.6 s — ABNORMAL HIGH (ref 11.4–15.2)

## 2023-12-06 MED ORDER — ONDANSETRON HCL 4 MG/2ML IJ SOLN: 4.0000 mg | Freq: Three times a day (TID) | INTRAMUSCULAR | Status: DC | PRN

## 2023-12-06 MED ORDER — ATORVASTATIN CALCIUM 10 MG PO TABS
10.0000 mg | ORAL_TABLET | Freq: Every day | ORAL | Status: DC
  Administered 2023-12-06 – 2023-12-09 (×4): 10 mg
  Filled 2023-12-06 (×4): qty 1

## 2023-12-06 MED ORDER — DEXTROSE 50 % IV SOLN
25.0000 g | INTRAVENOUS | Status: AC
  Administered 2023-12-06: 25 g via INTRAVENOUS
  Filled 2023-12-06: qty 50

## 2023-12-06 MED ORDER — ONDANSETRON HCL 4 MG/2ML IJ SOLN
4.0000 mg | Freq: Once | INTRAMUSCULAR | Status: DC
  Filled 2023-12-06: qty 2

## 2023-12-06 MED ORDER — METRONIDAZOLE 500 MG/100ML IV SOLN
500.0000 mg | Freq: Once | INTRAVENOUS | Status: AC
  Administered 2023-12-06: 500 mg via INTRAVENOUS
  Filled 2023-12-06: qty 100

## 2023-12-06 MED ORDER — HEPARIN SODIUM (PORCINE) 1000 UNIT/ML DIALYSIS
2500.0000 [IU] | Freq: Once | INTRAMUSCULAR | Status: AC
  Filled 2023-12-06: qty 3

## 2023-12-06 MED ORDER — CHLORHEXIDINE GLUCONATE CLOTH 2 % EX PADS
6.0000 | MEDICATED_PAD | Freq: Every day | CUTANEOUS | Status: DC
  Administered 2023-12-07 – 2023-12-09 (×3): 6 via TOPICAL

## 2023-12-06 MED ORDER — HEPARIN SODIUM (PORCINE) 1000 UNIT/ML DIALYSIS: 1000.0000 [IU] | INTRAMUSCULAR | Status: DC | PRN

## 2023-12-06 MED ORDER — LACTATED RINGERS IV BOLUS
1000.0000 mL | Freq: Once | INTRAVENOUS | Status: AC
  Administered 2023-12-06: 1000 mL via INTRAVENOUS

## 2023-12-06 MED ORDER — ANTICOAGULANT SODIUM CITRATE 4% (200MG/5ML) IV SOLN
5.0000 mL | Status: DC | PRN
  Filled 2023-12-06: qty 5

## 2023-12-06 MED ORDER — LIDOCAINE HCL (PF) 1 % IJ SOLN: 5.0000 mL | INTRAMUSCULAR | Status: DC | PRN

## 2023-12-06 MED ORDER — ALTEPLASE 2 MG IJ SOLR: 2.0000 mg | Freq: Once | INTRAMUSCULAR | Status: DC | PRN

## 2023-12-06 MED ORDER — SODIUM CHLORIDE 0.9% FLUSH
3.0000 mL | Freq: Two times a day (BID) | INTRAVENOUS | Status: DC
  Administered 2023-12-06 – 2023-12-09 (×6): 3 mL via INTRAVENOUS

## 2023-12-06 MED ORDER — ACETAMINOPHEN 650 MG RE SUPP: 650.0000 mg | Freq: Four times a day (QID) | RECTAL | Status: DC | PRN

## 2023-12-06 MED ORDER — HEPARIN (PORCINE) 25000 UT/250ML-% IV SOLN
1100.0000 [IU]/h | INTRAVENOUS | Status: DC
  Administered 2023-12-06: 1000 [IU]/h via INTRAVENOUS

## 2023-12-06 MED ORDER — ANTICOAGULANT SODIUM CITRATE 4% (200MG/5ML) IV SOLN: 5.0000 mL | Status: DC | PRN

## 2023-12-06 MED ORDER — POLYETHYLENE GLYCOL 3350 17 G PO PACK
17.0000 g | PACK | Freq: Two times a day (BID) | ORAL | Status: DC
  Administered 2023-12-06 – 2023-12-09 (×6): 17 g
  Filled 2023-12-06 (×6): qty 1

## 2023-12-06 MED ORDER — HEPARIN (PORCINE) 25000 UT/250ML-% IV SOLN
1000.0000 [IU]/h | INTRAVENOUS | Status: DC
  Administered 2023-12-06: 1000 [IU]/h via INTRAVENOUS
  Filled 2023-12-06: qty 250

## 2023-12-06 MED ORDER — OSMOLITE 1.2 CAL PO LIQD
1000.0000 mL | ORAL | Status: DC
  Administered 2023-12-06: 1000 mL
  Filled 2023-12-06 (×2): qty 1000

## 2023-12-06 MED ORDER — LACTATED RINGERS IV SOLN: INTRAVENOUS | Status: DC

## 2023-12-06 MED ORDER — LIDOCAINE-PRILOCAINE 2.5-2.5 % EX CREA: 1.0000 | TOPICAL_CREAM | CUTANEOUS | Status: DC | PRN

## 2023-12-06 MED ORDER — HEPARIN SODIUM (PORCINE) 1000 UNIT/ML DIALYSIS
2500.0000 [IU] | Freq: Once | INTRAMUSCULAR | Status: AC
  Administered 2023-12-07: 2500 [IU] via INTRAVENOUS_CENTRAL
  Filled 2023-12-06 (×2): qty 3

## 2023-12-06 MED ORDER — METRONIDAZOLE 500 MG/100ML IV SOLN
500.0000 mg | Freq: Two times a day (BID) | INTRAVENOUS | Status: DC
  Administered 2023-12-07 – 2023-12-09 (×6): 500 mg via INTRAVENOUS
  Filled 2023-12-06 (×6): qty 100

## 2023-12-06 MED ORDER — NEPRO/CARBSTEADY PO LIQD
237.0000 mL | ORAL | Status: DC | PRN
  Filled 2023-12-06: qty 237

## 2023-12-06 MED ORDER — SODIUM CHLORIDE 0.9 % IV SOLN
2.0000 g | INTRAVENOUS | Status: DC
  Administered 2023-12-07 – 2023-12-09 (×3): 2 g via INTRAVENOUS
  Filled 2023-12-06 (×3): qty 20

## 2023-12-06 MED ORDER — HEPARIN SODIUM (PORCINE) 1000 UNIT/ML DIALYSIS: 1500.0000 [IU] | INTRAMUSCULAR | Status: DC | PRN

## 2023-12-06 MED ORDER — SODIUM CHLORIDE 0.9 % IV SOLN
2.0000 g | Freq: Once | INTRAVENOUS | Status: AC
  Administered 2023-12-06: 2 g via INTRAVENOUS
  Filled 2023-12-06: qty 20

## 2023-12-06 MED ORDER — INSULIN ASPART 100 UNIT/ML IJ SOLN
0.0000 [IU] | INTRAMUSCULAR | Status: DC
  Administered 2023-12-08 (×2): 3 [IU] via SUBCUTANEOUS

## 2023-12-06 MED ORDER — IOHEXOL 350 MG/ML SOLN
75.0000 mL | Freq: Once | INTRAVENOUS | Status: AC | PRN
  Administered 2023-12-06: 75 mL via INTRAVENOUS

## 2023-12-06 MED ORDER — ENOXAPARIN SODIUM 60 MG/0.6ML IJ SOSY: 60.0000 mg | PREFILLED_SYRINGE | Freq: Once | INTRAMUSCULAR | Status: DC

## 2023-12-06 MED ORDER — INSULIN GLARGINE-YFGN 100 UNIT/ML ~~LOC~~ SOLN
5.0000 [IU] | Freq: Every day | SUBCUTANEOUS | Status: DC
  Administered 2023-12-06 – 2023-12-07 (×2): 5 [IU] via SUBCUTANEOUS
  Filled 2023-12-06 (×3): qty 0.05

## 2023-12-06 MED ORDER — ACETAMINOPHEN 325 MG PO TABS: 650.0000 mg | ORAL_TABLET | Freq: Four times a day (QID) | ORAL | Status: DC | PRN

## 2023-12-06 MED ORDER — PENTAFLUOROPROP-TETRAFLUOROETH EX AERO: 1.0000 | INHALATION_SPRAY | CUTANEOUS | Status: DC | PRN

## 2023-12-06 NOTE — Sepsis Progress Note (Signed)
 Elink monitoring for the code sepsis protocol. Samantha [name] a

## 2023-12-06 NOTE — ED Notes (Signed)
 Second assess IV team consulted due to RN attempting x2 without success. Current IV infiltrated, LR stopped, and heparin drip still needs to be initiated.

## 2023-12-06 NOTE — ED Provider Notes (Signed)
 [place] EMERGENCY DEPARTMENT AT [facility] Provider Note   CSN: [id number] Arrival date & time: 12/06/23  5784     History Chief Complaint  Patient presents with   Fever    Samantha [name] is a 78 y.o. female.  Patient with history significant for ESRD on hemodialysis, prior DVTs, dementia due to Alzheimer's disease, prior CVAs, pancytopenia presents emergency department with concerns of fever and vomiting.  She reports that she began experiencing vomiting yesterday evening and had a reported temperature maximum of 100.7 F at home although patient when asked states that she did not measure her temperature at home.  Patient is AO x 3 which is her baseline.  Patient is a current dialysis patient on a Monday, Wednesday, Friday schedule.  No recent sick contacts or exposures.  Denies any diarrhea.  Was recently started on antibiotics for a labial abscess that is actively draining on its own.   Fever      Home Medications Prior to Admission medications   Medication Sig Start Date End Date Taking? Authorizing Provider  acetaminophen (TYLENOL) 500 MG tablet Take 500 mg by mouth daily as needed for mild pain (pain score 1-3).   Yes [provider]  apixaban (ELIQUIS) 2.5 MG TABS tablet Take 1 tablet (2.5 mg total) by mouth 2 (two) times daily. 11/24/23  Yes Veludandi, Prashanthi, MD  atorvastatin (LIPITOR) 10 MG tablet Take 1 tablet (10 mg total) by mouth daily. Patient taking differently: Take 10 mg by mouth at bedtime. 10/19/23  Yes Tye Gall, MD  carvedilol (COREG) 3.125 MG tablet Take 3.125 mg Q AM  and 3.125 mg Q PM on non dialysis days (Tues, Thurs, Sat and Sun,  - On dialysis days,  MWF, continue Coreg 3.125 mg Q PM -  Hold for SBP < 100 Patient taking differently: Take 3.125 mg by mouth See admin instructions. Take 3.125 mg by mouth twice daily on non dialysis days. Take 3.125 mg by mouth only in the evening on dialysis days. 11/24/23  Yes Veludandi,  Prashanthi, MD  doxycycline (VIBRA-TABS) 100 MG tablet Take 1 tablet (100 mg total) by mouth 2 (two) times daily for 10 days. 12/05/23 12/15/23 Yes Blair, Diane W, FNP  Glucagon (BAQSIMI ONE PACK) 3 MG/DOSE POWD Place 1 packet into the nose as needed (For blood sugar). Use when blood sugar drops below 70.   Yes [provider]  insulin aspart (NOVOLOG) 100 UNIT/ML injection 0-9 Units, Subcutaneous, 3 times daily with meals, CBG < 70: Implement Hypoglycemia measures CBG 70 - 120: 0 units CBG 121 - 150: 1 unit CBG 151 - 200: 2 units CBG 201 - 250: 3 units CBG 251 - 300: 5 units CBG 301 - 350: 7 units CBG 351 - 400: 9 units CBG > 400: call MD Patient taking differently: Inject 0-10 Units into the skin in the morning, at noon, and at bedtime. BS <70  inject 0 units  BS 70-90 inject 1 unit  BS 91-130 inject 2 units  BS 131-150 inject 3 units  BS 151-200 inject 4 units  BS 201-250 inject 5 units  BS 251-300 inject 6 units  BS 301-350 inject 7 units  BS 351-400 inject 8 units  BS 401-450 inject 9 units  BS > 450 inject 10 units 12/25/22  Yes Ghimire, Estil Heman, MD  insulin glargine (LANTUS) 100 UNIT/ML Solostar Pen Inject 15 Units into the skin at bedtime.   Yes [provider]  Multiple Vitamins-Minerals (MULTIVITAMIN WITH MINERALS)  tablet Take 1 tablet by mouth at bedtime.   Yes [provider]  Nutritional Supplements (FEEDING SUPPLEMENT, OSMOLITE 1.5 CAL,) LIQD Place 600 mLs into feeding tube See admin instructions. 8p-6a   Yes [provider]  senna (SENOKOT) 8.6 MG TABS tablet Take 1 tablet (8.6 mg total) by mouth daily. Patient taking differently: Take 1 tablet by mouth in the morning and at bedtime. 09/01/23  Yes Carin Charleston, MD  promethazine (PHENERGAN) 25 MG tablet Take 0.5 tablets (12.5 mg total) by mouth every 6 (six) hours as needed for nausea or vomiting. 09/01/23   Carin Charleston, MD      Allergies    Asa [aspirin] and Penicillins    Review of Systems    Review of Systems  Constitutional:  Positive for fever.  All other systems reviewed and are negative.   Physical Exam Updated Vital Signs BP (!) 143/81   Pulse (!) 106   Temp 98.7 F (37.1 C) (Oral)   Resp 16   Ht 4\' 11"  (1.499 m)   Wt 60.8 kg   SpO2 100%   BMI 27.07 kg/m  Physical Exam Vitals and nursing note reviewed.  Constitutional:      General: She is not in acute distress.    Appearance: She is well-developed.  HENT:     Head: Normocephalic and atraumatic.  Eyes:     Conjunctiva/sclera: Conjunctivae normal.  Cardiovascular:     Rate and Rhythm: Normal rate and regular rhythm.     Heart sounds: No murmur heard. Pulmonary:     Effort: Pulmonary effort is normal. No respiratory distress.     Breath sounds: Normal breath sounds.  Abdominal:     Palpations: Abdomen is soft.     Tenderness: There is no abdominal tenderness.  Genitourinary:    Comments: Right sided labial abscess,  Musculoskeletal:        General: No swelling.     Cervical back: Neck supple.  Skin:    General: Skin is warm and dry.     Capillary Refill: Capillary refill takes less than 2 seconds.  Neurological:     Mental Status: She is alert.  Psychiatric:        Mood and Affect: Mood normal.     ED Results / Procedures / Treatments   Labs (all labs ordered are listed, but only abnormal results are displayed) Labs Reviewed  URINALYSIS, W/ REFLEX TO CULTURE (INFECTION SUSPECTED) - Abnormal; Notable for the following components:      Result Value   pH 9.0 (*)    Hgb urine dipstick SMALL (*)    Protein, ur 30 (*)    All other components within normal limits  CBC WITH DIFFERENTIAL/PLATELET - Abnormal; Notable for the following components:   RBC 3.54 (*)    Hemoglobin 10.7 (*)    HCT 32.7 (*)    RDW 16.0 (*)    Platelets 149 (*)    Monocytes Absolute 1.2 (*)    All other components within normal limits  COMPREHENSIVE METABOLIC PANEL WITH GFR - Abnormal; Notable for the following  components:   Sodium 130 (*)    Chloride 91 (*)    Glucose, Bld 236 (*)    BUN 31 (*)    Creatinine, Ser 3.91 (*)    Albumin 3.1 (*)    Alkaline Phosphatase 159 (*)    GFR, Estimated 11 (*)    All other components within normal limits  PROTIME-INR - Abnormal; Notable for the  following components:   Prothrombin Time 21.6 (*)    INR 1.9 (*)    All other components within normal limits  HEPARIN LEVEL (UNFRACTIONATED) - Abnormal; Notable for the following components:   Heparin Unfractionated >1.10 (*)    All other components within normal limits  APTT - Abnormal; Notable for the following components:   aPTT 64 (*)    All other components within normal limits  CBC - Abnormal; Notable for the following components:   RBC 3.39 (*)    Hemoglobin 10.0 (*)    HCT 31.1 (*)    RDW 16.1 (*)    Platelets 121 (*)    All other components within normal limits  RENAL FUNCTION PANEL - Abnormal; Notable for the following components:   Potassium 5.5 (*)    Chloride 97 (*)    Glucose, Bld 114 (*)    BUN 31 (*)    Creatinine, Ser 3.92 (*)    Albumin 3.1 (*)    GFR, Estimated 11 (*)    All other components within normal limits  GLUCOSE, CAPILLARY - Abnormal; Notable for the following components:   Glucose-Capillary 115 (*)    All other components within normal limits  CBG MONITORING, ED - Abnormal; Notable for the following components:   Glucose-Capillary 51 (*)    All other components within normal limits  CBG MONITORING, ED - Abnormal; Notable for the following components:   Glucose-Capillary 215 (*)    All other components within normal limits  RESP PANEL BY RT-PCR (RSV, FLU A&B, COVID)  RVPGX2  CULTURE, BLOOD (ROUTINE X 2)  CULTURE, BLOOD (ROUTINE X 2)  PHOSPHORUS  CBC WITH DIFFERENTIAL/PLATELET  HEPATITIS B SURFACE ANTIGEN  HEPATITIS B SURFACE ANTIBODY, QUANTITATIVE  COMPREHENSIVE METABOLIC PANEL WITH GFR  APTT  I-STAT CG4 LACTIC ACID, ED  CBG MONITORING, ED    EKG EKG  Interpretation Date/Time:  Monday December 06 2023 07:22:50 EDT Ventricular Rate:  107 PR Interval:  170 QRS Duration:  96 QT Interval:  352 QTC Calculation: 470 R Axis:   -80  Text Interpretation: Sinus tachycardia L Confirmed by Lowery Rue [phone]) on 12/06/2023 7:41:44 AM  Radiology CT ABDOMEN PELVIS W CONTRAST Result Date: 12/06/2023 CLINICAL DATA:  Abdominal pain, acute, nonlocalized Sepsis EXAM: CT ABDOMEN AND PELVIS WITH CONTRAST TECHNIQUE: Multidetector CT imaging of the abdomen and pelvis was performed using the standard protocol following bolus administration of intravenous contrast. RADIATION DOSE REDUCTION: This exam was performed according to the departmental dose-optimization program which includes automated exposure control, adjustment of the mA and/or kV according to patient size and/or use of iterative reconstruction technique. CONTRAST:  75mL OMNIPAQUE IOHEXOL 350 MG/ML SOLN COMPARISON:  Noncontrast abdominopelvic CT 09/25/2023 and 09/01/2023. FINDINGS: Lower chest: New small to moderate dependent right pleural effusion with increased right basilar atelectasis. Atelectasis at the left lung base has not significantly changed. Unchanged position of the hemodialysis catheter, near the tricuspid valve. Stable cardiomegaly. Hepatobiliary: The liver is normal in density without suspicious focal abnormality. Cholecystostomy tube remains in place with stable decompression of the gallbladder. Persistent gallbladder wall thickening and small calcified gallstones. No significant biliary dilatation. Pancreas: Unremarkable. No pancreatic ductal dilatation or surrounding inflammatory changes. Spleen: Normal in size without focal abnormality. Adrenals/Urinary Tract: Both adrenal glands appear normal. No evidence of urinary tract calculus, suspicious renal lesion or hydronephrosis. Mild renal cortical thinning and small renal cysts bilaterally for which no specific follow-up imaging is recommended. The  bladder appears unremarkable for its degree of distention. Stomach/Bowel:  No enteric contrast administered. Percutaneous gastrostomy tube remains in place. A large amount of stool remains within the rectum with stable mild rectal wall thickening and perirectal soft tissue stranding. No other bowel distension, wall thickening or surrounding inflammation identified. The appendix appears normal. Vascular/Lymphatic: There are no enlarged abdominal or pelvic lymph nodes. Infrarenal IVC filter remains in place. There is asymmetric decreased opacification of the left iliac veins without obvious DVT. Aortic and branch vessel atherosclerosis without evidence of aneurysm or large vessel occlusion. Reproductive: Status post hysterectomy.  No adnexal mass. Other: Increased edema in the right lateral chest wall adjacent to the cholecystostomy tube. No focal fluid collection identified. No ascites or pneumoperitoneum. Musculoskeletal: No acute or significant osseous findings. Mild spondylosis. IMPRESSION: 1. New small to moderate dependent right pleural effusion with increased right basilar atelectasis. 2. Stable position of the cholecystostomy tube with stable decompression of the gallbladder. Persistent gallbladder wall thickening and small calcified gallstones. 3. Increased edema in the right lateral chest wall adjacent to the cholecystostomy tube. No focal fluid collection identified. 4. Stable large amount of stool within the rectum with stable mild rectal wall thickening and perirectal soft tissue stranding, suspicious for stercoral colitis. 5. Asymmetric decreased opacification of the left iliac veins without obvious DVT. IVC filter in place. 6.  Aortic Atherosclerosis (ICD10-I70.0). Electronically Signed   By: Carey Bullocks M.D.   On: 12/06/2023 10:54   DG Chest Port 1 View Result Date: 12/06/2023 CLINICAL DATA:  Questionable sepsis EXAM: PORTABLE CHEST 1 VIEW COMPARISON:  06/16/2023 FINDINGS: Chronic cardiomegaly.  Dialysis catheter with tip the lower right atrium, unchanged. Low volume chest with indistinct density at the right base that is stable and likely scarring. No edema, effusion, or pneumothorax. Catheter over the right upper quadrant, cholecystostomy tube by prior reports IMPRESSION: Stable chest including dialysis catheter with tip at the lower right atrium. Electronically Signed   By: Tiburcio Pea M.D.   On: 12/06/2023 07:23    Procedures Procedures    Medications Ordered in ED Medications  ondansetron (ZOFRAN) injection 4 mg (4 mg Intravenous Patient Refused/Not Given 12/06/23 0800)  atorvastatin (LIPITOR) tablet 10 mg (10 mg Per Tube Given 12/06/23 2300)  sodium chloride flush (NS) 0.9 % injection 3 mL (3 mLs Intravenous Given 12/06/23 2230)  acetaminophen (TYLENOL) tablet 650 mg (has no administration in time range)    Or  acetaminophen (TYLENOL) suppository 650 mg (has no administration in time range)  polyethylene glycol (MIRALAX / GLYCOLAX) packet 17 g (17 g Per Tube Given 12/06/23 2300)  cefTRIAXone (ROCEPHIN) 2 g in sodium chloride 0.9 % 100 mL IVPB (has no administration in time range)  metroNIDAZOLE (FLAGYL) IVPB 500 mg (0 mg Intravenous Stopped 12/07/23 0216)  ondansetron (ZOFRAN) injection 4 mg (has no administration in time range)  insulin glargine-yfgn (SEMGLEE) injection 5 Units (5 Units Subcutaneous Given 12/06/23 2300)  insulin aspart (novoLOG) injection 0-6 Units ( Subcutaneous Not Given 12/07/23 0400)  Chlorhexidine Gluconate Cloth 2 % PADS 6 each (0 each Topical Duplicate 12/07/23 0600)  pentafluoroprop-tetrafluoroeth (GEBAUERS) aerosol 1 Application (has no administration in time range)  lidocaine (PF) (XYLOCAINE) 1 % injection 5 mL (has no administration in time range)  lidocaine-prilocaine (EMLA) cream 1 Application (has no administration in time range)  feeding supplement (NEPRO CARB STEADY) liquid 237 mL (has no administration in time range)  heparin injection 1,000  Units (has no administration in time range)  anticoagulant sodium citrate solution 5 mL (has no administration in time range)  alteplase (  CATHFLO ACTIVASE) injection 2 mg (has no administration in time range)  heparin injection 1,500 Units (has no administration in time range)  heparin injection 2,500 Units (has no administration in time range)  heparin ADULT infusion 100 units/mL (25000 units/250mL) (1,100 Units/hr Intravenous Infusion Verify 12/07/23 0556)  feeding supplement (OSMOLITE 1.2 CAL) liquid 1,000 mL (1,000 mLs Per Tube Given 12/06/23 2230)  heparin injection 1,000 Units (has no administration in time range)  heparin injection 2,500 Units (has no administration in time range)  cefTRIAXone (ROCEPHIN) 2 g in sodium chloride 0.9 % 100 mL IVPB (0 g Intravenous Stopped 12/06/23 0903)  lactated ringers bolus 1,000 mL (0 mLs Intravenous Stopped 12/06/23 1055)  iohexol (OMNIPAQUE) 350 MG/ML injection 75 mL (75 mLs Intravenous Contrast Given 12/06/23 1013)  metroNIDAZOLE (FLAGYL) IVPB 500 mg (0 mg Intravenous Stopped 12/06/23 1311)  dextrose 50 % solution 25 g (25 g Intravenous Given 12/06/23 2029)    ED Course/ Medical Decision Making/ A&P                                 Medical Decision Making Amount and/or Complexity of Data Reviewed Radiology: ordered.  Risk Prescription drug management. Decision regarding hospitalization.   This patient presents to the ED for concern of vomiting, abdominal pain.  Differential diagnosis includes sepsis, bowel obstruction, UTI, pyelonephritis, cellulitis   Lab Tests:  I Ordered, and personally interpreted labs.  The pertinent results include: Lactic acid normal at 1.7, CBC unremarkable, UA without signs of infection, respiratory panel negative  Imaging Studies ordered:  I ordered imaging studies including Chest x-ray, CT abdomen pelvis  I independently visualized and interpreted imaging which showed no acute findings on chest x-ray,1. New small  to moderate dependent right pleural effusion with increased right basilar atelectasis. 2. Stable position of the cholecystostomy tube with stable decompression of the gallbladder. Persistent gallbladder wall thickening and small calcified gallstones. 3. Increased edema in the right lateral chest wall adjacent to the cholecystostomy tube. No focal fluid collection identified. 4. Stable large amount of stool within the rectum with stable mild rectal wall thickening and perirectal soft tissue stranding, suspicious for stercoral colitis. 5. Asymmetric decreased opacification of the left iliac veins without obvious DVT. IVC filter in place. 6.  Aortic Atherosclerosis (ICD10-I70.0). I agree with the radiologist interpretation   Medicines ordered and prescription drug management:  I ordered medication including LR, Zofran, Rocephin, metronidazole for sepsis, nausea Reevaluation of the patient after these medicines showed that the patient improved I have reviewed the patients home medicines and have made adjustments as needed   Problem List / ED Course:  Patient with past history significant for ESRD on hemodialysis, prior DVTs, dementia due to Alzheimer disease, prior CVAs, pancytopenia presents to ED with concerns of fever and vomiting.  She reports he began to experience vomiting and fever starting yesterday evening with reported temperature max of 100.7 F at home although no reported temperatures.  EMS reportedly had a temperature of 99.1 F.  She reports recently started on doxycycline for a labial abscess that she has no concerns about.  No recent sick contacts or exposures. Physical exam reveals no abnormal heart or lung sounds.  Borderline tachycardia.  Pelvic examination performed with chaperone present which does reveal a right-sided labial abscess that is actively draining.  No significant induration or erythema.  No focal abdominal tenderness.  Will proceed with sepsis labs due to patient's high  risk  comorbidities and reported fever at home.  Patient acutely meeting SIRS criteria on arrival as patient is afebrile, normotensive, and is not currently tachycardic or tachypneic.  LR infusion ordered as well as Zofran. On reassessment, patient's hypotension appears improved after initiation of small fluid bolus.  Given patient's history of ESRD and CHF, hold off on aggressive fluid hydration given normal lactic acid. CT imaging is concerning for findings of a possible small moderate right pleural effusion as well as a large stool burden in the rectum concern for possible stercoral colitis.  In the setting of the patient presenting with concerns of fever, tachycardia, will plan on admission. Spoke with Dr. Rufina Cough, hospitalist, will be admitting patient.  Final Clinical Impression(s) / ED Diagnoses Final diagnoses:  Stercoral colitis  Labial abscess    Rx / DC Orders ED Discharge Orders     None         Concetta Dee, PA-C 12/07/23 [id number]    Lowery Rue, DO 12/09/23 2136

## 2023-12-06 NOTE — ED Provider Triage Note (Signed)
 Emergency Medicine Provider Triage Evaluation Note  Samantha [name] , a 78 y.o. female  was evaluated in triage.  Pt complains of fever and vomiting   Review of Systems  Positive: Fever  Negative: Rash   Physical Exam  BP 116/62 (BP Location: Right Arm)   Pulse 99   Temp 99.1 F (37.3 C) (Oral)   Resp 17   SpO2 100%  Gen:   Awake, no distress   Resp:  Normal effort  MSK:   Moves extremities without difficulty  Other:  NCAT Medical Decision Making  Medically screening exam initiated at 6:49 AM.  Appropriate orders placed.  Samantha [name] was informed that the remainder of the evaluation will be completed by another provider, this initial triage assessment does not replace that evaluation, and the importance of remaining in the ED until their evaluation is complete.     Samantha Ivins, MD 12/06/23 [phone]

## 2023-12-06 NOTE — Sepsis Progress Note (Signed)
Notified provider of need to order antibiotics.   

## 2023-12-06 NOTE — ED Notes (Signed)
 Patient transported to CT

## 2023-12-06 NOTE — Sepsis Progress Note (Signed)
 Notified bedside nurse of need to administer antibiotics.

## 2023-12-06 NOTE — ED Triage Notes (Signed)
 BIB EMS from home for fever and vomiting starting this evening. Tmax 100.80f at home. Pt at baseline mental status (alert and oriented x3) and is a dialysis patient.   BP 136/88  HR 108  RR 18 O2 97% RA BG 361

## 2023-12-06 NOTE — Progress Notes (Addendum)
 ANTICOAGULATION CONSULT NOTE  Pharmacy Consult for Heparin Indication:  HX of DVT holding eliquis  Allergies  Allergen Reactions   Asa [Aspirin] Anaphylaxis   Penicillins Anaphylaxis    **Tolerates cephalosporins    Patient Measurements:   Heparin Dosing Weight: 55 kg  Vital Signs: Temp: 98.2 F (36.8 C) (04/14 0933) Temp Source: Oral (04/14 0933) BP: 108/64 (04/14 1300) Pulse Rate: 86 (04/14 1300)  Labs: Recent Labs    12/06/23 0701 12/06/23 0730  HGB 10.7*  --   HCT 32.7*  --   PLT 149*  --   LABPROT  --  21.6*  INR  --  1.9*  CREATININE  --  3.91*    CrCl cannot be calculated (Unknown ideal weight.).   Medical History: Past Medical History:  Diagnosis Date   Cerebrovascular accident (CVA) due to occlusion of cerebral artery (HCC) 07/12/2019   CVA (cerebral vascular accident) (HCC)    residual left sided weakness   Dementia (HCC)    Diabetes (HCC)    Diabetes mellitus without complication (HCC)    ESRD (end stage renal disease) on dialysis (HCC)    Heart failure (HCC)    History of CT scan    History of heart attack    History of MRI    History of stroke    HTN (hypertension)    Kidney failure    PEG (percutaneous endoscopic gastrostomy) status (HCC)    Sepsis (HCC) 04/01/2023   SIRS (systemic inflammatory response syndrome) (HCC) 04/06/2023   Thyroid disease     Medications:  (Not in a hospital admission)  Scheduled:   atorvastatin  10 mg Per Tube QHS   ondansetron (ZOFRAN) IV  4 mg Intravenous Once   polyethylene glycol  17 g Per Tube BID   sodium chloride flush  3 mL Intravenous Q12H   Infusions:   [START ON 12/07/2023] cefTRIAXone (ROCEPHIN)  IV     lactated ringers 150 mL/hr at 12/06/23 0935   metronidazole     PRN: acetaminophen **OR** acetaminophen  Assessment: 89 yof with a history of HTN, DM, neuropathy, CVA, PEG tube, cholecystostomy, HF, DVT on eliquis, ESRD, pancytopenia, dementia. Patient is presenting with fever. Heparin  per pharmacy consult placed for   HX of DVT holding eliquis in case surgical intervention is needed .  Patient is on apixaban prior to arrival. Last dose 4/13 at 2000. Will require aPTT monitoring due to likely falsely high anti-Xa level secondary to DOAC use.  Hgb 10.7; plt 149 PT/INR 21.6/1.9  Goal of Therapy:  Heparin level 0.3-0.7 units/ml aPTT 66-102 seconds Monitor platelets by anticoagulation protocol: Yes   Plan:  No initial heparin bolus Start heparin infusion at 1000 units/hr Check aPTT & anti-Xa level in 8 hours and daily while on heparin Continue to monitor via aPTT until levels are correlated Continue to monitor H&H and platelets  Dionicio Fray, PharmD, BCPS 12/06/2023 1:19 PM ED Clinical Pharmacist -  [phone]

## 2023-12-06 NOTE — Consult Note (Signed)
 Renal Service Consult Note [facility]  Samantha [name] 12/06/2023 Samantha Krabbe, Samantha [name] Requesting Physician: Samantha [name]  Reason for Consult: ESRD pt w/ fevers HPI: The patient is a 78 y.o. year-old w/ PMH as below who presented to ED from home w/ new onset fevers. Has had some N/V at home x 1-2 days. HD MWF, has not missed. Recently started on abx for labial abscess. In ED HR 100s, BP 80s-140s, afebrile. Na 130, bun 31, creatinine 3.9, alb 3.1. Hb 10.7. LA wnl. UA, bcx's pending. Flu/ covid/ RSV pending. CT abd showed persistent GB wall thickening and chole tube w/ decompressed GB. Large amt of stool in rectum, +stranding c/w stercoral colitis. Pt rec'd IV rocephin, flagyl, IVF"s and zofran. Pt is admitted. We are asked to see for dialysis.    Pt seen in ED room. Pt is very lethargic and poor historian. No hx obtained.    ROS - n/a   PMH: HTN DM2 H/o CVA H/o PEG tube H/o DVT ESRD on HD Dementia Pancytopenia   Past Surgical History  Past Surgical History:  Procedure Laterality Date   ABDOMINAL HYSTERECTOMY     GALLBLADDER SURGERY     IR EXCHANGE BILIARY DRAIN  02/11/2023   IR EXCHANGE BILIARY DRAIN  04/13/2023   IR EXCHANGE BILIARY DRAIN  06/15/2023   IR EXCHANGE BILIARY DRAIN  08/23/2023   IR EXCHANGE BILIARY DRAIN  10/05/2023   IR EXCHANGE BILIARY DRAIN  11/30/2023   IR PERC CHOLECYSTOSTOMY  12/16/2022   IR REPLACE G-TUBE SIMPLE WO FLUORO  08/23/2023   IR REPLC GASTRO/COLONIC TUBE PERCUT W/FLUORO  06/15/2023   IR REPLC GASTRO/COLONIC TUBE PERCUT W/FLUORO  10/05/2023   IR REPLC GASTRO/COLONIC TUBE PERCUT W/FLUORO  11/30/2023   Family History  Family History  Problem Relation Age of Onset   Diabetes Mother    Social History  reports that she has never smoked. She does not have any smokeless tobacco history on file. She reports that she does not currently use alcohol. She reports that she does not use drugs. Allergies  Allergies  Allergen Reactions   Asa  [Aspirin] Anaphylaxis   Penicillins Anaphylaxis    **Tolerates cephalosporins   Home medications Prior to Admission medications   Medication Sig Start Date End Date Taking? Authorizing Provider  acetaminophen (TYLENOL) 500 MG tablet Take 500 mg by mouth daily as needed for mild pain (pain score 1-3).   Yes Provider, Historical, Samantha [name]  apixaban (ELIQUIS) 2.5 MG TABS tablet Take 1 tablet (2.5 mg total) by mouth 2 (two) times daily. 11/24/23  Yes Samantha Sheffield, Samantha [name]  atorvastatin (LIPITOR) 10 MG tablet Take 1 tablet (10 mg total) by mouth daily. Patient taking differently: Take 10 mg by mouth at bedtime. 10/19/23  Yes Samantha Sheffield, Samantha [name]  carvedilol (COREG) 3.125 MG tablet Take 3.125 mg Q AM  and 3.125 mg Q PM on non dialysis days (Tues, Thurs, Sat and Sun,  - On dialysis days,  MWF, continue Coreg 3.125 mg Q PM -  Hold for SBP < 100 Patient taking differently: Take 3.125 mg by mouth See admin instructions. Take 3.125 mg by mouth twice daily on non dialysis days. Take 3.125 mg by mouth only in the evening on dialysis days. 11/24/23  Yes Samantha Sheffield, Samantha [name]  doxycycline (VIBRA-TABS) 100 MG tablet Take 1 tablet (100 mg total) by mouth 2 (two) times daily for 10 days. 12/05/23 12/15/23 Yes Samantha Lek, Samantha [name]  Glucagon (BAQSIMI ONE PACK) 3 MG/DOSE POWD Place 1  packet into the nose as needed (For blood sugar). Use when blood sugar drops below 70.   Yes Provider, Historical, Samantha [name]  insulin aspart (NOVOLOG) 100 UNIT/ML injection 0-9 Units, Subcutaneous, 3 times daily with meals, CBG < 70: Implement Hypoglycemia measures CBG 70 - 120: 0 units CBG 121 - 150: 1 unit CBG 151 - 200: 2 units CBG 201 - 250: 3 units CBG 251 - 300: 5 units CBG 301 - 350: 7 units CBG 351 - 400: 9 units CBG > 400: call Samantha [name] Patient taking differently: Inject 0-10 Units into the skin in the morning, at noon, and at bedtime. BS <70  inject 0 units  BS 70-90 inject 1 unit  BS 91-130 inject 2 units  BS 131-150 inject 3 units  BS  151-200 inject 4 units  BS 201-250 inject 5 units  BS 251-300 inject 6 units  BS 301-350 inject 7 units  BS 351-400 inject 8 units  BS 401-450 inject 9 units  BS > 450 inject 10 units 12/25/22  Yes Ghimire, Samantha Lean, Samantha [name]  insulin glargine (LANTUS) 100 UNIT/ML Solostar Pen Inject 15 Units into the skin at bedtime.   Yes Provider, Historical, Samantha [name]  Multiple Vitamins-Minerals (MULTIVITAMIN WITH MINERALS) tablet Take 1 tablet by mouth at bedtime.   Yes Provider, Historical, Samantha [name]  Nutritional Supplements (FEEDING SUPPLEMENT, OSMOLITE 1.5 CAL,) LIQD Place 600 mLs into feeding tube See admin instructions. 8p-6a   Yes Provider, Historical, Samantha [name]  senna (SENOKOT) 8.6 MG TABS tablet Take 1 tablet (8.6 mg total) by mouth daily. Patient taking differently: Take 1 tablet by mouth in the morning and at bedtime. 09/01/23  Yes Samantha Glaze, Samantha [name]  promethazine (PHENERGAN) 25 MG tablet Take 0.5 tablets (12.5 mg total) by mouth every 6 (six) hours as needed for nausea or vomiting. 09/01/23   Samantha Glaze, Samantha [name]     Vitals:   12/06/23 1230 12/06/23 1300 12/06/23 1319 12/06/23 1324  BP: 129/67 108/64    Pulse: 87 86    Resp: 16 18    Temp:    98.4 F (36.9 C)  TempSrc:      SpO2: 100% 100%    Weight:   57 kg   Height:   4\' 11"  (1.499 m)    Exam Gen pt is quite lethargic, awakens to loud voice Interacts minimally then falls asleep On RA No rash, cyanosis or gangrene Sclera anicteric, throat clear  No jvd or bruits Chest clear bilat to bases, no rales/ wheezing RRR no MRG Abd soft ntnd no mass or ascites +bs GU defer MS no joint effusions or deformity Ext no pitting LE or UE edema, no other edema Neuro is as above  RIJ TDC intact      Renal-related home meds: Coreg 3.125 mg as listed Others: senna, phenergan, MVI, lantus insulin/ aspart, lipitor, eliquis    OP HD: MWF SW 3h  B350   57.2kg   2K bath  RIJ TDC  Heparin 2500 + 1500 midrun Last HD 4/11, post HD wt 58.0kg Mircera 50 mcg q 2 wks  IV   Assessment/ Plan: Fever/ stercoral colitis: CT w/ stranding around the rectum. Getting IV abx per primary team, and gentle IVF's.  Labial abscesses: draining in ED per notes. Per pmd.  ESRD: on HD MWF. No acute emergent indication. Will put on list for tonight but may be tomorrow which is okay.  HTN: BP's wnl to low. Holding home coreg. Follow.  Volume: close to dry wt after  last HD on 4/11. No vol ^ on exam, on room air. Small UF next HD.  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  Secondary hyperparathyroidism: CCa in range, add on phos. Cont binders if/when eating.  H/o DVT: eliquis --> change to IV heparin per primary team      Larry Poag  Samantha [name] CKA 12/06/2023, 4:23 PM  Recent Labs  Lab 12/06/23 0701 12/06/23 0730  HGB 10.7*  --   ALBUMIN  --  3.1*  CALCIUM  --  9.2  CREATININE  --  3.91*  K  --  5.0   Inpatient medications:  atorvastatin  10 mg Per Tube QHS   insulin aspart  0-6 Units Subcutaneous Q4H   insulin glargine-yfgn  5 Units Subcutaneous QHS   ondansetron (ZOFRAN) IV  4 mg Intravenous Once   polyethylene glycol  17 g Per Tube BID   sodium chloride flush  3 mL Intravenous Q12H    [START ON 12/07/2023] cefTRIAXone (ROCEPHIN)  IV     heparin 1,000 Units/hr (12/06/23 1535)   lactated ringers Stopped (12/06/23 1540)   metronidazole     acetaminophen **OR** acetaminophen, ondansetron (ZOFRAN) IV

## 2023-12-06 NOTE — H&P (Addendum)
 History and Physical   Samantha [name] ZOX:[id number] DOB: 25-Jul-1946 DOA: 12/06/2023  PCP: [name], MD   Patient coming from: Home  Chief Complaint: Fever  HPI: Samantha [name] is a 78 y.o. female with medical history significant of hypertension, diabetes, neuropathy, CVA, PEG tube, cholecystostomy tube, CHF, DVT, ESRD, pancytopenia, dementia presenting with fever from home.  History obtained assistance of chart review and family.  Patient reportedly had some nausea and vomiting for the past day or 2.  Subsequently had a fever at home.  At least at 1 point it was reported to be 100.7 but will need to clarify as she is subsequently stated she did not measure it.  She remains at her baseline which is alert and oriented x 3.  She is on dialysis MWF with last session on Friday.  She was recently started on antibiotics for a labial abscess which is actively draining.  Started on doxycycline.  She denies chills, chest pain, shortness of breath  ED Course: Vital signs in the ED notable for heart rate in the 100s, blood pressure in the 80s-140s systolic.  Afebrile in the ED.  Lab workup included CMP with sodium 130 which improved considering glucose of 236, chloride 91, BUN 31, creatinine 3.9, albumin 3.1, alk phos 159.  CBC with hemoglobin stable 10.7 and platelets 149.  PT 21.6 and INR 1.9.  Lactic acid normal.  Repeat pending.  Respiratory panel for flu COVID and RSV pending.  Urinalysis pending.  Blood cultures pending.  Chest x-ray showed stable HD catheter tip in no acute abnormality.  CT of the abdomen pelvis showed new small to moderate right pleural effusion, stable cholecystostomy tube with persistent gallbladder wall thickening and a decompressed gallbladder.  Also noted was increased edema of the chest wall at the cholecystostomy tube site.  There were stable amount of large stool compared to previous at the rectum with wall thickening and stranding consistent with stercoral  colitis.  Patient received ceftriaxone, Flagyl, 1 L IV fluids and Zofran in the ED.  Started on a rate of IV fluids after bolus completion.  Reportedly did have a bowel movement but no detailed description on size but it was not watery.  Review of Systems: As per HPI otherwise all other systems reviewed and are negative.  Past Medical History:  Diagnosis Date   Cerebrovascular accident (CVA) due to occlusion of cerebral artery (HCC) 07/12/2019   CVA (cerebral vascular accident) (HCC)    residual left sided weakness   Dementia (HCC)    Diabetes (HCC)    Diabetes mellitus without complication (HCC)    ESRD (end stage renal disease) on dialysis (HCC)    Heart failure (HCC)    History of CT scan    History of heart attack    History of MRI    History of stroke    HTN (hypertension)    Kidney failure    PEG (percutaneous endoscopic gastrostomy) status (HCC)    Sepsis (HCC) 04/01/2023   SIRS (systemic inflammatory response syndrome) (HCC) 04/06/2023   Thyroid disease     Past Surgical History:  Procedure Laterality Date   ABDOMINAL HYSTERECTOMY     GALLBLADDER SURGERY     IR EXCHANGE BILIARY DRAIN  02/11/2023   IR EXCHANGE BILIARY DRAIN  04/13/2023   IR EXCHANGE BILIARY DRAIN  06/15/2023   IR EXCHANGE BILIARY DRAIN  08/23/2023   IR EXCHANGE BILIARY DRAIN  10/05/2023   IR EXCHANGE BILIARY DRAIN  11/30/2023   IR PERC CHOLECYSTOSTOMY  12/16/2022   IR REPLACE G-TUBE SIMPLE WO FLUORO  08/23/2023   IR REPLC GASTRO/COLONIC TUBE PERCUT W/FLUORO  06/15/2023   IR REPLC GASTRO/COLONIC TUBE PERCUT W/FLUORO  10/05/2023   IR REPLC GASTRO/COLONIC TUBE PERCUT W/FLUORO  11/30/2023    Social History  reports that she has never smoked. She does not have any smokeless tobacco history on file. She reports that she does not currently use alcohol. She reports that she does not use drugs.  Allergies  Allergen Reactions   Asa [Aspirin] Anaphylaxis   Penicillins Anaphylaxis    **Tolerates  cephalosporins    Family History  Problem Relation Age of Onset   Diabetes Mother   Reviewed on admission  Prior to Admission medications   Medication Sig Start Date End Date Taking? Authorizing Provider  acetaminophen (TYLENOL) 500 MG tablet Take 500 mg by mouth daily as needed for mild pain (pain score 1-3).   Yes [provider]  apixaban (ELIQUIS) 2.5 MG TABS tablet Take 1 tablet (2.5 mg total) by mouth 2 (two) times daily. 11/24/23  Yes Veludandi, Prashanthi, MD  atorvastatin (LIPITOR) 10 MG tablet Take 1 tablet (10 mg total) by mouth daily. Patient taking differently: Take 10 mg by mouth at bedtime. 10/19/23  Yes Tye Gall, MD  carvedilol (COREG) 3.125 MG tablet Take 3.125 mg Q AM  and 3.125 mg Q PM on non dialysis days (Tues, Thurs, Sat and Sun,  - On dialysis days,  MWF, continue Coreg 3.125 mg Q PM -  Hold for SBP < 100 Patient taking differently: Take 3.125 mg by mouth See admin instructions. Take 3.125 mg by mouth twice daily on non dialysis days. Take 3.125 mg by mouth only in the evening on dialysis days. 11/24/23  Yes Veludandi, Prashanthi, MD  doxycycline (VIBRA-TABS) 100 MG tablet Take 1 tablet (100 mg total) by mouth 2 (two) times daily for 10 days. 12/05/23 12/15/23 Yes Blair, Diane W, FNP  Glucagon (BAQSIMI ONE PACK) 3 MG/DOSE POWD Place 1 packet into the nose as needed (For blood sugar). Use when blood sugar drops below 70.   Yes [provider]  insulin aspart (NOVOLOG) 100 UNIT/ML injection 0-9 Units, Subcutaneous, 3 times daily with meals, CBG < 70: Implement Hypoglycemia measures CBG 70 - 120: 0 units CBG 121 - 150: 1 unit CBG 151 - 200: 2 units CBG 201 - 250: 3 units CBG 251 - 300: 5 units CBG 301 - 350: 7 units CBG 351 - 400: 9 units CBG > 400: call MD Patient taking differently: Inject 0-10 Units into the skin in the morning, at noon, and at bedtime. BS <70  inject 0 units  BS 70-90 inject 1 unit  BS 91-130 inject 2 units  BS 131-150 inject 3  units  BS 151-200 inject 4 units  BS 201-250 inject 5 units  BS 251-300 inject 6 units  BS 301-350 inject 7 units  BS 351-400 inject 8 units  BS 401-450 inject 9 units  BS > 450 inject 10 units 12/25/22  Yes Ghimire, Estil Heman, MD  insulin glargine (LANTUS) 100 UNIT/ML Solostar Pen Inject 15 Units into the skin at bedtime.   Yes [provider]  Multiple Vitamins-Minerals (MULTIVITAMIN WITH MINERALS) tablet Take 1 tablet by mouth at bedtime.   Yes [provider]  Nutritional Supplements (FEEDING SUPPLEMENT, OSMOLITE 1.5 CAL,) LIQD Place 600 mLs into feeding tube See admin instructions. 8p-6a   Yes [provider]  senna (SENOKOT) 8.6 MG TABS tablet Take 1  tablet (8.6 mg total) by mouth daily. Patient taking differently: Take 1 tablet by mouth in the morning and at bedtime. 09/01/23  Yes Durwin Glaze, MD  promethazine (PHENERGAN) 25 MG tablet Take 0.5 tablets (12.5 mg total) by mouth every 6 (six) hours as needed for nausea or vomiting. 09/01/23   Durwin Glaze, MD    Physical Exam: Vitals:   12/06/23 0933 12/06/23 0935 12/06/23 1200 12/06/23 1230  BP:  132/79 (!) 141/83 129/67  Pulse:  (!) 104 (!) 102 87  Resp:  18 15 16   Temp: 98.2 F (36.8 C)     TempSrc: Oral     SpO2:  100% 100% 100%    Physical Exam Constitutional:      General: She is not in acute distress.    Appearance: Normal appearance.  HENT:     Head: Normocephalic and atraumatic.     Mouth/Throat:     Mouth: Mucous membranes are moist.     Pharynx: Oropharynx is clear.  Eyes:     Extraocular Movements: Extraocular movements intact.     Pupils: Pupils are equal, round, and reactive to light.  Cardiovascular:     Rate and Rhythm: Normal rate and regular rhythm.     Pulses: Normal pulses.     Heart sounds: Normal heart sounds.     Comments: HD catheter in place Pulmonary:     Effort: Pulmonary effort is normal. No respiratory distress.     Breath sounds: Normal breath sounds.   Abdominal:     General: Bowel sounds are normal. There is no distension.     Palpations: Abdomen is soft.     Tenderness: There is no abdominal tenderness.     Comments: PEG tube, cholecystostomy tube  Musculoskeletal:        General: No swelling or deformity.  Skin:    General: Skin is warm and dry.  Neurological:     General: No focal deficit present.     Mental Status: Mental status is at baseline.    Labs on Admission: I have personally reviewed following labs and imaging studies  CBC: Recent Labs  Lab 12/06/23 0701  WBC 9.0  NEUTROABS 5.4  HGB 10.7*  HCT 32.7*  MCV 92.4  PLT 149*    Basic Metabolic Panel: Recent Labs  Lab 12/06/23 0730  NA 130*  K 5.0  CL 91*  CO2 29  GLUCOSE 236*  BUN 31*  CREATININE 3.91*  CALCIUM 9.2    GFR: CrCl cannot be calculated (Unknown ideal weight.).  Liver Function Tests: Recent Labs  Lab 12/06/23 0730  AST 30  ALT 28  ALKPHOS 159*  BILITOT 0.6  PROT 7.1  ALBUMIN 3.1*    Urine analysis:    Component Value Date/Time   COLORURINE YELLOW 09/25/2023 1814   APPEARANCEUR CLEAR 09/25/2023 1814   LABSPEC 1.010 09/25/2023 1814   PHURINE 7.0 09/25/2023 1814   GLUCOSEU NEGATIVE 09/25/2023 1814   HGBUR SMALL (A) 09/25/2023 1814   BILIRUBINUR NEGATIVE 09/25/2023 1814   KETONESUR NEGATIVE 09/25/2023 1814   PROTEINUR 100 (A) 09/25/2023 1814   NITRITE NEGATIVE 09/25/2023 1814   LEUKOCYTESUR TRACE (A) 09/25/2023 1814    Radiological Exams on Admission: CT ABDOMEN PELVIS W CONTRAST Result Date: 12/06/2023 CLINICAL DATA:  Abdominal pain, acute, nonlocalized Sepsis EXAM: CT ABDOMEN AND PELVIS WITH CONTRAST TECHNIQUE: Multidetector CT imaging of the abdomen and pelvis was performed using the standard protocol following bolus administration of intravenous contrast. RADIATION DOSE REDUCTION: This exam  was performed according to the departmental dose-optimization program which includes automated exposure control, adjustment of the  mA and/or kV according to patient size and/or use of iterative reconstruction technique. CONTRAST:  75mL OMNIPAQUE IOHEXOL 350 MG/ML SOLN COMPARISON:  Noncontrast abdominopelvic CT 09/25/2023 and 09/01/2023. FINDINGS: Lower chest: New small to moderate dependent right pleural effusion with increased right basilar atelectasis. Atelectasis at the left lung base has not significantly changed. Unchanged position of the hemodialysis catheter, near the tricuspid valve. Stable cardiomegaly. Hepatobiliary: The liver is normal in density without suspicious focal abnormality. Cholecystostomy tube remains in place with stable decompression of the gallbladder. Persistent gallbladder wall thickening and small calcified gallstones. No significant biliary dilatation. Pancreas: Unremarkable. No pancreatic ductal dilatation or surrounding inflammatory changes. Spleen: Normal in size without focal abnormality. Adrenals/Urinary Tract: Both adrenal glands appear normal. No evidence of urinary tract calculus, suspicious renal lesion or hydronephrosis. Mild renal cortical thinning and small renal cysts bilaterally for which no specific follow-up imaging is recommended. The bladder appears unremarkable for its degree of distention. Stomach/Bowel: No enteric contrast administered. Percutaneous gastrostomy tube remains in place. A large amount of stool remains within the rectum with stable mild rectal wall thickening and perirectal soft tissue stranding. No other bowel distension, wall thickening or surrounding inflammation identified. The appendix appears normal. Vascular/Lymphatic: There are no enlarged abdominal or pelvic lymph nodes. Infrarenal IVC filter remains in place. There is asymmetric decreased opacification of the left iliac veins without obvious DVT. Aortic and branch vessel atherosclerosis without evidence of aneurysm or large vessel occlusion. Reproductive: Status post hysterectomy.  No adnexal mass. Other: Increased edema  in the right lateral chest wall adjacent to the cholecystostomy tube. No focal fluid collection identified. No ascites or pneumoperitoneum. Musculoskeletal: No acute or significant osseous findings. Mild spondylosis. IMPRESSION: 1. New small to moderate dependent right pleural effusion with increased right basilar atelectasis. 2. Stable position of the cholecystostomy tube with stable decompression of the gallbladder. Persistent gallbladder wall thickening and small calcified gallstones. 3. Increased edema in the right lateral chest wall adjacent to the cholecystostomy tube. No focal fluid collection identified. 4. Stable large amount of stool within the rectum with stable mild rectal wall thickening and perirectal soft tissue stranding, suspicious for stercoral colitis. 5. Asymmetric decreased opacification of the left iliac veins without obvious DVT. IVC filter in place. 6.  Aortic Atherosclerosis (ICD10-I70.0). Electronically Signed   By: Elmon Hagedorn M.D.   On: 12/06/2023 10:54   DG Chest Port 1 View Result Date: 12/06/2023 CLINICAL DATA:  Questionable sepsis EXAM: PORTABLE CHEST 1 VIEW COMPARISON:  06/16/2023 FINDINGS: Chronic cardiomegaly. Dialysis catheter with tip the lower right atrium, unchanged. Low volume chest with indistinct density at the right base that is stable and likely scarring. No edema, effusion, or pneumothorax. Catheter over the right upper quadrant, cholecystostomy tube by prior reports IMPRESSION: Stable chest including dialysis catheter with tip at the lower right atrium. Electronically Signed   By: Ronnette Coke M.D.   On: 12/06/2023 07:23   EKG: Independently reviewed.  Sinus tachycardia 107 bpm.  Low voltage in multiple leads.  Nonspecific T wave changes.  Assessment/Plan Active Problems:   History of CVA with residual deficit   Heart failure with reduced ejection fraction (HCC)   History of DVT (deep vein thrombosis)   DM2 (diabetes mellitus, type 2) (HCC)    Essential hypertension   Diabetic peripheral neuropathy (HCC)   Dementia due to Alzheimer's disease (HCC)   ESRD (end stage renal disease) (HCC)  Stercoral colitis Fever > Patient presented due to reported fever and recent nausea and vomiting. > No leukocytosis.  Stable chest x-ray.  CT and pelvis showed stable gallbladder wall thickening and a decompressed gallbladder with stable cholecystostomy tube placement.  Some edema at the chest wall at the tube site.  Also noted large stool in the rectum with wall thickening and fat stranding consistent with stercoral colitis. > 1 small nonwatery bowel movement in the ED. > Received ceftriaxone, Flagyl, 1 L IV fluids in the ED. - Monitor on progressive unit overnight considering initial hypotension, now improved. - Continue ceftriaxone, Flagyl - Continue with gentle IV fluids (on HD) - Bowel rest, reduced rate requested for tube feeds - Soapsuds enema - Scheduled MiraLAX - Supportive care - May need consult to general surgery if conservative measures fail ADDENDUM > Having issues with access.  Will hold IV fluids his blood pressure improved and has received some fluids.  Labial abscess Fever > New labial abscess confirmed to be draining in the ED.  Has been on doxycycline for this. > No white count has been in the setting of reported fevers.  - Will be on ceftriaxone and Flagyl as above while admitted - Trend fever curve and WBC - Follow-up urinalysis, blood cultures  Hypertension - Resume carvedilol tomorrow with improvement of blood pressure  Diabetes > Recorded as 15 units at home and sliding scale, awaiting confirmation. - 5 units nightly - SSI  CHF > Last echo in 2024 with EF 40 and 40%, indeterminate diastolic function, normal RV function. - Volume managed with dialysis - Continue carvedilol  Dementia > At baseline which is alert and oriented x 3 - Noted  History of DVT - Transition to heparin in case conservative  interventions fail to treat stercoral colitis. ADDENDUM > Difficult to obtain access - Lovenox dose tonight with pharmacy monitoring, switch to heparin tomorrow.  Consulted with pharmacy for monitoring and appropriate timing.  ESRD > MWF.  Last dialysis was at home on Friday.  Due today - Consult nephrology  DVT prophylaxis:  heparin  Code Status:   Full Family Communication:  None on admission.  Attempted to reach daughter by phone, but there was no answer.  Disposition Plan:   Patient is from:  Home  Anticipated DC to:  Home  Anticipated DC date:  1 to 4 days  Anticipated DC barriers: None  Consults called:  Nephrology Admission status:  Observation, progressive  Severity of Illness: The appropriate patient status for this patient is OBSERVATION. Observation status is judged to be reasonable and necessary in order to provide the required intensity of service to ensure the patient's safety. The patient's presenting symptoms, physical exam findings, and initial radiographic and laboratory data in the context of their medical condition is felt to place them at decreased risk for further clinical deterioration. Furthermore, it is anticipated that the patient will be medically stable for discharge from the hospital within 2 midnights of admission.    Johnetta Nab MD [facility]  How to contact the [facility] Attending or Consulting provider 7A - 7P or covering provider during after hours 7P -7A, for this patient?   Check the care team in [facility] and look for a) attending/consulting [facility] provider listed and b) the [facility] team listed Log into [email or web address] and use [facility]'s universal password to access. If you do not have the password, please contact the hospital operator. Locate the [facility] provider you are looking for under [facility] and page  to a number that you can be directly reached. If you still have difficulty reaching the provider, please page the [facility] (Director on Call)  for the Hospitalists listed on amion for assistance.  12/06/2023, 12:39 PM

## 2023-12-06 NOTE — ED Notes (Signed)
 Daughter Heyward Loud [phone] would like an update asap

## 2023-12-06 NOTE — ED Notes (Signed)
 IV team consulted and RN tried x2 with no success. Existing IV became infiltrated. LR stopped and Heparin drip still needs to be initiated. Waiting on

## 2023-12-07 DIAGNOSIS — I6930 Unspecified sequelae of cerebral infarction: Secondary | ICD-10-CM | POA: Diagnosis not present

## 2023-12-07 DIAGNOSIS — G309 Alzheimer's disease, unspecified: Secondary | ICD-10-CM | POA: Diagnosis present

## 2023-12-07 DIAGNOSIS — I5020 Unspecified systolic (congestive) heart failure: Secondary | ICD-10-CM | POA: Diagnosis not present

## 2023-12-07 DIAGNOSIS — Z992 Dependence on renal dialysis: Secondary | ICD-10-CM | POA: Diagnosis not present

## 2023-12-07 DIAGNOSIS — D649 Anemia, unspecified: Secondary | ICD-10-CM | POA: Diagnosis not present

## 2023-12-07 DIAGNOSIS — Z95828 Presence of other vascular implants and grafts: Secondary | ICD-10-CM | POA: Diagnosis not present

## 2023-12-07 DIAGNOSIS — E1165 Type 2 diabetes mellitus with hyperglycemia: Secondary | ICD-10-CM | POA: Diagnosis not present

## 2023-12-07 DIAGNOSIS — Z86718 Personal history of other venous thrombosis and embolism: Secondary | ICD-10-CM | POA: Diagnosis not present

## 2023-12-07 DIAGNOSIS — K5909 Other constipation: Secondary | ICD-10-CM | POA: Diagnosis not present

## 2023-12-07 DIAGNOSIS — E119 Type 2 diabetes mellitus without complications: Secondary | ICD-10-CM | POA: Diagnosis not present

## 2023-12-07 DIAGNOSIS — Z794 Long term (current) use of insulin: Secondary | ICD-10-CM | POA: Diagnosis not present

## 2023-12-07 DIAGNOSIS — N764 Abscess of vulva: Secondary | ICD-10-CM | POA: Diagnosis present

## 2023-12-07 DIAGNOSIS — Z743 Need for continuous supervision: Secondary | ICD-10-CM | POA: Diagnosis not present

## 2023-12-07 DIAGNOSIS — N186 End stage renal disease: Secondary | ICD-10-CM | POA: Diagnosis present

## 2023-12-07 DIAGNOSIS — I5022 Chronic systolic (congestive) heart failure: Secondary | ICD-10-CM | POA: Diagnosis present

## 2023-12-07 DIAGNOSIS — I10 Essential (primary) hypertension: Secondary | ICD-10-CM | POA: Diagnosis not present

## 2023-12-07 DIAGNOSIS — Z7401 Bed confinement status: Secondary | ICD-10-CM | POA: Diagnosis not present

## 2023-12-07 DIAGNOSIS — K5289 Other specified noninfective gastroenteritis and colitis: Secondary | ICD-10-CM | POA: Diagnosis present

## 2023-12-07 DIAGNOSIS — N2581 Secondary hyperparathyroidism of renal origin: Secondary | ICD-10-CM | POA: Diagnosis present

## 2023-12-07 DIAGNOSIS — F0280 Dementia in other diseases classified elsewhere without behavioral disturbance: Secondary | ICD-10-CM | POA: Diagnosis present

## 2023-12-07 DIAGNOSIS — E16A1 Hypoglycemia level 1: Secondary | ICD-10-CM | POA: Diagnosis present

## 2023-12-07 DIAGNOSIS — D631 Anemia in chronic kidney disease: Secondary | ICD-10-CM | POA: Diagnosis present

## 2023-12-07 DIAGNOSIS — K5641 Fecal impaction: Secondary | ICD-10-CM | POA: Diagnosis present

## 2023-12-07 DIAGNOSIS — E1142 Type 2 diabetes mellitus with diabetic polyneuropathy: Secondary | ICD-10-CM | POA: Diagnosis present

## 2023-12-07 DIAGNOSIS — D696 Thrombocytopenia, unspecified: Secondary | ICD-10-CM

## 2023-12-07 DIAGNOSIS — A419 Sepsis, unspecified organism: Secondary | ICD-10-CM | POA: Diagnosis not present

## 2023-12-07 DIAGNOSIS — Z931 Gastrostomy status: Secondary | ICD-10-CM | POA: Diagnosis not present

## 2023-12-07 DIAGNOSIS — I132 Hypertensive heart and chronic kidney disease with heart failure and with stage 5 chronic kidney disease, or end stage renal disease: Secondary | ICD-10-CM | POA: Diagnosis present

## 2023-12-07 DIAGNOSIS — E11649 Type 2 diabetes mellitus with hypoglycemia without coma: Secondary | ICD-10-CM | POA: Diagnosis present

## 2023-12-07 DIAGNOSIS — R933 Abnormal findings on diagnostic imaging of other parts of digestive tract: Secondary | ICD-10-CM

## 2023-12-07 DIAGNOSIS — E1122 Type 2 diabetes mellitus with diabetic chronic kidney disease: Secondary | ICD-10-CM | POA: Diagnosis present

## 2023-12-07 DIAGNOSIS — L89152 Pressure ulcer of sacral region, stage 2: Secondary | ICD-10-CM | POA: Diagnosis present

## 2023-12-07 DIAGNOSIS — E871 Hypo-osmolality and hyponatremia: Secondary | ICD-10-CM | POA: Diagnosis present

## 2023-12-07 DIAGNOSIS — I120 Hypertensive chronic kidney disease with stage 5 chronic kidney disease or end stage renal disease: Secondary | ICD-10-CM | POA: Diagnosis not present

## 2023-12-07 DIAGNOSIS — I69354 Hemiplegia and hemiparesis following cerebral infarction affecting left non-dominant side: Secondary | ICD-10-CM | POA: Diagnosis not present

## 2023-12-07 DIAGNOSIS — Z1152 Encounter for screening for COVID-19: Secondary | ICD-10-CM | POA: Diagnosis not present

## 2023-12-07 LAB — GLUCOSE, CAPILLARY
Glucose-Capillary: 113 mg/dL — ABNORMAL HIGH (ref 70–99)
Glucose-Capillary: 115 mg/dL — ABNORMAL HIGH (ref 70–99)
Glucose-Capillary: 132 mg/dL — ABNORMAL HIGH (ref 70–99)
Glucose-Capillary: 137 mg/dL — ABNORMAL HIGH (ref 70–99)
Glucose-Capillary: 142 mg/dL — ABNORMAL HIGH (ref 70–99)
Glucose-Capillary: 149 mg/dL — ABNORMAL HIGH (ref 70–99)

## 2023-12-07 LAB — RENAL FUNCTION PANEL
Albumin: 3.1 g/dL — ABNORMAL LOW (ref 3.5–5.0)
Anion gap: 13 (ref 5–15)
BUN: 31 mg/dL — ABNORMAL HIGH (ref 8–23)
CO2: 25 mmol/L (ref 22–32)
Calcium: 9.1 mg/dL (ref 8.9–10.3)
Chloride: 97 mmol/L — ABNORMAL LOW (ref 98–111)
Creatinine, Ser: 3.92 mg/dL — ABNORMAL HIGH (ref 0.44–1.00)
GFR, Estimated: 11 mL/min — ABNORMAL LOW (ref >60)
Glucose, Bld: 114 mg/dL — ABNORMAL HIGH (ref 70–99)
Phosphorus: 3.9 mg/dL (ref 2.5–4.6)
Potassium: 5.5 mmol/L — ABNORMAL HIGH (ref 3.5–5.1)
Sodium: 135 mmol/L (ref 135–145)

## 2023-12-07 LAB — CBC
HCT: 31.1 % — ABNORMAL LOW (ref 36.0–46.0)
Hemoglobin: 10 g/dL — ABNORMAL LOW (ref 12.0–15.0)
MCH: 29.5 pg (ref 26.0–34.0)
MCHC: 32.2 g/dL (ref 30.0–36.0)
MCV: 91.7 fL (ref 80.0–100.0)
Platelets: 121 10*3/uL — ABNORMAL LOW (ref 150–400)
RBC: 3.39 MIL/uL — ABNORMAL LOW (ref 3.87–5.11)
RDW: 16.1 % — ABNORMAL HIGH (ref 11.5–15.5)
WBC: 5.9 10*3/uL (ref 4.0–10.5)
nRBC: 0 % (ref 0.0–0.2)

## 2023-12-07 LAB — COMPREHENSIVE METABOLIC PANEL WITH GFR
ALT: 29 U/L (ref 0–44)
AST: 33 U/L (ref 15–41)
Albumin: 3.1 g/dL — ABNORMAL LOW (ref 3.5–5.0)
Alkaline Phosphatase: 146 U/L — ABNORMAL HIGH (ref 38–126)
Anion gap: 14 (ref 5–15)
BUN: 31 mg/dL — ABNORMAL HIGH (ref 8–23)
CO2: 24 mmol/L (ref 22–32)
Calcium: 9.1 mg/dL (ref 8.9–10.3)
Chloride: 97 mmol/L — ABNORMAL LOW (ref 98–111)
Creatinine, Ser: 4.09 mg/dL — ABNORMAL HIGH (ref 0.44–1.00)
GFR, Estimated: 11 mL/min — ABNORMAL LOW (ref >60)
Glucose, Bld: 113 mg/dL — ABNORMAL HIGH (ref 70–99)
Potassium: 5.5 mmol/L — ABNORMAL HIGH (ref 3.5–5.1)
Sodium: 135 mmol/L (ref 135–145)
Total Bilirubin: 0.5 mg/dL (ref 0.0–1.2)
Total Protein: 6.9 g/dL (ref 6.5–8.1)

## 2023-12-07 LAB — HEPATITIS B SURFACE ANTIGEN: Hepatitis B Surface Ag: NONREACTIVE

## 2023-12-07 LAB — HEPARIN LEVEL (UNFRACTIONATED): Heparin Unfractionated: >1.1 [IU]/mL — ABNORMAL HIGH (ref 0.30–0.70)

## 2023-12-07 LAB — CBG MONITORING, ED: Glucose-Capillary: 90 mg/dL (ref 70–99)

## 2023-12-07 LAB — PHOSPHORUS: Phosphorus: 3.8 mg/dL (ref 2.5–4.6)

## 2023-12-07 LAB — APTT
aPTT: 64 s — ABNORMAL HIGH (ref 24–36)
aPTT: >200 s (ref 24–36)

## 2023-12-07 MED ORDER — BISACODYL 10 MG RE SUPP
10.0000 mg | Freq: Every day | RECTAL | Status: DC
  Administered 2023-12-08 – 2023-12-09 (×2): 10 mg via RECTAL
  Filled 2023-12-07 (×2): qty 1

## 2023-12-07 MED ORDER — JEVITY 1.5 CAL/FIBER PO LIQD
600.0000 mL | ORAL | Status: DC
  Administered 2023-12-07 – 2023-12-09 (×3): 600 mL
  Filled 2023-12-07 (×3): qty 711

## 2023-12-07 MED ORDER — SENNOSIDES-DOCUSATE SODIUM 8.6-50 MG PO TABS
1.0000 | ORAL_TABLET | Freq: Two times a day (BID) | ORAL | Status: DC
  Administered 2023-12-07 – 2023-12-09 (×5): 1
  Filled 2023-12-07 (×5): qty 1

## 2023-12-07 MED ORDER — HEPARIN (PORCINE) 25000 UT/250ML-% IV SOLN
1000.0000 [IU]/h | INTRAVENOUS | Status: DC
  Administered 2023-12-07: 1000 [IU]/h via INTRAVENOUS
  Filled 2023-12-07: qty 250

## 2023-12-07 MED ORDER — RENA-VITE PO TABS
1.0000 | ORAL_TABLET | Freq: Every day | ORAL | Status: DC
  Administered 2023-12-07 – 2023-12-09 (×3): 1 via ORAL
  Filled 2023-12-07 (×3): qty 1

## 2023-12-07 MED ORDER — HEPARIN SODIUM (PORCINE) 1000 UNIT/ML IJ SOLN
4500.0000 [IU] | Freq: Once | INTRAMUSCULAR | Status: AC
  Administered 2023-12-07: 4500 [IU]
  Filled 2023-12-07: qty 5

## 2023-12-07 NOTE — Progress Notes (Signed)
 PHARMACY - ANTICOAGULATION CONSULT NOTE  Pharmacy Consult for heparin Indication:  h/o VTE  Labs: Recent Labs    12/06/23 0701 12/06/23 0730 12/07/23 0408 12/07/23 0409  HGB 10.7*  --  10.0*  --   HCT 32.7*  --  31.1*  --   PLT 149*  --  121*  --   APTT  --   --  64*  --   LABPROT  --  21.6*  --   --   INR  --  1.9*  --   --   HEPARINUNFRC  --   --  >1.10*  --   CREATININE  --  3.91*  --  3.92*   Assessment: 78yo female subtherapeutic on heparin with initial dosing while apixaban on hold; no infusion issues or signs of bleeding per RN.  Goal of Therapy:  aPTT 66-102 seconds   Plan:  Increase heparin infusion by 1-2 units/kg/hr to 1100 units/hr. Check level in 8 hours.   Lonnie Roberts, PharmD, BCPS 12/07/2023 5:23 AM

## 2023-12-07 NOTE — Progress Notes (Signed)
 Initial Nutrition Assessment  DOCUMENTATION CODES:   Not applicable  INTERVENTION:  Change tube feed formula to Jevity 1.5 @ 23mL/hr for 10 hours (8pm-6am). This has same nutritional content as home TF formula (Osmolite 1.5), just contains fiber.  This will provide 900 kcal, 37 g of protein and 12.6g of fiber  Rena-vite daily  Encourage po intake during day.   NUTRITION DIAGNOSIS:   Altered GI function related to constipation as evidenced by other (comment) (CT abdomen results).  GOAL:   Patient will meet greater than or equal to 90% of their needs  MONITOR:   PO intake, TF tolerance  REASON FOR ASSESSMENT:   Consult Enteral/tube feeding initiation and management  ASSESSMENT:   PMH: hypertension, diabetes, neuropathy, CVA, PEG tube, cholecystostomy tube, CHF, DVT, ESRD, pancytopenia, dementia presenting with fever from home and recent N/V. Adm for stercoral colitis. 4/14- CT scan shows stable amount of large stool compared to previous at the rectum with wall thickening and stranding consistent with stercoral colitis. Pt reportedly did have a bowel movement but no detailed description on size but it was not watery. Pt was given enema after HD on this date.   Home EN order: Osmolite 1.5 @ 60 mL/hr from 8pm- 6am (10 hours) which provides 900 kcal, 37 g of pro, 492 mL of free water. 100 mL water flushes before and after feeds. Pt eats 3 meals a day and has no problems chewing/swallowing per daughter. RD to change formula to a fiber containing formula. RD ran by MD and MD signed off.   Medications reviewed and include: Miralax, senna, Novolog, Semglee, zofran  Labs reviewed: CBGs 90-215 x24h, Potassium 5.5 H,    Intake/Output Summary (Last 24 hours) at 12/07/2023 1601 Last data filed at 12/07/2023 1235 Gross per 24 hour  Intake 225.66 ml  Output 1001 ml  Net -775.34 ml    Weights reviewed. Admit weight 57 kg. Pt's daughter reports stable weight hx.   NUTRITION - FOCUSED  PHYSICAL EXAM:  Flowsheet Row Most Recent Value  Orbital Region No depletion  Upper Arm Region No depletion  Thoracic and Lumbar Region No depletion  Buccal Region No depletion  Temple Region No depletion  Clavicle Bone Region No depletion  Clavicle and Acromion Bone Region No depletion  Scapular Bone Region No depletion  Dorsal Hand No depletion  Patellar Region No depletion  Anterior Thigh Region No depletion  Posterior Calf Region No depletion  Edema (RD Assessment) Mild  Hair Reviewed  Eyes Reviewed  Mouth Reviewed  Skin Reviewed  Nails Reviewed       Diet Order:   Diet Order             Diet renal with fluid restriction Fluid restriction: 1200 mL Fluid; Room service appropriate? Yes; Fluid consistency: Thin  Diet effective now                   EDUCATION NEEDS:   Education needs have been addressed  Skin:  Skin Assessment: Reviewed RN Assessment  Last BM:  4/15  Height:   Ht Readings from Last 1 Encounters:  12/06/23 4\' 11"  (1.499 m)    Weight:   Wt Readings from Last 1 Encounters:  12/07/23 55.4 kg    Ideal Body Weight:     BMI:  Body mass index is 24.67 kg/m.  Estimated Nutritional Needs:   Kcal:  1450-1700  Protein:  70-90  Fluid:  >1.5L or per MD  Kathrynn Speed, MPH, RD, LDN Clinical  Dietitian Contact information can be found at [facility].

## 2023-12-07 NOTE — Progress Notes (Signed)
 Soap suds enema administered at this time, patient required a lot of coaching per daughter and nursing staff to allow procedure. Lg loose brown liquid results as well as medium/large  formed brown stool expelled.

## 2023-12-07 NOTE — Progress Notes (Signed)
Pt receives out-pt HD at [facility] [place] on MWF. Will assist as needed.   Olivia Canter Renal Navigator [phone]

## 2023-12-07 NOTE — Progress Notes (Signed)
 Received patient in bed to unit.  Alert and oriented.  Informed consent signed and in chart.   TX duration: 3 hours and 15 minutes  Patient tolerated well.  Transported back to the room  Alert, without acute distress.  Hand-off given to patient's nurse.   Access used: Right internal jugular HD Cath Access issues: none  Total UF removed: 1L Medication(s) given: none   12/07/23 1235  Vitals  Temp  (Patient refused temperature)  BP (!) 112/59  MAP (mmHg) 73  BP Location Right Arm  BP Method Automatic  Patient Position (if appropriate) Lying  Pulse Rate 97  Pulse Rate Source Monitor  ECG Heart Rate 96  Resp (!) 24  Oxygen Therapy  SpO2 94 %  O2 Device Room Air  During Treatment Monitoring  Duration of HD Treatment -hour(s) 3.25 hour(s)  Cumulative Fluid Removed (mL) per Treatment  1000.09  HD Safety Checks Performed Yes  Intra-Hemodialysis Comments Tx completed  Dialysis Fluid Bolus Normal Saline  Bolus Amount (mL) 300 mL  Post Treatment  Dialyzer Clearance Clear  Liters Processed 68.3  Fluid Removed (mL) 1000 mL  Tolerated HD Treatment Yes  Hemodialysis Catheter Right Internal jugular  Placement Date/Time: (c) 12/07/23 (c) 6045   Placed prior to admission: Yes  Orientation: Right  Access Location: Internal jugular  Site Condition No complications  Blue Lumen Status Flushed;Heparin locked;Dead end cap in place  Red Lumen Status Flushed;Heparin locked;Dead end cap in place  Purple Lumen Status N/A  Catheter fill solution Heparin 1000 units/ml  Catheter fill volume (Arterial) 2.2 cc  Catheter fill volume (Venous) 2.3  Dressing Type Transparent  Dressing Status Antimicrobial disc/dressing in place;Clean, Dry, Intact  Interventions New dressing  Drainage Description None  Dressing Change Due 12/14/23  Post treatment catheter status Capped and Clamped     Luciano Ruths LPN [facility]

## 2023-12-07 NOTE — Consult Note (Signed)
 Consultation Note   Referring Provider:  [facility] PCP: Tye Gall, MD Primary Gastroenterologist:: [facility]  ( April 2025)      Reason for Consultation:  Stercoral colitis DOA: 12/06/2023         Hospital Day: 2   ASSESSMENT    78 y.o. year old female with a medical history including but not limited to HTN, DM, CVA, gastrostomy tube, cholecystostomy tube, cholelithiasis, CHF, ESRD on HD, dementia, history of DVT, HCV s/p treatment in 2012) Admitted 4/14 with fever and stercoral colitis. Also labial abscess  Chronic constipation.  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 Transitioned to heparin for now  ESRD on HD MWF See PMH for additional history     PLAN:   --SOAP suds enema --Continue BID miralax via g tube --Daily Dulcolax suppositories.  --Colonoscopy was offered to patient at time of 11/25/23 visit with [facility].  Daughter didn't want to proceed given risks of procedure.  --Needs more aggressive bowel regimen in outpatient setting. Reviewing home meds she is only on BID Senna. Would recommend BID miralax through G tube and also daily Dulcolax suppositories  HPI   Patient admitted yesterday for fevers at home, nausea and vomiting. Started on IV antibiotics. Blood cultures  pending  ED workup notable for :  Normal WBC, Hgb 10.7 ( at baseline), platelets 121 ( chronic but slightly lower than baseline), K+ 5.5. Imaging shows stercoral colitis  CT AP with contrast IMPRESSION: 1. New small to moderate dependent right pleural effusion with increased right basilar atelectasis. 2. Stable position of the cholecystostomy tube with stable decompression of the gallbladder. Persistent gallbladder wall thickening and small calcified gallstones. 3. Increased edema in the right lateral chest wall adjacent to the cholecystostomy tube. No focal fluid collection identified. 4. Stable large amount of stool within the rectum with stable mild rectal wall thickening and perirectal soft tissue stranding, suspicious for stercoral colitis. 5. Asymmetric decreased opacification of the left iliac veins without obvious DVT. IVC filter in place. 6.  Aortic Atherosclerosis (ICD10-I70.0).  On admission a soapsuds enema was ordered as well as scheduled MIralax and Senna. Appear the enema wasn't given ? Aaron Aas There is documentation of one BM so far today in I+O. Patient doesn't seem to be a reliable historian and has history of dementia. She denies a history of chronic constipation but reviewing previous notes it appears she does have chronic constipation. Denies abdominal or rectum pain.     Previous GI Studies   Reports having had a colonoscopy one year ago in [place]. Not found in Epic. She has since established care with GI in [facility] ( this month)  where colonoscopy was discussed but Daughter didn't agree based on risk of procedure   Labs and Imaging:  Recent Labs    12/06/23 0701 12/07/23 0408  WBC 9.0 5.9  HGB 10.7* 10.0*  HCT 32.7* 31.1*  MCV 92.4 91.7  PLT 149* 121*   No results for input(s): "FOLATE", "VITAMINB12", "FERRITIN", "TIBC", "IRONPCTSAT" in the last 72 hours. Recent Labs    12/06/23 0730 12/07/23 0406 12/07/23 0409  NA 130* 135 135  K 5.0 5.5* 5.5*  CL  91* 97* 97*  CO2 29 24 25   GLUCOSE 236* 113* 114*  BUN 31* 31* 31*  CREATININE 3.91* 4.09* 3.92*  CALCIUM 9.2 9.1 9.1   Recent Labs    12/06/23 0730 12/07/23 0406 12/07/23 0409  PROT 7.1 6.9  --   ALBUMIN 3.1* 3.1* 3.1*  AST 30 33  --   ALT 28 29  --   ALKPHOS 159* 146*  --   BILITOT 0.6 0.5  --    Recent Labs    12/06/23 0730  INR 1.9*   No results for input(s): "AFPTUMOR" in the last 72 hours.   Electronically Signed   By: Carey Bullocks M.D.   On: 12/06/2023 10:54 DG Chest Port 1 View CLINICAL DATA:  Questionable sepsis  EXAM: PORTABLE CHEST 1 VIEW  COMPARISON:  06/16/2023  FINDINGS: Chronic cardiomegaly. Dialysis catheter with tip the lower right [facility], unchanged. Low volume chest with indistinct density at the right base that is stable and likely scarring. No edema, effusion, or pneumothorax. Catheter over the right upper quadrant, cholecystostomy tube by prior reports  IMPRESSION: Stable chest including dialysis catheter with tip at the lower right [facility].  Electronically Signed   By: Tiburcio Pea M.D.   On: 12/06/2023 07:23    Past Medical History:  Diagnosis Date   Cerebrovascular accident (CVA) due to occlusion of cerebral artery (HCC) 07/12/2019   CVA (cerebral vascular accident) (HCC)    residual left sided weakness   Dementia (HCC)    Diabetes (HCC)    Diabetes mellitus without complication (HCC)    ESRD (end stage renal disease) on dialysis (HCC)    Heart failure (HCC)    History of CT scan    History of heart attack    History of MRI    History of stroke    HTN (hypertension)    Kidney failure    PEG (percutaneous endoscopic gastrostomy) status (HCC)    Sepsis (HCC) 04/01/2023   SIRS (systemic inflammatory response syndrome) (HCC) 04/06/2023   Thyroid disease     Past Surgical History:  Procedure Laterality Date   ABDOMINAL HYSTERECTOMY     GALLBLADDER SURGERY     IR EXCHANGE BILIARY DRAIN  02/11/2023   IR EXCHANGE  BILIARY DRAIN  04/13/2023   IR EXCHANGE BILIARY DRAIN  06/15/2023   IR EXCHANGE BILIARY DRAIN  08/23/2023   IR EXCHANGE BILIARY DRAIN  10/05/2023   IR EXCHANGE BILIARY DRAIN  11/30/2023   IR PERC CHOLECYSTOSTOMY  12/16/2022   IR REPLACE G-TUBE SIMPLE WO FLUORO  08/23/2023   IR REPLC GASTRO/COLONIC TUBE PERCUT W/FLUORO  06/15/2023   IR REPLC GASTRO/COLONIC TUBE PERCUT W/FLUORO  10/05/2023   IR REPLC GASTRO/COLONIC TUBE PERCUT W/FLUORO  11/30/2023    Family History  Problem Relation Age of Onset   Diabetes Mother     Prior to Admission medications   Medication Sig Start Date End Date Taking? Authorizing Provider  acetaminophen (TYLENOL) 500 MG tablet Take 500 mg by  mouth daily as needed for mild pain (pain score 1-3).   Yes [provider]  apixaban (ELIQUIS) 2.5 MG TABS tablet Take 1 tablet (2.5 mg total) by mouth 2 (two) times daily. 11/24/23  Yes Veludandi, Prashanthi, MD  atorvastatin (LIPITOR) 10 MG tablet Take 1 tablet (10 mg total) by mouth daily. Patient taking differently: Take 10 mg by mouth at bedtime. 10/19/23  Yes Tye Gall, MD  carvedilol (COREG) 3.125 MG tablet Take 3.125 mg Q AM  and 3.125 mg Q PM on non dialysis days (Tues, Thurs, Sat and Sun,  - On dialysis days,  MWF, continue Coreg 3.125 mg Q PM -  Hold for SBP < 100 Patient taking differently: Take 3.125 mg by mouth See admin instructions. Take 3.125 mg by mouth twice daily on non dialysis days. Take 3.125 mg by mouth only in the evening on dialysis days. 11/24/23  Yes Veludandi, Prashanthi, MD  doxycycline (VIBRA-TABS) 100 MG tablet Take 1 tablet (100 mg total) by mouth 2 (two) times daily for 10 days. 12/05/23 12/15/23 Yes Blair, Diane W, FNP  Glucagon (BAQSIMI ONE PACK) 3 MG/DOSE POWD Place 1 packet into the nose as needed (For blood sugar). Use when blood sugar drops below 70.   Yes [provider]  insulin aspart (NOVOLOG) 100 UNIT/ML injection 0-9 Units, Subcutaneous, 3 times daily with meals, CBG  < 70: Implement Hypoglycemia measures CBG 70 - 120: 0 units CBG 121 - 150: 1 unit CBG 151 - 200: 2 units CBG 201 - 250: 3 units CBG 251 - 300: 5 units CBG 301 - 350: 7 units CBG 351 - 400: 9 units CBG > 400: call MD Patient taking differently: Inject 0-10 Units into the skin in the morning, at noon, and at bedtime. BS <70  inject 0 units  BS 70-90 inject 1 unit  BS 91-130 inject 2 units  BS 131-150 inject 3 units  BS 151-200 inject 4 units  BS 201-250 inject 5 units  BS 251-300 inject 6 units  BS 301-350 inject 7 units  BS 351-400 inject 8 units  BS 401-450 inject 9 units  BS > 450 inject 10 units 12/25/22  Yes Ghimire, Estil Heman, MD  insulin glargine (LANTUS) 100 UNIT/ML Solostar Pen Inject 15 Units into the skin at bedtime.   Yes [provider]  Multiple Vitamins-Minerals (MULTIVITAMIN WITH MINERALS) tablet Take 1 tablet by mouth at bedtime.   Yes [provider]  Nutritional Supplements (FEEDING SUPPLEMENT, OSMOLITE 1.5 CAL,) LIQD Place 600 mLs into feeding tube See admin instructions. 8p-6a   Yes [provider]  senna (SENOKOT) 8.6 MG TABS tablet Take 1 tablet (8.6 mg total) by mouth daily. Patient taking differently: Take 1 tablet by mouth in the morning and at bedtime. 09/01/23  Yes Carin Charleston, MD  promethazine (PHENERGAN) 25 MG tablet Take 0.5 tablets (12.5 mg total) by mouth every 6 (six) hours as needed for nausea or vomiting. 09/01/23   Carin Charleston, MD    Current Facility-Administered Medications  Medication Dose Route Frequency Provider Last Rate Last Admin   acetaminophen (TYLENOL) tablet 650 mg  650 mg Per Tube Q6H PRN Johnetta Nab, MD       Or   acetaminophen (TYLENOL) suppository 650 mg  650 mg Rectal Q6H PRN Johnetta Nab, MD       alteplase (CATHFLO ACTIVASE) injection 2 mg  2 mg Intracatheter Once PRN Chucky Craver, MD       anticoagulant sodium  citrate solution 5 mL  5 mL Intracatheter PRN Delano Metz, MD       atorvastatin  (LIPITOR) tablet 10 mg  10 mg Per Tube QHS Synetta Fail, MD   10 mg at 12/06/23 2300   cefTRIAXone (ROCEPHIN) 2 g in sodium chloride 0.9 % 100 mL IVPB  2 g Intravenous Q24H Synetta Fail, MD 200 mL/hr at 12/07/23 0735 2 g at 12/07/23 0735   Chlorhexidine Gluconate Cloth 2 % PADS 6 each  6 each Topical Q0600 Delano Metz, MD   6 each at 12/07/23 0344   feeding supplement (NEPRO CARB STEADY) liquid 237 mL  237 mL Oral PRN Delano Metz, MD       feeding supplement (OSMOLITE 1.2 CAL) liquid 1,000 mL  1,000 mL Per Tube Q24H Synetta Fail, MD   1,000 mL at 12/06/23 2230   heparin ADULT infusion 100 units/mL (25000 units/214mL)  1,100 Units/hr Intravenous Continuous Juliette Mangle, RPH 11 mL/hr at 12/07/23 0556 1,100 Units/hr at 12/07/23 0556   heparin injection 1,000 Units  1,000 Units Intracatheter PRN Delano Metz, MD       heparin injection 1,000 Units  1,000 Units Intracatheter PRN Pola Corn, NP       heparin injection 1,500 Units  1,500 Units Dialysis PRN Delano Metz, MD       heparin injection 2,500 Units  2,500 Units Dialysis Once in dialysis Delano Metz, MD       heparin sodium (porcine) injection 4,500 Units  4,500 Units Intracatheter Once Delano Metz, MD       insulin aspart (novoLOG) injection 0-6 Units  0-6 Units Subcutaneous Q4H Synetta Fail, MD       insulin glargine-yfgn [facility]) injection 5 Units  5 Units Subcutaneous QHS Synetta Fail, MD   5 Units at 12/06/23 2300   lidocaine (PF) (XYLOCAINE) 1 % injection 5 mL  5 mL Intradermal PRN Delano Metz, MD       lidocaine-prilocaine (EMLA) cream 1 Application  1 Application Topical PRN Delano Metz, MD       metroNIDAZOLE (FLAGYL) IVPB 500 mg  500 mg Intravenous Q12H Synetta Fail, MD   Stopped at 12/07/23 0216   ondansetron (ZOFRAN) injection 4 mg  4 mg Intravenous Once Synetta Fail, MD       ondansetron [facility]) injection 4 mg  4 mg Intravenous Q8H PRN  Synetta Fail, MD       pentafluoroprop-tetrafluoroeth (GEBAUERS) aerosol 1 Application  1 Application Topical PRN Delano Metz, MD       polyethylene glycol (MIRALAX / GLYCOLAX) packet 17 g  17 g Per Tube BID Synetta Fail, MD   17 g at 12/06/23 2300   senna-docusate (Senokot-S) tablet 1 tablet  1 tablet Oral BID Rodolph Bong, MD       sodium chloride flush (NS) 0.9 % injection 3 mL  3 mL Intravenous Q12H Synetta Fail, MD   3 mL at 12/06/23 2230    Allergies as of 12/06/2023 - Review Complete 12/06/2023  Allergen Reaction Noted   Asa [aspirin] Anaphylaxis    Penicillins Anaphylaxis 03/25/2023    Social History   Socioeconomic History   Marital status: Single    Spouse name: Not on file   Number of children: Not on file   Years of education: Not on file   Highest education level: Some college, no degree  Occupational History   Not on file  Tobacco Use  Smoking status: Never   Smokeless tobacco: Not on file  Substance and Sexual Activity   Alcohol use: Not Currently   Drug use: Never   Sexual activity: Not Currently  Other Topics Concern   Not on file  Social History Narrative   Tobacco use, amount per day now: N/A   Past tobacco use, amount per day: N/A   How many years did you use tobacco: N/A   Alcohol use (drinks per week): N/A   Diet: N/A   Do you drink/eat things with caffeine: N/A   Marital status:  Widow                                What year were you married? Yes   Do you live in a house, apartment, assisted living, condo, trailer, etc.? House    Is it one or more stories? One story    How many persons live in your home? 2    Do you have pets in your home?( please list) No   Highest Level of education completed? High School    Current or past profession: Environmental health practitioner    Do you exercise?    N/A                              Type and how often?   Do you have a living will? No   Do you have a DNR form?    Yes                                If not, do you want to discuss one?   Do you have signed POA/HPOA forms?    No                    If so, please bring to you appointment      Do you have any difficulty bathing or dressing yourself? Yes   Do you have any difficulty preparing food or eating? Yes   Do you have any difficulty managing your medications? Yes   Do you have any difficulty managing your finances? Yes   Do you have any difficulty affording your medications? Yes   Social Drivers of Corporate investment banker Strain: Low Risk  (06/20/2023)   Overall Financial Resource Strain (CARDIA)    Difficulty of Paying Living Expenses: Not hard at all  Food Insecurity: No Food Insecurity (12/07/2023)   Hunger Vital Sign    Worried About Running Out of Food in the Last Year: Never true    Ran Out of Food in the Last Year: Never true  Transportation Needs: No Transportation Needs (12/07/2023)   PRAPARE - Administrator, Civil Service (Medical): No    Lack of Transportation (Non-Medical): No  Physical Activity: Unknown (06/20/2023)   Exercise Vital Sign    Days of Exercise per Week: 0 days    Minutes of Exercise per Session: Not on file  Stress: No Stress Concern Present (06/20/2023)   [employer] of Occupational Health - Occupational Stress Questionnaire    Feeling of Stress : Not at all  Social Connections: Socially Isolated (12/07/2023)   Social Connection and Isolation Panel [NHANES]    Frequency of Communication with Friends and Family: More than three times a week  Frequency of Social Gatherings with Friends and Family: More than three times a week    Attends Religious Services: Never    Database administrator or Organizations: No    Attends Banker Meetings: Never    Marital Status: Widowed  Intimate Partner Violence: Not At Risk (12/07/2023)   Humiliation, Afraid, Rape, and Kick questionnaire    Fear of Current or Ex-Partner: No    Emotionally Abused: No     Physically Abused: No    Sexually Abused: No     Code Status   Code Status: Full Code  Review of Systems: All systems reviewed and negative except where noted in HPI.  Physical Exam: Vital signs in last 24 hours: Temp:  [97.9 F (36.6 C)-99 F (37.2 C)] 97.9 F (36.6 C) (04/15 0853) Pulse Rate:  [75-106] 105 (04/15 1132) Resp:  [12-25] 25 (04/15 1132) BP: (92-145)/(58-109) 98/68 (04/15 1132) SpO2:  [92 %-100 %] 96 % (04/15 1132) Weight:  [56.4 kg-60.8 kg] 56.4 kg (04/15 0901) Last BM Date : 12/07/23  General:  Pleasant female in NAD in hemodialysis.  Psych:  Cooperative. Not animated Eyes: Pupils equal Ears:  Normal auditory acuity Nose: No deformity, discharge or lesions Neck:  Supple, no masses felt Lungs:  Clear to auscultation.  Heart:  Regular rate.  Abdomen:  Soft, nondistended, nontender, active bowel sounds, no masses felt Rectal :  Deferred Msk: Symmetrical without gross deformities.  Neurologic:  Alert, oriented, grossly normal neurologically Extremities : No edema Skin:  Intact without significant lesions.    Intake/Output from previous day: 04/14 0701 - 04/15 0700 In: 225.7 [I.V.:112.7; NG/GT:112.9] Out: -  Intake/Output this shift:  Total I/O In: -  Out: 1 [Stool:1]   Mai Schwalbe, NP-C   12/07/2023, 11:40 AM

## 2023-12-07 NOTE — Progress Notes (Signed)
 PROGRESS NOTE    Samantha [name]  WUJ:[id number] DOB: May 07, 1946 DOA: 12/06/2023 PCP: [name], MD    Chief Complaint  Patient presents with   Fever    Brief Narrative:  Patient is a 78 year old female history of hypertension, diabetes with neuropathy, history of CVA, PEG tube, status post cholecystostomy tube, CHF, history of DVT, ESRD on HD, pancytopenia, dementia presented to the ED with fevers.  Patient noted to be on antibiotics for labial abscess which noted to be actively draining per admitting physician and EDP.  Patient admitted COVID-19 PCR, RSV PCR, influenza A and B PCR negative.  CT abdomen and pelvis done noted a moderate right pleural effusion, stable cholecystostomy tube with persistent gallbladder wall thickening and a decompressed gallbladder, increased edema of the chest wall at the cholecystostomy tube site, large stool noted compared to previous at the rectum with wall thickening and stranding consistent with stercoral colitis.  Patient placed empirically on IV antibiotics, placed on the bowel regimen as well as an enema.  Nephrology consulted.  Gastroenterology consulted.   Assessment & Plan:   Principal Problem:   Stercoral colitis Active Problems:   History of CVA with residual deficit   Heart failure with reduced ejection fraction (HCC)   History of DVT (deep vein thrombosis)   DM2 (diabetes mellitus, type 2) (HCC)   Essential hypertension   Diabetic peripheral neuropathy (HCC)   Dementia due to Alzheimer's disease (HCC)   ESRD (end stage renal disease) (HCC)   Labial abscess  #1 Stercoral colitis/fever -Patient noted to have presented with reported fever and recent nausea and vomiting. - Patient noted in the ED to be afebrile, no leukocytosis, chest x-ray stable. - CT abdomen and pelvis with stable gallbladder wall thickening and decompressed gallbladder with stable cholecystostomy tube placement.  Some edema noted to chest wall at tube site.   Large stool burden in the rectum with wall thickening and fat stranding consistent with stercoral colitis. - Patient initially noted to be hypotensive that improved. - Patient placed on bowel rest. - Blood cultures ordered and pending. - Soapsuds enema ordered on admission however patient noted to have refused and per RN patient willing to undergo enema today. - Place on MiraLAX twice daily, Senokot-S twice daily. - Continue empiric IV Rocephin and IV Flagyl. - Will consult with gastroenterology for further evaluation and management.  2.  Labial abscess/fever -Patient noted to have a labial abscess confirmed to be draining in the ED. - Patient noted to be on doxycycline prior to admission for this. - No leukocytosis noted. - Patient pancultured cultures pending. - Continue empiric IV Rocephin and IV Flagyl.  3.  Hypertension -Noted initially on presentation to be hypotensive which has since improved. - Continue to hold home regimen Coreg.  4.  Diabetes mellitus type 2 -Hemoglobin A1c 6.6 (09/25/2023) -CBG 113 this morning. - Patient's home regimen includes Lantus 15 units daily, SSI. - Continue Semglee 5 units daily, SSI.  5.  CHF -2D echo from 2024 with EF of 40 to 45%, indeterminate diastolic dysfunction, normal RV function. - Coreg on hold. - On HD which is managing patient's volume.  6.  Dementia -Currently at baseline alert and oriented x 3.  7.  History of DVT -Continue IV heparin pending treatment of stercoral colitis and if no procedures planned could resume back on home regimen of Eliquis.  8.  ESRD, MWF - Patient seen in consultation by nephrology and patient currently in HD. - Per nephrology.  DVT prophylaxis: Heparin GTT Code Status: Full Family Communication: Updated patient.  No family at bedside. Disposition: TBD  Status is: Inpatient The patient will require care spanning > 2 midnights and should be moved to inpatient because: Severity of illness    Consultants:  Nephrology Dr.Schertz 12/06/2023 Gastroenterology pending  Procedures:  CT abdomen and pelvis 12/06/2023 Chest x-ray 12/06/2023   Antimicrobials:  Anti-infectives (From admission, onward)    Start     Dose/Rate Route Frequency Ordered Stop   12/07/23 0800  cefTRIAXone (ROCEPHIN) 2 g in sodium chloride 0.9 % 100 mL IVPB        2 g 200 mL/hr over 30 Minutes Intravenous Every 24 hours 12/06/23 1245     12/06/23 2200  metroNIDAZOLE (FLAGYL) IVPB 500 mg        500 mg 100 mL/hr over 60 Minutes Intravenous Every 12 hours 12/06/23 1245     12/06/23 1200  metroNIDAZOLE (FLAGYL) IVPB 500 mg        500 mg 100 mL/hr over 60 Minutes Intravenous  Once 12/06/23 1152 12/06/23 1311   12/06/23 0815  cefTRIAXone (ROCEPHIN) 2 g in sodium chloride 0.9 % 100 mL IVPB        2 g 200 mL/hr over 30 Minutes Intravenous  Once 12/06/23 0807 12/06/23 7829         Subjective: Patient in hemodialysis.  Denies any chest pain or shortness of breath.  Denies any abdominal pain.  Patient denies any bowel movement.  Objective: Vitals:   12/07/23 1252 12/07/23 1255 12/07/23 1300 12/07/23 1557  BP: 109/63  104/62 126/68  Pulse: 95  93 87  Resp: (!) 23  (!) 23 16  Temp:    98.2 F (36.8 C)  TempSrc:    Oral  SpO2: 96%  97% 99%  Weight:  55.4 kg    Height:        Intake/Output Summary (Last 24 hours) at 12/07/2023 1846 Last data filed at 12/07/2023 1700 Gross per 24 hour  Intake 225.66 ml  Output 2001 ml  Net -1775.34 ml   Filed Weights   12/07/23 0252 12/07/23 0901 12/07/23 1255  Weight: 60.8 kg 56.4 kg 55.4 kg    Examination:  General exam: Appears calm and comfortable.  Dry mucous membranes. Respiratory system: Clear to auscultation anterior lung fields.  No wheezing, no crackles, no rhonchi.  Fair air movement.  Speaking in full sentences.  Cardiovascular system: S1 & S2 heard, RRR. No JVD, murmurs, rubs, gallops or clicks. No pedal edema. Gastrointestinal system: Abdomen is  nondistended, soft and nontender. No organomegaly or masses felt. Normal bowel sounds heard.  PEG tube in place.  Cholecystostomy tube right upper quadrant Central nervous system: Alert and oriented.  Moving extremities spontaneously.  No focal neurological deficits. Extremities: Symmetric 5 x 5 power. Skin: No rashes, lesions or ulcers Psychiatry: Judgement and insight appear fair. Mood & affect appropriate.     Data Reviewed: I have personally reviewed following labs and imaging studies  CBC: Recent Labs  Lab 12/06/23 0701 12/07/23 0408  WBC 9.0 5.9  NEUTROABS 5.4  --   HGB 10.7* 10.0*  HCT 32.7* 31.1*  MCV 92.4 91.7  PLT 149* 121*    Basic Metabolic Panel: Recent Labs  Lab 12/06/23 0730 12/07/23 0406 12/07/23 0408 12/07/23 0409  NA 130* 135  --  135  K 5.0 5.5*  --  5.5*  CL 91* 97*  --  97*  CO2 29 24  --  25  GLUCOSE 236* 113*  --  114*  BUN 31* 31*  --  31*  CREATININE 3.91* 4.09*  --  3.92*  CALCIUM 9.2 9.1  --  9.1  PHOS  --   --  3.8 3.9    GFR: Estimated Creatinine Clearance: 9.1 mL/min (A) (by C-G formula based on SCr of 3.92 mg/dL (H)).  Liver Function Tests: Recent Labs  Lab 12/06/23 0730 12/07/23 0406 12/07/23 0409  AST 30 33  --   ALT 28 29  --   ALKPHOS 159* 146*  --   BILITOT 0.6 0.5  --   PROT 7.1 6.9  --   ALBUMIN 3.1* 3.1* 3.1*    CBG: Recent Labs  Lab 12/07/23 0105 12/07/23 0347 12/07/23 0728 12/07/23 1357 12/07/23 1558  GLUCAP 90 115* 113* 132* 142*     Recent Results (from the past 240 hours)  Resp panel by RT-PCR (RSV, Flu A&B, Covid) Anterior Nasal Swab     Status: None   Collection Time: 12/06/23  6:51 AM   Specimen: Anterior Nasal Swab  Result Value Ref Range Status   SARS Coronavirus 2 by RT PCR NEGATIVE NEGATIVE Final   Influenza A by PCR NEGATIVE NEGATIVE Final   Influenza B by PCR NEGATIVE NEGATIVE Final    Comment: (NOTE) The Xpert Xpress SARS-CoV-2/FLU/RSV plus assay is intended as an aid in the diagnosis  of influenza from Nasopharyngeal swab specimens and should not be used as a sole basis for treatment. Nasal washings and aspirates are unacceptable for Xpert Xpress SARS-CoV-2/FLU/RSV testing.  Fact Sheet for Patients: BloggerCourse.com  Fact Sheet for Healthcare Providers: SeriousBroker.it  This test is not yet approved or cleared by the [place]  FDA and has been authorized for detection and/or diagnosis of SARS-CoV-2 by FDA under an Emergency Use Authorization (EUA). This EUA will remain in effect (meaning this test can be used) for the duration of the COVID-19 declaration under Section 564(b)(1) of the Act, 21 U.S.C. section 360bbb-3(b)(1), unless the authorization is terminated or revoked.     Resp Syncytial Virus by PCR NEGATIVE NEGATIVE Final    Comment: (NOTE) Fact Sheet for Patients: BloggerCourse.com  Fact Sheet for Healthcare Providers: SeriousBroker.it  This test is not yet approved or cleared by the [place]  FDA and has been authorized for detection and/or diagnosis of SARS-CoV-2 by FDA under an Emergency Use Authorization (EUA). This EUA will remain in effect (meaning this test can be used) for the duration of the COVID-19 declaration under Section 564(b)(1) of the Act, 21 U.S.C. section 360bbb-3(b)(1), unless the authorization is terminated or revoked.  Performed at [facility], [address] [place], [place] [address]   Blood Culture (routine x 2)     Status: None (Preliminary result)   Collection Time: 12/06/23  6:51 AM   Specimen: BLOOD  Result Value Ref Range Status   Specimen Description BLOOD RIGHT ANTECUBITAL  Final   Special Requests   Final    BOTTLES DRAWN AEROBIC AND ANAEROBIC Blood Culture results may not be optimal due to an inadequate volume of blood received in culture bottles   Culture   Final    NO GROWTH 1 DAY Performed at  [facility], [address] [place], [place] [address]    Report Status PENDING  Incomplete  Blood Culture (routine x 2)     Status: None (Preliminary result)   Collection Time: 12/07/23  4:08 AM   Specimen: BLOOD RIGHT HAND  Result Value Ref  Range Status   Specimen Description BLOOD RIGHT HAND  Final   Special Requests   Final    BOTTLES DRAWN AEROBIC AND ANAEROBIC Blood Culture results may not be optimal due to an inadequate volume of blood received in culture bottles   Culture   Final    NO GROWTH < 12 HOURS Performed at [facility], [address] [place], [place] [address]    Report Status PENDING  Incomplete         Radiology Studies: CT ABDOMEN PELVIS W CONTRAST Result Date: 12/06/2023 CLINICAL DATA:  Abdominal pain, acute, nonlocalized Sepsis EXAM: CT ABDOMEN AND PELVIS WITH CONTRAST TECHNIQUE: Multidetector CT imaging of the abdomen and pelvis was performed using the standard protocol following bolus administration of intravenous contrast. RADIATION DOSE REDUCTION: This exam was performed according to the departmental dose-optimization program which includes automated exposure control, adjustment of the mA and/or kV according to patient size and/or use of iterative reconstruction technique. CONTRAST:  75mL OMNIPAQUE IOHEXOL 350 MG/ML SOLN COMPARISON:  Noncontrast abdominopelvic CT 09/25/2023 and 09/01/2023. FINDINGS: Lower chest: New small to moderate dependent right pleural effusion with increased right basilar atelectasis. Atelectasis at the left lung base has not significantly changed. Unchanged position of the hemodialysis catheter, near the tricuspid valve. Stable cardiomegaly. Hepatobiliary: The liver is normal in density without suspicious focal abnormality. Cholecystostomy tube remains in place with stable decompression of the gallbladder. Persistent gallbladder wall thickening and small calcified gallstones. No significant biliary dilatation. Pancreas:  Unremarkable. No pancreatic ductal dilatation or surrounding inflammatory changes. Spleen: Normal in size without focal abnormality. Adrenals/Urinary Tract: Both adrenal glands appear normal. No evidence of urinary tract calculus, suspicious renal lesion or hydronephrosis. Mild renal cortical thinning and small renal cysts bilaterally for which no specific follow-up imaging is recommended. The bladder appears unremarkable for its degree of distention. Stomach/Bowel: No enteric contrast administered. Percutaneous gastrostomy tube remains in place. A large amount of stool remains within the rectum with stable mild rectal wall thickening and perirectal soft tissue stranding. No other bowel distension, wall thickening or surrounding inflammation identified. The appendix appears normal. Vascular/Lymphatic: There are no enlarged abdominal or pelvic lymph nodes. Infrarenal IVC filter remains in place. There is asymmetric decreased opacification of the left iliac veins without obvious DVT. Aortic and branch vessel atherosclerosis without evidence of aneurysm or large vessel occlusion. Reproductive: Status post hysterectomy.  No adnexal mass. Other: Increased edema in the right lateral chest wall adjacent to the cholecystostomy tube. No focal fluid collection identified. No ascites or pneumoperitoneum. Musculoskeletal: No acute or significant osseous findings. Mild spondylosis. IMPRESSION: 1. New small to moderate dependent right pleural effusion with increased right basilar atelectasis. 2. Stable position of the cholecystostomy tube with stable decompression of the gallbladder. Persistent gallbladder wall thickening and small calcified gallstones. 3. Increased edema in the right lateral chest wall adjacent to the cholecystostomy tube. No focal fluid collection identified. 4. Stable large amount of stool within the rectum with stable mild rectal wall thickening and perirectal soft tissue stranding, suspicious for stercoral  colitis. 5. Asymmetric decreased opacification of the left iliac veins without obvious DVT. IVC filter in place. 6.  Aortic Atherosclerosis (ICD10-I70.0). Electronically Signed   By: Elmon Hagedorn M.D.   On: 12/06/2023 10:54   DG Chest Port 1 View Result Date: 12/06/2023 CLINICAL DATA:  Questionable sepsis EXAM: PORTABLE CHEST 1 VIEW COMPARISON:  06/16/2023 FINDINGS: Chronic cardiomegaly. Dialysis catheter with tip the lower right atrium, unchanged. Low volume chest with indistinct density  at the right base that is stable and likely scarring. No edema, effusion, or pneumothorax. Catheter over the right upper quadrant, cholecystostomy tube by prior reports IMPRESSION: Stable chest including dialysis catheter with tip at the lower right atrium. Electronically Signed   By: Ronnette Coke M.D.   On: 12/06/2023 07:23        Scheduled Meds:  atorvastatin  10 mg Per Tube QHS   [START ON 12/08/2023] bisacodyl  10 mg Rectal Daily   Chlorhexidine Gluconate Cloth  6 each Topical Q0600   feeding supplement (JEVITY 1.5 CAL/FIBER)  600 mL Per Tube Q24H   insulin aspart  0-6 Units Subcutaneous Q4H   insulin glargine-yfgn  5 Units Subcutaneous QHS   multivitamin  1 tablet Oral QHS   ondansetron (ZOFRAN) IV  4 mg Intravenous Once   polyethylene glycol  17 g Per Tube BID   senna-docusate  1 tablet Per Tube BID   sodium chloride flush  3 mL Intravenous Q12H   Continuous Infusions:  cefTRIAXone (ROCEPHIN)  IV 2 g (12/07/23 0735)   heparin     metronidazole 500 mg (12/07/23 1354)     LOS: 0 days    Time spent: 40 minutes    Hilda Lovings, MD [facility]   To contact the attending provider between 7A-7P or the covering provider during after hours 7P-7A, please log into the web site [email or web address] and access using universal [place] password for that web site. If you do not have the password, please call the hospital operator.  12/07/2023, 6:46 PM

## 2023-12-07 NOTE — Progress Notes (Signed)
 To HD department per transport with monitor.

## 2023-12-07 NOTE — Progress Notes (Signed)
 ANTICOAGULATION CONSULT NOTE  Pharmacy Consult for Heparin Indication:  HX of DVT holding eliquis  Allergies  Allergen Reactions   Asa [Aspirin] Anaphylaxis   Penicillins Anaphylaxis    **Tolerates cephalosporins    Patient Measurements: Height: 4\' 11"  (149.9 cm) Weight: 55.4 kg (122 lb 2.2 oz) IBW/kg (Calculated) : 43.2 Heparin Dosing Weight: 55 kg  Vital Signs: Temp: 98.2 F (36.8 C) (04/15 1557) Temp Source: Oral (04/15 1557) BP: 126/68 (04/15 1557) Pulse Rate: 87 (04/15 1557)  Labs: Recent Labs    12/06/23 0701 12/06/23 0730 12/07/23 0406 12/07/23 0408 12/07/23 0409 12/07/23 1521  HGB 10.7*  --   --  10.0*  --   --   HCT 32.7*  --   --  31.1*  --   --   PLT 149*  --   --  121*  --   --   APTT  --   --   --  64*  --  >200*  LABPROT  --  21.6*  --   --   --   --   INR  --  1.9*  --   --   --   --   HEPARINUNFRC  --   --   --  >1.10*  --   --   CREATININE  --  3.91* 4.09*  --  3.92*  --     Estimated Creatinine Clearance: 9.1 mL/min (A) (by C-G formula based on SCr of 3.92 mg/dL (H)).   Medical History: Past Medical History:  Diagnosis Date   Cerebrovascular accident (CVA) due to occlusion of cerebral artery (HCC) 07/12/2019   CVA (cerebral vascular accident) (HCC)    residual left sided weakness   Dementia (HCC)    Diabetes (HCC)    Diabetes mellitus without complication (HCC)    ESRD (end stage renal disease) on dialysis (HCC)    Heart failure (HCC)    History of CT scan    History of heart attack    History of MRI    History of stroke    HTN (hypertension)    Kidney failure    PEG (percutaneous endoscopic gastrostomy) status (HCC)    Sepsis (HCC) 04/01/2023   SIRS (systemic inflammatory response syndrome) (HCC) 04/06/2023   Thyroid disease     Medications:  Medications Prior to Admission  Medication Sig Dispense Refill Last Dose/Taking   acetaminophen (TYLENOL) 500 MG tablet Take 500 mg by mouth daily as needed for mild pain (pain score  1-3).   12/06/2023 Morning   apixaban (ELIQUIS) 2.5 MG TABS tablet Take 1 tablet (2.5 mg total) by mouth 2 (two) times daily. 180 tablet 2 12/05/2023 at  8:00 PM   atorvastatin (LIPITOR) 10 MG tablet Take 1 tablet (10 mg total) by mouth daily. (Patient taking differently: Take 10 mg by mouth at bedtime.) 90 tablet 1 12/05/2023   carvedilol (COREG) 3.125 MG tablet Take 3.125 mg Q AM  and 3.125 mg Q PM on non dialysis days (Tues, Thurs, Sat and Sun,  - On dialysis days,  MWF, continue Coreg 3.125 mg Q PM -  Hold for SBP < 100 (Patient taking differently: Take 3.125 mg by mouth See admin instructions. Take 3.125 mg by mouth twice daily on non dialysis days. Take 3.125 mg by mouth only in the evening on dialysis days.) 60 tablet 5 12/05/2023   doxycycline (VIBRA-TABS) 100 MG tablet Take 1 tablet (100 mg total) by mouth 2 (two) times daily for 10 days. 20 tablet 0  12/06/2023 Morning   Glucagon (BAQSIMI ONE PACK) 3 MG/DOSE POWD Place 1 packet into the nose as needed (For blood sugar). Use when blood sugar drops below 70.   Unknown   insulin aspart (NOVOLOG) 100 UNIT/ML injection 0-9 Units, Subcutaneous, 3 times daily with meals, CBG < 70: Implement Hypoglycemia measures CBG 70 - 120: 0 units CBG 121 - 150: 1 unit CBG 151 - 200: 2 units CBG 201 - 250: 3 units CBG 251 - 300: 5 units CBG 301 - 350: 7 units CBG 351 - 400: 9 units CBG > 400: call MD (Patient taking differently: Inject 0-10 Units into the skin in the morning, at noon, and at bedtime. BS <70  inject 0 units  BS 70-90 inject 1 unit  BS 91-130 inject 2 units  BS 131-150 inject 3 units  BS 151-200 inject 4 units  BS 201-250 inject 5 units  BS 251-300 inject 6 units  BS 301-350 inject 7 units  BS 351-400 inject 8 units  BS 401-450 inject 9 units  BS > 450 inject 10 units) 10 mL 11 12/05/2023   insulin glargine (LANTUS) 100 UNIT/ML Solostar Pen Inject 15 Units into the skin at bedtime.   12/05/2023   Multiple Vitamins-Minerals (MULTIVITAMIN WITH MINERALS)  tablet Take 1 tablet by mouth at bedtime.   12/05/2023   Nutritional Supplements (FEEDING SUPPLEMENT, OSMOLITE 1.5 CAL,) LIQD Place 600 mLs into feeding tube See admin instructions. 8p-6a   12/05/2023   senna (SENOKOT) 8.6 MG TABS tablet Take 1 tablet (8.6 mg total) by mouth daily. (Patient taking differently: Take 1 tablet by mouth in the morning and at bedtime.) 120 tablet 0 12/05/2023   promethazine (PHENERGAN) 25 MG tablet Take 0.5 tablets (12.5 mg total) by mouth every 6 (six) hours as needed for nausea or vomiting. 30 tablet 0 Unknown   Scheduled:   atorvastatin  10 mg Per Tube QHS   [START ON 12/08/2023] bisacodyl  10 mg Rectal Daily   Chlorhexidine Gluconate Cloth  6 each Topical Q0600   feeding supplement (JEVITY 1.5 CAL/FIBER)  600 mL Per Tube Q24H   insulin aspart  0-6 Units Subcutaneous Q4H   insulin glargine-yfgn  5 Units Subcutaneous QHS   multivitamin  1 tablet Oral QHS   ondansetron (ZOFRAN) IV  4 mg Intravenous Once   polyethylene glycol  17 g Per Tube BID   senna-docusate  1 tablet Per Tube BID   sodium chloride flush  3 mL Intravenous Q12H   Infusions:   cefTRIAXone (ROCEPHIN)  IV 2 g (12/07/23 0735)   heparin 1,100 Units/hr (12/07/23 0556)   metronidazole 500 mg (12/07/23 1354)   PRN: acetaminophen **OR** acetaminophen, ondansetron (ZOFRAN) IV  Assessment: 89 yof with a history of HTN, DM, neuropathy, CVA, PEG tube, cholecystostomy, HF, DVT on eliquis, ESRD, pancytopenia, dementia. Patient is presenting with fever. Heparin per pharmacy consult placed for   HX of DVT holding eliquis in case surgical intervention is needed .  Patient is on apixaban prior to arrival. Last dose 4/13 at 2000. Will require aPTT monitoring due to likely falsely high anti-Xa level secondary to DOAC use.  2nd shift: aPTT returned at > 200. Spoke with phlebotomist who collected the aPTT and she confirmed that it was drawn in opposite arm.   Goal of Therapy:  Heparin level 0.3-0.7 units/ml aPTT  66-102 seconds Monitor platelets by anticoagulation protocol: Yes   Plan:  Hold heparin infusion for 1 hour and then resume at 1000 units/hr Check aPTT &  anti-Xa level in 8 hours and daily while on heparin Continue to monitor via aPTT until levels are correlated Continue to monitor H&H and platelets  Patience Bonito, PharmD, BCPS, BCCCP Clinical Pharmacist

## 2023-12-07 NOTE — Evaluation (Signed)
 Clinical/Bedside Swallow Evaluation Patient Details  Name: Samantha [name] MRN: [id number] Date of Birth: 10-30-1945  Today's Date: 12/07/2023 Time: SLP Start Time (ACUTE ONLY): 1645 SLP Stop Time (ACUTE ONLY): 1659 SLP Time Calculation (min) (ACUTE ONLY): 14 min  Past Medical History:  Past Medical History:  Diagnosis Date   Cerebrovascular accident (CVA) due to occlusion of cerebral artery (HCC) 07/12/2019   CVA (cerebral vascular accident) (HCC)    residual left sided weakness   Dementia (HCC)    Diabetes (HCC)    Diabetes mellitus without complication (HCC)    ESRD (end stage renal disease) on dialysis (HCC)    Heart failure (HCC)    History of CT scan    History of heart attack    History of MRI    History of stroke    HTN (hypertension)    Kidney failure    PEG (percutaneous endoscopic gastrostomy) status (HCC)    Sepsis (HCC) 04/01/2023   SIRS (systemic inflammatory response syndrome) (HCC) 04/06/2023   Thyroid disease    Past Surgical History:  Past Surgical History:  Procedure Laterality Date   ABDOMINAL HYSTERECTOMY     GALLBLADDER SURGERY     IR EXCHANGE BILIARY DRAIN  02/11/2023   IR EXCHANGE BILIARY DRAIN  04/13/2023   IR EXCHANGE BILIARY DRAIN  06/15/2023   IR EXCHANGE BILIARY DRAIN  08/23/2023   IR EXCHANGE BILIARY DRAIN  10/05/2023   IR EXCHANGE BILIARY DRAIN  11/30/2023   IR PERC CHOLECYSTOSTOMY  12/16/2022   IR REPLACE G-TUBE SIMPLE WO FLUORO  08/23/2023   IR REPLC GASTRO/COLONIC TUBE PERCUT W/FLUORO  06/15/2023   IR REPLC GASTRO/COLONIC TUBE PERCUT W/FLUORO  10/05/2023   IR REPLC GASTRO/COLONIC TUBE PERCUT W/FLUORO  11/30/2023   HPI:  Samantha [name] is a 78 yo female presenting to ED 4/14 with fever. CT Abdomen/Pelvis showed R pleural effusion with basilar atelectasis. 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 Most recently seen by SLP 03/2023 with recommendations to continue baseline diet (Dys 3/thin liquids). PMH  includes HTN, T2DM, neuropathy, prior CVA, PEG, cholecystostomy tube, CHF, DVT, ESRD, pancytopenia, dementia    Assessment / Plan / Recommendation  Clinical Impression  Pt states she continues to eat POs with supplemental nutrition and medications provided via G-tube. There are no suspected changes to pt's swallow function per chart review since previous SLP encounter 03/2023. Observed pt with trials of thin liquids and regular solids without overt s/s of dysphagia or aspiration. Recommend she continue baseline diet of Dys 3 solids with thin liquids. Ongoing SLP f/u is not clinically indicated at this time. SLP Visit Diagnosis: Dysphagia, unspecified (R13.10)    Aspiration Risk  Mild aspiration risk    Diet Recommendation Dysphagia 3 (Mech soft);Thin liquid    Liquid Administration via: Cup;Straw Medication Administration: Via alternative means Supervision: Patient able to self feed;Intermittent supervision to cue for compensatory strategies Compensations: Minimize environmental distractions;Slow rate;Small sips/bites Postural Changes: Seated upright at 90 degrees    Other  Recommendations Oral Care Recommendations: Oral care BID    Recommendations for follow up therapy are one component of a multi-disciplinary discharge planning process, led by the attending physician.  Recommendations may be updated based on patient status, additional functional criteria and insurance authorization.  Follow up Recommendations No SLP follow up      Assistance Recommended at Discharge    Functional Status Assessment Patient has not had a recent decline in their functional status  Frequency and Duration  Prognosis Prognosis for improved oropharyngeal function: Good Barriers to Reach Goals: Cognitive deficits;Time post onset      Swallow Study   General HPI: Samantha [name] is a 78 yo female presenting to ED 4/14 with fever. CT Abdomen/Pelvis showed R pleural effusion with basilar  atelectasis. 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 Most recently seen by SLP 03/2023 with recommendations to continue baseline diet (Dys 3/thin liquids). PMH includes HTN, T2DM, neuropathy, prior CVA, PEG, cholecystostomy tube, CHF, DVT, ESRD, pancytopenia, dementia Type of Study: Bedside Swallow Evaluation Previous Swallow Assessment: see HPI Diet Prior to this Study: Regular;Thin liquids (Level 0) Temperature Spikes Noted: Yes Respiratory Status: Room air History of Recent Intubation: No Behavior/Cognition: Alert;Cooperative Oral Cavity Assessment: Within Functional Limits Oral Care Completed by SLP: No Oral Cavity - Dentition: Poor condition;Missing dentition Vision: Functional for self-feeding Self-Feeding Abilities: Able to feed self Patient Positioning: Upright in bed Baseline Vocal Quality: Normal Volitional Cough: Cognitively unable to elicit Volitional Swallow: Able to elicit    Oral/Motor/Sensory Function Overall Oral Motor/Sensory Function: Within functional limits   Ice Chips Ice chips: Not tested   Thin Liquid Thin Liquid: Within functional limits Presentation: Straw;Self Fed    Nectar Thick Nectar Thick Liquid: Not tested   Honey Thick Honey Thick Liquid: Not tested   Puree Puree: Not tested   Solid     Solid: Within functional limits Presentation: Self Fed      Amil Kale, M.A., CCC-SLP Speech Language Pathology, Acute Rehabilitation Services  Secure Chat preferred [phone]  12/07/2023,5:10 PM

## 2023-12-07 NOTE — Progress Notes (Signed)
 [facility] Progress Note   Subjective:seen on HD. Oriented to self only. BP soft. Running even.      Objective Vitals:   12/07/23 1132 12/07/23 1200 12/07/23 1230 12/07/23 1235  BP: 98/68 (!) 104/51 92/64 (!) 112/59  Pulse: (!) 105 (!) 102 (!) 104 97  Resp: (!) 25 (!) 21 (!) 24 (!) 24  Temp:      TempSrc:      SpO2: 96% 98% 94% 94%  Weight:      Height:       Physical Exam General:Chronically ill appearing female in NAD Heart: S1,S2 RRR No M/R/G Lungs: CTAB Abdomen: NABS, NT Extremities: No LE edema Dialysis Access: RIJ TDC drsg intact    Additional Objective Labs: Basic Metabolic Panel: Recent Labs  Lab 12/06/23 0730 12/07/23 0406 12/07/23 0408 12/07/23 0409  NA 130* 135  --  135  K 5.0 5.5*  --  5.5*  CL 91* 97*  --  97*  CO2 29 24  --  25  GLUCOSE 236* 113*  --  114*  BUN 31* 31*  --  31*  CREATININE 3.91* 4.09*  --  3.92*  CALCIUM 9.2 9.1  --  9.1  PHOS  --   --  3.8 3.9   Liver Function Tests: Recent Labs  Lab 12/06/23 0730 12/07/23 0406 12/07/23 0409  AST 30 33  --   ALT 28 29  --   ALKPHOS 159* 146*  --   BILITOT 0.6 0.5  --   PROT 7.1 6.9  --   ALBUMIN 3.1* 3.1* 3.1*   No results for input(s): "LIPASE", "AMYLASE" in the last 168 hours. CBC: Recent Labs  Lab 12/06/23 0701 12/07/23 0408  WBC 9.0 5.9  NEUTROABS 5.4  --   HGB 10.7* 10.0*  HCT 32.7* 31.1*  MCV 92.4 91.7  PLT 149* 121*   Blood Culture    Component Value Date/Time   SDES BLOOD RIGHT HAND 12/07/2023 0408   SPECREQUEST  12/07/2023 0408    BOTTLES DRAWN AEROBIC AND ANAEROBIC Blood Culture results may not be optimal due to an inadequate volume of blood received in culture bottles   CULT  12/07/2023 0408    NO GROWTH < 12 HOURS Performed at [facility], [address] [place], [place] [address]    REPTSTATUS PENDING 12/07/2023 0408    Cardiac Enzymes: No results for input(s): "CKTOTAL", "CKMB", "CKMBINDEX", "TROPONINI" in the last 168  hours. CBG: Recent Labs  Lab 12/06/23 2020 12/06/23 2051 12/07/23 0105 12/07/23 0347 12/07/23 0728  GLUCAP 51* 215* 90 115* 113*   Iron Studies: No results for input(s): "IRON", "TIBC", "TRANSFERRIN", "FERRITIN" in the last 72 hours. @lablastinr3 @ Studies/Results: CT ABDOMEN PELVIS W CONTRAST Result Date: 12/06/2023 CLINICAL DATA:  Abdominal pain, acute, nonlocalized Sepsis EXAM: CT ABDOMEN AND PELVIS WITH CONTRAST TECHNIQUE: Multidetector CT imaging of the abdomen and pelvis was performed using the standard protocol following bolus administration of intravenous contrast. RADIATION DOSE REDUCTION: This exam was performed according to the departmental dose-optimization program which includes automated exposure control, adjustment of the mA and/or kV according to patient size and/or use of iterative reconstruction technique. CONTRAST:  75mL OMNIPAQUE IOHEXOL 350 MG/ML SOLN COMPARISON:  Noncontrast abdominopelvic CT 09/25/2023 and 09/01/2023. FINDINGS: Lower chest: New small to moderate dependent right pleural effusion with increased right basilar atelectasis. Atelectasis at the left lung base has not significantly changed. Unchanged position of the hemodialysis catheter, near the tricuspid valve. Stable cardiomegaly. Hepatobiliary: The liver is  normal in density without suspicious focal abnormality. Cholecystostomy tube remains in place with stable decompression of the gallbladder. Persistent gallbladder wall thickening and small calcified gallstones. No significant biliary dilatation. Pancreas: Unremarkable. No pancreatic ductal dilatation or surrounding inflammatory changes. Spleen: Normal in size without focal abnormality. Adrenals/Urinary Tract: Both adrenal glands appear normal. No evidence of urinary tract calculus, suspicious renal lesion or hydronephrosis. Mild renal cortical thinning and small renal cysts bilaterally for which no specific follow-up imaging is recommended. The bladder appears  unremarkable for its degree of distention. Stomach/Bowel: No enteric contrast administered. Percutaneous gastrostomy tube remains in place. A large amount of stool remains within the rectum with stable mild rectal wall thickening and perirectal soft tissue stranding. No other bowel distension, wall thickening or surrounding inflammation identified. The appendix appears normal. Vascular/Lymphatic: There are no enlarged abdominal or pelvic lymph nodes. Infrarenal IVC filter remains in place. There is asymmetric decreased opacification of the left iliac veins without obvious DVT. Aortic and branch vessel atherosclerosis without evidence of aneurysm or large vessel occlusion. Reproductive: Status post hysterectomy.  No adnexal mass. Other: Increased edema in the right lateral chest wall adjacent to the cholecystostomy tube. No focal fluid collection identified. No ascites or pneumoperitoneum. Musculoskeletal: No acute or significant osseous findings. Mild spondylosis. IMPRESSION: 1. New small to moderate dependent right pleural effusion with increased right basilar atelectasis. 2. Stable position of the cholecystostomy tube with stable decompression of the gallbladder. Persistent gallbladder wall thickening and small calcified gallstones. 3. Increased edema in the right lateral chest wall adjacent to the cholecystostomy tube. No focal fluid collection identified. 4. Stable large amount of stool within the rectum with stable mild rectal wall thickening and perirectal soft tissue stranding, suspicious for stercoral colitis. 5. Asymmetric decreased opacification of the left iliac veins without obvious DVT. IVC filter in place. 6.  Aortic Atherosclerosis (ICD10-I70.0). Electronically Signed   By: Elmon Hagedorn M.D.   On: 12/06/2023 10:54   DG Chest Port 1 View Result Date: 12/06/2023 CLINICAL DATA:  Questionable sepsis EXAM: PORTABLE CHEST 1 VIEW COMPARISON:  06/16/2023 FINDINGS: Chronic cardiomegaly. Dialysis catheter  with tip the lower right atrium, unchanged. Low volume chest with indistinct density at the right base that is stable and likely scarring. No edema, effusion, or pneumothorax. Catheter over the right upper quadrant, cholecystostomy tube by prior reports IMPRESSION: Stable chest including dialysis catheter with tip at the lower right atrium. Electronically Signed   By: Ronnette Coke M.D.   On: 12/06/2023 07:23   Medications:  anticoagulant sodium citrate     cefTRIAXone (ROCEPHIN)  IV 2 g (12/07/23 0735)   heparin 1,100 Units/hr (12/07/23 0556)   metronidazole Stopped (12/07/23 0216)    atorvastatin  10 mg Per Tube QHS   [START ON 12/08/2023] bisacodyl  10 mg Rectal Daily   Chlorhexidine Gluconate Cloth  6 each Topical Q0600   feeding supplement (OSMOLITE 1.2 CAL)  1,000 mL Per Tube Q24H   heparin sodium (porcine)  4,500 Units Intracatheter Once   insulin aspart  0-6 Units Subcutaneous Q4H   insulin glargine-yfgn  5 Units Subcutaneous QHS   ondansetron (ZOFRAN) IV  4 mg Intravenous Once   polyethylene glycol  17 g Per Tube BID   senna-docusate  1 tablet Oral BID   sodium chloride flush  3 mL Intravenous Q12H     OP HD: MWF SW 3h  B350   57.2kg   2K bath  RIJ TDC  Heparin 2500 + 1500 midrun Last HD  4/11, post HD wt 58.0kg Mircera 50 mcg q 2 wks IV     Assessment/ Plan: Fever/ stercoral colitis: CT w/ stranding around the rectum. Getting IV abx per primary team, and gentle IVF's. GI consulted.   Labial abscesses: draining in ED per notes. Per pmd.  ESRD: on HD MWF. HD today off schedule. Next HD 12/08/2023 short tx to resume MWF schedule.  HTN: BP's wnl to low. Holding home coreg. Follow.  Volume: close to dry wt after last HD on 4/11. No vol ^ on exam, on room air. Small UF next HD.  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  Secondary hyperparathyroidism: CCa in range, add on phos. Cont binders if/when eating.  H/o DVT: eliquis --> change to IV heparin per primary team  Rylen Hou  H. Tawana Pasch NP-C 12/07/2023, 12:39 PM  [facility] [employer] [phone]

## 2023-12-07 NOTE — Progress Notes (Signed)
 Returns to room S/P HD

## 2023-12-08 DIAGNOSIS — N764 Abscess of vulva: Secondary | ICD-10-CM | POA: Diagnosis not present

## 2023-12-08 DIAGNOSIS — K5909 Other constipation: Secondary | ICD-10-CM | POA: Diagnosis not present

## 2023-12-08 DIAGNOSIS — R933 Abnormal findings on diagnostic imaging of other parts of digestive tract: Secondary | ICD-10-CM | POA: Diagnosis not present

## 2023-12-08 DIAGNOSIS — I5020 Unspecified systolic (congestive) heart failure: Secondary | ICD-10-CM | POA: Diagnosis not present

## 2023-12-08 DIAGNOSIS — K5289 Other specified noninfective gastroenteritis and colitis: Secondary | ICD-10-CM | POA: Diagnosis not present

## 2023-12-08 DIAGNOSIS — I6930 Unspecified sequelae of cerebral infarction: Secondary | ICD-10-CM | POA: Diagnosis not present

## 2023-12-08 DIAGNOSIS — Z86718 Personal history of other venous thrombosis and embolism: Secondary | ICD-10-CM | POA: Diagnosis not present

## 2023-12-08 LAB — GLUCOSE, CAPILLARY
Glucose-Capillary: 265 mg/dL — ABNORMAL HIGH (ref 70–99)
Glucose-Capillary: 272 mg/dL — ABNORMAL HIGH (ref 70–99)
Glucose-Capillary: 112 mg/dL — ABNORMAL HIGH (ref 70–99)

## 2023-12-08 LAB — CBC WITH DIFFERENTIAL/PLATELET
Abs Immature Granulocytes: 0.05 10*3/uL (ref 0.00–0.07)
Basophils Absolute: 0 10*3/uL (ref 0.0–0.1)
Basophils Relative: 1 %
Eosinophils Absolute: 0.2 10*3/uL (ref 0.0–0.5)
Eosinophils Relative: 3 %
HCT: 26.6 % — ABNORMAL LOW (ref 36.0–46.0)
Hemoglobin: 8.6 g/dL — ABNORMAL LOW (ref 12.0–15.0)
Immature Granulocytes: 1 %
Lymphocytes Relative: 26 %
Lymphs Abs: 1.6 10*3/uL (ref 0.7–4.0)
MCH: 29.3 pg (ref 26.0–34.0)
MCHC: 32.3 g/dL (ref 30.0–36.0)
MCV: 90.5 fL (ref 80.0–100.0)
Monocytes Absolute: 0.6 10*3/uL (ref 0.1–1.0)
Monocytes Relative: 10 %
Neutro Abs: 3.7 10*3/uL (ref 1.7–7.7)
Neutrophils Relative %: 59 %
Platelets: 138 10*3/uL — ABNORMAL LOW (ref 150–400)
RBC: 2.94 MIL/uL — ABNORMAL LOW (ref 3.87–5.11)
RDW: 16.1 % — ABNORMAL HIGH (ref 11.5–15.5)
WBC: 6.2 10*3/uL (ref 4.0–10.5)
nRBC: 0 % (ref 0.0–0.2)

## 2023-12-08 LAB — RENAL FUNCTION PANEL
Albumin: 2.7 g/dL — ABNORMAL LOW (ref 3.5–5.0)
Anion gap: 9 (ref 5–15)
BUN: 13 mg/dL (ref 8–23)
CO2: 27 mmol/L (ref 22–32)
Calcium: 8.9 mg/dL (ref 8.9–10.3)
Chloride: 95 mmol/L — ABNORMAL LOW (ref 98–111)
Creatinine, Ser: 2.48 mg/dL — ABNORMAL HIGH (ref 0.44–1.00)
GFR, Estimated: 20 mL/min — ABNORMAL LOW (ref >60)
Glucose, Bld: 278 mg/dL — ABNORMAL HIGH (ref 70–99)
Phosphorus: 3.4 mg/dL (ref 2.5–4.6)
Potassium: 4.2 mmol/L (ref 3.5–5.1)
Sodium: 131 mmol/L — ABNORMAL LOW (ref 135–145)

## 2023-12-08 LAB — APTT: aPTT: >200 s (ref 24–36)

## 2023-12-08 LAB — TSH: TSH: 3.99 u[IU]/mL (ref 0.350–4.500)

## 2023-12-08 LAB — HEPARIN LEVEL (UNFRACTIONATED): Heparin Unfractionated: >1.1 [IU]/mL — ABNORMAL HIGH (ref 0.30–0.70)

## 2023-12-08 LAB — HEPATITIS B SURFACE ANTIBODY, QUANTITATIVE: Hep B S AB Quant (Post): 256 m[IU]/mL

## 2023-12-08 MED ORDER — HEPARIN (PORCINE) 25000 UT/250ML-% IV SOLN
800.0000 [IU]/h | INTRAVENOUS | Status: DC
Start: 2023-12-08 — End: 2023-12-08
  Administered 2023-12-08: 800 [IU]/h via INTRAVENOUS

## 2023-12-08 MED ORDER — INSULIN GLARGINE-YFGN 100 UNIT/ML ~~LOC~~ SOLN
8.0000 [IU] | Freq: Every day | SUBCUTANEOUS | Status: DC
  Administered 2023-12-08: 8 [IU] via SUBCUTANEOUS
  Filled 2023-12-08 (×3): qty 0.08

## 2023-12-08 MED ORDER — INSULIN ASPART 100 UNIT/ML IJ SOLN: 0.0000 [IU] | Freq: Every day | INTRAMUSCULAR | Status: DC

## 2023-12-08 MED ORDER — HEPARIN (PORCINE) 25000 UT/250ML-% IV SOLN: 800.0000 [IU]/h | INTRAVENOUS | Status: AC

## 2023-12-08 MED ORDER — CARVEDILOL 3.125 MG PO TABS
3.1250 mg | ORAL_TABLET | Freq: Two times a day (BID) | ORAL | Status: DC
  Administered 2023-12-09 (×2): 3.125 mg via ORAL
  Filled 2023-12-08 (×2): qty 1

## 2023-12-08 MED ORDER — INSULIN ASPART 100 UNIT/ML IJ SOLN
0.0000 [IU] | Freq: Three times a day (TID) | INTRAMUSCULAR | Status: DC
  Administered 2023-12-09: 7 [IU] via SUBCUTANEOUS
  Administered 2023-12-09: 1 [IU] via SUBCUTANEOUS

## 2023-12-08 MED ORDER — DARBEPOETIN ALFA 100 MCG/0.5ML IJ SOSY
100.0000 ug | PREFILLED_SYRINGE | INTRAMUSCULAR | Status: DC
Start: 2023-12-08 — End: 2023-12-10
  Administered 2023-12-08: 100 ug via SUBCUTANEOUS
  Filled 2023-12-08: qty 0.5

## 2023-12-08 MED ORDER — APIXABAN 2.5 MG PO TABS
2.5000 mg | ORAL_TABLET | Freq: Two times a day (BID) | ORAL | Status: DC
  Administered 2023-12-08 – 2023-12-09 (×3): 2.5 mg via ORAL
  Filled 2023-12-08 (×3): qty 1

## 2023-12-08 MED ORDER — HEPARIN SODIUM (PORCINE) 1000 UNIT/ML IJ SOLN
4500.0000 [IU] | Freq: Once | INTRAMUSCULAR | Status: AC
  Administered 2023-12-08: 4500 [IU]
  Filled 2023-12-08: qty 4.5

## 2023-12-08 NOTE — Progress Notes (Signed)
 [facility] Progress Note   Subjective:     Objective Vitals:   12/08/23 1000 12/08/23 1030 12/08/23 1100 12/08/23 1130  BP: (!) 146/90 123/71 135/76 130/73  Pulse: 94 89 92 93  Resp: (!) 21 (!) 21 (!) 27 (!) 24  Temp:      TempSrc:      SpO2: 100% 100% 100% 100%  Weight:      Height:       Physical Exam General: Heart: Lungs: Abdomen: Extremities: Dialysis Access:   Dialysis Orders:  Additional Objective Labs: Basic Metabolic Panel: Recent Labs  Lab 12/07/23 0406 12/07/23 0408 12/07/23 0409 12/08/23 0357  NA 135  --  135 131*  K 5.5*  --  5.5* 4.2  CL 97*  --  97* 95*  CO2 24  --  25 27  GLUCOSE 113*  --  114* 278*  BUN 31*  --  31* 13  CREATININE 4.09*  --  3.92* 2.48*  CALCIUM 9.1  --  9.1 8.9  PHOS  --  3.8 3.9 3.4   Liver Function Tests: Recent Labs  Lab 12/06/23 0730 12/07/23 0406 12/07/23 0409 12/08/23 0357  AST 30 33  --   --   ALT 28 29  --   --   ALKPHOS 159* 146*  --   --   BILITOT 0.6 0.5  --   --   PROT 7.1 6.9  --   --   ALBUMIN 3.1* 3.1* 3.1* 2.7*   No results for input(s): "LIPASE", "AMYLASE" in the last 168 hours. CBC: Recent Labs  Lab 12/06/23 0701 12/07/23 0408 12/08/23 0357  WBC 9.0 5.9 6.2  NEUTROABS 5.4  --  3.7  HGB 10.7* 10.0* 8.6*  HCT 32.7* 31.1* 26.6*  MCV 92.4 91.7 90.5  PLT 149* 121* 138*   Blood Culture    Component Value Date/Time   SDES BLOOD RIGHT HAND 12/07/2023 0408   SPECREQUEST  12/07/2023 0408    BOTTLES DRAWN AEROBIC AND ANAEROBIC Blood Culture results may not be optimal due to an inadequate volume of blood received in culture bottles   CULT  12/07/2023 0408    NO GROWTH 1 DAY Performed at [facility], [address], [place], [place] [address]    REPTSTATUS PENDING 12/07/2023 0408    Cardiac Enzymes: No results for input(s): "CKTOTAL", "CKMB", "CKMBINDEX", "TROPONINI" in the last 168 hours. CBG: Recent Labs  Lab 12/07/23 1558 12/07/23 2007 12/07/23 2330  12/08/23 0354 12/08/23 0725  GLUCAP 142* 137* 149* 265* 272*   Iron Studies: No results for input(s): "IRON", "TIBC", "TRANSFERRIN", "FERRITIN" in the last 72 hours. @lablastinr3 @ Studies/Results: No results found. Medications:  cefTRIAXone (ROCEPHIN)  IV 2 g (12/08/23 0858)   heparin 800 Units/hr (12/08/23 1914)   metronidazole Stopped (12/07/23 2336)    atorvastatin  10 mg Per Tube QHS   bisacodyl  10 mg Rectal Daily   Chlorhexidine Gluconate Cloth  6 each Topical Q0600   feeding supplement (JEVITY 1.5 CAL/FIBER)  600 mL Per Tube Q24H   heparin sodium (porcine)  4,500 Units Intracatheter Once   insulin aspart  0-6 Units Subcutaneous Q4H   insulin glargine-yfgn  5 Units Subcutaneous QHS   multivitamin  1 tablet Oral QHS   ondansetron (ZOFRAN) IV  4 mg Intravenous Once   polyethylene glycol  17 g Per Tube BID   senna-docusate  1 tablet Per Tube BID   sodium chloride flush  3 mL Intravenous Q12H  OP HD: MWF SW 3h  B350   57.2kg   2K bath  RIJ TDC  Heparin 2500 + 1500 midrun Last HD 4/11, post HD wt 58.0kg Mircera 50 mcg q 2 wks IV     Assessment/ Plan: Fever/ stercoral colitis: CT w/ stranding around the rectum. Getting IV abx per primary team, and gentle IVF's. GI consulted.   Labial abscesses: draining in ED per notes. Per pmd.  ESRD: on HD MWF. HD today on schedule. Next HD 12/10/2023.  HTN: BP's wnl to low. Holding home coreg. Follow.  Volume: close to dry wt after last HD on 4/11. No vol ^ on exam, on room air. Small UF next HD.  Anemia of esrd: HGB 8.6 today. No ESA given since 11/01/2023. Will order today.  Secondary hyperparathyroidism: Corrected Ca+ 8.9 Po4 at goal. No binders or VDRA.  H/o DVT: eliquis --> change to IV heparin per primary team    Jentzen Minasyan H. Suleima Ohlendorf NP-C 12/08/2023, 11:56 AM  [employer] [employer] [phone]

## 2023-12-08 NOTE — Progress Notes (Signed)
 ANTICOAGULATION CONSULT NOTE  Pharmacy Consult for Heparin Indication:  HX of DVT holding eliquis  Allergies  Allergen Reactions   Asa [Aspirin] Anaphylaxis   Penicillins Anaphylaxis    **Tolerates cephalosporins    Patient Measurements: Height: 4\' 11"  (149.9 cm) Weight: 60.1 kg (132 lb 7.9 oz) IBW/kg (Calculated) : 43.2 Heparin Dosing Weight: 55 kg  Vital Signs: Temp: 98.4 F (36.9 C) (04/16 0400) Temp Source: Oral (04/16 0400) BP: 111/53 (04/16 0400) Pulse Rate: 79 (04/16 0400)  Labs: Recent Labs    12/06/23 0701 12/06/23 0730 12/06/23 0730 12/07/23 0406 12/07/23 0408 12/07/23 0409 12/07/23 1521 12/08/23 0357  HGB 10.7*  --   --   --  10.0*  --   --  8.6*  HCT 32.7*  --   --   --  31.1*  --   --  26.6*  PLT 149*  --   --   --  121*  --   --  138*  APTT  --   --   --   --  64*  --  >200* >200*  LABPROT  --  21.6*  --   --   --   --   --   --   INR  --  1.9*  --   --   --   --   --   --   HEPARINUNFRC  --   --   --   --  >1.10*  --   --  >1.10*  CREATININE  --  3.91*   < > 4.09*  --  3.92*  --  2.48*   < > = values in this interval not displayed.    Estimated Creatinine Clearance: 15 mL/min (A) (by C-G formula based on SCr of 2.48 mg/dL (H)).  Assessment: 66 yof with a history of HTN, DM, neuropathy, CVA, PEG tube, cholecystostomy, HF, DVT on eliquis, ESRD, pancytopenia, dementia. Patient is presenting with fever. Heparin per pharmacy consult placed for   HX of DVT holding eliquis in case surgical intervention is needed .  Patient is on apixaban prior to arrival. Last dose 4/13 at 2000. Will require aPTT monitoring due to likely falsely high anti-Xa level secondary to DOAC use.  2nd shift: aPTT returned at > 200. Per RN drawn correctly from opposite extremity that heparin is running. No signs/symptoms of bleeding. Hgb 10 >8.6 and plts 138.   Goal of Therapy:  Heparin level 0.3-0.7 units/ml aPTT 66-102 seconds Monitor platelets by anticoagulation protocol:  Yes   Plan:  Hold heparin infusion for 1 hour and then resume at 800 units/hr Check aPTT in 8h Continue to monitor via aPTT until levels are correlated Continue to monitor H&H and platelets  Young Hensen, PharmD. Clinical Pharmacist 12/08/2023 5:20 AM

## 2023-12-08 NOTE — Progress Notes (Signed)
 PROGRESS NOTE  Saadia Salas ZOX:[id number] DOB: 14-Nov-1945   PCP: [name], MD  Patient is from: Home.  Lives with family.  Totally dependent  DOA: 12/06/2023 LOS: 1  Chief complaints Chief Complaint  Patient presents with   Fever     Brief Narrative / Interim history: 78 year old F with PMH of dementia, ESRD on HD MWF,HFmrEF, cholecystostomy, DVT s/p IVC filter, DM-2, CVA and dysphagia on tube feed via PEG brought to the ED with fever, nausea and vomiting, and admitted with working diagnosis of sterile coral colitis/fever and possible labial abscess.  CT abdomen and pelvis done noted a moderate right pleural effusion, stable cholecystostomy tube with persistent gallbladder wall thickening and a decompressed gallbladder, increased edema of the chest wall at the cholecystostomy tube site, large stool noted compared to previous at the rectum with wall thickening and stranding consistent with stercoral colitis. Patient placed empirically started on ceftriaxone and Flagyl.  Nephrology and GI consulted  Per GI, she declined colonoscopy.  On a scheduled bowel regimen for constipation.  Labial abscess seems to be resolving.  Likely discharge on oral antibiotics in the next 24 to 48 hours.   Subjective: Seen and examined this afternoon.  No major events overnight of this morning.  Patient's daughter at bedside.  Patient has no complaints but not a great historian.  Patient had a large bowel movement yesterday.  Objective: Vitals:   12/08/23 1211 12/08/23 1217 12/08/23 1220 12/08/23 1659  BP: (!) 93/58 (!) 93/58 108/66 137/80  Pulse: 90 89 88 88  Resp: 11 20 17 17   Temp:   98 F (36.7 C) 98.5 F (36.9 C)  TempSrc:    Oral  SpO2: 100% 100% 99%   Weight:   55.8 kg   Height:        Examination:  GENERAL: No apparent distress.  Nontoxic. HEENT: MMM.  Vision and hearing grossly intact.  NECK: Supple.  No apparent JVD.  RESP:  No IWOB.  Fair aeration bilaterally. CVS:   RRR. Heart sounds normal.  ABD/GI/GU: BS+. Abd soft, NTND.  MSK/EXT: BLE paresis/contracture. SKIN: no apparent skin lesion or wound NEURO: Awake and alert.  Oriented to self, place and family.  No apparent focal neuro deficit. PSYCH: Calm. Normal affect.   GU exam: Small opening of skin over right labia.  No apparent drainage.  No underlying fluctuance.  No tenderness or erythema.  Small whitish milky discharge from vagina  Irfat Habib, RN and patient's daughter present as chaperone during exam  Consultants:  Nephrology Gastroenterology  Procedures: Hemodialysis  Microbiology summarized: COVID-19, influenza and RSV PCR nonreactive Blood cultures NGTD  Assessment and plan: Stercoral colitis: Presents with fever, nausea and vomiting. CT abdomen and pelvis with stable gallbladder wall thickening and decompressed gallbladder with stable cholecystostomy tube placement.  Some edema noted to chest wall at tube site.  Large stool burden in the rectum with wall thickening and fat stranding consistent with stercoral colitis. -Continue ceftriaxone and Flagyl today.  Will transition to p.o. Augmentin on discharge -Per GI, patient declined colonoscopy.  Continue bowel regimen   Labial abscess?  Reportedly draining in ED. No draining abscess on my exam. -Antibiotics as above   IDDM-2 with hyperglycemia: A1c 6.6%. Recent Labs  Lab 12/07/23 1558 12/07/23 2007 12/07/23 2330 12/08/23 0354 12/08/23 0725  GLUCAP 142* 137* 149* 265* 272*  - Increase Semglee to 8 units at bedtime -Increase SSI to sensitive -Further adjustment as appropriate   ChronicHFmrEF: TTE in 2024 with LVEF of  40 to 45%, indeterminate DD and normal RV function.  Appears euvolemic on exam. -Fluid management per dialysis -Resume home Coreg  Essential hypertension: Normotensive for most part. - Resume home Coreg with holding parameters   Dementia without behavioral disturbance: Currently at baseline alert and oriented  x 3.  Lives with his daughters.  Totally dependent. -Reorientation and delirium precaution  Anemia of renal disease: Slight drop in Hgb but no overt bleeding. Recent Labs    06/16/23 2226 09/01/23 1954 09/25/23 0206 09/25/23 1239 09/26/23 0605 09/27/23 0406 10/19/23 1106 12/06/23 0701 12/07/23 0408 12/08/23 0357  HGB 11.2* 11.9* 9.6* 10.1* 9.0* 8.2* 10.8* 10.7* 10.0* 8.6*  - Monitor    History of DVT-was on IV heparin in case she needed procedure -Transition back to home Eliquis  History of CVA/dysphagia -Dysphagia 3 diet -Tube feed per PEG at night   ESRD, MWF - Dialysis per nephrology  Hyponatremia: Likely due to ESRD - Continue monitoring  Nutrition: Body mass index is 24.85 kg/m. Nutrition Problem: Altered GI function Etiology: constipation Signs/Symptoms: other (comment) (CT abdomen results) Interventions: Tube feeding, MVI  Pressure skin injury: Present on arrival Pressure Injury 12/07/23 Coccyx Proximal;Medial Stage 2 -  Partial thickness loss of dermis presenting as a shallow open injury with a red, pink wound bed without slough. (Active)  12/07/23 0730  Location: Coccyx  Location Orientation: Proximal;Medial  Staging: Stage 2 -  Partial thickness loss of dermis presenting as a shallow open injury with a red, pink wound bed without slough.  Wound Description (Comments):   Present on Admission: Yes  Dressing Type Foam - Lift dressing to assess site every shift 12/07/23 2024   DVT prophylaxis:  apixaban (ELIQUIS) tablet 2.5 mg Start: 12/08/23 1800 apixaban (ELIQUIS) tablet 2.5 mg  Code Status: Full code-confirmed with daughter Family Communication: Updated patient's daughter at bedside Level of care: Progressive Status is: Inpatient Remains inpatient appropriate because: Stercoral colitis/possible labial abscess   Final disposition: Home   55 minutes with more than 50% spent in reviewing records, counseling patient/family and coordinating  care.   Sch Meds:  Scheduled Meds:  apixaban  2.5 mg Oral BID   atorvastatin  10 mg Per Tube QHS   bisacodyl  10 mg Rectal Daily   Chlorhexidine Gluconate Cloth  6 each Topical Q0600   darbepoetin (ARANESP) injection - DIALYSIS  100 mcg Subcutaneous Q Wed-1800   feeding supplement (JEVITY 1.5 CAL/FIBER)  600 mL Per Tube Q24H   insulin aspart  0-6 Units Subcutaneous Q4H   insulin glargine-yfgn  5 Units Subcutaneous QHS   multivitamin  1 tablet Oral QHS   ondansetron (ZOFRAN) IV  4 mg Intravenous Once   polyethylene glycol  17 g Per Tube BID   senna-docusate  1 tablet Per Tube BID   sodium chloride flush  3 mL Intravenous Q12H   Continuous Infusions:  cefTRIAXone (ROCEPHIN)  IV Stopped (12/08/23 0930)   heparin 800 Units/hr (12/08/23 1625)   metronidazole 500 mg (12/08/23 1624)   PRN Meds:.acetaminophen **OR** acetaminophen, ondansetron (ZOFRAN) IV  Antimicrobials: Anti-infectives (From admission, onward)    Start     Dose/Rate Route Frequency Ordered Stop   12/07/23 0800  cefTRIAXone (ROCEPHIN) 2 g in sodium chloride 0.9 % 100 mL IVPB        2 g 200 mL/hr over 30 Minutes Intravenous Every 24 hours 12/06/23 1245     12/06/23 2200  metroNIDAZOLE (FLAGYL) IVPB 500 mg        500 mg 100 mL/hr  over 60 Minutes Intravenous Every 12 hours 12/06/23 1245     12/06/23 1200  metroNIDAZOLE (FLAGYL) IVPB 500 mg        500 mg 100 mL/hr over 60 Minutes Intravenous  Once 12/06/23 1152 12/06/23 1311   12/06/23 0815  cefTRIAXone (ROCEPHIN) 2 g in sodium chloride 0.9 % 100 mL IVPB        2 g 200 mL/hr over 30 Minutes Intravenous  Once 12/06/23 0807 12/06/23 0903        I have personally reviewed the following labs and images: CBC: Recent Labs  Lab 12/06/23 0701 12/07/23 0408 12/08/23 0357  WBC 9.0 5.9 6.2  NEUTROABS 5.4  --  3.7  HGB 10.7* 10.0* 8.6*  HCT 32.7* 31.1* 26.6*  MCV 92.4 91.7 90.5  PLT 149* 121* 138*   BMP &GFR Recent Labs  Lab 12/06/23 0730 12/07/23 0406  12/07/23 0408 12/07/23 0409 12/08/23 0357  NA 130* 135  --  135 131*  K 5.0 5.5*  --  5.5* 4.2  CL 91* 97*  --  97* 95*  CO2 29 24  --  25 27  GLUCOSE 236* 113*  --  114* 278*  BUN 31* 31*  --  31* 13  CREATININE 3.91* 4.09*  --  3.92* 2.48*  CALCIUM 9.2 9.1  --  9.1 8.9  PHOS  --   --  3.8 3.9 3.4   Estimated Creatinine Clearance: 14.5 mL/min (A) (by C-G formula based on SCr of 2.48 mg/dL (H)). Liver & Pancreas: Recent Labs  Lab 12/06/23 0730 12/07/23 0406 12/07/23 0409 12/08/23 0357  AST 30 33  --   --   ALT 28 29  --   --   ALKPHOS 159* 146*  --   --   BILITOT 0.6 0.5  --   --   PROT 7.1 6.9  --   --   ALBUMIN 3.1* 3.1* 3.1* 2.7*   No results for input(s): "LIPASE", "AMYLASE" in the last 168 hours. No results for input(s): "AMMONIA" in the last 168 hours. Diabetic: No results for input(s): "HGBA1C" in the last 72 hours. Recent Labs  Lab 12/07/23 1558 12/07/23 2007 12/07/23 2330 12/08/23 0354 12/08/23 0725  GLUCAP 142* 137* 149* 265* 272*   Cardiac Enzymes: No results for input(s): "CKTOTAL", "CKMB", "CKMBINDEX", "TROPONINI" in the last 168 hours. No results for input(s): "PROBNP" in the last 8760 hours. Coagulation Profile: Recent Labs  Lab 12/06/23 0730  INR 1.9*   Thyroid Function Tests: Recent Labs    12/08/23 0357  TSH 3.990   Lipid Profile: No results for input(s): "CHOL", "HDL", "LDLCALC", "TRIG", "CHOLHDL", "LDLDIRECT" in the last 72 hours. Anemia Panel: No results for input(s): "VITAMINB12", "FOLATE", "FERRITIN", "TIBC", "IRON", "RETICCTPCT" in the last 72 hours. Urine analysis:    Component Value Date/Time   COLORURINE YELLOW 12/06/2023 1144   APPEARANCEUR CLEAR 12/06/2023 1144   LABSPEC 1.010 12/06/2023 1144   PHURINE 9.0 (H) 12/06/2023 1144   GLUCOSEU NEGATIVE 12/06/2023 1144   HGBUR SMALL (A) 12/06/2023 1144   BILIRUBINUR NEGATIVE 12/06/2023 1144   KETONESUR NEGATIVE 12/06/2023 1144   PROTEINUR 30 (A) 12/06/2023 1144   NITRITE  NEGATIVE 12/06/2023 1144   LEUKOCYTESUR NEGATIVE 12/06/2023 1144   Sepsis Labs: Invalid input(s): "PROCALCITONIN", "LACTICIDVEN"  Microbiology: Recent Results (from the past 240 hours)  Resp panel by RT-PCR (RSV, Flu A&B, Covid) Anterior Nasal Swab     Status: None   Collection Time: 12/06/23  6:51 AM   Specimen: Anterior Nasal  Swab  Result Value Ref Range Status   SARS Coronavirus 2 by RT PCR NEGATIVE NEGATIVE Final   Influenza A by PCR NEGATIVE NEGATIVE Final   Influenza B by PCR NEGATIVE NEGATIVE Final    Comment: (NOTE) The Xpert Xpress SARS-CoV-2/FLU/RSV plus assay is intended as an aid in the diagnosis of influenza from Nasopharyngeal swab specimens and should not be used as a sole basis for treatment. Nasal washings and aspirates are unacceptable for Xpert Xpress SARS-CoV-2/FLU/RSV testing.  Fact Sheet for Patients: BloggerCourse.com  Fact Sheet for Healthcare Providers: SeriousBroker.it  This test is not yet approved or cleared by the [place]  FDA and has been authorized for detection and/or diagnosis of SARS-CoV-2 by FDA under an Emergency Use Authorization (EUA). This EUA will remain in effect (meaning this test can be used) for the duration of the COVID-19 declaration under Section 564(b)(1) of the Act, 21 U.S.C. section 360bbb-3(b)(1), unless the authorization is terminated or revoked.     Resp Syncytial Virus by PCR NEGATIVE NEGATIVE Final    Comment: (NOTE) Fact Sheet for Patients: BloggerCourse.com  Fact Sheet for Healthcare Providers: SeriousBroker.it  This test is not yet approved or cleared by the [place]  FDA and has been authorized for detection and/or diagnosis of SARS-CoV-2 by FDA under an Emergency Use Authorization (EUA). This EUA will remain in effect (meaning this test can be used) for the duration of the COVID-19 declaration under  Section 564(b)(1) of the Act, 21 U.S.C. section 360bbb-3(b)(1), unless the authorization is terminated or revoked.  Performed at [facility] [place] [facility], [address], [place], [place] [address]   Blood Culture (routine x 2)     Status: None (Preliminary result)   Collection Time: 12/06/23  6:51 AM   Specimen: BLOOD  Result Value Ref Range Status   Specimen Description BLOOD RIGHT ANTECUBITAL  Final   Special Requests   Final    BOTTLES DRAWN AEROBIC AND ANAEROBIC Blood Culture results may not be optimal due to an inadequate volume of blood received in culture bottles   Culture   Final    NO GROWTH 2 DAYS Performed at [facility], [address], [place], [place] [address]    Report Status PENDING  Incomplete  Blood Culture (routine x 2)     Status: None (Preliminary result)   Collection Time: 12/07/23  4:08 AM   Specimen: BLOOD RIGHT HAND  Result Value Ref Range Status   Specimen Description BLOOD RIGHT HAND  Final   Special Requests   Final    BOTTLES DRAWN AEROBIC AND ANAEROBIC Blood Culture results may not be optimal due to an inadequate volume of blood received in culture bottles   Culture   Final    NO GROWTH 1 DAY Performed at [facility], [address] [place], [place] [address]    Report Status PENDING  Incomplete    Radiology Studies: No results found.    Jaydy Fitzhenry T. Tavarious Freel [facility]  If 7PM-7AM, please contact night-coverage [email or web address] 12/08/2023, 6:38 PM

## 2023-12-08 NOTE — Procedures (Signed)
 Received patient in bed to unit.  Alert and oriented.  Informed consent signed and in chart.   TX duration: 2.5 hours  Patient tolerated well.  Transported back to the room  Alert, without acute distress.  Hand-off given to patient's nurse.   Access used: right cath Access issues: none  Total UF removed: 1 liter Medication(s) given: heparin  Clover Dao, RN [facility] Dialysis Unit

## 2023-12-08 NOTE — Inpatient Diabetes Management (Signed)
 Inpatient Diabetes Program Recommendations  AACE/ADA: New Consensus Statement on Inpatient Glycemic Control (2015)  Target Ranges:  Prepandial:   less than 140 mg/dL      Peak postprandial:   less than 180 mg/dL (1-2 hours)      Critically ill patients:  140 - 180 mg/dL   Lab Results  Component Value Date   GLUCAP 272 (H) 12/08/2023   HGBA1C 6.6 (H) 09/25/2023    Review of Glycemic Control  Latest Reference Range & Units 12/07/23 07:28 12/07/23 13:57 12/07/23 15:58 12/07/23 20:07 12/07/23 23:30 12/08/23 03:54 12/08/23 07:25  Glucose-Capillary 70 - 99 mg/dL 960 (H) 454 (H) 098 (H) 137 (H) 149 (H) 265 (H) 272 (H)   Diabetes history: DM 2 Outpatient Diabetes medications: Novolog 1-10 units tid, Latus 15 units qhs Current orders for Inpatient glycemic control:  Semglee 5 units qhs Novolog 0-6 units Q4  Jevity 60 ml/hour  Inpatient Diabetes Program Recommendations:    -   start Novolog 3 units Q4 hours Tube Feed Coverage  Thanks,  Eloise Hake RN, MSN, BC-ADM Inpatient Diabetes Coordinator Team Pager [phone] (8a-5p)

## 2023-12-08 NOTE — Progress Notes (Signed)
 Gastroenterology Inpatient Follow Up    Subjective: Patient states that she is doing very well today.  She had 3 BMs yesterday and 1 BM today.  Denies abdominal pain.  She has been able to eat and drink without issues.  Denies any nausea or vomiting.  Denies rectal pain.  Objective: Vital signs in last 24 hours: Temp:  [97.9 F (36.6 C)-98.5 F (36.9 C)] 98.5 F (36.9 C) (04/16 1659) Pulse Rate:  [77-115] 88 (04/16 1659) Resp:  [11-27] 17 (04/16 1659) BP: (92-146)/(52-90) 137/80 (04/16 1659) SpO2:  [86 %-100 %] 99 % (04/16 1220) Weight:  [55.8 kg-60.1 kg] 55.8 kg (04/16 1220) Last BM Date : 12/07/23  Intake/Output from previous day: 04/15 0701 - 04/16 0700 In: 1941.1 [P.O.:220; I.V.:102; NG/GT:956.8; IV Piggyback:392.3] Out: 2341 [Urine:220; Drains:120; Stool:1001] Intake/Output this shift: No intake/output data recorded.  General appearance: alert and cooperative Cardio: Regular rate Resp: No increased WOB GI: Nontender.  G-tube in place  Lab Results: Recent Labs    12/06/23 0701 12/07/23 0408 12/08/23 0357  WBC 9.0 5.9 6.2  HGB 10.7* 10.0* 8.6*  HCT 32.7* 31.1* 26.6*  PLT 149* 121* 138*   BMET Recent Labs    12/07/23 0406 12/07/23 0409 12/08/23 0357  NA 135 135 131*  K 5.5* 5.5* 4.2  CL 97* 97* 95*  CO2 24 25 27   GLUCOSE 113* 114* 278*  BUN 31* 31* 13  CREATININE 4.09* 3.92* 2.48*  CALCIUM 9.1 9.1 8.9   LFT Recent Labs    12/07/23 0406 12/07/23 0409 12/08/23 0357  PROT 6.9  --   --   ALBUMIN 3.1*   < > 2.7*  AST 33  --   --   ALT 29  --   --   ALKPHOS 146*  --   --   BILITOT 0.5  --   --    < > = values in this interval not displayed.   PT/INR Recent Labs    12/06/23 0730  LABPROT 21.6*  INR 1.9*   Hepatitis Panel Recent Labs    12/07/23 0408  HEPBSAG NON REACTIVE   C-Diff No results for input(s): "CDIFFTOX" in the last 72 hours.  Studies/Results: No results found.  Medications: I have reviewed the patient's current  medications. Scheduled:  apixaban  2.5 mg Oral BID   atorvastatin  10 mg Per Tube QHS   bisacodyl  10 mg Rectal Daily   [START ON 12/09/2023] carvedilol  3.125 mg Oral BID WC   Chlorhexidine Gluconate Cloth  6 each Topical Q0600   darbepoetin (ARANESP) injection - DIALYSIS  100 mcg Subcutaneous Q Wed-1800   feeding supplement (JEVITY 1.5 CAL/FIBER)  600 mL Per Tube Q24H   insulin aspart  0-5 Units Subcutaneous QHS   [START ON 12/09/2023] insulin aspart  0-9 Units Subcutaneous TID WC   insulin glargine-yfgn  8 Units Subcutaneous QHS   multivitamin  1 tablet Oral QHS   ondansetron (ZOFRAN) IV  4 mg Intravenous Once   polyethylene glycol  17 g Per Tube BID   senna-docusate  1 tablet Per Tube BID   sodium chloride flush  3 mL Intravenous Q12H   Continuous:  cefTRIAXone (ROCEPHIN)  IV Stopped (12/08/23 0930)   heparin 800 Units/hr (12/08/23 1625)   metronidazole 500 mg (12/08/23 1624)   OZH:[id number] **OR** acetaminophen, ondansetron (ZOFRAN) IV  Assessment/Plan: 78 year old female with history of DM, CVA with dysphagia s/p G-tube, CHF, ESRD on HD, dementia, prior DVT s/p IVC filter placement, prior  HCV s/p treatment presented with fever as well as nausea and vomiting, found to have labial abscess and significant constipation with stercoral colitis. CT upon arrival shows a stable large amount of stool within the rectum with stable rectal wall thickening that is suspicious for stercoral colitis.  Patient was able to have several stools and states that she is feeling better.  She currently denies nausea, vomiting, abdominal pain, or rectal pain. - Continue laxative regimen with Miralax BID per G-tube, senna BID per G-tube, and daily Dulcolax suppositories  - Patient declined colonoscopy during this hospitalization - Agree with antibiotic therapy - Patient can follow-up up with [facility] where she established recently - GI will sign off for now.  Please call back if any new questions  arise.   LOS: 1 day   Daina Drum 12/08/2023, 8:37 PM

## 2023-12-09 DIAGNOSIS — I5020 Unspecified systolic (congestive) heart failure: Secondary | ICD-10-CM | POA: Diagnosis not present

## 2023-12-09 DIAGNOSIS — K5289 Other specified noninfective gastroenteritis and colitis: Secondary | ICD-10-CM | POA: Diagnosis not present

## 2023-12-09 DIAGNOSIS — Z86718 Personal history of other venous thrombosis and embolism: Secondary | ICD-10-CM | POA: Diagnosis not present

## 2023-12-09 DIAGNOSIS — G309 Alzheimer's disease, unspecified: Secondary | ICD-10-CM | POA: Diagnosis not present

## 2023-12-09 LAB — GLUCOSE, CAPILLARY
Glucose-Capillary: 130 mg/dL — ABNORMAL HIGH (ref 70–99)
Glucose-Capillary: 329 mg/dL — ABNORMAL HIGH (ref 70–99)
Glucose-Capillary: 121 mg/dL — ABNORMAL HIGH (ref 70–99)
Glucose-Capillary: 89 mg/dL (ref 70–99)
Glucose-Capillary: 85 mg/dL (ref 70–99)
Glucose-Capillary: 119 mg/dL — ABNORMAL HIGH (ref 70–99)

## 2023-12-09 LAB — CBC
HCT: 26.7 % — ABNORMAL LOW (ref 36.0–46.0)
Hemoglobin: 8.7 g/dL — ABNORMAL LOW (ref 12.0–15.0)
MCH: 29.7 pg (ref 26.0–34.0)
MCHC: 32.6 g/dL (ref 30.0–36.0)
MCV: 91.1 fL (ref 80.0–100.0)
Platelets: 148 10*3/uL — ABNORMAL LOW (ref 150–400)
RBC: 2.93 MIL/uL — ABNORMAL LOW (ref 3.87–5.11)
RDW: 15.9 % — ABNORMAL HIGH (ref 11.5–15.5)
WBC: 5.3 10*3/uL (ref 4.0–10.5)
nRBC: 0 % (ref 0.0–0.2)

## 2023-12-09 LAB — RENAL FUNCTION PANEL
Albumin: 2.7 g/dL — ABNORMAL LOW (ref 3.5–5.0)
Anion gap: 14 (ref 5–15)
BUN: 10 mg/dL (ref 8–23)
CO2: 25 mmol/L (ref 22–32)
Calcium: 8.7 mg/dL — ABNORMAL LOW (ref 8.9–10.3)
Chloride: 94 mmol/L — ABNORMAL LOW (ref 98–111)
Creatinine, Ser: 2.21 mg/dL — ABNORMAL HIGH (ref 0.44–1.00)
GFR, Estimated: 22 mL/min — ABNORMAL LOW (ref >60)
Glucose, Bld: 276 mg/dL — ABNORMAL HIGH (ref 70–99)
Phosphorus: 3 mg/dL (ref 2.5–4.6)
Potassium: 4 mmol/L (ref 3.5–5.1)
Sodium: 133 mmol/L — ABNORMAL LOW (ref 135–145)

## 2023-12-09 MED ORDER — POLYETHYLENE GLYCOL 3350 17 GM/SCOOP PO POWD: 17.0000 g | Freq: Two times a day (BID) | ORAL | 2 refills | Status: AC | PRN

## 2023-12-09 MED ORDER — SENNOSIDES-DOCUSATE SODIUM 8.6-50 MG PO TABS: 1.0000 | ORAL_TABLET | Freq: Two times a day (BID) | ORAL | Status: DC | PRN

## 2023-12-09 MED ORDER — AMOXICILLIN-POT CLAVULANATE 875-125 MG PO TABS: 1.0000 | ORAL_TABLET | Freq: Every evening | ORAL | 0 refills | Status: AC

## 2023-12-09 NOTE — Progress Notes (Signed)
 Nutrition Follow-up  DOCUMENTATION CODES:   Not applicable  INTERVENTION:  Nocturnal TF via PEG tube: Jevity 1.5 at 60ml x 10 hours (8pm-6am) Provides 900 kcal, 37g protein and free water  Renal MVI with minerals daily  Encourage adequate daytime PO intake as tolerated  NUTRITION DIAGNOSIS:  Altered GI function related to constipation as evidenced by other (comment) (CT abdomen results). - progressing  GOAL:  Patient will meet greater than or equal to 90% of their needs - addressing via meals and nutrition support  MONITOR:  PO intake, TF tolerance  REASON FOR ASSESSMENT:  Consult Enteral/tube feeding initiation and management  ASSESSMENT:  PMH: hypertension, diabetes, neuropathy, CVA, PEG tube, cholecystostomy tube, CHF, DVT, ESRD, pancytopenia, dementia presenting with fever from home and recent N/V. Adm for stercoral colitis.  Pt sitting up in bed at time of visit. Endorses feeling much better.  Noted plans for discharge today.   She endorses tolerance to TF regimen.  Having bowel movements.   Admit weight: 57 kg Current weight: 56.7 kg EDW: 57.2 kg  Drains/lines: RUQ biliary tube: x12 hours HD net UF: 1L  Medications: dulcolax daily, SSI 0-5 units daily, SSI 0-9 units TID, semglee 8 units daily, rena-vit, miralax BID, senna BID, IV abx  Labs:  Sodium 133 Cr 2.21 GFR 22 CBG's 112-329  Diet Order:   Diet Order             Diet - low sodium heart healthy           Diet Carb Modified           DIET DYS 3 Room service appropriate? No; Fluid consistency: Thin; Fluid restriction: 1200 mL Fluid  Diet effective now                   EDUCATION NEEDS:   Education needs have been addressed  Skin:  Skin Assessment: Reviewed RN Assessment  Last BM:  4/15  Height:   Ht Readings from Last 1 Encounters:  12/06/23 4\' 11"  (1.499 m)    Weight:   Wt Readings from Last 1 Encounters:  12/09/23 56.7 kg   BMI:  Body mass index is 25.25  kg/m.  Estimated Nutritional Needs:   Kcal:  1450-1700  Protein:  70-90  Fluid:  >1.5L or per MD  Rocklin Chute, RDN, LDN Clinical Nutrition See AMiON for contact information.

## 2023-12-09 NOTE — Plan of Care (Signed)
  Problem: Metabolic: Goal: Ability to maintain appropriate glucose levels will improve Outcome: Progressing   Problem: Nutritional: Goal: Maintenance of adequate nutrition will improve Outcome: Progressing   Problem: Skin Integrity: Goal: Risk for impaired skin integrity will decrease Outcome: Progressing   Problem: Tissue Perfusion: Goal: Adequacy of tissue perfusion will improve Outcome: Progressing   Problem: Clinical Measurements: Goal: Diagnostic test results will improve Outcome: Progressing Goal: Respiratory complications will improve Outcome: Progressing Goal: Cardiovascular complication will be avoided Outcome: Progressing   Problem: Nutrition: Goal: Adequate nutrition will be maintained Outcome: Progressing   Problem: Elimination: Goal: Will not experience complications related to bowel motility Outcome: Progressing Goal: Will not experience complications related to urinary retention Outcome: Progressing   Problem: Pain Managment: Goal: General experience of comfort will improve and/or be controlled Outcome: Progressing   Problem: Safety: Goal: Ability to remain free from injury will improve Outcome: Progressing   Problem: Skin Integrity: Goal: Risk for impaired skin integrity will decrease Outcome: Progressing

## 2023-12-09 NOTE — Progress Notes (Signed)
 PITA is here to pick up the pt. Pt  was cleaned and put on her night gown. Pt's VSS. CBG 119. IV removed and tube feeding stopped. Pt's belongings and discharge instructions were given to pt's daughter earlier.

## 2023-12-09 NOTE — TOC Transition Note (Signed)
 Transition of Care (TOC) - Discharge Note Sherin Dingwall RN, BSN Transitions of Care Unit 4E- RN Case Manager See Treatment Team for direct phone #   Patient Details  Name: Samantha [name] MRN: [id number] Date of Birth: 1946/02/05  Transition of Care [facility]) CM/SW Contact:  Rox Cope, RN Phone Number: 12/09/2023, 1:41 PM   Clinical Narrative:    Pt stable for transition home today, lives at home with daughter.  CM notified by [facility] that pt active with them for HHRN-following wound care needs. Order placed for resumption of HHRN.  [facility] liaison updated on discharge for today and to follow up to resume HH.   Pt has needed DME at home, will transport home via EMS- per bedside RN daughter Jocelyn has called and requested that transport be done after 530pm in order for family to be home after work.   [facility] called and transport scheduled with dispatch for 530pm. Paperwork placed on chart.   No further TOC needs noted.   Final next level of care: Home w Home Health Services Barriers to Discharge: No Barriers Identified   Patient Goals and CMS Choice Patient states their goals for this hospitalization and ongoing recovery are:: return home CMS Medicare.gov Compare Post Acute Care list provided to:: Patient Represenative (must comment) Choice offered to / list presented to : Adult Children      Discharge Placement               Home w/ [facility]        Discharge Plan and Services Additional resources added to the After Visit Summary for     Discharge Planning Services: CM Consult Post Acute Care Choice: Home Health, Resumption of Svcs/PTA Provider          DME Arranged: N/A DME Agency: NA       HH Arranged: RN HH Agency: [facility] Date HH Agency Contacted: 12/09/23 Time HH Agency Contacted: 1020 Representative spoke with at [facility] Agency: Brandi  Social Drivers of Health (SDOH) Interventions SDOH Screenings   Food Insecurity: No Food  Insecurity (12/07/2023)  Housing: Low Risk  (12/07/2023)  Transportation Needs: No Transportation Needs (12/07/2023)  Utilities: Not At Risk (12/07/2023)  Depression (PHQ2-9): Low Risk  (06/24/2023)  Financial Resource Strain: Low Risk  (06/20/2023)  Physical Activity: Unknown (06/20/2023)  Social Connections: Socially Isolated (12/07/2023)  Stress: No Stress Concern Present (06/20/2023)  Tobacco Use: Unknown (12/05/2023)  Health Literacy: Low Risk  (01/23/2020)   Received from [facility]     Readmission Risk Interventions    12/09/2023    1:41 PM 04/01/2023    3:49 PM  Readmission Risk Prevention Plan  Transportation Screening Complete Complete  Medication Review (RN Care Manager) Complete Referral to Pharmacy  PCP or Specialist appointment within 3-5 days of discharge Complete Complete  HRI or Home Care Consult Complete Complete  SW Recovery Care/Counseling Consult Complete Complete  Palliative Care Screening Not Applicable Not Applicable  Skilled Nursing Facility Not Applicable Not Applicable

## 2023-12-09 NOTE — Progress Notes (Signed)
D/C order noted. Contacted [facility] to be advised of pt's d/c today and that pt should resume care tomorrow.   Olivia Canter Renal Navigator [phone]

## 2023-12-09 NOTE — Plan of Care (Signed)

## 2023-12-09 NOTE — Progress Notes (Signed)
 [facility] Progress Note   Subjective: Seen in room, eating. DC home today. Next HD at OP clinic     Objective Vitals:   12/09/23 0006 12/09/23 0258 12/09/23 0820 12/09/23 1255  BP: 120/64 (!) 114/58 131/60 (!) 155/77  Pulse: 92  84 96  Resp: 16 16 18 16   Temp: 97.9 F (36.6 C) 98.4 F (36.9 C) 98.4 F (36.9 C)   TempSrc: Oral Oral Oral Oral  SpO2: 99% 100% 100% 100%  Weight:  56.7 kg    Height:       Physical Exam General:Chronically ill appearing female in NAD Heart: S1,S2 RRR No M/R/G Lungs: CTAB Abdomen: NABS, NT Extremities: No LE edema Dialysis Access: RIJ [facility] drsg intact    Additional Objective Labs: Basic Metabolic Panel: Recent Labs  Lab 12/07/23 0409 12/08/23 0357 12/09/23 0335  NA 135 131* 133*  K 5.5* 4.2 4.0  CL 97* 95* 94*  CO2 25 27 25   GLUCOSE 114* 278* 276*  BUN 31* 13 10  CREATININE 3.92* 2.48* 2.21*  CALCIUM 9.1 8.9 8.7*  PHOS 3.9 3.4 3.0   Liver Function Tests: Recent Labs  Lab 12/06/23 0730 12/07/23 0406 12/07/23 0409 12/08/23 0357 12/09/23 0335  AST 30 33  --   --   --   ALT 28 29  --   --   --   ALKPHOS 159* 146*  --   --   --   BILITOT 0.6 0.5  --   --   --   PROT 7.1 6.9  --   --   --   ALBUMIN 3.1* 3.1* 3.1* 2.7* 2.7*   No results for input(s): "LIPASE", "AMYLASE" in the last 168 hours. CBC: Recent Labs  Lab 12/06/23 0701 12/07/23 0408 12/08/23 0357 12/09/23 0335  WBC 9.0 5.9 6.2 5.3  NEUTROABS 5.4  --  3.7  --   HGB 10.7* 10.0* 8.6* 8.7*  HCT 32.7* 31.1* 26.6* 26.7*  MCV 92.4 91.7 90.5 91.1  PLT 149* 121* 138* 148*   Blood Culture    Component Value Date/Time   SDES BLOOD RIGHT HAND 12/07/2023 0408   SPECREQUEST  12/07/2023 0408    BOTTLES DRAWN AEROBIC AND ANAEROBIC Blood Culture results may not be optimal due to an inadequate volume of blood received in culture bottles   CULT  12/07/2023 0408    NO GROWTH 2 DAYS Performed at [facility], [address] [place], [place]  [address]    REPTSTATUS PENDING 12/07/2023 0408    Cardiac Enzymes: No results for input(s): "CKTOTAL", "CKMB", "CKMBINDEX", "TROPONINI" in the last 168 hours. CBG: Recent Labs  Lab 12/08/23 0354 12/08/23 0725 12/08/23 1705 12/08/23 2118 12/09/23 0629  GLUCAP 265* 272* 130* 112* 329*   Iron Studies: No results for input(s): "IRON", "TIBC", "TRANSFERRIN", "FERRITIN" in the last 72 hours. @lablastinr3 @ Studies/Results: No results found. Medications:  cefTRIAXone (ROCEPHIN)  IV 2 g (12/09/23 0837)   metronidazole 500 mg (12/09/23 1007)    apixaban  2.5 mg Oral BID   atorvastatin  10 mg Per Tube QHS   bisacodyl  10 mg Rectal Daily   carvedilol  3.125 mg Oral BID WC   Chlorhexidine Gluconate Cloth  6 each Topical Q0600   darbepoetin (ARANESP) injection - DIALYSIS  100 mcg Subcutaneous Q Wed-1800   feeding supplement (JEVITY 1.5 CAL/FIBER)  600 mL Per Tube Q24H   insulin aspart  0-5 Units Subcutaneous QHS   insulin aspart  0-9 Units Subcutaneous TID WC  insulin glargine-yfgn  8 Units Subcutaneous QHS   multivitamin  1 tablet Oral QHS   ondansetron (ZOFRAN) IV  4 mg Intravenous Once   polyethylene glycol  17 g Per Tube BID   senna-docusate  1 tablet Per Tube BID   sodium chloride flush  3 mL Intravenous Q12H     OP HD: MWF SW 3h  B350   57.2kg   2K bath  RIJ TDC  Heparin 2500 + 1500 midrun Last HD 4/11, post HD wt 58.0kg Mircera 50 mcg q 2 wks IV     Assessment/ Plan: Fever/ stercoral colitis: CT w/ stranding around the rectum. Getting IV abx per primary team, and gentle IVF's. GI consulted.   Labial abscesses: draining in ED per notes. Per pmd.  ESRD: on HD MWF.  Next HD 12/10/2023 at OP clinic.  HTN: BP's wnl to low. Holding home coreg. Follow.  Volume: close to dry wt after last HD on 4/11. No vol ^ on exam, on room air. Small UF next HD.  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  Secondary hyperparathyroidism: CCa in range, add on phos. Cont binders if/when  eating.  H/o DVT: eliquis --> change to IV heparin per primary team  Avyan Livesay H. Devyn Sheerin NP-C 12/09/2023, 1:54 PM  [employer] [phone]    \

## 2023-12-09 NOTE — Discharge Summary (Addendum)
 Physician Discharge Summary  Samantha [name] ZOX:[id number] DOB: [birth year] DOA: 12/06/2023  PCP: [name], MD  Admit date: 12/06/2023 Discharge date: 12/09/23  Admitted From: Home Disposition: Home Recommendations for Outpatient Follow-up:  Outpatient follow-up with PCP in 1 to 2 weeks Outpatient follow-up with primary GI as soon as possible Check CBC and CMP in 1 to 2 weeks Please follow up on the following pending results: None  Home Health: No need identified. Equipment/Devices: Patient has appropriate DME  Discharge Condition: Stable CODE STATUS: Full code  Follow-up Information     [facility] Follow up.   Specialty: Home Health Services Why: [facility] services to resume Contact information: [address] [place] [address] [phone]                 Hospital course 78 year old F with PMH of dementia, ESRD on HD MWF,HFmrEF, cholecystostomy, DVT s/p IVC filter, DM-2, CVA and dysphagia on tube feed via PEG brought to the ED with fever, nausea and vomiting, and admitted with working diagnosis of sterile coral colitis/fever and possible labial abscess.  CT abdomen and pelvis done noted a moderate right pleural effusion, stable cholecystostomy tube with persistent gallbladder wall thickening and a decompressed gallbladder, increased edema of the chest wall at the cholecystostomy tube site, large stool noted compared to previous at the rectum with wall thickening and stranding consistent with stercoral colitis. Patient placed empirically started on ceftriaxone and Flagyl.  Nephrology and GI consulted   Per GI, she declined colonoscopy.  She was started on scheduled bowel regimen and had good bowel movements.  In regards to possible labial abscess, it was noted to be spontaneously draining on arrival to ED. further evaluation on 12/08/2023 without evidence of abscess.  Patient was continued on ceftriaxone and Flagyl while in-house and  discharged on p.o. Augmentin for 4 more days to complete a total of 7 days course.  Emphasized the importance of good bowel regimen to avoid constipation.  GI recommended outpatient follow-up with primary GI.   See individual problem list below for more.   Problems addressed during this hospitalization Stercoral colitis: Presents with fever, nausea and vomiting. CT abdomen and pelvis with stable gallbladder wall thickening and decompressed gallbladder with stable cholecystostomy tube placement.  Some edema noted to chest wall at tube site.  Large stool burden in the rectum with wall thickening and fat stranding consistent with stercoral colitis.  Patient had good bowel movements with scheduled bowel regimen -Received ceftriaxone and Flagyl for 3 days and discharged on p.o. Augmentin for 4 more days -Senokot-S and/or MiraLAX twice daily as needed for constipation   Labial abscess?  Reportedly draining in ED. 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 -Continue home insulin.   ChronicHFmrEF: TTE in 2024 with LVEF of 40 to 45%, indeterminate DD and normal RV function.  Appears euvolemic on exam. -Fluid management per dialysis - Continue home Coreg   Essential hypertension: Normotensive for most part. - Continue home Coreg   Dementia without behavioral disturbance: Awake and alert.  Oriented to self, place and month but not date and year.  Oriented to person.  Lives with his daughters.  Dependent for most ADL's.  Not ambulatory.   Anemia of renal disease: H&H stable after initial drop. Recent Labs    09/01/23 1954 09/25/23 0206 09/25/23 1239 09/26/23  1610 09/27/23 0406 10/19/23 1106 12/06/23 0701 12/07/23 0408 12/08/23 0357 12/09/23 0335  HGB 11.9* 9.6* 10.1* 9.0* 8.2* 10.8* 10.7* 10.0* 8.6* 8.7*  -Aranesp and IV iron per  nephrology -Recheck CBC in 1 to 2 weeks  History of DVT-was on IV heparin in case she needed procedure -Continue home Eliquis   History of CVA/dysphagia -Dysphagia 3 diet -Tube feed per PEG at night   ESRD, MWF -Dialysis per nephrology   Hyponatremia: Likely due to ESRD.  Improved -Continue monitoring   Nutrition/altered GI function Nutrition Problem: Altered GI function Etiology: constipation Signs/Symptoms: other (comment) (CT abdomen results) Interventions: Tube feeding, MVI  Pressure skin injury Pressure Injury 12/07/23 Coccyx Proximal;Medial Stage 2 -  Partial thickness loss of dermis presenting as a shallow open injury with a red, pink wound bed without slough. (Active)  12/07/23 0730  Location: Coccyx  Location Orientation: Proximal;Medial  Staging: Stage 2 -  Partial thickness loss of dermis presenting as a shallow open injury with a red, pink wound bed without slough.  Wound Description (Comments):   Present on Admission: Yes  Dressing Type Foam - Lift dressing to assess site every shift 12/09/23 0820    Time spent 35 minutes  Vital signs Vitals:   12/09/23 0006 12/09/23 0258 12/09/23 0820 12/09/23 1255  BP: 120/64 (!) 114/58 131/60 (!) 155/77  Pulse: 92  84 96  Temp: 97.9 F (36.6 C) 98.4 F (36.9 C) 98.4 F (36.9 C)   Resp: 16 16 18 16   Height:      Weight:  56.7 kg    SpO2: 99% 100% 100% 100%  TempSrc: Oral Oral Oral Oral  BMI (Calculated):  25.23       Discharge exam  GENERAL: No apparent distress.  Nontoxic. HEENT: MMM.  Vision and hearing grossly intact.  NECK: Supple.  No apparent JVD.  RESP:  No IWOB.  Fair aeration bilaterally. CVS:  RRR. Heart sounds normal.  ABD/GI/GU: BS+. Abd soft, NTND.  Cholecystostomy tube and PEG tube in place. MSK/EXT: BLE paresis/contracture.  Barely moves lower extremities. SKIN: no apparent skin lesion or wound NEURO: Awake and alert.  Oriented to self, place and person.  Does not move lower  extremities. PSYCH: Calm. Normal affect.   Discharge Instructions Discharge Instructions     Diet - low sodium heart healthy   Complete by: As directed    DIET DYS 3 Room service appropriate? No; Fluid consistency: Thin; Fluid restriction: 1200 mL Fluid  Diet effective now   Diet Carb Modified   Complete by: As directed    DIET DYS 3 Room service appropriate? No; Fluid consistency: Thin; Fluid restriction: 1200 mL Fluid  Diet effective now   Discharge instructions   Complete by: As directed    It has been a pleasure taking care of you!  You were hospitalized due to severe constipation with infection.  You have been started on antibiotics for infection.  Constipation seems to have resolved with medications.  We are discharging you more antibiotics and medications for constipation.  Please review your new medication list and the directions on your medications before you take them.  Follow-up with your primary care doctor in 1 to 2 weeks or sooner if needed.  Follow-up with your gastroenterologist as soon as possible.   Take care,   Discharge wound care:   Complete by: As directed    Pressure Injury 12/07/23 Coccyx Proximal;Medial Stage 2 -  Partial thickness loss of dermis presenting as a shallow open injury  with a red, pink wound bed without slough. Recommend frequent turning and monitoring   Increase activity slowly   Complete by: As directed       Allergies as of 12/09/2023       Reactions   Asa [aspirin] Anaphylaxis   Penicillins Anaphylaxis   **Tolerates cephalosporins        Medication List     STOP taking these medications    doxycycline 100 MG tablet Commonly known as: VIBRA-TABS   senna 8.6 MG Tabs tablet Commonly known as: SENOKOT       TAKE these medications    acetaminophen 500 MG tablet Commonly known as: TYLENOL Take 500 mg by mouth daily as needed for mild pain (pain score 1-3).   amoxicillin-clavulanate 875-125 MG tablet Commonly known as:  AUGMENTIN Take 1 tablet by mouth every evening for 4 days.   apixaban 2.5 MG Tabs tablet Commonly known as: Eliquis Take 1 tablet (2.5 mg total) by mouth 2 (two) times daily.   atorvastatin 10 MG tablet Commonly known as: LIPITOR Take 1 tablet (10 mg total) by mouth daily. What changed: when to take this   Baqsimi One Pack 3 MG/DOSE Powd Generic drug: Glucagon Place 1 packet into the nose as needed (For blood sugar). Use when blood sugar drops below 70.   carvedilol 3.125 MG tablet Commonly known as: COREG Take 3.125 mg Q AM  and 3.125 mg Q PM on non dialysis days (Tues, Thurs, Sat and Sun,  - On dialysis days,  MWF, continue Coreg 3.125 mg Q PM -  Hold for SBP < 100 What changed:  how much to take how to take this when to take this additional instructions   feeding supplement (OSMOLITE 1.5 CAL) Liqd Place 600 mLs into feeding tube See admin instructions. 8p-6a   insulin aspart 100 UNIT/ML injection Commonly known as: novoLOG 0-9 Units, Subcutaneous, 3 times daily with meals, CBG < 70: Implement Hypoglycemia measures CBG 70 - 120: 0 units CBG 121 - 150: 1 unit CBG 151 - 200: 2 units CBG 201 - 250: 3 units CBG 251 - 300: 5 units CBG 301 - 350: 7 units CBG 351 - 400: 9 units CBG > 400: call MD What changed:  how much to take how to take this when to take this additional instructions   insulin glargine 100 UNIT/ML Solostar Pen Commonly known as: LANTUS Inject 15 Units into the skin at bedtime.   multivitamin with minerals tablet Take 1 tablet by mouth at bedtime.   polyethylene glycol powder 17 GM/SCOOP powder Commonly known as: MiraLax Take 17 g by mouth 2 (two) times daily as needed for moderate constipation or mild constipation.   promethazine 25 MG tablet Commonly known as: PHENERGAN Take 0.5 tablets (12.5 mg total) by mouth every 6 (six) hours as needed for nausea or vomiting.   senna-docusate 8.6-50 MG tablet Commonly known as: Senokot-S Take 1-2 tablets by  mouth 2 (two) times daily between meals as needed for mild constipation.               Discharge Care Instructions  (From admission, onward)           Start     Ordered   12/09/23 0000  Discharge wound care:       Comments: Pressure Injury 12/07/23 Coccyx Proximal;Medial Stage 2 -  Partial thickness loss of dermis presenting as a shallow open injury with a red, pink wound bed without slough. Recommend frequent turning and  monitoring   12/09/23 0755            Consultations: Nephrology Gastroenterology  Procedures/Studies:   CT ABDOMEN PELVIS W CONTRAST Result Date: 12/06/2023 CLINICAL DATA:  Abdominal pain, acute, nonlocalized Sepsis EXAM: CT ABDOMEN AND PELVIS WITH CONTRAST TECHNIQUE: Multidetector CT imaging of the abdomen and pelvis was performed using the standard protocol following bolus administration of intravenous contrast. RADIATION DOSE REDUCTION: This exam was performed according to the departmental dose-optimization program which includes automated exposure control, adjustment of the mA and/or kV according to patient size and/or use of iterative reconstruction technique. CONTRAST:  75mL OMNIPAQUE IOHEXOL 350 MG/ML SOLN COMPARISON:  Noncontrast abdominopelvic CT 09/25/2023 and 09/01/2023. FINDINGS: Lower chest: New small to moderate dependent right pleural effusion with increased right basilar atelectasis. Atelectasis at the left lung base has not significantly changed. Unchanged position of the hemodialysis catheter, near the tricuspid valve. Stable cardiomegaly. Hepatobiliary: The liver is normal in density without suspicious focal abnormality. Cholecystostomy tube remains in place with stable decompression of the gallbladder. Persistent gallbladder wall thickening and small calcified gallstones. No significant biliary dilatation. Pancreas: Unremarkable. No pancreatic ductal dilatation or surrounding inflammatory changes. Spleen: Normal in size without focal  abnormality. Adrenals/Urinary Tract: Both adrenal glands appear normal. No evidence of urinary tract calculus, suspicious renal lesion or hydronephrosis. Mild renal cortical thinning and small renal cysts bilaterally for which no specific follow-up imaging is recommended. The bladder appears unremarkable for its degree of distention. Stomach/Bowel: No enteric contrast administered. Percutaneous gastrostomy tube remains in place. A large amount of stool remains within the rectum with stable mild rectal wall thickening and perirectal soft tissue stranding. No other bowel distension, wall thickening or surrounding inflammation identified. The appendix appears normal. Vascular/Lymphatic: There are no enlarged abdominal or pelvic lymph nodes. Infrarenal IVC filter remains in place. There is asymmetric decreased opacification of the left iliac veins without obvious DVT. Aortic and branch vessel atherosclerosis without evidence of aneurysm or large vessel occlusion. Reproductive: Status post hysterectomy.  No adnexal mass. Other: Increased edema in the right lateral chest wall adjacent to the cholecystostomy tube. No focal fluid collection identified. No ascites or pneumoperitoneum. Musculoskeletal: No acute or significant osseous findings. Mild spondylosis. IMPRESSION: 1. New small to moderate dependent right pleural effusion with increased right basilar atelectasis. 2. Stable position of the cholecystostomy tube with stable decompression of the gallbladder. Persistent gallbladder wall thickening and small calcified gallstones. 3. Increased edema in the right lateral chest wall adjacent to the cholecystostomy tube. No focal fluid collection identified. 4. Stable large amount of stool within the rectum with stable mild rectal wall thickening and perirectal soft tissue stranding, suspicious for stercoral colitis. 5. Asymmetric decreased opacification of the left iliac veins without obvious DVT. IVC filter in place. 6.   Aortic Atherosclerosis (ICD10-I70.0). Electronically Signed   By: Carey Bullocks M.D.   On: 12/06/2023 10:54   DG Chest Port 1 View Result Date: 12/06/2023 CLINICAL DATA:  Questionable sepsis EXAM: PORTABLE CHEST 1 VIEW COMPARISON:  06/16/2023 FINDINGS: Chronic cardiomegaly. Dialysis catheter with tip the lower right atrium, unchanged. Low volume chest with indistinct density at the right base that is stable and likely scarring. No edema, effusion, or pneumothorax. Catheter over the right upper quadrant, cholecystostomy tube by prior reports IMPRESSION: Stable chest including dialysis catheter with tip at the lower right atrium. Electronically Signed   By: Tiburcio Pea M.D.   On: 12/06/2023 07:23   IR EXCHANGE BILIARY DRAIN Result Date: 11/30/2023 INDICATION: 78 year old with chronic  cholecystostomy tube. Patient presents for routine cholecystostomy tube exchange. Patient also has pain at gastrostomy tube and plan for gastrostomy tube replacement. EXAM: 1. Gastrostomy tube replacement with fluoroscopy 2. Cholecystostomy tube exchange with fluoroscopy MEDICATIONS: None ANESTHESIA/SEDATION: None FLUOROSCOPY: Radiation Exposure Index (as provided by the fluoroscopic device): 2 mGy Kerma COMPLICATIONS: None immediate. PROCEDURE: Informed written consent was obtained from the patient after a thorough discussion of the procedural risks, benefits and alternatives. All questions were addressed. The old gastrostomy tube balloon was deflated. Old gastrostomy tube was removed. New 20 French gastrostomy tube was advanced easily into the stomach. The balloon was inflated with 10 mL of saline. Contrast injection confirmed placement in the stomach. Cholecystostomy tube and surrounding skin were prepped and draped in sterile fashion. Maximal barrier sterile technique was utilized including caps, mask, sterile gowns, sterile gloves, sterile drape, hand hygiene and skin antiseptic. Skin was anesthetized around the  cholecystostomy tube with 1% lidocaine. Cholecystostomy tube was injected with contrast. Cholecystostomy tube was cut and removed over a wire. New 10 French multipurpose drain was advanced over the wire and reconstituted within the gallbladder. Cholecystostomy tube was sutured to skin and attached to a gravity bag. FINDINGS: Gastrostomy tube is positioned in the stomach. Cholecystostomy tube is positioned in the gallbladder. Filling defects in the gallbladder are suggestive for cholelithiasis. IMPRESSION: 1. Successful exchange of the cholecystostomy tube using fluoroscopy. 2. Successful exchange of the gastrostomy tube using fluoroscopy. Electronically Signed   By: Richarda Overlie M.D.   On: 11/30/2023 16:20   IR Replc Gastro/Colonic Tube Percut W/Fluoro Result Date: 11/30/2023 INDICATION: 78 year old with chronic cholecystostomy tube. Patient presents for routine cholecystostomy tube exchange. Patient also has pain at gastrostomy tube and plan for gastrostomy tube replacement. EXAM: 1. Gastrostomy tube replacement with fluoroscopy 2. Cholecystostomy tube exchange with fluoroscopy MEDICATIONS: None ANESTHESIA/SEDATION: None FLUOROSCOPY: Radiation Exposure Index (as provided by the fluoroscopic device): 2 mGy Kerma COMPLICATIONS: None immediate. PROCEDURE: Informed written consent was obtained from the patient after a thorough discussion of the procedural risks, benefits and alternatives. All questions were addressed. The old gastrostomy tube balloon was deflated. Old gastrostomy tube was removed. New 20 French gastrostomy tube was advanced easily into the stomach. The balloon was inflated with 10 mL of saline. Contrast injection confirmed placement in the stomach. Cholecystostomy tube and surrounding skin were prepped and draped in sterile fashion. Maximal barrier sterile technique was utilized including caps, mask, sterile gowns, sterile gloves, sterile drape, hand hygiene and skin antiseptic. Skin was anesthetized  around the cholecystostomy tube with 1% lidocaine. Cholecystostomy tube was injected with contrast. Cholecystostomy tube was cut and removed over a wire. New 10 French multipurpose drain was advanced over the wire and reconstituted within the gallbladder. Cholecystostomy tube was sutured to skin and attached to a gravity bag. FINDINGS: Gastrostomy tube is positioned in the stomach. Cholecystostomy tube is positioned in the gallbladder. Filling defects in the gallbladder are suggestive for cholelithiasis. IMPRESSION: 1. Successful exchange of the cholecystostomy tube using fluoroscopy. 2. Successful exchange of the gastrostomy tube using fluoroscopy. Electronically Signed   By: Richarda Overlie M.D.   On: 11/30/2023 16:20       The results of significant diagnostics from this hospitalization (including imaging, microbiology, ancillary and laboratory) are listed below for reference.     Microbiology: Recent Results (from the past 240 hours)  Resp panel by RT-PCR (RSV, Flu A&B, Covid) Anterior Nasal Swab     Status: None   Collection Time: 12/06/23  6:51 AM  Specimen: Anterior Nasal Swab  Result Value Ref Range Status   SARS Coronavirus 2 by RT PCR NEGATIVE NEGATIVE Final   Influenza A by PCR NEGATIVE NEGATIVE Final   Influenza B by PCR NEGATIVE NEGATIVE Final    Comment: (NOTE) The Xpert Xpress SARS-CoV-2/FLU/RSV plus assay is intended as an aid in the diagnosis of influenza from Nasopharyngeal swab specimens and should not be used as a sole basis for treatment. Nasal washings and aspirates are unacceptable for Xpert Xpress SARS-CoV-2/FLU/RSV testing.  Fact Sheet for Patients: BloggerCourse.com  Fact Sheet for Healthcare Providers: SeriousBroker.it  This test is not yet approved or cleared by the [place]  FDA and has been authorized for detection and/or diagnosis of SARS-CoV-2 by FDA under an Emergency Use Authorization (EUA). This EUA  will remain in effect (meaning this test can be used) for the duration of the COVID-19 declaration under Section 564(b)(1) of the Act, 21 U.S.C. section 360bbb-3(b)(1), unless the authorization is terminated or revoked.     Resp Syncytial Virus by PCR NEGATIVE NEGATIVE Final    Comment: (NOTE) Fact Sheet for Patients: BloggerCourse.com  Fact Sheet for Healthcare Providers: SeriousBroker.it  This test is not yet approved or cleared by the [place]  FDA and has been authorized for detection and/or diagnosis of SARS-CoV-2 by FDA under an Emergency Use Authorization (EUA). This EUA will remain in effect (meaning this test can be used) for the duration of the COVID-19 declaration under Section 564(b)(1) of the Act, 21 U.S.C. section 360bbb-3(b)(1), unless the authorization is terminated or revoked.  Performed at [facility], [address], [place], [place] [address]   Blood Culture (routine x 2)     Status: None (Preliminary result)   Collection Time: 12/06/23  6:51 AM   Specimen: BLOOD  Result Value Ref Range Status   Specimen Description BLOOD RIGHT ANTECUBITAL  Final   Special Requests   Final    BOTTLES DRAWN AEROBIC AND ANAEROBIC Blood Culture results may not be optimal due to an inadequate volume of blood received in culture bottles   Culture   Final    NO GROWTH 3 DAYS Performed at [facility], [address] [place], [place] [address]    Report Status PENDING  Incomplete  Blood Culture (routine x 2)     Status: None (Preliminary result)   Collection Time: 12/07/23  4:08 AM   Specimen: BLOOD RIGHT HAND  Result Value Ref Range Status   Specimen Description BLOOD RIGHT HAND  Final   Special Requests   Final    BOTTLES DRAWN AEROBIC AND ANAEROBIC Blood Culture results may not be optimal due to an inadequate volume of blood received in culture bottles   Culture   Final    NO GROWTH 2 DAYS Performed at  [facility], [address], [place], [place] [address]    Report Status PENDING  Incomplete     Labs:  CBC: Recent Labs  Lab 12/06/23 0701 12/07/23 0408 12/08/23 0357 12/09/23 0335  WBC 9.0 5.9 6.2 5.3  NEUTROABS 5.4  --  3.7  --   HGB 10.7* 10.0* 8.6* 8.7*  HCT 32.7* 31.1* 26.6* 26.7*  MCV 92.4 91.7 90.5 91.1  PLT 149* 121* 138* 148*   BMP &GFR Recent Labs  Lab 12/06/23 0730 12/07/23 0406 12/07/23 0408 12/07/23 0409 12/08/23 0357 12/09/23 0335  NA 130* 135  --  135 131* 133*  K 5.0 5.5*  --  5.5* 4.2 4.0  CL  91* 97*  --  97* 95* 94*  CO2 29 24  --  25 27 25   GLUCOSE 236* 113*  --  114* 278* 276*  BUN 31* 31*  --  31* 13 10  CREATININE 3.91* 4.09*  --  3.92* 2.48* 2.21*  CALCIUM 9.2 9.1  --  9.1 8.9 8.7*  PHOS  --   --  3.8 3.9 3.4 3.0   Estimated Creatinine Clearance: 16.4 mL/min (A) (by C-G formula based on SCr of 2.21 mg/dL (H)). Liver & Pancreas: Recent Labs  Lab 12/06/23 0730 12/07/23 0406 12/07/23 0409 12/08/23 0357 12/09/23 0335  AST 30 33  --   --   --   ALT 28 29  --   --   --   ALKPHOS 159* 146*  --   --   --   BILITOT 0.6 0.5  --   --   --   PROT 7.1 6.9  --   --   --   ALBUMIN 3.1* 3.1* 3.1* 2.7* 2.7*   No results for input(s): "LIPASE", "AMYLASE" in the last 168 hours. No results for input(s): "AMMONIA" in the last 168 hours. Diabetic: No results for input(s): "HGBA1C" in the last 72 hours. Recent Labs  Lab 12/08/23 0725 12/08/23 1705 12/08/23 2118 12/09/23 0629 12/09/23 1250  GLUCAP 272* 130* 112* 329* 121*   Cardiac Enzymes: No results for input(s): "CKTOTAL", "CKMB", "CKMBINDEX", "TROPONINI" in the last 168 hours. No results for input(s): "PROBNP" in the last 8760 hours. Coagulation Profile: Recent Labs  Lab 12/06/23 0730  INR 1.9*   Thyroid Function Tests: Recent Labs    12/08/23 0357  TSH 3.990   Lipid Profile: No results for input(s): "CHOL", "HDL", "LDLCALC", "TRIG", "CHOLHDL", "LDLDIRECT" in the last 72  hours. Anemia Panel: No results for input(s): "VITAMINB12", "FOLATE", "FERRITIN", "TIBC", "IRON", "RETICCTPCT" in the last 72 hours. Urine analysis:    Component Value Date/Time   COLORURINE YELLOW 12/06/2023 1144   APPEARANCEUR CLEAR 12/06/2023 1144   LABSPEC 1.010 12/06/2023 1144   PHURINE 9.0 (H) 12/06/2023 1144   GLUCOSEU NEGATIVE 12/06/2023 1144   HGBUR SMALL (A) 12/06/2023 1144   BILIRUBINUR NEGATIVE 12/06/2023 1144   KETONESUR NEGATIVE 12/06/2023 1144   PROTEINUR 30 (A) 12/06/2023 1144   NITRITE NEGATIVE 12/06/2023 1144   LEUKOCYTESUR NEGATIVE 12/06/2023 1144   Sepsis Labs: Invalid input(s): "PROCALCITONIN", "LACTICIDVEN"   SIGNED:  Theadore Finger, MD  [facility] 12/09/2023, 2:17 PM

## 2023-12-10 ENCOUNTER — Telehealth: Payer: Self-pay

## 2023-12-10 ENCOUNTER — Telehealth: Payer: Self-pay | Admitting: Nurse Practitioner

## 2023-12-10 DIAGNOSIS — N2581 Secondary hyperparathyroidism of renal origin: Secondary | ICD-10-CM | POA: Diagnosis not present

## 2023-12-10 DIAGNOSIS — D509 Iron deficiency anemia, unspecified: Secondary | ICD-10-CM | POA: Diagnosis not present

## 2023-12-10 DIAGNOSIS — E1122 Type 2 diabetes mellitus with diabetic chronic kidney disease: Secondary | ICD-10-CM | POA: Diagnosis not present

## 2023-12-10 DIAGNOSIS — N186 End stage renal disease: Secondary | ICD-10-CM | POA: Diagnosis not present

## 2023-12-10 DIAGNOSIS — R110 Nausea: Secondary | ICD-10-CM | POA: Diagnosis not present

## 2023-12-10 DIAGNOSIS — Z992 Dependence on renal dialysis: Secondary | ICD-10-CM | POA: Diagnosis not present

## 2023-12-10 DIAGNOSIS — D631 Anemia in chronic kidney disease: Secondary | ICD-10-CM | POA: Diagnosis not present

## 2023-12-10 DIAGNOSIS — D689 Coagulation defect, unspecified: Secondary | ICD-10-CM | POA: Diagnosis not present

## 2023-12-10 NOTE — Telephone Encounter (Signed)
 Transition of care contact from inpatient facility  Date of Discharge: 12/09/23 Date of Contact: 12/10/23 Method of contact: Phone  Attempted to contact patient to discuss transition of care from inpatient admission. Patient did not answer the phone. Message was left on the patient's voicemail with call back number [phone].

## 2023-12-10 NOTE — Transitions of Care (Post Inpatient/ED Visit) (Signed)
   12/10/2023  Name: Samantha [name] MRN: [id number] DOB: February 15, 1946  Today's TOC FU Call Status: Today's TOC FU Call Status:: Unsuccessful Call (1st Attempt) Unsuccessful Call (1st Attempt) Date: 12/10/23  Attempted to reach the patient regarding the most recent Inpatient/ED visit.  Follow Up Plan: Additional outreach attempts will be made to reach the patient to complete the Transitions of Care (Post Inpatient/ED visit) call.   Shona Prow RN, CCM [place]  VBCI-Population Health RN Care Manager [phone]

## 2023-12-10 NOTE — Discharge Planning (Signed)
 [facility] Patient Discharge Orders- [facility] CLINIC: [facility]  Patient's name: Samantha [name] Admit/DC Dates: 12/06/2023 - 12/09/2023  Discharge Diagnoses: Stercoral colitis      Aranesp : Given: Yes  Date and amount of last dose: 100 mcg 12/08/2023  PRBC's Given: No Date/# of units: NA Last Hgb: 8.7 ESA dose for discharge: mircera 100 mcg IV q 2 weeks  IV Iron dose at discharge: Per protocol   Heparin  change: Per protocol  EDW Change: No New EDW: 57 kg   Bath Change: NA  Access intervention/Change: NA Details:  Hectorol/Calcitriol change: NA  Discharge Labs: Calcium  8.7 Phosphorus 3.0 Albumin 2.7 K+ 4.0  IV Antibiotics: NA Details:  On Coumadin?: NA Last INR: Next INR: Managed By:   OTHER/APPTS/LAB ORDERS:    D/C Meds to be reconciled by nurse after every discharge.  Completed By: Jacobo Masters [facility] [phone]    Reviewed by: MD:______ RN_______

## 2023-12-11 DIAGNOSIS — I132 Hypertensive heart and chronic kidney disease with heart failure and with stage 5 chronic kidney disease, or end stage renal disease: Secondary | ICD-10-CM | POA: Diagnosis not present

## 2023-12-11 DIAGNOSIS — Z992 Dependence on renal dialysis: Secondary | ICD-10-CM | POA: Diagnosis not present

## 2023-12-11 DIAGNOSIS — G309 Alzheimer's disease, unspecified: Secondary | ICD-10-CM | POA: Diagnosis not present

## 2023-12-11 DIAGNOSIS — Z86718 Personal history of other venous thrombosis and embolism: Secondary | ICD-10-CM | POA: Diagnosis not present

## 2023-12-11 DIAGNOSIS — E039 Hypothyroidism, unspecified: Secondary | ICD-10-CM | POA: Diagnosis not present

## 2023-12-11 DIAGNOSIS — E1142 Type 2 diabetes mellitus with diabetic polyneuropathy: Secondary | ICD-10-CM | POA: Diagnosis not present

## 2023-12-11 DIAGNOSIS — I69391 Dysphagia following cerebral infarction: Secondary | ICD-10-CM | POA: Diagnosis not present

## 2023-12-11 DIAGNOSIS — Z794 Long term (current) use of insulin: Secondary | ICD-10-CM | POA: Diagnosis not present

## 2023-12-11 DIAGNOSIS — K5900 Constipation, unspecified: Secondary | ICD-10-CM | POA: Diagnosis not present

## 2023-12-11 DIAGNOSIS — E1122 Type 2 diabetes mellitus with diabetic chronic kidney disease: Secondary | ICD-10-CM | POA: Diagnosis not present

## 2023-12-11 DIAGNOSIS — Z931 Gastrostomy status: Secondary | ICD-10-CM | POA: Diagnosis not present

## 2023-12-11 DIAGNOSIS — I69354 Hemiplegia and hemiparesis following cerebral infarction affecting left non-dominant side: Secondary | ICD-10-CM | POA: Diagnosis not present

## 2023-12-11 DIAGNOSIS — Z556 Problems related to health literacy: Secondary | ICD-10-CM | POA: Diagnosis not present

## 2023-12-11 DIAGNOSIS — I252 Old myocardial infarction: Secondary | ICD-10-CM | POA: Diagnosis not present

## 2023-12-11 DIAGNOSIS — Z7901 Long term (current) use of anticoagulants: Secondary | ICD-10-CM | POA: Diagnosis not present

## 2023-12-11 DIAGNOSIS — N186 End stage renal disease: Secondary | ICD-10-CM | POA: Diagnosis not present

## 2023-12-11 DIAGNOSIS — I5020 Unspecified systolic (congestive) heart failure: Secondary | ICD-10-CM | POA: Diagnosis not present

## 2023-12-11 LAB — CULTURE, BLOOD (ROUTINE X 2): Culture: NO GROWTH

## 2023-12-12 DIAGNOSIS — N186 End stage renal disease: Secondary | ICD-10-CM | POA: Diagnosis not present

## 2023-12-12 LAB — CULTURE, BLOOD (ROUTINE X 2): Culture: NO GROWTH

## 2023-12-13 ENCOUNTER — Telehealth: Payer: Self-pay | Admitting: *Deleted

## 2023-12-13 ENCOUNTER — Telehealth: Payer: Self-pay

## 2023-12-13 DIAGNOSIS — N2581 Secondary hyperparathyroidism of renal origin: Secondary | ICD-10-CM | POA: Diagnosis not present

## 2023-12-13 DIAGNOSIS — N186 End stage renal disease: Secondary | ICD-10-CM | POA: Diagnosis not present

## 2023-12-13 DIAGNOSIS — D631 Anemia in chronic kidney disease: Secondary | ICD-10-CM | POA: Diagnosis not present

## 2023-12-13 DIAGNOSIS — R110 Nausea: Secondary | ICD-10-CM | POA: Diagnosis not present

## 2023-12-13 DIAGNOSIS — E7849 Other hyperlipidemia: Secondary | ICD-10-CM | POA: Diagnosis not present

## 2023-12-13 DIAGNOSIS — I222 Subsequent non-ST elevation (NSTEMI) myocardial infarction: Secondary | ICD-10-CM | POA: Diagnosis not present

## 2023-12-13 DIAGNOSIS — Z992 Dependence on renal dialysis: Secondary | ICD-10-CM | POA: Diagnosis not present

## 2023-12-13 DIAGNOSIS — E1122 Type 2 diabetes mellitus with diabetic chronic kidney disease: Secondary | ICD-10-CM | POA: Diagnosis not present

## 2023-12-13 DIAGNOSIS — R1312 Dysphagia, oropharyngeal phase: Secondary | ICD-10-CM | POA: Diagnosis not present

## 2023-12-13 DIAGNOSIS — Z931 Gastrostomy status: Secondary | ICD-10-CM | POA: Diagnosis not present

## 2023-12-13 DIAGNOSIS — D689 Coagulation defect, unspecified: Secondary | ICD-10-CM | POA: Diagnosis not present

## 2023-12-13 DIAGNOSIS — D509 Iron deficiency anemia, unspecified: Secondary | ICD-10-CM | POA: Diagnosis not present

## 2023-12-13 NOTE — Transitions of Care (Post Inpatient/ED Visit) (Signed)
   12/13/2023  Name: Samantha [name] MRN: [id number] DOB: 08/22/1946  Today's TOC FU Call Status: Today's TOC FU Call Status:: Unsuccessful Call (2nd Attempt) Unsuccessful Call (2nd Attempt) Date: 12/13/23  Attempted to reach the patient regarding the most recent Inpatient/ED visit.  Follow Up Plan: Additional outreach attempts will be made to reach the patient to complete the Transitions of Care (Post Inpatient/ED visit) call.   Tonia Frankel RN, CCM [place]  VBCI-Population Health RN Care Manager [phone]

## 2023-12-13 NOTE — Telephone Encounter (Signed)
 Copied from CRM [id number]. Topic: Clinical - Home Health Verbal Orders >> Dec 13, 2023  1:34 PM Hamdi H wrote: Caller/Agency: Valarie Garner [facility]  Callback Number: [phone] Service Requested: Skilled Nursing Frequency: 1 week 3 with 2 PRNs Any new concerns about the patient? Yes, hospitalized on 12/11/23 for constipation.

## 2023-12-13 NOTE — Telephone Encounter (Signed)
 Verbal orders given

## 2023-12-13 NOTE — Transitions of Care (Post Inpatient/ED Visit) (Signed)
   12/13/2023  Name: Abeni Maimone MRN: [id number] DOB: Jul 30, 1946  Today's TOC FU Call Status: Today's TOC FU Call Status:: Successful TOC FU Call Completed TOC FU Call Complete Date: 12/13/23 (Spoke with daugther, Bridgette Campus - states patient is at dialysis and denied need for any additonal calls - states patient is doing well)    Medications Reviewed Today: Daughter denied need for medication review or discharge summary review Medications Reviewed Today   Medications were not reviewed in this encounter      Daughter, Bridgette Campus denied need for the [facility] calls. Unable to complete assessment    Tonia Frankel RN, CCM [facility]  VBCI-Population Health RN Care Manager [phone]

## 2023-12-14 DIAGNOSIS — Z9049 Acquired absence of other specified parts of digestive tract: Secondary | ICD-10-CM | POA: Diagnosis not present

## 2023-12-14 DIAGNOSIS — Z431 Encounter for attention to gastrostomy: Secondary | ICD-10-CM | POA: Diagnosis not present

## 2023-12-14 DIAGNOSIS — M6281 Muscle weakness (generalized): Secondary | ICD-10-CM | POA: Diagnosis not present

## 2023-12-14 DIAGNOSIS — N186 End stage renal disease: Secondary | ICD-10-CM | POA: Diagnosis not present

## 2023-12-14 DIAGNOSIS — E46 Unspecified protein-calorie malnutrition: Secondary | ICD-10-CM | POA: Diagnosis not present

## 2023-12-15 DIAGNOSIS — D689 Coagulation defect, unspecified: Secondary | ICD-10-CM | POA: Diagnosis not present

## 2023-12-15 DIAGNOSIS — D631 Anemia in chronic kidney disease: Secondary | ICD-10-CM | POA: Diagnosis not present

## 2023-12-15 DIAGNOSIS — D509 Iron deficiency anemia, unspecified: Secondary | ICD-10-CM | POA: Diagnosis not present

## 2023-12-15 DIAGNOSIS — E1122 Type 2 diabetes mellitus with diabetic chronic kidney disease: Secondary | ICD-10-CM | POA: Diagnosis not present

## 2023-12-15 DIAGNOSIS — Z992 Dependence on renal dialysis: Secondary | ICD-10-CM | POA: Diagnosis not present

## 2023-12-15 DIAGNOSIS — R110 Nausea: Secondary | ICD-10-CM | POA: Diagnosis not present

## 2023-12-15 DIAGNOSIS — N2581 Secondary hyperparathyroidism of renal origin: Secondary | ICD-10-CM | POA: Diagnosis not present

## 2023-12-15 DIAGNOSIS — N186 End stage renal disease: Secondary | ICD-10-CM | POA: Diagnosis not present

## 2023-12-16 DIAGNOSIS — N186 End stage renal disease: Secondary | ICD-10-CM | POA: Diagnosis not present

## 2023-12-16 DIAGNOSIS — Z794 Long term (current) use of insulin: Secondary | ICD-10-CM | POA: Diagnosis not present

## 2023-12-16 DIAGNOSIS — K5900 Constipation, unspecified: Secondary | ICD-10-CM | POA: Diagnosis not present

## 2023-12-16 DIAGNOSIS — I252 Old myocardial infarction: Secondary | ICD-10-CM | POA: Diagnosis not present

## 2023-12-16 DIAGNOSIS — G309 Alzheimer's disease, unspecified: Secondary | ICD-10-CM | POA: Diagnosis not present

## 2023-12-16 DIAGNOSIS — E1142 Type 2 diabetes mellitus with diabetic polyneuropathy: Secondary | ICD-10-CM | POA: Diagnosis not present

## 2023-12-16 DIAGNOSIS — I69391 Dysphagia following cerebral infarction: Secondary | ICD-10-CM | POA: Diagnosis not present

## 2023-12-16 DIAGNOSIS — E1122 Type 2 diabetes mellitus with diabetic chronic kidney disease: Secondary | ICD-10-CM | POA: Diagnosis not present

## 2023-12-16 DIAGNOSIS — Z7901 Long term (current) use of anticoagulants: Secondary | ICD-10-CM | POA: Diagnosis not present

## 2023-12-16 DIAGNOSIS — Z86718 Personal history of other venous thrombosis and embolism: Secondary | ICD-10-CM | POA: Diagnosis not present

## 2023-12-16 DIAGNOSIS — Z931 Gastrostomy status: Secondary | ICD-10-CM | POA: Diagnosis not present

## 2023-12-16 DIAGNOSIS — Z992 Dependence on renal dialysis: Secondary | ICD-10-CM | POA: Diagnosis not present

## 2023-12-16 DIAGNOSIS — I5020 Unspecified systolic (congestive) heart failure: Secondary | ICD-10-CM | POA: Diagnosis not present

## 2023-12-16 DIAGNOSIS — Z556 Problems related to health literacy: Secondary | ICD-10-CM | POA: Diagnosis not present

## 2023-12-16 DIAGNOSIS — I69354 Hemiplegia and hemiparesis following cerebral infarction affecting left non-dominant side: Secondary | ICD-10-CM | POA: Diagnosis not present

## 2023-12-16 DIAGNOSIS — E039 Hypothyroidism, unspecified: Secondary | ICD-10-CM | POA: Diagnosis not present

## 2023-12-16 DIAGNOSIS — I132 Hypertensive heart and chronic kidney disease with heart failure and with stage 5 chronic kidney disease, or end stage renal disease: Secondary | ICD-10-CM | POA: Diagnosis not present

## 2023-12-17 DIAGNOSIS — R110 Nausea: Secondary | ICD-10-CM | POA: Diagnosis not present

## 2023-12-17 DIAGNOSIS — N186 End stage renal disease: Secondary | ICD-10-CM | POA: Diagnosis not present

## 2023-12-17 DIAGNOSIS — D509 Iron deficiency anemia, unspecified: Secondary | ICD-10-CM | POA: Diagnosis not present

## 2023-12-17 DIAGNOSIS — N2581 Secondary hyperparathyroidism of renal origin: Secondary | ICD-10-CM | POA: Diagnosis not present

## 2023-12-17 DIAGNOSIS — Z992 Dependence on renal dialysis: Secondary | ICD-10-CM | POA: Diagnosis not present

## 2023-12-17 DIAGNOSIS — D631 Anemia in chronic kidney disease: Secondary | ICD-10-CM | POA: Diagnosis not present

## 2023-12-17 DIAGNOSIS — E1122 Type 2 diabetes mellitus with diabetic chronic kidney disease: Secondary | ICD-10-CM | POA: Diagnosis not present

## 2023-12-17 DIAGNOSIS — D689 Coagulation defect, unspecified: Secondary | ICD-10-CM | POA: Diagnosis not present

## 2023-12-20 DIAGNOSIS — D689 Coagulation defect, unspecified: Secondary | ICD-10-CM | POA: Diagnosis not present

## 2023-12-20 DIAGNOSIS — Z992 Dependence on renal dialysis: Secondary | ICD-10-CM | POA: Diagnosis not present

## 2023-12-20 DIAGNOSIS — R110 Nausea: Secondary | ICD-10-CM | POA: Diagnosis not present

## 2023-12-20 DIAGNOSIS — N2581 Secondary hyperparathyroidism of renal origin: Secondary | ICD-10-CM | POA: Diagnosis not present

## 2023-12-20 DIAGNOSIS — D509 Iron deficiency anemia, unspecified: Secondary | ICD-10-CM | POA: Diagnosis not present

## 2023-12-20 DIAGNOSIS — N186 End stage renal disease: Secondary | ICD-10-CM | POA: Diagnosis not present

## 2023-12-20 DIAGNOSIS — D631 Anemia in chronic kidney disease: Secondary | ICD-10-CM | POA: Diagnosis not present

## 2023-12-20 DIAGNOSIS — E1122 Type 2 diabetes mellitus with diabetic chronic kidney disease: Secondary | ICD-10-CM | POA: Diagnosis not present

## 2023-12-22 DIAGNOSIS — D509 Iron deficiency anemia, unspecified: Secondary | ICD-10-CM | POA: Diagnosis not present

## 2023-12-22 DIAGNOSIS — R110 Nausea: Secondary | ICD-10-CM | POA: Diagnosis not present

## 2023-12-22 DIAGNOSIS — D631 Anemia in chronic kidney disease: Secondary | ICD-10-CM | POA: Diagnosis not present

## 2023-12-22 DIAGNOSIS — N2581 Secondary hyperparathyroidism of renal origin: Secondary | ICD-10-CM | POA: Diagnosis not present

## 2023-12-22 DIAGNOSIS — N186 End stage renal disease: Secondary | ICD-10-CM | POA: Diagnosis not present

## 2023-12-22 DIAGNOSIS — E1122 Type 2 diabetes mellitus with diabetic chronic kidney disease: Secondary | ICD-10-CM | POA: Diagnosis not present

## 2023-12-22 DIAGNOSIS — Z992 Dependence on renal dialysis: Secondary | ICD-10-CM | POA: Diagnosis not present

## 2023-12-22 DIAGNOSIS — D689 Coagulation defect, unspecified: Secondary | ICD-10-CM | POA: Diagnosis not present

## 2023-12-24 ENCOUNTER — Encounter: Payer: Self-pay | Admitting: Sports Medicine

## 2023-12-24 DIAGNOSIS — N2581 Secondary hyperparathyroidism of renal origin: Secondary | ICD-10-CM | POA: Diagnosis not present

## 2023-12-24 DIAGNOSIS — N186 End stage renal disease: Secondary | ICD-10-CM | POA: Diagnosis not present

## 2023-12-24 DIAGNOSIS — D689 Coagulation defect, unspecified: Secondary | ICD-10-CM | POA: Diagnosis not present

## 2023-12-24 DIAGNOSIS — E1122 Type 2 diabetes mellitus with diabetic chronic kidney disease: Secondary | ICD-10-CM | POA: Diagnosis not present

## 2023-12-24 DIAGNOSIS — Z992 Dependence on renal dialysis: Secondary | ICD-10-CM | POA: Diagnosis not present

## 2023-12-24 DIAGNOSIS — D509 Iron deficiency anemia, unspecified: Secondary | ICD-10-CM | POA: Diagnosis not present

## 2023-12-24 DIAGNOSIS — D631 Anemia in chronic kidney disease: Secondary | ICD-10-CM | POA: Diagnosis not present

## 2023-12-24 NOTE — Telephone Encounter (Signed)
 Message routed to PCP Tye Gall, MD . I have printed and placed in your review and sign folder for when you return into office.

## 2023-12-25 DIAGNOSIS — I69354 Hemiplegia and hemiparesis following cerebral infarction affecting left non-dominant side: Secondary | ICD-10-CM | POA: Diagnosis not present

## 2023-12-25 DIAGNOSIS — E039 Hypothyroidism, unspecified: Secondary | ICD-10-CM | POA: Diagnosis not present

## 2023-12-25 DIAGNOSIS — E1122 Type 2 diabetes mellitus with diabetic chronic kidney disease: Secondary | ICD-10-CM | POA: Diagnosis not present

## 2023-12-25 DIAGNOSIS — I132 Hypertensive heart and chronic kidney disease with heart failure and with stage 5 chronic kidney disease, or end stage renal disease: Secondary | ICD-10-CM | POA: Diagnosis not present

## 2023-12-25 DIAGNOSIS — Z556 Problems related to health literacy: Secondary | ICD-10-CM | POA: Diagnosis not present

## 2023-12-25 DIAGNOSIS — I252 Old myocardial infarction: Secondary | ICD-10-CM | POA: Diagnosis not present

## 2023-12-25 DIAGNOSIS — E1142 Type 2 diabetes mellitus with diabetic polyneuropathy: Secondary | ICD-10-CM | POA: Diagnosis not present

## 2023-12-25 DIAGNOSIS — Z86718 Personal history of other venous thrombosis and embolism: Secondary | ICD-10-CM | POA: Diagnosis not present

## 2023-12-25 DIAGNOSIS — Z7901 Long term (current) use of anticoagulants: Secondary | ICD-10-CM | POA: Diagnosis not present

## 2023-12-25 DIAGNOSIS — I5020 Unspecified systolic (congestive) heart failure: Secondary | ICD-10-CM | POA: Diagnosis not present

## 2023-12-25 DIAGNOSIS — G309 Alzheimer's disease, unspecified: Secondary | ICD-10-CM | POA: Diagnosis not present

## 2023-12-25 DIAGNOSIS — I69391 Dysphagia following cerebral infarction: Secondary | ICD-10-CM | POA: Diagnosis not present

## 2023-12-25 DIAGNOSIS — N186 End stage renal disease: Secondary | ICD-10-CM | POA: Diagnosis not present

## 2023-12-25 DIAGNOSIS — Z794 Long term (current) use of insulin: Secondary | ICD-10-CM | POA: Diagnosis not present

## 2023-12-25 DIAGNOSIS — K5900 Constipation, unspecified: Secondary | ICD-10-CM | POA: Diagnosis not present

## 2023-12-25 DIAGNOSIS — Z992 Dependence on renal dialysis: Secondary | ICD-10-CM | POA: Diagnosis not present

## 2023-12-25 DIAGNOSIS — Z931 Gastrostomy status: Secondary | ICD-10-CM | POA: Diagnosis not present

## 2023-12-27 ENCOUNTER — Telehealth: Admitting: *Deleted

## 2023-12-27 DIAGNOSIS — Z931 Gastrostomy status: Secondary | ICD-10-CM | POA: Diagnosis not present

## 2023-12-27 DIAGNOSIS — D689 Coagulation defect, unspecified: Secondary | ICD-10-CM | POA: Diagnosis not present

## 2023-12-27 DIAGNOSIS — N2581 Secondary hyperparathyroidism of renal origin: Secondary | ICD-10-CM | POA: Diagnosis not present

## 2023-12-27 DIAGNOSIS — Z992 Dependence on renal dialysis: Secondary | ICD-10-CM | POA: Diagnosis not present

## 2023-12-27 DIAGNOSIS — D509 Iron deficiency anemia, unspecified: Secondary | ICD-10-CM | POA: Diagnosis not present

## 2023-12-27 DIAGNOSIS — D631 Anemia in chronic kidney disease: Secondary | ICD-10-CM | POA: Diagnosis not present

## 2023-12-27 DIAGNOSIS — E1122 Type 2 diabetes mellitus with diabetic chronic kidney disease: Secondary | ICD-10-CM | POA: Diagnosis not present

## 2023-12-27 DIAGNOSIS — I222 Subsequent non-ST elevation (NSTEMI) myocardial infarction: Secondary | ICD-10-CM | POA: Diagnosis not present

## 2023-12-27 DIAGNOSIS — E7849 Other hyperlipidemia: Secondary | ICD-10-CM | POA: Diagnosis not present

## 2023-12-27 DIAGNOSIS — R1312 Dysphagia, oropharyngeal phase: Secondary | ICD-10-CM | POA: Diagnosis not present

## 2023-12-27 DIAGNOSIS — N186 End stage renal disease: Secondary | ICD-10-CM | POA: Diagnosis not present

## 2023-12-27 NOTE — Telephone Encounter (Signed)
 Copied from CRM [id number]. Topic: Referral - Request for Referral >> Courtny Bennison 5, 2025  9:18 AM Retta Caster wrote: Did the patient discuss referral with their provider in the last year? No (If No - schedule appointment) (If Yes - send message)  Appointment offered? No due to [facility] Nurse requesting [phone]  Type of order/referral and detailed reason for visit: Verbal- recertification for 1week 3 and 3 PRN  Preference of office, provider, location: N/A  If referral order, have you been seen by this specialty before? Yes (If Yes, this issue or another issue? When? Where?  Can we respond through MyChart? Yes

## 2023-12-27 NOTE — Telephone Encounter (Signed)
 Tried calling the number that was provided [phone] twice and it is not in Service.

## 2023-12-29 ENCOUNTER — Telehealth

## 2023-12-29 DIAGNOSIS — N186 End stage renal disease: Secondary | ICD-10-CM | POA: Diagnosis not present

## 2023-12-29 DIAGNOSIS — D509 Iron deficiency anemia, unspecified: Secondary | ICD-10-CM | POA: Diagnosis not present

## 2023-12-29 DIAGNOSIS — N2581 Secondary hyperparathyroidism of renal origin: Secondary | ICD-10-CM | POA: Diagnosis not present

## 2023-12-29 DIAGNOSIS — Z992 Dependence on renal dialysis: Secondary | ICD-10-CM | POA: Diagnosis not present

## 2023-12-29 DIAGNOSIS — D689 Coagulation defect, unspecified: Secondary | ICD-10-CM | POA: Diagnosis not present

## 2023-12-29 DIAGNOSIS — E1122 Type 2 diabetes mellitus with diabetic chronic kidney disease: Secondary | ICD-10-CM | POA: Diagnosis not present

## 2023-12-29 DIAGNOSIS — D631 Anemia in chronic kidney disease: Secondary | ICD-10-CM | POA: Diagnosis not present

## 2023-12-29 NOTE — Telephone Encounter (Signed)
 Form signed.

## 2023-12-29 NOTE — Telephone Encounter (Signed)
 Copied from CRM [id number]. Topic: Referral - Status >> Dec 29, 2023 10:39 AM Arlie Benedict B wrote: Reason for CRM: Ms. Valarie Garner from [facility] was calling to check status of a referral request in regards to patient.  After reviewing notes it was brought to my attention that CRM [id number] had an incorrect number due to transposing. Review more notes in chart and seen where the CMA had attempted to call but due to having the incorrect number she was unable to reach Ms. Mirian Ames. The number for Ms. Valarie Garner at [facility] is [phone]. She states if she is unavail. Please leave a message on her vm which is secured.   Call returned to [facility] and verbal orders given per [facility] standing protocol (refer to phone note dated 12/27/23)

## 2023-12-31 DIAGNOSIS — D689 Coagulation defect, unspecified: Secondary | ICD-10-CM | POA: Diagnosis not present

## 2023-12-31 DIAGNOSIS — N2581 Secondary hyperparathyroidism of renal origin: Secondary | ICD-10-CM | POA: Diagnosis not present

## 2023-12-31 DIAGNOSIS — E1122 Type 2 diabetes mellitus with diabetic chronic kidney disease: Secondary | ICD-10-CM | POA: Diagnosis not present

## 2023-12-31 DIAGNOSIS — D509 Iron deficiency anemia, unspecified: Secondary | ICD-10-CM | POA: Diagnosis not present

## 2023-12-31 DIAGNOSIS — D631 Anemia in chronic kidney disease: Secondary | ICD-10-CM | POA: Diagnosis not present

## 2023-12-31 DIAGNOSIS — Z992 Dependence on renal dialysis: Secondary | ICD-10-CM | POA: Diagnosis not present

## 2023-12-31 DIAGNOSIS — N186 End stage renal disease: Secondary | ICD-10-CM | POA: Diagnosis not present

## 2024-01-03 DIAGNOSIS — E1122 Type 2 diabetes mellitus with diabetic chronic kidney disease: Secondary | ICD-10-CM | POA: Diagnosis not present

## 2024-01-03 DIAGNOSIS — D631 Anemia in chronic kidney disease: Secondary | ICD-10-CM | POA: Diagnosis not present

## 2024-01-03 DIAGNOSIS — Z992 Dependence on renal dialysis: Secondary | ICD-10-CM | POA: Diagnosis not present

## 2024-01-03 DIAGNOSIS — D689 Coagulation defect, unspecified: Secondary | ICD-10-CM | POA: Diagnosis not present

## 2024-01-03 DIAGNOSIS — N2581 Secondary hyperparathyroidism of renal origin: Secondary | ICD-10-CM | POA: Diagnosis not present

## 2024-01-03 DIAGNOSIS — N186 End stage renal disease: Secondary | ICD-10-CM | POA: Diagnosis not present

## 2024-01-03 DIAGNOSIS — D509 Iron deficiency anemia, unspecified: Secondary | ICD-10-CM | POA: Diagnosis not present

## 2024-01-05 DIAGNOSIS — N186 End stage renal disease: Secondary | ICD-10-CM | POA: Diagnosis not present

## 2024-01-05 DIAGNOSIS — D631 Anemia in chronic kidney disease: Secondary | ICD-10-CM | POA: Diagnosis not present

## 2024-01-05 DIAGNOSIS — D689 Coagulation defect, unspecified: Secondary | ICD-10-CM | POA: Diagnosis not present

## 2024-01-05 DIAGNOSIS — E1122 Type 2 diabetes mellitus with diabetic chronic kidney disease: Secondary | ICD-10-CM | POA: Diagnosis not present

## 2024-01-05 DIAGNOSIS — Z992 Dependence on renal dialysis: Secondary | ICD-10-CM | POA: Diagnosis not present

## 2024-01-05 DIAGNOSIS — N2581 Secondary hyperparathyroidism of renal origin: Secondary | ICD-10-CM | POA: Diagnosis not present

## 2024-01-05 DIAGNOSIS — D509 Iron deficiency anemia, unspecified: Secondary | ICD-10-CM | POA: Diagnosis not present

## 2024-01-06 DIAGNOSIS — E871 Hypo-osmolality and hyponatremia: Secondary | ICD-10-CM | POA: Diagnosis not present

## 2024-01-06 DIAGNOSIS — E039 Hypothyroidism, unspecified: Secondary | ICD-10-CM | POA: Diagnosis not present

## 2024-01-06 DIAGNOSIS — G309 Alzheimer's disease, unspecified: Secondary | ICD-10-CM | POA: Diagnosis not present

## 2024-01-06 DIAGNOSIS — Z931 Gastrostomy status: Secondary | ICD-10-CM | POA: Diagnosis not present

## 2024-01-06 DIAGNOSIS — E1142 Type 2 diabetes mellitus with diabetic polyneuropathy: Secondary | ICD-10-CM | POA: Diagnosis not present

## 2024-01-06 DIAGNOSIS — I69391 Dysphagia following cerebral infarction: Secondary | ICD-10-CM | POA: Diagnosis not present

## 2024-01-06 DIAGNOSIS — N186 End stage renal disease: Secondary | ICD-10-CM | POA: Diagnosis not present

## 2024-01-06 DIAGNOSIS — Z86718 Personal history of other venous thrombosis and embolism: Secondary | ICD-10-CM | POA: Diagnosis not present

## 2024-01-06 DIAGNOSIS — I69354 Hemiplegia and hemiparesis following cerebral infarction affecting left non-dominant side: Secondary | ICD-10-CM | POA: Diagnosis not present

## 2024-01-06 DIAGNOSIS — E1122 Type 2 diabetes mellitus with diabetic chronic kidney disease: Secondary | ICD-10-CM | POA: Diagnosis not present

## 2024-01-06 DIAGNOSIS — R6510 Systemic inflammatory response syndrome (SIRS) of non-infectious origin without acute organ dysfunction: Secondary | ICD-10-CM | POA: Diagnosis not present

## 2024-01-06 DIAGNOSIS — Z794 Long term (current) use of insulin: Secondary | ICD-10-CM | POA: Diagnosis not present

## 2024-01-06 DIAGNOSIS — E1165 Type 2 diabetes mellitus with hyperglycemia: Secondary | ICD-10-CM | POA: Diagnosis not present

## 2024-01-06 DIAGNOSIS — Z7901 Long term (current) use of anticoagulants: Secondary | ICD-10-CM | POA: Diagnosis not present

## 2024-01-06 DIAGNOSIS — I252 Old myocardial infarction: Secondary | ICD-10-CM | POA: Diagnosis not present

## 2024-01-06 DIAGNOSIS — K5900 Constipation, unspecified: Secondary | ICD-10-CM | POA: Diagnosis not present

## 2024-01-06 DIAGNOSIS — I5020 Unspecified systolic (congestive) heart failure: Secondary | ICD-10-CM | POA: Diagnosis not present

## 2024-01-06 DIAGNOSIS — I700 Atherosclerosis of aorta: Secondary | ICD-10-CM | POA: Diagnosis not present

## 2024-01-06 DIAGNOSIS — Z992 Dependence on renal dialysis: Secondary | ICD-10-CM | POA: Diagnosis not present

## 2024-01-06 DIAGNOSIS — I132 Hypertensive heart and chronic kidney disease with heart failure and with stage 5 chronic kidney disease, or end stage renal disease: Secondary | ICD-10-CM | POA: Diagnosis not present

## 2024-01-06 DIAGNOSIS — K5289 Other specified noninfective gastroenteritis and colitis: Secondary | ICD-10-CM | POA: Diagnosis not present

## 2024-01-06 DIAGNOSIS — D631 Anemia in chronic kidney disease: Secondary | ICD-10-CM | POA: Diagnosis not present

## 2024-01-06 DIAGNOSIS — D61818 Other pancytopenia: Secondary | ICD-10-CM | POA: Diagnosis not present

## 2024-01-07 DIAGNOSIS — N2581 Secondary hyperparathyroidism of renal origin: Secondary | ICD-10-CM | POA: Diagnosis not present

## 2024-01-07 DIAGNOSIS — D631 Anemia in chronic kidney disease: Secondary | ICD-10-CM | POA: Diagnosis not present

## 2024-01-07 DIAGNOSIS — N186 End stage renal disease: Secondary | ICD-10-CM | POA: Diagnosis not present

## 2024-01-07 DIAGNOSIS — Z992 Dependence on renal dialysis: Secondary | ICD-10-CM | POA: Diagnosis not present

## 2024-01-07 DIAGNOSIS — D689 Coagulation defect, unspecified: Secondary | ICD-10-CM | POA: Diagnosis not present

## 2024-01-07 DIAGNOSIS — D509 Iron deficiency anemia, unspecified: Secondary | ICD-10-CM | POA: Diagnosis not present

## 2024-01-07 DIAGNOSIS — E1122 Type 2 diabetes mellitus with diabetic chronic kidney disease: Secondary | ICD-10-CM | POA: Diagnosis not present

## 2024-01-10 DIAGNOSIS — E1122 Type 2 diabetes mellitus with diabetic chronic kidney disease: Secondary | ICD-10-CM | POA: Diagnosis not present

## 2024-01-10 DIAGNOSIS — N2581 Secondary hyperparathyroidism of renal origin: Secondary | ICD-10-CM | POA: Diagnosis not present

## 2024-01-10 DIAGNOSIS — D689 Coagulation defect, unspecified: Secondary | ICD-10-CM | POA: Diagnosis not present

## 2024-01-10 DIAGNOSIS — D631 Anemia in chronic kidney disease: Secondary | ICD-10-CM | POA: Diagnosis not present

## 2024-01-10 DIAGNOSIS — D509 Iron deficiency anemia, unspecified: Secondary | ICD-10-CM | POA: Diagnosis not present

## 2024-01-10 DIAGNOSIS — N186 End stage renal disease: Secondary | ICD-10-CM | POA: Diagnosis not present

## 2024-01-10 DIAGNOSIS — Z992 Dependence on renal dialysis: Secondary | ICD-10-CM | POA: Diagnosis not present

## 2024-01-11 DIAGNOSIS — E871 Hypo-osmolality and hyponatremia: Secondary | ICD-10-CM | POA: Diagnosis not present

## 2024-01-11 DIAGNOSIS — I700 Atherosclerosis of aorta: Secondary | ICD-10-CM | POA: Diagnosis not present

## 2024-01-11 DIAGNOSIS — E039 Hypothyroidism, unspecified: Secondary | ICD-10-CM | POA: Diagnosis not present

## 2024-01-11 DIAGNOSIS — E1165 Type 2 diabetes mellitus with hyperglycemia: Secondary | ICD-10-CM | POA: Diagnosis not present

## 2024-01-11 DIAGNOSIS — N186 End stage renal disease: Secondary | ICD-10-CM | POA: Diagnosis not present

## 2024-01-11 DIAGNOSIS — Z794 Long term (current) use of insulin: Secondary | ICD-10-CM | POA: Diagnosis not present

## 2024-01-11 DIAGNOSIS — I5020 Unspecified systolic (congestive) heart failure: Secondary | ICD-10-CM | POA: Diagnosis not present

## 2024-01-11 DIAGNOSIS — I69391 Dysphagia following cerebral infarction: Secondary | ICD-10-CM | POA: Diagnosis not present

## 2024-01-11 DIAGNOSIS — R1312 Dysphagia, oropharyngeal phase: Secondary | ICD-10-CM | POA: Diagnosis not present

## 2024-01-11 DIAGNOSIS — E7849 Other hyperlipidemia: Secondary | ICD-10-CM | POA: Diagnosis not present

## 2024-01-11 DIAGNOSIS — I69354 Hemiplegia and hemiparesis following cerebral infarction affecting left non-dominant side: Secondary | ICD-10-CM | POA: Diagnosis not present

## 2024-01-11 DIAGNOSIS — E1142 Type 2 diabetes mellitus with diabetic polyneuropathy: Secondary | ICD-10-CM | POA: Diagnosis not present

## 2024-01-11 DIAGNOSIS — I252 Old myocardial infarction: Secondary | ICD-10-CM | POA: Diagnosis not present

## 2024-01-11 DIAGNOSIS — Z992 Dependence on renal dialysis: Secondary | ICD-10-CM | POA: Diagnosis not present

## 2024-01-11 DIAGNOSIS — I132 Hypertensive heart and chronic kidney disease with heart failure and with stage 5 chronic kidney disease, or end stage renal disease: Secondary | ICD-10-CM | POA: Diagnosis not present

## 2024-01-11 DIAGNOSIS — D61818 Other pancytopenia: Secondary | ICD-10-CM | POA: Diagnosis not present

## 2024-01-11 DIAGNOSIS — G309 Alzheimer's disease, unspecified: Secondary | ICD-10-CM | POA: Diagnosis not present

## 2024-01-11 DIAGNOSIS — Z86718 Personal history of other venous thrombosis and embolism: Secondary | ICD-10-CM | POA: Diagnosis not present

## 2024-01-11 DIAGNOSIS — R6510 Systemic inflammatory response syndrome (SIRS) of non-infectious origin without acute organ dysfunction: Secondary | ICD-10-CM | POA: Diagnosis not present

## 2024-01-11 DIAGNOSIS — K5289 Other specified noninfective gastroenteritis and colitis: Secondary | ICD-10-CM | POA: Diagnosis not present

## 2024-01-11 DIAGNOSIS — Z931 Gastrostomy status: Secondary | ICD-10-CM | POA: Diagnosis not present

## 2024-01-11 DIAGNOSIS — Z7901 Long term (current) use of anticoagulants: Secondary | ICD-10-CM | POA: Diagnosis not present

## 2024-01-11 DIAGNOSIS — D631 Anemia in chronic kidney disease: Secondary | ICD-10-CM | POA: Diagnosis not present

## 2024-01-11 DIAGNOSIS — K5900 Constipation, unspecified: Secondary | ICD-10-CM | POA: Diagnosis not present

## 2024-01-11 DIAGNOSIS — E1122 Type 2 diabetes mellitus with diabetic chronic kidney disease: Secondary | ICD-10-CM | POA: Diagnosis not present

## 2024-01-11 DIAGNOSIS — I222 Subsequent non-ST elevation (NSTEMI) myocardial infarction: Secondary | ICD-10-CM | POA: Diagnosis not present

## 2024-01-12 DIAGNOSIS — E7849 Other hyperlipidemia: Secondary | ICD-10-CM | POA: Diagnosis not present

## 2024-01-12 DIAGNOSIS — I222 Subsequent non-ST elevation (NSTEMI) myocardial infarction: Secondary | ICD-10-CM | POA: Diagnosis not present

## 2024-01-12 DIAGNOSIS — D631 Anemia in chronic kidney disease: Secondary | ICD-10-CM | POA: Diagnosis not present

## 2024-01-12 DIAGNOSIS — N2581 Secondary hyperparathyroidism of renal origin: Secondary | ICD-10-CM | POA: Diagnosis not present

## 2024-01-12 DIAGNOSIS — D689 Coagulation defect, unspecified: Secondary | ICD-10-CM | POA: Diagnosis not present

## 2024-01-12 DIAGNOSIS — D509 Iron deficiency anemia, unspecified: Secondary | ICD-10-CM | POA: Diagnosis not present

## 2024-01-12 DIAGNOSIS — N186 End stage renal disease: Secondary | ICD-10-CM | POA: Diagnosis not present

## 2024-01-12 DIAGNOSIS — Z992 Dependence on renal dialysis: Secondary | ICD-10-CM | POA: Diagnosis not present

## 2024-01-12 DIAGNOSIS — E1122 Type 2 diabetes mellitus with diabetic chronic kidney disease: Secondary | ICD-10-CM | POA: Diagnosis not present

## 2024-01-12 DIAGNOSIS — R1312 Dysphagia, oropharyngeal phase: Secondary | ICD-10-CM | POA: Diagnosis not present

## 2024-01-12 DIAGNOSIS — Z931 Gastrostomy status: Secondary | ICD-10-CM | POA: Diagnosis not present

## 2024-01-13 DIAGNOSIS — Z431 Encounter for attention to gastrostomy: Secondary | ICD-10-CM | POA: Diagnosis not present

## 2024-01-13 DIAGNOSIS — E46 Unspecified protein-calorie malnutrition: Secondary | ICD-10-CM | POA: Diagnosis not present

## 2024-01-13 DIAGNOSIS — M6281 Muscle weakness (generalized): Secondary | ICD-10-CM | POA: Diagnosis not present

## 2024-01-13 DIAGNOSIS — N186 End stage renal disease: Secondary | ICD-10-CM | POA: Diagnosis not present

## 2024-01-13 DIAGNOSIS — Z9049 Acquired absence of other specified parts of digestive tract: Secondary | ICD-10-CM | POA: Diagnosis not present

## 2024-01-13 LAB — LAB REPORT - SCANNED: A1c: 6.4

## 2024-01-14 DIAGNOSIS — D631 Anemia in chronic kidney disease: Secondary | ICD-10-CM | POA: Diagnosis not present

## 2024-01-14 DIAGNOSIS — N186 End stage renal disease: Secondary | ICD-10-CM | POA: Diagnosis not present

## 2024-01-14 DIAGNOSIS — N2581 Secondary hyperparathyroidism of renal origin: Secondary | ICD-10-CM | POA: Diagnosis not present

## 2024-01-14 DIAGNOSIS — D689 Coagulation defect, unspecified: Secondary | ICD-10-CM | POA: Diagnosis not present

## 2024-01-14 DIAGNOSIS — E1122 Type 2 diabetes mellitus with diabetic chronic kidney disease: Secondary | ICD-10-CM | POA: Diagnosis not present

## 2024-01-14 DIAGNOSIS — D509 Iron deficiency anemia, unspecified: Secondary | ICD-10-CM | POA: Diagnosis not present

## 2024-01-14 DIAGNOSIS — Z992 Dependence on renal dialysis: Secondary | ICD-10-CM | POA: Diagnosis not present

## 2024-01-17 DIAGNOSIS — N2581 Secondary hyperparathyroidism of renal origin: Secondary | ICD-10-CM | POA: Diagnosis not present

## 2024-01-17 DIAGNOSIS — D631 Anemia in chronic kidney disease: Secondary | ICD-10-CM | POA: Diagnosis not present

## 2024-01-17 DIAGNOSIS — D509 Iron deficiency anemia, unspecified: Secondary | ICD-10-CM | POA: Diagnosis not present

## 2024-01-17 DIAGNOSIS — E1122 Type 2 diabetes mellitus with diabetic chronic kidney disease: Secondary | ICD-10-CM | POA: Diagnosis not present

## 2024-01-17 DIAGNOSIS — D689 Coagulation defect, unspecified: Secondary | ICD-10-CM | POA: Diagnosis not present

## 2024-01-17 DIAGNOSIS — N186 End stage renal disease: Secondary | ICD-10-CM | POA: Diagnosis not present

## 2024-01-17 DIAGNOSIS — Z992 Dependence on renal dialysis: Secondary | ICD-10-CM | POA: Diagnosis not present

## 2024-01-18 ENCOUNTER — Encounter: Payer: Self-pay | Admitting: Sports Medicine

## 2024-01-18 ENCOUNTER — Ambulatory Visit (INDEPENDENT_AMBULATORY_CARE_PROVIDER_SITE_OTHER): Payer: 59 | Admitting: Sports Medicine

## 2024-01-18 VITALS — BP 110/68 | HR 86 | Temp 96.4°F | Ht 59.0 in | Wt 125.0 lb

## 2024-01-18 DIAGNOSIS — K5289 Other specified noninfective gastroenteritis and colitis: Secondary | ICD-10-CM

## 2024-01-18 DIAGNOSIS — Z780 Asymptomatic menopausal state: Secondary | ICD-10-CM

## 2024-01-18 DIAGNOSIS — N186 End stage renal disease: Secondary | ICD-10-CM

## 2024-01-18 DIAGNOSIS — I6990 Unspecified sequelae of unspecified cerebrovascular disease: Secondary | ICD-10-CM | POA: Diagnosis not present

## 2024-01-18 DIAGNOSIS — F0280 Dementia in other diseases classified elsewhere without behavioral disturbance: Secondary | ICD-10-CM

## 2024-01-18 DIAGNOSIS — E1122 Type 2 diabetes mellitus with diabetic chronic kidney disease: Secondary | ICD-10-CM

## 2024-01-18 DIAGNOSIS — Z794 Long term (current) use of insulin: Secondary | ICD-10-CM

## 2024-01-18 DIAGNOSIS — Z931 Gastrostomy status: Secondary | ICD-10-CM

## 2024-01-18 DIAGNOSIS — G309 Alzheimer's disease, unspecified: Secondary | ICD-10-CM

## 2024-01-18 DIAGNOSIS — Z992 Dependence on renal dialysis: Secondary | ICD-10-CM

## 2024-01-18 NOTE — Progress Notes (Signed)
 Careteam: Patient Care Team: Tye Gall, MD as PCP - General (Internal Medicine)  PLACE OF SERVICE:  [facility] CLINIC  Advanced Directive information    Allergies  Allergen Reactions   Asa [Aspirin] Anaphylaxis   Penicillins Anaphylaxis    **Tolerates cephalosporins    Chief Complaint  Patient presents with   Follow-up    3 month follow up pt states that she has no new concerns      Discussed the use of AI scribe software for clinical note transcription with the patient, who gave verbal consent to proceed.  History of Present Illness    Samantha [name] is a 78 year old female with hypertension and diabetes who presents for follow-up after hospitalization for colitis. Pt is accompanied by her daughter. Daughter states that pt is doing fine since discharge.   Daughter has been checking blood pressure 2-3 times a day and is concerned about intermittent high readings.  Pt denies being dizzy or lightheaded.  She manages her diabetes with a sliding scale insulin  regimen, taking 14 units of Lantus  at night, She follows up with an endocrinologist for her diabetes management.  Her current medications include Eliquis  2.5 mg, atorvastatin  at bedtime, and carvedilol . She also takes a multivitamin with fiber, which is a recent addition to her regimen.  She is bedbound and requires assistance with transfers using a Hoyer lift. She receives home health aide services for 50 hours a week, assisting with bathing and cleaning. She is able to eat minimal amounts of soft foods and receives nighttime feedings through a PEG tube.  Review of Systems:  Review of Systems  Constitutional:  Negative for fever.  Eyes:  Negative for double vision.  Respiratory:  Negative for cough, sputum production and shortness of breath.   Cardiovascular:  Negative for chest pain and leg swelling.  Gastrointestinal:  Negative for abdominal pain, heartburn and nausea.  Genitourinary:  Negative for dysuria,  frequency and hematuria.  Neurological:  Negative for dizziness.   Negative unless indicated in HPI.   Past Medical History:  Diagnosis Date   Cerebrovascular accident (CVA) due to occlusion of cerebral artery (HCC) 07/12/2019   CVA (cerebral vascular accident) (HCC)    residual left sided weakness   Dementia (HCC)    Diabetes (HCC)    Diabetes mellitus without complication (HCC)    ESRD (end stage renal disease) on dialysis (HCC)    Heart failure (HCC)    History of CT scan    History of heart attack    History of MRI    History of stroke    HTN (hypertension)    Kidney failure    PEG (percutaneous endoscopic gastrostomy) status (HCC)    Sepsis (HCC) 04/01/2023   SIRS (systemic inflammatory response syndrome) (HCC) 04/06/2023   Thyroid  disease    Past Surgical History:  Procedure Laterality Date   ABDOMINAL HYSTERECTOMY     GALLBLADDER SURGERY     IR EXCHANGE BILIARY DRAIN  02/11/2023   IR EXCHANGE BILIARY DRAIN  04/13/2023   IR EXCHANGE BILIARY DRAIN  06/15/2023   IR EXCHANGE BILIARY DRAIN  08/23/2023   IR EXCHANGE BILIARY DRAIN  10/05/2023   IR EXCHANGE BILIARY DRAIN  11/30/2023   IR PERC CHOLECYSTOSTOMY  12/16/2022   IR REPLACE G-TUBE SIMPLE WO FLUORO  08/23/2023   IR REPLC GASTRO/COLONIC TUBE PERCUT W/FLUORO  06/15/2023   IR REPLC GASTRO/COLONIC TUBE PERCUT W/FLUORO  10/05/2023   IR REPLC GASTRO/COLONIC TUBE PERCUT W/FLUORO  11/30/2023   Social  History:   reports that she has never smoked. She does not have any smokeless tobacco history on file. She reports that she does not currently use alcohol. She reports that she does not use drugs.  Family History  Problem Relation Age of Onset   Diabetes Mother     Medications: Patient's Medications  New Prescriptions   No medications on file  Previous Medications   ACETAMINOPHEN  (TYLENOL ) 500 MG TABLET    Take 500 mg by mouth daily as needed for mild pain (pain score 1-3).   APIXABAN  (ELIQUIS ) 2.5 MG TABS TABLET    Take  1 tablet (2.5 mg total) by mouth 2 (two) times daily.   ATORVASTATIN  (LIPITOR) 10 MG TABLET    Take 1 tablet (10 mg total) by mouth daily.   CARVEDILOL  (COREG ) 3.125 MG TABLET    Take 3.125 mg Q AM  and 3.125 mg Q PM on non dialysis days (Tues, Thurs, Sat and Sun,  - On dialysis days,  MWF, continue Coreg  3.125 mg Q PM -  Hold for SBP < 100   GLUCAGON (BAQSIMI ONE PACK) 3 MG/DOSE POWD    Place 1 packet into the nose as needed (For blood sugar). Use when blood sugar drops below 70.   INSULIN  ASPART (NOVOLOG ) 100 UNIT/ML INJECTION    0-9 Units, Subcutaneous, 3 times daily with meals, CBG < 70: Implement Hypoglycemia measures CBG 70 - 120: 0 units CBG 121 - 150: 1 unit CBG 151 - 200: 2 units CBG 201 - 250: 3 units CBG 251 - 300: 5 units CBG 301 - 350: 7 units CBG 351 - 400: 9 units CBG > 400: call MD   INSULIN  GLARGINE (LANTUS ) 100 UNIT/ML SOLOSTAR PEN    Inject 15 Units into the skin at bedtime.   MULTIPLE VITAMINS-MINERALS (MULTIVITAMIN WITH MINERALS) TABLET    Take 1 tablet by mouth at bedtime.   NUTRITIONAL SUPPLEMENTS (FEEDING SUPPLEMENT, OSMOLITE 1.5 CAL,) LIQD    Place 600 mLs into feeding tube See admin instructions. 8p-6a   POLYETHYLENE GLYCOL POWDER (MIRALAX ) 17 GM/SCOOP POWDER    Take 17 g by mouth 2 (two) times daily as needed for moderate constipation or mild constipation.   PROMETHAZINE  (PHENERGAN ) 25 MG TABLET    Take 0.5 tablets (12.5 mg total) by mouth every 6 (six) hours as needed for nausea or vomiting.   SENNA-DOCUSATE (SENOKOT-S) 8.6-50 MG TABLET    Take 1-2 tablets by mouth 2 (two) times daily between meals as needed for mild constipation.  Modified Medications   No medications on file  Discontinued Medications   No medications on file    Physical Exam: There were no vitals filed for this visit. There is no height or weight on file to calculate BMI. BP Readings from Last 3 Encounters:  12/09/23 (!) 145/107  10/19/23 128/70  09/27/23 103/61   Wt Readings from Last 3  Encounters:  12/09/23 125 lb (56.7 kg)  10/19/23 132 lb (59.9 kg)  09/26/23 124 lb 5.4 oz (56.4 kg)    Physical Exam Constitutional:      Appearance: Normal appearance.  HENT:     Head: Normocephalic and atraumatic.  Cardiovascular:     Rate and Rhythm: Normal rate and regular rhythm.  Pulmonary:     Effort: Pulmonary effort is normal. No respiratory distress.     Breath sounds: Normal breath sounds. No wheezing.  Abdominal:     General: Bowel sounds are normal. There is no distension.  Tenderness: There is no abdominal tenderness. There is no guarding or rebound.     Comments:    Musculoskeletal:        General: No swelling.  Neurological:     Mental Status: She is alert. Mental status is at baseline.     Motor: No weakness.     Labs reviewed: Basic Metabolic Panel: Recent Labs    03/25/23 1604 03/31/23 0050 04/06/23 0311 04/07/23 0610 06/16/23 2226 09/01/23 1954 09/27/23 0406 09/27/23 0407 12/07/23 0409 12/08/23 0357 12/09/23 0335  NA 138   < > 134*   < > 137   < >  --    < > 135 131* 133*  K 4.0   < > 3.3*   < > 3.3*   < >  --    < > 5.5* 4.2 4.0  CL 95*   < > 99   < > 94*   < >  --    < > 97* 95* 94*  CO2 35*   < > 29   < > 31   < >  --    < > 25 27 25   GLUCOSE 97   < > 163*   < > 161*   < >  --    < > 114* 278* 276*  BUN 14   < > 14   < > 18   < >  --    < > 31* 13 10  CREATININE 2.78*   < > 1.79*   < > 2.29*   < >  --    < > 3.92* 2.48* 2.21*  CALCIUM  9.5   < > 8.4*   < > 8.7*   < >  --    < > 9.1 8.9 8.7*  MG  --    < > 1.6*  --  2.3  --  2.1  --   --   --   --   PHOS  --    < > 1.9*   < >  --    < >  --    < > 3.9 3.4 3.0  TSH 2.78  --   --   --  3.036  --   --   --   --  3.990  --    < > = values in this interval not displayed.   Liver Function Tests: Recent Labs    09/25/23 0206 09/26/23 0605 12/06/23 0730 12/07/23 0406 12/07/23 0409 12/08/23 0357 12/09/23 0335  AST 30  --  30 33  --   --   --   ALT 20  --  28 29  --   --   --   ALKPHOS  153*  --  159* 146*  --   --   --   BILITOT 0.4  --  0.6 0.5  --   --   --   PROT 6.6  --  7.1 6.9  --   --   --   ALBUMIN 3.2*   < > 3.1* 3.1* 3.1* 2.7* 2.7*   < > = values in this interval not displayed.   Recent Labs    06/16/23 2226 09/01/23 1954 09/25/23 0206  LIPASE 60* 55* 60*   No results for input(s): "AMMONIA" in the last 8760 hours. CBC: Recent Labs    09/25/23 0206 09/25/23 1239 12/06/23 0701 12/07/23 0408 12/08/23 0357 12/09/23 0335  WBC 8.1   < > 9.0 5.9 6.2 5.3  NEUTROABS 3.9  --  5.4  --  3.7  --   HGB 9.6*   < > 10.7* 10.0* 8.6* 8.7*  HCT 31.4*   < > 32.7* 31.1* 26.6* 26.7*  MCV 99.1   < > 92.4 91.7 90.5 91.1  PLT 143*   < > 149* 121* 138* 148*   < > = values in this interval not displayed.   Lipid Panel: Recent Labs    03/25/23 1604  CHOL 142  HDL 40.40  LDLCALC 70  TRIG 158.0*  CHOLHDL 4   TSH: Recent Labs    03/25/23 1604 06/16/23 2226 12/08/23 0357  TSH 2.78 3.036 3.990   A1C: Lab Results  Component Value Date   HGBA1C 6.6 (H) 09/25/2023    Assessment and Plan Assessment & Plan  1. Stercoral colitis (Primary)  Pt doing fine Denies abdominal pain, nausea, vomiting, diarrhea Pt had labs drawn at dialysis, will request records  - Ambulatory referral to Gastroenterology  2. ESRD (end stage renal disease) (HCC)  Follow up with nephrology  3. Dementia due to Alzheimer's disease (HCC)  Cont with supportive care  4. Type 2 diabetes mellitus with chronic kidney disease on chronic dialysis, with long-term current use of insulin  [facility])  Ambulatory referral to Ophthalmology  5. Late effects of CVA (cerebrovascular accident)  Cont with lipitor  6. G tube feedings (HCC)  No issues  7. Postmenopausal estrogen deficiency   - DG BONE DENSITY (DXA); Future

## 2024-01-19 DIAGNOSIS — Z992 Dependence on renal dialysis: Secondary | ICD-10-CM | POA: Diagnosis not present

## 2024-01-19 DIAGNOSIS — N186 End stage renal disease: Secondary | ICD-10-CM | POA: Diagnosis not present

## 2024-01-19 DIAGNOSIS — E1122 Type 2 diabetes mellitus with diabetic chronic kidney disease: Secondary | ICD-10-CM | POA: Diagnosis not present

## 2024-01-19 DIAGNOSIS — N2581 Secondary hyperparathyroidism of renal origin: Secondary | ICD-10-CM | POA: Diagnosis not present

## 2024-01-19 DIAGNOSIS — D509 Iron deficiency anemia, unspecified: Secondary | ICD-10-CM | POA: Diagnosis not present

## 2024-01-19 DIAGNOSIS — D631 Anemia in chronic kidney disease: Secondary | ICD-10-CM | POA: Diagnosis not present

## 2024-01-19 DIAGNOSIS — D689 Coagulation defect, unspecified: Secondary | ICD-10-CM | POA: Diagnosis not present

## 2024-01-21 DIAGNOSIS — D689 Coagulation defect, unspecified: Secondary | ICD-10-CM | POA: Diagnosis not present

## 2024-01-21 DIAGNOSIS — D509 Iron deficiency anemia, unspecified: Secondary | ICD-10-CM | POA: Diagnosis not present

## 2024-01-21 DIAGNOSIS — N2581 Secondary hyperparathyroidism of renal origin: Secondary | ICD-10-CM | POA: Diagnosis not present

## 2024-01-21 DIAGNOSIS — E1122 Type 2 diabetes mellitus with diabetic chronic kidney disease: Secondary | ICD-10-CM | POA: Diagnosis not present

## 2024-01-21 DIAGNOSIS — N186 End stage renal disease: Secondary | ICD-10-CM | POA: Diagnosis not present

## 2024-01-21 DIAGNOSIS — D631 Anemia in chronic kidney disease: Secondary | ICD-10-CM | POA: Diagnosis not present

## 2024-01-21 DIAGNOSIS — Z992 Dependence on renal dialysis: Secondary | ICD-10-CM | POA: Diagnosis not present

## 2024-01-22 DIAGNOSIS — E1122 Type 2 diabetes mellitus with diabetic chronic kidney disease: Secondary | ICD-10-CM | POA: Diagnosis not present

## 2024-01-22 DIAGNOSIS — Z992 Dependence on renal dialysis: Secondary | ICD-10-CM | POA: Diagnosis not present

## 2024-01-22 DIAGNOSIS — N186 End stage renal disease: Secondary | ICD-10-CM | POA: Diagnosis not present

## 2024-01-24 DIAGNOSIS — N2581 Secondary hyperparathyroidism of renal origin: Secondary | ICD-10-CM | POA: Diagnosis not present

## 2024-01-24 DIAGNOSIS — D689 Coagulation defect, unspecified: Secondary | ICD-10-CM | POA: Diagnosis not present

## 2024-01-24 DIAGNOSIS — R110 Nausea: Secondary | ICD-10-CM | POA: Diagnosis not present

## 2024-01-24 DIAGNOSIS — D509 Iron deficiency anemia, unspecified: Secondary | ICD-10-CM | POA: Diagnosis not present

## 2024-01-24 DIAGNOSIS — Z992 Dependence on renal dialysis: Secondary | ICD-10-CM | POA: Diagnosis not present

## 2024-01-24 DIAGNOSIS — D631 Anemia in chronic kidney disease: Secondary | ICD-10-CM | POA: Diagnosis not present

## 2024-01-24 DIAGNOSIS — N186 End stage renal disease: Secondary | ICD-10-CM | POA: Diagnosis not present

## 2024-01-25 ENCOUNTER — Other Ambulatory Visit (HOSPITAL_COMMUNITY): Payer: Self-pay | Admitting: Diagnostic Radiology

## 2024-01-25 ENCOUNTER — Other Ambulatory Visit (HOSPITAL_COMMUNITY)

## 2024-01-25 ENCOUNTER — Ambulatory Visit (HOSPITAL_COMMUNITY)
Admission: RE | Admit: 2024-01-25 | Discharge: 2024-01-25 | Disposition: A | Discharge status: Home / Self Care | Source: Ambulatory Visit | Attending: Diagnostic Radiology | Admitting: Diagnostic Radiology

## 2024-01-25 DIAGNOSIS — R1032 Left lower quadrant pain: Secondary | ICD-10-CM | POA: Insufficient documentation

## 2024-01-25 DIAGNOSIS — Z434 Encounter for attention to other artificial openings of digestive tract: Secondary | ICD-10-CM | POA: Diagnosis not present

## 2024-01-25 HISTORY — PX: IR EXCHANGE BILIARY DRAIN: IMG6046

## 2024-01-25 MED ORDER — IOHEXOL 300 MG/ML  SOLN
50.0000 mL | Freq: Once | INTRAMUSCULAR | Status: AC | PRN
  Administered 2024-01-25: 10 mL

## 2024-01-25 MED ORDER — LIDOCAINE-EPINEPHRINE 1 %-1:100000 IJ SOLN
INTRAMUSCULAR | Status: AC
  Filled 2024-01-25: qty 1

## 2024-01-25 MED ORDER — LIDOCAINE HCL 1 % IJ SOLN
20.0000 mL | Freq: Once | INTRAMUSCULAR | Status: AC
  Administered 2024-01-25: 5 mL

## 2024-01-26 DIAGNOSIS — R110 Nausea: Secondary | ICD-10-CM | POA: Diagnosis not present

## 2024-01-26 DIAGNOSIS — N2581 Secondary hyperparathyroidism of renal origin: Secondary | ICD-10-CM | POA: Diagnosis not present

## 2024-01-26 DIAGNOSIS — N186 End stage renal disease: Secondary | ICD-10-CM | POA: Diagnosis not present

## 2024-01-26 DIAGNOSIS — D631 Anemia in chronic kidney disease: Secondary | ICD-10-CM | POA: Diagnosis not present

## 2024-01-26 DIAGNOSIS — D689 Coagulation defect, unspecified: Secondary | ICD-10-CM | POA: Diagnosis not present

## 2024-01-26 DIAGNOSIS — Z992 Dependence on renal dialysis: Secondary | ICD-10-CM | POA: Diagnosis not present

## 2024-01-26 DIAGNOSIS — D509 Iron deficiency anemia, unspecified: Secondary | ICD-10-CM | POA: Diagnosis not present

## 2024-01-28 ENCOUNTER — Encounter: Payer: Self-pay | Admitting: Cardiology

## 2024-01-28 DIAGNOSIS — N186 End stage renal disease: Secondary | ICD-10-CM | POA: Diagnosis not present

## 2024-01-28 DIAGNOSIS — D689 Coagulation defect, unspecified: Secondary | ICD-10-CM | POA: Diagnosis not present

## 2024-01-28 DIAGNOSIS — N2581 Secondary hyperparathyroidism of renal origin: Secondary | ICD-10-CM | POA: Diagnosis not present

## 2024-01-28 DIAGNOSIS — D631 Anemia in chronic kidney disease: Secondary | ICD-10-CM | POA: Diagnosis not present

## 2024-01-28 DIAGNOSIS — Z992 Dependence on renal dialysis: Secondary | ICD-10-CM | POA: Diagnosis not present

## 2024-01-28 DIAGNOSIS — D509 Iron deficiency anemia, unspecified: Secondary | ICD-10-CM | POA: Diagnosis not present

## 2024-01-28 DIAGNOSIS — R110 Nausea: Secondary | ICD-10-CM | POA: Diagnosis not present

## 2024-01-31 DIAGNOSIS — R110 Nausea: Secondary | ICD-10-CM | POA: Diagnosis not present

## 2024-01-31 DIAGNOSIS — D689 Coagulation defect, unspecified: Secondary | ICD-10-CM | POA: Diagnosis not present

## 2024-01-31 DIAGNOSIS — Z992 Dependence on renal dialysis: Secondary | ICD-10-CM | POA: Diagnosis not present

## 2024-01-31 DIAGNOSIS — N2581 Secondary hyperparathyroidism of renal origin: Secondary | ICD-10-CM | POA: Diagnosis not present

## 2024-01-31 DIAGNOSIS — D509 Iron deficiency anemia, unspecified: Secondary | ICD-10-CM | POA: Diagnosis not present

## 2024-01-31 DIAGNOSIS — D631 Anemia in chronic kidney disease: Secondary | ICD-10-CM | POA: Diagnosis not present

## 2024-01-31 DIAGNOSIS — N186 End stage renal disease: Secondary | ICD-10-CM | POA: Diagnosis not present

## 2024-02-02 DIAGNOSIS — N186 End stage renal disease: Secondary | ICD-10-CM | POA: Diagnosis not present

## 2024-02-02 DIAGNOSIS — N2581 Secondary hyperparathyroidism of renal origin: Secondary | ICD-10-CM | POA: Diagnosis not present

## 2024-02-02 DIAGNOSIS — Z992 Dependence on renal dialysis: Secondary | ICD-10-CM | POA: Diagnosis not present

## 2024-02-02 DIAGNOSIS — R110 Nausea: Secondary | ICD-10-CM | POA: Diagnosis not present

## 2024-02-02 DIAGNOSIS — D509 Iron deficiency anemia, unspecified: Secondary | ICD-10-CM | POA: Diagnosis not present

## 2024-02-02 DIAGNOSIS — D689 Coagulation defect, unspecified: Secondary | ICD-10-CM | POA: Diagnosis not present

## 2024-02-02 DIAGNOSIS — D631 Anemia in chronic kidney disease: Secondary | ICD-10-CM | POA: Diagnosis not present

## 2024-02-04 DIAGNOSIS — N186 End stage renal disease: Secondary | ICD-10-CM | POA: Diagnosis not present

## 2024-02-04 DIAGNOSIS — D689 Coagulation defect, unspecified: Secondary | ICD-10-CM | POA: Diagnosis not present

## 2024-02-04 DIAGNOSIS — D631 Anemia in chronic kidney disease: Secondary | ICD-10-CM | POA: Diagnosis not present

## 2024-02-04 DIAGNOSIS — R110 Nausea: Secondary | ICD-10-CM | POA: Diagnosis not present

## 2024-02-04 DIAGNOSIS — D509 Iron deficiency anemia, unspecified: Secondary | ICD-10-CM | POA: Diagnosis not present

## 2024-02-04 DIAGNOSIS — N2581 Secondary hyperparathyroidism of renal origin: Secondary | ICD-10-CM | POA: Diagnosis not present

## 2024-02-04 DIAGNOSIS — Z992 Dependence on renal dialysis: Secondary | ICD-10-CM | POA: Diagnosis not present

## 2024-02-07 DIAGNOSIS — Z992 Dependence on renal dialysis: Secondary | ICD-10-CM | POA: Diagnosis not present

## 2024-02-07 DIAGNOSIS — D689 Coagulation defect, unspecified: Secondary | ICD-10-CM | POA: Diagnosis not present

## 2024-02-07 DIAGNOSIS — N2581 Secondary hyperparathyroidism of renal origin: Secondary | ICD-10-CM | POA: Diagnosis not present

## 2024-02-07 DIAGNOSIS — R110 Nausea: Secondary | ICD-10-CM | POA: Diagnosis not present

## 2024-02-07 DIAGNOSIS — N186 End stage renal disease: Secondary | ICD-10-CM | POA: Diagnosis not present

## 2024-02-07 DIAGNOSIS — D509 Iron deficiency anemia, unspecified: Secondary | ICD-10-CM | POA: Diagnosis not present

## 2024-02-07 DIAGNOSIS — D631 Anemia in chronic kidney disease: Secondary | ICD-10-CM | POA: Diagnosis not present

## 2024-02-09 DIAGNOSIS — D631 Anemia in chronic kidney disease: Secondary | ICD-10-CM | POA: Diagnosis not present

## 2024-02-09 DIAGNOSIS — N2581 Secondary hyperparathyroidism of renal origin: Secondary | ICD-10-CM | POA: Diagnosis not present

## 2024-02-09 DIAGNOSIS — D509 Iron deficiency anemia, unspecified: Secondary | ICD-10-CM | POA: Diagnosis not present

## 2024-02-09 DIAGNOSIS — D689 Coagulation defect, unspecified: Secondary | ICD-10-CM | POA: Diagnosis not present

## 2024-02-09 DIAGNOSIS — N186 End stage renal disease: Secondary | ICD-10-CM | POA: Diagnosis not present

## 2024-02-09 DIAGNOSIS — R110 Nausea: Secondary | ICD-10-CM | POA: Diagnosis not present

## 2024-02-09 DIAGNOSIS — Z992 Dependence on renal dialysis: Secondary | ICD-10-CM | POA: Diagnosis not present

## 2024-02-10 ENCOUNTER — Other Ambulatory Visit (HOSPITAL_COMMUNITY)

## 2024-02-10 DIAGNOSIS — R1312 Dysphagia, oropharyngeal phase: Secondary | ICD-10-CM | POA: Diagnosis not present

## 2024-02-10 DIAGNOSIS — E7849 Other hyperlipidemia: Secondary | ICD-10-CM | POA: Diagnosis not present

## 2024-02-10 DIAGNOSIS — I222 Subsequent non-ST elevation (NSTEMI) myocardial infarction: Secondary | ICD-10-CM | POA: Diagnosis not present

## 2024-02-10 DIAGNOSIS — N186 End stage renal disease: Secondary | ICD-10-CM | POA: Diagnosis not present

## 2024-02-10 DIAGNOSIS — Z931 Gastrostomy status: Secondary | ICD-10-CM | POA: Diagnosis not present

## 2024-02-11 DIAGNOSIS — D689 Coagulation defect, unspecified: Secondary | ICD-10-CM | POA: Diagnosis not present

## 2024-02-11 DIAGNOSIS — R110 Nausea: Secondary | ICD-10-CM | POA: Diagnosis not present

## 2024-02-11 DIAGNOSIS — D631 Anemia in chronic kidney disease: Secondary | ICD-10-CM | POA: Diagnosis not present

## 2024-02-11 DIAGNOSIS — N2581 Secondary hyperparathyroidism of renal origin: Secondary | ICD-10-CM | POA: Diagnosis not present

## 2024-02-11 DIAGNOSIS — Z992 Dependence on renal dialysis: Secondary | ICD-10-CM | POA: Diagnosis not present

## 2024-02-11 DIAGNOSIS — D509 Iron deficiency anemia, unspecified: Secondary | ICD-10-CM | POA: Diagnosis not present

## 2024-02-11 DIAGNOSIS — N186 End stage renal disease: Secondary | ICD-10-CM | POA: Diagnosis not present

## 2024-02-13 DIAGNOSIS — N186 End stage renal disease: Secondary | ICD-10-CM | POA: Diagnosis not present

## 2024-02-13 DIAGNOSIS — Z9049 Acquired absence of other specified parts of digestive tract: Secondary | ICD-10-CM | POA: Diagnosis not present

## 2024-02-13 DIAGNOSIS — E46 Unspecified protein-calorie malnutrition: Secondary | ICD-10-CM | POA: Diagnosis not present

## 2024-02-13 DIAGNOSIS — M6281 Muscle weakness (generalized): Secondary | ICD-10-CM | POA: Diagnosis not present

## 2024-02-13 DIAGNOSIS — Z431 Encounter for attention to gastrostomy: Secondary | ICD-10-CM | POA: Diagnosis not present

## 2024-02-14 DIAGNOSIS — D631 Anemia in chronic kidney disease: Secondary | ICD-10-CM | POA: Diagnosis not present

## 2024-02-14 DIAGNOSIS — R110 Nausea: Secondary | ICD-10-CM | POA: Diagnosis not present

## 2024-02-14 DIAGNOSIS — Z992 Dependence on renal dialysis: Secondary | ICD-10-CM | POA: Diagnosis not present

## 2024-02-14 DIAGNOSIS — N2581 Secondary hyperparathyroidism of renal origin: Secondary | ICD-10-CM | POA: Diagnosis not present

## 2024-02-14 DIAGNOSIS — D509 Iron deficiency anemia, unspecified: Secondary | ICD-10-CM | POA: Diagnosis not present

## 2024-02-14 DIAGNOSIS — D689 Coagulation defect, unspecified: Secondary | ICD-10-CM | POA: Diagnosis not present

## 2024-02-14 DIAGNOSIS — N186 End stage renal disease: Secondary | ICD-10-CM | POA: Diagnosis not present

## 2024-02-16 DIAGNOSIS — N2581 Secondary hyperparathyroidism of renal origin: Secondary | ICD-10-CM | POA: Diagnosis not present

## 2024-02-16 DIAGNOSIS — R110 Nausea: Secondary | ICD-10-CM | POA: Diagnosis not present

## 2024-02-16 DIAGNOSIS — Z992 Dependence on renal dialysis: Secondary | ICD-10-CM | POA: Diagnosis not present

## 2024-02-16 DIAGNOSIS — D509 Iron deficiency anemia, unspecified: Secondary | ICD-10-CM | POA: Diagnosis not present

## 2024-02-16 DIAGNOSIS — D631 Anemia in chronic kidney disease: Secondary | ICD-10-CM | POA: Diagnosis not present

## 2024-02-16 DIAGNOSIS — D689 Coagulation defect, unspecified: Secondary | ICD-10-CM | POA: Diagnosis not present

## 2024-02-16 DIAGNOSIS — N186 End stage renal disease: Secondary | ICD-10-CM | POA: Diagnosis not present

## 2024-02-17 DIAGNOSIS — E1142 Type 2 diabetes mellitus with diabetic polyneuropathy: Secondary | ICD-10-CM | POA: Diagnosis not present

## 2024-02-17 DIAGNOSIS — M79674 Pain in right toe(s): Secondary | ICD-10-CM | POA: Diagnosis not present

## 2024-02-17 DIAGNOSIS — M79675 Pain in left toe(s): Secondary | ICD-10-CM | POA: Diagnosis not present

## 2024-02-17 DIAGNOSIS — B351 Tinea unguium: Secondary | ICD-10-CM | POA: Diagnosis not present

## 2024-02-17 DIAGNOSIS — Z8673 Personal history of transient ischemic attack (TIA), and cerebral infarction without residual deficits: Secondary | ICD-10-CM | POA: Diagnosis not present

## 2024-02-18 DIAGNOSIS — N186 End stage renal disease: Secondary | ICD-10-CM | POA: Diagnosis not present

## 2024-02-18 DIAGNOSIS — R110 Nausea: Secondary | ICD-10-CM | POA: Diagnosis not present

## 2024-02-18 DIAGNOSIS — D509 Iron deficiency anemia, unspecified: Secondary | ICD-10-CM | POA: Diagnosis not present

## 2024-02-18 DIAGNOSIS — D689 Coagulation defect, unspecified: Secondary | ICD-10-CM | POA: Diagnosis not present

## 2024-02-18 DIAGNOSIS — D631 Anemia in chronic kidney disease: Secondary | ICD-10-CM | POA: Diagnosis not present

## 2024-02-18 DIAGNOSIS — N2581 Secondary hyperparathyroidism of renal origin: Secondary | ICD-10-CM | POA: Diagnosis not present

## 2024-02-18 DIAGNOSIS — Z992 Dependence on renal dialysis: Secondary | ICD-10-CM | POA: Diagnosis not present

## 2024-02-21 DIAGNOSIS — D509 Iron deficiency anemia, unspecified: Secondary | ICD-10-CM | POA: Diagnosis not present

## 2024-02-21 DIAGNOSIS — R110 Nausea: Secondary | ICD-10-CM | POA: Diagnosis not present

## 2024-02-21 DIAGNOSIS — N186 End stage renal disease: Secondary | ICD-10-CM | POA: Diagnosis not present

## 2024-02-21 DIAGNOSIS — D631 Anemia in chronic kidney disease: Secondary | ICD-10-CM | POA: Diagnosis not present

## 2024-02-21 DIAGNOSIS — N2581 Secondary hyperparathyroidism of renal origin: Secondary | ICD-10-CM | POA: Diagnosis not present

## 2024-02-21 DIAGNOSIS — E1122 Type 2 diabetes mellitus with diabetic chronic kidney disease: Secondary | ICD-10-CM | POA: Diagnosis not present

## 2024-02-21 DIAGNOSIS — Z992 Dependence on renal dialysis: Secondary | ICD-10-CM | POA: Diagnosis not present

## 2024-02-21 DIAGNOSIS — D689 Coagulation defect, unspecified: Secondary | ICD-10-CM | POA: Diagnosis not present

## 2024-02-23 DIAGNOSIS — R110 Nausea: Secondary | ICD-10-CM | POA: Diagnosis not present

## 2024-02-23 DIAGNOSIS — Z992 Dependence on renal dialysis: Secondary | ICD-10-CM | POA: Diagnosis not present

## 2024-02-23 DIAGNOSIS — D509 Iron deficiency anemia, unspecified: Secondary | ICD-10-CM | POA: Diagnosis not present

## 2024-02-23 DIAGNOSIS — D689 Coagulation defect, unspecified: Secondary | ICD-10-CM | POA: Diagnosis not present

## 2024-02-23 DIAGNOSIS — D631 Anemia in chronic kidney disease: Secondary | ICD-10-CM | POA: Diagnosis not present

## 2024-02-23 DIAGNOSIS — N186 End stage renal disease: Secondary | ICD-10-CM | POA: Diagnosis not present

## 2024-02-23 DIAGNOSIS — N2581 Secondary hyperparathyroidism of renal origin: Secondary | ICD-10-CM | POA: Diagnosis not present

## 2024-02-25 DIAGNOSIS — D689 Coagulation defect, unspecified: Secondary | ICD-10-CM | POA: Diagnosis not present

## 2024-02-25 DIAGNOSIS — Z992 Dependence on renal dialysis: Secondary | ICD-10-CM | POA: Diagnosis not present

## 2024-02-25 DIAGNOSIS — N186 End stage renal disease: Secondary | ICD-10-CM | POA: Diagnosis not present

## 2024-02-25 DIAGNOSIS — D509 Iron deficiency anemia, unspecified: Secondary | ICD-10-CM | POA: Diagnosis not present

## 2024-02-25 DIAGNOSIS — R110 Nausea: Secondary | ICD-10-CM | POA: Diagnosis not present

## 2024-02-25 DIAGNOSIS — N2581 Secondary hyperparathyroidism of renal origin: Secondary | ICD-10-CM | POA: Diagnosis not present

## 2024-02-25 DIAGNOSIS — D631 Anemia in chronic kidney disease: Secondary | ICD-10-CM | POA: Diagnosis not present

## 2024-02-28 DIAGNOSIS — N2581 Secondary hyperparathyroidism of renal origin: Secondary | ICD-10-CM | POA: Diagnosis not present

## 2024-02-28 DIAGNOSIS — R110 Nausea: Secondary | ICD-10-CM | POA: Diagnosis not present

## 2024-02-28 DIAGNOSIS — Z992 Dependence on renal dialysis: Secondary | ICD-10-CM | POA: Diagnosis not present

## 2024-02-28 DIAGNOSIS — D689 Coagulation defect, unspecified: Secondary | ICD-10-CM | POA: Diagnosis not present

## 2024-02-28 DIAGNOSIS — D631 Anemia in chronic kidney disease: Secondary | ICD-10-CM | POA: Diagnosis not present

## 2024-02-28 DIAGNOSIS — D509 Iron deficiency anemia, unspecified: Secondary | ICD-10-CM | POA: Diagnosis not present

## 2024-02-28 DIAGNOSIS — N186 End stage renal disease: Secondary | ICD-10-CM | POA: Diagnosis not present

## 2024-03-01 DIAGNOSIS — N2581 Secondary hyperparathyroidism of renal origin: Secondary | ICD-10-CM | POA: Diagnosis not present

## 2024-03-01 DIAGNOSIS — R110 Nausea: Secondary | ICD-10-CM | POA: Diagnosis not present

## 2024-03-01 DIAGNOSIS — D509 Iron deficiency anemia, unspecified: Secondary | ICD-10-CM | POA: Diagnosis not present

## 2024-03-01 DIAGNOSIS — Z992 Dependence on renal dialysis: Secondary | ICD-10-CM | POA: Diagnosis not present

## 2024-03-01 DIAGNOSIS — N186 End stage renal disease: Secondary | ICD-10-CM | POA: Diagnosis not present

## 2024-03-01 DIAGNOSIS — D631 Anemia in chronic kidney disease: Secondary | ICD-10-CM | POA: Diagnosis not present

## 2024-03-01 DIAGNOSIS — D689 Coagulation defect, unspecified: Secondary | ICD-10-CM | POA: Diagnosis not present

## 2024-03-03 DIAGNOSIS — N2581 Secondary hyperparathyroidism of renal origin: Secondary | ICD-10-CM | POA: Diagnosis not present

## 2024-03-03 DIAGNOSIS — D509 Iron deficiency anemia, unspecified: Secondary | ICD-10-CM | POA: Diagnosis not present

## 2024-03-03 DIAGNOSIS — N186 End stage renal disease: Secondary | ICD-10-CM | POA: Diagnosis not present

## 2024-03-03 DIAGNOSIS — Z992 Dependence on renal dialysis: Secondary | ICD-10-CM | POA: Diagnosis not present

## 2024-03-03 DIAGNOSIS — D631 Anemia in chronic kidney disease: Secondary | ICD-10-CM | POA: Diagnosis not present

## 2024-03-03 DIAGNOSIS — R110 Nausea: Secondary | ICD-10-CM | POA: Diagnosis not present

## 2024-03-03 DIAGNOSIS — D689 Coagulation defect, unspecified: Secondary | ICD-10-CM | POA: Diagnosis not present

## 2024-03-05 ENCOUNTER — Encounter (HOSPITAL_COMMUNITY): Payer: Self-pay

## 2024-03-05 ENCOUNTER — Emergency Department (HOSPITAL_COMMUNITY): Admission: EM | Admit: 2024-03-05 | Discharge: 2024-03-06 | Disposition: A | Discharge status: Home / Self Care

## 2024-03-05 ENCOUNTER — Emergency Department (HOSPITAL_COMMUNITY)

## 2024-03-05 ENCOUNTER — Other Ambulatory Visit: Payer: Self-pay

## 2024-03-05 DIAGNOSIS — R7309 Other abnormal glucose: Secondary | ICD-10-CM | POA: Insufficient documentation

## 2024-03-05 DIAGNOSIS — K8020 Calculus of gallbladder without cholecystitis without obstruction: Secondary | ICD-10-CM | POA: Diagnosis not present

## 2024-03-05 DIAGNOSIS — R059 Cough, unspecified: Secondary | ICD-10-CM | POA: Diagnosis not present

## 2024-03-05 DIAGNOSIS — Z431 Encounter for attention to gastrostomy: Secondary | ICD-10-CM | POA: Insufficient documentation

## 2024-03-05 DIAGNOSIS — R109 Unspecified abdominal pain: Secondary | ICD-10-CM | POA: Diagnosis not present

## 2024-03-05 DIAGNOSIS — Z992 Dependence on renal dialysis: Secondary | ICD-10-CM | POA: Diagnosis not present

## 2024-03-05 DIAGNOSIS — N186 End stage renal disease: Secondary | ICD-10-CM | POA: Insufficient documentation

## 2024-03-05 DIAGNOSIS — F0390 Unspecified dementia without behavioral disturbance: Secondary | ICD-10-CM | POA: Insufficient documentation

## 2024-03-05 DIAGNOSIS — Z7901 Long term (current) use of anticoagulants: Secondary | ICD-10-CM | POA: Insufficient documentation

## 2024-03-05 DIAGNOSIS — R1013 Epigastric pain: Secondary | ICD-10-CM | POA: Diagnosis not present

## 2024-03-05 DIAGNOSIS — I700 Atherosclerosis of aorta: Secondary | ICD-10-CM | POA: Diagnosis not present

## 2024-03-05 DIAGNOSIS — I517 Cardiomegaly: Secondary | ICD-10-CM | POA: Diagnosis not present

## 2024-03-05 DIAGNOSIS — K5730 Diverticulosis of large intestine without perforation or abscess without bleeding: Secondary | ICD-10-CM | POA: Diagnosis not present

## 2024-03-05 DIAGNOSIS — R9389 Abnormal findings on diagnostic imaging of other specified body structures: Secondary | ICD-10-CM | POA: Diagnosis not present

## 2024-03-05 LAB — COMPREHENSIVE METABOLIC PANEL WITH GFR
ALT: 15 U/L (ref 0–44)
AST: 25 U/L (ref 15–41)
Albumin: 3.5 g/dL (ref 3.5–5.0)
Alkaline Phosphatase: 232 U/L — ABNORMAL HIGH (ref 38–126)
Anion gap: 13 (ref 5–15)
BUN: 28 mg/dL — ABNORMAL HIGH (ref 8–23)
CO2: 26 mmol/L (ref 22–32)
Calcium: 9.3 mg/dL (ref 8.9–10.3)
Chloride: 93 mmol/L — ABNORMAL LOW (ref 98–111)
Creatinine, Ser: 3.25 mg/dL — ABNORMAL HIGH (ref 0.44–1.00)
GFR, Estimated: 14 mL/min — ABNORMAL LOW (ref >60)
Glucose, Bld: 94 mg/dL (ref 70–99)
Potassium: 5.2 mmol/L — ABNORMAL HIGH (ref 3.5–5.1)
Sodium: 132 mmol/L — ABNORMAL LOW (ref 135–145)
Total Bilirubin: 0.9 mg/dL (ref 0.0–1.2)
Total Protein: 6.7 g/dL (ref 6.5–8.1)

## 2024-03-05 LAB — CBC WITH DIFFERENTIAL/PLATELET
Abs Immature Granulocytes: 0.03 K/uL (ref 0.00–0.07)
Basophils Absolute: 0 K/uL (ref 0.0–0.1)
Basophils Relative: 1 %
Eosinophils Absolute: 0.2 K/uL (ref 0.0–0.5)
Eosinophils Relative: 3 %
HCT: 33.8 % — ABNORMAL LOW (ref 36.0–46.0)
Hemoglobin: 10.9 g/dL — ABNORMAL LOW (ref 12.0–15.0)
Immature Granulocytes: 0 %
Lymphocytes Relative: 33 %
Lymphs Abs: 2.3 K/uL (ref 0.7–4.0)
MCH: 30.1 pg (ref 26.0–34.0)
MCHC: 32.2 g/dL (ref 30.0–36.0)
MCV: 93.4 fL (ref 80.0–100.0)
Monocytes Absolute: 0.8 K/uL (ref 0.1–1.0)
Monocytes Relative: 11 %
Neutro Abs: 3.5 K/uL (ref 1.7–7.7)
Neutrophils Relative %: 52 %
Platelets: 140 K/uL — ABNORMAL LOW (ref 150–400)
RBC: 3.62 MIL/uL — ABNORMAL LOW (ref 3.87–5.11)
RDW: 15.4 % (ref 11.5–15.5)
WBC: 6.9 K/uL (ref 4.0–10.5)
nRBC: 0 % (ref 0.0–0.2)

## 2024-03-05 LAB — CBG MONITORING, ED
Glucose-Capillary: 95 mg/dL (ref 70–99)
Glucose-Capillary: 80 mg/dL (ref 70–99)

## 2024-03-05 LAB — I-STAT CG4 LACTIC ACID, ED
Lactic Acid, Venous: 2.1 mmol/L (ref 0.5–1.9)
Lactic Acid, Venous: 2 mmol/L (ref 0.5–1.9)

## 2024-03-05 NOTE — ED Notes (Signed)
 Samantha [name] Daughter Emergency Contact [phone]

## 2024-03-05 NOTE — ED Triage Notes (Signed)
 Pt has peg tube, has had abd pain for 2 hours. Daughter called EMS. C/O epigastric pain. Last dialysis tx was Friday.Alert per normal. Hx of dementia.

## 2024-03-05 NOTE — Discharge Instructions (Signed)
 Your workup today was reassuring.  There were no findings on your x-ray or CT scan.  Please follow-up tomorrow for dialysis as scheduled.  Return to the emergency department for worsening symptoms.

## 2024-03-05 NOTE — ED Notes (Signed)
 BC AT LAB

## 2024-03-05 NOTE — ED Provider Notes (Signed)
 [name] EMERGENCY DEPARTMENT AT [facility] Provider Note   CSN: [id number] Arrival date & time: 03/05/24  [id number]     Patient presents with: Abdominal Pain   Samantha [name] is a 78 y.o. female.   78 year old female with past medical history of end-stage renal disease and dementia presenting to the emergency department today complaining of abdominal pain.  The patient apparently started complaining of abdominal pain 2 hours prior to arrival.  She is complaining of pain mostly in the epigastric region.  She does have a PEG tube.  Her daughter brought her in for further evaluation.  She has been afebrile.  Had a normal bowel movement yesterday.  She has not had any vomiting.  She does have a percutaneous cholecystostomy drain.  This has been draining normal per the patient's daughter.  She last had dialysis on Friday as scheduled.   Abdominal Pain      Prior to Admission medications   Medication Sig Start Date End Date Taking? Authorizing Provider  acetaminophen  (TYLENOL ) 500 MG tablet Take 500 mg by mouth daily as needed for mild pain (pain score 1-3).   Yes [provider]  apixaban  (ELIQUIS ) 2.5 MG TABS tablet Take 1 tablet (2.5 mg total) by mouth 2 (two) times daily. 11/24/23  Yes Sherlynn Madden, MD  atorvastatin  (LIPITOR) 10 MG tablet Take 1 tablet (10 mg total) by mouth daily. Patient taking differently: Take 10 mg by mouth at bedtime. 10/19/23  Yes Sherlynn Madden, MD  carvedilol  (COREG ) 3.125 MG tablet Take 3.125 mg Q AM  and 3.125 mg Q PM on non dialysis days (Tues, Thurs, Sat and Sun,  - On dialysis days,  MWF, continue Coreg  3.125 mg Q PM -  Hold for SBP < 100 Patient taking differently: Take 3.125 mg by mouth See admin instructions. Take 3.125 mg by mouth twice daily on non dialysis days. Take 3.125 mg by mouth only in the evening on dialysis days. 11/24/23  Yes Sherlynn Madden, MD  Glucagon (BAQSIMI ONE PACK) 3 MG/DOSE POWD Place 1 packet  into the nose as needed (For blood sugar). Use when blood sugar drops below 70.   Yes [provider]  insulin  aspart (NOVOLOG ) 100 UNIT/ML injection 0-9 Units, Subcutaneous, 3 times daily with meals, CBG < 70: Implement Hypoglycemia measures CBG 70 - 120: 0 units CBG 121 - 150: 1 unit CBG 151 - 200: 2 units CBG 201 - 250: 3 units CBG 251 - 300: 5 units CBG 301 - 350: 7 units CBG 351 - 400: 9 units CBG > 400: call MD Patient taking differently: Inject 0-10 Units into the skin in the morning, at noon, and at bedtime. BS <70  inject 0 units  BS 70-90 inject 1 unit  BS 91-130 inject 2 units  BS 131-150 inject 3 units  BS 151-200 inject 4 units  BS 201-250 inject 5 units  BS 251-300 inject 6 units  BS 301-350 inject 7 units  BS 351-400 inject 8 units  BS 401-450 inject 9 units  BS > 450 inject 10 units 12/25/22  Yes Ghimire, Donalda HERO, MD  insulin  glargine (LANTUS ) 100 UNIT/ML Solostar Pen Inject 15 Units into the skin at bedtime.   Yes [provider]  Multiple Vitamins-Minerals (MULTIVITAMIN WITH MINERALS) tablet Take 1 tablet by mouth at bedtime.   Yes [provider]  Nutritional Supplements (FEEDING SUPPLEMENT, OSMOLITE 1.5 CAL,) LIQD Place 600 mLs into feeding tube See admin instructions. 8p-6a   Yes [provider]  polyethylene glycol powder (MIRALAX ) 17 GM/SCOOP powder Take 17 g by mouth 2 (two) times daily as needed for moderate constipation or mild constipation. 12/09/23  Yes Gonfa, Taye T, MD  promethazine  (PHENERGAN ) 25 MG tablet Take 0.5 tablets (12.5 mg total) by mouth every 6 (six) hours as needed for nausea or vomiting. 09/01/23  Yes Ula Prentice SAUNDERS, MD  senna (SENOKOT) 8.6 MG TABS tablet Take 1 tablet by mouth at bedtime.   Yes [provider]  senna-docusate (SENOKOT-S) 8.6-50 MG tablet Take 1-2 tablets by mouth 2 (two) times daily between meals as needed for mild constipation. Patient not taking: Reported on 03/05/2024 12/09/23   Gonfa, Taye T,  MD    Allergies: Asa [aspirin] and Penicillins    Review of Systems  Reason unable to perform ROS: Advanced dementia.  Gastrointestinal:  Positive for abdominal pain.  All other systems reviewed and are negative.   Updated Vital Signs BP (!) 158/80   Pulse 74   Temp 98.1 F (36.7 C) (Oral)   Resp 15   Ht 4' 11 (1.499 m)   Wt 58.1 kg   SpO2 100%   BMI 25.85 kg/m   Physical Exam Vitals and nursing note reviewed.   Gen: NAD Eyes: PERRL, EOMI HEENT: no oropharyngeal swelling Neck: trachea midline Resp: clear to auscultation bilaterally Card: RRR, no murmurs, rubs, or gallops Abd: Cholecystostomy tube and PEG tube in appropriate position, mild epigastric tenderness noted Extremities: no calf tenderness, no edema Vascular: 2+ radial pulses bilaterally, 2+ DP pulses bilaterally Skin: no rashes Psyc: acting appropriately   (all labs ordered are listed, but only abnormal results are displayed) Labs Reviewed  COMPREHENSIVE METABOLIC PANEL WITH GFR - Abnormal; Notable for the following components:      Result Value   Sodium 132 (*)    Potassium 5.2 (*)    Chloride 93 (*)    BUN 28 (*)    Creatinine, Ser 3.25 (*)    Alkaline Phosphatase 232 (*)    GFR, Estimated 14 (*)    All other components within normal limits  CBC WITH DIFFERENTIAL/PLATELET - Abnormal; Notable for the following components:   RBC 3.62 (*)    Hemoglobin 10.9 (*)    HCT 33.8 (*)    Platelets 140 (*)    All other components within normal limits  I-STAT CG4 LACTIC ACID, ED - Abnormal; Notable for the following components:   Lactic Acid, Venous 2.1 (*)    All other components within normal limits  I-STAT CG4 LACTIC ACID, ED - Abnormal; Notable for the following components:   Lactic Acid, Venous 2.0 (*)    All other components within normal limits  CBG MONITORING, ED  CBG MONITORING, ED    EKG: EKG Interpretation Date/Time:  Sunday March 05 2024 20:49:00 EDT Ventricular Rate:  76 PR  Interval:  167 QRS Duration:  92 QT Interval:  425 QTC Calculation: 478 R Axis:   -66  Text Interpretation: Sinus rhythm Left anterior fascicular block Low voltage, precordial leads Consider anterior infarct Nonspecific T abnormalities, lateral leads Confirmed by Ula Prentice [phone]) on 03/05/2024 9:20:30 PM  Radiology: CT ABDOMEN PELVIS WO CONTRAST Result Date: 03/05/2024 CLINICAL DATA:  Abdominal pain, acute, nonlocalized EXAM: CT ABDOMEN AND PELVIS WITHOUT CONTRAST TECHNIQUE: Multidetector CT imaging of the abdomen and pelvis was performed following the standard protocol without IV contrast. RADIATION DOSE REDUCTION: This exam was performed according to the departmental dose-optimization program which includes automated exposure control, adjustment of the  mA and/or kV according to patient size and/or use of iterative reconstruction technique. COMPARISON:  CT abdomen pelvis 12/06/2023 FINDINGS: Lower chest: No acute abnormality. Hepatobiliary: Cholecystostomy tube in the gallbladder. Unchanged cholelithiasis and mild wall thickening. No biliary dilation. No acute abnormality in the liver. Pancreas: Unremarkable. Spleen: Unremarkable. Adrenals/Urinary Tract: Normal adrenal glands. No urinary calculi or hydronephrosis. Stomach/Bowel: Moderate stool in the rectum. Question rectal wall thickening and adjacent fluid similar to prior though this is less conspicuous without IV contrast. Colonic diverticulosis without diverticulitis. Normal appendix. No bowel obstruction. Percutaneous gastrostomy tube in the stomach. Vascular/Lymphatic: Aortic atherosclerotic calcification. IVC filter. No lymphadenopathy. Reproductive: Hysterectomy.  No adnexal mass. Other: No free intraperitoneal fluid or air. Musculoskeletal: No acute fracture. IMPRESSION: 1. Moderate stool in the rectum. Question rectal wall thickening and adjacent fluid similar to prior though this is less conspicuous without IV contrast. 2. Cholecystostomy  tube in the gallbladder. Unchanged cholelithiasis and mild wall thickening. 3. Aortic Atherosclerosis (ICD10-I70.0). Electronically Signed   By: Norman Gatlin M.D.   On: 03/05/2024 23:38   DG Chest Portable 1 View Result Date: 03/05/2024 CLINICAL DATA:  Cough EXAM: PORTABLE CHEST 1 VIEW COMPARISON:  12/06/2023 FINDINGS: Unchanged cardiomegaly. Aortic atherosclerotic calcification. Dialysis catheter tip in the right atrium. Linear airspace opacity in the right suprahilar region likely due to atelectasis. Bibasilar atelectasis. No pleural effusion or pneumothorax. Elevated right hemidiaphragm. IMPRESSION: No active disease. Electronically Signed   By: Norman Gatlin M.D.   On: 03/05/2024 21:46     Procedures   Medications Ordered in the ED - No data to display                                  Medical Decision Making 78 year old female with past medical history of advanced dementia and end-stage renal disease presenting to the emergency department today with abdominal pain.  Will further evaluate the patient here with an EKG to screen for cardiac etiology as well as LFTs and a lipase.  Will obtain a chest x-ray and CT scan of her abdomen for further evaluation.  If this unremarkable I think the patient may be discharged safely as her abdominal exam is relatively benign.  The patient's work appears reassuring.  She is discharged with return precautions.  Amount and/or Complexity of Data Reviewed Labs: ordered. Radiology: ordered.        Final diagnoses:  Abdominal pain, unspecified abdominal location    ED Discharge Orders     None          Ula Prentice SAUNDERS, MD 03/05/24 [phone]

## 2024-03-06 DIAGNOSIS — Z743 Need for continuous supervision: Secondary | ICD-10-CM | POA: Diagnosis not present

## 2024-03-06 DIAGNOSIS — D689 Coagulation defect, unspecified: Secondary | ICD-10-CM | POA: Diagnosis not present

## 2024-03-06 DIAGNOSIS — R109 Unspecified abdominal pain: Secondary | ICD-10-CM | POA: Diagnosis not present

## 2024-03-06 DIAGNOSIS — Z992 Dependence on renal dialysis: Secondary | ICD-10-CM | POA: Diagnosis not present

## 2024-03-06 DIAGNOSIS — R110 Nausea: Secondary | ICD-10-CM | POA: Diagnosis not present

## 2024-03-06 DIAGNOSIS — N186 End stage renal disease: Secondary | ICD-10-CM | POA: Diagnosis not present

## 2024-03-06 DIAGNOSIS — D509 Iron deficiency anemia, unspecified: Secondary | ICD-10-CM | POA: Diagnosis not present

## 2024-03-06 DIAGNOSIS — N2581 Secondary hyperparathyroidism of renal origin: Secondary | ICD-10-CM | POA: Diagnosis not present

## 2024-03-06 DIAGNOSIS — Z7401 Bed confinement status: Secondary | ICD-10-CM | POA: Diagnosis not present

## 2024-03-06 DIAGNOSIS — D631 Anemia in chronic kidney disease: Secondary | ICD-10-CM | POA: Diagnosis not present

## 2024-03-06 LAB — CBG MONITORING, ED: Glucose-Capillary: 81 mg/dL (ref 70–99)

## 2024-03-08 ENCOUNTER — Telehealth: Payer: Self-pay | Admitting: Cardiology

## 2024-03-08 DIAGNOSIS — D631 Anemia in chronic kidney disease: Secondary | ICD-10-CM | POA: Diagnosis not present

## 2024-03-08 DIAGNOSIS — N2581 Secondary hyperparathyroidism of renal origin: Secondary | ICD-10-CM | POA: Diagnosis not present

## 2024-03-08 DIAGNOSIS — D689 Coagulation defect, unspecified: Secondary | ICD-10-CM | POA: Diagnosis not present

## 2024-03-08 DIAGNOSIS — D509 Iron deficiency anemia, unspecified: Secondary | ICD-10-CM | POA: Diagnosis not present

## 2024-03-08 DIAGNOSIS — N186 End stage renal disease: Secondary | ICD-10-CM | POA: Diagnosis not present

## 2024-03-08 DIAGNOSIS — Z992 Dependence on renal dialysis: Secondary | ICD-10-CM | POA: Diagnosis not present

## 2024-03-08 DIAGNOSIS — R110 Nausea: Secondary | ICD-10-CM | POA: Diagnosis not present

## 2024-03-08 NOTE — Telephone Encounter (Signed)
 STAT if HR is under 50 or over 120 (normal HR is 60-100 beats per minute)  What is your heart rate? 107, this morning it was 102- patient's daughter is concerned about patient heart rate. She said she was just in the ER on Sunday(03-05-24)Do you have a log of your heart rate readings (document readings)?   Do you have any other symptoms? No symptoms, just high heart rate

## 2024-03-08 NOTE — Telephone Encounter (Signed)
 Spoke with pt's daughter per [facility] regarding pt's heart rate. Pt's heart rate has been elevated since waking up this morning. Pt had dialysis this morning and the nurse reported her heart rate as 107. Pt heart rate has been between 100 and 107 today. Pt's blood pressures were 152/86 and 148/87 yesterday. Daughter stated these readings are typical for the pt. Daughter was asked about the pt's fluid intake. Daughter stated that the pt has a peg tube and does not drink much. Daughter was encouraged to have the pt drink more fluids. Daughter was told that the information she provided would be sent to Dr. Lavona for review. Daughter verbalized understanding. All questions if any were answered.

## 2024-03-09 NOTE — Telephone Encounter (Signed)
 Spoke with pt's daughter (per dpr) reagrding Dr. Denver suggestions. Daughter stated the pt's pulse and blood pressure have been good after dialysis yesterday. Daughter was told that if the patient begins to have sob or chest pain she would need to go to the ED. Daughter verbalized understanding. All questions if any were answered.

## 2024-03-10 ENCOUNTER — Telehealth: Payer: Self-pay

## 2024-03-10 DIAGNOSIS — D631 Anemia in chronic kidney disease: Secondary | ICD-10-CM | POA: Diagnosis not present

## 2024-03-10 DIAGNOSIS — Z992 Dependence on renal dialysis: Secondary | ICD-10-CM | POA: Diagnosis not present

## 2024-03-10 DIAGNOSIS — D689 Coagulation defect, unspecified: Secondary | ICD-10-CM | POA: Diagnosis not present

## 2024-03-10 DIAGNOSIS — F0280 Dementia in other diseases classified elsewhere without behavioral disturbance: Secondary | ICD-10-CM

## 2024-03-10 DIAGNOSIS — N2581 Secondary hyperparathyroidism of renal origin: Secondary | ICD-10-CM | POA: Diagnosis not present

## 2024-03-10 DIAGNOSIS — N186 End stage renal disease: Secondary | ICD-10-CM | POA: Diagnosis not present

## 2024-03-10 DIAGNOSIS — D509 Iron deficiency anemia, unspecified: Secondary | ICD-10-CM | POA: Diagnosis not present

## 2024-03-10 DIAGNOSIS — R32 Unspecified urinary incontinence: Secondary | ICD-10-CM

## 2024-03-10 DIAGNOSIS — L8990 Pressure ulcer of unspecified site, unspecified stage: Secondary | ICD-10-CM

## 2024-03-10 DIAGNOSIS — R627 Adult failure to thrive: Secondary | ICD-10-CM

## 2024-03-10 DIAGNOSIS — R110 Nausea: Secondary | ICD-10-CM | POA: Diagnosis not present

## 2024-03-10 NOTE — Telephone Encounter (Signed)
 Spoke with patients daughter Samantha [name], patient with peg tube pain and smell from tube, patient is needing a referral for pending appointment (03/21/24) per patients daughter.  Outgoing call placed to [facility] with Interventional radiology at the number provided [phone], no answer, and per recording I could call the main number [phone] for faster assistance.   Outgoing call placed to main number and I left a detailed message asking that someone return call to advise if a new referral is needed or not  Awaiting return call

## 2024-03-10 NOTE — Telephone Encounter (Signed)
 Copied from CRM [id number]. Topic: Referral - Request for Referral >> Mar 10, 2024  9:55 AM Diannia H wrote: Did the patient discuss referral with their provider in the last year? Yes (If No - schedule appointment) (If Yes - send message)  Appointment offered? Yes  Type of order/referral and detailed reason for visit: Intervention Radiology  Preference of office, provider, location: [facility] [phone]  If referral order, have you been seen by this specialty before? No (If Yes, this issue or another issue? When? Where?  Can we respond through MyChart? Yes  The patients daughter Delon is not sure if she needs this but just wants to be sure, could you assist, callback number is [phone].

## 2024-03-10 NOTE — Telephone Encounter (Signed)
 Copied from CRM [id number]. Topic: Clinical - Order For Equipment >> Mar 10, 2024  1:25 PM Alfonso ORN wrote: Reason for CRM:  the prescription was suppose monthly supplies for 11 refill to be for adult diaper vs pullups and the pullups was sent t in error was ship by [employer] [phone] , requesting need a new prescription for adult  diapers size 2 XL .  when the authorization is done for the new prescription please email to  patient case manager : Lenny Molt : phone : [phone] , email address: [email or web address]  Please call daughter on status and if have further questions  patient (daughter jocelyn Vessey [phone]

## 2024-03-10 NOTE — Telephone Encounter (Signed)
 Spoke with Laniya and confirmed a fax number as [phone], as we do not email patient information (HIPAA violation).  Order sent electronically: Refer to chart review- Misc Reports- Chart review:Routing History for documentation

## 2024-03-12 DIAGNOSIS — N186 End stage renal disease: Secondary | ICD-10-CM | POA: Diagnosis not present

## 2024-03-13 DIAGNOSIS — Z992 Dependence on renal dialysis: Secondary | ICD-10-CM | POA: Diagnosis not present

## 2024-03-13 DIAGNOSIS — N186 End stage renal disease: Secondary | ICD-10-CM | POA: Diagnosis not present

## 2024-03-13 DIAGNOSIS — D689 Coagulation defect, unspecified: Secondary | ICD-10-CM | POA: Diagnosis not present

## 2024-03-13 DIAGNOSIS — N2581 Secondary hyperparathyroidism of renal origin: Secondary | ICD-10-CM | POA: Diagnosis not present

## 2024-03-13 DIAGNOSIS — D509 Iron deficiency anemia, unspecified: Secondary | ICD-10-CM | POA: Diagnosis not present

## 2024-03-13 DIAGNOSIS — D631 Anemia in chronic kidney disease: Secondary | ICD-10-CM | POA: Diagnosis not present

## 2024-03-13 DIAGNOSIS — R110 Nausea: Secondary | ICD-10-CM | POA: Diagnosis not present

## 2024-03-14 DIAGNOSIS — Z9049 Acquired absence of other specified parts of digestive tract: Secondary | ICD-10-CM | POA: Diagnosis not present

## 2024-03-14 DIAGNOSIS — Z431 Encounter for attention to gastrostomy: Secondary | ICD-10-CM | POA: Diagnosis not present

## 2024-03-14 DIAGNOSIS — M6281 Muscle weakness (generalized): Secondary | ICD-10-CM | POA: Diagnosis not present

## 2024-03-14 DIAGNOSIS — N186 End stage renal disease: Secondary | ICD-10-CM | POA: Diagnosis not present

## 2024-03-14 DIAGNOSIS — E46 Unspecified protein-calorie malnutrition: Secondary | ICD-10-CM | POA: Diagnosis not present

## 2024-03-15 DIAGNOSIS — R110 Nausea: Secondary | ICD-10-CM | POA: Diagnosis not present

## 2024-03-15 DIAGNOSIS — D689 Coagulation defect, unspecified: Secondary | ICD-10-CM | POA: Diagnosis not present

## 2024-03-15 DIAGNOSIS — R1312 Dysphagia, oropharyngeal phase: Secondary | ICD-10-CM | POA: Diagnosis not present

## 2024-03-15 DIAGNOSIS — I222 Subsequent non-ST elevation (NSTEMI) myocardial infarction: Secondary | ICD-10-CM | POA: Diagnosis not present

## 2024-03-15 DIAGNOSIS — N2581 Secondary hyperparathyroidism of renal origin: Secondary | ICD-10-CM | POA: Diagnosis not present

## 2024-03-15 DIAGNOSIS — E7849 Other hyperlipidemia: Secondary | ICD-10-CM | POA: Diagnosis not present

## 2024-03-15 DIAGNOSIS — Z992 Dependence on renal dialysis: Secondary | ICD-10-CM | POA: Diagnosis not present

## 2024-03-15 DIAGNOSIS — Z931 Gastrostomy status: Secondary | ICD-10-CM | POA: Diagnosis not present

## 2024-03-15 DIAGNOSIS — N186 End stage renal disease: Secondary | ICD-10-CM | POA: Diagnosis not present

## 2024-03-15 DIAGNOSIS — D631 Anemia in chronic kidney disease: Secondary | ICD-10-CM | POA: Diagnosis not present

## 2024-03-15 DIAGNOSIS — D509 Iron deficiency anemia, unspecified: Secondary | ICD-10-CM | POA: Diagnosis not present

## 2024-03-16 ENCOUNTER — Ambulatory Visit: Payer: Self-pay

## 2024-03-16 NOTE — Telephone Encounter (Signed)
 FYI Only or Action Required?: FYI only for provider.  Patient was last seen in primary care on 01/18/2024 by Sherlynn Madden, MD.  Called Nurse Triage reporting No chief complaint on file..  Symptoms began today.  Interventions attempted: Nothing.  Symptoms are: stable.  Triage Disposition: Home Care  Patient/caregiver understands and will follow disposition?: YesCopied from CRM [id number]. Topic: Clinical - Red Word Triage >> Mar 16, 2024  9:03 AM Zane F wrote: [facility] that prompted transfer to Nurse Triage:   Oxygen level at 92-93 (Normal range is 98-100)  A week ago the patient was in the hospital for chest pains Reason for Disposition  Nursing judgment or information in reference  Answer Assessment - Initial Assessment Questions 1. REASON FOR CALL: What is your main concern right now? Oxygen levels ranged from 92-93% this morning. BP: 137/81; HR: 74; BS 220 and 3 units were given; temp: 97.9.  Daughter called with concerns of lower than normal oxygen level. She stated pt is showing no signs of distress. RN advised to keep monitoring and if she becomes symptomatic or level drops to 90 or below to go to ED.  Protocols used: No Guideline Available-A-AH

## 2024-03-17 DIAGNOSIS — N2581 Secondary hyperparathyroidism of renal origin: Secondary | ICD-10-CM | POA: Diagnosis not present

## 2024-03-17 DIAGNOSIS — N186 End stage renal disease: Secondary | ICD-10-CM | POA: Diagnosis not present

## 2024-03-17 DIAGNOSIS — D689 Coagulation defect, unspecified: Secondary | ICD-10-CM | POA: Diagnosis not present

## 2024-03-17 DIAGNOSIS — D509 Iron deficiency anemia, unspecified: Secondary | ICD-10-CM | POA: Diagnosis not present

## 2024-03-17 DIAGNOSIS — Z992 Dependence on renal dialysis: Secondary | ICD-10-CM | POA: Diagnosis not present

## 2024-03-17 DIAGNOSIS — D631 Anemia in chronic kidney disease: Secondary | ICD-10-CM | POA: Diagnosis not present

## 2024-03-17 DIAGNOSIS — R110 Nausea: Secondary | ICD-10-CM | POA: Diagnosis not present

## 2024-03-20 ENCOUNTER — Other Ambulatory Visit: Payer: Self-pay | Admitting: Sports Medicine

## 2024-03-20 DIAGNOSIS — N2581 Secondary hyperparathyroidism of renal origin: Secondary | ICD-10-CM | POA: Diagnosis not present

## 2024-03-20 DIAGNOSIS — D509 Iron deficiency anemia, unspecified: Secondary | ICD-10-CM | POA: Diagnosis not present

## 2024-03-20 DIAGNOSIS — Z992 Dependence on renal dialysis: Secondary | ICD-10-CM | POA: Diagnosis not present

## 2024-03-20 DIAGNOSIS — I6990 Unspecified sequelae of unspecified cerebrovascular disease: Secondary | ICD-10-CM

## 2024-03-20 DIAGNOSIS — D689 Coagulation defect, unspecified: Secondary | ICD-10-CM | POA: Diagnosis not present

## 2024-03-20 DIAGNOSIS — E1122 Type 2 diabetes mellitus with diabetic chronic kidney disease: Secondary | ICD-10-CM

## 2024-03-20 DIAGNOSIS — R110 Nausea: Secondary | ICD-10-CM | POA: Diagnosis not present

## 2024-03-20 DIAGNOSIS — N186 End stage renal disease: Secondary | ICD-10-CM | POA: Diagnosis not present

## 2024-03-20 DIAGNOSIS — D631 Anemia in chronic kidney disease: Secondary | ICD-10-CM | POA: Diagnosis not present

## 2024-03-21 ENCOUNTER — Ambulatory Visit (HOSPITAL_COMMUNITY)
Admission: RE | Admit: 2024-03-21 | Discharge: 2024-03-21 | Disposition: A | Discharge status: Home / Self Care | Source: Ambulatory Visit | Attending: Diagnostic Radiology | Admitting: Diagnostic Radiology

## 2024-03-21 ENCOUNTER — Other Ambulatory Visit (HOSPITAL_COMMUNITY): Payer: Self-pay | Admitting: Interventional Radiology

## 2024-03-21 DIAGNOSIS — Z431 Encounter for attention to gastrostomy: Secondary | ICD-10-CM | POA: Diagnosis not present

## 2024-03-21 DIAGNOSIS — E46 Unspecified protein-calorie malnutrition: Secondary | ICD-10-CM | POA: Diagnosis not present

## 2024-03-21 DIAGNOSIS — Z931 Gastrostomy status: Secondary | ICD-10-CM

## 2024-03-21 DIAGNOSIS — Z434 Encounter for attention to other artificial openings of digestive tract: Secondary | ICD-10-CM | POA: Insufficient documentation

## 2024-03-21 DIAGNOSIS — R1032 Left lower quadrant pain: Secondary | ICD-10-CM | POA: Diagnosis not present

## 2024-03-21 DIAGNOSIS — R1310 Dysphagia, unspecified: Secondary | ICD-10-CM | POA: Diagnosis not present

## 2024-03-21 HISTORY — PX: IR EXCHANGE BILIARY DRAIN: IMG6046

## 2024-03-21 HISTORY — PX: IR REPLC GASTRO/COLONIC TUBE PERCUT W/FLUORO: IMG2333

## 2024-03-21 MED ORDER — LIDOCAINE HCL 1 % IJ SOLN
INTRAMUSCULAR | Status: AC
  Filled 2024-03-21: qty 20

## 2024-03-21 MED ORDER — IOHEXOL 300 MG/ML  SOLN
50.0000 mL | Freq: Once | INTRAMUSCULAR | Status: AC | PRN
  Administered 2024-03-21: 20 mL

## 2024-03-21 MED ORDER — LIDOCAINE VISCOUS HCL 2 % MT SOLN
OROMUCOSAL | Status: AC
  Filled 2024-03-21: qty 15

## 2024-03-21 MED ORDER — LIDOCAINE HCL 1 % IJ SOLN
10.0000 mL | Freq: Once | INTRAMUSCULAR | Status: AC
  Administered 2024-03-21: 10 mL via INTRADERMAL

## 2024-03-21 NOTE — Procedures (Signed)
 Interventional Radiology Procedure Note  Procedure: cholecystostomy and Gtube exchgs    Complications: None  Estimated Blood Loss:  0  Findings: Full report in pacs     EMERSON FREDERIC SPECKING, MD

## 2024-03-22 ENCOUNTER — Other Ambulatory Visit (HOSPITAL_COMMUNITY): Payer: Self-pay | Admitting: Interventional Radiology

## 2024-03-22 DIAGNOSIS — D509 Iron deficiency anemia, unspecified: Secondary | ICD-10-CM | POA: Diagnosis not present

## 2024-03-22 DIAGNOSIS — N186 End stage renal disease: Secondary | ICD-10-CM | POA: Diagnosis not present

## 2024-03-22 DIAGNOSIS — N2581 Secondary hyperparathyroidism of renal origin: Secondary | ICD-10-CM | POA: Diagnosis not present

## 2024-03-22 DIAGNOSIS — D631 Anemia in chronic kidney disease: Secondary | ICD-10-CM | POA: Diagnosis not present

## 2024-03-22 DIAGNOSIS — D689 Coagulation defect, unspecified: Secondary | ICD-10-CM | POA: Diagnosis not present

## 2024-03-22 DIAGNOSIS — R110 Nausea: Secondary | ICD-10-CM | POA: Diagnosis not present

## 2024-03-22 DIAGNOSIS — Z992 Dependence on renal dialysis: Secondary | ICD-10-CM | POA: Diagnosis not present

## 2024-03-22 DIAGNOSIS — R1032 Left lower quadrant pain: Secondary | ICD-10-CM

## 2024-03-23 ENCOUNTER — Telehealth (HOSPITAL_COMMUNITY): Payer: Self-pay

## 2024-03-23 DIAGNOSIS — E1122 Type 2 diabetes mellitus with diabetic chronic kidney disease: Secondary | ICD-10-CM | POA: Diagnosis not present

## 2024-03-23 DIAGNOSIS — Z992 Dependence on renal dialysis: Secondary | ICD-10-CM | POA: Diagnosis not present

## 2024-03-23 DIAGNOSIS — N186 End stage renal disease: Secondary | ICD-10-CM | POA: Diagnosis not present

## 2024-03-23 NOTE — Telephone Encounter (Signed)
Called to schedule drain exchange, no answer, left vm. AB 

## 2024-03-24 DIAGNOSIS — D689 Coagulation defect, unspecified: Secondary | ICD-10-CM | POA: Diagnosis not present

## 2024-03-24 DIAGNOSIS — E1122 Type 2 diabetes mellitus with diabetic chronic kidney disease: Secondary | ICD-10-CM | POA: Diagnosis not present

## 2024-03-24 DIAGNOSIS — D509 Iron deficiency anemia, unspecified: Secondary | ICD-10-CM | POA: Diagnosis not present

## 2024-03-24 DIAGNOSIS — R110 Nausea: Secondary | ICD-10-CM | POA: Diagnosis not present

## 2024-03-24 DIAGNOSIS — N2581 Secondary hyperparathyroidism of renal origin: Secondary | ICD-10-CM | POA: Diagnosis not present

## 2024-03-24 DIAGNOSIS — Z992 Dependence on renal dialysis: Secondary | ICD-10-CM | POA: Diagnosis not present

## 2024-03-24 DIAGNOSIS — D631 Anemia in chronic kidney disease: Secondary | ICD-10-CM | POA: Diagnosis not present

## 2024-03-28 DIAGNOSIS — E1122 Type 2 diabetes mellitus with diabetic chronic kidney disease: Secondary | ICD-10-CM | POA: Diagnosis not present

## 2024-03-28 DIAGNOSIS — Z794 Long term (current) use of insulin: Secondary | ICD-10-CM | POA: Diagnosis not present

## 2024-03-28 DIAGNOSIS — N186 End stage renal disease: Secondary | ICD-10-CM | POA: Diagnosis not present

## 2024-03-28 DIAGNOSIS — Z992 Dependence on renal dialysis: Secondary | ICD-10-CM | POA: Diagnosis not present

## 2024-03-29 DIAGNOSIS — D631 Anemia in chronic kidney disease: Secondary | ICD-10-CM | POA: Diagnosis not present

## 2024-03-31 DIAGNOSIS — D509 Iron deficiency anemia, unspecified: Secondary | ICD-10-CM | POA: Diagnosis not present

## 2024-03-31 DIAGNOSIS — N186 End stage renal disease: Secondary | ICD-10-CM | POA: Diagnosis not present

## 2024-03-31 DIAGNOSIS — N2581 Secondary hyperparathyroidism of renal origin: Secondary | ICD-10-CM | POA: Diagnosis not present

## 2024-03-31 DIAGNOSIS — Z992 Dependence on renal dialysis: Secondary | ICD-10-CM | POA: Diagnosis not present

## 2024-03-31 DIAGNOSIS — R110 Nausea: Secondary | ICD-10-CM | POA: Diagnosis not present

## 2024-03-31 DIAGNOSIS — E1122 Type 2 diabetes mellitus with diabetic chronic kidney disease: Secondary | ICD-10-CM | POA: Diagnosis not present

## 2024-03-31 DIAGNOSIS — D631 Anemia in chronic kidney disease: Secondary | ICD-10-CM | POA: Diagnosis not present

## 2024-03-31 DIAGNOSIS — D689 Coagulation defect, unspecified: Secondary | ICD-10-CM | POA: Diagnosis not present

## 2024-04-03 DIAGNOSIS — D689 Coagulation defect, unspecified: Secondary | ICD-10-CM | POA: Diagnosis not present

## 2024-04-03 DIAGNOSIS — Z992 Dependence on renal dialysis: Secondary | ICD-10-CM | POA: Diagnosis not present

## 2024-04-03 DIAGNOSIS — D509 Iron deficiency anemia, unspecified: Secondary | ICD-10-CM | POA: Diagnosis not present

## 2024-04-03 DIAGNOSIS — E1122 Type 2 diabetes mellitus with diabetic chronic kidney disease: Secondary | ICD-10-CM | POA: Diagnosis not present

## 2024-04-03 DIAGNOSIS — N2581 Secondary hyperparathyroidism of renal origin: Secondary | ICD-10-CM | POA: Diagnosis not present

## 2024-04-03 DIAGNOSIS — N186 End stage renal disease: Secondary | ICD-10-CM | POA: Diagnosis not present

## 2024-04-03 DIAGNOSIS — R110 Nausea: Secondary | ICD-10-CM | POA: Diagnosis not present

## 2024-04-05 DIAGNOSIS — R110 Nausea: Secondary | ICD-10-CM | POA: Diagnosis not present

## 2024-04-07 DIAGNOSIS — Z992 Dependence on renal dialysis: Secondary | ICD-10-CM | POA: Diagnosis not present

## 2024-04-10 DIAGNOSIS — E1122 Type 2 diabetes mellitus with diabetic chronic kidney disease: Secondary | ICD-10-CM | POA: Diagnosis not present

## 2024-04-12 ENCOUNTER — Other Ambulatory Visit: Payer: Self-pay | Admitting: Sports Medicine

## 2024-04-12 DIAGNOSIS — I429 Cardiomyopathy, unspecified: Secondary | ICD-10-CM | POA: Insufficient documentation

## 2024-04-12 DIAGNOSIS — R1032 Left lower quadrant pain: Secondary | ICD-10-CM

## 2024-04-12 DIAGNOSIS — I2510 Atherosclerotic heart disease of native coronary artery without angina pectoris: Secondary | ICD-10-CM | POA: Insufficient documentation

## 2024-04-12 DIAGNOSIS — I340 Nonrheumatic mitral (valve) insufficiency: Secondary | ICD-10-CM | POA: Insufficient documentation

## 2024-04-12 DIAGNOSIS — N186 End stage renal disease: Secondary | ICD-10-CM | POA: Diagnosis not present

## 2024-04-12 NOTE — Progress Notes (Unsigned)
 Cardiology Office Note:   Date:  04/13/2024  ID:  Terressa Moulton, DOB 1946/08/22, MRN [id number] PCP: Sherlynn Madden, MD  [facility] Providers Cardiologist:  None {  History of Present Illness:   Samantha [name] is a 78 y.o. 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  She was in the ED for acute cholecystitis.  She has an EF of 45%.  She had basal-mid lateral wall and inferolateral wall hypokinesis.   At the first visit I did not have a list of meds or previous records.   I did receive some records eventually.  She had an echocardiogram at [facility].  This was 2021.  EF was 35 to 40%.  There were wall motion abnormalities as above.  There is moderate mitral regurgitation.     She was in the hospital in 2021 in [place]  and was managed for TTP.  I do see mention of congestive heart failure.  They mention a 2009 stress perfusion study that was negative for any evidence of ischemia.  Apparently her heart failure dates back to then.   Of note her daughters say that in 2021 she did have a cardiac cath.  There was mention of maybe doing a cardiac stent but since that event so much going on she was managed medically.  Apparently her ejection fraction was low enough that she was sent home with a LifeVest for about a year.  However, her daughter said her ejection fraction eventually improved to 50%.     She returns for follow up.  She is in a wheelchair following a stroke.  She has a PEG tube for feeding.  She has had a couple hospitalizations since I saw her.  I did review these records.  They really were abdominal pain associated with PEG tube.  She has not had any new cardiac problems.  She gets dialysis and tolerates this.  She is confined to the bed or wheelchair and gets up at a [employer] lift.  She has a gallbladder drain.  She has a PEG tube.  She has her dialysis port in her right upper chest.  She is very limited following her stroke but her daughter takes excellent  care of her.  She has a caregiver.  She denies pain.  She sleeps with her head propped in a bed and has had no new shortness of breath, PND or orthopnea.  She has had no new palpitations, presyncope or syncope.  She denies any chest discomfort.  She has had a mild cough.  ROS: As stated in the HPI and negative for all other systems.  Studies Reviewed:    EKG:      Normal sinus rhythm, rate 73, left axis deviation, poor anterior R wave progression, no acute ST-T wave changes.   Risk Assessment/Calculations:              Physical Exam:   VS:  BP 125/80   Pulse 83   Ht 4' 11 (1.499 m)   Wt 130 lb (59 kg)   SpO2 97%   BMI 26.26 kg/m    Wt Readings from Last 3 Encounters:  04/13/24 130 lb (59 kg)  03/05/24 128 lb (58.1 kg)  01/18/24 125 lb (56.7 kg)     GEN: Well nourished, well developed in no acute distress NECK: No JVD; No carotid bruits CARDIAC: RRR, no murmurs, rubs, gallops RESPIRATORY:  Clear to auscultation without rales, wheezing or rhonchi  ABDOMEN: Soft, non-tender, non-distended EXTREMITIES:  No edema; No deformity, status post hemiparesis with CVA  ASSESSMENT AND PLAN:   Cardiomyopathy: EF was 40 to 45% in April 2024.  I would like to reassess with an echocardiogram.  This will also allow me to follow-up mitral regurgitation.  Otherwise no change in therapy.  He seems to be having volume managed adequately through dialysis.    HTN: Her blood pressure is at target.  No change in therapy.  DVT:   I discussed this with her daughter.  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  No change in therapy.  She is tolerating anticoagulation.  Mitral regurgitation: This was mild on follow-up echo.    CAD: She is not having I will continue with medical management.  No change in therapy.    She will continue with risk reduction.  Her A1c was good.  Her LDL was 70.  No change in therapy.  Follow up with me in 1 year or sooner if  needed  Signed, Lynwood Schilling, MD

## 2024-04-13 ENCOUNTER — Ambulatory Visit: Attending: Cardiology | Admitting: Cardiology

## 2024-04-13 ENCOUNTER — Encounter: Payer: Self-pay | Admitting: Cardiology

## 2024-04-13 VITALS — BP 125/80 | HR 83 | Ht 59.0 in | Wt 130.0 lb

## 2024-04-13 DIAGNOSIS — I340 Nonrheumatic mitral (valve) insufficiency: Secondary | ICD-10-CM

## 2024-04-13 DIAGNOSIS — I2510 Atherosclerotic heart disease of native coronary artery without angina pectoris: Secondary | ICD-10-CM

## 2024-04-13 DIAGNOSIS — I429 Cardiomyopathy, unspecified: Secondary | ICD-10-CM | POA: Diagnosis not present

## 2024-04-13 DIAGNOSIS — I10 Essential (primary) hypertension: Secondary | ICD-10-CM

## 2024-04-13 NOTE — Patient Instructions (Signed)
 Medication Instructions:  Your physician recommends that you continue on your current medications as directed. Please refer to the Current Medication list given to you today.  *If you need a refill on your cardiac medications before your next appointment, please call your pharmacy*  Lab Work: NONE If you have labs (blood work) drawn today and your tests are completely normal, you will receive your results only by: MyChart Message (if you have MyChart) OR A paper copy in the mail If you have any lab test that is abnormal or we need to change your treatment, we will call you to review the results.  Testing/Procedures: Echocardiogram Your physician has requested that you have an echocardiogram. Echocardiography is a painless test that uses sound waves to create images of your heart. It provides your doctor with information about the size and shape of your heart and how well your heart's chambers and valves are working. This procedure takes approximately one hour. There are no restrictions for this procedure. Please do NOT wear cologne, perfume, aftershave, or lotions (deodorant is allowed). Please arrive 15 minutes prior to your appointment time.  Please note: We ask at that you not bring children with you during ultrasound (echo/ vascular) testing. Due to room size and safety concerns, children are not allowed in the ultrasound rooms during exams. Our front office staff cannot provide observation of children in our lobby area while testing is being conducted. An adult accompanying a patient to their appointment will only be allowed in the ultrasound room at the discretion of the ultrasound technician under special circumstances. We apologize for any inconvenience.   Follow-Up: At [facility], you and your health needs are our priority.  As part of our continuing mission to provide you with exceptional heart care, our providers are all part of one team.  This team includes your primary  Cardiologist (physician) and Advanced Practice Providers or APPs (Physician Assistants and Nurse Practitioners) who all work together to provide you with the care you need, when you need it.  Your next appointment:   1 year  Provider:   Lavona, MD  We recommend signing up for the patient portal called MyChart.  Sign up information is provided on this After Visit Summary.  MyChart is used to connect with patients for Virtual Visits (Telemedicine).  Patients are able to view lab/test results, encounter notes, upcoming appointments, etc.  Non-urgent messages can be sent to your provider as well.   To learn more about what you can do with MyChart, go to [email or web address].

## 2024-04-14 DIAGNOSIS — D509 Iron deficiency anemia, unspecified: Secondary | ICD-10-CM | POA: Diagnosis not present

## 2024-04-14 DIAGNOSIS — N2581 Secondary hyperparathyroidism of renal origin: Secondary | ICD-10-CM | POA: Diagnosis not present

## 2024-04-14 DIAGNOSIS — E46 Unspecified protein-calorie malnutrition: Secondary | ICD-10-CM | POA: Diagnosis not present

## 2024-04-14 DIAGNOSIS — D631 Anemia in chronic kidney disease: Secondary | ICD-10-CM | POA: Diagnosis not present

## 2024-04-14 DIAGNOSIS — R110 Nausea: Secondary | ICD-10-CM | POA: Diagnosis not present

## 2024-04-14 DIAGNOSIS — E1122 Type 2 diabetes mellitus with diabetic chronic kidney disease: Secondary | ICD-10-CM | POA: Diagnosis not present

## 2024-04-14 DIAGNOSIS — Z992 Dependence on renal dialysis: Secondary | ICD-10-CM | POA: Diagnosis not present

## 2024-04-14 DIAGNOSIS — Z431 Encounter for attention to gastrostomy: Secondary | ICD-10-CM | POA: Diagnosis not present

## 2024-04-14 DIAGNOSIS — Z9049 Acquired absence of other specified parts of digestive tract: Secondary | ICD-10-CM | POA: Diagnosis not present

## 2024-04-14 DIAGNOSIS — M6281 Muscle weakness (generalized): Secondary | ICD-10-CM | POA: Diagnosis not present

## 2024-04-14 DIAGNOSIS — D689 Coagulation defect, unspecified: Secondary | ICD-10-CM | POA: Diagnosis not present

## 2024-04-14 DIAGNOSIS — N186 End stage renal disease: Secondary | ICD-10-CM | POA: Diagnosis not present

## 2024-04-17 DIAGNOSIS — Z931 Gastrostomy status: Secondary | ICD-10-CM | POA: Diagnosis not present

## 2024-04-17 DIAGNOSIS — I222 Subsequent non-ST elevation (NSTEMI) myocardial infarction: Secondary | ICD-10-CM | POA: Diagnosis not present

## 2024-04-17 DIAGNOSIS — N186 End stage renal disease: Secondary | ICD-10-CM | POA: Diagnosis not present

## 2024-04-17 DIAGNOSIS — E7849 Other hyperlipidemia: Secondary | ICD-10-CM | POA: Diagnosis not present

## 2024-04-17 DIAGNOSIS — R1312 Dysphagia, oropharyngeal phase: Secondary | ICD-10-CM | POA: Diagnosis not present

## 2024-04-18 ENCOUNTER — Ambulatory Visit: Admitting: Family Medicine

## 2024-04-20 DIAGNOSIS — D696 Thrombocytopenia, unspecified: Secondary | ICD-10-CM | POA: Diagnosis not present

## 2024-04-20 DIAGNOSIS — D649 Anemia, unspecified: Secondary | ICD-10-CM | POA: Diagnosis not present

## 2024-04-21 DIAGNOSIS — N2581 Secondary hyperparathyroidism of renal origin: Secondary | ICD-10-CM | POA: Diagnosis not present

## 2024-04-21 DIAGNOSIS — E1122 Type 2 diabetes mellitus with diabetic chronic kidney disease: Secondary | ICD-10-CM | POA: Diagnosis not present

## 2024-04-21 DIAGNOSIS — D509 Iron deficiency anemia, unspecified: Secondary | ICD-10-CM | POA: Diagnosis not present

## 2024-04-21 DIAGNOSIS — D689 Coagulation defect, unspecified: Secondary | ICD-10-CM | POA: Diagnosis not present

## 2024-04-21 DIAGNOSIS — D631 Anemia in chronic kidney disease: Secondary | ICD-10-CM | POA: Diagnosis not present

## 2024-04-21 DIAGNOSIS — R110 Nausea: Secondary | ICD-10-CM | POA: Diagnosis not present

## 2024-04-21 DIAGNOSIS — N186 End stage renal disease: Secondary | ICD-10-CM | POA: Diagnosis not present

## 2024-04-21 DIAGNOSIS — Z992 Dependence on renal dialysis: Secondary | ICD-10-CM | POA: Diagnosis not present

## 2024-04-22 DIAGNOSIS — N186 End stage renal disease: Secondary | ICD-10-CM | POA: Diagnosis not present

## 2024-04-23 DIAGNOSIS — Z992 Dependence on renal dialysis: Secondary | ICD-10-CM | POA: Diagnosis not present

## 2024-04-23 DIAGNOSIS — E1122 Type 2 diabetes mellitus with diabetic chronic kidney disease: Secondary | ICD-10-CM | POA: Diagnosis not present

## 2024-04-23 DIAGNOSIS — N186 End stage renal disease: Secondary | ICD-10-CM | POA: Diagnosis not present

## 2024-04-24 DIAGNOSIS — D509 Iron deficiency anemia, unspecified: Secondary | ICD-10-CM | POA: Diagnosis not present

## 2024-04-24 DIAGNOSIS — Z992 Dependence on renal dialysis: Secondary | ICD-10-CM | POA: Diagnosis not present

## 2024-04-24 DIAGNOSIS — N186 End stage renal disease: Secondary | ICD-10-CM | POA: Diagnosis not present

## 2024-04-24 DIAGNOSIS — D689 Coagulation defect, unspecified: Secondary | ICD-10-CM | POA: Diagnosis not present

## 2024-04-24 DIAGNOSIS — N2581 Secondary hyperparathyroidism of renal origin: Secondary | ICD-10-CM | POA: Diagnosis not present

## 2024-04-24 DIAGNOSIS — R52 Pain, unspecified: Secondary | ICD-10-CM | POA: Diagnosis not present

## 2024-04-24 DIAGNOSIS — D631 Anemia in chronic kidney disease: Secondary | ICD-10-CM | POA: Diagnosis not present

## 2024-04-25 ENCOUNTER — Ambulatory Visit: Admitting: Sports Medicine

## 2024-04-26 DIAGNOSIS — Z992 Dependence on renal dialysis: Secondary | ICD-10-CM | POA: Diagnosis not present

## 2024-04-26 DIAGNOSIS — R52 Pain, unspecified: Secondary | ICD-10-CM | POA: Diagnosis not present

## 2024-04-26 DIAGNOSIS — D631 Anemia in chronic kidney disease: Secondary | ICD-10-CM | POA: Diagnosis not present

## 2024-04-26 DIAGNOSIS — D689 Coagulation defect, unspecified: Secondary | ICD-10-CM | POA: Diagnosis not present

## 2024-04-26 DIAGNOSIS — N2581 Secondary hyperparathyroidism of renal origin: Secondary | ICD-10-CM | POA: Diagnosis not present

## 2024-04-26 DIAGNOSIS — N186 End stage renal disease: Secondary | ICD-10-CM | POA: Diagnosis not present

## 2024-04-26 DIAGNOSIS — D509 Iron deficiency anemia, unspecified: Secondary | ICD-10-CM | POA: Diagnosis not present

## 2024-05-01 DIAGNOSIS — Z992 Dependence on renal dialysis: Secondary | ICD-10-CM | POA: Diagnosis not present

## 2024-05-02 ENCOUNTER — Encounter: Payer: Self-pay | Admitting: Sports Medicine

## 2024-05-02 ENCOUNTER — Ambulatory Visit (INDEPENDENT_AMBULATORY_CARE_PROVIDER_SITE_OTHER): Admitting: Sports Medicine

## 2024-05-02 VITALS — BP 120/78 | HR 105 | Temp 98.6°F | Ht 59.0 in

## 2024-05-02 DIAGNOSIS — F0280 Dementia in other diseases classified elsewhere without behavioral disturbance: Secondary | ICD-10-CM

## 2024-05-02 DIAGNOSIS — I2510 Atherosclerotic heart disease of native coronary artery without angina pectoris: Secondary | ICD-10-CM | POA: Diagnosis not present

## 2024-05-02 DIAGNOSIS — Z23 Encounter for immunization: Secondary | ICD-10-CM | POA: Diagnosis not present

## 2024-05-02 DIAGNOSIS — I10 Essential (primary) hypertension: Secondary | ICD-10-CM

## 2024-05-02 DIAGNOSIS — G309 Alzheimer's disease, unspecified: Secondary | ICD-10-CM

## 2024-05-02 DIAGNOSIS — Z86718 Personal history of other venous thrombosis and embolism: Secondary | ICD-10-CM

## 2024-05-02 DIAGNOSIS — E119 Type 2 diabetes mellitus without complications: Secondary | ICD-10-CM

## 2024-05-02 DIAGNOSIS — Z794 Long term (current) use of insulin: Secondary | ICD-10-CM

## 2024-05-02 DIAGNOSIS — R000 Tachycardia, unspecified: Secondary | ICD-10-CM

## 2024-05-02 DIAGNOSIS — Z113 Encounter for screening for infections with a predominantly sexual mode of transmission: Secondary | ICD-10-CM

## 2024-05-02 DIAGNOSIS — N186 End stage renal disease: Secondary | ICD-10-CM

## 2024-05-02 MED ORDER — CARVEDILOL 3.125 MG PO TABS: ORAL_TABLET | ORAL | 5 refills | Status: AC

## 2024-05-02 MED ORDER — INSULIN GLARGINE 100 UNIT/ML SOLOSTAR PEN: 18.0000 [IU] | PEN_INJECTOR | Freq: Every day | SUBCUTANEOUS | Status: DC

## 2024-05-02 MED ORDER — SPIKEVAX 50 MCG/0.5ML IM SUSP
0.5000 mL | INTRAMUSCULAR | 1 refills | Status: AC
Start: 2024-05-02 — End: ?

## 2024-05-02 NOTE — Progress Notes (Signed)
 Careteam: Patient Care Team: Sherlynn Madden, MD as PCP - General (Internal Medicine)  PLACE OF SERVICE:  [facility]  Advanced Directive information    Allergies  Allergen Reactions   Asa [Aspirin] Anaphylaxis   Penicillins Anaphylaxis    **Tolerates cephalosporins    No chief complaint on file.    Discussed the use of AI scribe software for clinical note transcription with the patient, who gave verbal consent to proceed.  History of Present Illness    Samantha [name] is a 78 year old female who presents for follow up. She is accompanied by her daughter and a caregiver.  She has been experiencing elevated heart rate and blood pressure for the past two to three days, with her heart rate reaching as high as 110 bpm. Her blood pressure was recorded at 146/86 mmHg before medication, with fluctuations noted. The family is concerned about these elevated readings, especially when the heart rate exceeds 100 bpm. No chest pain, shortness of breath, or palpitations.  She is currently on Eliquis  2.5 mg twice daily, Lipitor 10 mg daily, Coreg  3.125 mg twice daily, and insulin , which was recently increased to 18 units nightly. She also takes Senna and Colace as needed, usually at night, to manage bowel movements. She is on a PEG tube for nutrition, which is used from 8 PM to 6 AM, and she is not eating much by mouth during the day.  She is bedbound and requires a Nurse, adult for transfers. She has a caregiver seven days a week for 52 hours. She wears diapers and has a gallbladder drain and a PEG tube, both of which are scheduled for review and potential replacement every six weeks.    She has not experienced any recent falls, fever, or significant changes in her condition. She is able to communicate discomfort and sometimes becomes agitated during care. She sleeps irregularly, often staying up at night to watch TV and sleeping during the day. No recent eye exam and requires a referral  for one. No abdominal pain, nausea, burning with urination, or fever. She reports making urine and denies any recent falls or depressive symptoms.  Review of Systems:  Review of Systems  Constitutional:  Negative for chills and fever.  Respiratory:  Negative for cough, sputum production and shortness of breath.   Cardiovascular:  Negative for chest pain, palpitations and leg swelling.  Genitourinary:  Negative for dysuria.  Musculoskeletal:  Negative for falls.  Neurological:  Negative for dizziness.   Negative unless indicated in HPI.   Past Medical History:  Diagnosis Date   Cerebrovascular accident (CVA) due to occlusion of cerebral artery (HCC) 07/12/2019   CVA (cerebral vascular accident) (HCC)    residual left sided weakness   Dementia (HCC)    Diabetes (HCC)    Diabetes mellitus without complication (HCC)    ESRD (end stage renal disease) on dialysis (HCC)    Heart failure (HCC)    History of CT scan    History of heart attack    History of MRI    History of stroke    HTN (hypertension)    Kidney failure    PEG (percutaneous endoscopic gastrostomy) status (HCC)    Sepsis (HCC) 04/01/2023   SIRS (systemic inflammatory response syndrome) (HCC) 04/06/2023   Thyroid  disease    Past Surgical History:  Procedure Laterality Date   ABDOMINAL HYSTERECTOMY     GALLBLADDER SURGERY     IR EXCHANGE BILIARY DRAIN  02/11/2023   IR EXCHANGE  BILIARY DRAIN  04/13/2023   IR EXCHANGE BILIARY DRAIN  06/15/2023   IR EXCHANGE BILIARY DRAIN  08/23/2023   IR EXCHANGE BILIARY DRAIN  10/05/2023   IR EXCHANGE BILIARY DRAIN  11/30/2023   IR EXCHANGE BILIARY DRAIN  01/25/2024   IR EXCHANGE BILIARY DRAIN  03/21/2024   IR PERC CHOLECYSTOSTOMY  12/16/2022   IR REPLACE G-TUBE SIMPLE WO FLUORO  08/23/2023   IR REPLC GASTRO/COLONIC TUBE PERCUT W/FLUORO  06/15/2023   IR REPLC GASTRO/COLONIC TUBE PERCUT W/FLUORO  10/05/2023   IR REPLC GASTRO/COLONIC TUBE PERCUT W/FLUORO  11/30/2023   IR REPLC  GASTRO/COLONIC TUBE PERCUT W/FLUORO  03/21/2024   Social History:   reports that she has never smoked. She does not have any smokeless tobacco history on file. She reports that she does not currently use alcohol. She reports that she does not use drugs.  Family History  Problem Relation Age of Onset   Diabetes Mother     Medications: Patient's Medications  New Prescriptions   No medications on file  Previous Medications   ACETAMINOPHEN  (TYLENOL ) 500 MG TABLET    Take 500 mg by mouth daily as needed for mild pain (pain score 1-3).   APIXABAN  (ELIQUIS ) 2.5 MG TABS TABLET    Take 1 tablet (2.5 mg total) by mouth 2 (two) times daily.   ATORVASTATIN  (LIPITOR) 10 MG TABLET    Take 1 tablet by mouth once daily   CARVEDILOL  (COREG ) 3.125 MG TABLET    Take 3.125 mg Q AM  and 3.125 mg Q PM on non dialysis days (Tues, Thurs, Sat and Sun,  - On dialysis days,  MWF, continue Coreg  3.125 mg Q PM -  Hold for SBP < 100   GLUCAGON (BAQSIMI ONE PACK) 3 MG/DOSE POWD    Place 1 packet into the nose as needed (For blood sugar). Use when blood sugar drops below 70.   INSULIN  ASPART (NOVOLOG ) 100 UNIT/ML INJECTION    0-9 Units, Subcutaneous, 3 times daily with meals, CBG < 70: Implement Hypoglycemia measures CBG 70 - 120: 0 units CBG 121 - 150: 1 unit CBG 151 - 200: 2 units CBG 201 - 250: 3 units CBG 251 - 300: 5 units CBG 301 - 350: 7 units CBG 351 - 400: 9 units CBG > 400: call MD   INSULIN  GLARGINE (LANTUS ) 100 UNIT/ML SOLOSTAR PEN    Inject 15 Units into the skin at bedtime.   LOKELMA 10 G PACK PACKET    Take 1 packet by mouth as directed. Taken tues, thur, sat   METHOXY PEG-EPOETIN  BETA (MIRCERA IJ)    75 mcg as directed. Taken at dialysis   MULTIPLE VITAMINS-MINERALS (MULTIVITAMIN WITH MINERALS) TABLET    Take 1 tablet by mouth at bedtime.   NUTRITIONAL SUPPLEMENTS (FEEDING SUPPLEMENT, OSMOLITE 1.5 CAL,) LIQD    Place 600 mLs into feeding tube See admin instructions. 8p-6a   POLYETHYLENE GLYCOL POWDER  (MIRALAX ) 17 GM/SCOOP POWDER    Take 17 g by mouth 2 (two) times daily as needed for moderate constipation or mild constipation.   PROMETHAZINE  (PHENERGAN ) 25 MG TABLET    Take 0.5 tablets (12.5 mg total) by mouth every 6 (six) hours as needed for nausea or vomiting.   SENNA (SENOKOT) 8.6 MG TABS TABLET    Take 1 tablet by mouth at bedtime.   SENNA-DOCUSATE (SENOKOT-S) 8.6-50 MG TABLET    Take 1-2 tablets by mouth 2 (two) times daily between meals as needed for mild constipation.  Modified  Medications   No medications on file  Discontinued Medications   No medications on file    Physical Exam: There were no vitals filed for this visit. There is no height or weight on file to calculate BMI. BP Readings from Last 3 Encounters:  04/13/24 125/80  03/06/24 (!) 141/64  01/18/24 110/68   Wt Readings from Last 3 Encounters:  04/13/24 130 lb (59 kg)  03/05/24 128 lb (58.1 kg)  01/18/24 125 lb (56.7 kg)    Physical Exam Constitutional:      Appearance: Normal appearance.  HENT:     Head: Normocephalic and atraumatic.  Cardiovascular:     Rate and Rhythm: Normal rate and regular rhythm.  Pulmonary:     Effort: Pulmonary effort is normal. No respiratory distress.     Breath sounds: Normal breath sounds. No wheezing.  Abdominal:     General: Bowel sounds are normal. There is no distension.     Tenderness: There is no abdominal tenderness. There is no guarding or rebound.     Comments:    Musculoskeletal:        General: No swelling.  Neurological:     Mental Status: She is alert. Mental status is at baseline.     Motor: No weakness.     Labs reviewed: Basic Metabolic Panel: Recent Labs    06/16/23 2226 09/01/23 1954 09/27/23 0406 09/27/23 0407 12/07/23 0409 12/08/23 0357 12/09/23 0335 03/05/24 1939  NA 137   < >  --    < > 135 131* 133* 132*  K 3.3*   < >  --    < > 5.5* 4.2 4.0 5.2*  CL 94*   < >  --    < > 97* 95* 94* 93*  CO2 31   < >  --    < > 25 27 25 26    GLUCOSE 161*   < >  --    < > 114* 278* 276* 94  BUN 18   < >  --    < > 31* 13 10 28*  CREATININE 2.29*   < >  --    < > 3.92* 2.48* 2.21* 3.25*  CALCIUM  8.7*   < >  --    < > 9.1 8.9 8.7* 9.3  MG 2.3  --  2.1  --   --   --   --   --   PHOS  --    < >  --    < > 3.9 3.4 3.0  --   TSH 3.036  --   --   --   --  3.990  --   --    < > = values in this interval not displayed.   Liver Function Tests: Recent Labs    12/06/23 0730 12/07/23 0406 12/07/23 0409 12/08/23 0357 12/09/23 0335 03/05/24 1939  AST 30 33  --   --   --  25  ALT 28 29  --   --   --  15  ALKPHOS 159* 146*  --   --   --  232*  BILITOT 0.6 0.5  --   --   --  0.9  PROT 7.1 6.9  --   --   --  6.7  ALBUMIN 3.1* 3.1*   < > 2.7* 2.7* 3.5   < > = values in this interval not displayed.   Recent Labs    06/16/23 2226 09/01/23 1954 09/25/23 0206  LIPASE 60*  55* 60*   No results for input(s): AMMONIA in the last 8760 hours. CBC: Recent Labs    12/06/23 0701 12/07/23 0408 12/08/23 0357 12/09/23 0335 03/05/24 1939  WBC 9.0   < > 6.2 5.3 6.9  NEUTROABS 5.4  --  3.7  --  3.5  HGB 10.7*   < > 8.6* 8.7* 10.9*  HCT 32.7*   < > 26.6* 26.7* 33.8*  MCV 92.4   < > 90.5 91.1 93.4  PLT 149*   < > 138* 148* 140*   < > = values in this interval not displayed.   Lipid Panel: No results for input(s): CHOL, HDL, LDLCALC, TRIG, CHOLHDL, LDLDIRECT in the last 8760 hours. TSH: Recent Labs    06/16/23 2226 12/08/23 0357  TSH 3.036 3.990   A1C: Lab Results  Component Value Date   HGBA1C 6.6 (H) 09/25/2023    Assessment and Plan Assessment & Plan   1. Immunization due  - COVID-19 mRNA vaccine, Moderna, >/= 53yrs, (SPIKEVAX ) injection; Inject 0.5 mLs into the muscle as directed.  Dispense: 0.5 mL; Refill: 1  2. Essential hypertension (Primary) At goal today    3. Coronary artery disease involving native coronary artery of native heart without angina pectoris Stable  Denies chest pain , sob   Recently followed with cardiology    4. Type 2 diabetes mellitus without complication, with long-term current use of insulin  (HCC) A1c at goal  Currently on 18 units insulin   Pt recently followed with endo  Foot exam done today - Ambulatory referral to Ophthalmology  5. Dementia due to Alzheimer's disease (HCC) Intermittent agitation during care Pt seems pleasant and comfortable today   6. ESRD (end stage renal disease) (HCC) Follow up with nephrology  7. History of DVT (deep vein thrombosis) No signs of bleeding Cont with eliquis   8. Screening for STD (sexually transmitted disease)  - Hepatitis C antibody  Other orders - insulin  glargine (LANTUS ) 100 UNIT/ML Solostar Pen; Inject 18 Units into the skin at bedtime.      40 min Total time spent for obtaining history,  performing a medically appropriate examination and evaluation, reviewing the tests,ordering  tests,  documenting clinical information in the electronic or other health record,  care coordination (not separately reported)

## 2024-05-02 NOTE — Addendum Note (Signed)
 Addended by: Staley Budzinski E on: 05/02/2024 01:58 PM   Modules accepted: Orders

## 2024-05-04 ENCOUNTER — Other Ambulatory Visit (HOSPITAL_COMMUNITY): Payer: Self-pay | Admitting: Interventional Radiology

## 2024-05-04 ENCOUNTER — Ambulatory Visit (HOSPITAL_COMMUNITY)
Admission: RE | Admit: 2024-05-04 | Discharge: 2024-05-04 | Disposition: A | Discharge status: Home / Self Care | Source: Ambulatory Visit | Attending: Interventional Radiology | Admitting: Interventional Radiology

## 2024-05-04 DIAGNOSIS — R1032 Left lower quadrant pain: Secondary | ICD-10-CM | POA: Insufficient documentation

## 2024-05-04 DIAGNOSIS — Z434 Encounter for attention to other artificial openings of digestive tract: Secondary | ICD-10-CM | POA: Diagnosis not present

## 2024-05-04 DIAGNOSIS — K8020 Calculus of gallbladder without cholecystitis without obstruction: Secondary | ICD-10-CM

## 2024-05-04 HISTORY — PX: IR EXCHANGE BILIARY DRAIN: IMG6046

## 2024-05-04 MED ORDER — LIDOCAINE-EPINEPHRINE 1 %-1:100000 IJ SOLN: 20.0000 mL | Freq: Once | INTRAMUSCULAR | Status: DC

## 2024-05-04 MED ORDER — LIDOCAINE-EPINEPHRINE 1 %-1:100000 IJ SOLN
INTRAMUSCULAR | Status: AC
  Filled 2024-05-04: qty 1

## 2024-05-05 DIAGNOSIS — D689 Coagulation defect, unspecified: Secondary | ICD-10-CM | POA: Diagnosis not present

## 2024-05-08 DIAGNOSIS — D509 Iron deficiency anemia, unspecified: Secondary | ICD-10-CM | POA: Diagnosis not present

## 2024-05-08 DIAGNOSIS — R52 Pain, unspecified: Secondary | ICD-10-CM | POA: Diagnosis not present

## 2024-05-08 DIAGNOSIS — D631 Anemia in chronic kidney disease: Secondary | ICD-10-CM | POA: Diagnosis not present

## 2024-05-08 DIAGNOSIS — Z992 Dependence on renal dialysis: Secondary | ICD-10-CM | POA: Diagnosis not present

## 2024-05-08 DIAGNOSIS — N2581 Secondary hyperparathyroidism of renal origin: Secondary | ICD-10-CM | POA: Diagnosis not present

## 2024-05-08 DIAGNOSIS — N186 End stage renal disease: Secondary | ICD-10-CM | POA: Diagnosis not present

## 2024-05-10 ENCOUNTER — Encounter: Payer: Self-pay | Admitting: Nephrology

## 2024-05-10 DIAGNOSIS — N2581 Secondary hyperparathyroidism of renal origin: Secondary | ICD-10-CM | POA: Diagnosis not present

## 2024-05-10 DIAGNOSIS — Z992 Dependence on renal dialysis: Secondary | ICD-10-CM | POA: Diagnosis not present

## 2024-05-10 DIAGNOSIS — N186 End stage renal disease: Secondary | ICD-10-CM | POA: Diagnosis not present

## 2024-05-10 DIAGNOSIS — R52 Pain, unspecified: Secondary | ICD-10-CM | POA: Diagnosis not present

## 2024-05-10 DIAGNOSIS — D509 Iron deficiency anemia, unspecified: Secondary | ICD-10-CM | POA: Diagnosis not present

## 2024-05-10 DIAGNOSIS — D631 Anemia in chronic kidney disease: Secondary | ICD-10-CM | POA: Diagnosis not present

## 2024-05-10 DIAGNOSIS — D689 Coagulation defect, unspecified: Secondary | ICD-10-CM | POA: Diagnosis not present

## 2024-05-12 DIAGNOSIS — R52 Pain, unspecified: Secondary | ICD-10-CM | POA: Diagnosis not present

## 2024-05-12 DIAGNOSIS — D509 Iron deficiency anemia, unspecified: Secondary | ICD-10-CM | POA: Diagnosis not present

## 2024-05-12 DIAGNOSIS — N2581 Secondary hyperparathyroidism of renal origin: Secondary | ICD-10-CM | POA: Diagnosis not present

## 2024-05-12 DIAGNOSIS — N186 End stage renal disease: Secondary | ICD-10-CM | POA: Diagnosis not present

## 2024-05-12 DIAGNOSIS — Z992 Dependence on renal dialysis: Secondary | ICD-10-CM | POA: Diagnosis not present

## 2024-05-12 DIAGNOSIS — D631 Anemia in chronic kidney disease: Secondary | ICD-10-CM | POA: Diagnosis not present

## 2024-05-15 DIAGNOSIS — N2581 Secondary hyperparathyroidism of renal origin: Secondary | ICD-10-CM | POA: Diagnosis not present

## 2024-05-15 DIAGNOSIS — M6281 Muscle weakness (generalized): Secondary | ICD-10-CM | POA: Diagnosis not present

## 2024-05-15 DIAGNOSIS — Z9049 Acquired absence of other specified parts of digestive tract: Secondary | ICD-10-CM | POA: Diagnosis not present

## 2024-05-15 DIAGNOSIS — E46 Unspecified protein-calorie malnutrition: Secondary | ICD-10-CM | POA: Diagnosis not present

## 2024-05-15 DIAGNOSIS — Z431 Encounter for attention to gastrostomy: Secondary | ICD-10-CM | POA: Diagnosis not present

## 2024-05-16 ENCOUNTER — Ambulatory Visit: Admitting: Family

## 2024-05-17 ENCOUNTER — Ambulatory Visit: Payer: Self-pay | Admitting: Sports Medicine

## 2024-05-17 ENCOUNTER — Telehealth (HOSPITAL_COMMUNITY): Payer: Self-pay

## 2024-05-17 DIAGNOSIS — D631 Anemia in chronic kidney disease: Secondary | ICD-10-CM | POA: Diagnosis not present

## 2024-05-17 DIAGNOSIS — D689 Coagulation defect, unspecified: Secondary | ICD-10-CM | POA: Diagnosis not present

## 2024-05-17 DIAGNOSIS — I222 Subsequent non-ST elevation (NSTEMI) myocardial infarction: Secondary | ICD-10-CM | POA: Diagnosis not present

## 2024-05-17 DIAGNOSIS — R1312 Dysphagia, oropharyngeal phase: Secondary | ICD-10-CM | POA: Diagnosis not present

## 2024-05-17 DIAGNOSIS — N2581 Secondary hyperparathyroidism of renal origin: Secondary | ICD-10-CM | POA: Diagnosis not present

## 2024-05-17 DIAGNOSIS — D509 Iron deficiency anemia, unspecified: Secondary | ICD-10-CM | POA: Diagnosis not present

## 2024-05-17 DIAGNOSIS — Z931 Gastrostomy status: Secondary | ICD-10-CM | POA: Diagnosis not present

## 2024-05-17 DIAGNOSIS — N186 End stage renal disease: Secondary | ICD-10-CM | POA: Diagnosis not present

## 2024-05-17 DIAGNOSIS — Z992 Dependence on renal dialysis: Secondary | ICD-10-CM | POA: Diagnosis not present

## 2024-05-17 DIAGNOSIS — E7849 Other hyperlipidemia: Secondary | ICD-10-CM | POA: Diagnosis not present

## 2024-05-17 NOTE — Telephone Encounter (Signed)
 Pt's daughter called regarding some blood showing up in her draingage bag. Sent a message to our PA [place]) and she advised: Some blood in the bag is fine, it should still be thin liquid but will look pretty red from just a small amount of drops. She has gallstones so sometimes when they move it can cause some blood. If there are clots + a lot of frank blood in the bag or tubing she should be seen. Amount of output will change based on oral intake so would just recommend hydration and watching output for 24 hours and if it continues to decrease we can see her for a drain evaluation.   Relayed this message to pt's daughter and she was fine with this. She also stated that the pt is not having any pain at this time. She will continue to monitor and let us  know if things change. AB

## 2024-05-17 NOTE — Telephone Encounter (Signed)
 Outgoing call placed to patients daughter, no answer, and voicemail full. Mychart message was sent.

## 2024-05-18 ENCOUNTER — Ambulatory Visit (HOSPITAL_COMMUNITY)
Admission: RE | Admit: 2024-05-18 | Discharge: 2024-05-18 | Disposition: A | Discharge status: Home / Self Care | Source: Ambulatory Visit | Attending: Cardiology | Admitting: Cardiology

## 2024-05-18 DIAGNOSIS — I428 Other cardiomyopathies: Secondary | ICD-10-CM | POA: Diagnosis not present

## 2024-05-18 DIAGNOSIS — I120 Hypertensive chronic kidney disease with stage 5 chronic kidney disease or end stage renal disease: Secondary | ICD-10-CM | POA: Diagnosis not present

## 2024-05-18 DIAGNOSIS — I071 Rheumatic tricuspid insufficiency: Secondary | ICD-10-CM | POA: Diagnosis not present

## 2024-05-18 DIAGNOSIS — Z8673 Personal history of transient ischemic attack (TIA), and cerebral infarction without residual deficits: Secondary | ICD-10-CM | POA: Insufficient documentation

## 2024-05-18 DIAGNOSIS — N186 End stage renal disease: Secondary | ICD-10-CM | POA: Insufficient documentation

## 2024-05-18 DIAGNOSIS — I429 Cardiomyopathy, unspecified: Secondary | ICD-10-CM | POA: Diagnosis present

## 2024-05-18 DIAGNOSIS — I2510 Atherosclerotic heart disease of native coronary artery without angina pectoris: Secondary | ICD-10-CM | POA: Diagnosis not present

## 2024-05-18 DIAGNOSIS — Z86718 Personal history of other venous thrombosis and embolism: Secondary | ICD-10-CM | POA: Insufficient documentation

## 2024-05-18 DIAGNOSIS — F0390 Unspecified dementia without behavioral disturbance: Secondary | ICD-10-CM | POA: Insufficient documentation

## 2024-05-18 DIAGNOSIS — I10 Essential (primary) hypertension: Secondary | ICD-10-CM | POA: Diagnosis not present

## 2024-05-18 LAB — ECHOCARDIOGRAM COMPLETE: S' Lateral: 3.75 cm

## 2024-05-18 MED ORDER — PERFLUTREN LIPID MICROSPHERE: 1.0000 mL | INTRAVENOUS | Status: AC | PRN

## 2024-05-19 DIAGNOSIS — Z992 Dependence on renal dialysis: Secondary | ICD-10-CM | POA: Diagnosis not present

## 2024-05-22 ENCOUNTER — Ambulatory Visit: Payer: Self-pay | Admitting: Cardiology

## 2024-05-22 DIAGNOSIS — D509 Iron deficiency anemia, unspecified: Secondary | ICD-10-CM | POA: Diagnosis not present

## 2024-05-22 DIAGNOSIS — N2581 Secondary hyperparathyroidism of renal origin: Secondary | ICD-10-CM | POA: Diagnosis not present

## 2024-05-22 DIAGNOSIS — R52 Pain, unspecified: Secondary | ICD-10-CM | POA: Diagnosis not present

## 2024-05-22 DIAGNOSIS — Z992 Dependence on renal dialysis: Secondary | ICD-10-CM | POA: Diagnosis not present

## 2024-05-22 DIAGNOSIS — N186 End stage renal disease: Secondary | ICD-10-CM | POA: Diagnosis not present

## 2024-05-22 DIAGNOSIS — D689 Coagulation defect, unspecified: Secondary | ICD-10-CM | POA: Diagnosis not present

## 2024-05-22 DIAGNOSIS — D631 Anemia in chronic kidney disease: Secondary | ICD-10-CM | POA: Diagnosis not present

## 2024-05-23 DIAGNOSIS — N186 End stage renal disease: Secondary | ICD-10-CM | POA: Diagnosis not present

## 2024-05-23 DIAGNOSIS — Z992 Dependence on renal dialysis: Secondary | ICD-10-CM | POA: Diagnosis not present

## 2024-05-23 DIAGNOSIS — E1122 Type 2 diabetes mellitus with diabetic chronic kidney disease: Secondary | ICD-10-CM | POA: Diagnosis not present

## 2024-06-07 ENCOUNTER — Telehealth: Payer: Self-pay

## 2024-06-07 NOTE — Telephone Encounter (Signed)
 Another attempt made to reach patients daughter

## 2024-06-07 NOTE — Telephone Encounter (Signed)
 Dr.Veludandi gave me some paperwork and asked that I determine what it is asking for, as the writing is faint and the message stated: Please send updated list to [facility] for Monthly Supplies, however it is unclear as to what supplies are being requested.  I called [employer] and spoke with Vernell. Vernell transferred me to Jen- supply department/incontinence   Wyvonna stated they need a prescription/order with office notes faxed to [phone]. The prescription should specify the type of incontinence supplies and how many per month   I informed Jen that I will further clarify this request with patients daughter and then fax information as requested.   Outgoing call placed to Jocelyn, no answer and voicemail full. We will have to try again later.  We provided an order in July for Adult Diaper 2 XL, two times daily x 11 refills for a year supply. I need to ask Jocelyn what more is needed at this time

## 2024-06-12 ENCOUNTER — Other Ambulatory Visit: Payer: Self-pay | Admitting: Sports Medicine

## 2024-06-12 DIAGNOSIS — N186 End stage renal disease: Secondary | ICD-10-CM | POA: Diagnosis not present

## 2024-06-12 DIAGNOSIS — I6930 Unspecified sequelae of cerebral infarction: Secondary | ICD-10-CM

## 2024-06-12 DIAGNOSIS — E1122 Type 2 diabetes mellitus with diabetic chronic kidney disease: Secondary | ICD-10-CM

## 2024-06-14 DIAGNOSIS — Z431 Encounter for attention to gastrostomy: Secondary | ICD-10-CM | POA: Diagnosis not present

## 2024-06-14 DIAGNOSIS — N186 End stage renal disease: Secondary | ICD-10-CM | POA: Diagnosis not present

## 2024-06-14 DIAGNOSIS — Z9049 Acquired absence of other specified parts of digestive tract: Secondary | ICD-10-CM | POA: Diagnosis not present

## 2024-06-14 DIAGNOSIS — E46 Unspecified protein-calorie malnutrition: Secondary | ICD-10-CM | POA: Diagnosis not present

## 2024-06-14 DIAGNOSIS — M6281 Muscle weakness (generalized): Secondary | ICD-10-CM | POA: Diagnosis not present

## 2024-06-14 NOTE — Telephone Encounter (Signed)
 Order from July and office notes submitted again. I emphasized on the coversheet for the DME company to verify the correct supplies are being sent being sent.   .Refer to chart review- Misc Reports- Chart review:Routing History for documentation

## 2024-06-14 NOTE — Telephone Encounter (Signed)
 Another outgoing call placed to patients daughter, I left a message on voicemail requesting a return call to discuss if any orders are needed and if so, we will need specific details (we submitted an order in July 2025 for a year supply of incontinence supplies).  OK for E2C2 agent to record answer from patients daughter when call returned

## 2024-06-14 NOTE — Telephone Encounter (Signed)
 AttnBETHA Suellen Devin JAYSON, CMA   Samantha [name] is returning call and would like to first say that she has been receiving pads and not diapers.  Patient is need adult diapers XXL Chux or  incontinence pads or bed pads  Please advise

## 2024-06-17 ENCOUNTER — Telehealth: Admitting: Physician Assistant

## 2024-06-17 DIAGNOSIS — H1032 Unspecified acute conjunctivitis, left eye: Secondary | ICD-10-CM | POA: Diagnosis not present

## 2024-06-17 MED ORDER — ERYTHROMYCIN 5 MG/GM OP OINT: 1.0000 | TOPICAL_OINTMENT | Freq: Four times a day (QID) | OPHTHALMIC | 0 refills | Status: AC

## 2024-06-17 NOTE — Patient Instructions (Signed)
 Nawaal Tipping, thank you for joining [employer], PA-C for today's virtual visit.  While this provider is not your primary care provider (PCP), if your PCP is located in our provider database this encounter information will be shared with them immediately following your visit.   A [facility] MyChart account gives you access to today's visit and all your visits, tests, and labs performed at [facility]  click here if you don't have a [facility] MyChart account or go to [email or web address]  Consent: (Patient) Samantha [name] provided verbal consent for this virtual visit at the beginning of the encounter.  Current Medications:  Current Outpatient Medications:    erythromycin ophthalmic ointment, Place 1 Application into the left eye 4 (four) times daily for 5 days., Disp: 20 g, Rfl: 0   acetaminophen  (TYLENOL ) 500 MG tablet, Take 500 mg by mouth daily as needed for mild pain (pain score 1-3)., Disp: , Rfl:    apixaban  (ELIQUIS ) 2.5 MG TABS tablet, Take 1 tablet (2.5 mg total) by mouth 2 (two) times daily., Disp: 180 tablet, Rfl: 2   atorvastatin  (LIPITOR) 10 MG tablet, Take 1 tablet by mouth once daily, Disp: 90 tablet, Rfl: 0   carvedilol  (COREG ) 3.125 MG tablet, Take 3.125 mg Q AM  and 3.125 mg Q PM on non dialysis days (Tues, Thurs, Sat and Sun,  - On dialysis days,  MWF, continue Coreg  3.125 mg Q PM -  Hold for SBP < 100, Disp: 60 tablet, Rfl: 5   COVID-19 mRNA vaccine, Moderna, >/= 62yrs, (SPIKEVAX ) injection, Inject 0.5 mLs into the muscle as directed., Disp: 0.5 mL, Rfl: 1   Glucagon (BAQSIMI ONE PACK) 3 MG/DOSE POWD, Place 1 packet into the nose as needed (For blood sugar). Use when blood sugar drops below 70., Disp: , Rfl:    insulin  aspart (NOVOLOG ) 100 UNIT/ML injection, 0-9 Units, Subcutaneous, 3 times daily with meals, CBG < 70: Implement Hypoglycemia measures CBG 70 - 120: 0 units CBG 121 - 150: 1 unit CBG 151 - 200: 2 units CBG 201 - 250: 3 units CBG 251 -  300: 5 units CBG 301 - 350: 7 units CBG 351 - 400: 9 units CBG > 400: call MD, Disp: 10 mL, Rfl: 11   insulin  glargine (LANTUS ) 100 UNIT/ML Solostar Pen, Inject 18 Units into the skin at bedtime., Disp: , Rfl:    LOKELMA 10 g PACK packet, Take 1 packet by mouth as directed. Taken tues, thur, sat, Disp: , Rfl:    Methoxy PEG-Epoetin  Beta (MIRCERA IJ), 75 mcg as directed. Taken at dialysis, Disp: , Rfl:    Multiple Vitamins-Minerals (MULTIVITAMIN WITH MINERALS) tablet, Take 1 tablet by mouth at bedtime., Disp: , Rfl:    Nutritional Supplements (FEEDING SUPPLEMENT, OSMOLITE 1.5 CAL,) LIQD, Place 600 mLs into feeding tube See admin instructions. 8p-6a, Disp: , Rfl:    polyethylene glycol powder (MIRALAX ) 17 GM/SCOOP powder, Take 17 g by mouth 2 (two) times daily as needed for moderate constipation or mild constipation., Disp: 255 g, Rfl: 2   senna (SENOKOT) 8.6 MG TABS tablet, Take 1 tablet by mouth at bedtime., Disp: , Rfl:    Medications ordered in this encounter:  Meds ordered this encounter  Medications   erythromycin ophthalmic ointment    Sig: Place 1 Application into the left eye 4 (four) times daily for 5 days.    Dispense:  20 g    Refill:  0    Supervising Provider:   BLAISE ALEENE KIDD [  [id number]]     *If you need refills on other medications prior to your next appointment, please contact your pharmacy*  Follow-Up: Call back or seek an in-person evaluation if the symptoms worsen or if the condition fails to improve as anticipated.  [facility] [phone]  Other Instructions Use the eye ointment 4 times per day.  If no improvement follow up with PCP.    If you have been instructed to have an in-person evaluation today at a local Urgent Care facility, please use the link below. It will take you to a list of all of our available [facility], including address, phone number and hours of operation. Please do not delay care.  [facility]  If  you or a family member do not have a primary care provider, use the link below to schedule a visit and establish care. When you choose a [facility] primary care physician or advanced practice provider, you gain a long-term partner in health. Find a Primary Care Provider  Learn more about [facility]'s in-office and virtual care options: [facility] - Get Care Now

## 2024-06-17 NOTE — Progress Notes (Signed)
 Virtual Visit Consent   Samantha [name], you are scheduled for a virtual visit with a  provider today. Just as with appointments in the office, your consent must be obtained to participate. Your consent will be active for this visit and any virtual visit you may have with one of our providers in the next 365 days. If you have a MyChart account, a copy of this consent can be sent to you electronically.  As this is a virtual visit, video technology does not allow for your provider to perform a traditional examination. This may limit your provider's ability to fully assess your condition. If your provider identifies any concerns that need to be evaluated in person or the need to arrange testing (such as labs, EKG, etc.), we will make arrangements to do so. Although advances in technology are sophisticated, we cannot ensure that it will always work on either your end or our end. If the connection with a video visit is poor, the visit may have to be switched to a telephone visit. With either a video or telephone visit, we are not always able to ensure that we have a secure connection.  By engaging in this virtual visit, you consent to the provision of healthcare and authorize for your insurance to be billed (if applicable) for the services provided during this visit. Depending on your insurance coverage, you may receive a charge related to this service.  I need to obtain your verbal consent now. Are you willing to proceed with your visit today? Samantha [name] has provided verbal consent on 06/17/2024 for a virtual visit (video or telephone). Harlene PEDLAR Ward, PA-C  Date: 06/17/2024 10:52 AM   Virtual Visit via Video Note   I, Harlene PEDLAR Ward, connected with  Samantha [name]  ([id number], 01-10-1946) on 06/17/24 at 10:45 AM EDT by a video-enabled telemedicine application and verified that I am speaking with the correct person using two identifiers.  Location: Patient: Virtual Visit Location  Patient: Home Provider: Virtual Visit Location Provider: Home Office   I discussed the limitations of evaluation and management by telemedicine and the availability of in person appointments. The patient expressed understanding and agreed to proceed.    History of Present Illness: Samantha [name] is a 78 y.o. who identifies as a female who was assigned female at birth, and is being seen today for left eye redness and drainage that started today.  Upon waking had crusted eyelashes.  Pt does not wear contacts. No other sx, no congestion, rhinorrhea, fever, chills, sneezing, cough.  HPI: HPI  Problems:  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   Pancytopenia (HCC) 09/25/2023   History of CVA with residual deficit 09/25/2023   Heart failure with reduced ejection fraction (HCC) 09/25/2023   Well woman exam with routine gynecological exam 08/10/2023   ESRD (end stage renal disease) (HCC) 05/20/2023   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) 05/20/2023   Pressure injury of skin 04/06/2023   Fever 03/31/2023   Constipation 03/31/2023   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   Epigastric pain 12/13/2022   Failure to thrive in adult 12/13/2022   DM2 (diabetes mellitus, type 2) (HCC)  12/13/2022   Essential hypertension 12/13/2022   History of DVT (deep vein thrombosis) 12/13/2022   Acute cholecystitis 12/12/2022   Diabetic peripheral neuropathy (HCC) 07/12/2019   Dementia due to Alzheimer's disease (HCC) 07/12/2019    Allergies:  Allergies  Allergen Reactions   Asa [Aspirin] Anaphylaxis    Penicillins Anaphylaxis    **Tolerates cephalosporins   Medications:  Current Outpatient Medications:    erythromycin ophthalmic ointment, Place 1 Application into the left eye 4 (four) times daily for 5 days., Disp: 20 g, Rfl: 0   acetaminophen  (TYLENOL ) 500 MG tablet, Take 500 mg by mouth daily as needed for mild pain (pain score 1-3)., Disp: , Rfl:    apixaban  (ELIQUIS ) 2.5 MG TABS tablet, Take 1 tablet (2.5 mg total) by mouth 2 (two) times daily., Disp: 180 tablet, Rfl: 2   atorvastatin  (LIPITOR) 10 MG tablet, Take 1 tablet by mouth once daily, Disp: 90 tablet, Rfl: 0   carvedilol  (COREG ) 3.125 MG tablet, Take 3.125 mg Q AM  and 3.125 mg Q PM on non dialysis days (Tues, Thurs, Sat and Sun,  - On dialysis days,  MWF, continue Coreg  3.125 mg Q PM -  Hold for SBP < 100, Disp: 60 tablet, Rfl: 5   COVID-19 mRNA vaccine, Moderna, >/= 28yrs, (SPIKEVAX ) injection, Inject 0.5 mLs into the muscle as directed., Disp: 0.5 mL, Rfl: 1   Glucagon (BAQSIMI ONE PACK) 3 MG/DOSE POWD, Place 1 packet into the nose as needed (For blood sugar). Use when blood sugar drops below 70., Disp: , Rfl:    insulin  aspart (NOVOLOG ) 100 UNIT/ML injection, 0-9 Units, Subcutaneous, 3 times daily with meals, CBG < 70: Implement Hypoglycemia measures CBG 70 - 120: 0 units CBG 121 - 150: 1 unit CBG 151 - 200: 2 units CBG 201 - 250: 3 units CBG 251 - 300: 5 units CBG 301 - 350: 7 units CBG 351 - 400: 9 units CBG > 400: call MD, Disp: 10 mL, Rfl: 11   insulin  glargine (LANTUS ) 100 UNIT/ML Solostar Pen, Inject 18 Units into the skin at bedtime., Disp: , Rfl:    LOKELMA 10 g PACK packet, Take 1 packet by mouth as directed. Taken tues, thur, sat, Disp: , Rfl:    Methoxy PEG-Epoetin  Beta (MIRCERA IJ), 75 mcg as directed. Taken at dialysis, Disp: , Rfl:    Multiple Vitamins-Minerals (MULTIVITAMIN WITH MINERALS) tablet, Take 1 tablet by mouth at bedtime., Disp: , Rfl:    Nutritional Supplements (FEEDING SUPPLEMENT, OSMOLITE 1.5 CAL,)  LIQD, Place 600 mLs into feeding tube See admin instructions. 8p-6a, Disp: , Rfl:    polyethylene glycol powder (MIRALAX ) 17 GM/SCOOP powder, Take 17 g by mouth 2 (two) times daily as needed for moderate constipation or mild constipation., Disp: 255 g, Rfl: 2   senna (SENOKOT) 8.6 MG TABS tablet, Take 1 tablet by mouth at bedtime., Disp: , Rfl:   Observations/Objective: Patient is well-developed, well-nourished in no acute distress.  Resting comfortably at home.  Head is normocephalic, atraumatic.  No labored breathing.  Speech is clear and coherent with logical content.  Patient is alert and oriented at baseline.    Assessment and Plan: 1. Acute bacterial conjunctivitis of left eye (Primary)  Erythromycin ointment prescribed.   Follow Up Instructions: I discussed the assessment and treatment plan with the patient. The patient was provided an opportunity to ask questions and all were answered. The patient agreed with the plan and demonstrated an understanding of the instructions.  A copy of  instructions were sent to the patient via MyChart unless otherwise noted below.     The patient was advised to call back or seek an in-person evaluation if the symptoms worsen or if the condition fails to improve as anticipated.    Harlene PEDLAR Ward, PA-C

## 2024-06-22 ENCOUNTER — Encounter: Admitting: Orthopedic Surgery

## 2024-06-22 DIAGNOSIS — N186 End stage renal disease: Secondary | ICD-10-CM | POA: Diagnosis not present

## 2024-06-22 NOTE — Progress Notes (Signed)
 This encounter was created in error - please disregard.

## 2024-06-22 NOTE — Progress Notes (Signed)
 This service is provided via telemedicine  No vital signs collected/recorded due to the encounter was a telemedicine visit.   Location of patient (ex: home, work):  home  Patient consents to a telephone visit: yes  Location of the provider (ex: office, home):  [facility]   Name of any referring provider:  N/A  Names of all persons participating in the telemedicine service and their role in the encounter:  Brett Darko/ RMA, Greig Cluster, NP, and Patient.   Time spent on call:  11

## 2024-06-23 ENCOUNTER — Telehealth: Payer: Self-pay

## 2024-06-23 NOTE — Telephone Encounter (Signed)
 Copied from CRM [id number]. Topic: Clinical - Medical Advice >> Jun 23, 2024 12:44 PM Diannia H wrote: Reason for CRM: Patients daughter Jesusa) was calling to see if the patients tube can be replaced for some new ones. (Peg Tube) Could you call her back and assist with this callback number is [phone]. She has an appointment on Jun 29 2024 09:00 AM - IR MCIR1/WLIR1/GI315 IR [facility] INTERVENTIONAL RADIOLOGY - MC-IR 1

## 2024-06-23 NOTE — Telephone Encounter (Addendum)
 Contacted Patients daugther she stated that she will be following DR. V to her new location.  She also stated that she was unaware that Dr. LULLA was leaving and decided that she would like to keep her as her pcp. She is wanting to know is pts tube can be replaced. Routing to pts pcp for further review.

## 2024-06-28 ENCOUNTER — Other Ambulatory Visit: Payer: Self-pay

## 2024-06-29 ENCOUNTER — Other Ambulatory Visit (HOSPITAL_COMMUNITY): Payer: Self-pay | Admitting: Diagnostic Radiology

## 2024-06-29 ENCOUNTER — Ambulatory Visit (HOSPITAL_COMMUNITY)
Admission: RE | Admit: 2024-06-29 | Discharge: 2024-06-29 | Disposition: A | Discharge status: Home / Self Care | Source: Ambulatory Visit | Attending: Interventional Radiology | Admitting: Interventional Radiology

## 2024-06-29 DIAGNOSIS — Z4803 Encounter for change or removal of drains: Secondary | ICD-10-CM | POA: Diagnosis present

## 2024-06-29 DIAGNOSIS — K8020 Calculus of gallbladder without cholecystitis without obstruction: Secondary | ICD-10-CM | POA: Diagnosis not present

## 2024-06-29 HISTORY — PX: IR EXCHANGE BILIARY DRAIN: IMG6046

## 2024-06-29 MED ORDER — IOHEXOL 300 MG/ML  SOLN
50.0000 mL | Freq: Once | INTRAMUSCULAR | Status: AC | PRN
Start: 2024-06-29 — End: 2024-06-29
  Administered 2024-06-29: 15 mL

## 2024-06-29 MED ORDER — LIDOCAINE-EPINEPHRINE (PF) 1 %-1:200000 IJ SOLN
20.0000 mL | Freq: Once | INTRAMUSCULAR | Status: AC
  Administered 2024-06-29: 4 mL

## 2024-06-29 MED ORDER — LIDOCAINE-EPINEPHRINE 1 %-1:100000 IJ SOLN
INTRAMUSCULAR | Status: AC
Start: 2024-06-29 — End: 2024-06-29
  Filled 2024-06-29: qty 1

## 2024-06-29 NOTE — Procedures (Signed)
 Interventional Radiology Procedure:   Indications: Cholecystostomy tube   Procedure: Routine tube exchange  Findings: New 10 Fr tube in gallbladder.     Complications: None     EBL: Minimal  Plan:  Continue with routine tube exchanges   Marjarie Irion R. Philip, MD  Pager: [phone]

## 2024-07-06 ENCOUNTER — Encounter: Payer: Self-pay | Admitting: Orthopedic Surgery

## 2024-07-06 ENCOUNTER — Ambulatory Visit (INDEPENDENT_AMBULATORY_CARE_PROVIDER_SITE_OTHER): Admitting: Orthopedic Surgery

## 2024-07-06 DIAGNOSIS — Z992 Dependence on renal dialysis: Secondary | ICD-10-CM

## 2024-07-06 DIAGNOSIS — E1122 Type 2 diabetes mellitus with diabetic chronic kidney disease: Secondary | ICD-10-CM | POA: Diagnosis not present

## 2024-07-06 DIAGNOSIS — N186 End stage renal disease: Secondary | ICD-10-CM | POA: Diagnosis not present

## 2024-07-06 DIAGNOSIS — Z794 Long term (current) use of insulin: Secondary | ICD-10-CM

## 2024-07-06 DIAGNOSIS — Z0000 Encounter for general adult medical examination without abnormal findings: Secondary | ICD-10-CM

## 2024-07-06 NOTE — Progress Notes (Signed)
 Chief Complaint  Patient presents with   Medicare Wellness     Subjective:   Aleksis Code is a 78 y.o. female who presents for a Medicare Annual Wellness Visit.  Allergies (verified) Asa [aspirin] and Penicillins   History: Past Medical History:  Diagnosis Date   Cerebrovascular accident (CVA) due to occlusion of cerebral artery (HCC) 07/12/2019   CVA (cerebral vascular accident) (HCC)    residual left sided weakness   Dementia (HCC)    Diabetes (HCC)    Diabetes mellitus without complication (HCC)    ESRD (end stage renal disease) on dialysis (HCC)    Heart failure (HCC)    History of CT scan    History of heart attack    History of MRI    History of stroke    HTN (hypertension)    Kidney failure    PEG (percutaneous endoscopic gastrostomy) status (HCC)    Sepsis (HCC) 04/01/2023   SIRS (systemic inflammatory response syndrome) (HCC) 04/06/2023   Thyroid  disease    Past Surgical History:  Procedure Laterality Date   ABDOMINAL HYSTERECTOMY     GALLBLADDER SURGERY     IR EXCHANGE BILIARY DRAIN  02/11/2023   IR EXCHANGE BILIARY DRAIN  04/13/2023   IR EXCHANGE BILIARY DRAIN  06/15/2023   IR EXCHANGE BILIARY DRAIN  08/23/2023   IR EXCHANGE BILIARY DRAIN  10/05/2023   IR EXCHANGE BILIARY DRAIN  11/30/2023   IR EXCHANGE BILIARY DRAIN  01/25/2024   IR EXCHANGE BILIARY DRAIN  03/21/2024   IR EXCHANGE BILIARY DRAIN  05/04/2024   IR EXCHANGE BILIARY DRAIN  06/29/2024   IR PERC CHOLECYSTOSTOMY  12/16/2022   IR REPLACE G-TUBE SIMPLE WO FLUORO  08/23/2023   IR REPLC GASTRO/COLONIC TUBE PERCUT W/FLUORO  06/15/2023   IR REPLC GASTRO/COLONIC TUBE PERCUT W/FLUORO  10/05/2023   IR REPLC GASTRO/COLONIC TUBE PERCUT W/FLUORO  11/30/2023   IR REPLC GASTRO/COLONIC TUBE PERCUT W/FLUORO  03/21/2024   Family History  Problem Relation Age of Onset   Diabetes Mother    Social History   Occupational History   Not on file  Tobacco Use   Smoking status: Never   Smokeless tobacco: Not on  file  Vaping Use   Vaping status: Never Used  Substance and Sexual Activity   Alcohol use: Not Currently   Drug use: Never   Sexual activity: Not Currently   Tobacco Counseling Counseling given: Not Answered  SDOH Screenings   Food Insecurity: No Food Insecurity (12/07/2023)  Housing: Low Risk  (12/07/2023)  Transportation Needs: No Transportation Needs (12/07/2023)  Utilities: Not At Risk (12/07/2023)  Depression (PHQ2-9): Low Risk  (07/06/2024)  Financial Resource Strain: Low Risk  (06/20/2023)  Physical Activity: Unknown (06/20/2023)  Social Connections: Socially Isolated (12/07/2023)  Stress: No Stress Concern Present (06/20/2023)  Tobacco Use: Unknown (07/06/2024)  Health Literacy: Low Risk (01/23/2020)   Received from [facility]   See flowsheets for full screening details  Depression Screen PHQ 2 & 9 Depression Scale- Over the past 2 weeks, how often have you been bothered by any of the following problems? Little interest or pleasure in doing things: 0 Feeling down, depressed, or hopeless (PHQ Adolescent also includes...irritable): 0 PHQ-2 Total Score: 0     Goals Addressed   None    Visit info / Clinical Intake: Medicare Wellness Visit Type:: Welcome to [employer] (IPPE) Persons participating in visit:: patient & caregiver Medicare Wellness Visit Mode:: Video Interpreter Needed?: No Pre-visit prep was completed: no AWV questionnaire completed  by patient prior to visit?: no Living arrangements:: with family/others Patient's Overall Health Status Rating: good Typical amount of pain: some Does pain affect daily life?: (!) yes Are you currently prescribed opioids?: no  Dietary Habits and Nutritional Risks How many meals a day?: 3 Eats fruit and vegetables daily?: yes Most meals are obtained by: having others provide food In the last 2 weeks, have you had any of the following?: none Diabetic:: (!) yes Any non-healing wounds?: no How often do you check  your BS?: 3 Would you like to be referred to a Nutritionist or for Diabetic Management? : yes  Fall Screening Falls in the past year?: 0 Number of falls in past year: 0 Was there an injury with Fall?: 0 Fall Risk Category Calculator: 0 Patient Fall Risk Level: Low Fall Risk  Fall Risk Patient at Risk for Falls Due to: Impaired balance/gait; Impaired mobility Fall risk Follow up: Falls evaluation completed  Cognitive Assessment Difficulty concentrating, remembering, or making decisions? : yes Will 6CIT or Mini Cog be Completed: yes What year is it?: 0 points What month is it?: 0 points Give patient an address phrase to remember (5 components): [address] [place] About what time is it?: 0 points Count backwards from 20 to 1: 4 points Say the months of the year in reverse: 4 points Repeat the address phrase from earlier: 8 points 6 CIT Score: 16 points  Advance Directives (For Healthcare) Does Patient Have a Medical Advance Directive?: Yes Does patient want to make changes to medical advance directive?: No - Patient declined Type of Advance Directive: Living will Copy of Living Will in Chart?: No - copy requested Out of facility DNR (pink MOST or yellow form) in Chart? (Ambulatory ONLY): No - copy requested Would patient like information on creating a medical advance directive?: No - Patient declined        Objective:    There were no vitals filed for this visit. There is no height or weight on file to calculate BMI.  Current Medications (verified) Outpatient Encounter Medications as of 07/06/2024  Medication Sig   acetaminophen  (TYLENOL ) 500 MG tablet Take 500 mg by mouth daily as needed for mild pain (pain score 1-3).   apixaban  (ELIQUIS ) 2.5 MG TABS tablet Take 1 tablet (2.5 mg total) by mouth 2 (two) times daily.   atorvastatin  (LIPITOR) 10 MG tablet Take 1 tablet by mouth once daily   carvedilol  (COREG ) 3.125 MG tablet Take 3.125 mg Q AM  and 3.125 mg Q  PM on non dialysis days (Tues, Thurs, Sat and Sun,  - On dialysis days,  MWF, continue Coreg  3.125 mg Q PM -  Hold for SBP < 100   COVID-19 mRNA vaccine, Moderna, >/= 35yrs, (SPIKEVAX ) injection Inject 0.5 mLs into the muscle as directed.   Glucagon (BAQSIMI ONE PACK) 3 MG/DOSE POWD Place 1 packet into the nose as needed (For blood sugar). Use when blood sugar drops below 70.   insulin  aspart (NOVOLOG ) 100 UNIT/ML injection 0-9 Units, Subcutaneous, 3 times daily with meals, CBG < 70: Implement Hypoglycemia measures CBG 70 - 120: 0 units CBG 121 - 150: 1 unit CBG 151 - 200: 2 units CBG 201 - 250: 3 units CBG 251 - 300: 5 units CBG 301 - 350: 7 units CBG 351 - 400: 9 units CBG > 400: call MD   insulin  glargine (LANTUS ) 100 UNIT/ML Solostar Pen Inject 18 Units into the skin at bedtime.   LOKELMA 10 g  PACK packet Take 1 packet by mouth as directed. Taken tues, thur, sat   Methoxy PEG-Epoetin  Beta (MIRCERA IJ) 75 mcg as directed. Taken at dialysis   Multiple Vitamins-Minerals (MULTIVITAMIN WITH MINERALS) tablet Take 1 tablet by mouth at bedtime.   polyethylene glycol powder (MIRALAX ) 17 GM/SCOOP powder Take 17 g by mouth 2 (two) times daily as needed for moderate constipation or mild constipation.   senna (SENOKOT) 8.6 MG TABS tablet Take 1 tablet by mouth at bedtime.   simethicone (MYLICON) 125 MG chewable tablet Chew 125 mg by mouth 2 (two) times daily after a meal.   Nutritional Supplements (FEEDING SUPPLEMENT, OSMOLITE 1.5 CAL,) LIQD Place 600 mLs into feeding tube See admin instructions. 8p-6a (Patient not taking: Reported on 07/06/2024)   No facility-administered encounter medications on file as of 07/06/2024.   Hearing/Vision screen Hearing Screening - Comments:: No hearing issues Immunizations and Health Maintenance Health Maintenance  Topic Date Due   Medicare Annual Wellness (AWV)  Never done   COVID-19 Vaccine (1) Never done   OPHTHALMOLOGY EXAM  Never done   Hepatitis C Screening  Never  done   DTaP/Tdap/Td (1 - Tdap) Never done   Zoster Vaccines- Shingrix (1 of 2) Never done   DEXA SCAN  Never done   HEMOGLOBIN A1C  07/15/2024   FOOT EXAM  05/02/2025   Pneumococcal Vaccine: 50+ Years  Completed   Influenza Vaccine  Completed   Meningococcal B Vaccine  Aged Out        Assessment/Plan:  This is a routine wellness examination for Lyriq.  Patient Care Team: Sherlynn Madden, MD as PCP - General (Internal Medicine)  I have personally reviewed and noted the following in the patient's chart:   Medical and social history Use of alcohol, tobacco or illicit drugs  Current medications and supplements including opioid prescriptions. Functional ability and status Nutritional status Physical activity Advanced directives List of other physicians Hospitalizations, surgeries, and ER visits in previous 12 months Vitals Screenings to include cognitive, depression, and falls Referrals and appointments  No orders of the defined types were placed in this encounter.  In addition, I have reviewed and discussed with patient certain preventive protocols, quality metrics, and best practice recommendations. A written personalized care plan for preventive services as well as general preventive health recommendations were provided to patient.   Greig FORBES Cluster, NP   07/06/2024   No follow-ups on file.  Video visit.   I connected with Leyton Venturi by video visit and verified that I am speaking with the correct person using two identifiers.  Location: [facility] Patient: Gordon Kipnis Provider: Greig FORBES Cluster, NP    I discussed the limitations, risks, security and privacy concerns of performing an evaluation and management service by telephone and the availability of in person appointments. I also discussed with the patient that there may be a patient responsible charge related to this service. The patient expressed understanding and agreed to proceed.   I  discussed the assessment and treatment plan with the patient. The patient was provided an opportunity to ask questions and all were answered. The patient agreed with the plan and demonstrated an understanding of the instructions.   The patient was advised to call back or seek an in-person evaluation if the symptoms worsen or if the condition fails to improve as anticipated.  I provided 14 minutes of face-to-face time during this encounter.  Monti Villers Cluster, AGNP Avs printed and mailed   After Visit Summary: (MyChart) Due  to this being a telephonic visit, the after visit summary with patients personalized plan was offered to patient via MyChart (video visit)  Nurse Notes: patient nonverbal during encounter. Daughter/caregiver present. Referral made for diabetic eye exam. Patient nonambulatory. Do not recommend future DEXA/ bone density. Discussed need for Tdap and covid vaccines.

## 2024-07-06 NOTE — Progress Notes (Signed)
 This service is provided via telemedicine  No vital signs collected/recorded due to the encounter was a telemedicine visit.   Location of patient (ex: home, work):  home  Patient consents to a telephone visit: yes  Location of the provider (ex: office, home):  [facility]   Name of any referring provider:  N/A  Names of all persons participating in the telemedicine service and their role in the encounter:  Brett Darko/ RMA, Greig Cluster, NP, and Patient.   Time spent on call:  11

## 2024-07-12 LAB — HEMOGLOBIN A1C: Hemoglobin A1C: 6.6

## 2024-07-12 LAB — BASIC METABOLIC PANEL WITH GFR
CO2: 27 — AB (ref 13–22)
Chloride: 92 — AB (ref 99–108)
Creatinine: 3.6 — AB (ref 0.5–1.1)
Glucose: 190
Potassium: 5.3 meq/L — AB (ref 3.5–5.1)
Sodium: 133 — AB (ref 137–147)

## 2024-07-12 LAB — CBC: RBC: 3.45 — AB (ref 3.87–5.11)

## 2024-07-12 LAB — CBC AND DIFFERENTIAL
HCT: 32 — AB (ref 36–46)
Hemoglobin: 10.7 — AB (ref 12.0–16.0)
Neutrophils Absolute: 4133
WBC: 7.2

## 2024-07-12 LAB — COMPREHENSIVE METABOLIC PANEL WITH GFR
Albumin: 3.8 (ref 3.5–5.0)
Calcium: 9 (ref 8.7–10.7)

## 2024-07-12 LAB — HEPATIC FUNCTION PANEL: Alkaline Phosphatase: 138 — AB (ref 25–125)

## 2024-07-12 LAB — IRON,TIBC AND FERRITIN PANEL
%SAT: 56
Iron: 127

## 2024-07-12 LAB — HEPATITIS B SURFACE ANTIGEN: Hepatitis B Surface Ag: NONREACTIVE

## 2024-07-18 ENCOUNTER — Telehealth: Payer: Self-pay

## 2024-07-18 NOTE — Telephone Encounter (Signed)
 Called pt ad spoke with her daughter. Pt will need an OV before referral can be placed. Pt scheduled   Copied from CRM #[id number]. Topic: Referral - Request for Referral >> Jul 18, 2024 10:36 AM Aleatha BROCKS wrote: Did the patient discuss referral with their provider in the last year? Yes (If No - schedule appointment) (If Yes - send message)  Appointment offered? Yes  Type of order/referral and detailed reason for visit:   Interventional Radiology for Peg Tube  Preference of office, provider, location: [facility] Interventional Radiology     If referral order, have you been seen by this specialty before? Yes (If Yes, this issue or another issue? When? Where?  Can we respond through MyChart? No

## 2024-07-19 ENCOUNTER — Telehealth: Payer: Self-pay

## 2024-07-19 NOTE — Telephone Encounter (Signed)
 Routing to covering provider.

## 2024-07-19 NOTE — Telephone Encounter (Signed)
 Copied from CRM #[id number]. Topic: Referral - Request for Referral >> Jul 18, 2024 10:36 AM Aleatha BROCKS wrote: Did the patient discuss referral with their provider in the last year? Yes (If No - schedule appointment) (If Yes - send message)  Appointment offered? Yes  Type of order/referral and detailed reason for visit:   Interventional Radiology for Peg Tube  Preference of office, provider, location: [facility] Interventional Radiology     If referral order, have you been seen by this specialty before? Yes (If Yes, this issue or another issue? When? Where?  Can we respond through MyChart? No >> Jul 19, 2024  8:45 AM Marda MATSU wrote: Samantha [name] called regarding pt Samantha [name]'s peg tube referral.  She was informed that patient Samantha [name] would need an appointment. Samantha [name] got upset and said she will take her to the ER and hung up.

## 2024-07-25 ENCOUNTER — Ambulatory Visit: Admitting: Sports Medicine

## 2024-07-25 ENCOUNTER — Other Ambulatory Visit (HOSPITAL_COMMUNITY): Payer: Self-pay | Admitting: Sports Medicine

## 2024-07-25 ENCOUNTER — Encounter: Payer: Self-pay | Admitting: Sports Medicine

## 2024-07-25 VITALS — BP 129/80 | HR 99 | Temp 98.7°F

## 2024-07-25 DIAGNOSIS — E1122 Type 2 diabetes mellitus with diabetic chronic kidney disease: Secondary | ICD-10-CM | POA: Diagnosis not present

## 2024-07-25 DIAGNOSIS — Z992 Dependence on renal dialysis: Secondary | ICD-10-CM | POA: Diagnosis not present

## 2024-07-25 DIAGNOSIS — Z794 Long term (current) use of insulin: Secondary | ICD-10-CM

## 2024-07-25 DIAGNOSIS — Z931 Gastrostomy status: Secondary | ICD-10-CM | POA: Diagnosis not present

## 2024-07-25 DIAGNOSIS — N186 End stage renal disease: Secondary | ICD-10-CM

## 2024-07-25 DIAGNOSIS — Z8673 Personal history of transient ischemic attack (TIA), and cerebral infarction without residual deficits: Secondary | ICD-10-CM | POA: Diagnosis not present

## 2024-07-25 DIAGNOSIS — I6350 Cerebral infarction due to unspecified occlusion or stenosis of unspecified cerebral artery: Secondary | ICD-10-CM

## 2024-07-25 LAB — POCT GLYCOSYLATED HEMOGLOBIN (HGB A1C)
HbA1c POC (<> result, manual entry): 7.1 % (ref 4.0–5.6)
HbA1c, POC (controlled diabetic range): 7.1 % — AB (ref 0.0–7.0)
HbA1c, POC (prediabetic range): 7.1 % — AB (ref 5.7–6.4)
Hemoglobin A1C: 7.1 % — AB (ref 4.0–5.6)

## 2024-07-25 NOTE — Patient Instructions (Signed)
 If labs were collected or images ordered, we will inform you of the results once we have received and reviewed them. We will contact you either by fpl group, or telephone call. If a referral to a specialist was entered for you, please call us  in 2 weeks if you have not heard from the specialist office to schedule.   Thank you for choosing us  for your care. Wishing you a healthy and peaceful winter season. Stay well and we look forward to seeing you at your next visit.

## 2024-07-25 NOTE — Assessment & Plan Note (Addendum)
 A1c at goal  Cont with lantus  , sliding scale Monitor for hypoglycemia Will refer to ophthalmology Orders:   Ambulatory referral to Ophthalmology   POCT glycosylated hemoglobin (Hb A1C)

## 2024-07-25 NOTE — Progress Notes (Signed)
 Careteam: Patient Care Team: Sherlynn Madden, MD as PCP - General (Internal Medicine)  Allergies  Allergen Reactions   Dorethia [Aspirin] Anaphylaxis   Penicillins Anaphylaxis    **Tolerates cephalosporins    Chief Complaint  Patient presents with   Establish Care    Transfer of care. Pt needs a referral to have her peg tub changed. Pt wants to make sure     Discussed the use of AI scribe software for clinical note transcription with the patient, who gave verbal consent to proceed.  History of Present Illness    Samantha [name] is a 78 year old female with a PEG tube who presents with concerns about the tube's functionality. She is accompanied by her daughter, Samantha [name], who is her primary caregiver.  The PEG tube has become limp and is not functioning as expected, raising concerns about potential issues with flushing and feeding. The last replacement occurred approximately six months ago, and she has had the tube since 2019. Although there have been no issues with flow or blockage yet, there is concern that it may start soon if not addressed.  She has a history of dementia, which sometimes results in her reluctance to get up in the morning, particularly for dialysis. However, she does not exhibit any aggressive behavior.  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 She also has a history of a left-sided stroke  She has two scabby wounds on her bottom, which are tender but not open, managed with foam dressing and diaper rash cream as needed.  Her diabetes is managed with insulin , taking 18 units of long-acting insulin  in the evening and using a Novolog  sliding scale. Her A1c is currently 7.1. She also takes Coreg  3.125 mg twice daily and Lipitor 10 mg daily.  She attends dialysis on Monday, Wednesday, and Friday, with recent hemoglobin levels between 10.2 to 10.4.    She has minimal oral intake due to the PEG tube but does not  experience coughing or choking while eating. She has no issues with urination or bowel movements.  Review of Systems:  Review of Systems  Constitutional:  Negative for chills and fever.  HENT:  Negative for congestion and sore throat.   Respiratory:  Negative for cough, sputum production and shortness of breath.   Cardiovascular:  Negative for chest pain, palpitations and leg swelling.  Gastrointestinal:  Negative for abdominal pain, heartburn and nausea.  Genitourinary:  Negative for dysuria.  Musculoskeletal:  Negative for falls.  Neurological:  Negative for dizziness.   Negative unless indicated in HPI.   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   Pancytopenia (HCC) 09/25/2023   History of CVA with residual deficit 09/25/2023   Heart failure with reduced ejection fraction (HCC) 09/25/2023   Well woman exam with routine gynecological exam 08/10/2023   ESRD (end stage renal disease) (HCC) 05/20/2023   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) 05/20/2023   Pressure injury of skin 04/06/2023   Fever 03/31/2023   Constipation 03/31/2023   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   Epigastric pain 12/13/2022   Failure to thrive in  adult 12/13/2022   DM2 (diabetes mellitus, type 2) (HCC) 12/13/2022   Essential hypertension 12/13/2022   History of DVT (deep vein thrombosis) 12/13/2022   Acute cholecystitis 12/12/2022   Diabetic peripheral neuropathy (HCC) 07/12/2019   Dementia due to Alzheimer's disease (HCC) 07/12/2019   Past Medical History:  Diagnosis Date   Allergy    Anemia     Arthritis    Cerebrovascular accident (CVA) due to occlusion of cerebral artery (HCC) 07/12/2019   CHF (congestive heart failure) (HCC)    CVA (cerebral vascular accident) (HCC)    residual left sided weakness   Dementia (HCC)    Diabetes (HCC)    Diabetes mellitus without complication (HCC)    ESRD (end stage renal disease) on dialysis (HCC)    Heart failure (HCC)    History of CT scan    History of heart attack    History of MRI    History of stroke    HTN (hypertension)    Kidney failure    Osteoporosis    PEG (percutaneous endoscopic gastrostomy) status (HCC)    Sepsis (HCC) 04/01/2023   SIRS (systemic inflammatory response syndrome) (HCC) 04/06/2023   Thyroid  disease    Past Surgical History:  Procedure Laterality Date   ABDOMINAL HYSTERECTOMY     GALLBLADDER SURGERY     IR EXCHANGE BILIARY DRAIN  02/11/2023   IR EXCHANGE BILIARY DRAIN  04/13/2023   IR EXCHANGE BILIARY DRAIN  06/15/2023   IR EXCHANGE BILIARY DRAIN  08/23/2023   IR EXCHANGE BILIARY DRAIN  10/05/2023   IR EXCHANGE BILIARY DRAIN  11/30/2023   IR EXCHANGE BILIARY DRAIN  01/25/2024   IR EXCHANGE BILIARY DRAIN  03/21/2024   IR EXCHANGE BILIARY DRAIN  05/04/2024   IR EXCHANGE BILIARY DRAIN  06/29/2024   IR PERC CHOLECYSTOSTOMY  12/16/2022   IR REPLACE G-TUBE SIMPLE WO FLUORO  08/23/2023   IR REPLC GASTRO/COLONIC TUBE PERCUT W/FLUORO  06/15/2023   IR REPLC GASTRO/COLONIC TUBE PERCUT W/FLUORO  10/05/2023   IR REPLC GASTRO/COLONIC TUBE PERCUT W/FLUORO  11/30/2023   IR REPLC GASTRO/COLONIC TUBE PERCUT W/FLUORO  03/21/2024   Social History   Tobacco Use   Smoking status: Never  Vaping Use   Vaping status: Never Used  Substance Use Topics   Alcohol use: Not Currently   Drug use: Never   Family History  Problem Relation Age of Onset   Diabetes Mother    Allergies  Allergen Reactions   Asa [Aspirin] Anaphylaxis   Penicillins Anaphylaxis    **Tolerates cephalosporins    Medications: Patient's Medications   New Prescriptions   No medications on file  Previous Medications   ACETAMINOPHEN  (TYLENOL ) 500 MG TABLET    Take 500 mg by mouth daily as needed for mild pain (pain score 1-3).   APIXABAN  (ELIQUIS ) 2.5 MG TABS TABLET    Take 1 tablet (2.5 mg total) by mouth 2 (two) times daily.   ATORVASTATIN  (LIPITOR) 10 MG TABLET    Take 1 tablet by mouth once daily   CARVEDILOL  (COREG ) 3.125 MG TABLET    Take 3.125 mg Q AM  and 3.125 mg Q PM on non dialysis days (Tues, Thurs, Sat and Sun,  - On dialysis days,  MWF, continue Coreg  3.125 mg Q PM -  Hold for SBP < 100   COVID-19 MRNA VACCINE, MODERNA, >/= 27YRS, (SPIKEVAX ) INJECTION    Inject 0.5 mLs into the muscle as directed.   GLUCAGON (BAQSIMI ONE PACK) 3 MG/DOSE POWD  Place 1 packet into the nose as needed (For blood sugar). Use when blood sugar drops below 70.   INSULIN  ASPART (NOVOLOG ) 100 UNIT/ML INJECTION    0-9 Units, Subcutaneous, 3 times daily with meals, CBG < 70: Implement Hypoglycemia measures CBG 70 - 120: 0 units CBG 121 - 150: 1 unit CBG 151 - 200: 2 units CBG 201 - 250: 3 units CBG 251 - 300: 5 units CBG 301 - 350: 7 units CBG 351 - 400: 9 units CBG > 400: call MD   INSULIN  GLARGINE (LANTUS ) 100 UNIT/ML SOLOSTAR PEN    Inject 18 Units into the skin at bedtime.   LOKELMA 10 G PACK PACKET    Take 1 packet by mouth as directed. Taken tues, thur, sat   METHOXY PEG-EPOETIN  BETA (MIRCERA IJ)    75 mcg as directed. Taken at dialysis   MULTIPLE VITAMINS-MINERALS (MULTIVITAMIN WITH MINERALS) TABLET    Take 1 tablet by mouth at bedtime.   NUTRITIONAL SUPPLEMENTS (FEEDING SUPPLEMENT, OSMOLITE 1.5 CAL,) LIQD    Place 600 mLs into feeding tube See admin instructions. 8p-6a   POLYETHYLENE GLYCOL POWDER (MIRALAX ) 17 GM/SCOOP POWDER    Take 17 g by mouth 2 (two) times daily as needed for moderate constipation or mild constipation.   SENNA (SENOKOT) 8.6 MG TABS TABLET    Take 1 tablet by mouth at bedtime.   SIMETHICONE (MYLICON) 125 MG CHEWABLE TABLET    Chew  125 mg by mouth 2 (two) times daily after a meal.  Modified Medications   No medications on file  Discontinued Medications   No medications on file    Physical Exam: There were no vitals filed for this visit. There is no height or weight on file to calculate BMI. BP Readings from Last 3 Encounters:  05/02/24 120/78  04/13/24 125/80  03/06/24 (!) 141/64   Wt Readings from Last 3 Encounters:  04/13/24 130 lb (59 kg)  03/05/24 128 lb (58.1 kg)  01/18/24 125 lb (56.7 kg)    Physical Exam Constitutional:      Appearance: Normal appearance.  HENT:     Head: Normocephalic and atraumatic.  Cardiovascular:     Rate and Rhythm: Normal rate and regular rhythm.  Pulmonary:     Effort: Pulmonary effort is normal. No respiratory distress.     Breath sounds: Normal breath sounds. No wheezing.  Abdominal:     General: Bowel sounds are normal. There is no distension.     Tenderness: There is no abdominal tenderness. There is no guarding or rebound.     Comments:    Musculoskeletal:        General: No swelling.  Skin:    General: Skin is dry.  Neurological:     Mental Status: She is alert. Mental status is at baseline.     Comments: Residual weakness on left side       Labs reviewed: Basic Metabolic Panel: Recent Labs    09/27/23 0406 09/27/23 0407 12/07/23 0409 12/08/23 0357 12/09/23 0335 03/05/24 1939  NA  --    < > 135 131* 133* 132*  K  --    < > 5.5* 4.2 4.0 5.2*  CL  --    < > 97* 95* 94* 93*  CO2  --    < > 25 27 25 26   GLUCOSE  --    < > 114* 278* 276* 94  BUN  --    < > 31* 13 10 28*  CREATININE  --    < >  3.92* 2.48* 2.21* 3.25*  CALCIUM   --    < > 9.1 8.9 8.7* 9.3  MG 2.1  --   --   --   --   --   PHOS  --    < > 3.9 3.4 3.0  --   TSH  --   --   --  3.990  --   --    < > = values in this interval not displayed.   Liver Function Tests: Recent Labs    12/06/23 0730 12/07/23 0406 12/07/23 0409 12/08/23 0357 12/09/23 0335 03/05/24 1939  AST 30 33  --    --   --  25  ALT 28 29  --   --   --  15  ALKPHOS 159* 146*  --   --   --  232*  BILITOT 0.6 0.5  --   --   --  0.9  PROT 7.1 6.9  --   --   --  6.7  ALBUMIN 3.1* 3.1*   < > 2.7* 2.7* 3.5   < > = values in this interval not displayed.   Recent Labs    09/01/23 1954 09/25/23 0206  LIPASE 55* 60*   No results for input(s): AMMONIA in the last 8760 hours. CBC: Recent Labs    12/06/23 0701 12/07/23 0408 12/08/23 0357 12/09/23 0335 03/05/24 1939  WBC 9.0   < > 6.2 5.3 6.9  NEUTROABS 5.4  --  3.7  --  3.5  HGB 10.7*   < > 8.6* 8.7* 10.9*  HCT 32.7*   < > 26.6* 26.7* 33.8*  MCV 92.4   < > 90.5 91.1 93.4  PLT 149*   < > 138* 148* 140*   < > = values in this interval not displayed.   Lipid Panel: No results for input(s): CHOL, HDL, LDLCALC, TRIG, CHOLHDL, LDLDIRECT in the last 8760 hours. TSH: Recent Labs    12/08/23 0357  TSH 3.990   A1C: Lab Results  Component Value Date   HGBA1C 6.6 (H) 09/25/2023    Assessment & Plan Type 2 diabetes mellitus with chronic kidney disease on chronic dialysis, with long-term current use of insulin  (HCC) A1c at goal  Cont with lantus  , sliding scale Monitor for hypoglycemia Will refer to ophthalmology Orders:   Ambulatory referral to Ophthalmology   POCT glycosylated hemoglobin (Hb A1C)  S/P percutaneous endoscopic gastrostomy (PEG) tube placement [facility])  Orders:   Ambulatory referral to Interventional Radiology  Cerebrovascular accident (CVA) due to occlusion of cerebral artery (HCC) Pt is bed bound Needs assistance with ALDS Cont with lipitor    End-stage renal disease on hemodialysis (HCC) Follow up with nephrology     40 min total time spent for obtaining history,  performing a medically appropriate examination and evaluation, reviewing the tests,   documenting clinical information in the electronic or other health record, independently interpreting results ,care coordination (not separately reported)     No follow-ups on file.: 4 months  Rosanne Wohlfarth

## 2024-08-03 ENCOUNTER — Ambulatory Visit (HOSPITAL_COMMUNITY): Admission: RE | Admit: 2024-08-03 | Discharge: 2024-08-03 | Attending: Sports Medicine

## 2024-08-03 ENCOUNTER — Ambulatory Visit: Payer: Self-pay | Admitting: Orthopedic Surgery

## 2024-08-03 ENCOUNTER — Other Ambulatory Visit (HOSPITAL_COMMUNITY): Payer: Self-pay | Admitting: Diagnostic Radiology

## 2024-08-03 DIAGNOSIS — Z931 Gastrostomy status: Secondary | ICD-10-CM

## 2024-08-03 DIAGNOSIS — K8020 Calculus of gallbladder without cholecystitis without obstruction: Secondary | ICD-10-CM

## 2024-08-03 DIAGNOSIS — Z434 Encounter for attention to other artificial openings of digestive tract: Secondary | ICD-10-CM | POA: Diagnosis not present

## 2024-08-03 DIAGNOSIS — Z431 Encounter for attention to gastrostomy: Secondary | ICD-10-CM | POA: Diagnosis present

## 2024-08-03 HISTORY — PX: IR REPLC GASTRO/COLONIC TUBE PERCUT W/FLUORO: IMG2333

## 2024-08-03 HISTORY — PX: IR EXCHANGE BILIARY DRAIN: IMG6046

## 2024-08-03 MED ORDER — LIDOCAINE-EPINEPHRINE 1 %-1:100000 IJ SOLN
20.0000 mL | Freq: Once | INTRAMUSCULAR | Status: AC
  Administered 2024-08-03: 10 mL via INTRADERMAL

## 2024-08-03 MED ORDER — LIDOCAINE VISCOUS HCL 2 % MT SOLN
OROMUCOSAL | Status: AC
  Filled 2024-08-03: qty 15

## 2024-08-03 MED ORDER — LIDOCAINE-EPINEPHRINE 1 %-1:100000 IJ SOLN
INTRAMUSCULAR | Status: AC
  Filled 2024-08-03: qty 1

## 2024-08-03 MED ORDER — IOHEXOL 300 MG/ML  SOLN
50.0000 mL | Freq: Once | INTRAMUSCULAR | Status: AC | PRN
  Administered 2024-08-03: 20 mL

## 2024-08-03 MED ORDER — LIDOCAINE VISCOUS HCL 2 % MT SOLN
15.0000 mL | Freq: Once | OROMUCOSAL | Status: AC
  Administered 2024-08-03: 15 mL via OROMUCOSAL

## 2024-08-10 ENCOUNTER — Other Ambulatory Visit: Admitting: Radiology

## 2024-08-12 ENCOUNTER — Other Ambulatory Visit: Payer: Self-pay | Admitting: Sports Medicine

## 2024-08-18 ENCOUNTER — Emergency Department (HOSPITAL_COMMUNITY)

## 2024-08-18 ENCOUNTER — Encounter (HOSPITAL_COMMUNITY): Payer: Self-pay

## 2024-08-18 ENCOUNTER — Observation Stay (HOSPITAL_COMMUNITY)
Admission: EM | Admit: 2024-08-18 | Discharge: 2024-08-20 | Disposition: A | Attending: Emergency Medicine | Admitting: Emergency Medicine

## 2024-08-18 ENCOUNTER — Other Ambulatory Visit: Payer: Self-pay

## 2024-08-18 DIAGNOSIS — G9340 Encephalopathy, unspecified: Secondary | ICD-10-CM | POA: Insufficient documentation

## 2024-08-18 DIAGNOSIS — Z1152 Encounter for screening for COVID-19: Secondary | ICD-10-CM | POA: Insufficient documentation

## 2024-08-18 DIAGNOSIS — E785 Hyperlipidemia, unspecified: Secondary | ICD-10-CM | POA: Insufficient documentation

## 2024-08-18 DIAGNOSIS — Z7901 Long term (current) use of anticoagulants: Secondary | ICD-10-CM | POA: Insufficient documentation

## 2024-08-18 DIAGNOSIS — I480 Paroxysmal atrial fibrillation: Secondary | ICD-10-CM | POA: Diagnosis not present

## 2024-08-18 DIAGNOSIS — F0390 Unspecified dementia without behavioral disturbance: Secondary | ICD-10-CM | POA: Insufficient documentation

## 2024-08-18 DIAGNOSIS — R1310 Dysphagia, unspecified: Secondary | ICD-10-CM | POA: Insufficient documentation

## 2024-08-18 DIAGNOSIS — Z992 Dependence on renal dialysis: Secondary | ICD-10-CM | POA: Diagnosis not present

## 2024-08-18 DIAGNOSIS — Z79899 Other long term (current) drug therapy: Secondary | ICD-10-CM | POA: Diagnosis not present

## 2024-08-18 DIAGNOSIS — I69354 Hemiplegia and hemiparesis following cerebral infarction affecting left non-dominant side: Secondary | ICD-10-CM | POA: Diagnosis not present

## 2024-08-18 DIAGNOSIS — E1165 Type 2 diabetes mellitus with hyperglycemia: Secondary | ICD-10-CM | POA: Diagnosis not present

## 2024-08-18 DIAGNOSIS — I132 Hypertensive heart and chronic kidney disease with heart failure and with stage 5 chronic kidney disease, or end stage renal disease: Secondary | ICD-10-CM | POA: Diagnosis not present

## 2024-08-18 DIAGNOSIS — R55 Syncope and collapse: Principal | ICD-10-CM

## 2024-08-18 DIAGNOSIS — I5042 Chronic combined systolic (congestive) and diastolic (congestive) heart failure: Secondary | ICD-10-CM | POA: Insufficient documentation

## 2024-08-18 DIAGNOSIS — Z86718 Personal history of other venous thrombosis and embolism: Secondary | ICD-10-CM | POA: Insufficient documentation

## 2024-08-18 DIAGNOSIS — I6930 Unspecified sequelae of cerebral infarction: Secondary | ICD-10-CM

## 2024-08-18 DIAGNOSIS — Z794 Long term (current) use of insulin: Secondary | ICD-10-CM | POA: Insufficient documentation

## 2024-08-18 DIAGNOSIS — E1122 Type 2 diabetes mellitus with diabetic chronic kidney disease: Secondary | ICD-10-CM | POA: Insufficient documentation

## 2024-08-18 DIAGNOSIS — N186 End stage renal disease: Secondary | ICD-10-CM | POA: Diagnosis not present

## 2024-08-18 DIAGNOSIS — R9431 Abnormal electrocardiogram [ECG] [EKG]: Secondary | ICD-10-CM

## 2024-08-18 LAB — COMPREHENSIVE METABOLIC PANEL WITH GFR
ALT: 16 U/L (ref 0–44)
AST: 39 U/L (ref 15–41)
Albumin: 4.1 g/dL (ref 3.5–5.0)
Alkaline Phosphatase: 197 U/L — ABNORMAL HIGH (ref 38–126)
Anion gap: 13 (ref 5–15)
BUN: 9 mg/dL (ref 8–23)
CO2: 29 mmol/L (ref 22–32)
Calcium: 8.3 mg/dL — ABNORMAL LOW (ref 8.9–10.3)
Chloride: 93 mmol/L — ABNORMAL LOW (ref 98–111)
Creatinine, Ser: 1.6 mg/dL — ABNORMAL HIGH (ref 0.44–1.00)
GFR, Estimated: 33 mL/min — ABNORMAL LOW
Glucose, Bld: 167 mg/dL — ABNORMAL HIGH (ref 70–99)
Potassium: 4 mmol/L (ref 3.5–5.1)
Sodium: 134 mmol/L — ABNORMAL LOW (ref 135–145)
Total Bilirubin: 0.5 mg/dL (ref 0.0–1.2)
Total Protein: 7.8 g/dL (ref 6.5–8.1)

## 2024-08-18 LAB — RESP PANEL BY RT-PCR (RSV, FLU A&B, COVID)  RVPGX2
Influenza A by PCR: NEGATIVE
Influenza B by PCR: NEGATIVE
Resp Syncytial Virus by PCR: NEGATIVE
SARS Coronavirus 2 by RT PCR: NEGATIVE

## 2024-08-18 LAB — CBC WITH DIFFERENTIAL/PLATELET
Abs Immature Granulocytes: 0.05 K/uL (ref 0.00–0.07)
Basophils Absolute: 0 K/uL (ref 0.0–0.1)
Basophils Relative: 0 %
Eosinophils Absolute: 0.1 K/uL (ref 0.0–0.5)
Eosinophils Relative: 1 %
HCT: 38.7 % (ref 36.0–46.0)
Hemoglobin: 12.5 g/dL (ref 12.0–15.0)
Immature Granulocytes: 1 %
Lymphocytes Relative: 16 %
Lymphs Abs: 1.2 K/uL (ref 0.7–4.0)
MCH: 30.9 pg (ref 26.0–34.0)
MCHC: 32.3 g/dL (ref 30.0–36.0)
MCV: 95.8 fL (ref 80.0–100.0)
Monocytes Absolute: 0.7 K/uL (ref 0.1–1.0)
Monocytes Relative: 10 %
Neutro Abs: 5.4 K/uL (ref 1.7–7.7)
Neutrophils Relative %: 72 %
Platelets: 181 K/uL (ref 150–400)
RBC: 4.04 MIL/uL (ref 3.87–5.11)
RDW: 16.5 % — ABNORMAL HIGH (ref 11.5–15.5)
WBC: 7.5 K/uL (ref 4.0–10.5)
nRBC: 0 % (ref 0.0–0.2)

## 2024-08-18 LAB — I-STAT VENOUS BLOOD GAS, ED
Acid-Base Excess: 9 mmol/L — ABNORMAL HIGH (ref 0.0–2.0)
Bicarbonate: 34 mmol/L — ABNORMAL HIGH (ref 20.0–28.0)
Calcium, Ion: 0.88 mmol/L — CL (ref 1.15–1.40)
HCT: 40 % (ref 36.0–46.0)
Hemoglobin: 13.6 g/dL (ref 12.0–15.0)
O2 Saturation: 91 %
Potassium: 4 mmol/L (ref 3.5–5.1)
Sodium: 135 mmol/L (ref 135–145)
TCO2: 35 mmol/L — ABNORMAL HIGH (ref 22–32)
pCO2, Ven: 48.1 mmHg (ref 44–60)
pH, Ven: 7.457 — ABNORMAL HIGH (ref 7.25–7.43)
pO2, Ven: 58 mmHg — ABNORMAL HIGH (ref 32–45)

## 2024-08-18 LAB — CBG MONITORING, ED
Glucose-Capillary: 189 mg/dL — ABNORMAL HIGH (ref 70–99)
Glucose-Capillary: 121 mg/dL — ABNORMAL HIGH (ref 70–99)
Glucose-Capillary: 106 mg/dL — ABNORMAL HIGH (ref 70–99)

## 2024-08-18 LAB — I-STAT CHEM 8, ED
BUN: 10 mg/dL (ref 8–23)
Calcium, Ion: 0.88 mmol/L — CL (ref 1.15–1.40)
Chloride: 93 mmol/L — ABNORMAL LOW (ref 98–111)
Creatinine, Ser: 1.7 mg/dL — ABNORMAL HIGH (ref 0.44–1.00)
Glucose, Bld: 169 mg/dL — ABNORMAL HIGH (ref 70–99)
HCT: 42 % (ref 36.0–46.0)
Hemoglobin: 14.3 g/dL (ref 12.0–15.0)
Potassium: 4 mmol/L (ref 3.5–5.1)
Sodium: 135 mmol/L (ref 135–145)
TCO2: 31 mmol/L (ref 22–32)

## 2024-08-18 LAB — PHOSPHORUS: Phosphorus: 2.7 mg/dL (ref 2.5–4.6)

## 2024-08-18 LAB — TROPONIN T, HIGH SENSITIVITY
Troponin T High Sensitivity: 70 ng/L — ABNORMAL HIGH (ref 0–19)
Troponin T High Sensitivity: 64 ng/L — ABNORMAL HIGH (ref 0–19)

## 2024-08-18 LAB — MAGNESIUM: Magnesium: 2 mg/dL (ref 1.7–2.4)

## 2024-08-18 MED ORDER — ONDANSETRON HCL 4 MG PO TABS: 4.0000 mg | ORAL_TABLET | Freq: Four times a day (QID) | ORAL | Status: DC | PRN

## 2024-08-18 MED ORDER — ONDANSETRON HCL 4 MG/2ML IJ SOLN: 4.0000 mg | Freq: Four times a day (QID) | INTRAMUSCULAR | Status: DC | PRN

## 2024-08-18 MED ORDER — APIXABAN 2.5 MG PO TABS
2.5000 mg | ORAL_TABLET | Freq: Two times a day (BID) | ORAL | Status: DC
  Administered 2024-08-18 – 2024-08-19 (×2): 2.5 mg via ORAL
  Filled 2024-08-18 (×2): qty 1

## 2024-08-18 MED ORDER — INSULIN ASPART 100 UNIT/ML IJ SOLN: 0.0000 [IU] | Freq: Three times a day (TID) | INTRAMUSCULAR | Status: DC

## 2024-08-18 MED ORDER — ENOXAPARIN SODIUM 40 MG/0.4ML IJ SOSY: 40.0000 mg | PREFILLED_SYRINGE | INTRAMUSCULAR | Status: DC

## 2024-08-18 MED ORDER — CALCIUM GLUCONATE-NACL 1-0.675 GM/50ML-% IV SOLN
1.0000 g | Freq: Once | INTRAVENOUS | Status: AC
  Administered 2024-08-18: 1000 mg via INTRAVENOUS
  Filled 2024-08-18: qty 50

## 2024-08-18 MED ORDER — ATORVASTATIN CALCIUM 10 MG PO TABS
10.0000 mg | ORAL_TABLET | Freq: Every day | ORAL | Status: DC
  Administered 2024-08-18 – 2024-08-19 (×2): 10 mg via ORAL
  Filled 2024-08-18 (×2): qty 1

## 2024-08-18 MED ORDER — CARVEDILOL 3.125 MG PO TABS: 3.1250 mg | ORAL_TABLET | Freq: Two times a day (BID) | ORAL | Status: DC

## 2024-08-18 MED ORDER — ACETAMINOPHEN 650 MG RE SUPP: 650.0000 mg | Freq: Four times a day (QID) | RECTAL | Status: DC | PRN

## 2024-08-18 MED ORDER — ACETAMINOPHEN 325 MG PO TABS: 650.0000 mg | ORAL_TABLET | Freq: Four times a day (QID) | ORAL | Status: DC | PRN

## 2024-08-18 MED ORDER — CALCIUM GLUCONATE-NACL 2-0.675 GM/100ML-% IV SOLN
2.0000 g | Freq: Once | INTRAVENOUS | Status: AC
  Administered 2024-08-18: 2000 mg via INTRAVENOUS
  Filled 2024-08-18: qty 100

## 2024-08-18 MED ORDER — INSULIN GLARGINE 100 UNIT/ML ~~LOC~~ SOLN
10.0000 [IU] | Freq: Every day | SUBCUTANEOUS | Status: DC
  Administered 2024-08-18: 10 [IU] via SUBCUTANEOUS
  Filled 2024-08-18 (×3): qty 0.1

## 2024-08-18 NOTE — ED Notes (Signed)
 CCMD called by this RN

## 2024-08-18 NOTE — H&P (Signed)
 " History and Physical    Samantha [name] FMW:[id number] DOB: 12-Mar-1946 DOA: 08/18/2024  PCP: Sherlynn Madden, MD   Chief Complaint:  loss of consciousness  HPI: Samantha [name] is a 78 y.o. female with medical history significant of type 2 diabetes, ESRD, history of stroke, hypertension who presented to the emergency department with a syncopal episode at dialysis.  During the episode she was reportedly found to be hypotensive and was given IV fluids.  EMS was called and she was transported to the ER for further assessment.  In the ER she returned to her baseline.  This has happened in the past when she has been dry during dialysis labs were obtained and per [facility] which showed WBC 7.5, hemoglobin 12.5, creatinine 1.6, sodium 134, troponin 70, 64, respiratory viral panel was negative.  Chest x-ray was obtained which showed no acute findings.  This patient was admitted for further workup.  On admission she was resting comfortably.  She was found to have hypocalcemia and was admitted for calcium  repletion   Review of Systems: Review of Systems  Constitutional: Negative.  Negative for chills and fever.  HENT: Negative.    Eyes: Negative.   Respiratory: Negative.    Cardiovascular: Negative.   Gastrointestinal: Negative.   Genitourinary: Negative.   Musculoskeletal: Negative.   Skin: Negative.   Neurological: Negative.   Endo/Heme/Allergies: Negative.      As per HPI otherwise 10 point review of systems negative.   Allergies[1]  Past Medical History:  Diagnosis Date   Allergy    Anemia    Arthritis    Cerebrovascular accident (CVA) due to occlusion of cerebral artery (HCC) 07/12/2019   CHF (congestive heart failure) (HCC)    CVA (cerebral vascular accident) (HCC)    residual left sided weakness   Dementia (HCC)    Diabetes (HCC)    Diabetes mellitus without complication (HCC)    ESRD (end stage renal disease) on dialysis (HCC)    Heart failure (HCC)    History of CT scan     History of heart attack    History of MRI    History of stroke    HTN (hypertension)    Kidney failure    Osteoporosis    PEG (percutaneous endoscopic gastrostomy) status (HCC)    Sepsis (HCC) 04/01/2023   SIRS (systemic inflammatory response syndrome) (HCC) 04/06/2023   Thyroid  disease     Past Surgical History:  Procedure Laterality Date   ABDOMINAL HYSTERECTOMY     GALLBLADDER SURGERY     IR EXCHANGE BILIARY DRAIN  02/11/2023   IR EXCHANGE BILIARY DRAIN  04/13/2023   IR EXCHANGE BILIARY DRAIN  06/15/2023   IR EXCHANGE BILIARY DRAIN  08/23/2023   IR EXCHANGE BILIARY DRAIN  10/05/2023   IR EXCHANGE BILIARY DRAIN  11/30/2023   IR EXCHANGE BILIARY DRAIN  01/25/2024   IR EXCHANGE BILIARY DRAIN  03/21/2024   IR EXCHANGE BILIARY DRAIN  05/04/2024   IR EXCHANGE BILIARY DRAIN  06/29/2024   IR EXCHANGE BILIARY DRAIN  08/03/2024   IR PERC CHOLECYSTOSTOMY  12/16/2022   IR REPLACE G-TUBE SIMPLE WO FLUORO  08/23/2023   IR REPLC GASTRO/COLONIC TUBE PERCUT W/FLUORO  06/15/2023   IR REPLC GASTRO/COLONIC TUBE PERCUT W/FLUORO  10/05/2023   IR REPLC GASTRO/COLONIC TUBE PERCUT W/FLUORO  11/30/2023   IR REPLC GASTRO/COLONIC TUBE PERCUT W/FLUORO  03/21/2024   IR REPLC GASTRO/COLONIC TUBE PERCUT W/FLUORO  08/03/2024     reports that she has never smoked. She has never  used smokeless tobacco. She reports that she does not currently use alcohol. She reports that she does not use drugs.  Family History  Problem Relation Age of Onset   Diabetes Mother     Prior to Admission medications  Medication Sig Start Date End Date Taking? Authorizing Provider  acetaminophen  (TYLENOL ) 500 MG tablet Take 500 mg by mouth daily as needed for mild pain (pain score 1-3).   Yes [provider]  atorvastatin  (LIPITOR) 10 MG tablet Take 1 tablet by mouth once daily 06/12/24  Yes Veludandi, Prashanthi, MD  carvedilol  (COREG ) 3.125 MG tablet Take 3.125 mg Q AM  and 3.125 mg Q PM on non dialysis days (Tues, Thurs,  Sat and Sun,  - On dialysis days,  MWF, continue Coreg  3.125 mg Q PM -  Hold for SBP < 100 05/02/24  Yes Sherlynn Madden, MD  ELIQUIS  2.5 MG TABS tablet Take 1 tablet by mouth twice daily 08/14/24  Yes Veludandi, Prashanthi, MD  Glucagon (BAQSIMI ONE PACK) 3 MG/DOSE POWD Place 1 packet into the nose as needed (For blood sugar). Use when blood sugar drops below 70.   Yes [provider]  insulin  aspart (NOVOLOG ) 100 UNIT/ML injection 0-9 Units, Subcutaneous, 3 times daily with meals, CBG < 70: Implement Hypoglycemia measures CBG 70 - 120: 0 units CBG 121 - 150: 1 unit CBG 151 - 200: 2 units CBG 201 - 250: 3 units CBG 251 - 300: 5 units CBG 301 - 350: 7 units CBG 351 - 400: 9 units CBG > 400: call MD 12/25/22  Yes Ghimire, Donalda HERO, MD  insulin  glargine (LANTUS ) 100 UNIT/ML Solostar Pen Inject 18 Units into the skin at bedtime. 05/02/24  Yes Sherlynn Madden, MD  LOKELMA 10 g PACK packet Take 1 packet by mouth as directed. Taken tues, thur, sat 04/12/24  Yes [provider]  Multiple Vitamins-Minerals (MULTIVITAMIN WITH MINERALS) tablet Take 1 tablet by mouth at bedtime.   Yes [provider]  Nutritional Supplements (FEEDING SUPPLEMENT, OSMOLITE 1.5 CAL,) LIQD Place 600 mLs into feeding tube See admin instructions. 8p-6a   Yes [provider]  polyethylene glycol powder (MIRALAX ) 17 GM/SCOOP powder Take 17 g by mouth 2 (two) times daily as needed for moderate constipation or mild constipation. 12/09/23  Yes Gonfa, Taye T, MD  senna (SENOKOT) 8.6 MG TABS tablet Take 1 tablet by mouth at bedtime.   Yes [provider]  simethicone (MYLICON) 125 MG chewable tablet Chew 125 mg by mouth 2 (two) times daily after a meal.   Yes [provider]  COVID-19 mRNA vaccine, Moderna, >/= 34yrs, (SPIKEVAX ) injection Inject 0.5 mLs into the muscle as directed. 05/02/24   Veludandi, Prashanthi, MD  Methoxy PEG-Epoetin  Beta (MIRCERA IJ) 75 mcg as directed. Taken at  dialysis 03/06/24 03/19/25  [provider]    Physical Exam: Vitals:   08/18/24 1638 08/18/24 1640 08/18/24 1953 08/18/24 2108  BP:  109/65 (!) 165/69   Pulse:  73 74   Resp:  18 18   Temp:  97.8 F (36.6 C)  98.3 F (36.8 C)  TempSrc:  Oral  Oral  SpO2:  100% 100%   Weight: 50.8 kg     Height: 4' 11 (1.499 m)      Physical Exam Constitutional:      Appearance: She is normal weight.  HENT:     Head: Normocephalic.     Nose: Nose normal.     Mouth/Throat:     Mouth:  Mucous membranes are moist.     Pharynx: Oropharynx is clear.  Eyes:     Conjunctiva/sclera: Conjunctivae normal.     Pupils: Pupils are equal, round, and reactive to light.  Cardiovascular:     Rate and Rhythm: Normal rate and regular rhythm.     Pulses: Normal pulses.  Pulmonary:     Effort: Pulmonary effort is normal.     Breath sounds: Normal breath sounds.  Abdominal:     General: Abdomen is flat. Bowel sounds are normal.  Musculoskeletal:        General: Normal range of motion.     Cervical back: Normal range of motion.  Skin:    General: Skin is warm.  Neurological:     General: No focal deficit present.     Mental Status: She is alert and oriented to person, place, and time.  Psychiatric:        Mood and Affect: Mood normal.       Labs on Admission: I have personally reviewed the patients's labs and imaging studies.  Assessment/Plan Principal Problem:   Hypocalcemia   #Hypocalcemia- #Syncope #Hx of Heart failure reduced EF, not in excerbation.  -patient found to have low caclium in setting of dialysis -etiology of syncope likely related to excessive volume removal - TTE 04/2024 showed reduced EF PLAN: Replete calcium  Low threshold to administer IV fluids  # Paroxysmal A-fib-continue Eliquis   # Hyperlipidemia-continue Lipitor  # Hypertension-continue carvedilol   # Type 2 diabetes-placed on Lantus  and lispro  #ESRD on HD- consult nephro in morning.    Admission  status: Observation Telemetry  Certification: The appropriate patient status for this patient is OBSERVATION. Observation status is judged to be reasonable and necessary in order to provide the required intensity of service to ensure the patient's safety. The patient's presenting symptoms, physical exam findings, and initial radiographic and laboratory data in the context of their medical condition is felt to place them at decreased risk for further clinical deterioration. Furthermore, it is anticipated that the patient will be medically stable for discharge from the hospital within 2 midnights of admission.     Lamar Dess MD [facility] If 7PM-7AM, please contact night-coverage [email or web address]  08/18/2024, 9:47 PM        [1]  Allergies Allergen Reactions   Asa [Aspirin] Anaphylaxis   Penicillins Anaphylaxis    **Tolerates cephalosporins   "

## 2024-08-18 NOTE — ED Triage Notes (Signed)
 Pt to er via ems, per ems pt had a syncopal episode at dialysis, states that she was found to be hypotensive, states that dialysis gave of a fluid bolus, ems states that they found her to be normo tensive. Pt states that she doesn't know why she is here, pt is awake and oriented to person and place, doesn't know day of the week, re oriented pt.

## 2024-08-18 NOTE — ED Provider Notes (Signed)
 " [facility] DE[facility]MENT AT [facility] Provider Note   CSN: [id number] Arrival date & time: 08/18/24  1635     History  Chief Complaint  Patient presents with   Loss of Consciousness    Samantha [name] is a 78 y.o. female with PMH as listed below who presents with a syncopal episode at dialysis, states that she was found to be hypotensive, states that dialysis gave of a fluid bolus, ems states that they found her to be normotensive. Pt states that she doesn't know why she is here, pt is awake and oriented to person and place, doesn't know day of the week.  Daughter at bedside provides history d/t patient's dementia. Daughter states patient is at her baseline mental status. Had been in her NSOh this AM, ate good breakfast. Lost consciousness at dialysis, daughtert states this has happened before and wonders if she had too much fluid taken off but it's been a long time. Hadn't complained of any chest pain or lightheadedness. No N/V/D. Has had a dry cough the last few days but no f/c or SOB.  Takes lokelma for hyperkalemia.    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 Authorizing Provider  acetaminophen  (TYLENOL ) 500 MG tablet Take 500 mg by mouth daily as needed for mild pain (pain score 1-3).    [provider]  atorvastatin  (LIPITOR) 10 MG tablet Take 1 tablet by mouth once daily 06/12/24   Veludandi, Prashanthi, MD  carvedilol  (COREG ) 3.125 MG tablet Take 3.125 mg Q AM  and 3.125 mg Q PM on non dialysis days (Tues, Thurs, Sat and Sun,  - On dialysis days,  MWF, continue Coreg  3.125 mg Q PM -  Hold for SBP < 100 05/02/24   Sherlynn Madden, MD  COVID-19 mRNA vaccine, Moderna, >/= 56yrs, (SPIKEVAX ) injection Inject 0.5 mLs into the muscle as directed. 05/02/24   Sherlynn Madden, MD  ELIQUIS  2.5 MG TABS tablet Take 1 tablet by mouth twice daily 08/14/24   Veludandi, Prashanthi, MD  Glucagon (BAQSIMI ONE PACK) 3 MG/DOSE POWD Place 1 packet into the nose as needed (For blood sugar). Use when blood sugar drops below 70.    [provider]  insulin  aspart (NOVOLOG ) 100 UNIT/ML injection 0-9 Units, Subcutaneous, 3 times daily with meals, CBG < 70: Implement Hypoglycemia measures CBG 70 - 120: 0 units CBG 121 - 150: 1 unit CBG 151 - 200: 2 units CBG 201 - 250: 3 units CBG 251 - 300: 5 units  CBG 301 - 350: 7 units CBG 351 - 400: 9 units CBG > 400: call MD 12/25/22   Raenelle Donalda HERO, MD  insulin  glargine (LANTUS ) 100 UNIT/ML Solostar Pen Inject 18 Units into the skin at bedtime. 05/02/24   Sherlynn Madden, MD  LOKELMA 10 g PACK packet Take 1 packet by mouth as directed. Taken tues, thur, sat 04/12/24   [provider]  Methoxy PEG-Epoetin  Beta (MIRCERA IJ) 75 mcg as directed. Taken at dialysis 03/06/24 03/19/25  [provider]  Multiple Vitamins-Minerals (MULTIVITAMIN WITH MINERALS) tablet Take 1 tablet by mouth at bedtime.    [provider]  Nutritional Supplements (FEEDING SUPPLEMENT, OSMOLITE 1.5 CAL,) LIQD Place 600 mLs into feeding tube See admin instructions. 8p-6a    [provider]   polyethylene glycol powder (MIRALAX ) 17 GM/SCOOP powder Take 17 g by mouth 2 (two) times daily as needed for moderate constipation or mild constipation. 12/09/23   Gonfa, Taye T, MD  senna (SENOKOT) 8.6 MG TABS tablet Take 1 tablet by mouth at bedtime.    [provider]  simethicone (MYLICON) 125 MG chewable tablet Chew 125 mg by mouth 2 (two) times daily after a meal.    [provider]      Allergies    Asa [aspirin] and Penicillins    Review of Systems   Review of Systems A 10 point review of systems was performed and is negative unless otherwise reported in HPI.  Physical Exam Updated Vital Signs BP 109/65 (BP Location: Right Arm)   Pulse 73   Temp 97.8 F (36.6 C) (Oral)   Resp 18   Ht 4' 11 (1.499 m)   Wt 50.8 kg   SpO2 100%   BMI 22.62 kg/m  Physical Exam General: Chronically ill-appearing elderly female, lying in bed. HEENT: PERRLA, Sclera anicteric, MMM, trachea midline.  Cardiology: RRR, no murmurs/rubs/gallops. BL radial and DP pulses equal bilaterally.  Resp: Normal respiratory rate and effort. CTAB, no wheezes, rhonchi, crackles.  Abd: Soft, non-tender, non-distended. No rebound tenderness or guarding.  GU: Deferred. MSK: No peripheral edema or signs of trauma. Extremities without deformity or TTP. No cyanosis or clubbing. Skin: warm, dry. No rashes or lesions. Back: No CVA tenderness Neuro: A&Ox2 (not to time or situation), CNs II-XII grossly intact. MAEs. Sensation grossly intact.   ED Results / Procedures / Treatments   Labs (all labs ordered are listed, but only abnormal results are displayed) Labs Reviewed  CBC WITH DIFFERENTIAL/PLATELET - Abnormal; Notable for the following components:      Result Value   RDW 16.5 (*)    All other components within normal limits  COMPREHENSIVE METABOLIC PANEL WITH GFR - Abnormal; Notable for the following components:   Sodium 134 (*)    Chloride 93 (*)    Glucose, Bld 167 (*)    Creatinine, Ser  1.60 (*)    Calcium  8.3 (*)    Alkaline Phosphatase 197 (*)    GFR, Estimated 33 (*)    All other components within normal limits  PROTIME-INR - Abnormal; Notable for the following components:   Prothrombin Time 17.7 (*)    INR 1.4 (*)    All other components within normal limits  BLOOD GAS, VENOUS - Abnormal; Notable for the following components:   Bicarbonate 33.6 (*)    Acid-Base Excess 7.3 (*)    All other components within normal limits  I-STAT CHEM 8, ED - Abnormal; Notable for the following components:   Chloride 93 (*)  Creatinine, Ser 1.70 (*)    Glucose, Bld 169 (*)    Calcium , Ion 0.88 (*)    All other components within normal limits  I-STAT VENOUS BLOOD GAS, ED - Abnormal; Notable for the following components:   pH, Ven 7.457 (*)    pO2, Ven 58 (*)    Bicarbonate 34.0 (*)    TCO2 35 (*)    Acid-Base Excess 9.0 (*)    Calcium , Ion 0.88 (*)    All other components within normal limits  CBG MONITORING, ED - Abnormal; Notable for the following components:   Glucose-Capillary 106 (*)    All other components within normal limits  CBG MONITORING, ED - Abnormal; Notable for the following components:   Glucose-Capillary 106 (*)    All other components within normal limits  TROPONIN T, HIGH SENSITIVITY - Abnormal; Notable for the following components:   Troponin T High Sensitivity 70 (*)    All other components within normal limits  TROPONIN T, HIGH SENSITIVITY - Abnormal; Notable for the following components:   Troponin T High Sensitivity 64 (*)    All other components within normal limits  RESP PANEL BY RT-PCR (RSV, FLU A&B, COVID)  RVPGX2  MAGNESIUM   PHOSPHORUS  TSH  T4, FREE  CALCIUM , IONIZED    EKG EKG Interpretation Date/Time:  Friday August 18 2024 16:45:02 EST Ventricular Rate:  75 PR Interval:  157 QRS Duration:  94 QT Interval:  491 QTC Calculation: 549 R Axis:   -71  Text Interpretation: Sinus rhythm Left anterior fascicular block Consider  anterior infarct Nonspecific T abnormalities, lateral leads Prolonged QT interval Confirmed by Franklyn Gills [id number]) on 08/18/2024 7:04:49 PM  Radiology CT HEAD WO CONTRAST ( ) Result Date: 08/19/2024 EXAM: CT HEAD WITHOUT CONTRAST 08/19/2024 10:25:00 AM TECHNIQUE: CT of the head was performed without the administration of intravenous contrast. Automated exposure control, iterative reconstruction, and/or weight based adjustment of the mA/kV was utilized to reduce the radiation dose to as low as reasonably achievable. COMPARISON: None available. CLINICAL HISTORY: 78 year old female with end stage renal disease, syncope at dialysis, and altered mental status. FINDINGS: BRAIN AND VENTRICLES: No acute hemorrhage. No evidence of acute infarct. No extra-axial collection. No mass effect or midline shift. Multifocal chronic cerebral encephalomalacia, most pronounced in the anterior and superior frontal gyri. Confluent superimposed bilateral cerebral white matter hypodensity. Ex vacuo appearing ventricular enlargement. Calcified atherosclerosis at the skull base. Pronounced calcified left MCA atherosclerosis. No suspicious intracranial vascular hyperdensity. ORBITS: No acute abnormality. SINUSES: Paranasal sinuses, tympanic cavities and mastoids are well aerated. SOFT TISSUES AND SKULL: No acute soft tissue abnormality. No skull fracture. Incidental benign left frontal bony exostosis on series 4 image 59 (normal variant). IMPRESSION: 1. No acute intracranial abnormality by CT. Multifocal chronic cerebral encephalomalacia. Electronically signed by: Helayne Hurst MD 08/19/2024 10:31 AM EST RP Workstation: [id number]   DG Chest Portable 1 View Result Date: 08/18/2024 EXAM: 1 VIEW(S) XRAY OF THE CHEST 08/18/2024 05:37:00 PM COMPARISON: 03/05/2024 CLINICAL HISTORY: cough, LOC FINDINGS: LINES, TUBES AND DEVICES: Right IJ CVC in place with tip overlying right atrium projecting towards the right ventricle, unchanged. LUNGS  AND PLEURA: Low lung volumes. No focal pulmonary opacity. No pleural effusion. No pneumothorax. HEART AND MEDIASTINUM: Aortic atherosclerosis. No acute abnormality of the cardiac and mediastinal silhouettes. BONES AND SOFT TISSUES: Surgical clips in left axilla. No acute osseous abnormality. IMPRESSION: 1. No acute cardiopulmonary abnormality. 2. Right internal jugular central venous catheter tip overlies the right atrium, unchanged. Electronically signed by:  Greig Pique MD 08/18/2024 07:05 PM EST RP Workstation: [id number]    Procedures Procedures    Medications Ordered in ED Medications  calcium  gluconate 1 g/ 50 mL sodium chloride  IVPB (0 mg Intravenous Stopped 08/18/24 2209)    ED Course/ Medical Decision Making/ A&P                          Medical Decision Making Amount and/or Complexity of Data Reviewed Labs: ordered. Decision-making details documented in ED Course. Radiology: ordered.  Risk Prescription drug management. Decision regarding hospitalization.    This patient presents to the ED for concern of syncope, this involves an extensive number of treatment options, and is a complaint that carries with it a high risk of complications and morbidity.  I considered the following differential and admission for this acute, potentially life threatening condition.   MDM:    DDX for syncope includes but is not limited to:  No abnormal anemia or hypo-/hyperglycemia on labs.  She has no neurologic deficits at this time per daughter at bedside, at her neurologic baseline, no observable deficits on exam.  Doubt CVA.  CT head without any acute abnormalities.  Had not complained of any chest pain or shortness of breath, EKG without signs of ischemia.  No observed seizure-like activity.  No pneumonia, pulmonary edema, pleural effusion noted on chest x-ray, given report of cough.  Viral panel is negative.  No hypoxia or tachypnea hemoptysis chest pain or shortness of breath, no asymmetric  leg swelling, overall less concern for pulmonary embolism.  Consider low volume due to ultrafiltration, patient did feel improved after 500 cc by EMS.  Daughter states that this used to happen very frequently but has not happened for years on dialysis.  Patient with low calcium  and a prolonged QT on EKG that is new.  Will order a mag and Phos as well.  Patient will have IV calcium  repletion.  Given her prolonged QT and loss of consciousness today that is out of the norm for her at dialysis must consider a tacky arrhythmia or life-threatening arrhythmia such as VT, torsades, VF.  Patient is normal sinus rhythm on telemetry now.  Will need telemetry monitoring and admission.  Clinical Course as of 08/19/24 1556  Fri Aug 18, 2024  1829 Calcium  Ionized(!!): 0.88 Will replete calcium  [HN]  1903 Resp panel by RT-PCR (RSV, Flu A&B, Covid) Anterior Nasal Swab neg [HN]    Clinical Course User Index [HN] Franklyn Sid SAILOR, MD    Labs: I Ordered, and personally interpreted labs.  The pertinent results include:  those listed above  Imaging Studies ordered: I ordered imaging studies including CXR I independently visualized and interpreted imaging. I agree with the radiologist interpretation  Additional history obtained from chart review, daughter at bedside.    Cardiac Monitoring: The patient was maintained on a cardiac monitor.  I personally viewed and interpreted the cardiac monitored which showed an underlying rhythm of: NSR  Reevaluation: After the interventions noted above, I reevaluated the patient and found that they have :improved  Social Determinants of Health: Lives with daughter  Disposition:  Admit to medicine  Co morbidities that complicate the patient evaluation  Past Medical History:  Diagnosis Date   Allergy    Anemia    Arthritis    Cerebrovascular accident (CVA) due to occlusion of cerebral artery (HCC) 07/12/2019   CHF (congestive heart failure) (HCC)    CVA (cerebral  vascular accident) (HCC)  residual left sided weakness   Dementia (HCC)    Diabetes (HCC)    Diabetes mellitus without complication (HCC)    ESRD (end stage renal disease) on dialysis (HCC)    Heart failure (HCC)    History of CT scan    History of heart attack    History of MRI    History of stroke    HTN (hypertension)    Kidney failure    Osteoporosis    PEG (percutaneous endoscopic gastrostomy) status (HCC)    Sepsis (HCC) 04/01/2023   SIRS (systemic inflammatory response syndrome) (HCC) 04/06/2023   Thyroid  disease      Medicines No orders of the defined types were placed in this encounter.   I have reviewed the patients home medicines and have made adjustments as needed  Problem List / ED Course: Problem List Items Addressed This Visit       Other   * (Principal) Hypocalcemia   Other Visit Diagnoses       Syncope and collapse    -  Primary     Prolonged Q-T interval on ECG                       This note was created using dictation software, which may contain spelling or grammatical errors.    Franklyn Sid SAILOR, MD 08/19/24 [phone]  "

## 2024-08-19 ENCOUNTER — Observation Stay (HOSPITAL_COMMUNITY)

## 2024-08-19 ENCOUNTER — Encounter (HOSPITAL_COMMUNITY): Payer: Self-pay | Admitting: Internal Medicine

## 2024-08-19 DIAGNOSIS — R569 Unspecified convulsions: Secondary | ICD-10-CM

## 2024-08-19 DIAGNOSIS — R55 Syncope and collapse: Secondary | ICD-10-CM

## 2024-08-19 LAB — CBC
HCT: 33.9 % — ABNORMAL LOW (ref 36.0–46.0)
Hemoglobin: 10.7 g/dL — ABNORMAL LOW (ref 12.0–15.0)
MCH: 30.8 pg (ref 26.0–34.0)
MCHC: 31.6 g/dL (ref 30.0–36.0)
MCV: 97.7 fL (ref 80.0–100.0)
Platelets: 143 K/uL — ABNORMAL LOW (ref 150–400)
RBC: 3.47 MIL/uL — ABNORMAL LOW (ref 3.87–5.11)
RDW: 16.8 % — ABNORMAL HIGH (ref 11.5–15.5)
WBC: 7.4 K/uL (ref 4.0–10.5)
nRBC: 0 % (ref 0.0–0.2)

## 2024-08-19 LAB — CBG MONITORING, ED
Glucose-Capillary: 61 mg/dL — ABNORMAL LOW (ref 70–99)
Glucose-Capillary: 92 mg/dL (ref 70–99)
Glucose-Capillary: 72 mg/dL (ref 70–99)
Glucose-Capillary: 83 mg/dL (ref 70–99)
Glucose-Capillary: 73 mg/dL (ref 70–99)
Glucose-Capillary: 106 mg/dL — ABNORMAL HIGH (ref 70–99)
Glucose-Capillary: 80 mg/dL (ref 70–99)
Glucose-Capillary: 84 mg/dL (ref 70–99)

## 2024-08-19 LAB — BASIC METABOLIC PANEL WITH GFR
Anion gap: 11 (ref 5–15)
BUN: 15 mg/dL (ref 8–23)
CO2: 27 mmol/L (ref 22–32)
Calcium: 9.4 mg/dL (ref 8.9–10.3)
Chloride: 95 mmol/L — ABNORMAL LOW (ref 98–111)
Creatinine, Ser: 2.16 mg/dL — ABNORMAL HIGH (ref 0.44–1.00)
GFR, Estimated: 23 mL/min — ABNORMAL LOW
Glucose, Bld: 59 mg/dL — ABNORMAL LOW (ref 70–99)
Potassium: 4.5 mmol/L (ref 3.5–5.1)
Sodium: 133 mmol/L — ABNORMAL LOW (ref 135–145)

## 2024-08-19 LAB — GLUCOSE, CAPILLARY
Glucose-Capillary: 98 mg/dL (ref 70–99)
Glucose-Capillary: 84 mg/dL (ref 70–99)
Glucose-Capillary: 69 mg/dL — ABNORMAL LOW (ref 70–99)
Glucose-Capillary: 158 mg/dL — ABNORMAL HIGH (ref 70–99)

## 2024-08-19 LAB — BLOOD GAS, VENOUS
Acid-Base Excess: 7.3 mmol/L — ABNORMAL HIGH (ref 0.0–2.0)
Bicarbonate: 33.6 mmol/L — ABNORMAL HIGH (ref 20.0–28.0)
O2 Saturation: 72.6 %
Patient temperature: 36
pCO2, Ven: 51 mmHg (ref 44–60)
pH, Ven: 7.42 (ref 7.25–7.43)
pO2, Ven: 38 mmHg (ref 32–45)

## 2024-08-19 LAB — PROTIME-INR
INR: 1.4 — ABNORMAL HIGH (ref 0.8–1.2)
Prothrombin Time: 17.7 s — ABNORMAL HIGH (ref 11.4–15.2)

## 2024-08-19 LAB — TSH: TSH: 3.44 u[IU]/mL (ref 0.350–4.500)

## 2024-08-19 LAB — VITAMIN B12: Vitamin B-12: 1452 pg/mL — ABNORMAL HIGH (ref 180–914)

## 2024-08-19 LAB — AMMONIA: Ammonia: 37 umol/L — ABNORMAL HIGH (ref 9–35)

## 2024-08-19 LAB — T4, FREE: Free T4: 1.05 ng/dL (ref 0.80–2.00)

## 2024-08-19 MED ORDER — CHLORHEXIDINE GLUCONATE CLOTH 2 % EX PADS
6.0000 | MEDICATED_PAD | Freq: Every day | CUTANEOUS | Status: DC
  Administered 2024-08-19: 6 via TOPICAL

## 2024-08-19 MED ORDER — SODIUM CHLORIDE 0.9% FLUSH: 10.0000 mL | Freq: Two times a day (BID) | INTRAVENOUS | Status: DC

## 2024-08-19 MED ORDER — OSMOLITE 1.5 CAL PO LIQD: 600.0000 mL | ORAL | Status: DC

## 2024-08-19 MED ORDER — ORAL CARE MOUTH RINSE
15.0000 mL | OROMUCOSAL | Status: DC
  Administered 2024-08-19 – 2024-08-20 (×3): 15 mL via OROMUCOSAL

## 2024-08-19 MED ORDER — DEXTROSE 50 % IV SOLN
INTRAVENOUS | Status: AC
  Filled 2024-08-19: qty 50

## 2024-08-19 MED ORDER — SODIUM CHLORIDE 0.9% FLUSH: 10.0000 mL | INTRAVENOUS | Status: DC | PRN

## 2024-08-19 MED ORDER — APIXABAN 2.5 MG PO TABS
2.5000 mg | ORAL_TABLET | Freq: Two times a day (BID) | ORAL | Status: DC
  Administered 2024-08-19 – 2024-08-20 (×2): 2.5 mg
  Filled 2024-08-19 (×2): qty 1

## 2024-08-19 MED ORDER — HYDRALAZINE HCL 20 MG/ML IJ SOLN: 5.0000 mg | INTRAMUSCULAR | Status: DC | PRN

## 2024-08-19 MED ORDER — ATORVASTATIN CALCIUM 10 MG PO TABS
10.0000 mg | ORAL_TABLET | Freq: Every day | ORAL | Status: DC
  Administered 2024-08-20: 10 mg
  Filled 2024-08-19: qty 1

## 2024-08-19 MED ORDER — ORAL CARE MOUTH RINSE: 15.0000 mL | OROMUCOSAL | Status: DC | PRN

## 2024-08-19 MED ORDER — DEXTROSE 50 % IV SOLN
12.5000 g | INTRAVENOUS | Status: AC
  Administered 2024-08-19: 12.5 g via INTRAVENOUS

## 2024-08-19 MED ORDER — INSULIN ASPART 100 UNIT/ML IJ SOLN
0.0000 [IU] | INTRAMUSCULAR | Status: DC
  Administered 2024-08-20: 1 [IU] via SUBCUTANEOUS
  Administered 2024-08-20: 2 [IU] via SUBCUTANEOUS
  Administered 2024-08-20: 1 [IU] via SUBCUTANEOUS
  Filled 2024-08-19 (×2): qty 1
  Filled 2024-08-19: qty 2

## 2024-08-19 MED ORDER — OSMOLITE 1.2 CAL PO LIQD
1000.0000 mL | ORAL | Status: DC
  Administered 2024-08-19: 1000 mL
  Filled 2024-08-19 (×3): qty 1000

## 2024-08-19 NOTE — ED Notes (Addendum)
 At request of daughter, checked patient's blood sugar. Patient received supplemental feedings via peg tube and daughter reports patient's blood sugar will drop when not receiving feedings.  Blood sugar 61. Attending made aware, no orders given, instructed to feed patient.  Administered juice via peg tube.Will monitor BG

## 2024-08-19 NOTE — Progress Notes (Signed)
 [facility] Progress Note Patient: Samantha [name] FMW:[id number] DOB: 03-20-1946  DOA: 08/18/2024 DOS: the patient was seen and examined on 08/19/2024  Brief Hospital Course: Patient with PMH of T2DM, ESRD, HTN, CVA with residual left-sided weakness presented to the hospital with unresponsive event at hemodialysis. Assessment and Plan: Unresponsive event. Consult for syncope. Acute encephalopathy likely metabolic Patient gets HD MWF. On HD 12/26 patient comes unresponsive. Blood pressure was low. Patient was given 500 mL bolus. Upon EMS arrival blood pressure improved.  Mentation did not improve and patient was referred to the hospitalization. At the time of my evaluation patient remains lethargic and not at baseline. CT head reassuring no acute abnormality. EEG negative for any acute seizures. Metabolic workup shows B12 1000+, ammonia 37, free T4 1.05, TSH 3.4. CBg remains low.  VBG shows no evidence of hypercarbia. At present we will treat hypoglycemia and monitor for improvement in mentation. PT OT consulted.  Hypotension. Likely in the setting of volume depletion. No indication to provide IV hydration for now. Will monitor clinically.  Paroxysmal A-fib. History of DVT SP IVC filter. On Eliquis . Will continue the same for now. Rate controlled.,  On Coreg .  Currently on hold.  Chronic combined CHF. Echo done September 2025.  EF 40 to 45%. Grade 1 diastolic dysfunction. Volume managed with hemodialysis. Next HD will be on Monday. Will consult nephrology if patient remains in the hospital beyond Sunday.  ESRD on HD MWF. Continue HD for now as a routine. Currently no indication for acute hemodialysis.  Dysphagia. Poor p.o. intake. Dysphagia.  At her baseline. Due to poor p.o. intake she is on nocturnal tube feeds with Osmolite. Due to poor p.o. intake right now here in the hospital with persistent hypoglycemia I will continue her on continuous tube  feed. Dietitian consulted.  Type 2 diabetes mellitus, uncontrolled with hyper and hypoglycemia with long-term insulin  use with ESRD. Currently due to hypoglycemia with withholding long-acting insulin . Placing her on acute 4-hour sliding scale. Initiating on continuous tube feeding due to persistent hypoglycemic spells.  Subjective: Patient lethargic.  Unable to follow commands consistently.  Able to answer questions occasionally.  No nausea no vomiting reported.  No diarrhea constipation reported.  Overnight remained hypoglycemic.  Physical Exam: General: in Mild distress, No Rash Cardiovascular: S1 and S2 Present, No Murmur Respiratory: Good respiratory effort, Bilateral Air entry present. No Crackles, No wheezes Abdomen: Bowel Sound present, difficult to assess tenderness Extremities: trace edema Neuro: Lethargic, oriented to self, left-sided weakness chronic.  Data Reviewed: I have Reviewed nursing notes, Vitals, and Lab results. Since last encounter, pertinent lab results CBC and BMP   . I have ordered test including CBC and BMP  .  Ordered CT head.  Ordered EEG.  Disposition: Status is: Observation  apixaban  (ELIQUIS ) tablet 2.5 mg Start: 08/19/24 2200 SCDs Start: 08/18/24 2146 apixaban  (ELIQUIS ) tablet 2.5 mg    Family Communication: Discussed with daughter from the phone. Level of care: Telemetry   Vitals:   08/19/24 1200 08/19/24 1400 08/19/24 1613 08/19/24 1745  BP: (!) 172/73 (!) 158/73 (!) 136/56 (!) 122/94  Pulse: 65 71 66 63  Resp: 16 15 16 19   Temp:  98.1 F (36.7 C) 97.9 F (36.6 C) 98.4 F (36.9 C)  TempSrc:  Oral Oral   SpO2: 100% 100% 95% 100%  Weight:      Height:         Author: Yetta Blanch, MD 08/19/2024 5:47 PM  Please look on [email or web address] to find out  who is on call.

## 2024-08-19 NOTE — ED Notes (Signed)
 ennifer Del Rosario, the patient's daughter is requesting a status update on her mother's admissions and would like to discuss her plan of care. She can be reached at [phone], please and thank you.

## 2024-08-19 NOTE — ED Notes (Signed)
 Changed patient's brief and sheets changed after bowel movement.

## 2024-08-19 NOTE — Progress Notes (Signed)
 Hypoglycemic Event  CBG:   Latest Reference Range & Units 08/19/24 19:52  Glucose-Capillary 70 - 99 mg/dL 69 (L)  (L): Data is abnormally low  Treatment: D50 25 mL (12.5 gm)  Symptoms: None  Follow-up CBG: Time:  CBG Result   Latest Reference Range & Units 08/19/24 20:56  Glucose-Capillary 70 - 99 mg/dL 841 (H)  (H): Data is abnormally high Possible Reasons for Event: Inadequate meal intake  Comments/MD notified:    Breion Novacek Arzadon

## 2024-08-19 NOTE — Progress Notes (Signed)
 EEG complete - results pending

## 2024-08-19 NOTE — ED Notes (Signed)
 Spoke with daughter Delon who advised pt does not ambulate at home, they use a Hoyer lift with her and wheelchair, as well as Nepro and Osmalite feeding through her feeding tube, provider notified.

## 2024-08-19 NOTE — ED Notes (Signed)
 Patient blood sugar remains on the lower side of normal and will drop. Attending is aware. Given more orange juice to maintain appropriate Blood sugar.

## 2024-08-19 NOTE — Procedures (Signed)
 Patient Name: Elicia Lui  MRN: [id number]  Epilepsy Attending: Arlin MALVA Krebs  Referring Physician/Provider: Tobie Yetta HERO, MD  Date: 08/19/2024 Duration: 22.27 mins  Patient history: 78yo F with syncope. EEG to evaluate for seizure  Level of alertness: Awake  AEDs during EEG study: None  Technical aspects: This EEG study was done with scalp electrodes positioned according to the 10-20 International system of electrode placement. Electrical activity was reviewed with band pass filter of 1-70Hz , sensitivity of 7 uV/mm, display speed of 36mm/sec with a 60Hz  notched filter applied as appropriate. EEG data were recorded continuously and digitally stored.  Video monitoring was available and reviewed as appropriate.  Description: The posterior dominant rhythm consists of 8-9 Hz activity of moderate voltage (25-35 uV) seen predominantly in posterior head regions, symmetric and reactive to eye opening and eye closing. Hyperventilation and photic stimulation were not performed.     IMPRESSION: This study is within normal limits. No seizures or epileptiform discharges were seen throughout the recording.  A normal interictal EEG does not exclude the diagnosis of epilepsy.   Floyde Dingley O Kaleo Condrey

## 2024-08-20 ENCOUNTER — Encounter: Payer: Self-pay | Admitting: Sports Medicine

## 2024-08-20 LAB — CBC WITH DIFFERENTIAL/PLATELET
Abs Immature Granulocytes: 0.02 K/uL (ref 0.00–0.07)
Basophils Absolute: 0 K/uL (ref 0.0–0.1)
Basophils Relative: 0 %
Eosinophils Absolute: 0.1 K/uL (ref 0.0–0.5)
Eosinophils Relative: 2 %
HCT: 34.3 % — ABNORMAL LOW (ref 36.0–46.0)
Hemoglobin: 11.1 g/dL — ABNORMAL LOW (ref 12.0–15.0)
Immature Granulocytes: 0 %
Lymphocytes Relative: 28 %
Lymphs Abs: 2 K/uL (ref 0.7–4.0)
MCH: 31 pg (ref 26.0–34.0)
MCHC: 32.4 g/dL (ref 30.0–36.0)
MCV: 95.8 fL (ref 80.0–100.0)
Monocytes Absolute: 0.8 K/uL (ref 0.1–1.0)
Monocytes Relative: 11 %
Neutro Abs: 4.2 K/uL (ref 1.7–7.7)
Neutrophils Relative %: 59 %
Platelets: 144 K/uL — ABNORMAL LOW (ref 150–400)
RBC: 3.58 MIL/uL — ABNORMAL LOW (ref 3.87–5.11)
RDW: 16.2 % — ABNORMAL HIGH (ref 11.5–15.5)
WBC: 7.1 K/uL (ref 4.0–10.5)
nRBC: 0 % (ref 0.0–0.2)

## 2024-08-20 LAB — GLUCOSE, CAPILLARY
Glucose-Capillary: 115 mg/dL — ABNORMAL HIGH (ref 70–99)
Glucose-Capillary: 117 mg/dL — ABNORMAL HIGH (ref 70–99)
Glucose-Capillary: 162 mg/dL — ABNORMAL HIGH (ref 70–99)
Glucose-Capillary: 162 mg/dL — ABNORMAL HIGH (ref 70–99)
Glucose-Capillary: 174 mg/dL — ABNORMAL HIGH (ref 70–99)
Glucose-Capillary: 242 mg/dL — ABNORMAL HIGH (ref 70–99)

## 2024-08-20 LAB — COMPREHENSIVE METABOLIC PANEL WITH GFR
ALT: 20 U/L (ref 0–44)
AST: 33 U/L (ref 15–41)
Albumin: 3.4 g/dL — ABNORMAL LOW (ref 3.5–5.0)
Alkaline Phosphatase: 153 U/L — ABNORMAL HIGH (ref 38–126)
Anion gap: 13 (ref 5–15)
BUN: 31 mg/dL — ABNORMAL HIGH (ref 8–23)
CO2: 23 mmol/L (ref 22–32)
Calcium: 9.2 mg/dL (ref 8.9–10.3)
Chloride: 92 mmol/L — ABNORMAL LOW (ref 98–111)
Creatinine, Ser: 3.11 mg/dL — ABNORMAL HIGH (ref 0.44–1.00)
GFR, Estimated: 15 mL/min — ABNORMAL LOW
Glucose, Bld: 232 mg/dL — ABNORMAL HIGH (ref 70–99)
Potassium: 5.2 mmol/L — ABNORMAL HIGH (ref 3.5–5.1)
Sodium: 129 mmol/L — ABNORMAL LOW (ref 135–145)
Total Bilirubin: 0.2 mg/dL (ref 0.0–1.2)
Total Protein: 6.2 g/dL — ABNORMAL LOW (ref 6.5–8.1)

## 2024-08-20 LAB — MAGNESIUM: Magnesium: 2.2 mg/dL (ref 1.7–2.4)

## 2024-08-20 MED ORDER — INSULIN GLARGINE 100 UNIT/ML SOLOSTAR PEN: 14.0000 [IU] | PEN_INJECTOR | Freq: Every day | SUBCUTANEOUS | Status: DC

## 2024-08-20 MED ORDER — ZINC OXIDE 40 % EX OINT
TOPICAL_OINTMENT | Freq: Two times a day (BID) | CUTANEOUS | Status: DC
  Filled 2024-08-20: qty 57

## 2024-08-20 MED ORDER — ZINC OXIDE 40 % EX OINT: TOPICAL_OINTMENT | Freq: Two times a day (BID) | CUTANEOUS | 0 refills | Status: AC

## 2024-08-20 NOTE — Consult Note (Signed)
 WOC Nurse Consult Note: Reason for Consult: sacral wound  Wound type: ? Healing Stage 3 Pressure Injury 90% thick hyperkeratotic tissue with 2 small open red moist areas  Pressure Injury POA: Yes Measurement: see nursing flowsheet  Wound bed: as above  Drainage (amount, consistency, odor) see nursing flowsheet  Periwound: dark discoloration noted above sacral wound  Dressing procedure/placement/frequency: Cleanse sacral wound with soap and water , dry and apply Desitin to area 2 times a day and prn soiling. Cover with silicone foam or ABD pad and clothe tape whichever is preferred.   POC discussed with bedside nurse. WOC team will not follow. Reconsult if further needs arise.   Thank you,    Powell Bar MSN, RN-BC, [employer]

## 2024-08-20 NOTE — Discharge Summary (Signed)
 " Physician Discharge Summary   Patient: Samantha [name] MRN: [id number] DOB: Feb 23, 1946  Admit date:     08/18/2024  Discharge date: 08/20/2024  Discharge Physician: Yetta Blanch  PCP: Sherlynn Madden, MD  Recommendations at discharge: Follow-up with PCP in 1 week. Continue dialysis as scheduled. If the patient remains hypotensive on the days of the dialysis patient will benefit from addition of midodrine.  Hospital Course: Patient with PMH of T2DM, ESRD, HTN, CVA with residual left-sided weakness presented to the hospital with unresponsive event at hemodialysis.  Assessment and Plan: Unresponsive event. Concern for syncope. Hypotension. Acute encephalopathy likely metabolic Patient gets HD MWF. On HD 12/26 patient comes unresponsive.  Her blood pressure was low. This happened around the end of this. Patient was given 500 mL bolus. Upon EMS arrival blood pressure improved.  Mentation did not improve and patient was referred to the hospitalization. CT head reassuring no acute abnormality. EEG negative for any acute seizures. Metabolic workup shows B12 1000+, ammonia 37, free T4 1.05, TSH 3.4. Initially CBG was low although patient missed her home tube feeding and received insulin  Lantus  on top of that. VBG shows no evidence of hypercarbia. Patient was placed on continuous tube feeding instead of nocturnal feeding which helped improve glucose level. At present mentation is back to baseline. Patient will resume home tube feeding upon discharge. Recommend to reduce insulin  dose. Recommend to use midodrine with hemodialysis if blood pressure remains soft. Patient active with home health PT OT at home.  Continue wound care at home.   Hypotension. Likely in the setting of volume depletion. On Coreg .  Resume.  Paroxysmal A-fib. History of DVT SP IVC filter. On Eliquis . Will continue the same for now. Rate controlled.,  On Coreg .    Chronic combined CHF. Echo done  September 2025.  EF 40 to 45%. Grade 1 diastolic dysfunction. Volume managed with hemodialysis. Next HD will be on Monday.  Okay to continue outpatient on Monday.   ESRD on HD MWF. Continue HD for now as a routine. Currently no indication for acute hemodialysis.   Dysphagia. Poor p.o. intake. Dysphagia 3 diet at her baseline. Due to poor p.o. intake she is on nocturnal tube feeds with Osmolite.   Type 2 diabetes mellitus, uncontrolled with hyper and hypoglycemia with long-term insulin  use with ESRD. Her long-acting insulin  was held in the hospital due to hypoglycemia. Resuming at a lower dose upon discharge.  Consultants:  none  Procedures performed:  EEG  DISCHARGE MEDICATION: Allergies as of 08/20/2024       Reactions   Asa [aspirin] Anaphylaxis   Penicillins Anaphylaxis   **Tolerates cephalosporins        Medication List     TAKE these medications    acetaminophen  500 MG tablet Commonly known as: TYLENOL  Take 500 mg by mouth daily as needed for mild pain (pain score 1-3).   atorvastatin  10 MG tablet Commonly known as: LIPITOR Take 1 tablet by mouth once daily   Baqsimi One Pack 3 MG/DOSE Powd Generic drug: Glucagon Place 1 packet into the nose as needed (For blood sugar). Use when blood sugar drops below 70.   carvedilol  3.125 MG tablet Commonly known as: COREG  Take 3.125 mg Q AM  and 3.125 mg Q PM on non dialysis days (Tues, Thurs, Sat and Sun,  - On dialysis days,  MWF, continue Coreg  3.125 mg Q PM -  Hold for SBP < 100   Eliquis  2.5 MG Tabs tablet Generic drug: apixaban  Take 1  tablet by mouth twice daily   feeding supplement (OSMOLITE 1.5 CAL) Liqd Place 600 mLs into feeding tube See admin instructions. 8p-6a   insulin  aspart 100 UNIT/ML injection Commonly known as: novoLOG  0-9 Units, Subcutaneous, 3 times daily with meals, CBG < 70: Implement Hypoglycemia measures CBG 70 - 120: 0 units CBG 121 - 150: 1 unit CBG 151 - 200: 2 units CBG 201 - 250:  3 units CBG 251 - 300: 5 units CBG 301 - 350: 7 units CBG 351 - 400: 9 units CBG > 400: call MD   insulin  glargine 100 UNIT/ML Solostar Pen Commonly known as: LANTUS  Inject 14 Units into the skin at bedtime. What changed: how much to take   liver oil-zinc  oxide 40 % ointment Commonly known as: DESITIN Apply topically 2 (two) times daily.   Lokelma 10 g Pack packet Generic drug: sodium zirconium cyclosilicate Take 1 packet by mouth as directed. Taken tues, thur, sat   MIRCERA IJ 75 mcg as directed. Taken at dialysis   multivitamin with minerals tablet Take 1 tablet by mouth at bedtime.   polyethylene glycol powder 17 GM/SCOOP powder Commonly known as: MiraLax  Take 17 g by mouth 2 (two) times daily as needed for moderate constipation or mild constipation.   senna 8.6 MG Tabs tablet Commonly known as: SENOKOT Take 1 tablet by mouth at bedtime.   simethicone 125 MG chewable tablet Commonly known as: MYLICON Chew 125 mg by mouth 2 (two) times daily after a meal.   Spikevax  injection Generic drug: COVID-19 mRNA vaccine (Moderna, >/= 79yrs) Inject 0.5 mLs into the muscle as directed.               Discharge Care Instructions  (From admission, onward)           Start     Ordered   08/20/24 0000  Discharge wound care:       Comments: Cleanse sacral wound with soap and water , dry and apply Desitin to area 2 times a day and prn soiling. Cover with silicone foam or ABD pad and clothe tape whichever is preferred   08/20/24 1403           Disposition: Home Diet recommendation: Dysphagia type 3 thin Liquid  Discharge Exam: Vitals:   08/19/24 1942 08/20/24 0400 08/20/24 0500 08/20/24 0815  BP: (!) 149/68 (!) 156/58  (!) 142/75  Pulse: 62 70  73  Resp: 18 17  19   Temp: 97.9 F (36.6 C) 97.7 F (36.5 C)  98.3 F (36.8 C)  TempSrc:  Oral    SpO2: 99% 100%  100%  Weight:   58.8 kg   Height:       General: in Mild distress, No Rash Cardiovascular: S1 and S2  Present, No Murmur Respiratory: Good respiratory effort, Bilateral Air entry present. No Crackles, No wheezes Abdomen: Bowel Sound present, No tenderness Extremities: No edema Neuro: Alert and oriented to self and place, no new focal deficit  Chronic left-sided weakness.  Pupils are equal but sluggish to react. Filed Weights   08/18/24 1638 08/20/24 0500  Weight: 50.8 kg 58.8 kg   Condition at discharge: stable  The results of significant diagnostics from this hospitalization (including imaging, microbiology, ancillary and laboratory) are listed below for reference.   Imaging Studies: EEG adult Result Date: 08/19/2024 Shelton Arlin KIDD, MD     08/19/2024 11:55 AM Patient Name: Kymberley Etienne MRN: [id number] Epilepsy Attending: Arlin KIDD Shelton Referring Physician/Provider: Tobie Yetta HERO, MD Date: 08/19/2024  Duration: 22.27 mins Patient history: 78yo F with syncope. EEG to evaluate for seizure Level of alertness: Awake AEDs during EEG study: None Technical aspects: This EEG study was done with scalp electrodes positioned according to the 10-20 International system of electrode placement. Electrical activity was reviewed with band pass filter of 1-70Hz , sensitivity of 7 uV/mm, display speed of 102mm/sec with a 60Hz  notched filter applied as appropriate. EEG data were recorded continuously and digitally stored.  Video monitoring was available and reviewed as appropriate. Description: The posterior dominant rhythm consists of 8-9 Hz activity of moderate voltage (25-35 uV) seen predominantly in posterior head regions, symmetric and reactive to eye opening and eye closing. Hyperventilation and photic stimulation were not performed.   IMPRESSION: This study is within normal limits. No seizures or epileptiform discharges were seen throughout the recording. A normal interictal EEG does not exclude the diagnosis of epilepsy. Priyanka MALVA Krebs   CT HEAD WO CONTRAST ( ) Result Date: 08/19/2024 EXAM: CT  HEAD WITHOUT CONTRAST 08/19/2024 10:25:00 AM TECHNIQUE: CT of the head was performed without the administration of intravenous contrast. Automated exposure control, iterative reconstruction, and/or weight based adjustment of the mA/kV was utilized to reduce the radiation dose to as low as reasonably achievable. COMPARISON: None available. CLINICAL HISTORY: 78 year old female with end stage renal disease, syncope at dialysis, and altered mental status. FINDINGS: BRAIN AND VENTRICLES: No acute hemorrhage. No evidence of acute infarct. No extra-axial collection. No mass effect or midline shift. Multifocal chronic cerebral encephalomalacia, most pronounced in the anterior and superior frontal gyri. Confluent superimposed bilateral cerebral white matter hypodensity. Ex vacuo appearing ventricular enlargement. Calcified atherosclerosis at the skull base. Pronounced calcified left MCA atherosclerosis. No suspicious intracranial vascular hyperdensity. ORBITS: No acute abnormality. SINUSES: Paranasal sinuses, tympanic cavities and mastoids are well aerated. SOFT TISSUES AND SKULL: No acute soft tissue abnormality. No skull fracture. Incidental benign left frontal bony exostosis on series 4 image 59 (normal variant). IMPRESSION: 1. No acute intracranial abnormality by CT. Multifocal chronic cerebral encephalomalacia. Electronically signed by: Helayne Hurst MD 08/19/2024 10:31 AM EST RP Workstation: [id number]   DG Chest Portable 1 View Result Date: 08/18/2024 EXAM: 1 VIEW(S) XRAY OF THE CHEST 08/18/2024 05:37:00 PM COMPARISON: 03/05/2024 CLINICAL HISTORY: cough, LOC FINDINGS: LINES, TUBES AND DEVICES: Right IJ CVC in place with tip overlying right atrium projecting towards the right ventricle, unchanged. LUNGS AND PLEURA: Low lung volumes. No focal pulmonary opacity. No pleural effusion. No pneumothorax. HEART AND MEDIASTINUM: Aortic atherosclerosis. No acute abnormality of the cardiac and mediastinal silhouettes. BONES AND  SOFT TISSUES: Surgical clips in left axilla. No acute osseous abnormality. IMPRESSION: 1. No acute cardiopulmonary abnormality. 2. Right internal jugular central venous catheter tip overlies the right atrium, unchanged. Electronically signed by: Greig Pique MD 08/18/2024 07:05 PM EST RP Workstation: [id number]   IR EXCHANGE BILIARY DRAIN Result Date: 08/03/2024 INDICATION: History of acute cholecystitis, with chronic indwelling cholecystostomy tube placement on 12/16/2022. Last exchange 06/29/2024. EXAM: CHOLECYSTOSTOMY TUBE EXCHANGE COMPARISON:  IR fluoroscopy, 06/29/2024.  CT AP, 03/05/2024. MEDICATIONS: None ANESTHESIA/SEDATION: Local anesthetic was administered. CONTRAST:  20mL OMNIPAQUE  IOHEXOL  300 MG/ML SOLN - administered into the gallbladder lumen. FLUOROSCOPY: Radiation Exposure Index and estimated peak skin dose (PSD); Reference air kerma (RAK), 4 mGy. COMPLICATIONS: None immediate. PROCEDURE: The patient was positioned supine on the fluoroscopy table. The external portion of the existing cholecystostomy tube as well as the surrounding skin was prepped and draped in usual sterile fashion. A time-out was performed prior to the initiation of the  procedure. A preprocedural spot fluoroscopic image was obtained of the right upper abdominal quadrant existing cholecystostomy tube. The skin surrounding the cholecystostomy tube was anesthetized with 1% lidocaine  with epinephrine . The external portion of the cholecystostomy tube was cut and cannulated with a short Amplatz wire which was advanced through the tube and coiled within the gallbladder lumen. Next, under intermittent fluoroscopic guidance, the existing 10 French cholecystostomy tube was exchanged for a new 10 Fr cholecystostomy tube which was repositioned into the more central aspect of the gallbladder lumen. Contrast injection confirms appropriate positioning and functionality of the cholecystomy tube. The cholecystostomy tube was flushed with a  small amount of saline and reconnected to a gravity bag. The cholecystostomy tube was secured with an interrupted suture and a Stat Lock device. A dressing was applied. The patient tolerated the procedure well without immediate postprocedural complication. FINDINGS: *Preprocedural spot fluoroscopic image demonstrates unchanged positioning of cholecystostomy tube with end coiled and locked over the expected location of the fundus of the gallbladder *After fluoroscopic guided exchange, the new 10 Fr cholecystostomy tube is more ideally positioned with end coiled and locked within the central aspect of the gallbladder lumen. *Post exchange cholangiogram demonstrates appropriate positioning and functionality of the new cholecystostomy tube. IMPRESSION: Successful fluoroscopic guided exchange and repositioning of a new 10 Fr cholecystostomy tube. PLAN: * The patient will return to Vascular Interventional Radiology (VIR) for routine drainage catheter evaluation and exchange in 6-8 weeks. *She had previously been evaluated by general surgery and deferred for cholecystectomy, as she was deemed high risk. A potential for minimally invasive treatment with percutaneous cholangioscopy and biliary stone removal (SpyGlass) may be an option. I briefly spoke with the patient's daughter [facility]) and sent a message to the patient's referring surgeon. If interested, a referral to Vascular and interventional Radiology (VIR) to discuss the option could be initiated. Thom Hall, MD [facility] Electronically Signed   By: Thom Hall M.D.   On: 08/03/2024 16:42   IR Replc Gastro/Colonic Tube Percut W/Fluoro Result Date: 08/03/2024 INDICATION: due for change per daughter Briefly, 78 year old female feeding tube dependent with chronic indwelling gastrostomy tube, last exchange 03/21/2024. Routine exchange. EXAM: GASTROSTOMY TUBE EXCHANGE COMPARISON:  IR fluoroscopy, 03/21/2024.   CT AP, 03/05/2024. MEDICATIONS: None. CONTRAST:  0 mL-administered into the gastric lumen FLUOROSCOPY TIME:  None COMPLICATIONS: None immediate. PROCEDURE: Informed written consent was obtained from the patient and/or patient's representative after a discussion of the risks, benefits and alternatives to treatment. Questions regarding the procedure were encouraged and answered. A timeout was performed prior to the initiation of the procedure. The upper abdomen and external portion of the existing gastrostomy tube was prepped and draped in the usual sterile fashion, and a sterile drape was applied covering the operative field. Maximum barrier sterile technique with sterile gowns and gloves were used for the procedure. A timeout was performed prior to the initiation of the procedure. The external portion of the gastrostomy tube was cut decompressing the retention balloon. Next, a new 20 Fr balloon inflatable gastrostomy tube was inserted. The balloon was inflated with saline and dilute contrast and pulled against the anterior inner lumen of the stomach and the external disc was cinched. Succus was aspirated, confirming appropriate positioning and functionality of the new gastrostomy tube. A dressing was applied. The patient tolerated the procedure well without immediate postprocedural complication. IMPRESSION: Successful exchange for a new 20 Fr balloon-inflatable gastrostomy tube. The gastrostomy tube is ready for immediate use. RECOMMENDATIONS: The  patient will return to Vascular Interventional Radiology (VIR) for routine feeding tube evaluation and exchange in 6 months. Thom Hall, MD [facility] Electronically Signed   By: Thom Hall M.D.   On: 08/03/2024 16:06    Microbiology: Results for orders placed or performed during the hospital encounter of 08/18/24  Resp panel by RT-PCR (RSV, Flu A&B, Covid) Anterior Nasal Swab     Status: None    Collection Time: 08/18/24  5:10 PM   Specimen: Anterior Nasal Swab  Result Value Ref Range Status   SARS Coronavirus 2 by RT PCR NEGATIVE NEGATIVE Final   Influenza A by PCR NEGATIVE NEGATIVE Final   Influenza B by PCR NEGATIVE NEGATIVE Final    Comment: (NOTE) The Xpert Xpress SARS-CoV-2/FLU/RSV plus assay is intended as an aid in the diagnosis of influenza from Nasopharyngeal swab specimens and should not be used as a sole basis for treatment. Nasal washings and aspirates are unacceptable for Xpert Xpress SARS-CoV-2/FLU/RSV testing.  Fact Sheet for Patients: bloggercourse.com  Fact Sheet for Healthcare Providers: seriousbroker.it  This test is not yet approved or cleared by the [place]  FDA and has been authorized for detection and/or diagnosis of SARS-CoV-2 by FDA under an Emergency Use Authorization (EUA). This EUA will remain in effect (meaning this test can be used) for the duration of the COVID-19 declaration under Section 564(b)(1) of the Act, 21 U.S.C. section 360bbb-3(b)(1), unless the authorization is terminated or revoked.     Resp Syncytial Virus by PCR NEGATIVE NEGATIVE Final    Comment: (NOTE) Fact Sheet for Patients: bloggercourse.com  Fact Sheet for Healthcare Providers: seriousbroker.it  This test is not yet approved or cleared by the [place]  FDA and has been authorized for detection and/or diagnosis of SARS-CoV-2 by FDA under an Emergency Use Authorization (EUA). This EUA will remain in effect (meaning this test can be used) for the duration of the COVID-19 declaration under Section 564(b)(1) of the Act, 21 U.S.C. section 360bbb-3(b)(1), unless the authorization is terminated or revoked.  Performed at [facility], [address] [place], [place] [address]    Labs: CBC: Recent Labs  Lab 08/18/24 1642 08/18/24 1727 08/19/24 0242  08/20/24 1127  WBC 7.5  --  7.4 7.1  NEUTROABS 5.4  --   --  4.2  HGB 12.5 14.3  13.6 10.7* 11.1*  HCT 38.7 42.0  40.0 33.9* 34.3*  MCV 95.8  --  97.7 95.8  PLT 181  --  143* 144*   Basic Metabolic Panel: Recent Labs  Lab 08/18/24 1642 08/18/24 1727 08/18/24 1842 08/19/24 0242 08/20/24 1127  NA 134* 135  135  --  133* 129*  K 4.0 4.0  4.0  --  4.5 5.2*  CL 93* 93*  --  95* 92*  CO2 29  --   --  27 23  GLUCOSE 167* 169*  --  59* 232*  BUN 9 10  --  15 31*  CREATININE 1.60* 1.70*  --  2.16* 3.11*  CALCIUM  8.3*  --   --  9.4 9.2  MG  --   --  2.0  --  2.2  PHOS  --   --  2.7  --   --    Liver Function Tests: Recent Labs  Lab 08/18/24 1642 08/20/24 1127  AST 39 33  ALT 16 20  ALKPHOS 197* 153*  BILITOT 0.5 0.2  PROT 7.8 6.2*  ALBUMIN 4.1 3.4*   CBG:  Recent Labs  Lab 08/20/24 0016 08/20/24 0359 08/20/24 0804 08/20/24 0815 08/20/24 1128  GLUCAP 115* 162* 162* 174* 242*    Discharge time spent: 35 minutes  Author: Yetta Blanch, MD  [facility] 08/20/2024   "

## 2024-08-20 NOTE — Care Management Obs Status (Signed)
 MEDICARE OBSERVATION STATUS NOTIFICATION   Patient Details  Name: Samantha [name] MRN: [id number] Date of Birth: [birth year]   Medicare Observation Status Notification Given:  Yes    Robynn Eileen Hoose, RN 08/20/2024, 10:58 AM

## 2024-08-20 NOTE — TOC Transition Note (Signed)
 Transition of Care [facility]) - Discharge Note   Patient Details  Name: Samantha [name] MRN: [id number] Date of Birth: November 16, 1945  [facility]) CM/SW Contact:  Robynn Eileen Hoose, RN Phone Number: 08/20/2024, 2:57 PM   Clinical Narrative:   Patient is being discharged home today. Secure message from floor nurse regarding patient needing ambulance transportation home. [facility] transportation arranged, forms completed and sent to the the unit. Floor nurse aware.          Patient Goals and CMS Choice            Discharge Placement                       Discharge Plan and Services Additional resources added to the After Visit Summary for                                       Social Drivers of Health (SDOH) Interventions SDOH Screenings   Food Insecurity: No Food Insecurity (08/19/2024)  Housing: Low Risk (08/19/2024)  Transportation Needs: No Transportation Needs (08/19/2024)  Utilities: Not At Risk (08/19/2024)  Depression (PHQ2-9): Low Risk (07/25/2024)  Recent Concern: Depression (PHQ2-9) - Medium Risk (07/25/2024)  Financial Resource Strain: Low Risk (06/20/2023)  Physical Activity: Inactive (07/06/2024)  Social Connections: Moderately Isolated (08/19/2024)  Stress: No Stress Concern Present (07/06/2024)  Tobacco Use: Low Risk (08/19/2024)  Health Literacy: Inadequate Health Literacy (07/06/2024)     Readmission Risk Interventions    12/09/2023    1:41 PM 04/01/2023    3:49 PM  Readmission Risk Prevention Plan  Transportation Screening Complete Complete  Medication Review (RN Care Manager) Complete Referral to Pharmacy  PCP or Specialist appointment within 3-5 days of discharge Complete Complete  HRI or Home Care Consult Complete Complete  SW Recovery Care/Counseling Consult Complete Complete  Palliative Care Screening Not Applicable Not Applicable  Skilled Nursing Facility Not Applicable Not Applicable

## 2024-08-20 NOTE — Progress Notes (Signed)
 IV team in her room. Patient refused a NSL inserted.

## 2024-08-20 NOTE — Care Management Obs Status (Signed)
 MEDICARE OBSERVATION STATUS NOTIFICATION   Patient Details  Name: Samantha [name] MRN: [id number] Date of Birth: 01-11-1946   Medicare Observation Status Notification Given:  No (Unable to reach family)    Robynn Eileen Hoose, RN 08/20/2024, 9:43 AM

## 2024-08-20 NOTE — TOC Progression Note (Signed)
 Transition of Care [facility]) - Progression Note    Patient Details  Name: Samantha [name] MRN: [id number] Date of Birth: July 10, 1946  [facility]) CM/SW Contact  Robynn Eileen Hoose, RN Phone Number: 08/20/2024, 10:59 AM  Clinical Narrative:   Spoke with daughter Delon Terry Dawes, reports that patient has home health aide/caregiver 7 days a week through [employer].                       Expected Discharge Plan and Services                                               Social Drivers of Health (SDOH) Interventions SDOH Screenings   Food Insecurity: No Food Insecurity (08/19/2024)  Housing: Low Risk (08/19/2024)  Transportation Needs: No Transportation Needs (08/19/2024)  Utilities: Not At Risk (08/19/2024)  Depression (PHQ2-9): Low Risk (07/25/2024)  Recent Concern: Depression (PHQ2-9) - Medium Risk (07/25/2024)  Financial Resource Strain: Low Risk (06/20/2023)  Physical Activity: Inactive (07/06/2024)  Social Connections: Moderately Isolated (08/19/2024)  Stress: No Stress Concern Present (07/06/2024)  Tobacco Use: Low Risk (08/19/2024)  Health Literacy: Inadequate Health Literacy (07/06/2024)    Readmission Risk Interventions    12/09/2023    1:41 PM 04/01/2023    3:49 PM  Readmission Risk Prevention Plan  Transportation Screening Complete Complete  Medication Review (RN Care Manager) Complete Referral to Pharmacy  PCP or Specialist appointment within 3-5 days of discharge Complete Complete  HRI or Home Care Consult Complete Complete  SW Recovery Care/Counseling Consult Complete Complete  Palliative Care Screening Not Applicable Not Applicable  Skilled Nursing Facility Not Applicable Not Applicable

## 2024-08-20 NOTE — Plan of Care (Signed)
   Problem: Education: Goal: Knowledge of General Education information will improve Description Including pain rating scale, medication(s)/side effects and non-pharmacologic comfort measures Outcome: Progressing

## 2024-08-21 ENCOUNTER — Telehealth: Payer: Self-pay | Admitting: Nephrology

## 2024-08-21 ENCOUNTER — Telehealth: Payer: Self-pay

## 2024-08-21 LAB — CALCIUM, IONIZED: Calcium, Ionized, Serum: 4.8 mg/dL (ref 4.5–5.6)

## 2024-08-21 NOTE — Transitions of Care (Post Inpatient/ED Visit) (Unsigned)
" ° °  08/21/2024  Name: Samantha [name] MRN: [id number] DOB: [birth year]  Today's TOC FU Call Status: Today's TOC FU Call Status:: Unsuccessful Call (1st Attempt) Unsuccessful Call (1st Attempt) Date: 08/21/24  Attempted to reach the patient regarding the most recent Inpatient/ED visit.  Follow Up Plan: Additional outreach attempts will be made to reach the patient to complete the Transitions of Care (Post Inpatient/ED visit) call.   Signature  Charmaine Bloodgood, LPN [facility] Health Advisor [place] [facility] You Are. We Are. [facility] Direct Dial [phone]  "

## 2024-08-21 NOTE — Telephone Encounter (Signed)
 Pt scheduled

## 2024-08-21 NOTE — Telephone Encounter (Signed)
 Transition of Care - Initial Contact after Hospitalization  Date of discharge:  08/20/2024 Date of contact: 08/21/2024  Method: Phone Spoke to: Patient's daughter  Patient contacted to discuss transition of care from recent inpatient hospitalization. Patient was admitted to [facility] from 12/26 - 08/20/2024 with discharge diagnosis of syncope.  The discharge medication list was reviewed. Patient understands the changes. Plan is to see how she does with dialysis today. Start midodrine pre-HD if ongoing issues.  Patient will return to his/her outpatient HD unit on: today  We discussed continuing Lokelma, consider changing her TF to Nepro. Will message the outpatient dietician to follow-up.  Izetta Boehringer, PA-C [employer] Pager [phone]

## 2024-08-21 NOTE — Discharge Planning (Signed)
 [facility] Patient Discharge Orders - [facility] CLINIC: [facility]  Patient's name: Samantha [name] Admit/DC Dates: 08/18/2024 - 08/20/2024  DISCHARGE DIAGNOSES: Hypotension  Syncope  HD ORDER CHANGES: Heparin  change: no EDW Change: YES New EDW: 58kg [place] Change: no  ANEMIA MANAGEMENT: Aranesp : Given: no    ESA dose for discharge: Same IV Iron dose at discharge: Per protocol Transfusion: Given: no  BONE/MINERAL MEDICATIONS: Hectorol /Calcitriol change: no Sensipar/Parsabiv change: no  ACCESS INTERVENTION/CHANGE: no Details:   RECENT LABS: Recent Labs  Lab 08/18/24 1842 08/19/24 0242 08/20/24 1127  HGB  --    < > 11.1*  NA  --    < > 129*  K  --    < > 5.2*  CALCIUM   --    < > 9.2  PHOS 2.7  --   --   ALBUMIN  --   --  3.4*   < > = values in this interval not displayed.    IV ANTIBIOTICS: no Details:  OTHER ANTICOAGULATION: no Details:  OTHER/APPTS/LAB ORDERS:  - Please review BP trends -- may need to consider midodrine q HD  D/C Meds to be reconciled by nurse after every discharge.  Completed By: Izetta Boehringer, PA-C [facility] Pager [phone]   Reviewed by: MD:______ RN_______

## 2024-08-23 LAB — VITAMIN B1: Vitamin B1 (Thiamine): 132.2 nmol/L (ref 66.5–200.0)

## 2024-08-29 ENCOUNTER — Inpatient Hospital Stay: Admitting: Sports Medicine

## 2024-08-30 NOTE — Transitions of Care (Post Inpatient/ED Visit) (Unsigned)
 "  08/30/2024  Name: Samantha [name] MRN: [id number] DOB: Nov 09, 1945  Today's TOC FU Call Status: Today's TOC FU Call Status:: Successful TOC FU Call Completed Unsuccessful Call (1st Attempt) Date: 08/21/24 [facility] FU Call Complete Date: 08/30/24  Patient's Name and Date of Birth confirmed. Name, DOB  Transition Care Management Follow-up Telephone Call Date of Discharge: 08/20/24 Discharge Facility: [facility]) Type of Discharge: Inpatient Admission Primary Inpatient Discharge Diagnosis:: Hypocalcemia How have you been since you were released from the hospital?: Better Any questions or concerns?: Yes Patient Questions/Concerns:: PAtient is unclear about the reasoning behind giving her the midrodine at dailysis if hypotensive and still taking the BP med in the afternoon. Patient Questions/Concerns Addressed: Notified Provider of Patient Questions/Concerns  Items Reviewed: Did you receive and understand the discharge instructions provided?: Yes Medications obtained,verified, and reconciled?: Yes (Medications Reviewed) Any new allergies since your discharge?: No Dietary orders reviewed?: NA Do you have support at home?: Yes People in Home [RPT]: child(ren), adult  Medications Reviewed Today: Medications Reviewed Today     Reviewed by Will Arnette SAILOR, CMA (Certified Medical Assistant) on 08/30/24 at 1713  Med List Status: <None>   Medication Order Taking? Sig Documenting Provider Last Dose Status Informant  acetaminophen  (TYLENOL ) 500 MG tablet [id number] Yes Take 500 mg by mouth daily as needed for mild pain (pain score 1-3). [provider]  Active Child  atorvastatin  (LIPITOR) 10 MG tablet [id number] Yes Take 1 tablet by mouth once daily Veludandi, Prashanthi, MD  Active Child  carvedilol  (COREG ) 3.125 MG tablet [id number] Yes Take 3.125 mg Q AM  and 3.125 mg Q PM on non dialysis days (Tues, Thurs, Sat and Sun,  - On dialysis days,  MWF, continue Coreg  3.125 mg Q PM -  Hold  for SBP < 100 Sherlynn Madden, MD  Active Child  COVID-19 mRNA vaccine, Moderna, >/= 86yrs, (SPIKEVAX ) injection [id number] Yes Inject 0.5 mLs into the muscle as directed. Sherlynn Madden, MD  Active Child  ELIQUIS  2.5 MG TABS tablet [id number] Yes Take 1 tablet by mouth twice daily Veludandi, Prashanthi, MD  Active Child  Glucagon (BAQSIMI ONE PACK) 3 MG/DOSE POWD [id number] Yes Place 1 packet into the nose as needed (For blood sugar). Use when blood sugar drops below 70. [provider]  Active Child           Med Note ROXINE, JASMINE E   Tue Oct 19, 2023  9:50 AM)    insulin  aspart (NOVOLOG ) 100 UNIT/ML injection [id number] Yes 0-9 Units, Subcutaneous, 3 times daily with meals, CBG < 70: Implement Hypoglycemia measures CBG 70 - 120: 0 units CBG 121 - 150: 1 unit CBG 151 - 200: 2 units CBG 201 - 250: 3 units CBG 251 - 300: 5 units CBG 301 - 350: 7 units CBG 351 - 400: 9 units CBG > 400: call MD Raenelle Donalda HERO, MD  Active Child           Med Note ROXINE, JASMINE E   Tue May 25, 2023 10:02 AM)    insulin  glargine (LANTUS ) 100 UNIT/ML Solostar Pen [id number] Yes Inject 14 Units into the skin at bedtime. Tobie Yetta HERO, MD  Active   liver oil-zinc  oxide (DESITIN) 40 % ointment [id number] Yes Apply topically 2 (two) times daily. Tobie Yetta HERO, MD  Active   LOKELMA 10 g PACK packet [id number] Yes Take 1 packet by mouth as directed. Taken tues, thur, sat [provider]  Active Child  Methoxy PEG-Epoetin  Beta (  MIRCERA IJ) [id number] Yes 75 mcg as directed. Taken at dialysis [provider]  Active Child  Multiple Vitamins-Minerals (MULTIVITAMIN WITH MINERALS) tablet [id number] Yes Take 1 tablet by mouth at bedtime. [provider]  Active Child  Nutritional Supplements (FEEDING SUPPLEMENT, OSMOLITE 1.5 CAL,) LIQD [id number] Yes Place 600 mLs into feeding tube See admin instructions. 8p-6a [provider]  Active Child  polyethylene glycol powder  (MIRALAX ) 17 GM/SCOOP powder [id number] Yes Take 17 g by mouth 2 (two) times daily as needed for moderate constipation or mild constipation. Gonfa, Taye T, MD  Active Child  senna (SENOKOT) 8.6 MG TABS tablet [id number] Yes Take 1 tablet by mouth at bedtime. [provider]  Active Child  simethicone (MYLICON) 125 MG chewable tablet [id number] Yes Chew 125 mg by mouth 2 (two) times daily after a meal. [provider]  Active Child  Med List Note Lorne, Sheffield, CPhT 12/06/23 0848): Dialysis- M, W, F. Daughter jennifer handles medications.             Home Care and Equipment/Supplies: Were Home Health Services Ordered?: NA Any new equipment or medical supplies ordered?: NA  Functional Questionnaire: Do you need assistance with bathing/showering or dressing?: No Do you need assistance with meal preparation?: No Do you need assistance with eating?: No Do you have difficulty maintaining continence: No Do you need assistance with getting out of bed/getting out of a chair/moving?: No Do you have difficulty managing or taking your medications?: No  Follow up appointments reviewed: PCP Follow-up appointment confirmed?: Yes Date of PCP follow-up appointment?: 09/07/24 Follow-up Provider: [facility] Follow-up appointment confirmed?: NA Do you need transportation to your follow-up appointment?: Yes Transportation Need Intervention Addressed By:: Other: Do you understand care options if your condition(s) worsen?: Yes-patient verbalized understanding    SIGNATURE: Levon M,CMA "

## 2024-08-31 ENCOUNTER — Inpatient Hospital Stay: Admitting: Sports Medicine

## 2024-09-01 ENCOUNTER — Telehealth: Payer: Self-pay

## 2024-09-01 NOTE — Telephone Encounter (Signed)
 Type of forms received: [employer]  Routed to: [employer] received by :  fax   Individual made aware of 5-7 business day turn around (Y/N): n/a  Form completed and patient made aware of charges(Y/N): n/a   Faxed to :   Form location: gave to Sheena

## 2024-09-01 NOTE — Telephone Encounter (Signed)
 Form received and placed in folder on Dr Sherlynn desk

## 2024-09-06 ENCOUNTER — Other Ambulatory Visit: Payer: Self-pay | Admitting: Sports Medicine

## 2024-09-06 DIAGNOSIS — I6930 Unspecified sequelae of cerebral infarction: Secondary | ICD-10-CM

## 2024-09-06 DIAGNOSIS — E1122 Type 2 diabetes mellitus with diabetic chronic kidney disease: Secondary | ICD-10-CM

## 2024-09-07 ENCOUNTER — Encounter: Payer: Self-pay | Admitting: Sports Medicine

## 2024-09-07 ENCOUNTER — Ambulatory Visit (INDEPENDENT_AMBULATORY_CARE_PROVIDER_SITE_OTHER): Admitting: Sports Medicine

## 2024-09-07 VITALS — BP 144/102 | HR 86

## 2024-09-07 DIAGNOSIS — E1122 Type 2 diabetes mellitus with diabetic chronic kidney disease: Secondary | ICD-10-CM

## 2024-09-07 DIAGNOSIS — E871 Hypo-osmolality and hyponatremia: Secondary | ICD-10-CM | POA: Diagnosis not present

## 2024-09-07 DIAGNOSIS — Z992 Dependence on renal dialysis: Secondary | ICD-10-CM | POA: Diagnosis not present

## 2024-09-07 DIAGNOSIS — N186 End stage renal disease: Secondary | ICD-10-CM | POA: Diagnosis not present

## 2024-09-07 DIAGNOSIS — G8194 Hemiplegia, unspecified affecting left nondominant side: Secondary | ICD-10-CM | POA: Diagnosis not present

## 2024-09-07 DIAGNOSIS — I6930 Unspecified sequelae of cerebral infarction: Secondary | ICD-10-CM

## 2024-09-07 DIAGNOSIS — Z113 Encounter for screening for infections with a predominantly sexual mode of transmission: Secondary | ICD-10-CM

## 2024-09-07 DIAGNOSIS — F0390 Unspecified dementia without behavioral disturbance: Secondary | ICD-10-CM

## 2024-09-07 DIAGNOSIS — Z794 Long term (current) use of insulin: Secondary | ICD-10-CM | POA: Diagnosis not present

## 2024-09-07 DIAGNOSIS — I5020 Unspecified systolic (congestive) heart failure: Secondary | ICD-10-CM

## 2024-09-07 DIAGNOSIS — Z931 Gastrostomy status: Secondary | ICD-10-CM

## 2024-09-07 LAB — BASIC METABOLIC PANEL WITH GFR
BUN: 21 mg/dL (ref 6–23)
CO2: 36 meq/L — ABNORMAL HIGH (ref 19–32)
Calcium: 9.3 mg/dL (ref 8.4–10.5)
Chloride: 91 meq/L — ABNORMAL LOW (ref 96–112)
Creatinine, Ser: 2.56 mg/dL — ABNORMAL HIGH (ref 0.40–1.20)
GFR: 17.53 mL/min — ABNORMAL LOW
Glucose, Bld: 164 mg/dL — ABNORMAL HIGH (ref 70–99)
Potassium: 4.2 meq/L (ref 3.5–5.1)
Sodium: 134 meq/L — ABNORMAL LOW (ref 135–145)

## 2024-09-07 MED ORDER — INSULIN GLARGINE 100 UNIT/ML SOLOSTAR PEN: 10.0000 [IU] | PEN_INJECTOR | Freq: Every day | SUBCUTANEOUS | Status: AC

## 2024-09-07 MED ORDER — ATORVASTATIN CALCIUM 10 MG PO TABS: 10.0000 mg | ORAL_TABLET | Freq: Every day | ORAL | 0 refills | Status: AC

## 2024-09-07 NOTE — Progress Notes (Signed)
 "  Careteam: Patient Care Team: Sherlynn Madden, MD as PCP - General (Internal Medicine)  Allergies[1]  Chief Complaint  Patient presents with   Follow-up    Hospital follow up for low bp.    Discussed the use of AI scribe software for clinical note transcription with the patient, who gave verbal consent to proceed.  History of Present Illness    Samantha [name] is a 79 year old female with diabetes and hypertension who presents for follow-up after a recent hospitalization.  She was hospitalized after Christmas due to hypoglycemia, during which her insulin  was withheld. Her caregiver monitors her blood sugar three times daily.  She has a history of hypertension, with today's blood pressure recorded at 144/102 mmHg. She takes carvedilol  on dialysis days, and her blood pressure remains stable on those days without low readings since her hospital discharge.  She undergoes dialysis three times a week on Monday, Wednesday, and Friday. She is making some urine without pain and has regular bowel movements without blood.  She is non-ambulatory and requires assistance with daily activities such as bathing, food preparation, and laundry. Her diet includes oatmeal for breakfast, and she sometimes has difficulty eating during the day, particularly before dialysis due to the timing of her feeds and transportation schedule. No breathing difficulties, dizziness, or stomach pain.  Review of Systems:  Review of Systems  Constitutional:  Negative for chills and fever.  HENT:  Negative for congestion and sore throat.   Respiratory:  Negative for cough, sputum production and shortness of breath.   Cardiovascular:  Negative for chest pain, palpitations and leg swelling.  Gastrointestinal:  Negative for abdominal pain, heartburn and nausea.  Genitourinary:  Negative for dysuria, frequency and hematuria.  Musculoskeletal:  Negative for falls and myalgias.  Neurological:  Negative for dizziness.    Negative unless indicated in HPI.   Patient Active Problem List   Diagnosis Date Noted   Left hemiparesis (HCC) 09/07/2024   Hypocalcemia 08/18/2024   Coronary artery disease involving native coronary artery of native heart without angina pectoris 04/12/2024   Nonrheumatic mitral valve regurgitation 04/12/2024   Cardiomyopathy (HCC) 04/12/2024   Labial abscess 12/07/2023   PEG tube malfunction (HCC) 09/25/2023   Fecal impaction (HCC) 09/25/2023   Stercoral colitis 09/25/2023   History of CVA with residual deficit 09/25/2023   Heart failure with reduced ejection fraction (HCC) 09/25/2023   Well woman exam with routine gynecological exam 08/10/2023   ESRD (end stage renal disease) (HCC) 05/20/2023   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) 05/20/2023   Pressure injury of skin 04/06/2023   Fever 03/31/2023   Constipation 03/31/2023   Epigastric pain 12/13/2022   Failure to thrive in adult 12/13/2022   DM2 (diabetes mellitus, type 2) (HCC) 12/13/2022   Essential hypertension 12/13/2022   History of DVT (deep vein thrombosis) 12/13/2022   Acute cholecystitis 12/12/2022   Diabetic peripheral neuropathy (HCC) 07/12/2019   Dementia due to Alzheimer's disease (HCC) 07/12/2019   Past Medical History:  Diagnosis Date   Allergy    Anemia    Arthritis    Cerebrovascular accident (CVA) due to occlusion of cerebral artery (HCC) 07/12/2019   CHF (congestive heart failure) (HCC)    CVA (cerebral vascular accident) (HCC)    residual left sided weakness   Dementia (HCC)    Diabetes (HCC)    Diabetes mellitus without complication (HCC)    ESRD (end stage renal disease) on dialysis (HCC)    Heart  failure (HCC)    History of CT scan    History of heart attack    History of MRI    History of stroke    HTN (hypertension)    Kidney failure    Osteoporosis    PEG (percutaneous endoscopic gastrostomy) status (HCC)    Sepsis (HCC) 04/01/2023    SIRS (systemic inflammatory response syndrome) (HCC) 04/06/2023   Thyroid  disease    Past Surgical History:  Procedure Laterality Date   ABDOMINAL HYSTERECTOMY     GALLBLADDER SURGERY     IR EXCHANGE BILIARY DRAIN  02/11/2023   IR EXCHANGE BILIARY DRAIN  04/13/2023   IR EXCHANGE BILIARY DRAIN  06/15/2023   IR EXCHANGE BILIARY DRAIN  08/23/2023   IR EXCHANGE BILIARY DRAIN  10/05/2023   IR EXCHANGE BILIARY DRAIN  11/30/2023   IR EXCHANGE BILIARY DRAIN  01/25/2024   IR EXCHANGE BILIARY DRAIN  03/21/2024   IR EXCHANGE BILIARY DRAIN  05/04/2024   IR EXCHANGE BILIARY DRAIN  06/29/2024   IR EXCHANGE BILIARY DRAIN  08/03/2024   IR PERC CHOLECYSTOSTOMY  12/16/2022   IR REPLACE G-TUBE SIMPLE WO FLUORO  08/23/2023   IR REPLC GASTRO/COLONIC TUBE PERCUT W/FLUORO  06/15/2023   IR REPLC GASTRO/COLONIC TUBE PERCUT W/FLUORO  10/05/2023   IR REPLC GASTRO/COLONIC TUBE PERCUT W/FLUORO  11/30/2023   IR REPLC GASTRO/COLONIC TUBE PERCUT W/FLUORO  03/21/2024   IR REPLC GASTRO/COLONIC TUBE PERCUT W/FLUORO  08/03/2024   Social History[2] Family History  Problem Relation Age of Onset   Diabetes Mother    Allergies[3]  Medications: Patient's Medications  New Prescriptions   No medications on file  Previous Medications   ACETAMINOPHEN  (TYLENOL ) 500 MG TABLET    Take 500 mg by mouth daily as needed for mild pain (pain score 1-3).   CARVEDILOL  (COREG ) 3.125 MG TABLET    Take 3.125 mg Q AM  and 3.125 mg Q PM on non dialysis days (Tues, Thurs, Sat and Sun,  - On dialysis days,  MWF, continue Coreg  3.125 mg Q PM -  Hold for SBP < 100   COVID-19 MRNA VACCINE, MODERNA, >/= 61YRS, (SPIKEVAX ) INJECTION    Inject 0.5 mLs into the muscle as directed.   ELIQUIS  2.5 MG TABS TABLET    Take 1 tablet by mouth twice daily   GLUCAGON (BAQSIMI ONE PACK) 3 MG/DOSE POWD    Place 1 packet into the nose as needed (For blood sugar). Use when blood sugar drops below 70.   INSULIN  ASPART (NOVOLOG ) 100 UNIT/ML INJECTION    0-9 Units,  Subcutaneous, 3 times daily with meals, CBG < 70: Implement Hypoglycemia measures CBG 70 - 120: 0 units CBG 121 - 150: 1 unit CBG 151 - 200: 2 units CBG 201 - 250: 3 units CBG 251 - 300: 5 units CBG 301 - 350: 7 units CBG 351 - 400: 9 units CBG > 400: call MD   LIVER OIL-ZINC  OXIDE (DESITIN) 40 % OINTMENT    Apply topically 2 (two) times daily.   LOKELMA 10 G PACK PACKET    Take 1 packet by mouth as directed. Taken tues, thur, sat   METHOXY PEG-EPOETIN  BETA (MIRCERA IJ)    75 mcg as directed. Taken at dialysis   MULTIPLE VITAMINS-MINERALS (MULTIVITAMIN WITH MINERALS) TABLET    Take 1 tablet by mouth at bedtime.   NUTRITIONAL SUPPLEMENTS (FEEDING SUPPLEMENT, OSMOLITE 1.5 CAL,) LIQD    Place 600 mLs into feeding tube See admin instructions. 8p-6a   POLYETHYLENE GLYCOL  POWDER (MIRALAX ) 17 GM/SCOOP POWDER    Take 17 g by mouth 2 (two) times daily as needed for moderate constipation or mild constipation.   SENNA (SENOKOT) 8.6 MG TABS TABLET    Take 1 tablet by mouth at bedtime.   SIMETHICONE (MYLICON) 125 MG CHEWABLE TABLET    Chew 125 mg by mouth 2 (two) times daily after a meal.  Modified Medications   Modified Medication Previous Medication   ATORVASTATIN  (LIPITOR) 10 MG TABLET atorvastatin  (LIPITOR) 10 MG tablet      Take 1 tablet (10 mg total) by mouth daily.    Take 1 tablet by mouth once daily   INSULIN  GLARGINE (LANTUS ) 100 UNIT/ML SOLOSTAR PEN insulin  glargine (LANTUS ) 100 UNIT/ML Solostar Pen      Inject 10 Units into the skin at bedtime.    Inject 14 Units into the skin at bedtime.  Discontinued Medications   No medications on file    Physical Exam: Vitals:   09/07/24 0852  BP: (!) 144/102  Pulse: 86  SpO2: 97%   There is no height or weight on file to calculate BMI. BP Readings from Last 3 Encounters:  09/07/24 (!) 144/102  08/20/24 (!) 142/75  07/25/24 129/80   Wt Readings from Last 3 Encounters:  08/20/24 129 lb 10.1 oz (58.8 kg)  04/13/24 130 lb (59 kg)  03/05/24 128 lb  (58.1 kg)    Physical Exam Constitutional:      Appearance: Normal appearance.  HENT:     Head: Normocephalic and atraumatic.  Cardiovascular:     Rate and Rhythm: Normal rate and regular rhythm.  Pulmonary:     Effort: Pulmonary effort is normal. No respiratory distress.     Breath sounds: Normal breath sounds. No wheezing.  Abdominal:     General: Bowel sounds are normal. There is no distension.     Tenderness: There is no abdominal tenderness. There is no guarding or rebound.     Comments: Peg tube in place No tenderness    Musculoskeletal:        General: No swelling or tenderness.  Neurological:     Mental Status: She is alert. Mental status is at baseline.     Motor: No weakness.     Labs reviewed: Basic Metabolic Panel: Recent Labs    09/27/23 0406 09/27/23 0407 12/08/23 0357 12/09/23 0335 03/05/24 1939 08/18/24 1642 08/18/24 1727 08/18/24 1842 08/19/24 0242 08/19/24 1005 08/20/24 1127  NA  --    < > 131* 133*   < > 134* 135  135  --  133*  --  129*  K  --    < > 4.2 4.0   < > 4.0 4.0  4.0  --  4.5  --  5.2*  CL  --    < > 95* 94*   < > 93* 93*  --  95*  --  92*  CO2  --    < > 27 25   < > 29  --   --  27  --  23  GLUCOSE  --    < > 278* 276*   < > 167* 169*  --  59*  --  232*  BUN  --    < > 13 10   < > 9 10  --  15  --  31*  CREATININE  --    < > 2.48* 2.21*   < > 1.60* 1.70*  --  2.16*  --  3.11*  CALCIUM   --    < > 8.9 8.7*   < > 8.3*  --   --  9.4  --  9.2  MG 2.1  --   --   --   --   --   --  2.0  --   --  2.2  PHOS  --    < > 3.4 3.0  --   --   --  2.7  --   --   --   TSH  --   --  3.990  --   --   --   --   --   --  3.440  --    < > = values in this interval not displayed.   Liver Function Tests: Recent Labs    03/05/24 1939 07/12/24 0000 08/18/24 1642 08/20/24 1127  AST 25  --  39 33  ALT 15  --  16 20  ALKPHOS 232* 138* 197* 153*  BILITOT 0.9  --  0.5 0.2  PROT 6.7  --  7.8 6.2*  ALBUMIN 3.5 3.8 4.1 3.4*   Recent Labs     09/25/23 0206  LIPASE 60*   Recent Labs    08/19/24 1005  AMMONIA 37*   CBC: Recent Labs    07/12/24 0000 08/18/24 1642 08/18/24 1727 08/19/24 0242 08/20/24 1127  WBC 7.2 7.5  --  7.4 7.1  NEUTROABS 4,133.00 5.4  --   --  4.2  HGB 10.7* 12.5 14.3  13.6 10.7* 11.1*  HCT 32* 38.7 42.0  40.0 33.9* 34.3*  MCV  --  95.8  --  97.7 95.8  PLT  --  181  --  143* 144*   Lipid Panel: No results for input(s): CHOL, HDL, LDLCALC, TRIG, CHOLHDL, LDLDIRECT in the last 8760 hours. TSH: Recent Labs    12/08/23 0357 08/19/24 1005  TSH 3.990 3.440   A1C: Lab Results  Component Value Date   HGBA1C 7.1 (A) 07/25/2024   HGBA1C 7.1 07/25/2024   HGBA1C 7.1 (A) 07/25/2024   HGBA1C 7.1 (A) 07/25/2024    Assessment & Plan Type 2 diabetes mellitus with chronic kidney disease on chronic dialysis, with long-term current use of insulin  (HCC) Pt had low BG during her hospital stay  Will decrease lantus  to 10 units from 14 Cont with sliding scale Monitor bg and keep a log Orders:   atorvastatin  (LIPITOR) 10 MG tablet; Take 1 tablet (10 mg total) by mouth daily.   Ambulatory referral to Ophthalmology  Left hemiparesis (HCC)  Late effects of CVA (cerebrovascular accident)   Pt is alert and oriented x 2  No change from baseline as per family Refilled lipitor Orders:   atorvastatin  (LIPITOR) 10 MG tablet; Take 1 tablet (10 mg total) by mouth daily.  Major neurocognitive disorder (HCC) No behavioral manifestations No sacral wounds Pt is non ambulatory  Has HH and a very supportive family    Hyponatremia  Orders:   Basic Metabolic Panel (BMET)  Feeding by G-tube (HCC) Feeding tube in place No redness Cont with nocturnal feeding    Heart failure with reduced ejection fraction (HCC) Lungs clear  No lower extremity swelling Avoid salty foods    Screening for STD (sexually transmitted disease)  Orders:   Hepatitis C Antibody   Return in about 3 months  (around 12/06/2024).:   Samantha [name]     [1]  Allergies Allergen Reactions   Asa [Aspirin] Anaphylaxis   Penicillins Anaphylaxis    **Tolerates  cephalosporins  [2]  Social History Tobacco Use   Smoking status: Never   Smokeless tobacco: Never  Vaping Use   Vaping status: Never Used  Substance Use Topics   Alcohol use: Not Currently   Drug use: Never  [3]  Allergies Allergen Reactions   Asa [Aspirin] Anaphylaxis   Penicillins Anaphylaxis    **Tolerates cephalosporins   "

## 2024-09-07 NOTE — Assessment & Plan Note (Addendum)
 Lungs clear  No lower extremity swelling Avoid salty foods

## 2024-09-07 NOTE — Assessment & Plan Note (Deleted)
 Samantha [name]

## 2024-09-07 NOTE — Patient Instructions (Addendum)
 Decrease lantus  to 10 units from 14 units Monitor Blood sugars thrice daily and keep a log

## 2024-09-07 NOTE — Assessment & Plan Note (Addendum)
 Pt had low BG during her hospital stay  Will decrease lantus  to 10 units from 14 Cont with sliding scale Monitor bg and keep a log Orders:   atorvastatin  (LIPITOR) 10 MG tablet; Take 1 tablet (10 mg total) by mouth daily.   Ambulatory referral to Ophthalmology

## 2024-09-08 ENCOUNTER — Encounter: Payer: Self-pay | Admitting: Sports Medicine

## 2024-09-08 ENCOUNTER — Ambulatory Visit: Payer: Self-pay | Admitting: Sports Medicine

## 2024-09-08 ENCOUNTER — Emergency Department (HOSPITAL_COMMUNITY)
Admission: EM | Admit: 2024-09-08 | Discharge: 2024-09-08 | Disposition: A | Discharge status: Home / Self Care | Attending: Emergency Medicine | Admitting: Emergency Medicine

## 2024-09-08 ENCOUNTER — Other Ambulatory Visit: Payer: Self-pay

## 2024-09-08 ENCOUNTER — Emergency Department (HOSPITAL_COMMUNITY)

## 2024-09-08 DIAGNOSIS — R55 Syncope and collapse: Secondary | ICD-10-CM | POA: Insufficient documentation

## 2024-09-08 DIAGNOSIS — Z992 Dependence on renal dialysis: Secondary | ICD-10-CM | POA: Diagnosis not present

## 2024-09-08 DIAGNOSIS — Z7901 Long term (current) use of anticoagulants: Secondary | ICD-10-CM | POA: Diagnosis not present

## 2024-09-08 DIAGNOSIS — Z794 Long term (current) use of insulin: Secondary | ICD-10-CM | POA: Diagnosis not present

## 2024-09-08 DIAGNOSIS — N186 End stage renal disease: Secondary | ICD-10-CM | POA: Diagnosis not present

## 2024-09-08 DIAGNOSIS — R7689 Other specified abnormal immunological findings in serum: Secondary | ICD-10-CM

## 2024-09-08 LAB — COMPREHENSIVE METABOLIC PANEL WITH GFR
ALT: 23 U/L (ref 0–44)
AST: 44 U/L — ABNORMAL HIGH (ref 15–41)
Albumin: 4.1 g/dL (ref 3.5–5.0)
Alkaline Phosphatase: 175 U/L — ABNORMAL HIGH (ref 38–126)
Anion gap: 11 (ref 5–15)
BUN: 12 mg/dL (ref 8–23)
CO2: 31 mmol/L (ref 22–32)
Calcium: 8.7 mg/dL — ABNORMAL LOW (ref 8.9–10.3)
Chloride: 93 mmol/L — ABNORMAL LOW (ref 98–111)
Creatinine, Ser: 1.89 mg/dL — ABNORMAL HIGH (ref 0.44–1.00)
GFR, Estimated: 27 mL/min — ABNORMAL LOW
Glucose, Bld: 142 mg/dL — ABNORMAL HIGH (ref 70–99)
Potassium: 4.5 mmol/L (ref 3.5–5.1)
Sodium: 135 mmol/L (ref 135–145)
Total Bilirubin: 0.5 mg/dL (ref 0.0–1.2)
Total Protein: 7.8 g/dL (ref 6.5–8.1)

## 2024-09-08 LAB — CBC WITH DIFFERENTIAL/PLATELET
Abs Immature Granulocytes: 0.05 K/uL (ref 0.00–0.07)
Basophils Absolute: 0 K/uL (ref 0.0–0.1)
Basophils Relative: 0 %
Eosinophils Absolute: 0.1 K/uL (ref 0.0–0.5)
Eosinophils Relative: 1 %
HCT: 37.3 % (ref 36.0–46.0)
Hemoglobin: 12.2 g/dL (ref 12.0–15.0)
Immature Granulocytes: 1 %
Lymphocytes Relative: 17 %
Lymphs Abs: 1.5 K/uL (ref 0.7–4.0)
MCH: 30.8 pg (ref 26.0–34.0)
MCHC: 32.7 g/dL (ref 30.0–36.0)
MCV: 94.2 fL (ref 80.0–100.0)
Monocytes Absolute: 0.9 K/uL (ref 0.1–1.0)
Monocytes Relative: 10 %
Neutro Abs: 6.1 K/uL (ref 1.7–7.7)
Neutrophils Relative %: 71 %
Platelets: 174 K/uL (ref 150–400)
RBC: 3.96 MIL/uL (ref 3.87–5.11)
RDW: 15.4 % (ref 11.5–15.5)
WBC: 8.7 K/uL (ref 4.0–10.5)
nRBC: 0 % (ref 0.0–0.2)

## 2024-09-08 LAB — CBG MONITORING, ED: Glucose-Capillary: 132 mg/dL — ABNORMAL HIGH (ref 70–99)

## 2024-09-08 MED ORDER — MIDODRINE HCL 5 MG PO TABS: 5.0000 mg | ORAL_TABLET | Freq: Once | ORAL | 0 refills | Status: AC

## 2024-09-08 NOTE — ED Triage Notes (Signed)
 Patient BIB by [facility] from hemodialysis. EMS reported  she had a syncope episode lasted 5 minutes. Per ems she would not respond to verbal or painful stimuli. Before the syncope episode her systolic blood pressure was 114 and after it was 124. Patient has weakness on left side.  Hx of prior stroke and dementia. At dialysis she had she had 1 hour and 26 mins left and 1.3 liters was pull off. Normally take off 2 Liters. A & x 1. Vital signs: Bp 132/68, HR 82, Spo2 99% RA, CBG 157

## 2024-09-08 NOTE — ED Provider Notes (Signed)
" °  Physical Exam  BP 118/81   Pulse 73   Temp 97.9 F (36.6 C) (Oral)   Resp 18   SpO2 100%   Physical Exam  Procedures  Procedures  ED Course / MDM   Clinical Course as of 09/09/24 1633  Fri Sep 08, 2024  1620 Assumed care from Dr Garrick. 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 Mentating well now.  Nonfocal neuroexam.  Here recently for the same.  EKG without acute findings. [RP]  1826 Labs have returned.  Creatinine improved from prior.  Potassium 4.5.  CBC unremarkable.  Chest x-ray without acute findings.  Patient reassessed and she is at her baseline at this point in time. Non-focal neurologic exam for me as well.  [RP]  1830 Discussed results with Delon Terry Dawes (daughter). Suspect that she got hypotensive during dialysis and passed out.  Discharged home with prescription of midodrine  to take during dialysis since this appears to be a recurring event.  Will have her follow-up with her primary doctor in several days. [RP]    Clinical Course User Index [RP] Yolande Lamar BROCKS, MD   Medical Decision Making Amount and/or Complexity of Data Reviewed Labs: ordered. Radiology: ordered.  Risk Prescription drug management.       Yolande Lamar BROCKS, MD 09/09/24 [phone]  "

## 2024-09-08 NOTE — ED Notes (Signed)
 Repositioned and cleaned PT from incontinence.

## 2024-09-08 NOTE — ED Notes (Signed)
 2 blankets given to the patient.

## 2024-09-08 NOTE — Discharge Instructions (Signed)
 You were seen for passing out during dialysis in the emergency department.   At home, please PLEASE MAKE SURE TO TAKE YOUR MIDODRINE  WITH DIALYSIS.    Check your MyChart online for the results of any tests that had not resulted by the time you left the emergency department.   Follow-up with your primary doctor in 2-3 days regarding your visit.    Return immediately to the emergency department if you experience any of the following: Recurrent fainting spells, chest pain, shortness of breath, or any other concerning symptoms.    Thank you for visiting our Emergency Department. It was a pleasure taking care of you today.

## 2024-09-08 NOTE — ED Notes (Signed)
 PTAR scheduled for the patient to return to [address] [place], [place], [place] [address].  RN made aware of transport status. No ETA available at this time.

## 2024-09-11 ENCOUNTER — Telehealth: Payer: Self-pay

## 2024-09-11 DIAGNOSIS — N186 End stage renal disease: Secondary | ICD-10-CM

## 2024-09-11 NOTE — Telephone Encounter (Signed)
 fyi

## 2024-09-11 NOTE — Telephone Encounter (Signed)
 Copied from CRM #[id number]. Topic: General - Other >> Sep 11, 2024 11:15 AM Alfonso HERO wrote: Reason for CRM: patient daughter calling to inform the patient had Hep C in the past but it was in remission. If you need to discuss this please call Delon at [phone]. She wants to make sure you all have her medical history from prior care.

## 2024-09-12 ENCOUNTER — Other Ambulatory Visit: Payer: Self-pay | Admitting: Interventional Radiology

## 2024-09-12 DIAGNOSIS — K8020 Calculus of gallbladder without cholecystitis without obstruction: Secondary | ICD-10-CM

## 2024-09-12 NOTE — Telephone Encounter (Signed)
 No further action needed at this time.

## 2024-09-13 LAB — HCV RNA,QUANTITATIVE REAL TIME PCR
HCV Quantitative Log: <1.18 {Log_IU}/mL
HCV RNA, PCR, QN: <15 [IU]/mL

## 2024-09-13 LAB — HEPATITIS C ANTIBODY: Hepatitis C Ab: REACTIVE — AB

## 2024-09-14 ENCOUNTER — Encounter: Payer: Self-pay | Admitting: Sports Medicine

## 2024-09-14 ENCOUNTER — Telehealth: Payer: Self-pay

## 2024-09-14 ENCOUNTER — Telehealth: Admitting: Sports Medicine

## 2024-09-14 NOTE — Telephone Encounter (Signed)
 Called pts daughter Samantha [name] to give results. Pts daughter aware and verbalized understanding.

## 2024-09-14 NOTE — Telephone Encounter (Signed)
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 I would recommend checking BP daily around the same time, bring the bp log, bo machine at next visit Will see her in person next week  in clinic

## 2024-09-14 NOTE — Progress Notes (Signed)
"   Pt could not connect virtually  Pt had ED visit for passing out during dialysis Reviewed lasb    Latest Ref Rng & Units 09/08/2024    3:33 PM 08/20/2024   11:27 AM 08/19/2024    2:42 AM  CBC  WBC 4.0 - 10.5 K/uL 8.7  7.1  7.4   Hemoglobin 12.0 - 15.0 g/dL 87.7  88.8  89.2   Hematocrit 36.0 - 46.0 % 37.3  34.3  33.9   Platelets 150 - 400 K/uL 174  144  143        Latest Ref Rng & Units 09/08/2024    3:33 PM 09/07/2024    9:27 AM 08/20/2024   11:27 AM  BMP  Glucose 70 - 99 mg/dL 857  835  767   BUN 8 - 23 mg/dL 12  21  31    Creatinine 0.44 - 1.00 mg/dL 8.10  7.43  6.88   Sodium 135 - 145 mmol/L 135  134  129   Potassium 3.5 - 5.1 mmol/L 4.5  4.2  5.2   Chloride 98 - 111 mmol/L 93  91  92   CO2 22 - 32 mmol/L 31  36  23   Calcium  8.9 - 10.3 mg/dL 8.7  9.3  9.2      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

## 2024-09-19 ENCOUNTER — Telehealth (HOSPITAL_COMMUNITY): Payer: Self-pay | Admitting: Radiology

## 2024-09-19 ENCOUNTER — Other Ambulatory Visit (HOSPITAL_COMMUNITY): Payer: Self-pay | Admitting: Interventional Radiology

## 2024-09-19 DIAGNOSIS — K8020 Calculus of gallbladder without cholecystitis without obstruction: Secondary | ICD-10-CM

## 2024-09-19 NOTE — Telephone Encounter (Signed)
 Called pt's daughter Jesusa) back to make sure that the family was OK with patient getting moderate sedation on 2/5 for a cholangiogram and drain exchange. The daughter still is unsure if they are going to be in agreement with this. She states that she will call me back by this Friday and let me know. Otherwise, this will just be a routine exchange. JM

## 2024-09-21 NOTE — ED Provider Notes (Signed)
 " [facility] DEPART[facility] AT [facility] Provider Note   CSN: [id number] Arrival date & time: 09/08/24  1428     Patient presents with: Loss of Consciousness   Samantha [name] is a 79 y.o. female.   HPI Late note addition  Patient presents from dialysis after episode of near syncope.  She was well prior to dialysis, notes that she was hospitalized last year but has been doing generally well. She is on midodrine  for hypotension. She currently has no complaints, does not recall chest pain before or after the event.  EMS reports mild changes in blood pressure with increase following return to interactivity, incomplete dialysis session    Prior to Admission medications  Medication Sig Start Date End Date Taking? Authorizing Provider  acetaminophen  (TYLENOL ) 500 MG tablet Take 500 mg by mouth daily as needed for mild pain (pain score 1-3).    [provider]  atorvastatin  (LIPITOR) 10 MG tablet Take 1 tablet (10 mg total) by mouth daily. 09/07/24   Sherlynn Madden, MD  carvedilol  (COREG ) 3.125 MG tablet Take 3.125 mg Q AM  and 3.125 mg Q PM on non dialysis days (Tues, Thurs, Sat and Sun,  - On dialysis days,  MWF, continue Coreg  3.125 mg Q PM -  Hold for SBP < 100 05/02/24   Sherlynn Madden, MD  COVID-19 mRNA vaccine, Moderna, >/= 103yrs, (SPIKEVAX ) injection Inject 0.5 mLs into the muscle as directed. 05/02/24   Sherlynn Madden, MD  ELIQUIS  2.5 MG TABS tablet Take 1 tablet by mouth twice daily 08/14/24   Veludandi, Prashanthi, MD  Glucagon (BAQSIMI ONE PACK) 3 MG/DOSE POWD Place 1 packet into the nose as needed (For blood sugar). Use when blood sugar drops below 70.    [provider]  insulin  aspart (NOVOLOG ) 100 UNIT/ML injection 0-9 Units, Subcutaneous, 3 times daily with meals, CBG < 70: Implement Hypoglycemia measures CBG 70 - 120: 0 units CBG 121 - 150: 1 unit CBG 151 - 200: 2 units CBG 201 - 250: 3 units CBG 251 - 300: 5 units CBG 301 -  350: 7 units CBG 351 - 400: 9 units CBG > 400: call MD 12/25/22   Raenelle Donalda HERO, MD  insulin  glargine (LANTUS ) 100 UNIT/ML Solostar Pen Inject 10 Units into the skin at bedtime. 09/07/24   Sherlynn Madden, MD  liver oil-zinc  oxide (DESITIN) 40 % ointment Apply topically 2 (two) times daily. 08/20/24   Patel, Pranav M, MD  LOKELMA 10 g PACK packet Take 1 packet by mouth as directed. Taken tues, thur, sat 04/12/24   [provider]  Methoxy PEG-Epoetin  Beta (MIRCERA IJ) 75 mcg as directed. Taken at dialysis 03/06/24 03/19/25  [provider]  Multiple Vitamins-Minerals (MULTIVITAMIN WITH MINERALS) tablet Take 1 tablet by mouth at bedtime.    [provider]  Nutritional Supplements (FEEDING SUPPLEMENT, OSMOLITE 1.5 CAL,) LIQD Place 600 mLs into feeding tube See admin instructions. 8p-6a    [provider]  polyethylene glycol powder (MIRALAX ) 17 GM/SCOOP powder Take 17 g by mouth 2 (two) times daily as needed for moderate constipation or mild constipation. 12/09/23   Gonfa, Taye T, MD  senna (SENOKOT) 8.6 MG TABS tablet Take 1 tablet by mouth at bedtime.    [provider]  simethicone (MYLICON) 125 MG chewable tablet Chew 125 mg by mouth 2 (two) times daily after a meal.    [provider]    Allergies: Asa [aspirin] and Penicillins    Review  of Systems  Updated Vital Signs BP 118/81   Pulse 73   Temp 97.9 F (36.6 C) (Oral)   Resp 18   SpO2 100%   Physical Exam Vitals and nursing note reviewed.  Constitutional:      General: She is not in acute distress.    Appearance: She is well-developed.  HENT:     Head: Normocephalic and atraumatic.  Eyes:     Conjunctiva/sclera: Conjunctivae normal.  Cardiovascular:     Rate and Rhythm: Normal rate and regular rhythm.  Pulmonary:     Effort: Pulmonary effort is normal. No respiratory distress.     Breath sounds: No stridor.  Abdominal:     General: There is no distension.  Skin:     General: Skin is warm and dry.  Neurological:     Mental Status: She is alert.     Comments: Patient with known left-sided weakness from prior stroke  Psychiatric:        Mood and Affect: Mood normal.     (all labs ordered are listed, but only abnormal results are displayed) Labs Reviewed  COMPREHENSIVE METABOLIC PANEL WITH GFR - Abnormal; Notable for the following components:      Result Value   Chloride 93 (*)    Glucose, Bld 142 (*)    Creatinine, Ser 1.89 (*)    Calcium  8.7 (*)    AST 44 (*)    Alkaline Phosphatase 175 (*)    GFR, Estimated 27 (*)    All other components within normal limits  CBG MONITORING, ED - Abnormal; Notable for the following components:   Glucose-Capillary 132 (*)    All other components within normal limits  CBC WITH DIFFERENTIAL/PLATELET    EKG: EKG Interpretation Date/Time:  Friday September 08 2024 15:08:34 EST Ventricular Rate:  79 PR Interval:    QRS Duration:  98 QT Interval:  460 QTC Calculation: 528 R Axis:   -62  Text Interpretation: Normal sinus rhythm Left anterior fascicular block Low voltage, precordial leads Consider anterior infarct baseline artifact obscures interpretation Confirmed by Yolande Charleston [phone]) on 09/08/2024 5:36:18 PM  Radiology: No results found.   Procedures   Medications Ordered in the ED - No data to display  Clinical Course as of 09/21/24 0706  Fri Sep 08, 2024  1620 Assumed care from Dr Garrick. 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 Mentating well now.  Nonfocal neuroexam.  Here recently for the same.  EKG without acute findings. [RP]  1826 Labs have returned.  Creatinine improved from prior.  Potassium 4.5.  CBC unremarkable.  Chest x-ray without acute findings.  Patient reassessed and she is at her baseline at this point in time. Non-focal neurologic exam for me as well.  [RP]  1830 Discussed results with Delon Terry Dawes (daughter). Suspect that she got hypotensive  during dialysis and passed out.  Discharged home with prescription of midodrine  to take during dialysis since this appears to be a recurring event.  Will have her follow-up with her primary doctor in several days. [RP]    Clinical Course User Index [RP] Yolande Charleston BROCKS, MD                                 Medical Decision Making Elderly female, end-stage renal disease presents after episode of near syncope, possible syncope at dialysis.  Patient is now seemingly at baseline, has  no complaints.  Differential includes electrolyte abnormalities, dialysis associated hypotension, less likely sustained arrhythmia, ACS, initial vitals reassuring.  Amount and/or Complexity of Data Reviewed Independent Historian: EMS    Details: As above Labs: ordered. Radiology: ordered.  Risk Prescription drug management. Decision regarding hospitalization. Diagnosis or treatment significantly limited by social determinants of health.   On signout patient awaiting lab results, repeat evaluation.  On chart review patient's electrolytes essentially within normal, she had no additional episodes in the ED was discharged after conversation with family.     Final diagnoses:  Syncope, unspecified syncope type    ED Discharge Orders          Ordered    midodrine  (PROAMATINE ) 5 MG tablet  Once in dialysis        09/08/24 1839               Garrick Charleston, MD 09/21/24 [phone]  "

## 2024-09-27 ENCOUNTER — Other Ambulatory Visit (HOSPITAL_COMMUNITY): Payer: Self-pay | Admitting: Radiology

## 2024-09-27 DIAGNOSIS — K810 Acute cholecystitis: Secondary | ICD-10-CM

## 2024-09-28 ENCOUNTER — Ambulatory Visit
Admission: RE | Admit: 2024-09-28 | Discharge: 2024-09-28 | Disposition: A | Source: Ambulatory Visit | Attending: Interventional Radiology

## 2024-09-28 ENCOUNTER — Other Ambulatory Visit (HOSPITAL_COMMUNITY): Payer: Self-pay | Admitting: Interventional Radiology

## 2024-09-28 ENCOUNTER — Ambulatory Visit (HOSPITAL_COMMUNITY)
Admission: RE | Admit: 2024-09-28 | Discharge: 2024-09-28 | Disposition: A | Source: Ambulatory Visit | Attending: Diagnostic Radiology | Admitting: Diagnostic Radiology

## 2024-09-28 ENCOUNTER — Other Ambulatory Visit: Payer: Self-pay

## 2024-09-28 DIAGNOSIS — K819 Cholecystitis, unspecified: Secondary | ICD-10-CM

## 2024-09-28 DIAGNOSIS — K8020 Calculus of gallbladder without cholecystitis without obstruction: Secondary | ICD-10-CM

## 2024-09-28 DIAGNOSIS — K810 Acute cholecystitis: Secondary | ICD-10-CM

## 2024-09-28 LAB — GLUCOSE, CAPILLARY: Glucose-Capillary: 154 mg/dL — ABNORMAL HIGH (ref 70–99)

## 2024-09-28 MED ORDER — LIDOCAINE HCL 1 % IJ SOLN
20.0000 mL | Freq: Once | INTRAMUSCULAR | Status: AC
  Administered 2024-09-28: 20 mL

## 2024-09-28 MED ORDER — SODIUM CHLORIDE 0.9% FLUSH: 5.0000 mL | Freq: Three times a day (TID) | INTRAVENOUS | Status: DC

## 2024-09-28 MED ORDER — FENTANYL CITRATE (PF) 100 MCG/2ML IJ SOLN
INTRAMUSCULAR | Status: AC | PRN
  Administered 2024-09-28 (×3): 25 ug via INTRAVENOUS

## 2024-09-28 MED ORDER — MIDAZOLAM HCL 2 MG/2ML IJ SOLN
INTRAMUSCULAR | Status: AC
  Filled 2024-09-28: qty 2

## 2024-09-28 MED ORDER — SODIUM CHLORIDE 0.9 % IV SOLN: INTRAVENOUS | Status: DC

## 2024-09-28 MED ORDER — DIPHENHYDRAMINE HCL 50 MG/ML IJ SOLN
INTRAMUSCULAR | Status: AC | PRN
  Administered 2024-09-28: 25 mg via INTRAVENOUS

## 2024-09-28 MED ORDER — IOHEXOL 300 MG/ML  SOLN
50.0000 mL | Freq: Once | INTRAMUSCULAR | Status: AC | PRN
  Administered 2024-09-28: 15 mL

## 2024-09-28 MED ORDER — FENTANYL CITRATE (PF) 100 MCG/2ML IJ SOLN
INTRAMUSCULAR | Status: AC
  Filled 2024-09-28: qty 2

## 2024-09-28 MED ORDER — LIDOCAINE HCL 1 % IJ SOLN
INTRAMUSCULAR | Status: AC
  Filled 2024-09-28: qty 20

## 2024-09-28 MED ORDER — MIDAZOLAM HCL (PF) 2 MG/2ML IJ SOLN
INTRAMUSCULAR | Status: AC | PRN
  Administered 2024-09-28: .5 mg via INTRAVENOUS
  Administered 2024-09-28: 1 mg via INTRAVENOUS

## 2024-09-28 MED ORDER — DIPHENHYDRAMINE HCL 50 MG/ML IJ SOLN
INTRAMUSCULAR | Status: AC
  Filled 2024-09-28: qty 1

## 2024-09-28 NOTE — Procedures (Signed)
 Vascular and Interventional Radiology Procedure Note  Patient: Brittinie Orrico DOB: 11/14/1945 Medical Record Number: [id number] Note Date/Time: 09/28/24 10:02 AM   Performing Physician: Thom Hall, MD Assistant(s): None  Diagnosis: Hx of acute cholecystitis. Drain placed 12/16/22. SpyGlass evaluation.  Procedure:  CHOLECYSTOSTOMY TUBE EVALUATION and EXCHANGE ANTEROGRADE CHOLANGIOGRAM  Anesthesia: Conscious Sedation Complications: None Estimated Blood Loss: Minimal Specimens:  None  Findings:  Successful exchange for a new 49F biliary drain. Intermittently patent cystic duct, could not get trans-cystic access.  Plan: Flush tube w 5 mL sterile NS q8h and record drain output qShift.  See detailed procedure note with images in PACS. The patient tolerated the procedure well without incident or complication and was returned to Recovery in stable condition.    Thom Hall, MD [facility]   Pager. [phone] Clinic. [phone]

## 2024-09-28 NOTE — H&P (Signed)
 "     Chief Complaint: Patient was seen in consultation today for cholelithiasis  Referring Physician(s): Established patient   Supervising Physician: Hughes Simmonds  Patient Status: [facility] - Out-pt  History of Present Illness: Samantha [name] is a 79 y.o. female with a medical history significant for CVA, dementia, HTN, DM, heart failure and acute cholecystitis secondary to cholelithiasis. The patient is familiar to IR from percutaneous cholecystostomy placement 12/16/22. The patient also has a gastrostomy tube which has been exchanged in IR numerous times since 2024. The patient has undergone regular, routine cholecystostomy tube exchanges since initial drain placement. She is not a surgical candidate. Dr. Hughes recently discussed percutaneous cholangioscopy and biliary stone removal (SpyGlass) with the patient and her daughter.   The patient presents today for image-guided drain evaluation, drain exchange and cholangiogram for further evaluation prior to Spyglass procedure.   The patient's daughter, Delon, is here with her today.  Past Medical History:  Diagnosis Date   Allergy    Anemia    Arthritis    Cerebrovascular accident (CVA) due to occlusion of cerebral artery (HCC) 07/12/2019   CHF (congestive heart failure) (HCC)    CVA (cerebral vascular accident) (HCC)    residual left sided weakness   Dementia (HCC)    Diabetes (HCC)    Diabetes mellitus without complication (HCC)    ESRD (end stage renal disease) on dialysis (HCC)    Heart failure (HCC)    History of CT scan    History of heart attack    History of MRI    History of stroke    HTN (hypertension)    Kidney failure    Osteoporosis    PEG (percutaneous endoscopic gastrostomy) status (HCC)    Sepsis (HCC) 04/01/2023   SIRS (systemic inflammatory response syndrome) (HCC) 04/06/2023   Thyroid  disease     Past Surgical History:  Procedure Laterality Date   ABDOMINAL HYSTERECTOMY     GALLBLADDER SURGERY     IR  EXCHANGE BILIARY DRAIN  02/11/2023   IR EXCHANGE BILIARY DRAIN  04/13/2023   IR EXCHANGE BILIARY DRAIN  06/15/2023   IR EXCHANGE BILIARY DRAIN  08/23/2023   IR EXCHANGE BILIARY DRAIN  10/05/2023   IR EXCHANGE BILIARY DRAIN  11/30/2023   IR EXCHANGE BILIARY DRAIN  01/25/2024   IR EXCHANGE BILIARY DRAIN  03/21/2024   IR EXCHANGE BILIARY DRAIN  05/04/2024   IR EXCHANGE BILIARY DRAIN  06/29/2024   IR EXCHANGE BILIARY DRAIN  08/03/2024   IR PERC CHOLECYSTOSTOMY  12/16/2022   IR REPLACE G-TUBE SIMPLE WO FLUORO  08/23/2023   IR REPLC GASTRO/COLONIC TUBE PERCUT W/FLUORO  06/15/2023   IR REPLC GASTRO/COLONIC TUBE PERCUT W/FLUORO  10/05/2023   IR REPLC GASTRO/COLONIC TUBE PERCUT W/FLUORO  11/30/2023   IR REPLC GASTRO/COLONIC TUBE PERCUT W/FLUORO  03/21/2024   IR REPLC GASTRO/COLONIC TUBE PERCUT W/FLUORO  08/03/2024    Allergies: Asa [aspirin] and Penicillins  Medications: Prior to Admission medications  Medication Sig Start Date End Date Taking? Authorizing Provider  atorvastatin  (LIPITOR) 10 MG tablet Take 1 tablet (10 mg total) by mouth daily. 09/07/24  Yes Sherlynn Madden, MD  carvedilol  (COREG ) 3.125 MG tablet Take 3.125 mg Q AM  and 3.125 mg Q PM on non dialysis days (Tues, Thurs, Sat and Sun,  - On dialysis days,  MWF, continue Coreg  3.125 mg Q PM -  Hold for SBP < 100 05/02/24  Yes Sherlynn Madden, MD  ELIQUIS  2.5 MG TABS tablet Take 1 tablet by  mouth twice daily 08/14/24  Yes Sherlynn Madden, MD  insulin  aspart (NOVOLOG ) 100 UNIT/ML injection 0-9 Units, Subcutaneous, 3 times daily with meals, CBG < 70: Implement Hypoglycemia measures CBG 70 - 120: 0 units CBG 121 - 150: 1 unit CBG 151 - 200: 2 units CBG 201 - 250: 3 units CBG 251 - 300: 5 units CBG 301 - 350: 7 units CBG 351 - 400: 9 units CBG > 400: call MD 12/25/22  Yes Ghimire, Donalda HERO, MD  insulin  glargine (LANTUS ) 100 UNIT/ML Solostar Pen Inject 10 Units into the skin at bedtime. 09/07/24  Yes Sherlynn Madden, MD  liver  oil-zinc  oxide (DESITIN) 40 % ointment Apply topically 2 (two) times daily. 08/20/24  Yes Patel, Pranav M, MD  LOKELMA 10 g PACK packet Take 1 packet by mouth as directed. Taken tues, thur, sat 04/12/24  Yes [provider]  Methoxy PEG-Epoetin  Beta (MIRCERA IJ) 75 mcg as directed. Taken at dialysis 03/06/24 03/19/25 Yes [provider]  Multiple Vitamins-Minerals (MULTIVITAMIN WITH MINERALS) tablet Take 1 tablet by mouth at bedtime.   Yes [provider]  Nutritional Supplements (FEEDING SUPPLEMENT, OSMOLITE 1.5 CAL,) LIQD Place 600 mLs into feeding tube See admin instructions. 8p-6a   Yes [provider]  senna (SENOKOT) 8.6 MG TABS tablet Take 1 tablet by mouth at bedtime.   Yes [provider]  simethicone (MYLICON) 125 MG chewable tablet Chew 125 mg by mouth 2 (two) times daily after a meal.   Yes [provider]  acetaminophen  (TYLENOL ) 500 MG tablet Take 500 mg by mouth daily as needed for mild pain (pain score 1-3).    [provider]  COVID-19 mRNA vaccine, Moderna, >/= 90yrs, (SPIKEVAX ) injection Inject 0.5 mLs into the muscle as directed. 05/02/24   Veludandi, Prashanthi, MD  Glucagon (BAQSIMI ONE PACK) 3 MG/DOSE POWD Place 1 packet into the nose as needed (For blood sugar). Use when blood sugar drops below 70.    [provider]  polyethylene glycol powder (MIRALAX ) 17 GM/SCOOP powder Take 17 g by mouth 2 (two) times daily as needed for moderate constipation or mild constipation. 12/09/23   Gonfa, Taye T, MD     Family History  Problem Relation Age of Onset   Diabetes Mother     Social History   Socioeconomic History   Marital status: Single    Spouse name: Not on file   Number of children: Not on file   Years of education: Not on file   Highest education level: Some college, no degree  Occupational History   Not on file  Tobacco Use   Smoking status: Never   Smokeless tobacco: Never  Vaping Use   Vaping  status: Never Used  Substance and Sexual Activity   Alcohol use: Not Currently   Drug use: Never   Sexual activity: Not Currently  Other Topics Concern   Not on file  Social History Narrative   Tobacco use, amount per day now: N/A   Past tobacco use, amount per day: N/A   How many years did you use tobacco: N/A   Alcohol use (drinks per week): N/A   Diet: N/A   Do you drink/eat things with caffeine: N/A   Marital status:  Widow                                What year were you married? Yes   Do you live  in a house, apartment, assisted living, condo, trailer, etc.? House    Is it one or more stories? One story    How many persons live in your home? 2    Do you have pets in your home?( please list) No   Highest Level of education completed? High School    Current or past profession: Environmental Health Practitioner    Do you exercise?    N/A                              Type and how often?   Do you have a living will? No   Do you have a DNR form?    Yes                               If not, do you want to discuss one?   Do you have signed POA/HPOA forms?    No                    If so, please bring to you appointment      Do you have any difficulty bathing or dressing yourself? Yes   Do you have any difficulty preparing food or eating? Yes   Do you have any difficulty managing your medications? Yes   Do you have any difficulty managing your finances? Yes   Do you have any difficulty affording your medications? Yes   Social Drivers of Health   Tobacco Use: Low Risk (09/14/2024)   Patient History    Smoking Tobacco Use: Never    Smokeless Tobacco Use: Never    Passive Exposure: Not on file  Financial Resource Strain: Low Risk (06/20/2023)   Overall Financial Resource Strain (CARDIA)    Difficulty of Paying Living Expenses: Not hard at all  Food Insecurity: No Food Insecurity (08/19/2024)   Epic    Worried About Radiation Protection Practitioner of Food in the Last Year: Never true    Ran Out of Food in the  Last Year: Never true  Transportation Needs: No Transportation Needs (08/19/2024)   Epic    Lack of Transportation (Medical): No    Lack of Transportation (Non-Medical): No  Physical Activity: Inactive (07/06/2024)   Exercise Vital Sign    Days of Exercise per Week: 0 days    Minutes of Exercise per Session: 0 min  Stress: No Stress Concern Present (07/06/2024)   [employer] of Occupational Health - Occupational Stress Questionnaire    Feeling of Stress: Only a little  Social Connections: Moderately Isolated (08/19/2024)   Social Connection and Isolation Panel    Frequency of Communication with Friends and Family: Three times a week    Frequency of Social Gatherings with Friends and Family: Twice a week    Attends Religious Services: 1 to 4 times per year    Active Member of [employer] or Organizations: No    Attends Banker Meetings: Never    Marital Status: Widowed  Depression (PHQ2-9): Low Risk (07/25/2024)   Depression (PHQ2-9)    PHQ-2 Score: 0  Recent Concern: Depression (PHQ2-9) - Medium Risk (07/25/2024)   Depression (PHQ2-9)    PHQ-2 Score: 8  Alcohol Screen: Not on file  Housing: Low Risk (08/19/2024)   Epic    Unable to Pay for Housing in the Last Year: No    Number of Times Moved in the Last  Year: 0    Homeless in the Last Year: No  Utilities: Not At Risk (08/19/2024)   Epic    Threatened with loss of utilities: No  Health Literacy: Inadequate Health Literacy (07/06/2024)   B1300 Health Literacy    Frequency of need for help with medical instructions: Always    Review of Systems: A 12 point ROS discussed and pertinent positives are indicated in the HPI above.  All other systems are negative.  Review of Systems  Unable to perform ROS: Dementia (Dementia)   Vital Signs: BP (!) 150/72 (BP Location: Right Arm)   Pulse 86   Temp 98.9 F (37.2 C) (Oral)   Resp 18   Ht 4' 11 (1.499 m)   Wt 130 lb (59 kg)   SpO2 100%   BMI 26.26 kg/m    Physical Exam Constitutional:      General: She is not in acute distress.    Appearance: Normal appearance.  HENT:     Mouth/Throat:     Mouth: Mucous membranes are moist.  Pulmonary:     Effort: Pulmonary effort is normal. No respiratory distress.     Breath sounds: Normal breath sounds.  Neurological:     Mental Status: She is alert. Mental status is at baseline.     Comments: Patient has a history of dementia due to Alzheimer's disease  Psychiatric:        Mood and Affect: Mood normal.        Behavior: Behavior normal.    Labs:  CBC: Recent Labs    08/18/24 1642 08/18/24 1727 08/19/24 0242 08/20/24 1127 09/08/24 1533  WBC 7.5  --  7.4 7.1 8.7  HGB 12.5 14.3  13.6 10.7* 11.1* 12.2  HCT 38.7 42.0  40.0 33.9* 34.3* 37.3  PLT 181  --  143* 144* 174    COAGS: Recent Labs    12/06/23 0730 12/07/23 0408 12/07/23 1521 12/08/23 0357 08/19/24 0242  INR 1.9*  --   --   --  1.4*  APTT  --  64* >200* >200*  --     BMP: Recent Labs    08/18/24 1642 08/18/24 1727 08/19/24 0242 08/20/24 1127 09/07/24 0927 09/08/24 1533  NA 134*   < > 133* 129* 134* 135  K 4.0   < > 4.5 5.2* 4.2 4.5  CL 93*   < > 95* 92* 91* 93*  CO2 29  --  27 23 36* 31  GLUCOSE 167*   < > 59* 232* 164* 142*  BUN 9   < > 15 31* 21 12  CALCIUM  8.3*  --  9.4 9.2 9.3 8.7*  CREATININE 1.60*   < > 2.16* 3.11* 2.56* 1.89*  GFRNONAA 33*  --  23* 15*  --  27*   < > = values in this interval not displayed.    LIVER FUNCTION TESTS: Recent Labs    03/05/24 1939 07/12/24 0000 08/18/24 1642 08/20/24 1127 09/08/24 1533  BILITOT 0.9  --  0.5 0.2 0.5  AST 25  --  39 33 44*  ALT 15  --  16 20 23   ALKPHOS 232* 138* 197* 153* 175*  PROT 6.7  --  7.8 6.2* 7.8  ALBUMIN 3.5 3.8 4.1 3.4* 4.1    TUMOR MARKERS: No results for input(s): AFPTM, CEA, CA199, CHROMGRNA in the last 8760 hours.  Assessment and Plan:  Cholelithiasis with chronic cholecystostomy: Raya Rachel, 79 year old  female, presents today to the [facility] Interventional Radiology department  for an image-guided cholecystostomy drain evaluation, cholangiogram and drain exchange. Procedure will be performed under moderate sedation.   Risks and benefits discussed with the patient including bleeding, infection, damage to adjacent structures, bowel perforation/fistula connection, and sepsis.  All of the patient's questions were answered, patient is agreeable to proceed. She has been NPO.   Consent signed and in chart.   Thank you for this interesting consult.  I greatly enjoyed meeting Beza Hickel and look forward to participating in their care.  A copy of this report was sent to the requesting provider on this date.  Electronically Signed: Greig Jasmine, PA-C 09/28/2024, 9:12 AM   I spent a total of  30 Minutes   in face to face in clinical consultation, greater than 50% of which was counseling/coordinating care for cholelithiasis  "

## 2024-09-28 NOTE — Consult Note (Incomplete)
 "      Chief Complaint: Patient was seen in consultation today for consideration of Spyglass procedure at the request of Mugweru,Jon  Referring Physician(s): Mugweru,Jon  Consulting Physician: Hughes Simmonds  History of Present Illness: Samantha [name] is a 79 y.o. female ***  Past Medical History:  Diagnosis Date   Allergy    Anemia    Arthritis    Cerebrovascular accident (CVA) due to occlusion of cerebral artery (HCC) 07/12/2019   CHF (congestive heart failure) (HCC)    CVA (cerebral vascular accident) (HCC)    residual left sided weakness   Dementia (HCC)    Diabetes (HCC)    Diabetes mellitus without complication (HCC)    ESRD (end stage renal disease) on dialysis (HCC)    Heart failure (HCC)    History of CT scan    History of heart attack    History of MRI    History of stroke    HTN (hypertension)    Kidney failure    Osteoporosis    PEG (percutaneous endoscopic gastrostomy) status (HCC)    Sepsis (HCC) 04/01/2023   SIRS (systemic inflammatory response syndrome) (HCC) 04/06/2023   Thyroid  disease     Past Surgical History:  Procedure Laterality Date   ABDOMINAL HYSTERECTOMY     GALLBLADDER SURGERY     IR EXCHANGE BILIARY DRAIN  02/11/2023   IR EXCHANGE BILIARY DRAIN  04/13/2023   IR EXCHANGE BILIARY DRAIN  06/15/2023   IR EXCHANGE BILIARY DRAIN  08/23/2023   IR EXCHANGE BILIARY DRAIN  10/05/2023   IR EXCHANGE BILIARY DRAIN  11/30/2023   IR EXCHANGE BILIARY DRAIN  01/25/2024   IR EXCHANGE BILIARY DRAIN  03/21/2024   IR EXCHANGE BILIARY DRAIN  05/04/2024   IR EXCHANGE BILIARY DRAIN  06/29/2024   IR EXCHANGE BILIARY DRAIN  08/03/2024   IR PERC CHOLECYSTOSTOMY  12/16/2022   IR RADIOLOGIST EVAL & MGMT  09/28/2024   IR REPLACE G-TUBE SIMPLE WO FLUORO  08/23/2023   IR REPLC GASTRO/COLONIC TUBE PERCUT W/FLUORO  06/15/2023   IR REPLC GASTRO/COLONIC TUBE PERCUT W/FLUORO  10/05/2023   IR REPLC GASTRO/COLONIC TUBE PERCUT W/FLUORO  11/30/2023   IR REPLC GASTRO/COLONIC TUBE  PERCUT W/FLUORO  03/21/2024   IR REPLC GASTRO/COLONIC TUBE PERCUT W/FLUORO  08/03/2024    Allergies: Asa [aspirin] and Penicillins  Medications: Prior to Admission medications  Medication Sig Start Date End Date Taking? Authorizing Provider  acetaminophen  (TYLENOL ) 500 MG tablet Take 500 mg by mouth daily as needed for mild pain (pain score 1-3).    [provider]  atorvastatin  (LIPITOR) 10 MG tablet Take 1 tablet (10 mg total) by mouth daily. 09/07/24   Sherlynn Madden, MD  carvedilol  (COREG ) 3.125 MG tablet Take 3.125 mg Q AM  and 3.125 mg Q PM on non dialysis days (Tues, Thurs, Sat and Sun,  - On dialysis days,  MWF, continue Coreg  3.125 mg Q PM -  Hold for SBP < 100 05/02/24   Sherlynn Madden, MD  COVID-19 mRNA vaccine, Moderna, >/= 82yrs, (SPIKEVAX ) injection Inject 0.5 mLs into the muscle as directed. 05/02/24   Sherlynn Madden, MD  ELIQUIS  2.5 MG TABS tablet Take 1 tablet by mouth twice daily 08/14/24   Veludandi, Prashanthi, MD  Glucagon (BAQSIMI ONE PACK) 3 MG/DOSE POWD Place 1 packet into the nose as needed (For blood sugar). Use when blood sugar drops below 70.    [provider]  insulin  aspart (NOVOLOG ) 100 UNIT/ML injection 0-9 Units, Subcutaneous, 3 times daily  with meals, CBG < 70: Implement Hypoglycemia measures CBG 70 - 120: 0 units CBG 121 - 150: 1 unit CBG 151 - 200: 2 units CBG 201 - 250: 3 units CBG 251 - 300: 5 units CBG 301 - 350: 7 units CBG 351 - 400: 9 units CBG > 400: call MD 12/25/22   Raenelle Donalda HERO, MD  insulin  glargine (LANTUS ) 100 UNIT/ML Solostar Pen Inject 10 Units into the skin at bedtime. 09/07/24   Sherlynn Madden, MD  liver oil-zinc  oxide (DESITIN) 40 % ointment Apply topically 2 (two) times daily. 08/20/24   Patel, Pranav M, MD  LOKELMA 10 g PACK packet Take 1 packet by mouth as directed. Taken tues, thur, sat 04/12/24   [provider]  Methoxy PEG-Epoetin  Beta (MIRCERA IJ) 75 mcg as directed. Taken at dialysis  03/06/24 03/19/25  [provider]  Multiple Vitamins-Minerals (MULTIVITAMIN WITH MINERALS) tablet Take 1 tablet by mouth at bedtime.    [provider]  Nutritional Supplements (FEEDING SUPPLEMENT, OSMOLITE 1.5 CAL,) LIQD Place 600 mLs into feeding tube See admin instructions. 8p-6a    [provider]  polyethylene glycol powder (MIRALAX ) 17 GM/SCOOP powder Take 17 g by mouth 2 (two) times daily as needed for moderate constipation or mild constipation. 12/09/23   Gonfa, Taye T, MD  senna (SENOKOT) 8.6 MG TABS tablet Take 1 tablet by mouth at bedtime.    [provider]  simethicone (MYLICON) 125 MG chewable tablet Chew 125 mg by mouth 2 (two) times daily after a meal.    [provider]     Family History  Problem Relation Age of Onset   Diabetes Mother     Social History   Socioeconomic History   Marital status: Single    Spouse name: Not on file   Number of children: Not on file   Years of education: Not on file   Highest education level: Some college, no degree  Occupational History   Not on file  Tobacco Use   Smoking status: Never   Smokeless tobacco: Never  Vaping Use   Vaping status: Never Used  Substance and Sexual Activity   Alcohol use: Not Currently   Drug use: Never   Sexual activity: Not Currently  Other Topics Concern   Not on file  Social History Narrative   Tobacco use, amount per day now: N/A   Past tobacco use, amount per day: N/A   How many years did you use tobacco: N/A   Alcohol use (drinks per week): N/A   Diet: N/A   Do you drink/eat things with caffeine: N/A   Marital status:  Widow                                What year were you married? Yes   Do you live in a house, apartment, assisted living, condo, trailer, etc.? House    Is it one or more stories? One story    How many persons live in your home? 2    Do you have pets in your home?( please list) No   Highest Level of education completed? High School     Current or past profession: Environmental Health Practitioner    Do you exercise?    N/A                              Type  and how often?   Do you have a living will? No   Do you have a DNR form?    Yes                               If not, do you want to discuss one?   Do you have signed POA/HPOA forms?    No                    If so, please bring to you appointment      Do you have any difficulty bathing or dressing yourself? Yes   Do you have any difficulty preparing food or eating? Yes   Do you have any difficulty managing your medications? Yes   Do you have any difficulty managing your finances? Yes   Do you have any difficulty affording your medications? Yes   Social Drivers of Health   Tobacco Use: Low Risk (09/14/2024)   Patient History    Smoking Tobacco Use: Never    Smokeless Tobacco Use: Never    Passive Exposure: Not on file  Financial Resource Strain: Low Risk (06/20/2023)   Overall Financial Resource Strain (CARDIA)    Difficulty of Paying Living Expenses: Not hard at all  Food Insecurity: No Food Insecurity (08/19/2024)   Epic    Worried About Radiation Protection Practitioner of Food in the Last Year: Never true    Ran Out of Food in the Last Year: Never true  Transportation Needs: No Transportation Needs (08/19/2024)   Epic    Lack of Transportation (Medical): No    Lack of Transportation (Non-Medical): No  Physical Activity: Inactive (07/06/2024)   Exercise Vital Sign    Days of Exercise per Week: 0 days    Minutes of Exercise per Session: 0 min  Stress: No Stress Concern Present (07/06/2024)   [employer] of Occupational Health - Occupational Stress Questionnaire    Feeling of Stress: Only a little  Social Connections: Moderately Isolated (08/19/2024)   Social Connection and Isolation Panel    Frequency of Communication with Friends and Family: Three times a week    Frequency of Social Gatherings with Friends and Family: Twice a week    Attends Religious Services: 1 to 4 times  per year    Active Member of [employer] or Organizations: No    Attends Banker Meetings: Never    Marital Status: Widowed  Depression (PHQ2-9): Low Risk (07/25/2024)   Depression (PHQ2-9)    PHQ-2 Score: 0  Recent Concern: Depression (PHQ2-9) - Medium Risk (07/25/2024)   Depression (PHQ2-9)    PHQ-2 Score: 8  Alcohol Screen: Not on file  Housing: Low Risk (08/19/2024)   Epic    Unable to Pay for Housing in the Last Year: No    Number of Times Moved in the Last Year: 0    Homeless in the Last Year: No  Utilities: Not At Risk (08/19/2024)   Epic    Threatened with loss of utilities: No  Health Literacy: Inadequate Health Literacy (07/06/2024)   B1300 Health Literacy    Frequency of need for help with medical instructions: Always     Review of Systems  Vital Signs: There were no vitals taken for this visit.   Physical Exam      Imaging: IR Radiologist Eval & Mgmt Result Date: 09/28/2024 EXAM: NEW PATIENT VISIT CHIEF COMPLAINT: See below HISTORY OF PRESENT ILLNESS: See below  REVIEW OF SYSTEMS: See below PHYSICAL EXAMINATION: See below ASSESSMENT AND PLAN: Please refer to completed note in the electronic medical record on [facility] Thom Hall, MD [facility] Electronically Signed   By: Thom Hall M.D.   On: 09/28/2024 10:07   DG Chest Port 1 View Result Date: 09/08/2024 EXAM: 1 VIEW(S) XRAY OF THE CHEST 09/08/2024 03:36:36 PM COMPARISON: 08/18/2024 CLINICAL HISTORY: LH? LH? LH? FINDINGS: LINES, TUBES AND DEVICES: Stable right chest dialysis catheter with distal tip at the level of the proximal right atrium. LUNGS AND PLEURA: Low lung volumes. No focal pulmonary opacity. No pleural effusion. No pneumothorax. HEART AND MEDIASTINUM: No acute abnormality of the cardiac and mediastinal silhouettes. BONES AND SOFT TISSUES: Left axillary surgical clips again noted. No acute osseous abnormality. ABDOMEN: Paucity of  bowel gas. IMPRESSION: 1. No acute cardiopulmonary abnormality. 2. Stable right chest dialysis catheter with distal tip at the level of the proximal right atrium. Electronically signed by: Greig Pique MD 09/08/2024 03:50 PM EST RP Workstation: [id number]    Labs:  CBC: Recent Labs    08/18/24 1642 08/18/24 1727 08/19/24 0242 08/20/24 1127 09/08/24 1533  WBC 7.5  --  7.4 7.1 8.7  HGB 12.5 14.3  13.6 10.7* 11.1* 12.2  HCT 38.7 42.0  40.0 33.9* 34.3* 37.3  PLT 181  --  143* 144* 174    COAGS: Recent Labs    12/06/23 0730 12/07/23 0408 12/07/23 1521 12/08/23 0357 08/19/24 0242  INR 1.9*  --   --   --  1.4*  APTT  --  64* >200* >200*  --     BMP: Recent Labs    08/18/24 1642 08/18/24 1727 08/19/24 0242 08/20/24 1127 09/07/24 0927 09/08/24 1533  NA 134*   < > 133* 129* 134* 135  K 4.0   < > 4.5 5.2* 4.2 4.5  CL 93*   < > 95* 92* 91* 93*  CO2 29  --  27 23 36* 31  GLUCOSE 167*   < > 59* 232* 164* 142*  BUN 9   < > 15 31* 21 12  CALCIUM  8.3*  --  9.4 9.2 9.3 8.7*  CREATININE 1.60*   < > 2.16* 3.11* 2.56* 1.89*  GFRNONAA 33*  --  23* 15*  --  27*   < > = values in this interval not displayed.    LIVER FUNCTION TESTS: Recent Labs    03/05/24 1939 07/12/24 0000 08/18/24 1642 08/20/24 1127 09/08/24 1533  BILITOT 0.9  --  0.5 0.2 0.5  AST 25  --  39 33 44*  ALT 15  --  16 20 23   ALKPHOS 232* 138* 197* 153* 175*  PROT 6.7  --  7.8 6.2* 7.8  ALBUMIN 3.5 3.8 4.1 3.4* 4.1    TUMOR MARKERS: No results for input(s): AFPTM, CEA, CA199, CHROMGRNA in the last 8760 hours.  Assessment and Plan:  ***  Thank you for this interesting consult.  I greatly enjoyed meeting Samantha [name] and look forward to participating in their care.  A copy of this report was sent to the requesting provider on this date.  Electronically Signed: Katrianna Friesenhahn M Kolbie Clarkston 09/28/2024, 11:36 AM   I spent a total of 25 Minutes in clinical consultation, greater than 50% of which was  counseling/coordinating care for consideration of Spyglass procedure. "

## 2024-10-05 ENCOUNTER — Inpatient Hospital Stay: Admitting: Sports Medicine

## 2024-11-28 ENCOUNTER — Ambulatory Visit: Admitting: Sports Medicine

## 2024-11-30 ENCOUNTER — Other Ambulatory Visit (HOSPITAL_COMMUNITY)

## 2024-12-07 ENCOUNTER — Ambulatory Visit: Admitting: Sports Medicine
